# Patient Record
Sex: Female | Born: 1965 | Race: Black or African American | Hispanic: No | Marital: Single | Smoking: Current every day smoker
Health system: Southern US, Community
[De-identification: ages and names within clinical notes are randomized; demographics above are authoritative.]

## PROBLEM LIST (undated history)

## (undated) DIAGNOSIS — E042 Nontoxic multinodular goiter: Secondary | ICD-10-CM

## (undated) DIAGNOSIS — F329 Major depressive disorder, single episode, unspecified: Secondary | ICD-10-CM

## (undated) DIAGNOSIS — D649 Anemia, unspecified: Secondary | ICD-10-CM

## (undated) DIAGNOSIS — F32A Depression, unspecified: Secondary | ICD-10-CM

## (undated) DIAGNOSIS — J45909 Unspecified asthma, uncomplicated: Secondary | ICD-10-CM

## (undated) DIAGNOSIS — F419 Anxiety disorder, unspecified: Secondary | ICD-10-CM

## (undated) DIAGNOSIS — M797 Fibromyalgia: Secondary | ICD-10-CM

## (undated) DIAGNOSIS — Z923 Personal history of irradiation: Secondary | ICD-10-CM

## (undated) DIAGNOSIS — G459 Transient cerebral ischemic attack, unspecified: Secondary | ICD-10-CM

## (undated) DIAGNOSIS — M1990 Unspecified osteoarthritis, unspecified site: Secondary | ICD-10-CM

## (undated) DIAGNOSIS — E0500 Thyrotoxicosis with diffuse goiter without thyrotoxic crisis or storm: Secondary | ICD-10-CM

## (undated) DIAGNOSIS — M7918 Myalgia, other site: Secondary | ICD-10-CM

## (undated) DIAGNOSIS — K219 Gastro-esophageal reflux disease without esophagitis: Secondary | ICD-10-CM

## (undated) DIAGNOSIS — T7840XA Allergy, unspecified, initial encounter: Secondary | ICD-10-CM

## (undated) HISTORY — DX: Unspecified osteoarthritis, unspecified site: M19.90

## (undated) HISTORY — PX: HYDRADENITIS EXCISION: SHX5243

## (undated) HISTORY — DX: Major depressive disorder, single episode, unspecified: F32.9

## (undated) HISTORY — DX: Depression, unspecified: F32.A

## (undated) HISTORY — DX: Nontoxic multinodular goiter: E04.2

## (undated) HISTORY — DX: Allergy, unspecified, initial encounter: T78.40XA

## (undated) HISTORY — DX: Fibromyalgia: M79.7

## (undated) HISTORY — DX: Myalgia, other site: M79.18

---

## 1993-06-24 HISTORY — PX: TUBAL LIGATION: SHX77

## 1999-06-25 HISTORY — PX: AXILLARY LYMPH NODE DISSECTION: SHX5229

## 2005-06-28 ENCOUNTER — Emergency Department (HOSPITAL_COMMUNITY): Admission: EM | Admit: 2005-06-28 | Discharge: 2005-06-28 | Payer: Self-pay | Admitting: Emergency Medicine

## 2005-09-30 ENCOUNTER — Emergency Department (HOSPITAL_COMMUNITY): Admission: EM | Admit: 2005-09-30 | Discharge: 2005-10-01 | Payer: Self-pay | Admitting: Emergency Medicine

## 2006-01-14 ENCOUNTER — Emergency Department (HOSPITAL_COMMUNITY): Admission: EM | Admit: 2006-01-14 | Discharge: 2006-01-14 | Payer: Self-pay | Admitting: Emergency Medicine

## 2006-03-26 ENCOUNTER — Encounter: Admission: RE | Admit: 2006-03-26 | Discharge: 2006-03-26 | Payer: Self-pay | Admitting: Endocrinology

## 2006-09-20 ENCOUNTER — Emergency Department (HOSPITAL_COMMUNITY): Admission: EM | Admit: 2006-09-20 | Discharge: 2006-09-20 | Payer: Self-pay | Admitting: Emergency Medicine

## 2007-02-03 ENCOUNTER — Emergency Department (HOSPITAL_COMMUNITY): Admission: EM | Admit: 2007-02-03 | Discharge: 2007-02-03 | Payer: Self-pay | Admitting: Emergency Medicine

## 2007-10-27 ENCOUNTER — Emergency Department (HOSPITAL_COMMUNITY): Admission: EM | Admit: 2007-10-27 | Discharge: 2007-10-27 | Payer: Self-pay | Admitting: Emergency Medicine

## 2008-01-11 ENCOUNTER — Emergency Department (HOSPITAL_COMMUNITY): Admission: EM | Admit: 2008-01-11 | Discharge: 2008-01-11 | Payer: Self-pay | Admitting: Emergency Medicine

## 2008-01-25 ENCOUNTER — Encounter: Admission: RE | Admit: 2008-01-25 | Discharge: 2008-01-25 | Payer: Self-pay | Admitting: Family Medicine

## 2008-02-24 ENCOUNTER — Emergency Department (HOSPITAL_COMMUNITY): Admission: EM | Admit: 2008-02-24 | Discharge: 2008-02-24 | Payer: Self-pay | Admitting: Emergency Medicine

## 2008-03-28 ENCOUNTER — Encounter: Admission: RE | Admit: 2008-03-28 | Discharge: 2008-06-01 | Payer: Self-pay | Admitting: Family Medicine

## 2008-05-03 ENCOUNTER — Emergency Department (HOSPITAL_COMMUNITY): Admission: EM | Admit: 2008-05-03 | Discharge: 2008-05-04 | Payer: Self-pay | Admitting: Emergency Medicine

## 2008-09-16 ENCOUNTER — Emergency Department (HOSPITAL_COMMUNITY): Admission: EM | Admit: 2008-09-16 | Discharge: 2008-09-16 | Payer: Self-pay | Admitting: Family Medicine

## 2008-09-17 ENCOUNTER — Emergency Department (HOSPITAL_COMMUNITY): Admission: EM | Admit: 2008-09-17 | Discharge: 2008-09-17 | Payer: Self-pay | Admitting: Family Medicine

## 2008-09-22 ENCOUNTER — Encounter (INDEPENDENT_AMBULATORY_CARE_PROVIDER_SITE_OTHER): Payer: Self-pay | Admitting: *Deleted

## 2008-09-22 ENCOUNTER — Ambulatory Visit: Payer: Self-pay | Admitting: Family Medicine

## 2008-09-22 DIAGNOSIS — F39 Unspecified mood [affective] disorder: Secondary | ICD-10-CM | POA: Insufficient documentation

## 2008-09-22 DIAGNOSIS — E119 Type 2 diabetes mellitus without complications: Secondary | ICD-10-CM | POA: Insufficient documentation

## 2008-09-22 LAB — CONVERTED CEMR LAB: Hgb A1c MFr Bld: 10.1 %

## 2008-09-26 ENCOUNTER — Encounter: Payer: Self-pay | Admitting: Family Medicine

## 2008-09-26 ENCOUNTER — Ambulatory Visit: Payer: Self-pay | Admitting: Family Medicine

## 2008-09-27 ENCOUNTER — Ambulatory Visit: Payer: Self-pay | Admitting: Family Medicine

## 2008-09-27 DIAGNOSIS — E669 Obesity, unspecified: Secondary | ICD-10-CM | POA: Insufficient documentation

## 2008-09-27 DIAGNOSIS — E6609 Other obesity due to excess calories: Secondary | ICD-10-CM | POA: Insufficient documentation

## 2008-09-28 ENCOUNTER — Encounter: Payer: Self-pay | Admitting: Family Medicine

## 2008-09-28 LAB — CONVERTED CEMR LAB
ALT: 11 units/L (ref 0–35)
AST: 10 units/L (ref 0–37)
Albumin: 3.6 g/dL (ref 3.5–5.2)
Alkaline Phosphatase: 98 units/L (ref 39–117)
BUN: 16 mg/dL (ref 6–23)
CO2: 21 meq/L (ref 19–32)
Calcium: 8.8 mg/dL (ref 8.4–10.5)
Chloride: 107 meq/L (ref 96–112)
Cholesterol: 135 mg/dL (ref 0–200)
Creatinine, Ser: 0.8 mg/dL (ref 0.40–1.20)
Free T4: 0.94 ng/dL (ref 0.89–1.80)
Glucose, Bld: 211 mg/dL — ABNORMAL HIGH (ref 70–99)
HDL: 41 mg/dL (ref >39)
LDL Cholesterol: 62 mg/dL (ref 0–99)
Potassium: 4.1 meq/L (ref 3.5–5.3)
Sodium: 139 meq/L (ref 135–145)
T3, Free: 2.7 pg/mL (ref 2.3–4.2)
TSH: 0.832 microintl units/mL (ref 0.350–4.500)
Total Bilirubin: 0.3 mg/dL (ref 0.3–1.2)
Total CHOL/HDL Ratio: 3.3
Total Protein: 6.3 g/dL (ref 6.0–8.3)
Triglycerides: 160 mg/dL — ABNORMAL HIGH (ref <150)
VLDL: 32 mg/dL (ref 0–40)

## 2008-10-10 ENCOUNTER — Ambulatory Visit: Payer: Self-pay | Admitting: Family Medicine

## 2008-10-10 ENCOUNTER — Encounter: Payer: Self-pay | Admitting: *Deleted

## 2008-10-12 ENCOUNTER — Encounter: Admission: RE | Admit: 2008-10-12 | Discharge: 2009-01-10 | Payer: Self-pay | Admitting: Emergency Medicine

## 2008-10-21 ENCOUNTER — Telehealth (INDEPENDENT_AMBULATORY_CARE_PROVIDER_SITE_OTHER): Payer: Self-pay | Admitting: *Deleted

## 2008-10-24 ENCOUNTER — Ambulatory Visit: Payer: Self-pay | Admitting: Family Medicine

## 2008-11-01 ENCOUNTER — Telehealth: Payer: Self-pay | Admitting: Family Medicine

## 2008-12-18 ENCOUNTER — Emergency Department (HOSPITAL_COMMUNITY): Admission: EM | Admit: 2008-12-18 | Discharge: 2008-12-18 | Payer: Self-pay | Admitting: Family Medicine

## 2008-12-28 ENCOUNTER — Ambulatory Visit: Payer: Self-pay | Admitting: Family Medicine

## 2008-12-28 LAB — CONVERTED CEMR LAB: Hgb A1c MFr Bld: 6.4 %

## 2009-01-06 ENCOUNTER — Telehealth: Payer: Self-pay | Admitting: *Deleted

## 2009-01-09 ENCOUNTER — Ambulatory Visit: Payer: Self-pay | Admitting: Family Medicine

## 2009-01-09 DIAGNOSIS — M549 Dorsalgia, unspecified: Secondary | ICD-10-CM | POA: Insufficient documentation

## 2009-01-10 ENCOUNTER — Encounter: Admission: RE | Admit: 2009-01-10 | Discharge: 2009-04-10 | Payer: Self-pay | Admitting: Emergency Medicine

## 2009-02-03 ENCOUNTER — Encounter: Payer: Self-pay | Admitting: Family Medicine

## 2009-03-06 ENCOUNTER — Ambulatory Visit: Payer: Self-pay | Admitting: Family Medicine

## 2009-03-11 ENCOUNTER — Encounter (INDEPENDENT_AMBULATORY_CARE_PROVIDER_SITE_OTHER): Payer: Self-pay | Admitting: *Deleted

## 2009-03-11 DIAGNOSIS — F17200 Nicotine dependence, unspecified, uncomplicated: Secondary | ICD-10-CM | POA: Insufficient documentation

## 2009-03-14 ENCOUNTER — Ambulatory Visit: Payer: Self-pay | Admitting: Family Medicine

## 2009-03-22 ENCOUNTER — Emergency Department (HOSPITAL_COMMUNITY): Admission: EM | Admit: 2009-03-22 | Discharge: 2009-03-23 | Payer: Self-pay | Admitting: Emergency Medicine

## 2009-03-24 ENCOUNTER — Telehealth: Payer: Self-pay | Admitting: Family Medicine

## 2009-03-28 ENCOUNTER — Ambulatory Visit: Payer: Self-pay | Admitting: Family Medicine

## 2009-03-28 ENCOUNTER — Encounter: Payer: Self-pay | Admitting: *Deleted

## 2009-05-09 ENCOUNTER — Ambulatory Visit: Payer: Self-pay | Admitting: Family Medicine

## 2009-05-09 LAB — CONVERTED CEMR LAB: Hgb A1c MFr Bld: 6.2 %

## 2009-05-24 ENCOUNTER — Ambulatory Visit: Payer: Self-pay | Admitting: Family Medicine

## 2009-07-21 ENCOUNTER — Emergency Department (HOSPITAL_COMMUNITY): Admission: EM | Admit: 2009-07-21 | Discharge: 2009-07-21 | Payer: Self-pay | Admitting: Emergency Medicine

## 2009-09-08 ENCOUNTER — Encounter: Payer: Self-pay | Admitting: Family Medicine

## 2009-09-08 ENCOUNTER — Ambulatory Visit: Payer: Self-pay | Admitting: Family Medicine

## 2009-09-08 DIAGNOSIS — M25512 Pain in left shoulder: Secondary | ICD-10-CM | POA: Insufficient documentation

## 2009-09-08 LAB — CONVERTED CEMR LAB: Hgb A1c MFr Bld: 6.1 %

## 2009-09-22 ENCOUNTER — Encounter: Payer: Self-pay | Admitting: Family Medicine

## 2009-09-22 ENCOUNTER — Ambulatory Visit: Payer: Self-pay | Admitting: Family Medicine

## 2009-09-22 DIAGNOSIS — E049 Nontoxic goiter, unspecified: Secondary | ICD-10-CM | POA: Insufficient documentation

## 2009-09-22 DIAGNOSIS — J309 Allergic rhinitis, unspecified: Secondary | ICD-10-CM | POA: Insufficient documentation

## 2009-09-22 LAB — CONVERTED CEMR LAB: TSH: 1.047 microintl units/mL (ref 0.350–4.500)

## 2009-09-28 ENCOUNTER — Ambulatory Visit (HOSPITAL_COMMUNITY): Admission: RE | Admit: 2009-09-28 | Discharge: 2009-09-28 | Payer: Self-pay | Admitting: Family Medicine

## 2009-10-18 ENCOUNTER — Telehealth: Payer: Self-pay | Admitting: Family Medicine

## 2009-10-19 ENCOUNTER — Ambulatory Visit: Payer: Self-pay | Admitting: Family Medicine

## 2009-10-26 ENCOUNTER — Ambulatory Visit: Payer: Self-pay | Admitting: Family Medicine

## 2009-11-08 ENCOUNTER — Telehealth: Payer: Self-pay | Admitting: Family Medicine

## 2009-11-09 ENCOUNTER — Ambulatory Visit: Payer: Self-pay | Admitting: Family Medicine

## 2009-11-15 ENCOUNTER — Encounter: Payer: Self-pay | Admitting: Family Medicine

## 2009-11-30 ENCOUNTER — Encounter: Payer: Self-pay | Admitting: Family Medicine

## 2009-12-07 ENCOUNTER — Ambulatory Visit: Payer: Self-pay | Admitting: Family Medicine

## 2009-12-07 ENCOUNTER — Encounter: Payer: Self-pay | Admitting: Family Medicine

## 2009-12-07 ENCOUNTER — Telehealth: Payer: Self-pay | Admitting: Family Medicine

## 2009-12-07 DIAGNOSIS — M79609 Pain in unspecified limb: Secondary | ICD-10-CM | POA: Insufficient documentation

## 2009-12-07 DIAGNOSIS — M2550 Pain in unspecified joint: Secondary | ICD-10-CM | POA: Insufficient documentation

## 2009-12-07 LAB — CONVERTED CEMR LAB
ALT: 11 units/L (ref 0–35)
AST: 10 units/L (ref 0–37)
Albumin: 4.1 g/dL (ref 3.5–5.2)
Alkaline Phosphatase: 90 units/L (ref 39–117)
BUN: 14 mg/dL (ref 6–23)
CO2: 28 meq/L (ref 19–32)
Calcium: 9.1 mg/dL (ref 8.4–10.5)
Chloride: 104 meq/L (ref 96–112)
Cholesterol: 129 mg/dL (ref 0–200)
Creatinine, Ser: 0.93 mg/dL (ref 0.40–1.20)
Glucose, Bld: 115 mg/dL — ABNORMAL HIGH (ref 70–99)
HDL: 45 mg/dL (ref >39)
Hgb A1c MFr Bld: 6.3 %
LDL Cholesterol: 62 mg/dL (ref 0–99)
Potassium: 4.8 meq/L (ref 3.5–5.3)
Sodium: 139 meq/L (ref 135–145)
Total Bilirubin: 0.2 mg/dL — ABNORMAL LOW (ref 0.3–1.2)
Total CHOL/HDL Ratio: 2.9
Total Protein: 7.2 g/dL (ref 6.0–8.3)
Triglycerides: 110 mg/dL (ref <150)
Uric Acid, Serum: 5.7 mg/dL (ref 2.4–7.0)
VLDL: 22 mg/dL (ref 0–40)

## 2009-12-08 ENCOUNTER — Encounter: Payer: Self-pay | Admitting: Family Medicine

## 2009-12-26 ENCOUNTER — Encounter: Admission: RE | Admit: 2009-12-26 | Discharge: 2009-12-26 | Payer: Self-pay | Admitting: Family Medicine

## 2009-12-26 ENCOUNTER — Ambulatory Visit: Payer: Self-pay | Admitting: Family Medicine

## 2009-12-26 ENCOUNTER — Telehealth: Payer: Self-pay | Admitting: Family Medicine

## 2009-12-27 ENCOUNTER — Telehealth: Payer: Self-pay | Admitting: Family Medicine

## 2009-12-28 ENCOUNTER — Telehealth: Payer: Self-pay | Admitting: Family Medicine

## 2010-01-03 ENCOUNTER — Telehealth: Payer: Self-pay | Admitting: Family Medicine

## 2010-01-18 ENCOUNTER — Ambulatory Visit: Payer: Self-pay | Admitting: Family Medicine

## 2010-01-18 ENCOUNTER — Telehealth: Payer: Self-pay | Admitting: Family Medicine

## 2010-03-19 ENCOUNTER — Encounter: Payer: Self-pay | Admitting: Family Medicine

## 2010-03-30 ENCOUNTER — Ambulatory Visit: Payer: Self-pay | Admitting: Family Medicine

## 2010-05-04 ENCOUNTER — Ambulatory Visit: Payer: Self-pay | Admitting: Family Medicine

## 2010-05-04 ENCOUNTER — Encounter: Payer: Self-pay | Admitting: *Deleted

## 2010-05-04 DIAGNOSIS — G43709 Chronic migraine without aura, not intractable, without status migrainosus: Secondary | ICD-10-CM | POA: Insufficient documentation

## 2010-06-01 ENCOUNTER — Ambulatory Visit: Payer: Self-pay | Admitting: Family Medicine

## 2010-06-01 LAB — CONVERTED CEMR LAB: Hgb A1c MFr Bld: 8.4 %

## 2010-07-24 NOTE — Assessment & Plan Note (Signed)
Summary: back / flank pain   Vital Signs:  Patient profile:   45 year old female Height:      63.5 inches Weight:      195.7 pounds BMI:     34.25 Temp:     98.2 degrees F oral Pulse rate:   96 / minute BP sitting:   133 / 83  (left arm) Cuff size:   large  Vitals Entered By: Garen Grams LPN (January 09, 2009 9:36 AM) CC: back pain / flank pain Pain Assessment Patient in pain? yes     Location: back   Primary Care Provider:  Ancil Boozer  MD  CC:  back pain / flank pain.  History of Present Illness: 45yr old presents with:  1) R back pain - c/o chronic back pain. Was improved over the last few weeks but worsened last week. Usually takes muscles relaxants but had stopped because she was doing well.  Started back to 5/10 last week in her same spot, R lumbar region. No sciatica. No leg weakness.  No fevers. no dysuria. Today, is slightly better. 4/10 and feels like her typical pain. has lost 16lbs by walking and changing her diet.   2) R flank pain - Reports constipation all last week and R flank pain. No vomiting. No fevers. Was taking Vicodin for back pain as above.  Saturday had a few alcoholic drinks and "emptied her colon" and immediately felt better. No blood.  No flank pain since.  No abd pain, diarrhea, of nausea today.   Current Medications (verified): 1)  Topamax 25 Mg Tabs (Topiramate) .Marland Kitchen.. 1 Tablet By Mouth Three Times A Day 2)  Methocarbamol 750 Mg Tabs (Methocarbamol) .Marland Kitchen.. 1 Tablet By Mouth Three Times A Day 3)  Nabumetone 500 Mg Tabs (Nabumetone) .Marland Kitchen.. 1 Tablet 2-3 Times Daily With Food 4)  Fexofenadine Hcl 180 Mg Tabs (Fexofenadine Hcl) .Marland Kitchen.. 1 Tablet By Mouth Once Daily 5)  Promethazine Hcl 25 Mg Tabs (Promethazine Hcl) .Marland Kitchen.. 1 Tablet By Mouth As Needed 6)  Alprazolam 0.5 Mg Tabs (Alprazolam) .Marland Kitchen.. 1 Tablet By Mouth As Needed 7)  Metformin Hcl 500 Mg Tabs (Metformin Hcl) .... Titrate Up As Directed To Goal 2 At [facility] and 2 At Midnight For Diabetes 8)  Nexium 40 Mg  Cpdr (Esomeprazole Magnesium) .... Take 1 Tab Each Morning 9)  Prodigy Pocket Blood Glucose W/device Kit (Blood Glucose Monitoring Suppl) .... Disp #1 Glucometer Kit.  Please Also Dispense Lancets #100 For Kit and Glucose Test Strips For Testing Once Daily, 1 Mo Supply. 10)  Oxycodone-Acetaminophen 5-325 Mg Tabs (Oxycodone-Acetaminophen) .Marland Kitchen.. 1 By Mouth Two Times A Day As Needed Severe Pain 11)  Proair Hfa 108 (90 Base) Mcg/act Aers (Albuterol Sulfate) .... 2 Puffs Every 4 Hours As Needed For Wheezing/asthma Flare 12)  Promethazine Hcl 25 Mg Tabs (Promethazine Hcl) .... 1/2 - 1 By Mouth Q8hr As Needed Nausea.  Allergies (verified): No Known Drug Allergies  Physical Exam  General:  obese, cooperative, NAD, easily gets on and off exam today  Mouth:  moist mucous membranes Abdomen:  +BS, obese, NTND, no masses Msk:  no midline ttp. mild R lumbar paraspinous tightness with tenderness. no buttocks tenderness. Neg SLR bilaterally. FROM hips B. FROM of back. normal sensation in legs. 2+ reflexes patellar and achilles   Impression & Recommendations:  Problem # 1:  LOW BACK PAIN, CHRONIC (ICD-724.2) Assessment Unchanged  Has returned to her baseline. Likely exacerbated last week by constipation and immobility. See instructions. No  red flags on exam. Red flags given.  Her updated medication list for this problem includes:    Methocarbamol 750 Mg Tabs (Methocarbamol) .Marland Kitchen... 1 tablet by mouth three times a day    Nabumetone 500 Mg Tabs (Nabumetone) .Marland Kitchen... 1 tablet 2-3 times daily with food    Oxycodone-acetaminophen 5-325 Mg Tabs (Oxycodone-acetaminophen) .Marland Kitchen... 1 by mouth two times a day as needed severe pain  Orders: [facility]- Est Level  3 (99213)  Problem # 2:  CONSTIPATION (ICD-564.00) Assessment: New  Improving. Advised fruits/veggies, push fluids, yogurt, stool softner, and watch narcotic use. See instructions.   Orders: [facility]- Est Level  3 (19147)  Complete Medication List: 1)  Topamax 25  Mg Tabs (Topiramate) .Marland Kitchen.. 1 tablet by mouth three times a day 2)  Methocarbamol 750 Mg Tabs (Methocarbamol) .Marland Kitchen.. 1 tablet by mouth three times a day 3)  Nabumetone 500 Mg Tabs (Nabumetone) .Marland Kitchen.. 1 tablet 2-3 times daily with food 4)  Fexofenadine Hcl 180 Mg Tabs (Fexofenadine hcl) .Marland Kitchen.. 1 tablet by mouth once daily 5)  Promethazine Hcl 25 Mg Tabs (Promethazine hcl) .Marland Kitchen.. 1 tablet by mouth as needed 6)  Alprazolam 0.5 Mg Tabs (Alprazolam) .Marland Kitchen.. 1 tablet by mouth as needed 7)  Metformin Hcl 500 Mg Tabs (Metformin hcl) .... Titrate up as directed to goal 2 at noon and 2 at midnight for diabetes 8)  Nexium 40 Mg Cpdr (Esomeprazole magnesium) .... Take 1 tab each morning 9)  Prodigy Pocket Blood Glucose W/device Kit (Blood glucose monitoring suppl) .... Disp #1 glucometer kit.  please also dispense lancets #100 for kit and glucose test strips for testing once daily, 1 mo supply. 10)  Oxycodone-acetaminophen 5-325 Mg Tabs (Oxycodone-acetaminophen) .Marland Kitchen.. 1 by mouth two times a day as needed severe pain 11)  Proair Hfa 108 (90 Base) Mcg/act Aers (Albuterol sulfate) .... 2 puffs every 4 hours as needed for wheezing/asthma flare 12)  Promethazine Hcl 25 Mg Tabs (Promethazine hcl) .... 1/2 - 1 by mouth q8hr as needed nausea.  Patient Instructions: 1)  When you take the Vicodin, drink an extra large glass of water and take your Dulcolax with it.  2)  Keep walking. I'm SO impressed with your weight loss. Keep it up. 3)  If your belly pain comes back, let us know. 4)  Daily yogurt will help regulate your bowels.

## 2010-07-24 NOTE — Assessment & Plan Note (Signed)
Summary: FU/KH   Vital Signs:  Patient profile:   45 year old female Height:      63.5 inches Weight:      196.9 pounds BMI:     34.46 Temp:     98.1 degrees F oral Pulse rate:   83 / minute BP sitting:   121 / 78  (right arm) Cuff size:   large  Vitals Entered By: Garen Grams LPN (December 28, 1189 2:11 PM) CC: f/u dm, tooth pain   Primary Care Provider:  Ancil Boozer  MD  CC:  f/u dm and tooth pain.  History of Present Illness: DM: has been working very hard to lose weight (down 15 lbs) and following nutrition recommendations.  actually stopped taking glyburide as it made her feel poorly but is taking metformin as prescribed.  is watching her feet as well.  she states overall she is feeling better.  she doesn't check her sugars very regularly but hasn't noticed anything >200 or <80. she is very excited about her improvement in her A1C.  she knows she is due for eye doctor appt given her diabetes.  tooth pain:  has had chronic issues but had difficulty getting in with dentist 2/2 insurance reasons and costs.  for past several weeks has had severe pain in right upper and lower molars 2/2 cavities.  no swelling or fevers or drainage.  has been taking some old leftover percocet from orthopedist and just ran out 1.5 weeks ago.  would like a refill to try to help her pain until she can get in to see a dentist.  Current Medications (verified): 1)  Topamax 25 Mg Tabs (Topiramate) .Marland Kitchen.. 1 Tablet By Mouth Three Times A Day 2)  Methocarbamol 750 Mg Tabs (Methocarbamol) .Marland Kitchen.. 1 Tablet By Mouth Three Times A Day 3)  Nabumetone 500 Mg Tabs (Nabumetone) .Marland Kitchen.. 1 Tablet 2-3 Times Daily With Food 4)  Fexofenadine Hcl 180 Mg Tabs (Fexofenadine Hcl) .Marland Kitchen.. 1 Tablet By Mouth Once Daily 5)  Promethazine Hcl 25 Mg Tabs (Promethazine Hcl) .Marland Kitchen.. 1 Tablet By Mouth As Needed 6)  Alprazolam 0.5 Mg Tabs (Alprazolam) .Marland Kitchen.. 1 Tablet By Mouth As Needed 7)  Metformin Hcl 500 Mg Tabs (Metformin Hcl) .... Titrate Up As  Directed To Goal 2 At [facility] and 2 At Midnight For Diabetes 8)  Nexium 40 Mg Cpdr (Esomeprazole Magnesium) .... Take 1 Tab Each Morning 9)  Prodigy Pocket Blood Glucose W/device Kit (Blood Glucose Monitoring Suppl) .... Disp #1 Glucometer Kit.  Please Also Dispense Lancets #100 For Kit and Glucose Test Strips For Testing Once Daily, 1 Mo Supply. 10)  Oxycodone-Acetaminophen 5-325 Mg Tabs (Oxycodone-Acetaminophen) .Marland Kitchen.. 1 By Mouth Two Times A Day As Needed Severe Pain 11)  Proair Hfa 108 (90 Base) Mcg/act Aers (Albuterol Sulfate) .... 2 Puffs Every 4 Hours As Needed For Wheezing/asthma Flare  Allergies (verified): No Known Drug Allergies  Past History:  Past medical, surgical, family and social histories (including risk factors) reviewed for relevance to current acute and chronic problems.  Past Medical History: Reviewed history from 09/26/2008 and no changes required. 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 Tubal ligation-1995 Axillary sweat glands and lymph nodes removed bilaterally 2001  Family History: Reviewed history from 09/26/2008 and no changes required. Rectal Cancer - mother Family History Breast cancer 1st degree relative <50- sister age 25 Family History of CAD Female 1st degree relative <50- father MI x5  Family History Diabetes 1st degree relative- parents and sister family history of hypertenion  Social History: Reviewed history from 09/26/2008 and no changes required. Pt lives with her daughter and son ages 84 and 40, and grandson age 45.  She is currently  unemployed.  Enjoys reading.  [place]iginally from [place].  moved here to [place] in 2007.  disabled 2/2 depression/anxiety  Review of Systems       per HPI.    Physical Exam  General:  obese, cooperative, NAD Mouth:  poor dentition with significant dental work in the past.  cavities and breakdown noted on lower and upper molars on R side.  no signs of infection at this time. Lungs:  Normal respiratory effort, chest expands symmetrically. Lungs are clear to auscultation, no crackles or wheezes. Heart:  Normal rate and regular rhythm. S1 and S2 normal without gallop, murmur, click, rub or other extra sounds.   Impression & Recommendations:  Problem # 1:  DIABETES MELLITUS, TYPE II (ICD-250.00) Assessment Improved continue current treatment as we have made excellent progress.  optho referral  The following medications were removed from the medication list:    Glyburide 5 Mg Tabs (Glyburide) .Marland Kitchen... 1/2 tablet by mouth once daily for diabetes Her updated medication list for this problem includes:    Metformin Hcl 500 Mg Tabs (Metformin hcl) .Marland Kitchen... Titrate up as directed to goal 2 at noon and 2 at midnight for diabetes  Orders: A1C-[facility] (16109) Ophthalmology Referral (Ophthalmology) [facility]- Est  Level 4 (60454)  Problem # 2:  DENTAL CARIES (ICD-521.00) Assessment: Deteriorated  needs dentist appt ultimately.  no signs of infection currently.  given script for pain medication until then.  Orders: [facility]- Est  Level 4 (09811)  Complete Medication List: 1)  Topamax 25 Mg Tabs (Topiramate) .Marland Kitchen.. 1 tablet by mouth three times a day 2)  Methocarbamol 750 Mg Tabs (Methocarbamol) .Marland Kitchen.. 1 tablet by mouth three times a day 3)  Nabumetone 500 Mg Tabs (Nabumetone) .Marland Kitchen.. 1 tablet 2-3 times daily with food 4)  Fexofenadine Hcl 180 Mg Tabs (Fexofenadine hcl) .Marland Kitchen.. 1 tablet by mouth once daily 5)  Promethazine Hcl 25 Mg Tabs (Promethazine hcl) .Marland Kitchen.. 1 tablet by mouth as needed 6)  Alprazolam 0.5 Mg Tabs  (Alprazolam) .Marland Kitchen.. 1 tablet by mouth as needed 7)  Metformin Hcl 500 Mg Tabs (Metformin hcl) .... Titrate up as directed to goal 2 at noon and 2 at midnight for diabetes 8)  Nexium 40 Mg Cpdr (Esomeprazole magnesium) .... Take 1 tab each morning 9)  Prodigy Pocket Blood Glucose W/device Kit (Blood glucose monitoring suppl) .... Disp #1 glucometer kit.  please also dispense lancets #100 for kit and glucose test strips for testing once daily, 1 mo supply. 10)  Oxycodone-acetaminophen 5-325 Mg Tabs (Oxycodone-acetaminophen) .Marland Kitchen.. 1 by mouth two times a day as needed severe pain 11)  Proair Hfa 108 (90 Base) Mcg/act Aers (Albuterol sulfate) .... 2 puffs every 4 hours as needed for wheezing/asthma flare  Patient Instructions: 1)  Please follow up in 3 months or of course sooner if needed. 2)  Unfortunately you are just going to have to call around for a dentist to see if they take  Medicare here in town as we don't have a specific list.  Until then we can get you some pain medication for your tooth. 3)  I am very proud of you with your weight loss and with your improvement in your diabetes!  Keep up the good work! Your A1C today was 6.4%!!!!!! 4)  We are going to put in a referral to an eye doctor for your diabetic eye exam. Prescriptions: PROAIR HFA 108 (90 BASE) MCG/ACT AERS (ALBUTEROL SULFATE) 2 puffs every 4 hours as needed for wheezing/asthma flare  #1 x 11   Entered and Authorized by:   Ancil Boozer  MD   Signed by:   Ancil Boozer  MD on 12/28/2008   Method used:   Electronically to        [facility] [phone]* (retail)       [address]       [place], [place]  [address]       Ph: [phone] or [phone]       Fax: [phone]   RxID:   [id number] OXYCODONE-ACETAMINOPHEN 5-325 MG TABS (OXYCODONE-ACETAMINOPHEN) 1 by mouth two times a day as needed severe pain  #60 x 0   Entered and Authorized by:   Ancil Boozer  MD   Signed by:   Ancil Boozer  MD on  12/28/2008   Method used:   Print then Give to Patient   RxID:   [id number]   Laboratory Results   Blood Tests   Date/Time Received: December 28, 2008 2:10 PM  Date/Time Reported: December 28, 2008 2:25 PM   HGBA1C: 6.4%   (Normal Range: Non-Diabetic - 3-6%   Control Diabetic - 6-8%)  Comments: ...........test performed by...........Marland KitchenTerese Door, CMA

## 2010-07-24 NOTE — Assessment & Plan Note (Signed)
Summary: f/up,tcb   Vital Signs:  Patient profile:   45 year old female Height:      63.5 inches Weight:      201 pounds BMI:     35.17 Temp:     98.8 degrees F Pulse rate:   84 / minute BP sitting:   120 / 81  (left arm) Cuff size:   large  Vitals Entered By: Dennison Nancy RN (January 18, 2010 11:18 AM) CC: Follow up neck and arm pain Is Patient Diabetic? Yes Pain Assessment Patient in pain? yes     Location: right neck/shoulder Intensity: 9 Onset of pain  Chronic   Primary Care Provider:  Bobby Rumpf  MD  CC:  Follow up neck and arm pain.  History of Present Illness: 1) Right shoulder pain: Seen on 12/26/09 for right shoulder pain x 3 days. Pain is located over trapezius and feels like "tight and sore" and "like a big knot". Worse with movement. Radiates up to right side of neck and down right lateral arm. Denies parasthesias or weakness. Neck films showed moderate to severe degenerative disc disease at C5-C6. Pain not relieved by oxycodone or methocarbamol for her chronic back pain - this was switched to flexeril which did not relieve pain. Ice has not helped.   Denies inciting injury, chest pain, dyspnea, parasthesias, weakness.  2) Seasonal allergies: worse in summer and spring. itchy, runny eyes; sneezing, runny nose. worse with pollen. on allegra D in past but unable to afford. on zyrtec in past but unable to afford. denies cough, sore throat, fever, chills, nausea, vomiting.   Med rec see prior meds but - not on skelaxin, not on cetirizine, takes metformin and lisinopril intermittently Allergies reviewed as as below ROS as above otherwise negative except for chrionic back pain at baseline    Habits & Providers  Alcohol-Tobacco-Diet     Alcohol drinks/day: 0     Tobacco Status: current     Tobacco Counseling: to quit use of tobacco products     Cigarette Packs/Day: 0.25  Medications Prior to Update: 1)  Topamax 25 Mg Tabs (Topiramate) .Marland Kitchen.. 1 Tablet By Mouth Three  Times A Day For Headache Prevention 2)  Nabumetone 500 Mg Tabs (Nabumetone) .Marland Kitchen.. 1 Tablet 2-3 Times Daily With Food 3)  Cetirizine-Pseudoephedrine 5-120 Mg Xr12h-Tab (Cetirizine-Pseudoephedrine) .Marland Kitchen.. 1 By Mouth Once Daily For Allergies/congestion 4)  Metformin Hcl 500 Mg Tabs (Metformin Hcl) .... 2 By Mouth Two Times A Day For Diabetes Control. 5)  Nexium 40 Mg Cpdr (Esomeprazole Magnesium) .... Take 1 Tab Each Morning 6)  Prodigy Pocket Blood Glucose W/device Kit (Blood Glucose Monitoring Suppl) .... Disp #1 Glucometer Kit.  Please Also Dispense Lancets #100 For Kit and Glucose Test Strips For Testing Once Daily, 1 Mo Supply. 7)  Oxycodone-Acetaminophen 5-325 Mg Tabs (Oxycodone-Acetaminophen) .Marland Kitchen.. 1 By Mouth Two Times A Day As Needed Severe Pain 8)  Proair Hfa 108 (90 Base) Mcg/act Aers (Albuterol Sulfate) .... 2 Puffs Every 4 Hours As Needed For Wheezing/asthma Flare 9)  Maxalt-Mlt 10 Mg Tbdp (Rizatriptan Benzoate) .... As Directed For Migraine 10)  Fluticasone Propionate 50 Mcg/act Susp (Fluticasone Propionate) .... 2 Sprays Each Nostril Once Daily.  Lean Forward When Using Medicine. 11)  Gabapentin 300 Mg Caps (Gabapentin) .Marland Kitchen.. 1 By Mouth Two Times A Day For Pain 12)  Promethazine Hcl 25 Mg Tabs (Promethazine Hcl) .... 1/2 - 1 By Mouth Three Times A Day As Needed Nausea 13)  Lisinopril 5 Mg Tabs (Lisinopril) .Marland KitchenMarland KitchenMarland Kitchen  1 Tablet Daily For Kidney Protection 14)  Alprazolam 0.5 Mg Tabs (Alprazolam) .Marland Kitchen.. 1 By Mouth Three Times A Day As Needed Severe Anxiety.  May Refill Every 30 Days 15)  Skelaxin 800 Mg Tabs (Metaxalone) .... Take One By Mouth Q6 Hours As Needed Muscle Spasm 16)  Cyclobenzaprine Hcl 5 Mg Tabs (Cyclobenzaprine Hcl) .... One Tab By Mouth Three Times A Day As Needed Pain and Muscle Spasm (If Causes Drowsiness Take At Night Only)  Allergies (verified): No Known Drug Allergies  Physical Exam  General:  overweight, uncomfortable appearing, holds right arm at side with elbow in 90 degree  flexion  Eyes:  no conjunctivitis or abnormal tearing  Nose:  no congestion  Lungs:  Normal respiratory effort, chest expands symmetrically. Lungs are clear to auscultation, no crackles or wheezes. Heart:  Normal rate and regular rhythm. S1 and S2 normal without gallop, murmur, click, rub or other extra sounds. Msk:  right trapezius in spasm with multiple trigger points. full ROM at right shoulderwith pain at trapezius but with negative hawkins, neer's, empty can tests. 5/5 strength bilateral upper extremities in proximal and distal muscle groups  Extremities:  2+ radials  Neurologic:  sensation intact bilateral upper and lower extremities    Impression & Recommendations:  Problem # 1:  SHOULDER PAIN, RIGHT (ICD-719.41) Assessment Deteriorated  Likely secondary to spasm in trapezius - possibly precipitated by mild nerve root impingement with degenerative disc disease on review of films (though no neurological signs). Injected as above. Follow up in one month. Will increase flexeril. Reviewed strengthening / stretching exercises.    The following medications were removed from the medication list:    Skelaxin 800 Mg Tabs (Metaxalone) .Marland Kitchen... Take one by mouth q6 hours as needed muscle spasm Her updated medication list for this problem includes:    Nabumetone 500 Mg Tabs (Nabumetone) .Marland Kitchen... 1 tablet 2-3 times daily with food    Oxycodone-acetaminophen 5-325 Mg Tabs (Oxycodone-acetaminophen) .Marland Kitchen... 1 by mouth two times a day as needed severe pain    Cyclobenzaprine Hcl 10 Mg Tabs (Cyclobenzaprine hcl) ..... One tab by mouth three times a day (take only at night if causes drowsiness)  Problem # 2:  ALLERGIC RHINITIS (ICD-477.9) Assessment: Unchanged  Continue fluticasone. Advised to use over the counter loratadine as needed. Advised to use over the counter eye wetting drops. Follow up one month.  Her updated medication list for this problem includes:    Fluticasone Propionate 50 Mcg/act Susp  (Fluticasone propionate) .Marland Kitchen... 2 sprays each nostril once daily.  lean forward when using medicine.    Promethazine Hcl 25 Mg Tabs (Promethazine hcl) .Marland Kitchen... 1/2 - 1 by mouth three times a day as needed nausea  Complete Medication List: 1)  Topamax 25 Mg Tabs (Topiramate) .Marland Kitchen.. 1 tablet by mouth three times a day for headache prevention 2)  Nabumetone 500 Mg Tabs (Nabumetone) .Marland Kitchen.. 1 tablet 2-3 times daily with food 3)  Metformin Hcl 500 Mg Tabs (Metformin hcl) .... 2 by mouth two times a day for diabetes control. 4)  Nexium 40 Mg Cpdr (Esomeprazole magnesium) .... Take 1 tab each morning 5)  Prodigy Pocket Blood Glucose W/device Kit (Blood glucose monitoring suppl) .... Disp #1 glucometer kit.  please also dispense lancets #100 for kit and glucose test strips for testing once daily, 1 mo supply. 6)  Oxycodone-acetaminophen 5-325 Mg Tabs (Oxycodone-acetaminophen) .Marland Kitchen.. 1 by mouth two times a day as needed severe pain 7)  Proair Hfa 108 (90 Base) Mcg/act Aers (Albuterol  sulfate) .... 2 puffs every 4 hours as needed for wheezing/asthma flare 8)  Maxalt-mlt 10 Mg Tbdp (Rizatriptan benzoate) .... As directed for migraine 9)  Fluticasone Propionate 50 Mcg/act Susp (Fluticasone propionate) .... 2 sprays each nostril once daily.  lean forward when using medicine. 10)  Gabapentin 300 Mg Caps (Gabapentin) .Marland Kitchen.. 1 by mouth two times a day for pain 11)  Promethazine Hcl 25 Mg Tabs (Promethazine hcl) .... 1/2 - 1 by mouth three times a day as needed nausea 12)  Lisinopril 5 Mg Tabs (Lisinopril) .Marland Kitchen.. 1 tablet daily for kidney protection 13)  Alprazolam 0.5 Mg Tabs (Alprazolam) .Marland Kitchen.. 1 by mouth three times a day as needed severe anxiety.  may refill every 30 days 14)  Cyclobenzaprine Hcl 10 Mg Tabs (Cyclobenzaprine hcl) .... One tab by mouth three times a day (take only at night if causes drowsiness)  Patient Instructions: 1)  Do exercises with 3 lb weights as we discussed.  2)  I have refilled your flexeril  (increased dose) and oxycodone 3)  Follow up in one month. 4)  Exercise your shoulder! 5)  Take your metformin and your lisinopril  Prescriptions: OXYCODONE-ACETAMINOPHEN 5-325 MG TABS (OXYCODONE-ACETAMINOPHEN) 1 by mouth two times a day as needed severe pain  #45 x 0   Entered and Authorized by:   Bobby Rumpf  MD   Signed by:   Bobby Rumpf  MD on 01/18/2010   Method used:   Print then Give to Patient   RxID:   [id number] CYCLOBENZAPRINE HCL 10 MG TABS (CYCLOBENZAPRINE HCL) one tab by mouth three times a day (take only at night if causes drowsiness)  #60 x 0   Entered and Authorized by:   Bobby Rumpf  MD   Signed by:   Bobby Rumpf  MD on 01/18/2010   Method used:   Print then Give to Patient   RxID:   [id number]    Procedure Note  Injections: Duration of symptoms: 3 weeks Indication: chronic pain  Procedure # 1: trigger point injection    Region: right trapezius     Technique: 20 g 2' needle    Medication: Triamcinolone 10 mg/mL    Anesthesia: 1% lidocaine w/o epinephrine    Comment: consent obtained, time out performed, three trigger points identified and injected.   Cleaned and prepped with: alcohol Wound dressing: bandaid Additional Instructions: exercises as outlied, RTC one month   Appended Document: Orders Update    Clinical Lists Changes  Orders: Added new Test order of Trigger point injection- [facility] ([id number]) - Signed

## 2010-07-24 NOTE — Letter (Signed)
Summary: Probation Letter  [facility]  [address]   [place], [place] [address]   Phone: [phone]  Fax: [phone]    05/04/2010  Kendra Hall [address] [place], [place]  [address]  Dear Ms. Cassatt,  With the goal of better serving all our patients the [facility] is following each patient's missed appointments.  You have missed at least 3 appointments with our practice.If you cannot keep your appointment, we expect you to call at least 24 hours before your appointment time.  Missing appointments prevents other patients from seeing [place] and makes it difficult to provide you with the best possible medical care.      1.   If you miss one more appointment, we will only give you limited medical services. This means we will not call in medication refills, complete a form, or make a referral for you except when you are here for a scheduled office visit.    2.   If you miss 2 or more appointments in the next year, we will dismiss you from our practice.    Our office staff can be reached at [phone] Monday through Friday from 8:30 a.m.-5:00 p.m. and will be glad to schedule your appointment as necessary.    Thank you.   [facility]

## 2010-07-24 NOTE — Assessment & Plan Note (Signed)
Summary: pt has sore toe and foot,tcb   Vital Signs:  Patient profile:   45 year old female Height:      63.5 inches Weight:      189 pounds BMI:     33.07 BSA:     1.90 Temp:     98.4 degrees F Pulse rate:   76 / minute BP sitting:   121 / 80  Vitals Entered By: Jone Baseman CMA (May 09, 2009 11:27 AM) CC: left foot and toe pain Pain Assessment Patient in pain? yes     Location: left foot Intensity: 3   Primary Care Blaiden Werth:  Ancil Boozer  MD  CC:  left foot and toe pain.  History of Present Illness: 1. L great toe pain Started saturday. Was stiff, hurt to move. Hurt to put in a shoe. Hurt with walking. Pain is better but now has a bruise on inside of foot near great toe. Still hurts with some movements.  Denies any history of trauma. Does not remember any specific injury. No redness or swelling.   ROS: no other joint or muscle aches.   PMHx significant for DM2, chronic back pain, depression  FMHx: grandfather with gout.   Habits & Providers  Alcohol-Tobacco-Diet     Tobacco Status: current     Tobacco Counseling: to quit use of tobacco products     Cigarette Packs/Day: 0.25  Current Medications (verified): 1)  Topamax 25 Mg Tabs (Topiramate) .Marland Kitchen.. 1 Tablet By Mouth Three Times A Day 2)  Methocarbamol 750 Mg Tabs (Methocarbamol) .Marland Kitchen.. 1 Tablet By Mouth Three Times A Day 3)  Nabumetone 500 Mg Tabs (Nabumetone) .Marland Kitchen.. 1 Tablet 2-3 Times Daily With Food 4)  Fexofenadine Hcl 180 Mg Tabs (Fexofenadine Hcl) .Marland Kitchen.. 1 Tablet By Mouth Once Daily 5)  Promethazine Hcl 25 Mg Tabs (Promethazine Hcl) .Marland Kitchen.. 1 Tablet By Mouth As Needed 6)  Alprazolam 0.5 Mg Tabs (Alprazolam) .Marland Kitchen.. 1 Tablet By Mouth As Needed 7)  Metformin Hcl 1000 Mg Tabs (Metformin Hcl) .Marland Kitchen.. 1 By Mouth Two Times A Day For Diabetes 8)  Nexium 40 Mg Cpdr (Esomeprazole Magnesium) .... Take 1 Tab Each Morning 9)  Prodigy Pocket Blood Glucose W/device Kit (Blood Glucose Monitoring Suppl) .... Disp #1 Glucometer  Kit.  Please Also Dispense Lancets #100 For Kit and Glucose Test Strips For Testing Once Daily, 1 Mo Supply. 10)  Oxycodone-Acetaminophen 5-325 Mg Tabs (Oxycodone-Acetaminophen) .Marland Kitchen.. 1 By Mouth Two Times A Day As Needed Severe Pain 11)  Proair Hfa 108 (90 Base) Mcg/act Aers (Albuterol Sulfate) .... 2 Puffs Every 4 Hours As Needed For Wheezing/asthma Flare 12)  Promethazine Hcl 25 Mg Tabs (Promethazine Hcl) .... 1/2 - 1 By Mouth Q8hr As Needed Nausea. 13)  Maxalt-Mlt 10 Mg Tbdp (Rizatriptan Benzoate) .... As Directed For Migraine 14)  Amoxicillin-Pot Clavulanate 875-125 Mg Tabs (Amoxicillin-Pot Clavulanate) .Marland Kitchen.. 1 By Mouth Two Times A Day For 10 Days. 15)  Tussionex Pennkinetic Er 8-10 Mg/15ml Lqcr (Chlorpheniramine-Hydrocodone) .... 5ml By Mouth Two Times A Day As Needed Cough. Disp 10 Day Supply.  Allergies (verified): No Known Drug Allergies  Family History: Rectal Cancer - mother Family History Breast cancer 1st degree relative <50- sister age 45 Family History of CAD Female 1st degree relative <50- father MI x5  Family History Diabetes 1st degree relative- parents and sister family history of hypertenion family history of gout (Grandfather)  Physical Exam  General:  no acute distress, alert. pleasant Msk:  L foot:  stable ankle joint.  Normal dorsi- and plantarflexion of the foot.  normal to inspection. No obvious bruising. No swelling, warmth, or erythema. Tender to palpation over first tarsal bone. No tenderness at MTP or PIP on great toe. Pain with passive and active extension for first tow with mildly limited ROM on extension. Normal flexion.    Impression & Recommendations:  Problem # 1:  TENOSYNOVITIS OF FOOT AND ANKLE (ICD-727.06) Assessment New  likely tendonitis. Will prescribe increasing NSAID frequency, ice, and buddy taping with relative rest. Consider gout but clinical exam does not match. If this returns would consider testing uric acid level. Return parameters  discussed.  Patient/family agreeable. See instructions   Orders: [facility]- Est Level  3 (04540)  Problem # 2:  DIABETES MELLITUS, TYPE II (ICD-250.00) Assessment: Comment Only a1c today.  Her updated medication list for this problem includes:    Metformin Hcl 1000 Mg Tabs (Metformin hcl) .Marland Kitchen... 1 by mouth two times a day for diabetes  Orders: A1C-[facility] (98119)  Complete Medication List: 1)  Topamax 25 Mg Tabs (Topiramate) .Marland Kitchen.. 1 tablet by mouth three times a day 2)  Methocarbamol 750 Mg Tabs (Methocarbamol) .Marland Kitchen.. 1 tablet by mouth three times a day 3)  Nabumetone 500 Mg Tabs (Nabumetone) .Marland Kitchen.. 1 tablet 2-3 times daily with food 4)  Fexofenadine Hcl 180 Mg Tabs (Fexofenadine hcl) .Marland Kitchen.. 1 tablet by mouth once daily 5)  Promethazine Hcl 25 Mg Tabs (Promethazine hcl) .Marland Kitchen.. 1 tablet by mouth as needed 6)  Alprazolam 0.5 Mg Tabs (Alprazolam) .Marland Kitchen.. 1 tablet by mouth as needed 7)  Metformin Hcl 1000 Mg Tabs (Metformin hcl) .Marland Kitchen.. 1 by mouth two times a day for diabetes 8)  Nexium 40 Mg Cpdr (Esomeprazole magnesium) .... Take 1 tab each morning 9)  Prodigy Pocket Blood Glucose W/device Kit (Blood glucose monitoring suppl) .... Disp #1 glucometer kit.  please also dispense lancets #100 for kit and glucose test strips for testing once daily, 1 mo supply. 10)  Oxycodone-acetaminophen 5-325 Mg Tabs (Oxycodone-acetaminophen) .Marland Kitchen.. 1 by mouth two times a day as needed severe pain 11)  Proair Hfa 108 (90 Base) Mcg/act Aers (Albuterol sulfate) .... 2 puffs every 4 hours as needed for wheezing/asthma flare 12)  Promethazine Hcl 25 Mg Tabs (Promethazine hcl) .... 1/2 - 1 by mouth q8hr as needed nausea. 13)  Maxalt-mlt 10 Mg Tbdp (Rizatriptan benzoate) .... As directed for migraine 14)  Amoxicillin-pot Clavulanate 875-125 Mg Tabs (Amoxicillin-pot clavulanate) .Marland Kitchen.. 1 by mouth two times a day for 10 days. 15)  Tussionex Pennkinetic Er 8-10 Mg/14ml Lqcr (Chlorpheniramine-hydrocodone) .... 5ml by mouth two times a day as  needed cough. disp 10 day supply.  Patient Instructions: 1)  Ice your toe for 20 minutes two times a day. 2)  Increase your nabumetone to 2-3 times per day. 3)  Buddy wrap your toe for the next week or so.  4)  If the toe gets worse or if you have any concerns, call for a follow-up appointment.   Laboratory Results   Blood Tests   Date/Time Received: May 09, 2009 11:59 AM  Date/Time Reported: May 09, 2009 12:12 PM   HGBA1C: 6.2%   (Normal Range: Non-Diabetic - 3-6%   Control Diabetic - 6-8%)  Comments: ...............test performed by......Marland KitchenBonnie A. [place], MLS (ASCP)cm

## 2010-07-24 NOTE — Assessment & Plan Note (Signed)
Summary: f/u eo   Vital Signs:  Patient profile:   45 year old female Height:      63.5 inches Weight:      206.6 pounds BMI:     36.15 Temp:     98.2 degrees F oral Pulse rate:   94 / minute BP sitting:   120 / 84  (left arm) Cuff size:   large  Vitals Entered By: Garen Grams LPN (May 04, 2010 2:35 PM) CC: discuss migraine  Is Patient Diabetic? No   Primary Care Provider:  Bobby Rumpf  MD  CC:  discuss migraine .  History of Present Illness: 1) Migraine: History of migraine headache. On Topamax for prevention. Maxalt and phenergan for symptom control. Reports 3 migraine headaches in past month. Occipital, pounding, last more than four hours (sometimes all day), occur with nausea w/o emesis and blurry vision. Worse with bright light, loud noises, better with medications as above. Takes Topamax sporadically. Triggers are stress, smells and certain foods.   Denies focal neurological signs, thunderclap, waking from sleep, head trauma.   Habits & Providers  Alcohol-Tobacco-Diet     Alcohol drinks/day: 0     Tobacco Status: current     Tobacco Counseling: to quit use of tobacco products     Cigarette Packs/Day: 0.25  Current Medications (verified): 1)  Topamax 25 Mg Tabs (Topiramate) .Marland Kitchen.. 1 Tablet By Mouth Three Times A Day For Headache Prevention 2)  Nabumetone 500 Mg Tabs (Nabumetone) .Marland Kitchen.. 1 Tablet 2-3 Times Daily With Food 3)  Metformin Hcl 500 Mg Tabs (Metformin Hcl) .... 2 By Mouth Two Times A Day For Diabetes Control. 4)  Nexium 40 Mg Cpdr (Esomeprazole Magnesium) .... Take 1 Tab Each Morning 5)  Prodigy Pocket Blood Glucose W/device Kit (Blood Glucose Monitoring Suppl) .... Disp #1 Glucometer Kit.  Please Also Dispense Lancets #100 For Kit and Glucose Test Strips For Testing Once Daily, 1 Mo Supply. 6)  Oxycodone-Acetaminophen 5-325 Mg Tabs (Oxycodone-Acetaminophen) .Marland Kitchen.. 1 By Mouth Two Times A Day As Needed Severe Pain 7)  Proair Hfa 108 (90 Base) Mcg/act Aers  (Albuterol Sulfate) .... 2 Puffs Every 4 Hours As Needed For Wheezing/asthma Flare 8)  Maxalt-Mlt 10 Mg Tbdp (Rizatriptan Benzoate) .... As Directed For Migraine 9)  Fluticasone Propionate 50 Mcg/act Susp (Fluticasone Propionate) .... 2 Sprays Each Nostril Once Daily.  Lean Forward When Using Medicine. 10)  Gabapentin 300 Mg Caps (Gabapentin) .Marland Kitchen.. 1 By Mouth Two Times A Day For Pain 11)  Promethazine Hcl 25 Mg Tabs (Promethazine Hcl) .... 1/2 - 1 By Mouth Three Times A Day As Needed Nausea 12)  Lisinopril 5 Mg Tabs (Lisinopril) .Marland Kitchen.. 1 Tablet Daily For Kidney Protection 13)  Alprazolam 0.5 Mg Tabs (Alprazolam) .Marland Kitchen.. 1 By Mouth Three Times A Day As Needed Severe Anxiety.  May Refill Every 30 Days 14)  Cyclobenzaprine Hcl 10 Mg Tabs (Cyclobenzaprine Hcl) .... One Tab By Mouth Three Times A Day (Take Only At Night If Causes Drowsiness)  Allergies (verified): No Known Drug Allergies  Physical Exam  General:  overweight, uncomfortable appearing, poor eye contact, NAD  Head:  Normocephalic and atraumatic without obvious abnormalities. No apparent alopecia or balding. Eyes:  pupils equal, round and reactive to light , extraoccular movements intact , no conjunctivitis or abnormal tearing  Mouth:  poor dentition with multiple dental carries. Neurologic:  alert & oriented X3, cranial nerves II-XII intact, strength normal in all extremities, and sensation intact to light touch.     Impression &  Recommendations:  Problem # 1:  MIGRAINE HEADACHE (ICD-346.90) Assessment Deteriorated  Continue medication regimen as outlined. Topamax for prevention. Maxalt and Phenergan for abortive therapy. Advised to take abortive medications early in headache course. Headache diary. Avoid triggers. No red flags noted. Follow up one month for follow up chronic issues.      Oxycodone-acetaminophen 5-325 Mg Tabs (Oxycodone-acetaminophen) .Marland Kitchen... 1 by mouth two times a day as needed severe pain    Maxalt-mlt 10 Mg Tbdp  (Rizatriptan benzoate) .Marland Kitchen... As directed for migraine  Orders: [facility]- Est Level  3 (27253)  Complete Medication List: 1)  Topamax 25 Mg Tabs (Topiramate) .Marland Kitchen.. 1 tablet by mouth three times a day for headache prevention 2)  Nabumetone 500 Mg Tabs (Nabumetone) .Marland Kitchen.. 1 tablet 2-3 times daily with food 3)  Metformin Hcl 500 Mg Tabs (Metformin hcl) .... 2 by mouth two times a day for diabetes control. 4)  Nexium 40 Mg Cpdr (Esomeprazole magnesium) .... Take 1 tab each morning 5)  Prodigy Pocket Blood Glucose W/device Kit (Blood glucose monitoring suppl) .... Disp #1 glucometer kit.  please also dispense lancets #100 for kit and glucose test strips for testing once daily, 1 mo supply. 6)  Oxycodone-acetaminophen 5-325 Mg Tabs (Oxycodone-acetaminophen) .Marland Kitchen.. 1 by mouth two times a day as needed severe pain 7)  Proair Hfa 108 (90 Base) Mcg/act Aers (Albuterol sulfate) .... 2 puffs every 4 hours as needed for wheezing/asthma flare 8)  Maxalt-mlt 10 Mg Tbdp (Rizatriptan benzoate) .... As directed for migraine 9)  Fluticasone Propionate 50 Mcg/act Susp (Fluticasone propionate) .... 2 sprays each nostril once daily.  lean forward when using medicine. 10)  Gabapentin 300 Mg Caps (Gabapentin) .Marland Kitchen.. 1 by mouth two times a day for pain 11)  Promethazine Hcl 25 Mg Tabs (Promethazine hcl) .... 1/2 - 1 by mouth three times a day as needed nausea 12)  Lisinopril 5 Mg Tabs (Lisinopril) .Marland Kitchen.. 1 tablet daily for kidney protection 13)  Alprazolam 0.5 Mg Tabs (Alprazolam) .Marland Kitchen.. 1 by mouth three times a day as needed severe anxiety.  may refill every 30 days 14)  Cyclobenzaprine Hcl 10 Mg Tabs (Cyclobenzaprine hcl) .... One tab by mouth three times a day (take only at night if causes drowsiness)  Patient Instructions: 1)  Take your maxalt and phenergan AS SOON AS you start to get a migraine. 2)  Take your topamax as directed. 3)  Follow up in 1-2 months for your chronic issues. Prescriptions: PROMETHAZINE HCL 25 MG TABS  (PROMETHAZINE HCL) 1/2 - 1 by mouth three times a day as needed nausea  #30 x 1   Entered and Authorized by:   Bobby Rumpf  MD   Signed by:   Bobby Rumpf  MD on 05/04/2010   Method used:   Print then Give to Patient   RxID:   [id number] OXYCODONE-ACETAMINOPHEN 5-325 MG TABS (OXYCODONE-ACETAMINOPHEN) 1 by mouth two times a day as needed severe pain  #45 x 0   Entered and Authorized by:   Bobby Rumpf  MD   Signed by:   Bobby Rumpf  MD on 05/04/2010   Method used:   Print then Give to Patient   RxID:   [id number] MAXALT-MLT 10 MG TBDP (RIZATRIPTAN BENZOATE) as directed for migraine  #9 x 1   Entered and Authorized by:   Bobby Rumpf  MD   Signed by:   Bobby Rumpf  MD on 05/04/2010   Method used:   Print then Give to Patient   RxID:  [id number]    Orders Added: 1)  [facility]- Est Level  3 [26948]

## 2010-07-24 NOTE — Progress Notes (Signed)
Summary: refill  Phone Note Refill Request Call back at Home Phone [phone] Message from:  Patient  Refills Requested: Medication #1:  METFORMIN HCL 500 MG TABS titrate up as directed to goal 2 at noon and 2 at midnight for diabetes pt thinks she should only be taking 1 pill now.    Initial call taken by: De Nurse,  Nov 01, 2008 4:33 PM  Follow-up for Phone Call        Actually, she should be on 1/2 of the glyburide tablet daily and the metformin 1000mg  two times a day please clarify with patient.  metformin will not make her sugars go too low. Follow-up by: Ancil Boozer  MD,  Nov 02, 2008 1:38 PM  Additional Follow-up for Phone Call Additional follow up Details #1::        clarified meds with her. she thought md was going to drop pills & doses. told her last HGBA1C was 10.1 and she may as numbers improve.  discussed diet & avoiding fats as they have more than twice the calories as any other foods. discussed exercise. she is walkinf almost every day and goes to the mall & park to walk as well. has done some exercise while watching tv. encouraged & praised her for efforts. 10 minutes spent educating her on above Additional Follow-up by: Golden Circle RN,  Nov 02, 2008 2:17 PM    Additional Follow-up for Phone Call Additional follow up Details #2::    thanks sally.  i think we are just going to have to continually educate her.  Follow-up by: Ancil Boozer  MD,  Nov 02, 2008 4:19 PM

## 2010-07-24 NOTE — Miscellaneous (Signed)
Summary: walkin  Clinical Lists Changes R shoulder hurts.. states she has DJD. takes muscle relaxers & pain meds. out of both. using heat & ice trying to help. pcp is on vacation. covering md is not here . Dr. Jeanice Lim will give her enough pain meds to get her thru to her appt with pcp.Golden Circle RN  March 19, 2010 4:59 PM  Medications: Rx of OXYCODONE-ACETAMINOPHEN 5-325 MG TABS (OXYCODONE-ACETAMINOPHEN) 1 by mouth two times a day as needed severe pain;  #20 x 0;  Signed;  Entered by: Milinda Antis MD;  Authorized by: Milinda Antis MD;  Method used: Handwritten    Prescriptions: OXYCODONE-ACETAMINOPHEN 5-325 MG TABS (OXYCODONE-ACETAMINOPHEN) 1 by mouth two times a day as needed severe pain  #20 x 0   Entered and Authorized by:   Milinda Antis MD   Signed by:   Milinda Antis MD on 03/19/2010   Method used:   Handwritten   RxID:   [id number]

## 2010-07-24 NOTE — Assessment & Plan Note (Signed)
Summary: FU,MC   Vital Signs:  Patient profile:   45 year old female Height:      63.5 inches Weight:      192 pounds BMI:     33.60 BSA:     1.91 Temp:     98.5 degrees F Pulse rate:   73 / minute BP sitting:   128 / 76  Vitals Entered By: Jone Baseman CMA (May 24, 2009 3:09 PM) CC: follow up back pain, diabetes, congestion, foot pain Is Patient Diabetic? Yes Did you bring your meter with you today? No Pain Assessment Patient in pain? yes     Location: back Intensity: 5   Primary Care Provider:  Ancil Boozer  MD  CC:  follow up back pain, diabetes, congestion, and foot pain.  History of Present Illness: back pain: overall has been stable with occasional flares as in past.  taking percocet as needed - this previous prescription has lasted now for about 3 months.  no pain down legs, just occasional pain in low back and paraspinous muscles.  also some in trigger points of shoulders, R>L.    diabetes: quit taking metformin as prescribed just because she felt like it.  is aware that she needs to be back on it.  knows reasons to be - father who was diabetic had legs amputated, was ESRD.   congestion:  chronic but worsened acutely this past weekend.  associated with it is cough, sore throat, L ear feeling stopped up, sneezing, headache and sinus pressure.  no fevers.  has been taking fexofenadine but nothing else.  is having trouble sleeping at night 2/2 congestion.   foot pain: slightly improved but continues to bother her on medial edge of great toe base when putting on shoes.  no warmth or redness to area.   Habits & Providers  Alcohol-Tobacco-Diet     Tobacco Status: current     Tobacco Counseling: to quit use of tobacco products     Cigarette Packs/Day: 0.25  Current Medications (verified): 1)  Topamax 25 Mg Tabs (Topiramate) .Marland Kitchen.. 1 Tablet By Mouth Three Times A Day 2)  Methocarbamol 750 Mg Tabs (Methocarbamol) .Marland Kitchen.. 1 Tablet By Mouth Three Times A Day 3)   Nabumetone 500 Mg Tabs (Nabumetone) .Marland Kitchen.. 1 Tablet 2-3 Times Daily With Food 4)  Fexofenadine Hcl 180 Mg Tabs (Fexofenadine Hcl) .Marland Kitchen.. 1 Tablet By Mouth Once Daily 5)  Promethazine Hcl 25 Mg Tabs (Promethazine Hcl) .Marland Kitchen.. 1 Tablet By Mouth As Needed 6)  Alprazolam 0.5 Mg Tabs (Alprazolam) .Marland Kitchen.. 1 Tablet By Mouth As Needed 7)  Metformin Hcl 1000 Mg Tabs (Metformin Hcl) .Marland Kitchen.. 1 By Mouth Two Times A Day For Diabetes 8)  Nexium 40 Mg Cpdr (Esomeprazole Magnesium) .... Take 1 Tab Each Morning 9)  Prodigy Pocket Blood Glucose W/device Kit (Blood Glucose Monitoring Suppl) .... Disp #1 Glucometer Kit.  Please Also Dispense Lancets #100 For Kit and Glucose Test Strips For Testing Once Daily, 1 Mo Supply. 10)  Oxycodone-Acetaminophen 5-325 Mg Tabs (Oxycodone-Acetaminophen) .Marland Kitchen.. 1 By Mouth Two Times A Day As Needed Severe Pain 11)  Proair Hfa 108 (90 Base) Mcg/act Aers (Albuterol Sulfate) .... 2 Puffs Every 4 Hours As Needed For Wheezing/asthma Flare 12)  Promethazine Hcl 25 Mg Tabs (Promethazine Hcl) .... 1/2 - 1 By Mouth Q8hr As Needed Nausea. 13)  Maxalt-Mlt 10 Mg Tbdp (Rizatriptan Benzoate) .... As Directed For Migraine 14)  Amoxicillin-Pot Clavulanate 875-125 Mg Tabs (Amoxicillin-Pot Clavulanate) .Marland Kitchen.. 1 By Mouth Two Times A  Day For 10 Days. 15)  Tussionex Pennkinetic Er 8-10 Mg/44ml Lqcr (Chlorpheniramine-Hydrocodone) .... 5ml By Mouth Two Times A Day As Needed Cough. Disp 10 Day Supply. 16)  Fluticasone Propionate 50 Mcg/act Susp (Fluticasone Propionate) .... 2 Sprays Each Nostril Once Daily.  Lean Forward When Using Medicine.  Allergies (verified): No Known Drug Allergies  Past History:  Past medical, surgical, family and social histories (including risk factors) reviewed for relevance to current acute and chronic problems.  Past Medical History: Reviewed history from 09/26/2008 and no changes required. 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 Tubal ligation-1995 Axillary sweat glands and lymph nodes removed bilaterally 2001  Family History: Reviewed history from 05/09/2009 and no changes required. Rectal Cancer - mother Family History Breast cancer 1st degree relative <50- sister age 48 Family History of CAD Female 1st degree relative <50- father MI x5  Family History Diabetes 1st degree relative- parents and sister family history of hypertenion family history of gout (Grandfather)  Social History: Reviewed history from 09/26/2008 and no changes required. Pt lives with her daughter and son ages 99 and 35, and grandson age 75.  She is currently unemployed.  Enjoys reading.  [place]iginally from [place].  moved here to [place] in 2007.  disabled 2/2 depression/anxiety  Review of Systems       per HPI  Physical Exam  General:  no acute distress, alert. pleasant Head:  NCAT Eyes:  sclera clear Ears:  bilateral congestion with some bulging but not dull no pus Nose:  mild bogginess of turbinates. mild clear rhinorrhea. Mouth:  poor dentition with significant dental work in the past.  MMM Neck:  supple.  no LAD Lungs:  Normal respiratory effort, chest expands symmetrically. Lungs are clear to auscultation, no crackles or wheezes. Heart:  Normal rate and regular rhythm. S1 and S2 normal without gallop, murmur, click, rub or other extra sounds. Msk:  L foot:  Tender to palpation over first tarsal bone. No tenderness at MTP or PIP on great toe. Pain with passive and active extension for first toe.  mild pain in lumbar paraspinous muscles and over R shoulder trigger point.  FROM in lumbar spine.  Diabetes Management Exam:    Foot Exam (with socks and/or shoes not present):       Sensory-Pinprick/Light touch:          Left medial foot (L-4): normal          Left dorsal foot (L-5): normal          Left lateral foot (S-1): normal          Right medial foot (L-4): normal          Right dorsal foot (L-5): normal          Right lateral foot (S-1): normal       Sensory-Monofilament:          Left foot: normal          Right foot: normal       Inspection:          Left foot: normal  Right foot: normal       Nails:          Left foot: normal          Right foot: normal   Impression & Recommendations:  Problem # 1:  LOW BACK PAIN, CHRONIC (ICD-724.2) Assessment Unchanged  stable.  not abusing medication.  refilled her as needed percocet.  no red flags on examination or history  Her updated medication list for this problem includes:    Methocarbamol 750 Mg Tabs (Methocarbamol) .Marland Kitchen... 1 tablet by mouth three times a day    Nabumetone 500 Mg Tabs (Nabumetone) .Marland Kitchen... 1 tablet 2-3 times daily with food    Oxycodone-acetaminophen 5-325 Mg Tabs (Oxycodone-acetaminophen) .Marland Kitchen... 1 by mouth two times a day as needed severe pain  Orders: [facility]- Est  Level 4 ([address]109)  Problem # 2:  URI (ICD-465.9) Assessment: Deteriorated strongly encouraged use of nasal spray for bogginess of tubinates and sinus pressure.  no indication for abx at this point.   Her updated medication list for this problem includes:    Nabumetone 500 Mg Tabs (Nabumetone) .Marland Kitchen... 1 tablet 2-3 times daily with food    Fexofenadine Hcl 180 Mg Tabs (Fexofenadine hcl) .Marland Kitchen... 1 tablet by mouth once daily    Promethazine Hcl 25 Mg Tabs (Promethazine hcl) .Marland Kitchen... 1 tablet by mouth as needed    Promethazine Hcl 25 Mg Tabs (Promethazine hcl) .Marland Kitchen... 1/2 - 1 by mouth q8hr as needed nausea.    Tussionex Pennkinetic Er 8-10 Mg/67ml Lqcr (Chlorpheniramine-hydrocodone) .Marland KitchenMarland KitchenMarland KitchenMarland Kitchen 5ml by mouth two times a day as needed  cough. disp 10 day supply.  Orders: [facility]- Est  Level 4 (99214)  Problem # 3:  CALLUS, LEFT FOOT (ICD-700) Assessment: Improved advised callous pad to toe area, monitoring for skin breakdown.   Orders: [facility]- Est  Level 4 (60454)  Problem # 4:  DIABETES MELLITUS, TYPE II (ICD-250.00) Assessment: Deteriorated encouraged restarting metformin as she had been doing well with her DM.  monitor.   Her updated medication list for this problem includes:    Metformin Hcl 1000 Mg Tabs (Metformin hcl) .Marland Kitchen... 1 by mouth two times a day for diabetes  Orders: [facility]- Est  Level 4 (09811)  Complete Medication List: 1)  Topamax 25 Mg Tabs (Topiramate) .Marland Kitchen.. 1 tablet by mouth three times a day 2)  Methocarbamol 750 Mg Tabs (Methocarbamol) .Marland Kitchen.. 1 tablet by mouth three times a day 3)  Nabumetone 500 Mg Tabs (Nabumetone) .Marland Kitchen.. 1 tablet 2-3 times daily with food 4)  Fexofenadine Hcl 180 Mg Tabs (Fexofenadine hcl) .Marland Kitchen.. 1 tablet by mouth once daily 5)  Promethazine Hcl 25 Mg Tabs (Promethazine hcl) .Marland Kitchen.. 1 tablet by mouth as needed 6)  Alprazolam 0.5 Mg Tabs (Alprazolam) .Marland Kitchen.. 1 tablet by mouth as needed 7)  Metformin Hcl 1000 Mg Tabs (Metformin hcl) .Marland Kitchen.. 1 by mouth two times a day for diabetes 8)  Nexium 40 Mg Cpdr (Esomeprazole magnesium) .... Take 1 tab each morning 9)  Prodigy Pocket Blood Glucose W/device Kit (Blood glucose monitoring suppl) .... Disp #1 glucometer kit.  please also dispense lancets #100 for kit and glucose test strips for testing once daily, 1 mo supply. 10)  Oxycodone-acetaminophen 5-325 Mg Tabs (Oxycodone-acetaminophen) .Marland Kitchen.. 1 by mouth two times a day as needed severe pain 11)  Proair Hfa 108 (90 Base) Mcg/act Aers (Albuterol sulfate) .... 2 puffs every 4 hours as needed for wheezing/asthma flare 12)  Promethazine Hcl 25 Mg Tabs (Promethazine hcl) .... 1/2 - 1 by  mouth q8hr as needed nausea. 13)  Maxalt-mlt 10 Mg Tbdp (Rizatriptan benzoate) .... As directed for migraine 14)   Amoxicillin-pot Clavulanate 875-125 Mg Tabs (Amoxicillin-pot clavulanate) .Marland Kitchen.. 1 by mouth two times a day for 10 days. 15)  Tussionex Pennkinetic Er 8-10 Mg/63ml Lqcr (Chlorpheniramine-hydrocodone) .... 5ml by mouth two times a day as needed cough. disp 10 day supply. 16)  Fluticasone Propionate 50 Mcg/act Susp (Fluticasone propionate) .... 2 sprays each nostril once daily.  lean forward when using medicine.  Patient Instructions: 1)  Restart your metformin for your diabetes. 2)  For your foot - try a corn/callous pad to the area that is sore to protect it.  3)  For your congestion - start the nose spray I [facility] to [facility] for you. 4)  Please follow up in February for your next diabetes check up or sooner if needed. Prescriptions: FLUTICASONE PROPIONATE 50 MCG/ACT SUSP (FLUTICASONE PROPIONATE) 2 sprays each nostril once daily.  lean forward when using medicine.  #1 x 6   Entered and Authorized by:   Ancil Boozer  MD   Signed by:   Ancil Boozer  MD on 05/24/2009   Method used:   Electronically to        [facility]  [address] [phone]* (retail)       [id number]       [place], [place]  [address]       Ph: [phone] or [phone]       Fax: [phone]   RxID:   [id number] OXYCODONE-ACETAMINOPHEN 5-325 MG TABS (OXYCODONE-ACETAMINOPHEN) 1 by mouth two times a day as needed severe pain  #60 x 0   Entered and Authorized by:   Ancil Boozer  MD   Signed by:   Ancil Boozer  MD on 05/24/2009   Method used:   Print then Give to Patient   RxID:   [id number]   Prevention & Chronic Care Immunizations   Influenza vaccine: Not documented    Tetanus booster: Not documented    Pneumococcal vaccine: Not documented  Other Screening   Pap smear: Not documented    Mammogram: Not documented   Smoking status: current  (05/24/2009)  Diabetes Mellitus   HgbA1C: 6.2  (05/09/2009)   Hemoglobin A1C due: 12/22/2008    Eye exam: No diabetic retinopathy.      (02/03/2009)   Eye exam due: 02/03/2010    Foot exam: yes  (05/24/2009)   High risk foot: Not documented   Foot care education: completed  (10/24/2008)   Foot exam due: 10/24/2009    Urine microalbumin/creatinine ratio: Not documented   Urine microalbumin action/deferral: Not indicated    Diabetes flowsheet reviewed?: Yes   Progress toward A1C goal: At goal  Lipids   Total Cholesterol: 135  (09/26/2008)   LDL: 62  (09/26/2008)   LDL Direct: Not documented   HDL: 41  (09/26/2008)   Triglycerides: 160  (09/26/2008)  Self-Management Support :   Personal Goals (by the next clinic visit) :     Personal A1C goal: 8  (05/24/2009)     Personal blood pressure goal: 130/80  (05/24/2009)     Personal LDL goal: 100  (05/24/2009)    Diabetes self-management support: Not documented    Diabetes self-management support not done because: Good outcomes  (05/24/2009)   Last diabetes self-management training by diabetes educator: completed

## 2010-07-24 NOTE — Assessment & Plan Note (Signed)
Summary: f/u visit/bmc   Vital Signs:  Patient profile:   45 year old female Height:      63.5 inches Weight:      194 pounds BMI:     33.95 BSA:     1.92 Temp:     98.3 degrees F Pulse rate:   67 / minute BP sitting:   108 / 63  Vitals Entered By: Jone Baseman CMA (September 22, 2009 11:50 AM) CC: ?thyroid issues, f/u neck, allergy meds, blurry vision, depression Is Patient Diabetic? Yes Did you bring your meter with you today? No Pain Assessment Patient in pain? no        Primary Care Provider:  Ancil Boozer  MD  CC:  ?thyroid issues, f/u neck, allergy meds, blurry vision, and depression.  History of Present Illness: thyroid: has noticed increase in swelling on R side.  also some difficulty swallowing at times and discomfort.  denies hoarseness.  has noticed severe cold intolerance suddenly.  denies palpitations.  denies polyuria or polydipsia  neck/arm pain: improved with collar.  never too neurontin as prescribed.    allergy meds: states one sent last time isn't covered on her formulary and she can't afford it.  isn't using nose spray despite recommendations.   blurry vision: happens occasionally and then resolves on its own.  never checks sugars during these times  depression: still doesn't want to go to mental health because she states she can't afford it.  states she snapped 2 days ago but won't tell me what she means.  denies SI/HI.  doesn't want a med at this time  Habits & Providers  Alcohol-Tobacco-Diet     Tobacco Status: current     Tobacco Counseling: to quit use of tobacco products     Cigarette Packs/Day: 0.5  Current Medications (verified): 1)  Topamax 25 Mg Tabs (Topiramate) .Marland Kitchen.. 1 Tablet By Mouth Three Times A Day - States She Is Taking It Prn 2)  Methocarbamol 750 Mg Tabs (Methocarbamol) .Marland Kitchen.. 1 Tablet By Mouth Three Times A Day 3)  Nabumetone 500 Mg Tabs (Nabumetone) .Marland Kitchen.. 1 Tablet 2-3 Times Daily With Food 4)  Cetirizine-Pseudoephedrine 5-120 Mg  Xr12h-Tab (Cetirizine-Pseudoephedrine) .Marland Kitchen.. 1 By Mouth Once Daily For Allergies/congestion 5)  Metformin Hcl 1000 Mg Tabs (Metformin Hcl) .Marland Kitchen.. 1 By Mouth Two Times A Day For Diabetes 6)  Nexium 40 Mg Cpdr (Esomeprazole Magnesium) .... Take 1 Tab Each Morning 7)  Prodigy Pocket Blood Glucose W/device Kit (Blood Glucose Monitoring Suppl) .... Disp #1 Glucometer Kit.  Please Also Dispense Lancets #100 For Kit and Glucose Test Strips For Testing Once Daily, 1 Mo Supply. 8)  Oxycodone-Acetaminophen 5-325 Mg Tabs (Oxycodone-Acetaminophen) .Marland Kitchen.. 1 By Mouth Two Times A Day As Needed Severe Pain 9)  Proair Hfa 108 (90 Base) Mcg/act Aers (Albuterol Sulfate) .... 2 Puffs Every 4 Hours As Needed For Wheezing/asthma Flare 10)  Maxalt-Mlt 10 Mg Tbdp (Rizatriptan Benzoate) .... As Directed For Migraine 11)  Fluticasone Propionate 50 Mcg/act Susp (Fluticasone Propionate) .... 2 Sprays Each Nostril Once Daily.  Lean Forward When Using Medicine. 12)  Gabapentin 300 Mg Caps (Gabapentin) .Marland Kitchen.. 1 By Mouth At Bedtime For Shoulder Pain.  After 1 Week Increase To Two Times A Day  Allergies (verified): No Known Drug Allergies  Past History:  PMH reviewed for relevance  Social History: Packs/Day:  0.5  Review of Systems       per HPI  Physical Exam  General:  no acute distress, alert. pleasant.  very  difficult to focus.  VS reviewed Eyes:  PERRL.  sclera clear.   Neck:  notable assymmetry on R side of neck.  thyroid enlarged on this side.  no focal nodule apprecaited but tender to touch.  Lungs:  Normal respiratory effort, chest expands symmetrically. Lungs are clear to auscultation, no crackles or wheezes. Heart:  Normal rate and regular rhythm. S1 and S2 normal without gallop, murmur, click, rub or other extra sounds. Psych:  hard to focus.  poor eye contact.  often rolling her eyes at me   Impression & Recommendations:  Problem # 1:  THYROMEGALY (ICD-240.9) Assessment New check [place], TSH.  f/u after these  studies done on next plan of action.  Orders: [facility]SH-[facility] ([address]-60454) Ultrasound (Ultrasound) [facility]- Est  Level 4 (09811)  Problem # 2:  BLURRED VISION (ICD-368.8) Assessment: New  suspect likely due to fluctuating blood sugars.  monitor  Orders: [facility]- Est  Level 4 (91478)  Problem # 3:  SHOULDER PAIN, LEFT (ICD-719.41) Assessment: Improved  improving with collar use.  continue as needed.  still consider neurotin use - suspect possible pinched nerve  Her updated medication list for this problem includes:    Methocarbamol 750 Mg Tabs (Methocarbamol) .Marland Kitchen... 1 tablet by mouth three times a day    Nabumetone 500 Mg Tabs (Nabumetone) .Marland Kitchen... 1 tablet 2-3 times daily with food    Oxycodone-acetaminophen 5-325 Mg Tabs (Oxycodone-acetaminophen) .Marland Kitchen... 1 by mouth two times a day as needed severe pain  Orders: [facility]- Est  Level 4 (99214)  Problem # 4:  DEPRESSION (ICD-311) Assessment: Deteriorated  likely too complicated to manage here at [facility].  encouraged f/u with mental health.   Orders: [facility]- Est  Level 4 (29562)  Problem # 5:  ALLERGIC RHINITIS (ICD-477.9) Assessment: Deteriorated  encouraged medication use as prescribed.  showed her a copy of her formulary that states it covers cetirizine d  Her updated medication list for this problem includes:    Fluticasone Propionate 50 Mcg/act Susp (Fluticasone propionate) .Marland Kitchen... 2 sprays each nostril once daily.  lean forward when using medicine.  Orders: [facility]- Est  Level 4 (13086)  Complete Medication List: 1)  Topamax 25 Mg Tabs (Topiramate) .Marland Kitchen.. 1 tablet by mouth three times a day - states she is taking it prn 2)  Methocarbamol 750 Mg Tabs (Methocarbamol) .Marland Kitchen.. 1 tablet by mouth three times a day 3)  Nabumetone 500 Mg Tabs (Nabumetone) .Marland Kitchen.. 1 tablet 2-3 times daily with food 4)  Cetirizine-pseudoephedrine 5-120 Mg Xr12h-tab (Cetirizine-pseudoephedrine) .Marland Kitchen.. 1 by mouth once daily for allergies/congestion 5)  Metformin Hcl 1000 Mg Tabs  (Metformin hcl) .Marland Kitchen.. 1 by mouth two times a day for diabetes 6)  Nexium 40 Mg Cpdr (Esomeprazole magnesium) .... Take 1 tab each morning 7)  Prodigy Pocket Blood Glucose W/device Kit (Blood glucose monitoring suppl) .... Disp #1 glucometer kit.  please also dispense lancets #100 for kit and glucose test strips for testing once daily, 1 mo supply. 8)  Oxycodone-acetaminophen 5-325 Mg Tabs (Oxycodone-acetaminophen) .Marland Kitchen.. 1 by mouth two times a day as needed severe pain 9)  Proair Hfa 108 (90 Base) Mcg/act Aers (Albuterol sulfate) .... 2 puffs every 4 hours as needed for wheezing/asthma flare 10)  Maxalt-mlt 10 Mg Tbdp (Rizatriptan benzoate) .... As directed for migraine 11)  Fluticasone Propionate 50 Mcg/act Susp (Fluticasone propionate) .... 2 sprays each nostril once daily.  lean forward when using medicine. 12)  Gabapentin 300 Mg Caps (Gabapentin) .Marland Kitchen.. 1 by mouth at bedtime for shoulder pain.  after 1 week increase to two times a day  Patient Instructions: 1)  Be sure to get your ultrasound as ordered. 2)  I will call you if there is anything unusual with your lab work. 3)  cetirizine D (generic Zyrtec D) is covered by your formulary - ask the pharmacist again. 4)  Blurry vision is likely from sugar changes 5)  continue to use the neck collar as needed 6)  look into mental health if things continue to get worse.

## 2010-07-24 NOTE — Progress Notes (Signed)
Summary: triage  Phone Note Call from Patient Call back at Home Phone [phone]   Caller: Patient Summary of Call: has a knot on neck and needs to talk to nurse Initial call taken by: De Nurse,  December 26, 2009 10:16 AM  Follow-up for Phone Call        states she gets "these knots on my neck" it is on the r side this time. painful. finished all pain meds & is using ice . wants to be seen. gave her 3:30 appt with Dr. Hulen Luster as md had a cancellation at that time. Follow-up by: Golden Circle RN,  December 26, 2009 10:21 AM  Additional Follow-up for Phone Call Additional follow up Details #1::        Will follow up Dr. Jeri Lager note.  Additional Follow-up by: Bobby Rumpf  MD,  December 27, 2009 11:09 AM

## 2010-07-24 NOTE — Consult Note (Signed)
Summary: [facility]   Imported By: Clydell Hakim 02/03/2009 16:41:42  _____________________________________________________________________  External Attachment:    Type:   Image     Comment:   External Document  Appended Document: [facility]    Clinical Lists Changes  Observations: Added new observation of MICALBRECACT: Not indicated (02/03/2009 16:54) Added new observation of DMEYEEXAMNXT: 02/03/2010 (02/03/2009 16:54) Added new observation of DIAB EYE EX: No diabetic retinopathy.    (02/03/2009 16:54) Added new observation of HTN PROGRESS: N/A (02/03/2009 16:54) Added new observation of HTN FSREVIEW: N/A (02/03/2009 16:54) Added new observation of LIPID PROGRS: N/A (02/03/2009 16:54) Added new observation of LIPID FSREVW: N/A (02/03/2009 16:54)       Prevention & Chronic Care Immunizations   Influenza vaccine: Not documented    Tetanus booster: Not documented    Pneumococcal vaccine: Not documented  Other Screening   Pap smear: Not documented    Mammogram: Not documented   Smoking status: current  (10/24/2008)  Diabetes Mellitus   HgbA1C: 6.4  (12/28/2008)   Hemoglobin A1C due: 12/22/2008    Eye exam: No diabetic retinopathy.     (02/03/2009)   Eye exam due: 02/03/2010    Foot exam: yes  (10/24/2008)   High risk foot: Not documented   Foot care education: completed  (10/24/2008)   Foot exam due: 10/24/2009    Urine microalbumin/creatinine ratio: Not documented   Urine microalbumin action/deferral: Not indicated  Lipids   Total Cholesterol: 135  (09/26/2008)   LDL: 62  (09/26/2008)   LDL Direct: Not documented   HDL: 41  (09/26/2008)   Triglycerides: 160  (09/26/2008)  Self-Management Support :    Diabetes self-management support: Not documented   Last diabetes self-management training by diabetes educator: completed    Diabetic Eye Exam  Procedure date:  02/03/2009  Findings:      No  diabetic retinopathy.     Diabetic Eye Exam  Procedure date:  02/03/2009  Findings:      No diabetic retinopathy.

## 2010-07-24 NOTE — Assessment & Plan Note (Signed)
Summary: pain meds/[place]/carew   Vital Signs:  Patient profile:   45 year old female Height:      63.5 inches Weight:      208.4 pounds BMI:     36.47 Temp:     97.9 degrees F oral Pulse rate:   90 / minute BP sitting:   116 / 75  (left arm) Cuff size:   large  Vitals Entered By: Garen Grams LPN (March 30, 2010 9:48 AM) CC: pain in right shoulder Is Patient Diabetic? Yes Did you bring your meter with you today? No Pain Assessment Patient in pain? yes     Location: right shoulder Intensity: 6   Primary Care Dejana Pugsley:  Bobby Rumpf  MD  CC:  pain in right shoulder.  History of Present Illness: 1) Right shoulder pain: Seen on 01/18/10 for right shoulder pain x 2 weeks. Pain is located over trapezius and feels like "tight and sore" and "like a big knot". Worse with movement. Radiates up to right side of neck and down right lateral arm. Received trigger point injection which helped "for a while". Denies parasthesias or weakness. Neck films showed moderate to severe degenerative disc disease at C5-C6. Pain not relieved by oxycodone or by Flexeril (though she requests a refill on both of these) for her chronic back pain - this was switched to flexeril which did not relieve pain. Ice has not helped. Has not been doing prescribed exercises regularly.  Denies inciting injury, chest pain, dyspnea, parasthesias, weakness.  Med rec see below - takes metformin and lisinopril intermittently Allergies reviewed as as below ROS as above otherwise negative except for chronic back pain at baseline    Habits & Providers  Alcohol-Tobacco-Diet     Alcohol drinks/day: 0     Tobacco Status: current     Tobacco Counseling: to quit use of tobacco products     Cigarette Packs/Day: 0.25  Current Medications (verified): 1)  Topamax 25 Mg Tabs (Topiramate) .Marland Kitchen.. 1 Tablet By Mouth Three Times A Day For Headache Prevention 2)  Nabumetone 500 Mg Tabs (Nabumetone) .Marland Kitchen.. 1 Tablet 2-3 Times Daily With  Food 3)  Metformin Hcl 500 Mg Tabs (Metformin Hcl) .... 2 By Mouth Two Times A Day For Diabetes Control. 4)  Nexium 40 Mg Cpdr (Esomeprazole Magnesium) .... Take 1 Tab Each Morning 5)  Prodigy Pocket Blood Glucose W/device Kit (Blood Glucose Monitoring Suppl) .... Disp #1 Glucometer Kit.  Please Also Dispense Lancets #100 For Kit and Glucose Test Strips For Testing Once Daily, 1 Mo Supply. 6)  Oxycodone-Acetaminophen 5-325 Mg Tabs (Oxycodone-Acetaminophen) .Marland Kitchen.. 1 By Mouth Two Times A Day As Needed Severe Pain 7)  Proair Hfa 108 (90 Base) Mcg/act Aers (Albuterol Sulfate) .... 2 Puffs Every 4 Hours As Needed For Wheezing/asthma Flare 8)  Maxalt-Mlt 10 Mg Tbdp (Rizatriptan Benzoate) .... As Directed For Migraine 9)  Fluticasone Propionate 50 Mcg/act Susp (Fluticasone Propionate) .... 2 Sprays Each Nostril Once Daily.  Lean Forward When Using Medicine. 10)  Gabapentin 300 Mg Caps (Gabapentin) .Marland Kitchen.. 1 By Mouth Two Times A Day For Pain 11)  Promethazine Hcl 25 Mg Tabs (Promethazine Hcl) .... 1/2 - 1 By Mouth Three Times A Day As Needed Nausea 12)  Lisinopril 5 Mg Tabs (Lisinopril) .Marland Kitchen.. 1 Tablet Daily For Kidney Protection 13)  Alprazolam 0.5 Mg Tabs (Alprazolam) .Marland Kitchen.. 1 By Mouth Three Times A Day As Needed Severe Anxiety.  May Refill Every 30 Days 14)  Cyclobenzaprine Hcl 10 Mg Tabs (Cyclobenzaprine Hcl) .Marland KitchenMarland KitchenMarland Kitchen  One Tab By Mouth Three Times A Day (Take Only At Night If Causes Drowsiness)  Allergies (verified): No Known Drug Allergies  Physical Exam  General:  overweight, uncomfortable appearing, holds right arm at side with elbow in 90 degree flexion, somewhat hostile as per usual  Msk:  right trapezius in spasm with multiple trigger points. full ROM at right shoulderwith pain at trapezius but with negative hawkins, neer's, empty can tests. 5/5 strength bilateral upper extremities in proximal and distal muscle groups  Neurologic:  sensation intact bilateral upper and lower extremities    Impression &  Recommendations:  Problem # 1:  SHOULDER PAIN, RIGHT (ICD-719.41)  Her updated medication list for this problem includes:    Nabumetone 500 Mg Tabs (Nabumetone) .Marland Kitchen... 1 tablet 2-3 times daily with food    Oxycodone-acetaminophen 5-325 Mg Tabs (Oxycodone-acetaminophen) .Marland Kitchen... 1 by mouth two times a day as needed severe pain    Cyclobenzaprine Hcl 10 Mg Tabs (Cyclobenzaprine hcl) ..... One tab by mouth three times a day (take only at night if causes drowsiness)  Likely secondary to spasm in trapezius - possibly precipitated by mild nerve root impingement with degenerative disc disease on review of films (though no neurological signs). Will refer to Sports Medicine for further evaluation and treatment. Suspect component of depression contributing to pain as well. Will continue flexeril. Reviewed strengthening / stretching exercises.    The following medications were removed from the medication list:    Skelaxin 800 Mg Tabs (Metaxalone) .Marland Kitchen... Take one by mouth q6 hours as needed muscle spasm Her updated medication list for this problem includes:    Nabumetone 500 Mg Tabs (Nabumetone) .Marland Kitchen... 1 tablet 2-3 times daily with food    Oxycodone-acetaminophen 5-325 Mg Tabs (Oxycodone-acetaminophen) .Marland Kitchen... 1 by mouth two times a day as needed severe pain    Cyclobenzaprine Hcl 10 Mg Tabs (Cyclobenzaprine hcl) ..... One tab by mouth three times a day (take only at night if causes drowsiness)  Orders: [facility]- Est Level  3 ([address])  Complete Medication List: 1)  Topamax 25 Mg Tabs (Topiramate) .Marland Kitchen.. 1 tablet by mouth three times a day for headache prevention 2)  Nabumetone 500 Mg Tabs (Nabumetone) .Marland Kitchen.. 1 tablet 2-3 times daily with food 3)  Metformin Hcl 500 Mg Tabs (Metformin hcl) .... 2 by mouth two times a day for diabetes control. 4)  Nexium 40 Mg Cpdr (Esomeprazole magnesium) .... Take 1 tab each morning 5)  Prodigy Pocket Blood Glucose W/device Kit (Blood glucose monitoring suppl) .... Disp #1 glucometer  kit.  please also dispense lancets #100 for kit and glucose test strips for testing once daily, 1 mo supply. 6)  Oxycodone-acetaminophen 5-325 Mg Tabs (Oxycodone-acetaminophen) .Marland Kitchen.. 1 by mouth two times a day as needed severe pain 7)  Proair Hfa 108 (90 Base) Mcg/act Aers (Albuterol sulfate) .... 2 puffs every 4 hours as needed for wheezing/asthma flare 8)  Maxalt-mlt 10 Mg Tbdp (Rizatriptan benzoate) .... As directed for migraine 9)  Fluticasone Propionate 50 Mcg/act Susp (Fluticasone propionate) .... 2 sprays each nostril once daily.  lean forward when using medicine. 10)  Gabapentin 300 Mg Caps (Gabapentin) .Marland Kitchen.. 1 by mouth two times a day for pain 11)  Promethazine Hcl 25 Mg Tabs (Promethazine hcl) .... 1/2 - 1 by mouth three times a day as needed nausea 12)  Lisinopril 5 Mg Tabs (Lisinopril) .Marland Kitchen.. 1 tablet daily for kidney protection 13)  Alprazolam 0.5 Mg Tabs (Alprazolam) .Marland Kitchen.. 1 by mouth three times a day as needed  severe anxiety.  may refill every 30 days 14)  Cyclobenzaprine Hcl 10 Mg Tabs (Cyclobenzaprine hcl) .... One tab by mouth three times a day (take only at night if causes drowsiness)  Patient Instructions: 1)  Make an appointment to be seen by our [facility] Medicine [facility] in 2 weeks  2)  Take the pain medication as prescribed.  3)  Follow up in one month  Prescriptions: LISINOPRIL 5 MG TABS (LISINOPRIL) 1 tablet daily for kidney protection  #30 x 1   Entered and Authorized by:   Bobby Rumpf  MD   Signed by:   Bobby Rumpf  MD on 03/30/2010   Method used:   Electronically to        [facility] [phone]* (retail)       [address]       [place], [place]  [address]       Ph: [phone] or [phone]       Fax: [phone]   RxID:   [id number] MAXALT-MLT 10 MG TBDP (RIZATRIPTAN BENZOATE) as directed for migraine  #9 x 1   Entered and Authorized by:   Bobby Rumpf  MD   Signed by:   Bobby Rumpf  MD on 03/30/2010   Method used:    Electronically to        [facility] [phone]* (retail)       [address]       [place], [place]  [address]       Ph: [phone] or [phone]       Fax: [phone]   RxID:   [id number] CYCLOBENZAPRINE HCL 10 MG TABS (CYCLOBENZAPRINE HCL) one tab by mouth three times a day (take only at night if causes drowsiness)  #60 x 0   Entered and Authorized by:   Bobby Rumpf  MD   Signed by:   Bobby Rumpf  MD on 03/30/2010   Method used:   Print then Give to Patient   RxID:   [id number] OXYCODONE-ACETAMINOPHEN 5-325 MG TABS (OXYCODONE-ACETAMINOPHEN) 1 by mouth two times a day as needed severe pain  #45 x 0   Entered and Authorized by:   Bobby Rumpf  MD   Signed by:   Bobby Rumpf  MD on 03/30/2010   Method used:   Print then Give to Patient   RxID:   [id number]

## 2010-07-24 NOTE — Miscellaneous (Signed)
Summary: Tobacco User  Clinical Lists Changes  Problems: Added new problem of TOBACCO USER (ICD-305.1) 

## 2010-07-24 NOTE — Progress Notes (Signed)
Summary: triage  Phone Note Call from Patient Call back at Home Phone [phone]   Caller: Patient Summary of Call: Pt has a broke tooth and hurting and would like to be referred to the [facility]. Initial call taken by: Clydell Hakim,  Nov 08, 2009 4:02 PM  Follow-up for Phone Call        told her she will need to be seen. wait is long for some things. if on antibiotics & pain meds that bill bring her to top of list & appt may be within 2 wks. she has pain meds for her other issues. appt made for 1:30 tomorrow (best time for her). told her to try heat or cold to outside of face to see what works best. may use tylenol if out of other meds.  she is interested in a partial as she is missing teeth. the on that herts is on bottom of R side. she is missing the incisor. states it is the one behind where that used to be. we disussed local places that do them. pricing is 25-280 for partials. 250 each for full plate. she had already called around & got prices. Follow-up by: Golden Circle RN,  Nov 08, 2009 4:08 PM

## 2010-07-24 NOTE — Assessment & Plan Note (Signed)
Summary: low blood sugar/AC   Vital Signs:  Patient profile:   45 year old female Height:      63.5 inches Weight:      207.3 pounds Temp:     98.4 degrees F Pulse rate:   91 / minute BP sitting:   131 / 80  (right arm)  Vitals Entered By: Jacki Cones RN (Oct 24, 2008 8:55 AM) Diabetic Foot Exam Date:  10/24/2008 Diabetes Foot Check Result:  normal Diabetes Foot Check Next Due:  1 yr Foot Care Education Date:  10/24/2008 Foot Care Education:  completed Foot Care Education Due:  1 yr Self-Mgt EDU Date:  10/24/2008 Self-Mgt EDU:  completed Self-Mgt EDU Next Due:  1 yr  CC: low blood sugars, R ear pain Is Patient Diabetic? Yes  Pain Assessment Patient in pain? yes     Location: right ear and tooth Intensity: 7 Type: aching   History of Present Illness: low blood sugars: for past several days has been having some low blood sugars. she was taking her glyburide at bedtime instead of in the am as recommended.  she would then wake up with confusion, nausea, sweatiness, a heat wave feeling, burning sensation in her genitals and weakness.  she would then eat some cereal.  the lowest she has seen was when she ate a bowl of cereal and her sugar got to 76 after this.  in checking her sugars her highest has been in the low 200s. she hasn't had these episodes since calling and chagning her glyburide to half a tablet QAM instead.  in terms of her diabetes she also did visit the diabetes education center.  she also would like to know how to check her feet.  as she is having some peeling on her feet.  R ear pain: continues.  using some tea tree oil.  did note a few days ago she had a cold chill but denies fevers.  ear isn't getting any better.    Habits & Providers     Tobacco Status: current     Tobacco Counseling: to quit use of tobacco products     Cigarette Packs/Day: 0.25  Current Medications (verified): 1)  Glyburide 5 Mg Tabs (Glyburide) .... 1/2 Tablet By Mouth Once Daily For  Diabetes 2)  Topamax 25 Mg Tabs (Topiramate) .Marland Kitchen.. 1 Tablet By Mouth Three Times A Day 3)  Methocarbamol 750 Mg Tabs (Methocarbamol) .Marland Kitchen.. 1 Tablet By Mouth Three Times A Day 4)  Nabumetone 500 Mg Tabs (Nabumetone) .Marland Kitchen.. 1 Tablet 2-3 Times Daily With Food 5)  Fexofenadine Hcl 180 Mg Tabs (Fexofenadine Hcl) .Marland Kitchen.. 1 Tablet By Mouth Once Daily 6)  Promethazine Hcl 25 Mg Tabs (Promethazine Hcl) .Marland Kitchen.. 1 Tablet By Mouth As Needed 7)  Alprazolam 0.5 Mg Tabs (Alprazolam) .Marland Kitchen.. 1 Tablet By Mouth As Needed 8)  Metformin Hcl 500 Mg Tabs (Metformin Hcl) .... Titrate Up As Directed To Goal 2 At [facility] and 2 At Midnight For Diabetes 9)  Nexium 40 Mg Cpdr (Esomeprazole Magnesium) .... Take 1 Tab Each Morning 10)  Nystatin-Triamcinolone 100000-0.1 Unit/gm-% Crea (Nystatin-Triamcinolone) .... Apply To Itchy Areas Two Times A Day Until Improved. Dispense One Tube. 11)  Prodigy Pocket Blood Glucose W/device Kit (Blood Glucose Monitoring Suppl) .... Disp #1 Glucometer Kit.  Please Also Dispense Lancets #100 For Kit and Glucose Test Strips For Testing Once Daily, 1 Mo Supply. 12)  Amoxicillin 875 Mg Tabs (Amoxicillin) .Marland Kitchen.. 1 By Mouth Two Times A Day For 10 Days  Allergies (verified): No Known Drug Allergies  Past History:  Past medical, surgical, family and social histories (including risk factors) reviewed, and no changes noted (except as noted below).  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)    stomach ulcer    abnormal thyroid test    MRI lumbar spine 01/25/08 -- no spinal stenosis or nerve root encroachment, bilateral facet hypertrophy @ l4-5 and s1, low t1 marrow signal nonspecific and may represent a normal variant or manifestation of smoking or med use  Past Surgical History:    Reviewed history from 09/22/2008 and no changes required:     Tubal ligation-1995    Axillary sweat glands and lymph nodes removed bilaterally 2001  Family History:    Reviewed history from 09/26/2008 and no changes required:       Rectal Cancer - mother       Family History Breast cancer 1st degree relative <50- sister age 88       Family History of CAD Female 1st degree relative <50- father MI x5        Family History Diabetes 1st degree relative- parents and sister       family history of hypertenion  Social History:    Reviewed history from 09/26/2008 and no changes required:       Pt lives with her daughter and son ages 1 and 24, and grandson age 31.  She is currently unemployed.  Enjoys reading.  [place] from [place].  moved here to [place] in 2007.  disabled 2/2 depression/anxiety    Smoking Status:  current    Packs/Day:  0.25  Review of Systems       per HPI  Physical Exam  General:  obese, cooperative, NAD Ears:  R TM with layer of purulence behind TM.   Psych:  fatigued appearing,  flat affect.  appears irritated.  pleasant however.    Diabetes Management Exam:    Foot Exam (with socks and/or shoes not present):       Sensory-Pinprick/Light touch:          Left medial foot (L-4): normal          Left dorsal foot (L-5): normal          Left lateral foot (S-1): normal          Right medial foot (L-4): normal          Right dorsal foot (L-5): normal          Right lateral foot (S-1): normal       Sensory-Monofilament:          Left foot: normal          Right foot: normal       Inspection:          Left foot: normal          Right foot: normal       Nails:          Left foot: normal          Right foot: normal   Impression & Recommendations:  Problem # 1:  DIABETES MELLITUS, TYPE II (ICD-250.00) Assessment Improved  reeducated on medication use.  patient in agreement.  Her updated medication list for this problem includes:  Glyburide 5 Mg Tabs (Glyburide) .Marland Kitchen... 1/2 tablet by mouth once daily for diabetes     Metformin Hcl 500 Mg Tabs (Metformin hcl) .Marland Kitchen... 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   Her updated medication list for this problem includes:    Nabumetone 500 Mg Tabs (Nabumetone) .Marland Kitchen... 1 tablet 2-3 times daily with food    Amoxicillin 875 Mg Tabs (Amoxicillin) .Marland Kitchen... 1 by mouth two times a day for 10 days  Orders: [facility]- Est Level  3 ([address])  Complete Medication List: 1)  Glyburide 5 Mg Tabs (Glyburide) .... 1/2 tablet by mouth once daily for diabetes 2)  Topamax 25 Mg Tabs (Topiramate) .Marland Kitchen.. 1 tablet by mouth three times a day 3)  Methocarbamol 750 Mg Tabs (Methocarbamol) .Marland Kitchen.. 1 tablet by mouth three times a day 4)  Nabumetone 500 Mg Tabs (Nabumetone) .Marland Kitchen.. 1 tablet 2-3 times daily with food 5)  Fexofenadine Hcl 180 Mg Tabs (Fexofenadine hcl) .Marland Kitchen.. 1 tablet by mouth once daily 6)  Promethazine Hcl 25 Mg Tabs (Promethazine hcl) .Marland Kitchen.. 1 tablet by mouth as needed 7)  Alprazolam 0.5 Mg Tabs (Alprazolam) .Marland Kitchen.. 1 tablet by mouth as needed 8)  Metformin Hcl 500 Mg Tabs (Metformin hcl) .... Titrate up as directed to goal 2 at noon and 2 at midnight for diabetes 9)  Nexium 40 Mg Cpdr (Esomeprazole magnesium) .... Take 1 tab each morning 10)  Nystatin-triamcinolone 100000-0.1 Unit/gm-% Crea (Nystatin-triamcinolone) .... Apply to itchy areas two times a day until improved. dispense one tube. 11)  Prodigy Pocket Blood Glucose W/device Kit (Blood glucose monitoring suppl) .... Disp #1 glucometer kit.  please also dispense lancets #100 for kit and glucose test strips for testing once daily, 1 mo supply. 12)  Amoxicillin 875 Mg Tabs (Amoxicillin) .Marland Kitchen.. 1 by mouth two times a day for 10 days  Patient Instructions: 1)  Please follow up in 2 months for your diabetes 2)  remember to only take half of the glyburide and take it in the morning 3)  be sure to keep a  good eye on your feet for any infection 4)  try a cream called amlactin for your feet peeling 5)  be sure to [facility] your [facility] your [facility] infection at [facility] 6)  it was good to see you today, please feel free as always to call with any questions or concerns. Prescriptions: AMOXICILLIN 875 MG TABS (AMOXICILLIN) 1 by mouth two times a day for 10 days  #20 x 0   Entered and Authorized by:   Ancil Boozer  MD   Signed by:   Ancil Boozer  MD on 10/24/2008   Method used:   Electronically to        [facility] [address] [phone]* (retail)       [address]       [place], [place]  [address]       Ph: [phone] or [phone]       Fax: [phone]   RxID:   [id number]

## 2010-07-24 NOTE — Assessment & Plan Note (Signed)
Summary: F/U  KH   Vital Signs:  Patient profile:   45 year old female Height:      63.5 inches Weight:      203.5 pounds BMI:     35.61 Temp:     98.5 degrees F oral Pulse rate:   99 / minute BP sitting:   128 / 80  (left arm) Cuff size:   regular  Vitals Entered By: Garen Grams LPN (June 01, 2010 2:24 PM) CC: f/u dm, migraines Is Patient Diabetic? Yes Did you bring your meter with you today? No Pain Assessment Patient in pain? no        Primary Care Provider:  Bobby Rumpf  MD  CC:  f/u dm and migraines.  History of Present Illness: 1) Migraine: History of migraine headache. On Topamax for prevention. Maxalt and phenergan for symptom control. Reports one migraine this week. Occipital, pounding, last more than four hours (sometimes all day), occur with nausea w/o emesis and blurry vision. Worse with bright light, loud noises, better with medications as above. Now taking Topamax regularly as prescribed instead of sporadically with fewer migraines. Triggers are stress, smells and certain foods.   2) DM2: Does not check sugars - "meter is broken". Does not exercise. Has not been following dietary recommemdations. Last A1C = 6.2 about 6 months ago. Reports that she has seen an ophthalmologist in the past year with a good report.   Denies focal neurological signs, thunderclap, waking from sleep, head trauma. Denies polyuria, polydispia, vision change, neurological signs   Habits & Providers  Alcohol-Tobacco-Diet     Alcohol drinks/day: 0     Tobacco Status: current     Tobacco Counseling: to quit use of tobacco products     Cigarette Packs/Day: 0.25  Allergies: No Known Drug Allergies  Physical Exam  General:  overweight, angry appearing, poor eye contact, NAD   Diabetes Management Exam:    Foot Exam (with socks and/or shoes not present):       Sensory-Pinprick/Light touch:          Left medial foot (L-4): normal          Left dorsal foot (L-5): normal  Left lateral foot (S-1): normal          Right medial foot (L-4): normal          Right dorsal foot (L-5): normal          Right lateral foot (S-1): normal       Sensory-Monofilament:          Left foot: normal          Right foot: normal       Inspection:          Left foot: normal          Right foot: normal       Nails:          Left foot: normal          Right foot: normal    Eye Exam:       Eye Exam done here today          Results: normal (limited exam)    Impression & Recommendations:  Problem # 1:  MIGRAINE HEADACHE (ICD-346.90) Assessment Improved  Better control now that she is taking the Topamax as prescribed. Advised to continue to do so. Follow up in three months. Red flags that would prompt return to care were reviewed with patient and patient expressed  understanding.  Her updated medication list for this problem includes:    Nabumetone 500 Mg Tabs (Nabumetone) .Marland Kitchen... 1 tablet 2-3 times daily with food    Oxycodone-acetaminophen 5-325 Mg Tabs (Oxycodone-acetaminophen) .Marland Kitchen... 1 by mouth two times a day as needed severe pain    Maxalt-mlt 10 Mg Tbdp (Rizatriptan benzoate) .Marland Kitchen... As directed for migraine  Orders: [facility]- Est  Level 4 (16109)  Problem # 2:  DIABETES MELLITUS, TYPE II (ICD-250.00) Assessment: Unchanged  Deteriorated. Suspect poor medication adherence, lack of exercise to be primary cause. Reviewed diet and exercise recommendations. Follow up in three months.   Her updated medication list for this problem includes:    Metformin Hcl 500 Mg Tabs (Metformin hcl) .Marland Kitchen... 2 by mouth two times a day for diabetes control.    Lisinopril 5 Mg Tabs (Lisinopril) .Marland Kitchen... 1 tablet daily for kidney protection  Orders: A1C-[facility] (60454) [facility]- Est  Level 4 (09811)  Labs Reviewed: Creat: 0.93 (12/07/2009)     Last Eye Exam: normal (limited exam)  (06/01/2010) Reviewed HgBA1c results: 8.4 (06/01/2010)  6.3 (12/07/2009)  Complete Medication List: 1)  Topamax 25 Mg Tabs  (Topiramate) .Marland Kitchen.. 1 tablet by mouth three times a day for headache prevention 2)  Nabumetone 500 Mg Tabs (Nabumetone) .Marland Kitchen.. 1 tablet 2-3 times daily with food 3)  Metformin Hcl 500 Mg Tabs (Metformin hcl) .... 2 by mouth two times a day for diabetes control. 4)  Nexium 40 Mg Cpdr (Esomeprazole magnesium) .... Take 1 tab each morning 5)  Prodigy Pocket Blood Glucose W/device Kit (Blood glucose monitoring suppl) .... Disp #1 glucometer kit.  please also dispense lancets #100 for kit and glucose test strips for testing once daily, 1 mo supply. 6)  Oxycodone-acetaminophen 5-325 Mg Tabs (Oxycodone-acetaminophen) .Marland Kitchen.. 1 by mouth two times a day as needed severe pain 7)  Proair Hfa 108 (90 Base) Mcg/act Aers (Albuterol sulfate) .... 2 puffs every 4 hours as needed for wheezing/asthma flare 8)  Maxalt-mlt 10 Mg Tbdp (Rizatriptan benzoate) .... As directed for migraine 9)  Fluticasone Propionate 50 Mcg/act Susp (Fluticasone propionate) .... 2 sprays each nostril once daily.  lean forward when using medicine. 10)  Gabapentin 300 Mg Caps (Gabapentin) .Marland Kitchen.. 1 by mouth two times a day for pain 11)  Promethazine Hcl 25 Mg Tabs (Promethazine hcl) .... 1/2 - 1 by mouth three times a day as needed nausea 12)  Lisinopril 5 Mg Tabs (Lisinopril) .Marland Kitchen.. 1 tablet daily for kidney protection 13)  Alprazolam 0.5 Mg Tabs (Alprazolam) .Marland Kitchen.. 1 by mouth three times a day as needed severe anxiety.  may refill every 30 days 14)  Cyclobenzaprine Hcl 10 Mg Tabs (Cyclobenzaprine hcl) .... One tab by mouth three times a day (take only at night if causes drowsiness)  Patient Instructions: 1)  Follow up with me in three months. 2)  Try to get exercise and make sure you take your diabetes medicines Prescriptions: PROMETHAZINE HCL 25 MG TABS (PROMETHAZINE HCL) 1/2 - 1 by mouth three times a day as needed nausea  #30 x 3   Entered and Authorized by:   Bobby Rumpf  MD   Signed by:   Bobby Rumpf  MD on 06/01/2010   Method used:    Electronically to        [facility] [phone]* (retail)       [address]       [place], [place]  [address]  Ph: [phone] or [phone]       Fax: [phone]   RxID:   [id number] OXYCODONE-ACETAMINOPHEN 5-325 MG TABS (OXYCODONE-ACETAMINOPHEN) 1 by mouth two times a day as needed severe pain  #45 x 0   Entered and Authorized by:   Bobby Rumpf  MD   Signed by:   Bobby Rumpf  MD on 06/01/2010   Method used:   Print then Give to Patient   RxID:   [id number] PROAIR HFA 108 (90 BASE) MCG/ACT AERS (ALBUTEROL SULFATE) 2 puffs every 4 hours as needed for wheezing/asthma flare  #1 x 1   Entered and Authorized by:   Bobby Rumpf  MD   Signed by:   Bobby Rumpf  MD on 06/01/2010   Method used:   Print then Give to Patient   RxID:   [id number] ALPRAZOLAM 0.5 MG TABS (ALPRAZOLAM) 1 by mouth three times a day as needed severe anxiety.  may refill every 30 days  #90 x 1   Entered and Authorized by:   Bobby Rumpf  MD   Signed by:   Bobby Rumpf  MD on 06/01/2010   Method used:   Print then Give to Patient   RxID:   [id number] METFORMIN HCL 500 MG TABS (METFORMIN HCL) 2 by mouth two times a day for diabetes control.  #120 x 6   Entered and Authorized by:   Bobby Rumpf  MD   Signed by:   Bobby Rumpf  MD on 06/01/2010   Method used:   Electronically to        [facility] [phone]* (retail)       [address]       [place], [place]  [address]       Ph: [phone] or [phone]       Fax: [phone]   RxID:   [id number]    Orders Added: 1)  A1C-[facility] [83036] 2)  [facility]- Est  Level 4 [09381]    Laboratory Results   Blood Tests   Date/Time Received: June 01, 2010 2:49  PM  Date/Time Reported: June 01, 2010 3:11 PM   HGBA1C: 8.4%   (Normal Range: Non-Diabetic - 3-6%   Control Diabetic - 6-8%)  Comments: ...............test performed by......Marland KitchenBonnie A. Swaz[place], MLS  (ASCP)cm

## 2010-07-24 NOTE — Progress Notes (Signed)
Summary: Rx Req  Phone Note Call from Patient   Caller: Patient Summary of Call: pt would like to get an rx for phenagren.  Her previous dr used to give it to her.  [facility] [address]. Initial call taken by: Clydell Hakim,  January 06, 2009 1:54 PM  Follow-up for Phone Call        will send message to MD. Follow-up by: Theresia Lo RN,  January 06, 2009 2:38 PM  Additional Follow-up for Phone Call Additional follow up Details #1::        spoke with patient and she states she has a headache 5 days ago and it has finally abated but she has continued to have nausea for 3 days. she has med for migraine but nothing for nausea. would like Rx for phenergan. will send message to MD. Additional Follow-up by: Theresia Lo RN,  January 06, 2009 2:41 PM    Additional Follow-up for Phone Call Additional follow up Details #2::    okay.  [facility] [address] Follow-up by: Ancil Boozer  MD,  January 06, 2009 3:41 PM  New/Updated Medications: PROMETHAZINE HCL 25 MG TABS (PROMETHAZINE HCL) 1/2 - 1 by mouth q8hr as needed nausea. Prescriptions: PROMETHAZINE HCL 25 MG TABS (PROMETHAZINE HCL) 1/2 - 1 by mouth q8hr as needed nausea.  #30 x 1   Entered and Authorized by:   Ancil Boozer  MD   Signed by:   Ancil Boozer  MD on 01/06/2009   Method used:   [facility] [address] [phone]* (retail)       [address]       [place], [place]  [address]       Ph: [phone] or [phone]       Fax: [phone]   RxID:   [id number]  patient notified that rx has been sent in. she states she has been having pain in right side at waist for three days along with the nausea. she requests appointment Mondayand work in appointment is scheduled. advised to go to urgent care this weekend if she develops fever or pain worsens. Theresia Lo RN  January 06, 2009 3:57 PM

## 2010-07-24 NOTE — Assessment & Plan Note (Signed)
Summary: new pt/AC   Vital Signs:  Patient profile:   45 year old female Height:      63.5 inches Weight:      208.1 pounds BMI:     36.42 Temp:     97.8 degrees F oral Pulse rate:   87 / minute BP sitting:   99 / 65  (left arm) Cuff size:   large  Vitals Entered By: Garen Grams LPN (September 22, 1608 4:16 PM) CC: New Patient, f/u dm Pain Assessment Patient in pain? no        History of Present Illness: DM: new diagnosis.  has multiple family member with this diagnosis.  constantly distracted during our conversation however was excited to find out that we can treat her diabetes here and she doesn't need to go to an endocrinologist.  she does have an appt at the [facility] scheduled - asked her to keep this appt.  she would also like other nutrition advise - advised her to set up appt with dr Gerilyn Pilgrim.  she states she has been taking her glyburide and she has noted some funny taste in her mouth but nothing otherwise.  knows she is due for multple blood tests as a result of her diagnosis that we will schedule as a morning appt one day. knows that with just the one medicine we won't get adequate control but discussed that adding metformin may give [place] the control we desire.  discussed side effects and that is why we are starting our dosing slow.  discussed one nice side effect is possible weight loss.  she states she has been trying to exercise to help lose weight since getting her diagnosis.  she is very afraid of needing insulin and would like to realyl make lifestyle changes to help. discussed that at this time we dont' need to check sugars daily until we are getting close.  discussed symtpoms of low blood sugar.   thyroid: states she had a thryroid problem in the past treated by her doctor in [place].  she doesn't remember if it was over or underactive.  she states she was ona medicine for a year but she doesn't remember the name.  she would like to have her thyroid  checked.  Habits & Providers     Tobacco Status: current     Tobacco Counseling: to quit use of tobacco products     Cigarette Packs/Day: 0.5     Orthopedist: [facility]     Psychiatrist: [facility]     Psychologist: ?? can't remember name  Current Medications (verified): 1)  Glyburide 5 Mg Tabs (Glyburide) .Marland Kitchen.. 1 Tablet By Mouth Once Daily For Diabetes 2)  Doxycycline Monohydrate 100 Mg Tabs (Doxycycline Monohydrate) .Marland Kitchen.. 1 Tablet By Mouth Two Times A Day X 14 Days 3)  Keflex 500 Mg Caps (Cephalexin) .Marland Kitchen.. 1 Tablet By Mouth Four Times Daily X 7 Days 4)  Topamax 25 Mg Tabs (Topiramate) .Marland Kitchen.. 1 Tablet By Mouth Three Times A Day 5)  Methocarbamol 750 Mg Tabs (Methocarbamol) .Marland Kitchen.. 1 Tablet By Mouth Three Times A Day 6)  Nabumetone 500 Mg Tabs (Nabumetone) .Marland Kitchen.. 1 Tablet 2-3 Times Daily With Food 7)  Fexofenadine Hcl 180 Mg Tabs (Fexofenadine Hcl) .Marland Kitchen.. 1 Tablet By Mouth Once Daily 8)  Promethazine Hcl 25 Mg Tabs (Promethazine Hcl) .Marland Kitchen.. 1 Tablet By Mouth As Needed 9)  Alprazolam 0.5 Mg Tabs (Alprazolam) .Marland Kitchen.. 1 Tablet By Mouth As Needed 10)  Metformin Hcl 500 Mg Tabs (  Metformin Hcl) .... Titrate Up As Directed To Goal 2 At [facility] and 2 At Midnight For Diabetes  Allergies (verified): No Known Drug Allergies  Past History:  Past Medical History:    Depression    Diabetes mellitus, type II    Migraines    Arthritis in spine    stomach ulcer    abnormal thyroid test  Social History:    Pt lives with her daughter and son ages 58 and 64, and grandson age 35.  She is currently unemployed.  Enjoys reading.  [place]iginally from [place].  moved here to [place] in 2007    Packs/Day:  0.5  Review of Systems       per HPI. does not in the past she has had some glove distribution tingling of her hands. none currently.   Physical Exam  General:  Well-developed,well-nourished,in no acute distress; alert,appropriate and cooperative throughout examination obese Head:  Normocephalic and atraumatic  without obvious abnormalities. No apparent alopecia or balding. Lungs:  Normal respiratory effort, chest expands symmetrically. Lungs are clear to auscultation, no crackles or wheezes. Heart:  Normal rate and regular rhythm. S1 and S2 normal without gallop, murmur, click, rub or other extra sounds. Abdomen:  Bowel sounds positive,abdomen soft and non-tender without masses, organomegaly or hernias noted. Extremities:  No clubbing, cyanosis, edema, or deformity noted with normal full range of motion of all joints.   Psych:  rapid pressured speech.  not concentrating.  playing with phone entire visit.     Impression & Recommendations:  Problem # 1:  DIABETES MELLITUS, TYPE II (ICD-250.00) Assessment Deteriorated add metformin, get nutrition and diabetes counseling as scheduled.  get labs to complete therapy as likely will need acei, statin.  give meds adequate time to work, follow up after this has happened (in approx 3 mo)  Her updated medication list for this problem includes:    Glyburide 5 Mg Tabs (Glyburide) .Marland Kitchen... 1 tablet by mouth once daily for diabetes    Metformin Hcl 500 Mg Tabs (Metformin hcl) .Marland Kitchen... Titrate up as directed to goal 2 at noon and 2 at midnight for diabetes  Orders: A1C-[facility] (83036)Future Orders: Comp Met-[facility] ([id number]) ... 09/22/2009 Lipid-[facility] ([id number]) ... 09/22/2009  Problem # 2:  Hx of ABNORMAL THYROID FUNCTION TESTS (ICD-794.5) Assessment: New unclear if over or under active.  can be concurrent with diabetes and actually causing weight gain to be worse.  when checking labs check thyroid function tests as well.  Future Orders: TSH-[facility] ([id number]) ... 09/22/2009 Free T3-[facility] [id number]) ... 09/22/2009 Free T4-[facility] [id number]) ... 09/22/2009  Complete Medication List: 1)  Glyburide 5 Mg Tabs (Glyburide) .Marland Kitchen.. 1 tablet by mouth once daily for diabetes 2)  Doxycycline Monohydrate 100 Mg Tabs (Doxycycline monohydrate) .Marland Kitchen.. 1 tablet by mouth two times a  day x 14 days 3)  Keflex 500 Mg Caps (Cephalexin) .Marland Kitchen.. 1 tablet by mouth four times daily x 7 days 4)  Topamax 25 Mg Tabs (Topiramate) .Marland Kitchen.. 1 tablet by mouth three times a day 5)  Methocarbamol 750 Mg Tabs (Methocarbamol) .Marland Kitchen.. 1 tablet by mouth three times a day 6)  Nabumetone 500 Mg Tabs (Nabumetone) .Marland Kitchen.. 1 tablet 2-3 times daily with food 7)  Fexofenadine Hcl 180 Mg Tabs (Fexofenadine hcl) .Marland Kitchen.. 1 tablet by mouth once daily 8)  Promethazine Hcl 25 Mg Tabs (Promethazine hcl) .Marland Kitchen.. 1 tablet by mouth as needed 9)  Alprazolam 0.5 Mg Tabs (Alprazolam) .Marland Kitchen.. 1 tablet by mouth as needed 10)  Metformin Hcl 500 Mg Tabs (  Metformin hcl) .... Titrate up as directed to goal 2 at noon and 2 at midnight for diabetes  Patient Instructions: 1)  Please schedule a lab appointment so you can come in one morning when you haven't had anything to eat to check some lab work. 2)  Please schedule an appointment with me in 3 months. 3)  Please also schedule an appointment with our nutritionist Dr Gerilyn Pilgrim as soon as possible. 4)  Keep your appointment with the [facility] nutrition center on april 21st.  5)  Be sure to start taking the metformin 500mg  tablets.  Here is how to start it: 1 pill by mouth at midnight for 1 week then 1 at noon and 1 at midnight for 1 week then 1 at noon and 2 at midnight then 2 at noon and 2 at midnight.  After that stay at 2 pills at noon and 2 pills at midnight. 6)  I think with medicines, diet and exercise we will be able to get your diabetes under control.   7)  Please call with any questions or concerns Prescriptions: GLYBURIDE 5 MG TABS (GLYBURIDE) 1 tablet by mouth once daily for diabetes  #30 x 3   Entered and Authorized by:   Ancil Boozer  MD   Signed by:   Ancil Boozer  MD on 09/22/2008   Method used:   [facility] [address] [phone]* (retail)       [address]       [place], [place]  [address]       Ph: [phone] or  [phone]       Fax: [phone]   RxID:   [id number] METFORMIN HCL 500 MG TABS (METFORMIN HCL) titrate up as directed to goal 2 at noon and 2 at midnight for diabetes  #120 x 3   Entered and Authorized by:   Ancil Boozer  MD   Signed by:   Ancil Boozer  MD on 09/22/2008   Method used:   [facility]        [facility] [address] [phone]* (retail)       [address]       [place], [place]  [address]       Ph: [phone] or [phone]       Fax: [phone]   RxID:   [id number]   Laboratory Results   Blood Tests   Date/Time Received: September 22, 2008 4:28 PM  Date/Time Reported: September 22, 2008 4:39 PM   HGBA1C: 10.1%   (Normal Range: Non-Diabetic - 3-6%   Control Diabetic - 6-8%)  Comments: ...............test performed by......Marland KitchenBonnie A. [place], [place] (ASCP)

## 2010-07-24 NOTE — Assessment & Plan Note (Signed)
Summary: 250, 278 / JCS   Vital Signs:  Patient profile:   45 year old female Height:      63.5 inches Weight:      210.9 pounds BMI:     36.91  Vitals Entered By: Wyona Almas PHD (September 27, 2008 9:17 AM)  History of Present Illness: Assessment:  24-hr recall was not especially representative of normal, according to Ms. Currie.  Although she has been trying to eat more consistently since her dx of DM, her eating pattern is still erratic, i.e., yesterday's meals & snacks were at 9:30 AM, 3:45 PM, 8:30 PM, and 10:30 PM (fried chicken thigh).  She has stopped drinking Pepsi, and now drinks only water and diet drinks.  Usual daily intake also includes at least 1 ham & cheese sub.  Ms. Laureen Ochs has started walking at least a few minutes  ~5 X wk.  Ms. Laureen Ochs has had tingling/numbness in her fingertips for at least a year.  I advised that substitution of high-quality dietary fats for saturated and trans fats may be important in alleviating diabetic neuropathies.    Nutrition Diagnosis:  Physical inactivity (NB-2.1) as evidenced by self-report of <100 min ex/week.  Inappropriate intake of food fats (NI-51.3) related to take-out and processed foods as evidenced by consumption of fried chicken, chips, and sausage biscuit yesterday.  Inadequate fiber intake (NI-53.5) related to frequency of processed foods as evidenced by lack of veg's and fruits on 24-hr recall.    Monitoring/Eval:  [facility]; I encouraged her to take classes there, and to F/U with me as needed.  Also provided my phone number in case pt has Qs.     Allergies: No Known Drug Allergies   Complete Medication List: 1)  Glyburide 5 Mg Tabs (Glyburide) .Marland Kitchen.. 1 tablet by mouth once daily for diabetes 2)  Doxycycline Monohydrate 100 Mg Tabs (Doxycycline monohydrate) .Marland Kitchen.. 1 tablet by mouth two times a day x 14 days 3)  Keflex 500 Mg Caps (Cephalexin) .Marland Kitchen.. 1 tablet by mouth four times daily x 7 days 4)   Topamax 25 Mg Tabs (Topiramate) .Marland Kitchen.. 1 tablet by mouth three times a day 5)  Methocarbamol 750 Mg Tabs (Methocarbamol) .Marland Kitchen.. 1 tablet by mouth three times a day 6)  Nabumetone 500 Mg Tabs (Nabumetone) .Marland Kitchen.. 1 tablet 2-3 times daily with food 7)  Fexofenadine Hcl 180 Mg Tabs (Fexofenadine hcl) .Marland Kitchen.. 1 tablet by mouth once daily 8)  Promethazine Hcl 25 Mg Tabs (Promethazine hcl) .Marland Kitchen.. 1 tablet by mouth as needed 9)  Alprazolam 0.5 Mg Tabs (Alprazolam) .Marland Kitchen.. 1 tablet by mouth as needed 10)  Metformin Hcl 500 Mg Tabs (Metformin hcl) .... Titrate up as directed to goal 2 at noon and 2 at midnight for diabetes  Other Orders: Inital Assessment Each - [facility] [id number])  Patient Instructions: 1)  Eat at least 3 meals and 1-2 snacks per day.  2)  Use olive or canola oil exclusively for cooking or salads.   3)  Obtain twice as many veg's as protein or carbohydrate foods for both lunch and dinner.  No limit on vegetables!  (Aim for no more than 2 servings per meal of a starch food.) 4)  Limit sweets - both beverages and food.   5)  Walking goal:  30 min 5 X wk.  Look for exercise opportunities.   6)  Folllow up with [facility]:  [phone]).  7)  Remember,  TASTE PREFERENCES ARE LEARNED, which means these new food choices will get easier.

## 2010-07-24 NOTE — Miscellaneous (Signed)
Summary: new pt/AC    Habits & Providers     Alcohol drinks/day: 0     Tobacco Status: current     Cigarette Packs/Day: 0.25     Does Patient Exercise: yes     Type of exercise: walking     Exercise (avg: min/session): <30     Times/week: 3     STD Risk: no risk noted     Drug Use: past     Seat Belt Use: always     Sun Exposure: rarely  Comments: Pt states she drinks alcohol rarely when she goes home to [place] to visit.  Quit using cocaine in 1995.  Past History:  Past Medical History:    Depression    Diabetes mellitus, type II    Migraines    Arthritis in spine    stomach ulcer  Past Surgical History:    Tubal ligation-1995    Axillary sweat glands and lymph nodes removed bilaterally 2001  Family History:    Rectal Cancer - mother    Family History Breast cancer 1st degree relative <50- sister age 28    Family History of CAD Female 1st degree relative <50- father MI x5     Family History Diabetes 1st degree relative- parents and sister  Social History:    Pt lives with her daughter and son ages 74 and 22, and grandson age 38.  She is currently unemployed.  Enjoys reading.      Smoking Status:  current    Packs/Day:  0.25    Does Patient Exercise:  yes    Drug Use:  past    Seat Belt Use:  always    Sun Exposure-Excessive:  rarely    Ethnicity:  Black    Occupation:  None    EducationEngineer, petroleum    Residence:  Engineer, manufacturing:  Contractor    Alcohol:  Less than 3 drinks per week    Sex Orientation:  Heterosexual    HIV Risk:  no risk noted    Hepatitis Risk:  no risk noted    STD Risk:  no risk noted   Complete Medication List: 1)  Glyburide 5 Mg Tabs (Glyburide) .Marland Kitchen.. 1 tabet by mouth once daily 2)  Doxycycline Monohydrate 100 Mg Tabs (Doxycycline monohydrate) .Marland Kitchen.. 1 tablet by mouth two times a day x 14 days 3)  Keflex 500 Mg Caps (Cephalexin) .Marland Kitchen.. 1 tablet by mouth four times daily x 7 days 4)  Topamax 25 Mg Tabs (Topiramate) .Marland Kitchen..  1 tablet by mouth three times a day 5)  Methocarbamol 750 Mg Tabs (Methocarbamol) .Marland Kitchen.. 1 tablet by mouth three times a day 6)  Nabumetone 500 Mg Tabs (Nabumetone) .Marland Kitchen.. 1 tablet 2-3 times daily with food 7)  Fexofenadine Hcl 180 Mg Tabs (Fexofenadine hcl) .Marland Kitchen.. 1 tablet by mouth once daily 8)  Promethazine Hcl 25 Mg Tabs (Promethazine hcl) .Marland Kitchen.. 1 tablet by mouth as needed 9)  Alprazolam 0.5 Mg Tabs (Alprazolam) .Marland Kitchen.. 1 tablet by mouth as needed  Other Orders: No Charge Patient Arrived (NCPA0) (NCPA0)   Pt diagnosed with type II diabetes last week at urgent care.  She is very concerned about her blood sugars, states she is scared to eat.  Scheduled pt for appt today with Dr. Sandi Mealy.  Advised her if she is late

## 2010-07-24 NOTE — Assessment & Plan Note (Signed)
Summary: FU/KH   Vital Signs:  Patient profile:   45 year old female Height:      63.5 inches Weight:      194 pounds BMI:     33.95 BSA:     1.92 Temp:     98.3 degrees F Pulse rate:   78 / minute BP sitting:   125 / 84  Vitals Entered By: Jone Baseman CMA (Oct 26, 2009 8:39 AM) CC: f/u back pain, requesting podiatry referral Is Patient Diabetic? Yes Did you bring your meter with you today? No Pain Assessment Patient in pain? yes     Location: back Intensity: 5   Primary Care Provider:  Ancil Boozer  MD  CC:  f/u back pain and requesting podiatry referral.  History of Present Illness: back pain: initially a little better in back after manipulation but then developed some pain for about 3 days on right lower side of abdomen/back.  since resolved but now back pain again present.  better than when acutely seen however.  has only had to use percocet intermittently.  denies pain in legs now but does note that when she rests particualrly when lying on her back she will get numbness in her arms or legs or when sitting on toilet for prolonged period of time.  this is the only time she feels weak otherwise she doesn't.  she states she has normal BMs and urination without control issues.  still hasn't started gabapentin/neurontin as recommended now multiple times.   podiatry: given her diabetes she requests to get regular visits with a podiatrist because she is concerned about her feet.    Habits & Providers  Alcohol-Tobacco-Diet     Tobacco Status: current     Tobacco Counseling: to quit use of tobacco products     Cigarette Packs/Day: 0.25  Current Medications (verified): 1)  Topamax 25 Mg Tabs (Topiramate) .Marland Kitchen.. 1 Tablet By Mouth Three Times A Day - States She Is Taking It Prn 2)  Methocarbamol 750 Mg Tabs (Methocarbamol) .Marland Kitchen.. 1 Tablet By Mouth Three Times A Day 3)  Nabumetone 500 Mg Tabs (Nabumetone) .Marland Kitchen.. 1 Tablet 2-3 Times Daily With Food 4)  Cetirizine-Pseudoephedrine  5-120 Mg Xr12h-Tab (Cetirizine-Pseudoephedrine) .Marland Kitchen.. 1 By Mouth Once Daily For Allergies/congestion 5)  Metformin Hcl 1000 Mg Tabs (Metformin Hcl) .Marland Kitchen.. 1 By Mouth Two Times A Day For Diabetes 6)  Nexium 40 Mg Cpdr (Esomeprazole Magnesium) .... Take 1 Tab Each Morning 7)  Prodigy Pocket Blood Glucose W/device Kit (Blood Glucose Monitoring Suppl) .... Disp #1 Glucometer Kit.  Please Also Dispense Lancets #100 For Kit and Glucose Test Strips For Testing Once Daily, 1 Mo Supply. 8)  Oxycodone-Acetaminophen 5-325 Mg Tabs (Oxycodone-Acetaminophen) .Marland Kitchen.. 1 By Mouth Two Times A Day As Needed Severe Pain 9)  Proair Hfa 108 (90 Base) Mcg/act Aers (Albuterol Sulfate) .... 2 Puffs Every 4 Hours As Needed For Wheezing/asthma Flare 10)  Maxalt-Mlt 10 Mg Tbdp (Rizatriptan Benzoate) .... As Directed For Migraine 11)  Fluticasone Propionate 50 Mcg/act Susp (Fluticasone Propionate) .... 2 Sprays Each Nostril Once Daily.  Lean Forward When Using Medicine. 12)  Gabapentin 300 Mg Caps (Gabapentin) .Marland Kitchen.. 1 By Mouth At Bedtime For Shoulder Pain.  After 1 Week Increase To Two Times A Day 13)  Promethazine Hcl 25 Mg Tabs (Promethazine Hcl) .... 1/2 - 1 By Mouth Three Times A Day As Needed Nausea  Allergies (verified): No Known Drug Allergies  Past History:  Past medical, surgical, family and social histories (including  risk factors) reviewed for relevance to current acute and chronic problems.  Past Medical History: Reviewed history from 09/26/2008 and no changes required. Depression Diabetes mellitus, type II Migraines Arthritis in spine with history of facet joint injections (dr hilts) stomach ulcer abnormal thyroid test MRI lumbar spine 01/25/08 -- no spinal stenosis or nerve root encroachment, bilateral facet hypertrophy @ l4-5 and s1, low t1 marrow signal nonspecific and may represent a normal variant or manifestation of smoking or med use  Past Surgical History: Reviewed history from 09/22/2008 and no changes  required. Tubal ligation-1995 Axillary sweat glands and lymph nodes removed bilaterally 2001  Family History: Reviewed history from 05/09/2009 and no changes required. Rectal Cancer - mother Family History Breast cancer 1st degree relative <50- sister age 55 Family History of CAD Female 1st degree relative <50- father MI x5  Family History Diabetes 1st degree relative- parents and sister family history of hypertenion family history of gout (Grandfather)  Social History: Reviewed history from 09/26/2008 and no changes required. Pt lives with her daughter and son ages 42 and 58, and grandson age 87.  She is currently unemployed.  Enjoys reading.  [place] from [place].  moved here to [place] in 2007.  disabled 2/2 depression/anxietyPacks/Day:  0.25  Review of Systems       per HPI  Physical Exam  General:  no acute distress, alert. pleasant.  very difficult to focus.  VS reviewed Lungs:  Normal respiratory effort, chest expands symmetrically. Lungs are clear to auscultation, no crackles or wheezes. Heart:  Normal rate and regular rhythm. S1 and S2 normal without gallop, murmur, click, rub or other extra sounds. Msk:  right side paraspinal muscle in spasm.  no erythema, no swelling tender to touch Extremities:  full sensation  Neurologic:  strenght 5/5 bilateral lower extremities.  patellar and achilles reflexes WNL bialterally Psych:  hard to focus.  poor eye contact.  often rolling her eyes at me   Impression & Recommendations:  Problem # 1:  LOW BACK PAIN, CHRONIC (ICD-724.2) Assessment Unchanged  again encouraged use of neurontin and also suggested capsacin and exercise.  no changes today.   Her updated medication list for this problem includes:    Methocarbamol 750 Mg Tabs (Methocarbamol) .Marland Kitchen... 1 tablet by mouth three times a day    Nabumetone 500 Mg Tabs (Nabumetone) .Marland Kitchen... 1 tablet 2-3 times daily with food    Oxycodone-acetaminophen 5-325 Mg Tabs  (Oxycodone-acetaminophen) .Marland Kitchen... 1 by mouth two times a day as needed severe pain  Orders: [facility]- Est Level  3 ([address])  Problem # 2:  DIABETES MELLITUS, TYPE II (ICD-250.00) Assessment: Unchanged  due for many preventative services (pap, mammo, fasting lipids, eye examination) but will go ahead with podiatry referral for foot care of diabetic.  she is aware she is due but continues to put things off.   Her updated medication list for this problem includes:    Metformin Hcl 1000 Mg Tabs (Metformin hcl) .Marland Kitchen... 1 by mouth two times a day for diabetes  Orders: Podiatry Referral (Podiatry) [facility] Est Level  3 [phone])  Labs Reviewed: Creat: 0.80 (09/26/2008)     Last Eye Exam: No diabetic retinopathy.    (02/03/2009) Reviewed HgBA1c results: 6.1 (09/08/2009)  6.2 (05/09/2009)  Complete Medication List: 1)  Topamax 25 Mg Tabs (Topiramate) .Marland Kitchen.. 1 tablet by mouth three times a day - states she is taking it prn 2)  Methocarbamol 750 Mg Tabs (Methocarbamol) .Marland Kitchen.. 1 tablet by mouth three times a day  3)  Nabumetone 500 Mg Tabs (Nabumetone) .Marland Kitchen.. 1 tablet 2-3 times daily with food 4)  Cetirizine-pseudoephedrine 5-120 Mg Xr12h-tab (Cetirizine-pseudoephedrine) .Marland Kitchen.. 1 by mouth once daily for allergies/congestion 5)  Metformin Hcl 1000 Mg Tabs (Metformin hcl) .Marland Kitchen.. 1 by mouth two times a day for diabetes 6)  Nexium 40 Mg Cpdr (Esomeprazole magnesium) .... Take 1 tab each morning 7)  Prodigy Pocket Blood Glucose W/device Kit (Blood glucose monitoring suppl) .... Disp #1 glucometer kit.  please also dispense lancets #100 for kit and glucose test strips for testing once daily, 1 mo supply. 8)  Oxycodone-acetaminophen 5-325 Mg Tabs (Oxycodone-acetaminophen) .Marland Kitchen.. 1 by mouth two times a day as needed severe pain 9)  Proair Hfa 108 (90 Base) Mcg/act Aers (Albuterol sulfate) .... 2 puffs every 4 hours as needed for wheezing/asthma flare 10)  Maxalt-mlt 10 Mg Tbdp (Rizatriptan benzoate) .... As directed for  migraine 11)  Fluticasone Propionate 50 Mcg/act Susp (Fluticasone propionate) .... 2 sprays each nostril once daily.  lean forward when using medicine. 12)  Gabapentin 300 Mg Caps (Gabapentin) .Marland Kitchen.. 1 by mouth at bedtime for shoulder pain.  after 1 week increase to two times a day 13)  Promethazine Hcl 25 Mg Tabs (Promethazine hcl) .... 1/2 - 1 by mouth three times a day as needed nausea  Patient Instructions: 1)  Please start taking the neurontin as directed.  I truly think it will help your discomfort greatly. 2)  We will put in the referral for the podiatrist for you. 3)  Try some capsacian rub for your lower back pain - it will particularly help the muscle tightness and pain.  4)  I sent your phenergan for your headache [facility] to [facility] [address] Prescriptions: PROMETHAZINE HCL 25 MG TABS (PROMETHAZINE HCL) 1/2 - 1 by mouth three times a day as needed nausea  #30 x 1   Entered and Authorized by:   Ancil Boozer  MD   Signed by:   Ancil Boozer  MD on 10/26/2009   Method used:   [facility]ronically to        [facility] [address] [phone]* (retail)       [address]       [place], [place]  [address]       Ph: [phone] or [phone]       Fax: [phone]   RxID:   [id number]   Prevention & Chronic Care Immunizations   Influenza vaccine: Not documented    Tetanus booster: Not documented    Pneumococcal vaccine: Not documented  Other Screening   Pap smear: Not documented    Mammogram: Not documented   Smoking status: current  (10/26/2009)  Diabetes Mellitus   HgbA1C: 6.1  (09/08/2009)   Hemoglobin A1C due: 12/22/2008    Eye exam: No diabetic retinopathy.     (02/03/2009)   Eye exam due: 02/03/2010    Foot exam: yes  (05/24/2009)   High risk foot: Not documented   Foot care education: completed  (10/24/2008)   Foot exam due: 10/24/2009    Urine microalbumin/creatinine ratio: Not documented   Urine microalbumin action/deferral: Not  indicated    Diabetes flowsheet reviewed?: Yes   Progress toward A1C goal: At goal  Lipids   Total Cholesterol: 135  (09/26/2008)   LDL: 62  (09/26/2008)   LDL Direct: Not documented   HDL: 41  (09/26/2008)   Triglycerides: 160  (09/26/2008)  Self-Management Support :   Personal Goals (  by the next clinic visit) :     Personal A1C goal: 8  (05/24/2009)     Personal blood pressure goal: 130/80  (05/24/2009)     Personal LDL goal: 100  (05/24/2009)    Diabetes self-management support: Not documented    Diabetes self-management support not done because: Good outcomes  (05/24/2009)   Last diabetes self-management training by diabetes educator: completed

## 2010-07-24 NOTE — Miscellaneous (Signed)
Summary: Re: Cough med  Clinical Lists Changes Patient walked into clinic this afternoon and states that the cough med sent in for her costs $60 and wants something different [facility], message to MD...............................................Marland KitchenGaren Grams LPN March 28, 2009 3:30 PM  Her best bet because none of them are covered by medicare is to get an OTC cough medicine such as robitussin DM.   Ancil Boozer  MD  March 28, 2009 3:38 PM  Tried calling to inform patient but the number we have is disconnected, will await callback from patient...............................................Marland KitchenGaren Grams LPN March 28, 2009 3:41 PM     Appended Document: Re: Cough med Spoke with patient, who got upset and stated that she will call medicare

## 2010-07-24 NOTE — Assessment & Plan Note (Signed)
Summary: painful knot on neck/[place]/carew   Vital Signs:  Patient profile:   45 year old female Weight:      197 pounds Temp:     98.0 degrees F oral Pulse rate:   88 / minute BP sitting:   110 / 72  (left arm)  Vitals Entered By: Jimmy Footman, CMA (December 26, 2009 4:04 PM) CC: knot  and neck pain x 3 days Pain Assessment Patient in pain? yes     Location: neck Intensity: 10 Type: sharp   Primary Care Provider:  Bobby Rumpf  MD  CC:  knot  and neck pain x 3 days.  History of Present Illness: Pt with 3 days of neck pain on Right side of neck.  this pain is similar to previous episodes that typically go away with the meds she has at home.  However this pain did not go away with methocarbol or her pain medicines.  she is using ice to area with no help.  no injury,  no exertion, no weight loss, no weakness in arms or legs.  Habits & Providers  Alcohol-Tobacco-Diet     Tobacco Status: current  Current Medications (verified): 1)  Topamax 25 Mg Tabs (Topiramate) .Marland Kitchen.. 1 Tablet By Mouth Three Times A Day For Headache Prevention 2)  Methocarbamol 750 Mg Tabs (Methocarbamol) .Marland Kitchen.. 1 Tablet By Mouth Three Times A Day 3)  Nabumetone 500 Mg Tabs (Nabumetone) .Marland Kitchen.. 1 Tablet 2-3 Times Daily With Food 4)  Cetirizine-Pseudoephedrine 5-120 Mg Xr12h-Tab (Cetirizine-Pseudoephedrine) .Marland Kitchen.. 1 By Mouth Once Daily For Allergies/congestion 5)  Metformin Hcl 500 Mg Tabs (Metformin Hcl) .... 2 By Mouth Two Times A Day For Diabetes Control. 6)  Nexium 40 Mg Cpdr (Esomeprazole Magnesium) .... Take 1 Tab Each Morning 7)  Prodigy Pocket Blood Glucose W/device Kit (Blood Glucose Monitoring Suppl) .... Disp #1 Glucometer Kit.  Please Also Dispense Lancets #100 For Kit and Glucose Test Strips For Testing Once Daily, 1 Mo Supply. 8)  Oxycodone-Acetaminophen 5-325 Mg Tabs (Oxycodone-Acetaminophen) .Marland Kitchen.. 1 By Mouth Two Times A Day As Needed Severe Pain 9)  Proair Hfa 108 (90 Base) Mcg/act Aers (Albuterol Sulfate) .... 2  Puffs Every 4 Hours As Needed For Wheezing/asthma Flare 10)  Maxalt-Mlt 10 Mg Tbdp (Rizatriptan Benzoate) .... As Directed For Migraine 11)  Fluticasone Propionate 50 Mcg/act Susp (Fluticasone Propionate) .... 2 Sprays Each Nostril Once Daily.  Lean Forward When Using Medicine. 12)  Gabapentin 300 Mg Caps (Gabapentin) .Marland Kitchen.. 1 By Mouth Two Times A Day For Pain 13)  Promethazine Hcl 25 Mg Tabs (Promethazine Hcl) .... 1/2 - 1 By Mouth Three Times A Day As Needed Nausea 14)  Lisinopril 5 Mg Tabs (Lisinopril) .Marland Kitchen.. 1 Tablet Daily For Kidney Protection 15)  Alprazolam 0.5 Mg Tabs (Alprazolam) .Marland Kitchen.. 1 By Mouth Three Times A Day As Needed Severe Anxiety.  May Refill Every 30 Days  Allergies (verified): No Known Drug Allergies  Past History:  Past Medical History: Last updated: 12/07/2009 Depression and mental illness NOS- has been seen at mental health but doesn't like to go to them so often seeks [facility].  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  has pain contract h/o stomach ulcer h/o abnormal thyroid test and with thyroid nodules on ultrasound - due for follow up ultrasound 03/2010 MRI  lumbar spine 01/25/08 -- no spinal stenosis or nerve root encroachment, bilateral facet hypertrophy @ l4-5 and s1, low t1 marrow signal nonspecific and may represent a normal variant or manifestation of smoking or med use  compliance issues.   Review of Systems  The patient denies anorexia, fever, weight loss, and chest pain.    Physical Exam  General:  uncomfortable appearing Head:  Normocephalic and atraumatic without obvious abnormalities. No apparent alopecia or balding. Neck:  neck held to left side.  paraspinus muscles feel tight.  Pt seems to have full movt of neck, however movt seems to  agrivate pain   Impression & Recommendations:  Problem # 1:  NECK PAIN, RIGHT (ICD-723.1) Assessment Deteriorated this pain is lasting longer than before.  no recent neck films so will get these today.  will give skelaxan to try as muscle relaxant.  recommend PT but pt skeptical.  recommend heat not ice.  Gave red flags. Her updated medication list for this problem includes:    Methocarbamol 750 Mg Tabs (Methocarbamol) .Marland Kitchen... 1 tablet by mouth three times a day    Nabumetone 500 Mg Tabs (Nabumetone) .Marland Kitchen... 1 tablet 2-3 times daily with food    Oxycodone-acetaminophen 5-325 Mg Tabs (Oxycodone-acetaminophen) .Marland Kitchen... 1 by mouth two times a day as needed severe pain  Orders: Diagnostic X-Ray/Fluoroscopy (Diagnostic X-Ray/Flu) Cervical collar- [facility] (L0210) [facility]- Est Level  3 (54098)  Complete Medication List: 1)  Topamax 25 Mg Tabs (Topiramate) .Marland Kitchen.. 1 tablet by mouth three times a day for headache prevention 2)  Methocarbamol 750 Mg Tabs (Methocarbamol) .Marland Kitchen.. 1 tablet by mouth three times a day 3)  Nabumetone 500 Mg Tabs (Nabumetone) .Marland Kitchen.. 1 tablet 2-3 times daily with food 4)  Cetirizine-pseudoephedrine 5-120 Mg Xr12h-tab (Cetirizine-pseudoephedrine) .Marland Kitchen.. 1 by mouth once daily for allergies/congestion 5)  Metformin Hcl 500 Mg Tabs (Metformin hcl) .... 2 by mouth two times a day for diabetes control. 6)  Nexium 40 Mg Cpdr (Esomeprazole magnesium) .... Take 1 tab each morning 7)  Prodigy Pocket Blood Glucose W/device Kit (Blood glucose monitoring suppl) .... Disp #1 glucometer kit.  please also dispense lancets #100 for kit and glucose test strips for testing once daily, 1 mo supply. 8)  Oxycodone-acetaminophen 5-325 Mg Tabs (Oxycodone-acetaminophen) .Marland Kitchen.. 1 by mouth two times a day as needed severe pain 9)  Proair Hfa 108 (90 Base) Mcg/act Aers (Albuterol sulfate) .... 2 puffs every 4 hours as needed for wheezing/asthma flare 10)  Maxalt-mlt 10 Mg Tbdp (Rizatriptan benzoate) .... As directed for  migraine 11)  Fluticasone Propionate 50 Mcg/act Susp (Fluticasone propionate) .... 2 sprays each nostril once daily.  lean forward when using medicine. 12)  Gabapentin 300 Mg Caps (Gabapentin) .Marland Kitchen.. 1 by mouth two times a day for pain 13)  Promethazine Hcl 25 Mg Tabs (Promethazine hcl) .... 1/2 - 1 by mouth three times a day as needed nausea 14)  Lisinopril 5 Mg Tabs (Lisinopril) .Marland Kitchen.. 1 tablet daily for kidney protection 15)  Alprazolam 0.5 Mg Tabs (Alprazolam) .Marland Kitchen.. 1 by mouth three times a day as needed severe anxiety.  may refill every 30 days  Patient Instructions: 1)  come back to see Dr. Wallene Huh if this isn't better in 1-2 weeks. 2)  I will call you with results from your neck xrays 3)  Call if you have weakness in your arm

## 2010-07-24 NOTE — Miscellaneous (Signed)
Summary: Urgent care note 09/16/08                          PHYSICIAN DOCUMENTATION SHEET          Fri Mar 26 19:08:33 EDT 2010            [place]       [address]       [place], [place] [address]        PHONE: [phone]      MRN:  [id number]        Account #: [id number]   Name: Kendra Hall, Kendra Hall       Sex: F   Age: 45         DOB: 1966/01/27   Complaint: Abscess       Primary Diagnosis: Abscess   Arrival Time: 09/16/2008 16:19      Discharge Time: 09/16/2008 19:08   All Providers: Ms. Langston Masker - PA   --------------------------------------------------------------------------------------         Josefita Weissmann: Ms. Langston Masker - PA        HPI:       The  patient  is  a  45 year old  female  who  presents with a chief complaint of       abscess. The history was provided by the patient. Pt complains of an  abscess  on       bottom.   Pt  reports area is very painful,  2 pustules/swollen areas The abscess       started several days ago. The onset was gradual. The  Course  is  worsening.  The       complaint  is  described  as  a(n) pain, bleeding and swelling. The symptoms are       described as moderate. The condition is not aggravated by nothing. The  condition       is not relieved by nothing. There has been no associated no other complaints. The       patient has a significant history of serious medical conditions and similar symp-       toms previously.     18:29 09/16/2008 by Langston Masker - PA, Ms.           ROS:       Statement: all systems negative except as marked or noted in the HPI       Constitutional: all Negative       Eyes: all Negative       Cardiovascular: all Negative       Respiratory: all Negative       Gastrointestinal: all Negative       Musculoskeletal: Positive for swelling.       Skin: all Negative       Neuro: all Negative       Psychiatric: all Negative       Metabolic: all Negative     18:30 09/16/2008 by Langston Masker -  PA, Ms.           PMH:       Documentation: physician assistant reviewed/amended       Historian: patient          +--------------------------------------------------------------------------------+       +         Patient's Current Physicians        +       +--------------------------------------------------------------------------------+       +  Patient's Current Physicians (please list       +       +   PCP first)           +       +--------------------------------------------------------------------------------+       +   *No PCP, Per pt./family/records         +       +--------------------------------------------------------------------------------+          Past medical history: anxiety, arthritis, back pain chronic, depression, migraine       Social History: non-drinker, no drug abuse, current smoker w/i last 12 mos.          +--------------------------------------------------------------------------------+       +     Allergies         +       +------------------------+--------------------------+----------------------------+       +      Drug      +       Reaction  +   Allergy Note      +       +------------------------+--------------------------+----------------------------+       +    Percocet      +    +        +       +------------------------+--------------------------+----------------------------+     18:27 09/16/2008 by Langston Masker - PA, Ms.           Home Medications:       Documentation: physician assistant reviewed/amended          +--------------------------------------------------------------------------------+       +          Medications         +       +---------------------+------------------+------------------------+--------------+       +  Medication   +   [Medication]   +      Frequency       +  Last Dose   +       +     +   Dosage      +         +       +       +---------------------+------------------+------------------------+--------------+       +  NexIUM Oral   +   40mg       +  once a day       +       +       +---------------------+------------------+------------------------+--------------+       + Robaxin Oral   +   750mg       +  as needed       +       +       +---------------------+------------------+------------------------+--------------+       + ToPAMax Oral   +   25mg       +  three times a day    +       +       +---------------------+------------------+------------------------+--------------+       + Relafen Oral   +   500mg       +  as needed       +       +       +---------------------+------------------+------------------------+--------------+       + Phenergan Oral   +       +  as needed       +       +       +---------------------+------------------+------------------------+--------------+       +  Maxalt Oral   +       +  as needed       +       +       +---------------------+------------------+------------------------+--------------+       + Clorazepate   +       +  as needed       +       +       + Dipotassium Oral   +       +         +       +       +---------------------+------------------+------------------------+--------------+       + XANax Oral   +       +         +       +       +---------------------+------------------+------------------------+--------------+       + ALLegra Oral   +       +         +       +       +---------------------+------------------+------------------------+--------------+        NOTE - Clorazepate     18:27 09/16/2008 by Langston Masker - PA, Ms.           Physical examination:       Vital signs and O2 SAT: reviewed, normal       Constitutional: well developed, well nourished, well hydrated       Head and Face: normocephalic       Eyes: normal appearance       Cardiovascular: regular rate and rhythm       Respiratory: normal       Chest: nontender       Abdomen: soft       Extremities: normal       Neuro: AA T Ox3       Skin: color normal      NOTE - swollen area perinel/groin area   1 large pustule,  small pustule and center     fluctuant area,  5/6 cm area of swelling.     18:32 09/16/2008 by Langston Masker - PA, Ms.           Reviewed result:       Result Type: Cleda Daub: [id number]       Step Type: LAB       Procedure Name: URINE BIOCHEMICALS, POC        Procedure: URINE BIOCHEMICALS, POC        Procedure Notes: LEUKOCYTE  ESTERASE  - Biochemical Testing Only. Please order       routine urinalysis from main lab if confirmatory testing is needed.        Result:        URINE GLUCOSE   500       mg/dL  [NEG]       A        URINE BILIRUBIN   NEGATIVE   [NEG]        URINE KETONES   NEGATIVE    mg/dL  [NEG]        URINE SPEC GRAVITY   <=1.005   [1.005-1.030]        URINE HEMOGLOBIN   NEGATIVE   [NEG]        URINE PH    5.5    [5.0-8.0]  URINE TOTAL PROTEIN  NEGATIVE    mg/dL  [NEG]        URINE UROBILINOGEN   0.2       mg/dL  [1.6-1.0]        URINE NITRITE   NEGATIVE   [NEG]        LEUKOCYTE ESTERASE   NEGATIVE   [NEG]           18:28 09/16/2008 by Langston Masker - PA, Ms.        Reviewed result:       Result Type: Cleda Daub: [id number]       Step Type: LAB       Procedure Name: I STAT CHM 8 PANEL        Procedure: I STAT CHM 8 PANEL        Result:        SODIUM    135       mEq/L  [135-145]        POTASSIUM    4.1       mEq/L  [3.5-5.1]         CHLORIDE    96       mEq/L  [96-112]        BUN    10       mg/dL  [8-11]        CREATININE    1.0       mg/dL  [9.1-4.7]        GLUCOSE    468       mg/dL  [82-95]      H        CALCIUM, IONIZED   1.13       mmol/L  [1.12-1.32]        TCO2    30       mmol/L  [0-100]        HEMOGLOBIN    14.6       g/dL  [62.1-30.8]        HEMATOCRIT    43.0       %   [36.0-46.0]           18:32 09/16/2008 by Langston Masker - PA, Ms.           ED Course:         I and D:    abscess    Location: right perineal region        +----------------------------------------------------------------------------+    +     Wound prep         +    +---------------------+-----------------------+--------------+---------------+    + Prep       +  Agent       +    Amount    +   Comments   +    +---------------------+-----------------------+--------------+---------------+    +  skin antiseptic    +    povidone-iodine    +       +       +    +---------------------+-----------------------+--------------+---------------+       Procedure: incision and drainage of large amount of pus    Anesthesia: local infiltration    Agent: lidocaine 2%    Packing: iodoform gauze 0.5          NOTE - I drained large area of infection, 1 small pimple,  small  incision  to         center of area,  no drainage/  cellulitic.          Comments:  Pt had 2 large glasses of sweat tea before coming in.  Pt has had fre-       quent urination for the last 3 days.    I counseled pt on  diabetes,   Pt  has  a       family history     18:35 09/16/2008 by Langston Masker - PA, Ms.           Patient disposition:       Patient disposition: Disch - Home       Primary Diagnosis: abscess       Additional diagnoses: hyperglycemia            Counseling:  advised  of diagnosis, advised of treatment plan, advised of xray         and lab findings, advised of need for  close  follow-up, advised  of  need  to         return for worsening or changing symptoms     18:38 09/16/2008 by Langston Masker - PA, Ms.           Prescriptions:            +------------------------------------------------------------------------------+         +           Prescription         +         +----------------------------+---------------------+---------------------------+         +        Medication    +   Dispense  +     Sig Line      +         +----------------------------+---------------------+---------------------------+         +   Percocet 5 mg-325 mg    + Twenty (20)  +    One-two  every six      +         +   Tab      +    +    hours as needed for    +         +       +    +    pain.       +         +----------------------------+---------------------+---------------------------+            Drug interactions:       +----------------------------------------------------------------------------+    +         Drug interactions         +    +--------------+----------------------+-------------+------------------------+    +        +        +      +        +    +--------------+----------------------+-------------+------------------------+    +  Allergy     +    Percocet Oral     +      +  Allergy to Perco-   +    +        +        +      +  cet       +    +--------------+----------------------+-------------+------------------------+     18:39 09/16/2008 by Langston Masker - PA, Ms.           Medication disposition:          +--------------------------------------------------------------------------------+       +  Medications         +       +-------------+--------------+------------+-----------+-------------+------------+       + Medication  +[Medication]  + Frequency  + Last Dose +Medication +PCP contact +       +    +Dosage  +       +    +disposition +      +       +-------------+--------------+------------+-----------+-------------+------------+       +NexIUM Oral  +40mg    +once a day  +    +continue +      +       +-------------+--------------+------------+-----------+-------------+------------+       +Robaxin Oral +750mg   +as needed   +    +continue +      +       +-------------+--------------+------------+-----------+-------------+------------+       +ToPAMax Oral +25mg    +three times +    +continue +      +       +    +   +a day       +    +  +      +       +-------------+--------------+------------+-----------+-------------+------------+       +Relafen Oral +500mg   +as needed   +    +continue +      +        +-------------+--------------+------------+-----------+-------------+------------+       +Phenergan   +   +as needed   +    +continue +      +       +Oral   +   +       +    +  +      +       +-------------+--------------+------------+-----------+-------------+------------+       +Maxalt Oral  +   +as needed   +    +continue +      +       +-------------+--------------+------------+-----------+-------------+------------+       +Clorazepate  +   +as needed   +    +continue +      +       +Dipotassium  +   +       +    +  +      +       +Oral   +   +       +    +  +      +       +-------------+--------------+------------+-----------+-------------+------------+       +XANax Oral   +   +       +    +continue +      +       +-------------+--------------+------------+-----------+-------------+------------+       +ALLegra Oral +   +       +    +continue +      +       +-------------+--------------+------------+-----------+-------------+------------+     18:38 09/16/2008 by Langston Masker - PA, Ms.           Discharge:       Discharge Instructions:         abscess - with antibiotics mrsa suspected          Append  a  Note  to  Discharge  Instructions:  Return here tommorow afternoon for       recheck.          +--------------------------------------------------------------------------------+       +  Referral/Appointment         +       +----------------------+--------------------+-------------------+----------------+       +  Refer Patient To:   +   Phone Number: +   Follow-up in    +  Appointment   +       +      +   +      +  Details:      +       +----------------------+--------------------+-------------------+----------------+       +  [facility]-    +   [phone]  +      +       +       +  Family Practice    +   +      +       +       +----------------------+--------------------+-------------------+----------------+       +  [facility]-    +   [phone]  +      +       +        +  ment Program    +   +      +       +       +----------------------+--------------------+-------------------+----------------+       +  [facility]-    +   [phone]  +      +       +       +  ment Center    +   +      +       +       +----------------------+--------------------+-------------------+----------------+          Drug Instructions:         antibiotic cephalosporin, antibiotic tcn, endocrine dm oral     18:41 09/16/2008 by Langston Masker - PA, Ms.

## 2010-07-24 NOTE — Assessment & Plan Note (Signed)
Summary: pain control/kh   Vital Signs:  Patient profile:   45 year old female Height:      63.5 inches Weight:      194 pounds BMI:     33.95 Temp:     98.1 degrees F oral Pulse rate:   80 / minute BP sitting:   130 / 77  (left arm) Cuff size:   large  Vitals Entered By: Tessie Fass CMA (October 19, 2009 8:45 AM) CC: lower back pain Is Patient Diabetic? Yes Pain Assessment Patient in pain? yes     Location: lower back Intensity: 8   Primary Care Provider:  Ancil Boozer  MD  CC:  lower back pain.  History of Present Illness: 45 yo female here with acute on chronic LBP.  Pt has long time hx of osteoarthritis.  Pt states that the pain worsen when the weather change.  Pt is on NSAIDS and percocet for pain.  Pt only take percocet sledomnly.  (last rx was 3/18 #45) but ran out two days ago.  Pt states that the pain is mostly on the right side, stays localized no radiation or numbness.  No loss of bowel or bladder.  Pt cannot think or an acute event other than the weather that exacerbated the pain.   Pt is not taking her neurontin due to when she read the label she was scared of haiving a seizure.   Pt is taking all her other medications regularly.    Current Medications (verified): 1)  Topamax 25 Mg Tabs (Topiramate) .Marland Kitchen.. 1 Tablet By Mouth Three Times A Day - States She Is Taking It Prn 2)  Methocarbamol 750 Mg Tabs (Methocarbamol) .Marland Kitchen.. 1 Tablet By Mouth Three Times A Day 3)  Nabumetone 500 Mg Tabs (Nabumetone) .Marland Kitchen.. 1 Tablet 2-3 Times Daily With Food 4)  Cetirizine-Pseudoephedrine 5-120 Mg Xr12h-Tab (Cetirizine-Pseudoephedrine) .Marland Kitchen.. 1 By Mouth Once Daily For Allergies/congestion 5)  Metformin Hcl 1000 Mg Tabs (Metformin Hcl) .Marland Kitchen.. 1 By Mouth Two Times A Day For Diabetes 6)  Nexium 40 Mg Cpdr (Esomeprazole Magnesium) .... Take 1 Tab Each Morning 7)  Prodigy Pocket Blood Glucose W/device Kit (Blood Glucose Monitoring Suppl) .... Disp #1 Glucometer Kit.  Please Also Dispense Lancets  #100 For Kit and Glucose Test Strips For Testing Once Daily, 1 Mo Supply. 8)  Oxycodone-Acetaminophen 5-325 Mg Tabs (Oxycodone-Acetaminophen) .Marland Kitchen.. 1 By Mouth Two Times A Day As Needed Severe Pain 9)  Proair Hfa 108 (90 Base) Mcg/act Aers (Albuterol Sulfate) .... 2 Puffs Every 4 Hours As Needed For Wheezing/asthma Flare 10)  Maxalt-Mlt 10 Mg Tbdp (Rizatriptan Benzoate) .... As Directed For Migraine 11)  Fluticasone Propionate 50 Mcg/act Susp (Fluticasone Propionate) .... 2 Sprays Each Nostril Once Daily.  Lean Forward When Using Medicine. 12)  Gabapentin 300 Mg Caps (Gabapentin) .Marland Kitchen.. 1 By Mouth At Bedtime For Shoulder Pain.  After 1 Week Increase To Two Times A Day  Allergies (verified): No Known Drug Allergies  Past History:  Past medical, surgical, family and social histories (including risk factors) reviewed, and no changes noted (except as noted below).  Past Medical History: Reviewed history from 09/26/2008 and no changes required. 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 Tubal ligation-1995 Axillary sweat glands and lymph nodes removed bilaterally 2001  Family History: Reviewed history from 05/09/2009 and no changes required. Rectal Cancer - mother Family History Breast cancer 1st degree relative <50- sister age 73 Family History of CAD Female 1st degree relative <50- father MI x5  Family History Diabetes 1st degree relative- parents and sister family history of hypertenion family history of gout (Grandfather)  Social History: Reviewed history from 09/26/2008 and  no changes required. Pt lives with her daughter and son ages 32 and 59, and grandson age 71.  She is currently unemployed.  Enjoys reading.  [place]iginally from [place].  moved here to [place] in 2007.  disabled 2/2 depression/anxiety  Review of Systems       denies fever, chills, nausea, vomiting, diarrhea or constipation   Physical Exam  General:  no acute distress, alert. pleasant.  very difficult to focus.  VS reviewed Eyes:  PERRL.  sclera clear.   Mouth:  poor dentition with significant dental work in the past.  MMM Lungs:  Normal respiratory effort, chest expands symmetrically. Lungs are clear to auscultation, no crackles or wheezes. Heart:  Normal rate and regular rhythm. S1 and S2 normal without gallop, murmur, click, rub or other extra sounds. Msk:  right side paraspinal muscle in spasm.  no erythema, no swelling L2-4 rotated and sidebent right.   T5 RS left Extremities:  full sensation  Neurologic:  2-12 intact   Impression & Recommendations:  Problem # 1:  LOW BACK PAIN, CHRONIC (ICD-724.2) Assessment Comment Only  Manipulated today 50-60% improvement  Given prescription for #45 percocet today.  Pt will attempt to start her neurontin.  Will f/u with Dr. Sandi Mealy at her earliest appoinment.  Pain contract reviewed with pt today as well.   Her updated medication list for this problem includes:    Methocarbamol 750 Mg Tabs (Methocarbamol) .Marland Kitchen... 1 tablet by mouth three times a day    Nabumetone 500 Mg Tabs (Nabumetone) .Marland Kitchen... 1 tablet 2-3 times daily with food    Oxycodone-acetaminophen 5-325 Mg Tabs (Oxycodone-acetaminophen) .Marland Kitchen... 1 by mouth two times a day as needed severe pain  Orders: [facility]- Est Level  3 (99213) OMT 1-2 [place] (71062)  Complete Medication List: 1)  Topamax 25 Mg Tabs (Topiramate) .Marland Kitchen.. 1 tablet by mouth three times a day - states she is taking it prn 2)  Methocarbamol 750 Mg Tabs (Methocarbamol) .Marland Kitchen.. 1 tablet by mouth three times a day 3)  Nabumetone  500 Mg Tabs (Nabumetone) .Marland Kitchen.. 1 tablet 2-3 times daily with food 4)  Cetirizine-pseudoephedrine 5-120 Mg Xr12h-tab (Cetirizine-pseudoephedrine) .Marland Kitchen.. 1 by mouth once daily for allergies/congestion 5)  Metformin Hcl 1000 Mg Tabs (Metformin hcl) .Marland Kitchen.. 1 by mouth two times a day for diabetes 6)  Nexium 40 Mg Cpdr (Esomeprazole magnesium) .... Take 1 tab each morning 7)  Prodigy Pocket Blood Glucose W/device Kit (Blood glucose monitoring suppl) .... Disp #1 glucometer kit.  please also dispense lancets #100 for kit and glucose test strips for testing once daily, 1 mo supply. 8)  Oxycodone-acetaminophen 5-325 Mg Tabs (Oxycodone-acetaminophen) .Marland Kitchen.. 1 by mouth two times a day as needed severe pain 9)  Proair Hfa 108 (90 Base) Mcg/act Aers (Albuterol sulfate) .... 2 puffs every 4 hours as needed for wheezing/asthma flare 10)  Maxalt-mlt 10 Mg Tbdp (Rizatriptan benzoate) .... As directed for migraine 11)  Fluticasone Propionate 50 Mcg/act Susp (Fluticasone propionate) .Marland KitchenMarland KitchenMarland Kitchen  2 sprays each nostril once daily.  lean forward when using medicine. 12)  Gabapentin 300 Mg Caps (Gabapentin) .Marland Kitchen.. 1 by mouth at bedtime for shoulder pain.  after 1 week increase to two times a day  Patient Instructions: 1)  Nice to meet you 2)  I hope you feel better after manipulation  3)  If you need to you can use a heating pad.  You can use it for 20 minutes at a time.  DO NOT sleep with a heating pad 4)  I did give you a prescription for percocet 5)  Please make an appointment to see Dr. Sandi Mealy tomorrow if possible.   Prescriptions: OXYCODONE-ACETAMINOPHEN 5-325 MG TABS (OXYCODONE-ACETAMINOPHEN) 1 by mouth two times a day as needed severe pain  #45 x 0   Entered by:   Antoine Primas DO   Authorized by:   Luretha Murphy NP   Signed by:   Antoine Primas DO on 10/19/2009   Method used:   Handwritten   RxID:   [id number]

## 2010-07-24 NOTE — Miscellaneous (Signed)
Summary: [facility] Controlled Substance Contract  [facility] Contract   Imported By: Clydell Hakim 09/12/2009 16:28:51  _____________________________________________________________________  External Attachment:    Type:   Image     Comment:   External Document

## 2010-07-24 NOTE — Assessment & Plan Note (Signed)
Summary: sinus infection?/eo   Vital Signs:  Patient profile:   45 year old female Height:      63.5 inches Weight:      188 pounds BMI:     32.90 Temp:     98.4 degrees F oral Pulse rate:   62 / minute BP sitting:   131 / 83  (left arm) Cuff size:   large  Vitals Entered By: Garen Grams LPN (March 28, 2009 11:34 AM) CC: migraines, possible sinus infection Is Patient Diabetic? Yes  Pain Assessment Patient in pain? no        Primary Care Provider:  Ancil Boozer  MD  CC:  migraines and possible sinus infection.  History of Present Illness: migraines: had severe migraine last week and it took several days to get over.  had to take many medications including a trip to the ER to get IV medication to treat it.  migraine is now gone but has continued sinus pressure and pain.   sinus infection?: since migraine has had cough, nasal congestion and drainage, facial pain, tooth pain, jaw swelling and ear pain. denies fevers.  took some amoxicillin she had at home for the past few days with no relief.  otherwise hasn't been doing anything for this.    Current Medications (verified): 1)  Topamax 25 Mg Tabs (Topiramate) .Marland Kitchen.. 1 Tablet By Mouth Three Times A Day 2)  Methocarbamol 750 Mg Tabs (Methocarbamol) .Marland Kitchen.. 1 Tablet By Mouth Three Times A Day 3)  Nabumetone 500 Mg Tabs (Nabumetone) .Marland Kitchen.. 1 Tablet 2-3 Times Daily With Food 4)  Fexofenadine Hcl 180 Mg Tabs (Fexofenadine Hcl) .Marland Kitchen.. 1 Tablet By Mouth Once Daily 5)  Promethazine Hcl 25 Mg Tabs (Promethazine Hcl) .Marland Kitchen.. 1 Tablet By Mouth As Needed 6)  Alprazolam 0.5 Mg Tabs (Alprazolam) .Marland Kitchen.. 1 Tablet By Mouth As Needed 7)  Metformin Hcl 1000 Mg Tabs (Metformin Hcl) .Marland Kitchen.. 1 By Mouth Two Times A Day For Diabetes 8)  Nexium 40 Mg Cpdr (Esomeprazole Magnesium) .... Take 1 Tab Each Morning 9)  Prodigy Pocket Blood Glucose W/device Kit (Blood Glucose Monitoring Suppl) .... Disp #1 Glucometer Kit.  Please Also Dispense Lancets #100 For Kit and Glucose  Test Strips For Testing Once Daily, 1 Mo Supply. 10)  Oxycodone-Acetaminophen 5-325 Mg Tabs (Oxycodone-Acetaminophen) .Marland Kitchen.. 1 By Mouth Two Times A Day As Needed Severe Pain 11)  Proair Hfa 108 (90 Base) Mcg/act Aers (Albuterol Sulfate) .... 2 Puffs Every 4 Hours As Needed For Wheezing/asthma Flare 12)  Promethazine Hcl 25 Mg Tabs (Promethazine Hcl) .... 1/2 - 1 By Mouth Q8hr As Needed Nausea. 13)  Maxalt-Mlt 10 Mg Tbdp (Rizatriptan Benzoate) .... As Directed For Migraine 14)  Amoxicillin-Pot Clavulanate 875-125 Mg Tabs (Amoxicillin-Pot Clavulanate) .Marland Kitchen.. 1 By Mouth Two Times A Day For 10 Days. 15)  Tussionex Pennkinetic Er 8-10 Mg/47ml Lqcr (Chlorpheniramine-Hydrocodone) .... 5ml By Mouth Two Times A Day As Needed Cough. Disp 10 Day Supply.  Allergies (verified): No Known Drug Allergies  Past History:  Past medical, surgical, family and social histories (including risk factors) reviewed for relevance to current acute and chronic problems.  Past Medical History: Reviewed history from 09/26/2008 and no changes required. 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 Tubal ligation-1995 Axillary sweat glands and lymph nodes removed bilaterally 2001  Family History: Reviewed history from 09/26/2008 and no changes required. Rectal Cancer - mother Family History Breast cancer 1st degree relative <50- sister age 25 Family History of CAD Female 1st degree relative <50- father MI x5  Family History Diabetes 1st degree relative- parents and sister family history of hypertenion  Social  History: Reviewed history from 09/26/2008 and no changes required. Pt lives with her daughter and son ages 65 and 58, and grandson age 66.  She is currently unemployed.  Enjoys reading.  [place]iginally from [place].  moved here to [place] in 2007.  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  no purulence seen today. came into clinic with ear plug in R ear Nose:  boggy turbinates with rhinorrhea Mouth:  poor dentition with significant dental work in the past.  cavities and breakdown noted on lower and upper molars on R side. small amount of erythema around these molars Neck:  shotty LAD Lungs:  Normal respiratory effort, chest expands symmetrically. Lungs are clear to auscultation, no crackles or wheezes. Heart:  Normal rate and regular rhythm. S1 and S2 normal without gallop, murmur, click, rub or other extra sounds.   Impression & Recommendations:  Problem # 1:  SINUSITIS - ACUTE-NOS (ICD-461.9) Assessment New  should be able to cover this with augmentin as well as dental caries.  i think ear pain is from pressure backing up.   Her updated medication list for this problem includes:    Amoxicillin-pot Clavulanate 875-125 Mg Tabs (Amoxicillin-pot clavulanate) .Marland Kitchen... 1 by mouth two times a day for 10 days.    Tussionex Pennkinetic Er 8-10 Mg/66ml Lqcr (Chlorpheniramine-hydrocodone) .Marland KitchenMarland KitchenMarland KitchenMarland Kitchen 5ml by mouth two times a day as needed cough. disp 10 day supply.  Orders: [facility]- Est Level  3 ([address])  Problem # 2:  DENTAL CARIES (ICD-521.00) Assessment: Deteriorated  see #1  Orders: [facility]- Est Level  3 ([address])  Complete Medication List: 1)  Topamax 25 Mg Tabs (Topiramate) .Marland Kitchen.. 1 tablet by mouth three times a day 2)  Methocarbamol 750 Mg Tabs (Methocarbamol) .Marland Kitchen.. 1  tablet by mouth three times a day 3)  Nabumetone 500 Mg Tabs (Nabumetone) .Marland Kitchen.. 1 tablet 2-3 times daily with food 4)  Fexofenadine Hcl 180 Mg Tabs (Fexofenadine hcl) .Marland Kitchen.. 1 tablet by mouth once daily 5)  Promethazine Hcl 25 Mg Tabs (Promethazine hcl) .Marland Kitchen.. 1 tablet by mouth as needed 6)  Alprazolam 0.5 Mg Tabs (Alprazolam) .Marland Kitchen.. 1 tablet by mouth as needed 7)  Metformin Hcl 1000 Mg Tabs (Metformin hcl) .Marland Kitchen.. 1 by mouth two times a day for diabetes 8)  Nexium 40 Mg Cpdr (Esomeprazole magnesium) .... Take 1 tab each morning 9)  Prodigy Pocket Blood Glucose W/device Kit (Blood glucose monitoring suppl) .... Disp #1 glucometer kit.  please also dispense lancets #100 for kit and glucose test strips for testing once daily, 1 mo supply. 10)  Oxycodone-acetaminophen 5-325 Mg Tabs (Oxycodone-acetaminophen) .Marland Kitchen.. 1 by mouth two times a day as needed severe pain 11)  Proair Hfa 108 (90 Base) Mcg/act Aers (Albuterol sulfate) .... 2 puffs every 4 hours as needed for wheezing/asthma flare 12)  Promethazine Hcl 25 Mg Tabs (Promethazine hcl) .... 1/2 - 1 by mouth q8hr as needed nausea. 13)  Maxalt-mlt 10 Mg Tbdp (Rizatriptan benzoate) .... As directed for migraine 14)  Amoxicillin-pot Clavulanate 875-125 Mg Tabs (Amoxicillin-pot clavulanate) .Marland Kitchen.. 1 by mouth two times a day for 10 days. 15)  Tussionex Pennkinetic Er 8-10 Mg/79ml Lqcr (Chlorpheniramine-hydrocodone) .... 5ml by mouth two times a day as needed cough. disp 10 day supply.  Patient Instructions: 1)  Start taking the augmentin for your tooth and for your congestion and ears.   2)  You can also take some mucinex D over the counter to help with your congestion.  Prescriptions: TUSSIONEX PENNKINETIC ER 8-10 MG/5ML LQCR (CHLORPHENIRAMINE-HYDROCODONE) 5ml by mouth two times a day as needed cough. Disp 10 day supply.  #10 x 0   Entered and Authorized by:   Ancil Boozer  MD   Signed by:   Ancil Boozer  MD on 03/28/2009   Method used:   Print then Give to  Patient   RxID:   [id number] METFORMIN HCL 1000 MG TABS (METFORMIN HCL) 1 by mouth two times a day for diabetes  #60 x 11   Entered and Authorized by:   Ancil Boozer  MD   Signed by:   Ancil Boozer  MD on 03/28/2009   Method used:   [facility]        [facility] [address] [phone]* (retail)       [address]       [place], [place]  [address]       Ph: [phone] or [phone]       Fax: [phone]   RxID:   [id number] AMOXICILLIN-POT CLAVULANATE 875-125 MG TABS (AMOXICILLIN-POT CLAVULANATE) 1 by mouth two times a day for 10 days.  #20 x 0   Entered and Authorized by:   Ancil Boozer  MD   Signed by:   Ancil Boozer  MD on 03/28/2009   Method used:   [facility]        [facility] [address] [phone]* (retail)       [address]       [place], [place]  [address]       Ph: [phone] or [phone]       Fax: [phone]   RxID:   [id number] MAXALT-MLT 10 MG TBDP (RIZATRIPTAN BENZOATE) as directed for migraine  #9 x 11   Entered and Authorized by:   Ancil Boozer  MD   Signed by:   Ancil Boozer  MD on 03/28/2009   Method used:   Historical   RxID:   [id number]

## 2010-07-24 NOTE — Progress Notes (Signed)
Summary: Rx Prob  Phone Note Call from Patient Call back at Home Phone [phone]   Caller: Patient Summary of Call: Says she was to get a rx for a muscle relaxer called into pharmacy yesterday.  Pharmacy [facility] [address] Initial call taken by: Clydell Hakim,  December 27, 2009 12:00 PM  Follow-up for Phone Call        will forward message to Dr. Ayesha Mohair. Dr. Ayesha Mohair notified and she will do RX later today. tried to call patient to notifiy but unable to leave message. Follow-up by: Theresia Lo RN,  December 27, 2009 12:02 PM    New/Updated Medications: SKELAXIN 800 MG TABS (METAXALONE) take one by mouth q6 hours as needed muscle spasm Prescriptions: SKELAXIN 800 MG TABS (METAXALONE) take one by mouth q6 hours as needed muscle spasm  #90 x 1   Entered and Authorized by:   Ellery Plunk MD   Signed by:   Ellery Plunk MD on 12/27/2009   Method used:   [facility]        [facility] [address] [phone]* (retail)       [address]       [place], [place]  [address]       Ph: [phone] or [phone]       Fax: [phone]   RxID:   [id number]

## 2010-07-24 NOTE — Progress Notes (Signed)
  Phone Note Outgoing Call   Call placed by: Ellery Plunk MD,  December 27, 2009 8:46 PM Action Taken: Phone Call Completed Summary of Call: called pt to discuss xrays.  pt states that she is still in pain and can't move her neck.  [facility].  advised pt to call clinic to make appt with sports medicine. Initial call taken by: Ellery Plunk MD,  December 27, 2009 8:53 PM    Prescriptions: SKELAXIN 800 MG TABS (METAXALONE) take one by mouth q6 hours as needed muscle spasm  #90 x 5   Entered and Authorized by:   Ellery Plunk MD   Signed by:   Ellery Plunk MD on 12/27/2009   Method used:   Electronically to        [facility] [address] [phone]* (retail)       [address]       [place], [place]  [address]       Ph: [phone] or [phone]       Fax: [phone]   RxID:   [id number]

## 2010-07-24 NOTE — Progress Notes (Signed)
Summary: refill  Phone Note Refill Request Call back at Home Phone [phone] Message from:  Patient  Refills Requested: Medication #1:  OXYCODONE-ACETAMINOPHEN 5-325 MG TABS 1 by mouth two times a day as needed severe pain   Notes: wants to know if she can raise it to 7-325? Please call when ready  Initial call taken by: De Nurse,  October 18, 2009 1:59 PM  Follow-up for Phone Call        she walked in. told her we cannot fill it now. she has a pain contract so cannot get from ED. appt made for 8:30 am in work in. offered non-drug things to try such as hot bath or shower, massage, ice or heat. if trying heat use lowest temp for short time due to diabetes.  Follow-up by: Golden Circle RN,  October 18, 2009 5:02 PM

## 2010-07-24 NOTE — Assessment & Plan Note (Signed)
Summary: side pain,df   Vital Signs:  Patient profile:   45 year old female Height:      63.5 inches Weight:      195 pounds BMI:     34.12 BSA:     1.93 Temp:     98.5 degrees F Pulse rate:   83 / minute BP sitting:   141 / 72  Vitals Entered By: Jone Baseman CMA (September 08, 2009 3:19 PM) CC: L shoulder pain, DM, depression, flank pain Is Patient Diabetic? No Pain Assessment Patient in pain? yes     Location: back Intensity: 6   Primary Care Provider:  Ancil Boozer  MD  CC:  L shoulder pain, DM, depression, and flank pain.  History of Present Illness: L shoulder pain: unsure of when started.  radiates from neck and down to mid arm just above elbow.  throbbing in nature though not easy for her to describe.  doesn't state is electricin nature.  chronically there with occasionall worsening.  does feel sometimes like it is a little weak and has numbness/pins and needles sensation.  no known injury.  muscle relaxer and nambumatone have been no help.  when she had some percocet (she reports being out) and took 1/2 of one she states it helps.  states she hasn't had this problem before.    DM: rarely takes medication as prescribed just because she doesn't like to.  Doesn't check her sugars.  is frustatrated that she isn't losing weight anymore but admits to not eating as well or exercisign as well.  she was surprised that her A1C was still okay.   depression: notes increase in this.  hasn't been going to [facility] because she states she cant afford it. hasn't been on benzo as prescribed.  denies sucidial or homocidal ideation.  does not sleepiness but then insomnia. does notice trouble concentrating.    flank pain: states it happened a few weeks ago. was sharp in the right side and shooting.  then moved to left side and was dull.  is gone now.  denies any changes in urine or stool frequency or consistancy.  no fevers.  no blood in urine or stool.   she mentions as the end also  some concern about her swallowing - i advised a follow up appointment to completely discuss  Habits & Providers  Alcohol-Tobacco-Diet     Tobacco Status: current     Tobacco Counseling: to quit use of tobacco products     Cigarette Packs/Day: 0.25  Current Medications (verified): 1)  Topamax 25 Mg Tabs (Topiramate) .Marland Kitchen.. 1 Tablet By Mouth Three Times A Day - States She Is Taking It Prn 2)  Methocarbamol 750 Mg Tabs (Methocarbamol) .Marland Kitchen.. 1 Tablet By Mouth Three Times A Day 3)  Nabumetone 500 Mg Tabs (Nabumetone) .Marland Kitchen.. 1 Tablet 2-3 Times Daily With Food 4)  Cetirizine-Pseudoephedrine 5-120 Mg Xr12h-Tab (Cetirizine-Pseudoephedrine) .Marland Kitchen.. 1 By Mouth Once Daily For Allergies/congestion 5)  Metformin Hcl 1000 Mg Tabs (Metformin Hcl) .Marland Kitchen.. 1 By Mouth Two Times A Day For Diabetes 6)  Nexium 40 Mg Cpdr (Esomeprazole Magnesium) .... Take 1 Tab Each Morning 7)  Prodigy Pocket Blood Glucose W/device Kit (Blood Glucose Monitoring Suppl) .... Disp #1 Glucometer Kit.  Please Also Dispense Lancets #100 For Kit and Glucose Test Strips For Testing Once Daily, 1 Mo Supply. 8)  Oxycodone-Acetaminophen 5-325 Mg Tabs (Oxycodone-Acetaminophen) .Marland Kitchen.. 1 By Mouth Two Times A Day As Needed Severe Pain 9)  Proair Hfa 108 (90 Base)  Mcg/act Aers (Albuterol Sulfate) .... 2 Puffs Every 4 Hours As Needed For Wheezing/asthma Flare 10)  Maxalt-Mlt 10 Mg Tbdp (Rizatriptan Benzoate) .... As Directed For Migraine 11)  Fluticasone Propionate 50 Mcg/act Susp (Fluticasone Propionate) .... 2 Sprays Each Nostril Once Daily.  Lean Forward When Using Medicine. 12)  Gabapentin 300 Mg Caps (Gabapentin) .Marland Kitchen.. 1 By Mouth At Bedtime For Shoulder Pain.  After 1 Week Increase To Two Times A Day  Allergies (verified): No Known Drug Allergies  Past History:  PMH reviewed for relevance  Review of Systems       per HPI  Physical Exam  General:  no acute distress, alert. pleasant.  very difficult to focus.  VS reviewed Head:  normocephalic,  atraumatic, and no abnormalities observed.   Neck:  No deformities, masses, or tenderness noted. negative spurlings Lungs:  Normal respiratory effort, chest expands symmetrically. Lungs are clear to auscultation, no crackles or wheezes. Heart:  Normal rate and regular rhythm. S1 and S2 normal without gallop, murmur, click, rub or other extra sounds. Abdomen:  no CVA tenderness Msk:  diminished strength on L arm secondary to effort.  she is using equally well during conversation.  tenderness over trapezious on L side of neck.  FROM of shoulder though she states it hurts.  normal DTRS of upper extremities Psych:  hard to focus.  poor eye contact.     Impression & Recommendations:  Problem # 1:  SHOULDER PAIN, LEFT (ICD-719.41) Assessment New no red flags noted on examination as noted above.  conservative therapy at this time - soft collar during day, start gabapentin, exercise handout provided so she doesn't develop frozen shoulder. refilled pain medications to use as needed.   Her updated medication list for this problem includes:    Methocarbamol 750 Mg Tabs (Methocarbamol) .Marland Kitchen... 1 tablet by mouth three times a day    Nabumetone 500 Mg Tabs (Nabumetone) .Marland Kitchen... 1 tablet 2-3 times daily with food    Oxycodone-acetaminophen 5-325 Mg Tabs (Oxycodone-acetaminophen) .Marland Kitchen... 1 by mouth two times a day as needed severe pain  Orders: Cervical collar- [facility] (L0210) [facility]- Est  Level 4 ([address])  Problem # 2:  DIABETES MELLITUS, TYPE II (ICD-250.00) Assessment: Unchanged  encouraged medication use as prescribed.   due foot check at next visit.   Her updated medication list for this problem includes:    Metformin Hcl 1000 Mg Tabs (Metformin hcl) .Marland Kitchen... 1 by mouth two times a day for diabetes  Orders: A1C-[facility] ([address]) [facility]- Est  Level 4 ([address])  Labs Reviewed: Creat: 0.80 (09/26/2008)     Last Eye Exam: No diabetic retinopathy.    (02/03/2009) Reviewed HgBA1c results: 6.1 (09/08/2009)  6.2  (05/09/2009)  Problem # 3:  DEPRESSION (ICD-311) Assessment: Deteriorated states she doesn't need medication at this point.  encouraged her to get back with [facility].  will continue to monitor and initiate treatment if necessary   The following medications were removed from the medication list:    Alprazolam 0.5 Mg Tabs (Alprazolam) .Marland Kitchen... 1 tablet by mouth as needed  Orders: [facility]- Est  Level 4 ([address])  Problem # 4:  FLANK PAIN (ICD-789.09) Assessment: Improved unclear cause at this point in time.  encouraged to return if redevelops or worsens.   Her updated medication list for this problem includes:    Methocarbamol 750 Mg Tabs (Methocarbamol) .Marland Kitchen... 1 tablet by mouth three times a day    Nabumetone 500 Mg Tabs (Nabumetone) .Marland Kitchen... 1 tablet 2-3 times daily with food  Oxycodone-acetaminophen 5-325 Mg Tabs (Oxycodone-acetaminophen) .Marland Kitchen... 1 by mouth two times a day as needed severe pain  Orders: [facility]- Est  Level 4 ([address])  Complete Medication List: 1)  Topamax 25 Mg Tabs (Topiramate) .Marland Kitchen.. 1 tablet by mouth three times a day - states she is taking it prn 2)  Methocarbamol 750 Mg Tabs (Methocarbamol) .Marland Kitchen.. 1 tablet by mouth three times a day 3)  Nabumetone 500 Mg Tabs (Nabumetone) .Marland Kitchen.. 1 tablet 2-3 times daily with food 4)  Cetirizine-pseudoephedrine 5-120 Mg Xr12h-tab (Cetirizine-pseudoephedrine) .Marland Kitchen.. 1 by mouth once daily for allergies/congestion 5)  Metformin Hcl 1000 Mg Tabs (Metformin hcl) .Marland Kitchen.. 1 by mouth two times a day for diabetes 6)  Nexium 40 Mg Cpdr (Esomeprazole magnesium) .... Take 1 tab each morning 7)  Prodigy Pocket Blood Glucose W/device Kit (Blood glucose monitoring suppl) .... Disp #1 glucometer kit.  please also dispense lancets #100 for kit and glucose test strips for testing once daily, 1 mo supply. 8)  Oxycodone-acetaminophen 5-325 Mg Tabs (Oxycodone-acetaminophen) .Marland Kitchen.. 1 by mouth two times a day as needed severe pain 9)  Proair Hfa 108 (90 Base) Mcg/act Aers  (Albuterol sulfate) .... 2 puffs every 4 hours as needed for wheezing/asthma flare 10)  Maxalt-mlt 10 Mg Tbdp (Rizatriptan benzoate) .... As directed for migraine 11)  Fluticasone Propionate 50 Mcg/act Susp (Fluticasone propionate) .... 2 sprays each nostril once daily.  lean forward when using medicine. 12)  Gabapentin 300 Mg Caps (Gabapentin) .Marland Kitchen.. 1 by mouth at bedtime for shoulder pain.  after 1 week increase to two times a day  Patient Instructions: 1)  Please follow up in 1 month for your shoulder and depression.  These both need to be watched closely.  2)  Always take your diabetes medications as prescribed.  Today your A1C was 6.1 3)  For your shoulder: start taking the neurontin - this will help the pain.  Also wear the soft collar for the next week just during the day and do the exercises on the handout for the shoulder.  This will help.  Prescriptions: OXYCODONE-ACETAMINOPHEN 5-325 MG TABS (OXYCODONE-ACETAMINOPHEN) 1 by mouth two times a day as needed severe pain  #45 x 0   Entered and Authorized by:   Ancil Boozer  MD   Signed by:   Ancil Boozer  MD on 09/08/2009   Method used:   Print then Give to Patient   RxID:   [id number] GABAPENTIN 300 MG CAPS (GABAPENTIN) 1 by mouth at bedtime for shoulder pain.  After 1 week increase to two times a day  #60 x 1   Entered and Authorized by:   Ancil Boozer  MD   Signed by:   Ancil Boozer  MD on 09/08/2009   Method used:   [facility]        [facility] [address] [phone]* (retail)       [address]       [place], [place]  [address]       Ph: [phone] or [phone]       Fax: [phone]   RxID:   [id number] NEXIUM 40 MG CPDR (ESOMEPRAZOLE MAGNESIUM) Take 1 tab each morning  #30 x 11   Entered and Authorized by:   Ancil Boozer  MD   Signed by:   Ancil Boozer  MD on 09/08/2009   Method used:   [facility]        [facility] [address] [phone]* (  retail)       [address]        [place], [place]  [address]       Ph: [phone] or [phone]       Fax: [phone]   RxID:   [id number] CETIRIZINE-PSEUDOEPHEDRINE 5-120 MG XR12H-TAB (CETIRIZINE-PSEUDOEPHEDRINE) 1 by mouth once daily for allergies/congestion  #30 x 11   Entered and Authorized by:   Ancil Boozer  MD   Signed by:   Ancil Boozer  MD on 09/08/2009   Method used:   [facility] to        [facility] [address] [phone]* (retail)       [address]       [place], [place]  [address]       Ph: [phone] or [phone]       Fax: [phone]   RxID:   [id number]   Laboratory Results   Blood Tests   Date/Time Received: September 08, 2009 3:40 PM  Date/Time Reported: September 08, 2009 4:00 PM   HGBA1C: 6.1%   (Normal Range: Non-Diabetic - 3-6%   Control Diabetic - 6-8%)  Comments: ...............test performed by.................Marland KitchenGaren Grams, LPN .............entered by...........Marland KitchenBonnie A. [place], MLS (ASCP)cm

## 2010-07-24 NOTE — Progress Notes (Signed)
Summary: triage  Phone Note Call from Patient Call back at Home Phone [phone]   Caller: Patient Summary of Call: Pt had shoulder injection today and now states the left side of her back is numb.  Injection given in right shoulder. Initial call taken by: Clydell Hakim,  January 18, 2010 3:05 PM  Follow-up for Phone Call        back left side from waist to bra line became numb. it lasted 30 minutes. hx back pain. wants to know if this is related to the injection she got in her shoulder. told her I will check with md & call her back Follow-up by: Golden Circle RN,  January 18, 2010 3:24 PM  Additional Follow-up for Phone Call Additional follow up Details #1::        spoke with Dr. Mauricio Po. not likely that it was related to injection. called & relayed this to pt. she was ok with the answer Additional Follow-up by: Golden Circle RN,  January 18, 2010 3:29 PM     Appended Document: Orders Update    Clinical Lists Changes  Orders: Added new Test order of [facility]- Est  Level 4 (09811) - Signed Added new Test order of Injection, intermediate joint - [facility] (91478) - Signed

## 2010-07-24 NOTE — Miscellaneous (Signed)
Summary: patient summary  Clinical Lists Changes  Medications: Removed medication of AMOXICILLIN 500 MG CAPS (AMOXICILLIN) Take 1 tab every 8 hours for 7 days Observations: Added new observation of PAST MED HX: Depression and mental illness NOS- has been seen at mental health but doesn't like to go to them so often seeks help at [facility].  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  has pain contract h/o stomach ulcer h/o abnormal thyroid test MRI lumbar spine 01/25/08 -- no spinal stenosis or nerve root encroachment, bilateral facet hypertrophy @ l4-5 and s1, low t1 marrow signal nonspecific and may represent a normal variant or manifestation of smoking or med use  compliance issues.  (11/30/2009 14:34)     Past, Family, and Social History Past History: Depression and mental illness NOS- has been seen at mental health but doesn't like to go to them so often seeks help at [facility].  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  has pain contract h/o stomach ulcer h/o abnormal thyroid test MRI lumbar spine 01/25/08 -- no spinal stenosis or nerve root encroachment, bilateral facet hypertrophy @ l4-5 and s1, low t1 marrow signal nonspecific and may represent a normal variant or manifestation of smoking or med use  compliance issues.

## 2010-07-24 NOTE — Progress Notes (Signed)
Summary: phn msg  Phone Note Call from Patient Call back at [facility] Phone [phone]   Caller: Patient Summary of Call: waking up lately w/ low BS, thinks she is taking too much.  120 is low for her.  yesterday it was 76 after she ate a bowl of cereal  Initial call taken by: De Nurse,  October 21, 2008 1:41 PM  Follow-up for Phone Call        Pt states for the last week she has been waking up in the middle of the night with low blood sugar- sweating, nausea, dizziness.  States she also has burning in her vagina with these symptoms as well.  States her blood sugar has been running in the low 70s.  States she is eating a snack before bed and at 2am.  Spoke with Dr. Sandi Mealy - she advised that pt cut glyburide in half this weekend, and use OTC yeast infection cream.  Scheduled pt for a [place] monday morning to discuss with Dr. Sandi Mealy.  Gave her the above message about taking half of her glyburide and using OTC yeast cream, advised her to monitor her blood sugars fasting, and if she has s/s of low blood sugar and keep a log for appt Monday. Follow-up by: Jacki Cones RN,  October 21, 2008 3:28 PM

## 2010-07-24 NOTE — Letter (Signed)
Summary: Lipid Letter  [facility]  [address]   [place], [place] [address]   Phone: [phone]  Fax: [phone]    12/08/2009  Kendra Hall [address] [place], [place]  [address]  Dear [name]:  We have carefully reviewed your last lipid profile from 12/07/2009 and the results are noted below with a summary of recommendations for lipid management.    Cholesterol:       129     Goal: < 200   HDL "good" Cholesterol:   45     Goal: > 40   LDL "bad" Cholesterol:   62     Goal: < 70    Triglycerides:       110     Goal: < 150    These numbers are excellent.  Your kidney and liver function were also excellent and your test for gout was normal.      TLC Diet (Therapeutic Lifestyle Change): Saturated Fats & Transfatty acids should be kept < 7% of total calories ***Reduce Saturated Fats Polyunstaurated Fat can be up to 10% of total calories Monounsaturated Fat Fat can be up to 20% of total calories Total Fat should be no greater than 25-35% of total calories Carbohydrates should be 50-60% of total calories Protein should be approximately 15% of total calories Fiber should be at least 20-30 grams a day ***Increased fiber may help lower LDL Total Cholesterol should be < 200mg /day Consider adding plant stanol/sterols to diet (example: Benacol spread) ***A higher intake of unsaturated fat may reduce Triglycerides and Increase HDL    Adjunctive Measures (may lower LIPIDS and reduce risk of Heart Attack) include: Aerobic Exercise (20-30 minutes 3-4 times a week) Limit Alcohol Consumption Weight Reduction Aspirin 75-81 mg a day by mouth (if not allergic or contraindicated) Dietary Fiber 20-30 grams a day by mouth     Current Medications: 1)    Topamax 25 Mg Tabs (Topiramate) .Marland Kitchen.. 1 tablet by mouth three times a day for headache prevention 2)    Methocarbamol 750 Mg Tabs (Methocarbamol) .Marland Kitchen.. 1 tablet by mouth three times a day 3)    Nabumetone 500 Mg  Tabs (Nabumetone) .Marland Kitchen.. 1 tablet 2-3 times daily with food 4)    Cetirizine-pseudoephedrine 5-120 Mg Xr12h-tab (Cetirizine-pseudoephedrine) .Marland Kitchen.. 1 by mouth once daily for allergies/congestion 5)    Metformin Hcl 500 Mg Tabs (Metformin hcl) .... 2 by mouth two times a day for diabetes control. 6)    Nexium 40 Mg Cpdr (Esomeprazole magnesium) .... Take 1 tab each morning 7)    Prodigy Pocket Blood Glucose W/device Kit (Blood glucose monitoring suppl) .... Disp #1 glucometer kit.  please also dispense lancets #100 for kit and glucose test strips for testing once daily, 1 mo supply. 8)    Oxycodone-acetaminophen 5-325 Mg Tabs (Oxycodone-acetaminophen) .Marland Kitchen.. 1 by mouth two times a day as needed severe pain 9)    Proair Hfa 108 (90 Base) Mcg/act Aers (Albuterol sulfate) .... 2 puffs every 4 hours as needed for wheezing/asthma flare 10)    Maxalt-mlt 10 Mg Tbdp (Rizatriptan benzoate) .... As directed for migraine 11)    Fluticasone Propionate 50 Mcg/act Susp (Fluticasone propionate) .... 2 sprays each nostril once daily.  lean forward when using medicine. 12)    Gabapentin 300 Mg Caps (Gabapentin) .Marland Kitchen.. 1 by mouth two times a day for pain 13)    Promethazine Hcl 25 Mg Tabs (Promethazine hcl) .... 1/2 - 1 by mouth three times a day  as needed nausea 14)    Lisinopril 5 Mg Tabs (Lisinopril) .Marland Kitchen.. 1 tablet daily for kidney protection 15)    Alprazolam 0.5 Mg Tabs (Alprazolam) .Marland Kitchen.. 1 by mouth three times a day as needed severe anxiety.  may refill every 30 days  If you have any questions, please call. We appreciate being able to work with you.   Sincerely,    [facility] Bobby Rumpf  MD  Appended Document: Lipid Letter MAILED.

## 2010-07-24 NOTE — Miscellaneous (Signed)
Summary: dm med  Clinical Lists Changes Ms. Kuhnert is concerned about her rx for glyburide.  She is about to run out and wanted to know if she should renew it or discontinue in lieu of her metformin.  She also need a glucometer.  She hasn't been testing her blood sugar.  Please call her at [phone] Abundio Miu  October 10, 2008 3:53 PM  Medications: Added new medication of PRODIGY POCKET BLOOD GLUCOSE W/DEVICE KIT (BLOOD GLUCOSE MONITORING SUPPL) Disp #1 glucometer kit.  Please also dispense lancets #100 for kit and glucose test strips for testing once daily, 1 mo supply. - Signed Rx of PRODIGY POCKET BLOOD GLUCOSE W/DEVICE KIT (BLOOD GLUCOSE MONITORING SUPPL) Disp #1 glucometer kit.  Please also dispense lancets #100 for kit and glucose test strips for testing once daily, 1 mo supply.;  #1 x 0;  Signed;  Entered by: Ancil Boozer  MD;  Authorized by: Ancil Boozer  MD;  Method used: [facility] [address] [phone]*, [address], [place], [place], [place]  [address], Ph: [phone] or [phone], Fax: [phone]    Prescriptions: PRODIGY POCKET BLOOD GLUCOSE W/DEVICE KIT (BLOOD GLUCOSE MONITORING SUPPL) Disp #1 glucometer kit.  Please also dispense lancets #100 for kit and glucose test strips for testing once daily, 1 mo supply.  #1 x 0   Entered and Authorized by:   Ancil Boozer  MD   Signed by:   Ancil Boozer  MD on 10/11/2008   Method used:   [facility]        [facility] [address] [phone]* (retail)       [address]       [place], [place]  [address]       Ph: [phone] or [phone]       Fax: [phone]   RxID:   [id number]    She should continue to take her glyburide.  She also does not need to check her blood sugars as of yet.  We can discuss this at our next visit.  Thanks, Ancil Boozer  MD  October 11, 2008 3:52 PM   Pt is very concerned because both of her parents had DM.  Questioning why she does not  need glucometer yet, and what she does when she feels lightheaded.  MD please advise..................................... Shanda Bumps Fleeger CMA  October 11, 2008 4:14 PM   If she so desires it is fine to go ahead and prescribe glucometer.  she should check her sugars once a day and when she feels lightheaded.  Her goal numbers are between 80-120 fasting and less than 200 postmeals.  I will send rx for glucometer to pharmacy. call if concerns.  if she is having trouble she should schedule another appt to be seen.  Ancil Boozer  MD  October 11, 2008 4:30 PM  LMOVM for pt to call back......................................... Shanda Bumps Fleeger CMA  October 12, 2008 10:21 AM  Will await callback........................................... Shanda Bumps Fleeger CMA  October 14, 2008 10:35 AM

## 2010-07-24 NOTE — Miscellaneous (Signed)
Summary: walk in  Clinical Lists Changes wants to see a dentist tonight. states Jaynee Eagles told her adult dental has a night clinic. I filled out the form & faxed it. I also called & left a message for angie, who schedules appts. I had not hear back by 4:30 so I called Angie. they do not have a dentist tonight. I told her I faxed the form and asked that she call me or th ept asap.Golden Circle RN  Nov 15, 2009 4:33 PM

## 2010-07-24 NOTE — Assessment & Plan Note (Signed)
Summary: f/up,tcb   Vital Signs:  Patient profile:   45 year old female Height:      63.5 inches Weight:      186 pounds BMI:     32.55 BSA:     1.89 Temp:     98.7 degrees F Pulse rate:   79 / minute BP sitting:   110 / 54  Vitals Entered By: Jone Baseman CMA (March 14, 2009 11:34 AM) CC: arthritis pain Is Patient Diabetic? No Pain Assessment Patient in pain? yes     Location: back Intensity: 7   Primary Care Provider:  Ancil Boozer  MD  CC:  arthritis pain.  History of Present Illness: pain: back pain has flared for past 1.5 weeks.  is her typical pain flare.  worst with extension of her back.  none running down legs. no loss of bowel or bladder continence.  pain meds help but she is out.      Habits & Providers  Alcohol-Tobacco-Diet     Tobacco Status: current     Tobacco Counseling: to quit use of tobacco products     Cigarette Packs/Day: 0.25  Current Medications (verified): 1)  Topamax 25 Mg Tabs (Topiramate) .Marland Kitchen.. 1 Tablet By Mouth Three Times A Day 2)  Methocarbamol 750 Mg Tabs (Methocarbamol) .Marland Kitchen.. 1 Tablet By Mouth Three Times A Day 3)  Nabumetone 500 Mg Tabs (Nabumetone) .Marland Kitchen.. 1 Tablet 2-3 Times Daily With Food 4)  Fexofenadine Hcl 180 Mg Tabs (Fexofenadine Hcl) .Marland Kitchen.. 1 Tablet By Mouth Once Daily 5)  Promethazine Hcl 25 Mg Tabs (Promethazine Hcl) .Marland Kitchen.. 1 Tablet By Mouth As Needed 6)  Alprazolam 0.5 Mg Tabs (Alprazolam) .Marland Kitchen.. 1 Tablet By Mouth As Needed 7)  Metformin Hcl 500 Mg Tabs (Metformin Hcl) .... Titrate Up As Directed To Goal 2 At [facility] and 2 At Midnight For Diabetes 8)  Nexium 40 Mg Cpdr (Esomeprazole Magnesium) .... Take 1 Tab Each Morning 9)  Prodigy Pocket Blood Glucose W/device Kit (Blood Glucose Monitoring Suppl) .... Disp #1 Glucometer Kit.  Please Also Dispense Lancets #100 For Kit and Glucose Test Strips For Testing Once Daily, 1 Mo Supply. 10)  Oxycodone-Acetaminophen 5-325 Mg Tabs (Oxycodone-Acetaminophen) .Marland Kitchen.. 1 By Mouth Two Times A  Day As Needed Severe Pain 11)  Proair Hfa 108 (90 Base) Mcg/act Aers (Albuterol Sulfate) .... 2 Puffs Every 4 Hours As Needed For Wheezing/asthma Flare 12)  Promethazine Hcl 25 Mg Tabs (Promethazine Hcl) .... 1/2 - 1 By Mouth Q8hr As Needed Nausea.  Allergies (verified): No Known Drug Allergies  Past History:  Past medical, surgical, family and social histories (including risk factors) reviewed for relevance to current acute and chronic problems.  Past Medical History: Reviewed history from 09/26/2008 and no changes required. Depression Diabetes mellitus, type II Migraines Arthritis in spine with history of facet joint injections (dr hilts) stomach ulcer abnormal thyroid test MRI lumbar spine 01/25/08 -- no spinal stenosis or nerve root encroachment, bilateral facet hypertrophy @ l4-5 and s1, low t1 marrow signal nonspecific and may represent a normal variant or manifestation of smoking or med use  Past Surgical History: Reviewed history from 09/22/2008 and no changes required. Tubal ligation-1995 Axillary sweat glands and lymph nodes removed bilaterally 2001  Family History: Reviewed history from 09/26/2008 and no changes required. Rectal Cancer - mother Family History Breast cancer 1st degree relative <50- sister age 39 Family History of CAD Female 1st degree relative <50- father MI x5  Family History Diabetes 1st degree relative- parents and  sister family history of hypertenion  Social History: Reviewed history from 09/26/2008 and no changes required. Pt lives with her daughter and son ages 62 and 77, and grandson age 40.  She is currently unemployed.  Enjoys reading.  [place]iginally from [place].  moved here to [place] in 2007.  disabled 2/2 depression/anxietyPacks/Day:  0.25  Review of Systems       per HPI  Physical Exam  General:  Well-developed,well-nourished,in no acute distress; alert,appropriate and cooperative throughout examination Msk:  pain with flexion and  extension of back.  pain to palpation over bilateral paraspinous muscles R>L.  negative SLR bilaterally both seated and lying. normal gait.   Impression & Recommendations:  Problem # 1:  LOW BACK PAIN, CHRONIC (ICD-724.2) Assessment Deteriorated  refilled meds.  no red flags.  monitor closely.  Her updated medication list for this problem includes:    Methocarbamol 750 Mg Tabs (Methocarbamol) .Marland Kitchen... 1 tablet by mouth three times a day    Nabumetone 500 Mg Tabs (Nabumetone) .Marland Kitchen... 1 tablet 2-3 times daily with food    Oxycodone-acetaminophen 5-325 Mg Tabs (Oxycodone-acetaminophen) .Marland Kitchen... 1 by mouth two times a day as needed severe pain  Orders: [facility]- Est Level  3 (84696)  Complete Medication List: 1)  Topamax 25 Mg Tabs (Topiramate) .Marland Kitchen.. 1 tablet by mouth three times a day 2)  Methocarbamol 750 Mg Tabs (Methocarbamol) .Marland Kitchen.. 1 tablet by mouth three times a day 3)  Nabumetone 500 Mg Tabs (Nabumetone) .Marland Kitchen.. 1 tablet 2-3 times daily with food 4)  Fexofenadine Hcl 180 Mg Tabs (Fexofenadine hcl) .Marland Kitchen.. 1 tablet by mouth once daily 5)  Promethazine Hcl 25 Mg Tabs (Promethazine hcl) .Marland Kitchen.. 1 tablet by mouth as needed 6)  Alprazolam 0.5 Mg Tabs (Alprazolam) .Marland Kitchen.. 1 tablet by mouth as needed 7)  Metformin Hcl 500 Mg Tabs (Metformin hcl) .... Titrate up as directed to goal 2 at noon and 2 at midnight for diabetes 8)  Nexium 40 Mg Cpdr (Esomeprazole magnesium) .... Take 1 tab each morning 9)  Prodigy Pocket Blood Glucose W/device Kit (Blood glucose monitoring suppl) .... Disp #1 glucometer kit.  please also dispense lancets #100 for kit and glucose test strips for testing once daily, 1 mo supply. 10)  Oxycodone-acetaminophen 5-325 Mg Tabs (Oxycodone-acetaminophen) .Marland Kitchen.. 1 by mouth two times a day as needed severe pain 11)  Proair Hfa 108 (90 Base) Mcg/act Aers (Albuterol sulfate) .... 2 puffs every 4 hours as needed for wheezing/asthma flare 12)  Promethazine Hcl 25 Mg Tabs (Promethazine hcl) .... 1/2 - 1 by  mouth q8hr as needed nausea.  Patient Instructions: 1)  Your pain is consistent with your arthritis.  We will work on getting through this flare with m edication and stretches. 2)  You have lost 3 more pounds!  Congrats! 3)  Let me know if you can't hold your urine or stool or if you start having shooting pains down your legs. Prescriptions: METHOCARBAMOL 750 MG TABS (METHOCARBAMOL) 1 tablet by mouth three times a day  #90 x 1   Entered and Authorized by:   Ancil Boozer  MD   Signed by:   Ancil Boozer  MD on 03/14/2009   Method used:   Print then Give to Patient   RxID:   [id number] OXYCODONE-ACETAMINOPHEN 5-325 MG TABS (OXYCODONE-ACETAMINOPHEN) 1 by mouth two times a day as needed severe pain  #60 x 0   Entered and Authorized by:   Ancil Boozer  MD   Signed by:   Judeth Cornfield  Alm  MD on 03/14/2009   Method used:   Print then Give to Patient   RxID:   [id number]

## 2010-07-24 NOTE — Progress Notes (Signed)
Summary: triage  Phone Note Call from Patient Call back at Home Phone [phone]   Caller: Patient Summary of Call: having a migrain Initial call taken by: De Nurse,  March 24, 2009 4:25 PM  Follow-up for Phone Call        took topamax & maxalt. went to ED wed night.  rec'd IV with med in it. it has not worked. states it has been getting worse. took her last elavil last night & slept well. This is an old rx she had. urged her to make appt with pcp & get a plan for future migraine treatment. sent  her to UC as we are closing in a few minutes. she agreed Follow-up by: Golden Circle RN,  March 24, 2009 4:30 PM

## 2010-07-24 NOTE — Progress Notes (Signed)
Summary: Rx Prob  Phone Note Call from Patient Call back at Home Phone [phone]   Caller: Patient Summary of Call: Pt states that the rx was sent to [facility] yesterday did not get to pharmacy can it be resent? Initial call taken by: Clydell Hakim,  December 28, 2009 11:42 AM  Follow-up for Phone Call        called pharmacy and was told they do have RX but it is requiring prior authorization because patient received methocarbamol ( Generic Robaxin) [facility] . will forward this message to MD. to please advise.  Follow-up by: Theresia Lo RN,  December 28, 2009 12:16 PM  Additional Follow-up for Phone Call Additional follow up Details #1::        Kennon Rounds, # to call is [phone] for PA if needed. Additional Follow-up by: Theresia Lo RN,  December 28, 2009 12:30 PM    Additional Follow-up for Phone Call Additional follow up Details #2::    to Dr. Hulen Luster who ordered the skelaxin. she is already on methocarbaminol. do you want her to have skelaxin as well? if so, skelaxin is not on the preferred drug plan. please advise what I should tell the pharmacy  Follow-up by: Golden Circle RN,  December 28, 2009 2:53 PM  Additional Follow-up for Phone Call Additional follow up Details #3:: Details for Additional Follow-up Action Taken: spoke with preceptor. she cannot be on both metocarbaninol & skelaxin. I called the pharmacy to d/c the skelaxin med (it is non-preferred & would have needed a Prior Auth & a trial & failure of formulary meds first.  Additional Follow-up by: Golden Circle RN,  December 28, 2009 2:59 PM   Appended Document: Rx Prob Pt calling back, says the medication methocarbaminol is not working, wants to know what the next step is to be approved for skelaxin? [phone] ERIN ODELL 01/02/10 4:08 PM

## 2010-07-24 NOTE — Assessment & Plan Note (Signed)
Summary: vag inf?,df   Vital Signs:  Patient profile:   45 year old female Weight:      210.9 pounds Temp:     98 degrees F oral Pulse rate:   80 / minute BP sitting:   119 / 79  (left arm)  Vitals Entered By: Arlyss Repress CMA, (October 10, 2008 3:19 PM) CC: vaginal itching, tooth pain Is Patient Diabetic? Yes   History of Present Illness: 45yr old presents to discuss:  1) Vag itching x 2 weeks - On the outside and started after shaving. Helped somewhat by desitin.  Denies vaginal discharge or any sexual activity. Doesn't want testing for STDs. No fever. LMP just went off yesterday.  2) Tooth pain on R lower side - Poor dentition and noticed tooth pain x 2-3 days. No fevers. No purulence frankly in mouth by tooth is sore and radiates into jaw. Does smoke.   Habits & Providers     Tobacco Status: never  Current Medications (verified): 1)  Glyburide 5 Mg Tabs (Glyburide) .Marland Kitchen.. 1 Tablet By Mouth Once Daily For Diabetes 2)  Topamax 25 Mg Tabs (Topiramate) .Marland Kitchen.. 1 Tablet By Mouth Three Times A Day 3)  Methocarbamol 750 Mg Tabs (Methocarbamol) .Marland Kitchen.. 1 Tablet By Mouth Three Times A Day 4)  Nabumetone 500 Mg Tabs (Nabumetone) .Marland Kitchen.. 1 Tablet 2-3 Times Daily With Food 5)  Fexofenadine Hcl 180 Mg Tabs (Fexofenadine Hcl) .Marland Kitchen.. 1 Tablet By Mouth Once Daily 6)  Promethazine Hcl 25 Mg Tabs (Promethazine Hcl) .Marland Kitchen.. 1 Tablet By Mouth As Needed 7)  Alprazolam 0.5 Mg Tabs (Alprazolam) .Marland Kitchen.. 1 Tablet By Mouth As Needed 8)  Metformin Hcl 500 Mg Tabs (Metformin Hcl) .... Titrate Up As Directed To Goal 2 At [facility] and 2 At Midnight For Diabetes 9)  Nexium 40 Mg Cpdr (Esomeprazole Magnesium) .... Take 1 Tab Each Morning 10)  Nystatin-Triamcinolone 100000-0.1 Unit/gm-% Crea (Nystatin-Triamcinolone) .... Apply To Itchy Areas Two Times A Day Until Improved. Dispense One Tube. 11)  Penicillin V Potassium 500 Mg Tabs (Penicillin V Potassium) .... One By Mouth Three Times A Day X 10 Days  Allergies (verified): No  Known Drug Allergies  Past History:  Past medical, surgical, family and social histories (including risk factors) reviewed, and no changes noted (except as noted below).  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)    stomach ulcer    abnormal thyroid test    MRI lumbar spine 01/25/08 -- no spinal stenosis or nerve root encroachment, bilateral facet hypertrophy @ l4-5 and s1, low t1 marrow signal nonspecific and may represent a normal variant or manifestation of smoking or med use  Past Surgical History:    Reviewed history from 09/22/2008 and no changes required:    Tubal ligation-1995    Axillary sweat glands and lymph nodes removed bilaterally 2001  Family History:    Reviewed history from 09/26/2008 and no changes required:       Rectal Cancer - mother       Family History Breast cancer 1st degree relative <50- sister age 85       Family History of CAD Female 1st degree relative <50- father MI x5        Family History Diabetes 1st degree relative- parents and sister  family history of hypertenion  Social History:    Reviewed history from 09/26/2008 and no changes required:       Pt lives with her daughter and son ages 19 and 15, and grandson age 35.  She is currently unemployed.  Enjoys reading.  [place]iginally from [place].  moved here to [place] in 2007.  disabled 2/2 depression/anxiety    Smoking Status:  never  Physical Exam  General:  obese, cooperative, NAD Head:  normocephalic and atraumatic.   Mouth:  poor dentition, broken molar on R lower along with  missing tooth.  Smells fetid but no obvious abscess or purulence.   Neck:  no significant LAD Skin:  hyperpigmented irritated and mildly erythematous rash in creases of groin with few sattelite lesions. no superimposed infection apparent.     Impression & Recommendations:  Problem # 1:  INTERTRIGO, CANDIDAL (ICD-695.89) Assessment New  Nystatin cream two times a day until healed. Okay to use desitin. Advised to keep dry. Needs FU with PCP for DM check.   Orders: [facility]- Est  Level 4 ([address]109)  Problem # 2:  BROKEN TOOTH, INFECTED (ICD-873.73) Assessment: New  PCN x 10 days. Advised this only busy time. Needs to see dentist.   Orders: [facility]- Est  Level 4 (60454)  Complete Medication List: 1)  Glyburide 5 Mg Tabs (Glyburide) .Marland Kitchen.. 1 tablet by mouth once daily for diabetes 2)  Topamax 25 Mg Tabs (Topiramate) .Marland Kitchen.. 1 tablet by mouth three times a day 3)  Methocarbamol 750 Mg Tabs (Methocarbamol) .Marland Kitchen.. 1 tablet by mouth three times a day 4)  Nabumetone 500 Mg Tabs (Nabumetone) .Marland Kitchen.. 1 tablet 2-3 times daily with food 5)  Fexofenadine Hcl 180 Mg Tabs (Fexofenadine hcl) .Marland Kitchen.. 1 tablet by mouth once daily 6)  Promethazine Hcl 25 Mg Tabs (Promethazine hcl) .Marland Kitchen.. 1 tablet by mouth as needed 7)  Alprazolam 0.5 Mg Tabs (Alprazolam) .Marland Kitchen.. 1 tablet by mouth as needed 8)  Metformin Hcl 500 Mg Tabs (Metformin hcl) .... Titrate up as directed to goal 2 at noon and 2 at midnight for diabetes 9)  Nexium 40 Mg Cpdr (Esomeprazole magnesium) .... Take 1 tab each morning 10)  Nystatin-triamcinolone 100000-0.1 Unit/gm-% Crea (Nystatin-triamcinolone) .... Apply to itchy areas two times a day until improved. dispense one tube. 11)  Penicillin V Potassium 500 Mg Tabs (Penicillin v potassium) .... One by mouth three times a day x 10 days  [facility]  Patient Instructions: 1)  For your itching, using the cream two times a day until it is cleared. It only takes a little bit so use it sparingly.  Okay to use Desitin on top of it. 2)  For your tooth, Penicillin 500mg  three times a day for 10 days. Follow up with a dentist to have that tooth looked at. The antibiotic will only buy you some time. That tooth will need to be pulled out,  most likely. 3)  Please continue to consider quitting smoking. It is by far the very best thing you can do to increase your life expectancy.  Smoking increases your chance of having a heart attack and stroke by 30%!!  Prescriptions: PENICILLIN V POTASSIUM 500 MG TABS (PENICILLIN V POTASSIUM) one by mouth three times a day x 10 days  #30 x 0   Entered and Authorized by:   Lupita Raider MD   Signed by:   Lupita Raider MD on 10/10/2008   Method used:   Electronically to  [facility] [address] [phone]* (retail)       [address]       [place], [place]  [address]       Ph: [phone] or [phone]       Fax: [phone]   RxID:   [id number] NYSTATIN-TRIAMCINOLONE 100000-0.1 UNIT/GM-% CREA (NYSTATIN-TRIAMCINOLONE) apply to itchy areas two times a day until improved. dispense one tube.  #1 x 1   Entered and Authorized by:   Lupita Raider MD   Signed by:   Lupita Raider MD on 10/10/2008   Method used:   [facility] to        [facility] [address] [phone]* (retail)       [address]       [place], [place]  [address]       Ph: [phone] or [phone]       Fax: [phone]   RxID:   [id number]

## 2010-07-24 NOTE — Letter (Signed)
Summary: Lipid Letter  [facility]  [address]   [place], [place] [address]   Phone: [phone]  Fax: [phone]    09/28/2008  Kendra Hall [address] [place], [place]  [address]  Dear Kendra Hall:  We have carefully reviewed your last lipid profile from 09/26/2008 and the results are noted below with a summary of recommendations for lipid management.    Cholesterol:       135     Goal: < 160   HDL "good" Cholesterol:   41     Goal: > 40   LDL "bad" Cholesterol:   62     Goal: < 70    Triglycerides:       160     Goal: < 150    To help your triglycerides watch your diet and start taking fish oils daily.  we will continue to watch your cholesterol and diabetes closely.  Your thyroid, kidney and liver function is all normal as well.    TLC Diet (Therapeutic Lifestyle Change): Saturated Fats & Transfatty acids should be kept < 7% of total calories ***Reduce Saturated Fats Polyunstaurated Fat can be up to 10% of total calories Monounsaturated Fat Fat can be up to 20% of total calories Total Fat should be no greater than 25-35% of total calories Carbohydrates should be 50-60% of total calories Protein should be approximately 15% of total calories Fiber should be at least 20-30 grams a day ***Increased fiber may help lower LDL Total Cholesterol should be < 200mg /day Consider adding plant stanol/sterols to diet (example: Benacol spread) ***A higher intake of unsaturated fat may reduce Triglycerides and Increase HDL    Adjunctive Measures (may lower LIPIDS and reduce risk of Heart Attack) include: Aerobic Exercise (20-30 minutes 3-4 times a week) Limit Alcohol Consumption Weight Reduction Aspirin 75-81 mg a day by mouth (if not allergic or contraindicated) Dietary Fiber 20-30 grams a day by mouth     Current Medications: 1)    Glyburide 5 Mg Tabs (Glyburide) .Marland Kitchen.. 1 tablet by mouth once daily for diabetes 2)    Doxycycline Monohydrate 100 Mg Tabs  (Doxycycline monohydrate) .Marland Kitchen.. 1 tablet by mouth two times a day x 14 days 3)    Keflex 500 Mg Caps (Cephalexin) .Marland Kitchen.. 1 tablet by mouth four times daily x 7 days 4)    Topamax 25 Mg Tabs (Topiramate) .Marland Kitchen.. 1 tablet by mouth three times a day 5)    Methocarbamol 750 Mg Tabs (Methocarbamol) .Marland Kitchen.. 1 tablet by mouth three times a day 6)    Nabumetone 500 Mg Tabs (Nabumetone) .Marland Kitchen.. 1 tablet 2-3 times daily with food 7)    Fexofenadine Hcl 180 Mg Tabs (Fexofenadine hcl) .Marland Kitchen.. 1 tablet by mouth once daily 8)    Promethazine Hcl 25 Mg Tabs (Promethazine hcl) .Marland Kitchen.. 1 tablet by mouth as needed 9)    Alprazolam 0.5 Mg Tabs (Alprazolam) .Marland Kitchen.. 1 tablet by mouth as needed 10)    Metformin Hcl 500 Mg Tabs (Metformin hcl) .... Titrate up as directed to goal 2 at noon and 2 at midnight for diabetes  If you have any questions, please call. We appreciate being able to work with you.   Sincerely,    [facility] Ancil Boozer  MD  Appended Document: Lipid Letter Mailed

## 2010-07-24 NOTE — Assessment & Plan Note (Signed)
Summary: blurry vision   Vital Signs:  Patient profile:   45 year old female Weight:      189 pounds Temp:     98.4 degrees F oral Pulse rate:   83 / minute BP sitting:   110 / 72  (left arm)  Vitals Entered By: Alphia Kava (March 06, 2009 4:18 PM) CC: blurred vision Is Patient Diabetic? Yes   Vision Screening:Left eye w/o correction: 20 / 60 Right Eye w/o correction: 20 / 60 Both eyes w/o correction:  20/ 40       Vision Comments: pt was 20/50 in left eye              20/50 in right eye    Vision Entered By: Terese Door (March 06, 2009 5:02 PM)   Primary Care Provider:  Ancil Boozer  MD  CC:  blurred vision.  History of Present Illness: Pt in with complaint of blurry vision off and on x 2 days that resolved yesterday.  She describes the blurry vision as an inability to focus her eyes on objects.  She also reports some eye drainage or "cold" in her eyes during the past few days.  She reports that she had a throbbing headache that was off and on  during the past few days that has now resolved.  She said that she wanted to come into today, even though her symptoms were resolved, to find out what caused these symtoms during the past 2 days.  She states that the blurriness effected both eyes.  Pt did not have any loss of vision.  No fever. No temporal pain.  No eye pain.  No eye redness.  No ataxia.  Pt was unable to check BG levels during the episode b/c her machine was not operating correctly but this am her bg level was 186.    Habits & Providers  Alcohol-Tobacco-Diet     Tobacco Status: current     Cigarette Packs/Day: 0.5  Current Problems (verified): 1)  Constipation  (ICD-564.00) 2)  Low Back Pain, Chronic  (ICD-724.2) 3)  Dental Caries  (ICD-521.00) 4)  Rom  (ICD-382.9) 5)  Broken Tooth, Infected  (ICD-873.73) 6)  Intertrigo, Candidal  (ICD-695.89) 7)  Vaginal Pruritus  (ICD-698.1) 8)  Obesity, Unspecified  (ICD-278.00) 9)  Hx of Abnormal Thyroid  Function Tests  (ICD-794.5) 10)  Diabetes Mellitus, Type II  (ICD-250.00) 11)  Family History Diabetes 1st Degree Relative  (ICD-V18.0) 12)  Depression  (ICD-311) 13)  Family History of Cad Female 1st Degree Relative <50  (ICD-V17.3) 14)  Family History Breast Cancer 1st Degree Relative <50  (ICD-V16.3)  Current Medications (verified): 1)  Topamax 25 Mg Tabs (Topiramate) .Marland Kitchen.. 1 Tablet By Mouth Three Times A Day 2)  Methocarbamol 750 Mg Tabs (Methocarbamol) .Marland Kitchen.. 1 Tablet By Mouth Three Times A Day 3)  Nabumetone 500 Mg Tabs (Nabumetone) .Marland Kitchen.. 1 Tablet 2-3 Times Daily With Food 4)  Fexofenadine Hcl 180 Mg Tabs (Fexofenadine Hcl) .Marland Kitchen.. 1 Tablet By Mouth Once Daily 5)  Promethazine Hcl 25 Mg Tabs (Promethazine Hcl) .Marland Kitchen.. 1 Tablet By Mouth As Needed 6)  Alprazolam 0.5 Mg Tabs (Alprazolam) .Marland Kitchen.. 1 Tablet By Mouth As Needed 7)  Metformin Hcl 500 Mg Tabs (Metformin Hcl) .... Titrate Up As Directed To Goal 2 At [facility] and 2 At Midnight For Diabetes 8)  Nexium 40 Mg Cpdr (Esomeprazole Magnesium) .... Take 1 Tab Each Morning 9)  Prodigy Pocket Blood Glucose W/device Kit (Blood Glucose Monitoring Suppl) .Marland KitchenMarland KitchenMarland Kitchen  Disp #1 Glucometer Kit.  Please Also Dispense Lancets #100 For Kit and Glucose Test Strips For Testing Once Daily, 1 Mo Supply. 10)  Oxycodone-Acetaminophen 5-325 Mg Tabs (Oxycodone-Acetaminophen) .Marland Kitchen.. 1 By Mouth Two Times A Day As Needed Severe Pain 11)  Proair Hfa 108 (90 Base) Mcg/act Aers (Albuterol Sulfate) .... 2 Puffs Every 4 Hours As Needed For Wheezing/asthma Flare 12)  Promethazine Hcl 25 Mg Tabs (Promethazine Hcl) .... 1/2 - 1 By Mouth Q8hr As Needed Nausea.  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 (dr hilts) stomach ulcer abnormal thyroid test MRI lumbar spine 01/25/08 -- no spinal stenosis or nerve root  encroachment, bilateral facet hypertrophy @ l4-5 and s1, low t1 marrow signal nonspecific and may represent a normal variant or manifestation of smoking or med use  Past Surgical History: Last updated: 09/22/2008 Tubal ligation-1995 Axillary sweat glands and lymph nodes removed bilaterally 2001  Family History: Last updated: 09/26/2008 Rectal Cancer - mother Family History Breast cancer 1st degree relative <50- sister age 28 Family History of CAD Female 1st degree relative <50- father MI x5  Family History Diabetes 1st degree relative- parents and sister family history of hypertenion  Social History: Last updated: 09/26/2008 Pt lives with her daughter and son ages 8 and 101, and grandson age 4.  She is currently unemployed.  Enjoys reading.  originally from [place].  moved here to [place] in 2007.  disabled 2/2 depression/anxiety  Social History: Packs/Day:  0.5  Review of Systems       as per HPI  Physical Exam  General:  Well-developed,well-nourished,in no acute distress; alert,appropriate and cooperative throughout examination Head:  Normocephalic and atraumatic without obvious abnormalities.  Eyes:  No corneal or conjunctival inflammation noted. EOMI. Perrla. Funduscopic exam benign, without hemorrhages, exudates or papilledema. Vision grossly normal. See vision acuity. No eye drainage. Lungs:  Normal respiratory effort, chest expands symmetrically. Lungs are clear to auscultation, no crackles or wheezes. Heart:  Normal rate and regular rhythm. S1 and S2 normal without gallop, murmur, click, rub or other extra sounds. Neurologic:  Station and gait are normal. Sensory, motor and coordinative functions appear intact.   Impression & Recommendations:  Problem # 1:  Blurred vision I am not sure of cause of blurry vision.  Even though the pt was not able to check her BG level during the episode I do not think that it was 2/2 her BG levels since her BG was WNL today and her A1C  has been greatly improved.   Since there are no red flag symptoms present such as unilateral eye pain, vision loss, or persistent h/a, and since all symptoms have now resolved and there are no findings on physical exam, I feel comfortable with no further work-up at this time.  Pt is to return immediately if any return of symptoms.  Problem # 2:  LOW BACK PAIN, CHRONIC (ICD-724.2) Pt states that she is out of her oxycodone and is having severe back pain.  She requests a refill of her rx to "get her through" until her appt with Dr. Sandi Mealy.  I gave her #15 oxycodone for this.  Her lower back was not examined during this appt since this was a request that she made after the appt was completed.   Her updated medication list for this problem includes:    Methocarbamol 750 Mg Tabs (  Methocarbamol) .Marland Kitchen... 1 tablet by mouth three times a day    Nabumetone 500 Mg Tabs (Nabumetone) .Marland Kitchen... 1 tablet 2-3 times daily with food    Oxycodone-acetaminophen 5-325 Mg Tabs (Oxycodone-acetaminophen) .Marland Kitchen... 1 by mouth two times a day as needed severe pain  Complete Medication List: 1)  Topamax 25 Mg Tabs (Topiramate) .Marland Kitchen.. 1 tablet by mouth three times a day 2)  Methocarbamol 750 Mg Tabs (Methocarbamol) .Marland Kitchen.. 1 tablet by mouth three times a day 3)  Nabumetone 500 Mg Tabs (Nabumetone) .Marland Kitchen.. 1 tablet 2-3 times daily with food 4)  Fexofenadine Hcl 180 Mg Tabs (Fexofenadine hcl) .Marland Kitchen.. 1 tablet by mouth once daily 5)  Promethazine Hcl 25 Mg Tabs (Promethazine hcl) .Marland Kitchen.. 1 tablet by mouth as needed 6)  Alprazolam 0.5 Mg Tabs (Alprazolam) .Marland Kitchen.. 1 tablet by mouth as needed 7)  Metformin Hcl 500 Mg Tabs (Metformin hcl) .... Titrate up as directed to goal 2 at noon and 2 at midnight for diabetes 8)  Nexium 40 Mg Cpdr (Esomeprazole magnesium) .... Take 1 tab each morning 9)  Prodigy Pocket Blood Glucose W/device Kit (Blood glucose monitoring suppl) .... Disp #1 glucometer kit.  please also dispense lancets #100 for kit and glucose test strips  for testing once daily, 1 mo supply. 10)  Oxycodone-acetaminophen 5-325 Mg Tabs (Oxycodone-acetaminophen) .Marland Kitchen.. 1 by mouth two times a day as needed severe pain 11)  Proair Hfa 108 (90 Base) Mcg/act Aers (Albuterol sulfate) .... 2 puffs every 4 hours as needed for wheezing/asthma flare 12)  Promethazine Hcl 25 Mg Tabs (Promethazine hcl) .... 1/2 - 1 by mouth q8hr as needed nausea.  Other Orders: Glucose Cap-[facility] ([address]109) Vision- [facility] (60454) [facility]- Est Level  3 (09811) Prescriptions: OXYCODONE-ACETAMINOPHEN 5-325 MG TABS (OXYCODONE-ACETAMINOPHEN) 1 by mouth two times a day as needed severe pain  #15 x 0   Entered and Authorized by:   Ellin Mayhew MD   Signed by:   Ellin Mayhew MD on 03/06/2009   Method used:   Print then Give to Patient   RxID:   [id number]

## 2010-07-24 NOTE — Assessment & Plan Note (Signed)
Summary: dental pain   Vital Signs:  Patient profile:   45 year old female Height:      63.5 inches Weight:      194 pounds BMI:     33.95 BSA:     1.92 Temp:     98.1 degrees F Pulse rate:   83 / minute BP sitting:   117 / 71  Vitals Entered By: Jone Baseman CMA (Nov 09, 2009 1:49 PM) CC: dental pain Is Patient Diabetic? No Pain Assessment Patient in pain? yes     Location: right lower jaw Intensity: 7   Primary Care Provider:  Ancil Boozer  MD  CC:  dental pain.  History of Present Illness: 1. Dental pain:  Pt has had a broken molar on the bottom right side of her mouth for the past 1.5 months.  She is not sure how it was broken.  When she initially took it she had some pain and swelling.  She took some Percocent and some Amoxicillin that she had left over and it got better.  However, over the past 2 days it has gotten very painful, red, and swollen.  It is painful to eat or to touch.        ROS: denies fevers, headache  Habits & Providers  Alcohol-Tobacco-Diet     Tobacco Status: current     Tobacco Counseling: to quit use of tobacco products     Cigarette Packs/Day: 0.25  Current Medications (verified): 1)  Topamax 25 Mg Tabs (Topiramate) .Marland Kitchen.. 1 Tablet By Mouth Three Times A Day - States She Is Taking It Prn 2)  Methocarbamol 750 Mg Tabs (Methocarbamol) .Marland Kitchen.. 1 Tablet By Mouth Three Times A Day 3)  Nabumetone 500 Mg Tabs (Nabumetone) .Marland Kitchen.. 1 Tablet 2-3 Times Daily With Food 4)  Cetirizine-Pseudoephedrine 5-120 Mg Xr12h-Tab (Cetirizine-Pseudoephedrine) .Marland Kitchen.. 1 By Mouth Once Daily For Allergies/congestion 5)  Metformin Hcl 1000 Mg Tabs (Metformin Hcl) .Marland Kitchen.. 1 By Mouth Two Times A Day For Diabetes 6)  Nexium 40 Mg Cpdr (Esomeprazole Magnesium) .... Take 1 Tab Each Morning 7)  Prodigy Pocket Blood Glucose W/device Kit (Blood Glucose Monitoring Suppl) .... Disp #1 Glucometer Kit.  Please Also Dispense Lancets #100 For Kit and Glucose Test Strips For Testing Once Daily, 1  Mo Supply. 8)  Oxycodone-Acetaminophen 5-325 Mg Tabs (Oxycodone-Acetaminophen) .Marland Kitchen.. 1 By Mouth Two Times A Day As Needed Severe Pain 9)  Proair Hfa 108 (90 Base) Mcg/act Aers (Albuterol Sulfate) .... 2 Puffs Every 4 Hours As Needed For Wheezing/asthma Flare 10)  Maxalt-Mlt 10 Mg Tbdp (Rizatriptan Benzoate) .... As Directed For Migraine 11)  Fluticasone Propionate 50 Mcg/act Susp (Fluticasone Propionate) .... 2 Sprays Each Nostril Once Daily.  Lean Forward When Using Medicine. 12)  Gabapentin 300 Mg Caps (Gabapentin) .Marland Kitchen.. 1 By Mouth At Bedtime For Shoulder Pain.  After 1 Week Increase To Two Times A Day 13)  Promethazine Hcl 25 Mg Tabs (Promethazine Hcl) .... 1/2 - 1 By Mouth Three Times A Day As Needed Nausea 14)  Amoxicillin 500 Mg Caps (Amoxicillin) .... Take 1 Tab Every 8 Hours For 7 Days  Allergies: No Known Drug Allergies  Social History: Reviewed history from 09/26/2008 and no changes required. Pt lives with her daughter and son ages 17 and 72, and grandson age 34.  She is currently unemployed.  Enjoys reading.  [place] from [place].  moved here to [place] in 2007.  disabled 2/2 depression/anxiety  Physical Exam  General:  no acute distress, alert. pleasant.  very difficult to focus.  VS reviewed Head:  normocephalic, atraumatic, and no abnormalities observed.   Mouth:  poor dentition with multiple dental carries.  Worse is bottom right molar which is deteriorated with periodontal inflammation.  Very TTP. Lungs:  Normal respiratory effort, chest expands symmetrically. Lungs are clear to auscultation, no crackles or wheezes. Heart:  Normal rate and regular rhythm. S1 and S2 normal without gallop, murmur, click, rub or other extra sounds.   Impression & Recommendations:  Problem # 1:  DENTAL CARIES (ICD-521.00) Assessment Deteriorated Worsening cavitary lesion with surrounding periodontal disease and inflamation.  Will start on Amoxicillin and continue Percocet for pain.  Will  refer to Dentist for likely extraction. Orders: Dental Referral (Dentist) [facility]- Est Level  3 (10272)  Complete Medication List: 1)  Topamax 25 Mg Tabs (Topiramate) .Marland Kitchen.. 1 tablet by mouth three times a day - states she is taking it prn 2)  Methocarbamol 750 Mg Tabs (Methocarbamol) .Marland Kitchen.. 1 tablet by mouth three times a day 3)  Nabumetone 500 Mg Tabs (Nabumetone) .Marland Kitchen.. 1 tablet 2-3 times daily with food 4)  Cetirizine-pseudoephedrine 5-120 Mg Xr12h-tab (Cetirizine-pseudoephedrine) .Marland Kitchen.. 1 by mouth once daily for allergies/congestion 5)  Metformin Hcl 1000 Mg Tabs (Metformin hcl) .Marland Kitchen.. 1 by mouth two times a day for diabetes 6)  Nexium 40 Mg Cpdr (Esomeprazole magnesium) .... Take 1 tab each morning 7)  Prodigy Pocket Blood Glucose W/device Kit (Blood glucose monitoring suppl) .... Disp #1 glucometer kit.  please also dispense lancets #100 for kit and glucose test strips for testing once daily, 1 mo supply. 8)  Oxycodone-acetaminophen 5-325 Mg Tabs (Oxycodone-acetaminophen) .Marland Kitchen.. 1 by mouth two times a day as needed severe pain 9)  Proair Hfa 108 (90 Base) Mcg/act Aers (Albuterol sulfate) .... 2 puffs every 4 hours as needed for wheezing/asthma flare 10)  Maxalt-mlt 10 Mg Tbdp (Rizatriptan benzoate) .... As directed for migraine 11)  Fluticasone Propionate 50 Mcg/act Susp (Fluticasone propionate) .... 2 sprays each nostril once daily.  lean forward when using medicine. 12)  Gabapentin 300 Mg Caps (Gabapentin) .Marland Kitchen.. 1 by mouth at bedtime for shoulder pain.  after 1 week increase to two times a day 13)  Promethazine Hcl 25 Mg Tabs (Promethazine hcl) .... 1/2 - 1 by mouth three times a day as needed nausea 14)  Amoxicillin 500 Mg Caps (Amoxicillin) .... Take 1 tab every 8 hours for 7 days  Patient Instructions: 1)  I have sent in a prescription for Amoxicillin.  Take it three times a day for 7 days.  This should help with the infection. 2)  We have also put in a referral to a dentist for likely tooth  extraction 3)  Please follow up in clinic in 2 weeks if not better Prescriptions: GABAPENTIN 300 MG CAPS (GABAPENTIN) 1 by mouth at bedtime for shoulder pain.  After 1 week increase to two times a day  #60 x 1   Entered and Authorized by:   Angelena Sole MD   Signed by:   Angelena Sole MD on 11/09/2009   Method used:   Electronically to        [facility] [address] [phone]* (retail)       [address]       [place], [place]  [address]       Ph: [phone] or [phone]       Fax: [phone]   RxID:   [id number] AMOXICILLIN 500  MG CAPS (AMOXICILLIN) Take 1 tab every 8 hours for 7 days  #21 x 0   Entered and Authorized by:   Angelena Sole MD   Signed by:   Angelena Sole MD on 11/09/2009   Method used:   Electronically to        [facility] [address] [phone]* (retail)       [address]       [place], [place]  [address]       Ph: [phone] or [phone]       Fax: [phone]   RxID:   [id number]

## 2010-07-24 NOTE — Assessment & Plan Note (Signed)
Summary: rescheduled from 5/31/eo   Vital Signs:  Patient profile:   45 year old female Height:      63.5 inches Weight:      199.7 pounds BMI:     34.95 Temp:     98.1 degrees F oral Pulse rate:   87 / minute BP sitting:   106 / 76  (left arm) Cuff size:   regular  Vitals Entered By: Garen Grams LPN (December 07, 2009 9:02 AM) CC: multiple complaints Is Patient Diabetic? Yes Did you bring your meter with you today? No Pain Assessment Patient in pain? no        Primary Care Provider:  Ancil Boozer  MD  CC:  multiple complaints.  History of Present Illness: R wrist pain: started a few weeks ago.  hurts with flexing the wrist primarily.  unsure of cause.  no numbness or tingling.  no weakness.  improving some today.    L index finger pain: occurred randomly a few weeks ago.  very tender to touch and stiff. has since completely resolved.    left great toe pain: tender over extensor tendon now for a while and over joint.  unsure if red or hot.  grandfather had gout.  currently only mildly tender.     questions about thyroid: wants to review results  from thyroid function tests and thyroid ultrasound   f/u diabetes: reports she has only been taking her metformin infrequently.  she would like to switch back to 500 tabs for ease of use.  denies GI side effects.  not checking her sugars regularly.   tobacco abuse: interested in quitting and resources available.   Habits & Providers  Alcohol-Tobacco-Diet     Tobacco Status: current     Tobacco Counseling: to quit use of tobacco products     Cigarette Packs/Day: 0.25  Current Medications (verified): 1)  Topamax 25 Mg Tabs (Topiramate) .Marland Kitchen.. 1 Tablet By Mouth Three Times A Day For Headache Prevention 2)  Methocarbamol 750 Mg Tabs (Methocarbamol) .Marland Kitchen.. 1 Tablet By Mouth Three Times A Day 3)  Nabumetone 500 Mg Tabs (Nabumetone) .Marland Kitchen.. 1 Tablet 2-3 Times Daily With Food 4)  Cetirizine-Pseudoephedrine 5-120 Mg Xr12h-Tab  (Cetirizine-Pseudoephedrine) .Marland Kitchen.. 1 By Mouth Once Daily For Allergies/congestion 5)  Metformin Hcl 500 Mg Tabs (Metformin Hcl) .... 2 By Mouth Two Times A Day For Diabetes Control. 6)  Nexium 40 Mg Cpdr (Esomeprazole Magnesium) .... Take 1 Tab Each Morning 7)  Prodigy Pocket Blood Glucose W/device Kit (Blood Glucose Monitoring Suppl) .... Disp #1 Glucometer Kit.  Please Also Dispense Lancets #100 For Kit and Glucose Test Strips For Testing Once Daily, 1 Mo Supply. 8)  Oxycodone-Acetaminophen 5-325 Mg Tabs (Oxycodone-Acetaminophen) .Marland Kitchen.. 1 By Mouth Two Times A Day As Needed Severe Pain 9)  Proair Hfa 108 (90 Base) Mcg/act Aers (Albuterol Sulfate) .... 2 Puffs Every 4 Hours As Needed For Wheezing/asthma Flare 10)  Maxalt-Mlt 10 Mg Tbdp (Rizatriptan Benzoate) .... As Directed For Migraine 11)  Fluticasone Propionate 50 Mcg/act Susp (Fluticasone Propionate) .... 2 Sprays Each Nostril Once Daily.  Lean Forward When Using Medicine. 12)  Gabapentin 300 Mg Caps (Gabapentin) .Marland Kitchen.. 1 By Mouth Two Times A Day For Pain 13)  Promethazine Hcl 25 Mg Tabs (Promethazine Hcl) .... 1/2 - 1 By Mouth Three Times A Day As Needed Nausea 14)  Lisinopril 5 Mg Tabs (Lisinopril) .Marland Kitchen.. 1 Tablet Daily For Kidney Protection  Allergies (verified): No Known Drug Allergies  Past History:  Past medical, surgical,  family and social histories (including risk factors) reviewed for relevance to current acute and chronic problems.  Past Medical History: Depression and mental illness NOS- has been seen at mental health but doesn't like to go to them so often seeks help at [facility].  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  has pain contract h/o stomach ulcer h/o abnormal thyroid test and with thyroid nodules on ultrasound -  due for follow up ultrasound 03/2010 MRI lumbar spine 01/25/08 -- no spinal stenosis or nerve root encroachment, bilateral facet hypertrophy @ l4-5 and s1, low t1 marrow signal nonspecific and may represent a normal variant or manifestation of smoking or med use  compliance issues.   Past Surgical History: Reviewed history from 09/22/2008 and no changes required. Tubal ligation-1995 Axillary sweat glands and lymph nodes removed bilaterally 2001  Family History: Reviewed history from 05/09/2009 and no changes required. Rectal Cancer - mother Family History Breast cancer 1st degree relative <50- sister age 75 Family History of CAD Female 1st degree relative <50- father MI x5  Family History Diabetes 1st degree relative- parents and sister family history of hypertenion family history of gout (Grandfather)  Social History: Reviewed history from 09/26/2008 and no changes required. Pt lives with her daughter and son ages 70 and 20, and grandson age 55.  She is currently unemployed.  Enjoys reading.  originally from [place].  moved here to [place] in 2007.  disabled 2/2 depression/anxiety  Review of Systems       see HPI  Physical Exam  General:  no acute distress, alert. pleasant.  very difficult to focus.  VS reviewed Lungs:  Normal respiratory effort, chest expands symmetrically. Lungs are clear to auscultation, no crackles or wheezes. Heart:  Normal rate and regular rhythm. S1 and S2 normal without gallop, murmur, click, rub or other extra sounds. Msk:  R wrist nontender with FROM.  strength 5/5 in hand.  no obvious abnormality  L index finger nontender with FROM and no obvious abnormaltiy  L great toe mildly tender over extensor tendon but no erythema or warmth. no obvious abnormality.  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  advised wrist splint as needed for wrist pain return if finger pain worsens check uric acid level for toe to make sure not gout but suspect also this is somatization as examination is also WNL f/u as needed continue as needed meds that she is on chronically  Orders: [facility]- Est  Level 4 (99214)  Problem # 2:  DIABETES MELLITUS, TYPE II (ICD-250.00) Assessment: Unchanged at goal.  add lisinopril in low dose to provide kidney function given 5mg  tabs - start with 1/2 tab daily for now.  f/u renal fxn in 1 month   Her updated medication list for this problem includes:    Metformin Hcl 500 Mg Tabs (Metformin hcl) .Marland Kitchen... 2 by mouth two times a day for diabetes control.    Lisinopril 5 Mg Tabs (Lisinopril) .Marland Kitchen... 1 tablet daily for kidney protection  Orders: A1C-[facility] (23557) Comp Met-[facility] [id number]) Lipid-[facility] ([address]-28[address]) [facility]- Est  Level 4 (17616)  Labs Reviewed: Creat: 0.80 (09/26/2008)     Last  Eye Exam: No diabetic retinopathy.    (02/03/2009) Reviewed HgBA1c results: 6.3 (12/07/2009)  6.1 (09/08/2009)  Problem # 3:  THYROMEGALY (ICD-240.9) Assessment: Unchanged  reviewed results with her and need for f/u ultrasound 03/2010.    Orders: [facility]- Est  Level 4 ([address])  Problem # 4:  DEPRESSION (ICD-311) Assessment: Unchanged  reports she needs refill on alprazolam that she had been getting frmo psychatry but that she cannot go back to psych due to owing a bill. asks if we can fill but she cannot remember the dose she takes as needed.  asked her to call with dosing and she agrees so that we can fill for the time being.  Orders: [facility]- Est  Level 4 ([address])  Complete Medication List: 1)  Topamax 25 Mg Tabs (Topiramate) .Marland Kitchen.. 1 tablet by mouth three times a day for headache prevention 2)  Methocarbamol 750 Mg Tabs (Methocarbamol) .Marland Kitchen.. 1 tablet by mouth three times a day 3)  Nabumetone 500 Mg Tabs (Nabumetone) .Marland Kitchen.. 1 tablet 2-3 times daily with food 4)   Cetirizine-pseudoephedrine 5-120 Mg Xr12h-tab (Cetirizine-pseudoephedrine) .Marland Kitchen.. 1 by mouth once daily for allergies/congestion 5)  Metformin Hcl 500 Mg Tabs (Metformin hcl) .... 2 by mouth two times a day for diabetes control. 6)  Nexium 40 Mg Cpdr (Esomeprazole magnesium) .... Take 1 tab each morning 7)  Prodigy Pocket Blood Glucose W/device Kit (Blood glucose monitoring suppl) .... Disp #1 glucometer kit.  please also dispense lancets #100 for kit and glucose test strips for testing once daily, 1 mo supply. 8)  Oxycodone-acetaminophen 5-325 Mg Tabs (Oxycodone-acetaminophen) .Marland Kitchen.. 1 by mouth two times a day as needed severe pain 9)  Proair Hfa 108 (90 Base) Mcg/act Aers (Albuterol sulfate) .... 2 puffs every 4 hours as needed for wheezing/asthma flare 10)  Maxalt-mlt 10 Mg Tbdp (Rizatriptan benzoate) .... As directed for migraine 11)  Fluticasone Propionate 50 Mcg/act Susp (Fluticasone propionate) .... 2 sprays each nostril once daily.  lean forward when using medicine. 12)  Gabapentin 300 Mg Caps (Gabapentin) .Marland Kitchen.. 1 by mouth two times a day for pain 13)  Promethazine Hcl 25 Mg Tabs (Promethazine hcl) .... 1/2 - 1 by mouth three times a day as needed nausea 14)  Lisinopril 5 Mg Tabs (Lisinopril) .Marland Kitchen.. 1 tablet daily for kidney protection  Other Orders: Uric Acid-[facility] ([id number])  Patient Instructions: 1)  Try using some aspercreme (generic is fine) for the toe pain.  2)  Call me with the dose of your alprazolam that you take.  3)  Start taking the new medicine called lisinopril.  I have prescribed 5mg  tablet but only take 1/2 of a tablet daily - this is for kidney protection.  4)  Look into the smoking classes here that are free. 5)  For the wrist pain - use a carpal tunnel brace as needed.  6)  Please follow up in 1 month so we can make sure the toe is doing okay and your kidneys are doing okay with the new medicine to help protect them. Prescriptions: OXYCODONE-ACETAMINOPHEN 5-325 MG TABS  (OXYCODONE-ACETAMINOPHEN) 1 by mouth two times a day as needed severe pain  #45 x 0   Entered and Authorized by:   Ancil Boozer  MD   Signed by:   Ancil Boozer  MD on 12/07/2009   Method used:   Handwritten   RxID:   [id number] PROMETHAZINE HCL 25 MG TABS (PROMETHAZINE HCL) 1/2 - 1 by mouth three times a day as needed nausea  #30 x  1   Entered and Authorized by:   Ancil Boozer  MD   Signed by:   Ancil Boozer  MD on 12/07/2009   Method used:   [facility]ron[facility] to        [facility] [address] [phone]* (retail)       [address]       [place], [place]  [address]       Ph: [phone] or [phone]       Fax: [phone]   RxID:   [id number] METFORMIN HCL 500 MG TABS (METFORMIN HCL) 2 by mouth two times a day for diabetes control.  #120 x 6   Entered and Authorized by:   Ancil Boozer  MD   Signed by:   Ancil Boozer  MD on 12/07/2009   Method used:   [facility]        [facility] [address] [phone]* (retail)       [address]       [place], [place]  [address]       Ph: [phone] or [phone]       Fax: [phone]   RxID:   [id number] GABAPENTIN 300 MG CAPS (GABAPENTIN) 1 by mouth two times a day for pain  #60 x 6   Entered and Authorized by:   Ancil Boozer  MD   Signed by:   Ancil Boozer  MD on 12/07/2009   Method used:   [facility]ron[facility] to        [facility] [address] [phone]* (retail)       [address]       [place], [place]  [address]       Ph: [phone] or [phone]       Fax: [phone]   RxID:   [id number] MAXALT-MLT 10 MG TBDP (RIZATRIPTAN BENZOATE) as directed for migraine  #9 x 11   Entered and Authorized by:   Ancil Boozer  MD   Signed by:   Ancil Boozer  MD on 12/07/2009   Method used:   [facility]ronically to        [facility] [address] [phone]* (retail)       [address]       [place], [place]  [address]       Ph: [phone]  or [phone]       Fax: [phone]   RxID:   [id number] NEXIUM 40 MG CPDR (ESOMEPRAZOLE MAGNESIUM) Take 1 tab each morning  #30 x 11   Entered and Authorized by:   Ancil Boozer  MD   Signed by:   Ancil Boozer  MD on 12/07/2009   Method used:   [facility]ronically to        [facility] [address] [phone]* (retail)       [address]       [place], [place]  [address]       Ph: [phone] or [phone]       Fax: [phone]   RxID:   [id number] NABUMETONE 500 MG TABS (NABUMETONE) 1 tablet 2-3 times daily with food  #90 x 1   Entered and Authorized by:   Ancil Boozer  MD   Signed by:   Ancil Boozer  MD on 12/07/2009   Method used:  Electronically to        [facility] [address] [phone]* (retail)       [address]       [place], [place]  [address]       Ph: [phone] or [phone]       Fax: [phone]   RxID:   [id number] METHOCARBAMOL 750 MG TABS (METHOCARBAMOL) 1 tablet by mouth three times a day  #90 x 1   Entered and Authorized by:   Ancil Boozer  MD   Signed by:   Ancil Boozer  MD on 12/07/2009   Method used:   [facility]ronically to        [facility] [address] [phone]* (retail)       [address]       [place], [place]  [address]       Ph: [phone] or [phone]       Fax: [phone]   RxID:   [id number] TOPAMAX 25 MG TABS (TOPIRAMATE) 1 tablet by mouth three times a day for headache prevention  #90 x 6   Entered and Authorized by:   Ancil Boozer  MD   Signed by:   Ancil Boozer  MD on 12/07/2009   Method used:   [facility]ronically to        [facility] [address] [phone]* (retail)       [address]       [place], [place]  [address]       Ph: [phone] or [phone]       Fax: [phone]   RxID:   [id number] LISINOPRIL 5 MG TABS (LISINOPRIL) 1 tablet daily for kidney protection  #30 x 6   Entered and Authorized by:   Ancil Boozer  MD    Signed by:   Ancil Boozer  MD on 12/07/2009   Method used:   [facility]ronically to        [facility] [address] [phone]* (retail)       [address]       [place], [place]  [address]       Ph: [phone] or [phone]       Fax: [phone]   RxID:   [id number]   Laboratory Results   Blood Tests   Date/Time Received: December 07, 2009 8:49 AM  Date/Time Reported: December 07, 2009 9:05 AM   HGBA1C: 6.3%   (Normal Range: Non-Diabetic - 3-6%   Control Diabetic - 6-8%)  Comments: ...............test performed by.................Marland KitchenGaren Grams, LPN .............entered by...........Marland KitchenBonnie A. [place], MLS (ASCP)cm       Prevention & Chronic Care Immunizations   Influenza vaccine: Not documented    Tetanus booster: Not documented    Pneumococcal vaccine: Not documented  Other Screening   Pap smear: Not documented    Mammogram: Not documented   Smoking status: current  (12/07/2009)  Diabetes Mellitus   HgbA1C: 6.3  (12/07/2009)   Hemoglobin A1C due: 12/22/2008    Eye exam: No diabetic retinopathy.     (02/03/2009)   Eye exam due: 02/03/2010    Foot exam: yes  (05/24/2009)   High risk foot: Not documented   Foot care education: completed  (10/24/2008)   Foot exam due: 10/24/2009    Urine microalbumin/creatinine ratio: Not documented   Urine microalbumin action/deferral: Not indicated  Diabetes flowsheet reviewed?: Yes   Progress toward A1C goal: At goal  Lipids   Total Cholesterol: 135  (09/26/2008)   LDL: 62  (09/26/2008)   LDL Direct: Not documented   HDL: 41  (09/26/2008)   Triglycerides: 160  (09/26/2008)  Self-Management Support :   Personal Goals (by the next clinic visit) :     Personal A1C goal: 8  (05/24/2009)     Personal blood pressure goal: 130/80  (05/24/2009)     Personal LDL goal: 100  (05/24/2009)    Diabetes self-management support: Not documented    Diabetes self-management support not done because: Good  outcomes  (05/24/2009)   Last diabetes self-management training by diabetes educator: completed

## 2010-07-24 NOTE — Progress Notes (Signed)
Summary: phn msg  Phone Note Call from Patient Call back at [facility] Phone [phone]   Caller: Patient Summary of Call: Alprazalam 0.5mg  1 tab 3xdaily Initial call taken by: De Nurse,  December 07, 2009 1:45 PM  Follow-up for Phone Call        available for pick up at front desk Follow-up by: Ancil Boozer  MD,  December 07, 2009 1:48 PM    New/Updated Medications: ALPRAZOLAM 0.5 MG TABS (ALPRAZOLAM) 1 by mouth three times a day as needed severe anxiety.  may refill every 30 days Prescriptions: ALPRAZOLAM 0.5 MG TABS (ALPRAZOLAM) 1 by mouth three times a day as needed severe anxiety.  may refill every 30 days  #90 x 1   Entered and Authorized by:   Ancil Boozer  MD   Signed by:   Ancil Boozer  MD on 12/08/2009   Method used:   Handwritten   RxID:   [id number]

## 2010-07-24 NOTE — Progress Notes (Signed)
Summary: Triage  Phone Note Call from Patient Call back at Home Phone [phone]   Reason for Call: Talk to Nurse Summary of Call: cannot open anything with right hand, having pain Initial call taken by: Knox Royalty,  January 03, 2010 4:27 PM  Follow-up for Phone Call        seen last week about knot on  neck, has not filled Rx due to ins, hurts to try to open up anything, stated "not hurting hurting" but aching, hurts  more when she tries to use it. Has not obtain Rx for methocarbamol ( see note before this), pt states that her current med is not working and want methocarbamol or something that will help, told her I would send message to provider, very upset that she is in pain for a week   Follow-up by: Gladstone Pih,  January 03, 2010 4:53 PM  Additional Follow-up for Phone Call Additional follow up Details #1::        Continues to have muscle spams (though somewhat improved). Patient reports that methocarbamol does not seem to work. Will d/c methocarbamol (advised patient to stop this medication)- switch to Flexeril. If Flexeril does not work will switch to robaxin (would be able to get prior authorization at that time as would have failed two preferred meds). Advised to pick up script for flexeril - patient to see me next week if no improvement, advised to call for apt.  Additional Follow-up by: Bobby Rumpf  MD,  January 04, 2010 10:17 AM    New/Updated Medications: CYCLOBENZAPRINE HCL 5 MG TABS (CYCLOBENZAPRINE HCL) one tab by mouth three times a day as needed pain and muscle spasm (if causes drowsiness take at night only) Prescriptions: CYCLOBENZAPRINE HCL 5 MG TABS (CYCLOBENZAPRINE HCL) one tab by mouth three times a day as needed pain and muscle spasm (if causes drowsiness take at night only)  #60 x 0   Entered by:   Bobby Rumpf  MD   Authorized by:   Ellery Plunk MD   Signed by:   Bobby Rumpf  MD on 01/04/2010   Method used:   Electronically to        [facility]  [phone]* (retail)       [address]       [place], [place]  [address]       Ph: [phone] or [phone]       Fax: [phone]   RxID:   [id number]

## 2010-07-25 ENCOUNTER — Ambulatory Visit (INDEPENDENT_AMBULATORY_CARE_PROVIDER_SITE_OTHER): Payer: Medicare Other | Admitting: Family Medicine

## 2010-07-25 ENCOUNTER — Encounter: Payer: Self-pay | Admitting: Family Medicine

## 2010-07-25 DIAGNOSIS — Z202 Contact with and (suspected) exposure to infections with a predominantly sexual mode of transmission: Secondary | ICD-10-CM

## 2010-07-25 DIAGNOSIS — A5901 Trichomonal vulvovaginitis: Secondary | ICD-10-CM | POA: Insufficient documentation

## 2010-07-25 DIAGNOSIS — N760 Acute vaginitis: Secondary | ICD-10-CM

## 2010-07-25 LAB — CONVERTED CEMR LAB
Chlamydia, DNA Probe: NEGATIVE
Chlamydia, DNA Probe: NEGATIVE
GC Probe Amp, Genital: NEGATIVE
GC Probe Amp, Genital: NEGATIVE
HIV: NONREACTIVE
HIV: NONREACTIVE
Whiff Test: POSITIVE

## 2010-07-25 LAB — RPR

## 2010-07-26 LAB — GC/CHLAMYDIA PROBE AMP, GENITAL
Chlamydia, DNA Probe: NEGATIVE
GC Probe Amp, Genital: NEGATIVE

## 2010-07-26 LAB — HIV ANTIBODY (ROUTINE TESTING W REFLEX): HIV: NONREACTIVE

## 2010-07-27 ENCOUNTER — Encounter: Payer: Self-pay | Admitting: Family Medicine

## 2010-07-30 ENCOUNTER — Telehealth: Payer: Self-pay | Admitting: *Deleted

## 2010-08-01 NOTE — Letter (Signed)
Summary: Generic Letter  [facility]  [address]   [place], [place] [address]   Phone: [phone]  Fax: [phone]    07/27/2010  Kendra Hall [address] [place], [place]  [address]  [place]  Dear Ms. Afshar,   Your other tests were negative        Sincerely,   Angelena Sole MD  Appended Document: Generic Letter mailed

## 2010-08-01 NOTE — Assessment & Plan Note (Signed)
Summary: vag prob,df   Vital Signs:  Patient profile:   45 year old female Height:      63.5 inches Weight:      200 pounds BMI:     35.00 BSA:     1.95 Pulse rate:   100 / minute BP sitting:   118 / 70  Vitals Entered By: Jone Baseman CMA (July 25, 2010 2:26 PM) CC: vaginal discharge Is Patient Diabetic? No Pain Assessment Patient in pain? no        Primary Care Provider:  Bobby Rumpf  MD  CC:  vaginal discharge.  History of Present Illness: 1. Vaginal discharge:  Pt has had this for over 1 week.  Started before her period.  Last period ended a couple of days ago.  Since her period ended the discharge has been worse.  Described as a whitish discharge with a foul odor.  Some associated vaginal itching.  Not getting better, maybe a little worse.  Concerned that she could have other STDs.  ROS: denies pelvic pain, abdominal pain, fevers, denies dysuria  SocHx:  She has been with a new sexual partner (first one in 5 years)  Habits & Providers  Alcohol-Tobacco-Diet     Alcohol drinks/day: 0     Tobacco Status: current     Tobacco Counseling: to quit use of tobacco products     Cigarette Packs/Day: 0.25  Current Medications (verified): 1)  Topamax 25 Mg Tabs (Topiramate) .Marland Kitchen.. 1 Tablet By Mouth Three Times A Day For Headache Prevention 2)  Nabumetone 500 Mg Tabs (Nabumetone) .Marland Kitchen.. 1 Tablet 2-3 Times Daily With Food 3)  Metformin Hcl 500 Mg Tabs (Metformin Hcl) .... 2 By Mouth Two Times A Day For Diabetes Control. 4)  Nexium 40 Mg Cpdr (Esomeprazole Magnesium) .... Take 1 Tab Each Morning 5)  Prodigy Pocket Blood Glucose W/device Kit (Blood Glucose Monitoring Suppl) .... Disp #1 Glucometer Kit.  Please Also Dispense Lancets #100 For Kit and Glucose Test Strips For Testing Once Daily, 1 Mo Supply. 6)  Oxycodone-Acetaminophen 5-325 Mg Tabs (Oxycodone-Acetaminophen) .Marland Kitchen.. 1 By Mouth Two Times A Day As Needed Severe Pain 7)  Proair Hfa 108 (90 Base) Mcg/act Aers (Albuterol  Sulfate) .... 2 Puffs Every 4 Hours As Needed For Wheezing/asthma Flare 8)  Maxalt-Mlt 10 Mg Tbdp (Rizatriptan Benzoate) .... As Directed For Migraine 9)  Fluticasone Propionate 50 Mcg/act Susp (Fluticasone Propionate) .... 2 Sprays Each Nostril Once Daily.  Lean Forward When Using Medicine. 10)  Gabapentin 300 Mg Caps (Gabapentin) .Marland Kitchen.. 1 By Mouth Two Times A Day For Pain 11)  Promethazine Hcl 25 Mg Tabs (Promethazine Hcl) .... 1/2 - 1 By Mouth Three Times A Day As Needed Nausea 12)  Lisinopril 5 Mg Tabs (Lisinopril) .Marland Kitchen.. 1 Tablet Daily For Kidney Protection 13)  Alprazolam 0.5 Mg Tabs (Alprazolam) .Marland Kitchen.. 1 By Mouth Three Times A Day As Needed Severe Anxiety.  May Refill Every 30 Days 14)  Cyclobenzaprine Hcl 10 Mg Tabs (Cyclobenzaprine Hcl) .... One Tab By Mouth Three Times A Day (Take Only At Night If Causes Drowsiness)  Allergies: No Known Drug Allergies  Past History:  Past Medical History: Reviewed history from 12/07/2009 and no changes required. Depression and mental illness NOS- has been seen at mental health but doesn't like to go to them so often seeks [facility] at [facility].  struggles with agenda setting and often has trouble adjusting to different physicians due to her mental illness Diabetes mellitus, type II Migraines Arthritis in spine  with history of facet joint injections (dr hilts) - on narcotics as needed for flares.  has pain contract h/o stomach ulcer h/o abnormal thyroid test and with thyroid nodules on ultrasound - due for follow up ultrasound 03/2010 MRI lumbar spine 01/25/08 -- no spinal stenosis or nerve root encroachment, bilateral facet hypertrophy @ l4-5 and s1, low t1 marrow signal nonspecific and may represent a normal variant or manifestation of smoking or med use  compliance issues.   Social History: Reviewed history from 09/26/2008 and no changes required. Pt lives with her daughter and son ages 91 and 41, and grandson age 45.  She is currently unemployed.  Enjoys  reading.  [place]iginally from [place].  moved here to [place] in 2007.  disabled 2/2 depression/anxiety  Physical Exam  General:  Vitals reviewed.  Overweight.  NAD Lungs:  normal respiratory effort.   Heart:  normal rate and regular rhythm.   Abdomen:  soft, non-tender, normal bowel sounds, no distention, no masses, no guarding, and no rigidity.   Genitalia:  normal introitus, no external lesions, no adnexal masses or tenderness, and vaginal discharge.  No cervical motion tenderness Psych:  flat affect.     Impression & Recommendations:  Problem # 1:  BACTERIAL VAGINITIS (ICD-616.10) Assessment New Treat with Flagyl Her updated medication list for this problem includes:    Metronidazole 500 Mg Tabs (Metronidazole) .Marland Kitchen... 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 1 tablet by mouth three times a day for headache prevention 2)  Nabumetone 500 Mg Tabs (Nabumetone) .Marland Kitchen.. 1 tablet 2-3 times daily with food 3)  Metformin Hcl 500 Mg Tabs (Metformin hcl) .... 2 by mouth two times a day for diabetes control. 4)  Nexium 40 Mg Cpdr (Esomeprazole magnesium) .... Take 1 tab each morning 5)  Prodigy Pocket Blood Glucose W/device Kit (Blood glucose monitoring suppl) .... Disp #1 glucometer kit.  please also dispense lancets #100 for kit and glucose test strips for testing once daily, 1 mo supply. 6)  Oxycodone-acetaminophen 5-325 Mg Tabs (Oxycodone-acetaminophen) .Marland Kitchen.. 1 by mouth two times a day as needed severe pain 7)  Proair Hfa 108 (90 Base) Mcg/act Aers (Albuterol sulfate) .... 2 puffs  every 4 hours as needed for wheezing/asthma flare 8)  Maxalt-mlt 10 Mg Tbdp (Rizatriptan benzoate) .... As directed for migraine 9)  Fluticasone Propionate 50 Mcg/act Susp (Fluticasone propionate) .... 2 sprays each nostril once daily.  lean forward when using medicine. 10)  Gabapentin 300 Mg Caps (Gabapentin) .Marland Kitchen.. 1 by mouth two times a day for pain 11)  Promethazine Hcl 25 Mg Tabs (Promethazine hcl) .... 1/2 - 1 by mouth three times a day as needed nausea 12)  Lisinopril 5 Mg Tabs (Lisinopril) .Marland Kitchen.. 1 tablet daily for kidney protection 13)  Alprazolam 0.5 Mg Tabs (Alprazolam) .Marland Kitchen.. 1 by mouth three times a day as needed severe anxiety.  may refill every 30 days 14)  Cyclobenzaprine Hcl 10 Mg Tabs (Cyclobenzaprine hcl) .... One tab by mouth three times a day (take only at night if causes drowsiness) 15)  Metronidazole 500 Mg Tabs (Metronidazole) .Marland KitchenMarland KitchenMarland Kitchen  1 tab by mouth twice a day for 7 days  Other Orders: GC/Chlamydia[facility] ([id number]) [facility] Prescriptions: METRONIDAZOLE 500 MG TABS (METRONIDAZOLE) 1 tab by mouth twice a day for 7 days  #14 x 0   Entered and Authorized by:   Angelena Sole MD   Signed by:   Angelena Sole MD on 07/25/2010   Method used:   Electronically to        [facility] [phone]* (retail)       [address]       [place], [place]  [address]       Ph: [phone] or [phone]       Fax: [phone]   RxID:   [id number]    Orders Added: 1)  GC/Chlamydia-[facility] [87591/87491] 2)  Mellody Drown Prep- [facility] [87210] 3)  HIV-[facility] [[id number]] 4)  RPR-[facility] [[id number]] 5)  [facility]- Est  Level 4 [37628]    Laboratory Results  Date/Time Received: July 25, 2010 2:45 PM  Date/Time Reported: July 25, 2010 3:07 PM   [employer] Source: vag WBC/hpf: loaded Bacteria/hpf: 3+  Cocci Clue cells/hpf: many  Positive whiff Yeast/hpf: none Trichomonas/hpf: many Comments: ...............test performed by......Marland KitchenBonnie A.  [place], MLS (ASCP)cm

## 2010-08-09 NOTE — Progress Notes (Signed)
Summary: refill  Phone Note Refill Request Call back at Home Phone [phone] Message from:  Patient  Refills Requested: Medication #1:  OXYCODONE-ACETAMINOPHEN 5-325 MG TABS 1 by mouth two times a day as needed severe pain Please call when ready  Initial call taken by: De Nurse,  July 30, 2010 3:56 PM  Follow-up for Phone Call        Please call in Rx and notify patient Thanks LC Follow-up by: Pearlean Brownie MD,  July 31, 2010 2:02 PM  Additional Follow-up for Phone Call Additional follow up Details #1::        I handwrote Additional Follow-up by: Pearlean Brownie MD,  July 31, 2010 6:21 PM    Additional Follow-up for Phone Call Additional follow up Details #2::    Pt informed that Rx is ready for pickup at the front desk Follow-up by: Jone Baseman CMA,  August 01, 2010 8:10 AM  Prescriptions: OXYCODONE-ACETAMINOPHEN 5-325 MG TABS (OXYCODONE-ACETAMINOPHEN) 1 by mouth two times a day as needed severe pain  #45 x 0   Entered and Authorized by:   Pearlean Brownie MD   Signed by:   Pearlean Brownie MD on 07/31/2010   Method used:   Telephoned to ...       [facility] [address] [phone]* (retail)       [address]       [place], [place]  [address]       Ph: [phone] or [phone]       Fax: [phone]   RxID:   [id number]

## 2010-08-30 ENCOUNTER — Encounter: Payer: Self-pay | Admitting: Family Medicine

## 2010-08-30 ENCOUNTER — Telehealth: Payer: Self-pay | Admitting: Family Medicine

## 2010-08-30 ENCOUNTER — Ambulatory Visit (INDEPENDENT_AMBULATORY_CARE_PROVIDER_SITE_OTHER): Payer: Medicare Other | Admitting: Family Medicine

## 2010-08-30 VITALS — BP 117/75 | HR 94 | Temp 98.0°F | Wt 195.0 lb

## 2010-08-30 DIAGNOSIS — J019 Acute sinusitis, unspecified: Secondary | ICD-10-CM

## 2010-08-30 DIAGNOSIS — J069 Acute upper respiratory infection, unspecified: Secondary | ICD-10-CM

## 2010-08-30 MED ORDER — AMOXICILLIN-POT CLAVULANATE 875-125 MG PO TABS: 1.0000 | ORAL_TABLET | Freq: Two times a day (BID) | ORAL | Status: AC

## 2010-08-30 NOTE — Telephone Encounter (Signed)
Patient would like you to call her regarding changing dr's.

## 2010-08-30 NOTE — Progress Notes (Signed)
  Subjective:    Patient ID: Kendra Hall, female    DOB: 1966/01/30, 45 y.o.   MRN: [id number]  HPI  Sinus pressure x 1 day, nasal pressure/congestion, ears popping, +headache and pain behind eyes, mild sore throat , +cough- non productive , states cough from post nasal drip    Initially thought this was her allergies, has some nasal sprays but did not use them, Temp 101F last night, this feels like her other bad sinus infections   No difficulty breathing, no OTC meds  Smoking-- 4-5cig per day   +sick contacts with grandson    Review of Systems per above     Objective:   Physical Exam     GEN- NAD, fatigued appearing, vitals noted    HEENT- PERRL, EOMI, fundoscopic exam neg, TTP ethmoid, maxillary and frontal sinus, erythema to oropharyx, MMM  Neck- +cervical LAD  RESP- CTAB  [facility]- RRR, no murmur       Assessment & Plan:  Acute sinusitis- though likely viral and this was reiterated to patient , very early into course, she does have history of sinus disease per report, she is also an uncontrolled diabetic and smoker, continue flonase  Viral URI- see above

## 2010-08-30 NOTE — Patient Instructions (Signed)
Start the Augmentin for the sinus infection  Use the flonase as needed If you do not improve then come back to be seen Keep hydrated You can take Tylenol if you are not taking the percocet

## 2010-08-31 NOTE — Telephone Encounter (Signed)
Spoke with patient and explained that she would be getting a phone call from our medical director to discuss her concerns with Dr. Wallene Huh.

## 2010-09-04 NOTE — Telephone Encounter (Signed)
Would like a female doctor.  No concerns about Dr Wallene Huh Would like to be switched to another team since a while ago she overheard one of the CS on blue team say outside the room "she is not acting crazy today" to Dr Sandi Mealy Will change to female on another team - Dr Cristal Ford

## 2010-09-28 LAB — GLUCOSE, CAPILLARY: Glucose-Capillary: 128 mg/dL — ABNORMAL HIGH (ref 70–99)

## 2010-10-04 LAB — POCT I-STAT, CHEM 8
BUN: 10 mg/dL (ref 6–23)
Calcium, Ion: 1.13 mmol/L (ref 1.12–1.32)
Chloride: 96 mEq/L (ref 96–112)
Creatinine, Ser: 1 mg/dL (ref 0.4–1.2)
Glucose, Bld: 468 mg/dL — ABNORMAL HIGH (ref 70–99)
HCT: 43 % (ref 36.0–46.0)
Hemoglobin: 14.6 g/dL (ref 12.0–15.0)
Potassium: 4.1 mEq/L (ref 3.5–5.1)
Sodium: 135 mEq/L (ref 135–145)
TCO2: 30 mmol/L (ref 0–100)

## 2010-10-04 LAB — GLUCOSE, CAPILLARY: Glucose-Capillary: 311 mg/dL — ABNORMAL HIGH (ref 70–99)

## 2010-10-04 LAB — POCT URINALYSIS DIP (DEVICE)
Bilirubin Urine: NEGATIVE
Glucose, UA: 500 mg/dL — AB
Hgb urine dipstick: NEGATIVE
Ketones, ur: NEGATIVE mg/dL
Nitrite: NEGATIVE
Protein, ur: NEGATIVE mg/dL
Specific Gravity, Urine: <=1.005 (ref 1.005–1.030)
Urobilinogen, UA: 0.2 mg/dL (ref 0.0–1.0)
pH: 5.5 (ref 5.0–8.0)

## 2010-10-04 LAB — CULTURE, ROUTINE-ABSCESS

## 2010-11-16 ENCOUNTER — Other Ambulatory Visit (HOSPITAL_COMMUNITY)
Admission: RE | Admit: 2010-11-16 | Discharge: 2010-11-16 | Disposition: A | Discharge status: Home / Self Care | Payer: Medicare Other | Source: Ambulatory Visit | Attending: Family Medicine | Admitting: Family Medicine

## 2010-11-16 ENCOUNTER — Ambulatory Visit (INDEPENDENT_AMBULATORY_CARE_PROVIDER_SITE_OTHER): Payer: Medicare Other | Admitting: Family Medicine

## 2010-11-16 ENCOUNTER — Other Ambulatory Visit: Payer: Self-pay | Admitting: Family Medicine

## 2010-11-16 ENCOUNTER — Encounter: Payer: Self-pay | Admitting: Family Medicine

## 2010-11-16 VITALS — BP 137/86 | HR 83 | Ht 64.0 in | Wt 198.0 lb

## 2010-11-16 DIAGNOSIS — E049 Nontoxic goiter, unspecified: Secondary | ICD-10-CM

## 2010-11-16 DIAGNOSIS — K219 Gastro-esophageal reflux disease without esophagitis: Secondary | ICD-10-CM

## 2010-11-16 DIAGNOSIS — Z124 Encounter for screening for malignant neoplasm of cervix: Secondary | ICD-10-CM

## 2010-11-16 DIAGNOSIS — E119 Type 2 diabetes mellitus without complications: Secondary | ICD-10-CM

## 2010-11-16 DIAGNOSIS — N760 Acute vaginitis: Secondary | ICD-10-CM

## 2010-11-16 DIAGNOSIS — G43909 Migraine, unspecified, not intractable, without status migrainosus: Secondary | ICD-10-CM

## 2010-11-16 DIAGNOSIS — Z2089 Contact with and (suspected) exposure to other communicable diseases: Secondary | ICD-10-CM

## 2010-11-16 DIAGNOSIS — Z803 Family history of malignant neoplasm of breast: Secondary | ICD-10-CM

## 2010-11-16 LAB — CBC
HCT: 36 % (ref 36.0–46.0)
Hemoglobin: 11.6 g/dL — ABNORMAL LOW (ref 12.0–15.0)
MCH: 25 pg — ABNORMAL LOW (ref 26.0–34.0)
MCHC: 32.2 g/dL (ref 30.0–36.0)
MCV: 77.6 fL — ABNORMAL LOW (ref 78.0–100.0)
Platelets: 339 10*3/uL (ref 150–400)
RBC: 4.64 MIL/uL (ref 3.87–5.11)
RDW: 15.8 % — ABNORMAL HIGH (ref 11.5–15.5)
WBC: 6.5 10*3/uL (ref 4.0–10.5)

## 2010-11-16 LAB — POCT WET PREP (WET MOUNT): Trichomonas Wet Prep HPF POC: NEGATIVE

## 2010-11-16 LAB — POCT GLYCOSYLATED HEMOGLOBIN (HGB A1C): Hemoglobin A1C: 6.6

## 2010-11-16 LAB — LDL CHOLESTEROL, DIRECT: Direct LDL: 57 mg/dL

## 2010-11-16 MED ORDER — ESOMEPRAZOLE MAGNESIUM 40 MG PO CPDR: 40.0000 mg | DELAYED_RELEASE_CAPSULE | Freq: Every day | ORAL | Status: DC

## 2010-11-16 MED ORDER — TOPIRAMATE 25 MG PO TABS: 25.0000 mg | ORAL_TABLET | Freq: Three times a day (TID) | ORAL | Status: DC

## 2010-11-16 MED ORDER — METFORMIN HCL 500 MG PO TABS: 500.0000 mg | ORAL_TABLET | Freq: Two times a day (BID) | ORAL | Status: DC

## 2010-11-16 MED ORDER — RIZATRIPTAN BENZOATE 10 MG PO TABS
10.0000 mg | ORAL_TABLET | ORAL | Status: DC
Start: 2010-11-16 — End: 2011-04-18

## 2010-11-16 MED ORDER — LISINOPRIL 5 MG PO TABS: 2.5000 mg | ORAL_TABLET | Freq: Every day | ORAL | Status: DC

## 2010-11-16 NOTE — Patient Instructions (Addendum)
Nice to meet you. I will send your prescriptions to [facility]. I will call you if your labs are abnormal. Try to take your diabetes medication twice daily. Call for follow up diabetes appointment in 4-6 months.  Diabetes and Exercise Regular exercise is important and can help:   Control blood glucose (sugar).   Decrease blood pressure.   Control blood lipids (cholesterol and triglycerides).   Improve overall health.  BENEFITS FROM EXERCISE:  Improved fitness.   Improved flexibility.   Improved endurance.   Increased bone density.   Weight control.   Increased muscle strength.   Decreased body fat.   Improvement of the body's use of a hormone called insulin.   Increased insulin sensitivity.   Reduction of insulin needs.   Helps you feel better.   Reduces stress and tension.  People with diabetes who add exercise to their lifestyle gain additional benefits.   Weight loss.   Reduces appetite.   Improves body's use of blood glucose (sugar).   Decreases risk factors for heart disease:   Lowering of cholesterol and triglycerides.   Raising the level of good cholesterol (high-density lipoproteins [HDL]).   Lowering blood sugar.   Decreases blood pressure.  TYPE 1 DIABETES AND EXERCISE  Exercise will usually lower your blood glucose.   If blood glucose is greater than 240 mg/dl, check urine ketones. If ketones are present, do not exercise.   Location of the insulin injection sites may need to be adjusted with exercise. Avoid injecting insulin into areas of the body that will be exercised. For example, avoid injecting insulin into:   The arms when playing tennis.   The legs when jogging. For more information, discuss this with your caregiver.   Keep a record of:   Food intake.   Type and amount of exercise.   Expected peak times of insulin action.   Blood glucose (sugar) levels.  Do this before, during and after exercise. Review your records with  your caregiver(s). This will help you to develop guidelines for adjusting food intake and/or insulin amounts.  TYPE 2 DIABETES AND EXERCISE  Regular physical activity can help control blood glucose.   Exercise is important because it may:   Increase the body's sensitivity to insulin.   Improve blood glucose control.   Exercise reduces the risk of heart disease. It decreases serum cholesterol and triglycerides. It also lowers blood pressure.   Those who take insulin or oral hypoglycemic agents should watch for signs of hypoglycemia. These signs include dizziness, shaking, sweating, chills and confusion.   Body water is lost during exercise. It must be replaced. This will help to avoid loss of body fluids (dehydration) and/or heat stroke.  Be sure to talk to your caregiver before starting an exercise program to make sure it is safe for you. Remember, any activity is better than none.  Document Released: 08/31/2003 Document Re-Released: 04/07/2009 [facility] Patient Information 2011 [place], [place].

## 2010-11-17 LAB — BASIC METABOLIC PANEL
BUN: 14 mg/dL (ref 6–23)
CO2: 23 mEq/L (ref 19–32)
Calcium: 9.5 mg/dL (ref 8.4–10.5)
Chloride: 105 mEq/L (ref 96–112)
Creat: 0.93 mg/dL (ref 0.40–1.20)
Glucose, Bld: 90 mg/dL (ref 70–99)
Potassium: 4 mEq/L (ref 3.5–5.3)
Sodium: 142 mEq/L (ref 135–145)

## 2010-11-17 LAB — TSH: TSH: 0.02 u[IU]/mL — ABNORMAL LOW (ref 0.350–4.500)

## 2010-11-17 LAB — GC/CHLAMYDIA PROBE AMP, GENITAL
Chlamydia, DNA Probe: NEGATIVE
GC Probe Amp, Genital: NEGATIVE

## 2010-11-20 ENCOUNTER — Encounter: Payer: Self-pay | Admitting: Family Medicine

## 2010-11-20 DIAGNOSIS — Z803 Family history of malignant neoplasm of breast: Secondary | ICD-10-CM | POA: Insufficient documentation

## 2010-11-20 LAB — T4, FREE: Free T4: 1.39 ng/dL (ref 0.80–1.80)

## 2010-11-20 LAB — T3, FREE: T3, Free: 3.8 pg/mL (ref 2.3–4.2)

## 2010-11-20 NOTE — Assessment & Plan Note (Signed)
Hypoechoic nodule found 09/2009. Recommended follow up exam. Will check TSH and free hormones for surveillance.

## 2010-11-20 NOTE — Progress Notes (Signed)
  Subjective:    Patient ID: Kendra Hall, female    DOB: 15-Apr-1966, 45 y.o.   MRN: [id number]  HPI 1. Diabetes. Glucometer is broken. Taking metformin inconsistently due to GI side effects. Takes lisinopril inconsistently due to feeling of "sickeness" afterwards. No foot ulcers or numbness/tingling. No visual changes. Hopefull that A1c is improved due to her increased compliance.   2. Health maintenance. Desires to be screened for STDs after new relationship for 2 months. Denies discharge but did have transient "pain" after intercourse. Using condoms inconsistently. Hx of trichomonas. Regular menstrual periods. Had BTL after second child.  3. Back pain. Using flexeril and percocet prn. Denies daily use.   4. Hx of thyroid abnormality. Nodule on [place] in 09/2009. Hormones normal previously. No symptoms of hot/cold insensitivity. Lost few pounds. No palpitations, dyspnea.    Review of Systems See HPI. All other systems negative.    Objective:   Physical Exam  Vitals reviewed. Constitutional: She is oriented to person, place, and time. She appears well-developed and well-nourished. No distress.  HENT:  Head: Normocephalic and atraumatic.  Eyes: EOM are normal. Pupils are equal, round, and reactive to light.  Neck: Neck supple. Thyromegaly present.       Nontender thyromegaly  Cardiovascular: Normal rate, regular rhythm and normal heart sounds.  Exam reveals no gallop.   No murmur heard. Pulmonary/Chest: Effort normal.  Genitourinary: Vagina normal. No vaginal discharge found.  Musculoskeletal: She exhibits no edema and no tenderness.       No pedal ulcers. Sensation intact with filament testing bilaterally. No toenail abnormalities  Lymphadenopathy:    She has no cervical adenopathy.  Neurological: She is alert and oriented to person, place, and time. No cranial nerve deficit.  Skin: She is not diaphoretic.  Psychiatric: She has a normal mood and affect.          Assessment &  Plan:

## 2010-11-20 NOTE — Assessment & Plan Note (Signed)
Patient had negative screening in past. No palpable breast masses currently according to patient. Will screen yearly and ordered today.

## 2010-11-20 NOTE — Assessment & Plan Note (Signed)
Controlled adequately on topamax. PRN maxalt used infrequently. WIll continue current management.

## 2010-11-20 NOTE — Assessment & Plan Note (Signed)
Screening GC/chl and wet prep with exposure to new sexual contact. Recommended routine condom use for STD protection.

## 2010-11-20 NOTE — Progress Notes (Signed)
Addended by: Durwin Reges on: 11/20/2010 10:30 AM   Modules accepted: Orders, Level of Service

## 2010-11-20 NOTE — Assessment & Plan Note (Signed)
Improved control today with A1c 6.6 from 8.4. Will continue metformin 500 BID. Patient refuses change to XR form which might help her GI symptoms.Also agrees to taking half tablet lisinopril if side effects tolerable. Onetouch glucometer given today with instructions to check CBG daily prn.

## 2010-12-05 ENCOUNTER — Telehealth: Payer: Self-pay | Admitting: Family Medicine

## 2010-12-05 NOTE — Telephone Encounter (Signed)
Needs refill on Percocet, please call when it is ready.

## 2010-12-12 ENCOUNTER — Other Ambulatory Visit: Payer: Self-pay | Admitting: Family Medicine

## 2010-12-12 MED ORDER — OXYCODONE-ACETAMINOPHEN 5-325 MG PO TABS: 1.0000 | ORAL_TABLET | Freq: Two times a day (BID) | ORAL | Status: DC

## 2010-12-12 NOTE — Telephone Encounter (Signed)
Need refill on percocet.  Refill request put in last week.  Pt only have 1 tablet left.

## 2010-12-12 NOTE — Telephone Encounter (Signed)
Paged Dr. Cristal Ford. States she will do today advised patient to plan to pick up today at 4:00 PM..

## 2011-01-14 ENCOUNTER — Encounter: Payer: Self-pay | Admitting: Family Medicine

## 2011-03-01 ENCOUNTER — Encounter: Payer: Self-pay | Admitting: Family Medicine

## 2011-03-01 ENCOUNTER — Ambulatory Visit (INDEPENDENT_AMBULATORY_CARE_PROVIDER_SITE_OTHER): Payer: Medicare Other | Admitting: Family Medicine

## 2011-03-01 VITALS — BP 116/75 | HR 87 | Temp 98.5°F | Wt 189.0 lb

## 2011-03-01 DIAGNOSIS — M545 Low back pain, unspecified: Secondary | ICD-10-CM

## 2011-03-01 DIAGNOSIS — J069 Acute upper respiratory infection, unspecified: Secondary | ICD-10-CM

## 2011-03-01 MED ORDER — DOXYCYCLINE HYCLATE 100 MG PO TABS: 100.0000 mg | ORAL_TABLET | Freq: Two times a day (BID) | ORAL | Status: DC

## 2011-03-01 MED ORDER — KETOROLAC TROMETHAMINE 30 MG/ML IJ SOLN
30.0000 mg | Freq: Once | INTRAMUSCULAR | Status: AC
  Administered 2011-03-01: 30 mg via INTRAMUSCULAR

## 2011-03-01 MED ORDER — OXYCODONE-ACETAMINOPHEN 5-325 MG PO TABS: 1.0000 | ORAL_TABLET | Freq: Two times a day (BID) | ORAL | Status: DC

## 2011-03-01 NOTE — Assessment & Plan Note (Signed)
Shouldn't has chronic low back pain and her exam and history are consistent with that today. We discussed that prescription pain medicine should be managed by her PCP alone. However she is unable to get in with Dr. Cristal Ford  Currently.   I will prescribe 10 Percocets and instructions to follow up with PCP.

## 2011-03-01 NOTE — Patient Instructions (Signed)
Thank you for coming in today. Continue antibiotics of doxycycline.  Did not fill this medicine until Monday.  If you are still  sick on Monday or  get worse  please fill this and take it twice a day for a week. I am also refilling your Percocet with 10 pills.  You need to see your regular doctor for further refills of this medication. Please make an appointment with her in one month.

## 2011-03-01 NOTE — Progress Notes (Signed)
Ms. Benedict presents with sinus congestion x2-3 days.  Her symptoms started with a fever to 102 aching pain stuffiness thick white nasal discharge.  They have been persistent. She's tried taking Benadryl which has not helped much. She denies any sick contacts. She notes that she does have seasonal allergies and continues to smoke. She also notes sinus pressure and requests antibiotics.  Her next issue is her chronic back pain.  She takes oxycodone very intermittently.  She has run out and is using some for the aches and pains that she has with this illness.  She requests a refill of Percocet.  PMH reviewed.  ROS as above otherwise neg  Exam:  BP 116/75  Pulse 87  Temp(Src) 98.5 F (36.9 C) (Oral)  Wt 189 lb (85.73 kg) Gen: Well NAD HEENT: EOMI,  MMM, red turbinates bilaterally. White nasal discharge bilaterally. Pain on palpation of the maxillary sinuses left greater than right.   Ears are normal appearing bilaterally.  Throat shows cobblestoning in the posterior pharynx but is otherwise relatively normal. Lungs: CTABL Nl WOB Heart: RRR no MRG Abd: NABS, NT, ND Exts: Non edematous BL  LE, warm and well perfused.  MSK: No midline point tenderness along the entire spine. Right-sided paraspinal lumbar tenderness to palpation. Patient has a normal gait and is able to step up and down on the exam table by her some. She has intact sensation in her bilateral lower extremity.

## 2011-03-01 NOTE — Assessment & Plan Note (Signed)
I suspect Kendra Hall symptoms are likely do to either allergies or a viral URI. However she does have one side to the worse sinus pain and a fever of 102 per her. We had a five-minute discussion of the risks versus benefits of antibiotics. Plan to use symptomatic treatment for the first few days. However I did write a doxycycline prescription to be filled on Monday if she is worse or not improving. Patient expresses understanding.

## 2011-03-04 ENCOUNTER — Emergency Department (HOSPITAL_COMMUNITY): Payer: Medicare Other

## 2011-03-04 ENCOUNTER — Emergency Department (HOSPITAL_COMMUNITY)
Admission: EM | Admit: 2011-03-04 | Discharge: 2011-03-05 | Disposition: A | Discharge status: Home / Self Care | Payer: Medicare Other | Attending: Emergency Medicine | Admitting: Emergency Medicine

## 2011-03-04 DIAGNOSIS — R05 Cough: Secondary | ICD-10-CM | POA: Insufficient documentation

## 2011-03-04 DIAGNOSIS — J45909 Unspecified asthma, uncomplicated: Secondary | ICD-10-CM | POA: Insufficient documentation

## 2011-03-04 DIAGNOSIS — R059 Cough, unspecified: Secondary | ICD-10-CM | POA: Insufficient documentation

## 2011-03-04 DIAGNOSIS — J3489 Other specified disorders of nose and nasal sinuses: Secondary | ICD-10-CM | POA: Insufficient documentation

## 2011-03-04 DIAGNOSIS — R0602 Shortness of breath: Secondary | ICD-10-CM | POA: Insufficient documentation

## 2011-03-04 DIAGNOSIS — R109 Unspecified abdominal pain: Secondary | ICD-10-CM | POA: Insufficient documentation

## 2011-03-04 DIAGNOSIS — E119 Type 2 diabetes mellitus without complications: Secondary | ICD-10-CM | POA: Insufficient documentation

## 2011-03-04 DIAGNOSIS — J069 Acute upper respiratory infection, unspecified: Secondary | ICD-10-CM | POA: Insufficient documentation

## 2011-03-04 LAB — URINALYSIS, ROUTINE W REFLEX MICROSCOPIC
Bilirubin Urine: NEGATIVE
Glucose, UA: 250 mg/dL — AB
Hgb urine dipstick: NEGATIVE
Ketones, ur: NEGATIVE mg/dL
Leukocytes, UA: NEGATIVE
Nitrite: NEGATIVE
Protein, ur: NEGATIVE mg/dL
Specific Gravity, Urine: 1.027 (ref 1.005–1.030)
Urobilinogen, UA: 1 mg/dL (ref 0.0–1.0)
pH: 7 (ref 5.0–8.0)

## 2011-03-04 LAB — POCT PREGNANCY, URINE: Preg Test, Ur: NEGATIVE

## 2011-03-26 LAB — DIFFERENTIAL
Basophils Absolute: 0.1
Basophils Relative: 1
Eosinophils Absolute: 0.2
Eosinophils Relative: 2
Lymphocytes Relative: 40
Lymphs Abs: 3.5
Monocytes Absolute: 0.6
Monocytes Relative: 7
Neutro Abs: 4.4
Neutrophils Relative %: 50

## 2011-03-26 LAB — COMPREHENSIVE METABOLIC PANEL
ALT: 15
AST: 18
Albumin: 3.3 — ABNORMAL LOW
Alkaline Phosphatase: 100
BUN: 11
CO2: 26
Calcium: 8.9
Chloride: 108
Creatinine, Ser: 0.78
GFR calc Af Amer: >60
GFR calc non Af Amer: >60
Glucose, Bld: 149 — ABNORMAL HIGH
Potassium: 3.6
Sodium: 138
Total Bilirubin: 0.3
Total Protein: 6.4

## 2011-03-26 LAB — URINALYSIS, ROUTINE W REFLEX MICROSCOPIC
Bilirubin Urine: NEGATIVE
Glucose, UA: NEGATIVE
Hgb urine dipstick: NEGATIVE
Ketones, ur: NEGATIVE
Nitrite: NEGATIVE
Protein, ur: NEGATIVE
Specific Gravity, Urine: 1.026
Urobilinogen, UA: 1
pH: 7

## 2011-03-26 LAB — URINE MICROSCOPIC-ADD ON

## 2011-03-26 LAB — CBC
HCT: 35 — ABNORMAL LOW
Hemoglobin: 11.3 — ABNORMAL LOW
MCHC: 32.4
MCV: 80
Platelets: 312
RBC: 4.37
RDW: 17.3 — ABNORMAL HIGH
WBC: 8.8

## 2011-03-26 LAB — LIPASE, BLOOD: Lipase: 44

## 2011-04-08 ENCOUNTER — Encounter: Payer: Self-pay | Admitting: Family Medicine

## 2011-04-08 ENCOUNTER — Ambulatory Visit (INDEPENDENT_AMBULATORY_CARE_PROVIDER_SITE_OTHER): Payer: Medicare Other | Admitting: Family Medicine

## 2011-04-08 VITALS — BP 118/72 | HR 89 | Temp 98.3°F | Ht 64.0 in | Wt 190.0 lb

## 2011-04-08 DIAGNOSIS — M545 Low back pain, unspecified: Secondary | ICD-10-CM

## 2011-04-08 DIAGNOSIS — M25519 Pain in unspecified shoulder: Secondary | ICD-10-CM

## 2011-04-08 DIAGNOSIS — E049 Nontoxic goiter, unspecified: Secondary | ICD-10-CM

## 2011-04-08 LAB — T4, FREE: Free T4: 2.31 ng/dL — ABNORMAL HIGH (ref 0.80–1.80)

## 2011-04-08 LAB — TSH: TSH: <0.008 u[IU]/mL — ABNORMAL LOW (ref 0.350–4.500)

## 2011-04-08 MED ORDER — ALBUTEROL SULFATE HFA 108 (90 BASE) MCG/ACT IN AERS: 2.0000 | INHALATION_SPRAY | RESPIRATORY_TRACT | Status: DC | PRN

## 2011-04-08 MED ORDER — MELOXICAM 15 MG PO TABS: 7.5000 mg | ORAL_TABLET | Freq: Every day | ORAL | Status: DC

## 2011-04-08 MED ORDER — OXYCODONE-ACETAMINOPHEN 5-325 MG PO TABS: 1.0000 | ORAL_TABLET | Freq: Two times a day (BID) | ORAL | Status: DC

## 2011-04-08 NOTE — Assessment & Plan Note (Signed)
Will order a free T4 and TSH today in order to evaluate patient's complaints of a 20 pound weight loss in the past month.  This amount of weight loss is not supported by the medical record.  Patient does have thyromegaly on exam today and on review of ultrasound performed on April 2011, and they recommended 6 month followup of hypoechoic regions.it appears that patient's PCP has also ordered as followup for her the patient is unable to tell me today why This imaging was canceled.  I advised her that I will call her if lab results are abnormal otherwise she is to keep her follow-up with her PCP and to discuss further need for ultrasound at that time.

## 2011-04-08 NOTE — Patient Instructions (Signed)
Please keep follow-up appt with Dr. Cristal Ford I will draw your thyroid labs today- I will send you a letter if all labs are normal, I will call you if abnormal

## 2011-04-08 NOTE — Assessment & Plan Note (Signed)
Patient does not seem interested today in pursuing a diagnosis or plan for physical therapy to help improve long-term function and pain control. I agreed to start meloxicam and get a limited supply of Percocet quantity #10 to get patient to her followup appointment with her PCP later this month. I discussed with her my concern that her prescriptions for Percocet are becoming more frequent than that it is important to discuss with her primary care provider a long-term goal for managing pain.

## 2011-04-08 NOTE — Progress Notes (Signed)
  Subjective:    Patient ID: Kendra Hall, female    DOB: [birth year], 45 y.o.   MRN: [id number]  HPI Patient is a 45 year old female who  presents for a work in appointment for chronic right shoulder and low back pain as well as for evaluation of a 20 pound weight loss in the past month she feels is due to her history of thyroid disease.  Chronic pain: Patient states she has a long history of right shoulder pain due to bursitis which has been previously evaluated by Dr. Jorge Mandril, sports medicine. In addition she states she has chronic low back pain which is managed with occasional Percocet. She states many months she does not require any pain medication. Her last fill was 4 Percocet quantity #10 by Dr. Denyse Amass in a work in appointment  on September 7.  She became more frustrated as I ask her more questions about her chronic pain.  she states there has been no acute injury or exacerbation of her pain.  She states she has not pursued other treatment such as physical therapy due to financial reasons. Review of Systems No fever, chills, nausea, vomiting, diarrhea, palpitations. No numbness,tingling, weakness, change inbowel or bladder habits.    Objective:   Physical Exam GEN: Alert & Oriented, No acute distress talking on her cell phone. Neck:  Thyromegaly. CV:  Regular Rate & Rhythm, no murmur Respiratory:  Normal work of breathing, CTAB Ext: no pre-tibial edema.   MSK:  Normal gait.  Not currenlty having back pain today, unable to elicit pain on palpation or range of motion.  Would not cooperate with shoulder exam due to pain.       Assessment & Plan:

## 2011-04-08 NOTE — Progress Notes (Signed)
Addended by: Macy Mis on: 04/08/2011 05:04 PM   Modules accepted: Orders

## 2011-04-10 ENCOUNTER — Telehealth: Payer: Self-pay | Admitting: Family Medicine

## 2011-04-10 ENCOUNTER — Encounter: Payer: Self-pay | Admitting: Family Medicine

## 2011-04-10 DIAGNOSIS — E0590 Thyrotoxicosis, unspecified without thyrotoxic crisis or storm: Secondary | ICD-10-CM

## 2011-04-10 NOTE — Telephone Encounter (Signed)
Addended by: Macy Mis on: 04/10/2011 10:27 AM   Modules accepted: Orders

## 2011-04-10 NOTE — Telephone Encounter (Signed)
Unable to reach patient. Will send letter.

## 2011-04-10 NOTE — Telephone Encounter (Signed)
Spoke with patient- will schedule thyroid uptake scan- her preference is next week.  Will also schedule follow-up appointment with her PCp after date of test to discuss results and treatment options.

## 2011-04-10 NOTE — Telephone Encounter (Signed)
Addended by: Macy Mis on: 04/10/2011 11:08 AM   Modules accepted: Orders

## 2011-04-18 ENCOUNTER — Encounter: Payer: Self-pay | Admitting: Family Medicine

## 2011-04-18 ENCOUNTER — Ambulatory Visit (INDEPENDENT_AMBULATORY_CARE_PROVIDER_SITE_OTHER): Payer: Medicare Other | Admitting: Family Medicine

## 2011-04-18 VITALS — BP 132/75 | HR 91 | Temp 98.1°F | Ht 63.0 in | Wt 187.0 lb

## 2011-04-18 DIAGNOSIS — Z2089 Contact with and (suspected) exposure to other communicable diseases: Secondary | ICD-10-CM

## 2011-04-18 DIAGNOSIS — E049 Nontoxic goiter, unspecified: Secondary | ICD-10-CM

## 2011-04-18 DIAGNOSIS — E119 Type 2 diabetes mellitus without complications: Secondary | ICD-10-CM

## 2011-04-18 DIAGNOSIS — R5381 Other malaise: Secondary | ICD-10-CM

## 2011-04-18 DIAGNOSIS — M545 Low back pain, unspecified: Secondary | ICD-10-CM

## 2011-04-18 DIAGNOSIS — R070 Pain in throat: Secondary | ICD-10-CM

## 2011-04-18 DIAGNOSIS — G43909 Migraine, unspecified, not intractable, without status migrainosus: Secondary | ICD-10-CM

## 2011-04-18 DIAGNOSIS — R5383 Other fatigue: Secondary | ICD-10-CM

## 2011-04-18 DIAGNOSIS — E0590 Thyrotoxicosis, unspecified without thyrotoxic crisis or storm: Secondary | ICD-10-CM

## 2011-04-18 LAB — POCT GLYCOSYLATED HEMOGLOBIN (HGB A1C): Hemoglobin A1C: 7.3

## 2011-04-18 MED ORDER — TRAMADOL HCL 50 MG PO TABS: 50.0000 mg | ORAL_TABLET | Freq: Four times a day (QID) | ORAL | Status: AC | PRN

## 2011-04-18 MED ORDER — RIZATRIPTAN BENZOATE 10 MG PO TBDP: 10.0000 mg | ORAL_TABLET | ORAL | Status: DC | PRN

## 2011-04-18 MED ORDER — OXYCODONE-ACETAMINOPHEN 5-325 MG PO TABS: 1.0000 | ORAL_TABLET | Freq: Two times a day (BID) | ORAL | Status: DC

## 2011-04-18 MED ORDER — TRAMADOL HCL 50 MG PO TABS: 50.0000 mg | ORAL_TABLET | Freq: Four times a day (QID) | ORAL | Status: DC | PRN

## 2011-04-18 NOTE — Patient Instructions (Signed)
I will call to talk about thyroid scan and results.  I have refilled you medicines. Try to reserve percocet for severe pain only and use tramadol if you can instead.

## 2011-04-19 LAB — HIV ANTIBODY (ROUTINE TESTING W REFLEX): HIV: NONREACTIVE

## 2011-04-20 ENCOUNTER — Encounter: Payer: Self-pay | Admitting: Family Medicine

## 2011-04-20 NOTE — Assessment & Plan Note (Signed)
Gave new script for ODT form of maxalt. Continue prophylactic topamax.

## 2011-04-20 NOTE — Assessment & Plan Note (Addendum)
Have refilled percocet since patient uses sparingly. She has not tried flexeril, so will discontinue. Discussed avoidance of long term dependence and encouraged to use prn tramadol on days when her pain isn't as severe. Will continue to address patient's expectations of pain control and risks of opiate misuse at follow up visits.

## 2011-04-20 NOTE — Assessment & Plan Note (Addendum)
Patient is noncompliant with metformin, but believes her A1c will be controlled because she has been active and watching what she eats. Will check A1c today. Recent weight loss may contribute to improvement, but have recommended she continue metformin as not likely to have resolved her diabetes. Continue low dose ACEi. Will follow up with a dedicated diabetes check up after thyroid issue is controlled.

## 2011-04-20 NOTE — Assessment & Plan Note (Signed)
No signs of hemodynamic effect or thyroid storm. Patient to have iodine uptake next week. Suspect possible recurrence of Grave's disease. Will follow up results for additional workup as indicated.
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
## 2011-04-22 ENCOUNTER — Other Ambulatory Visit: Payer: Self-pay | Admitting: Family Medicine

## 2011-04-22 ENCOUNTER — Encounter (HOSPITAL_COMMUNITY)
Admission: RE | Admit: 2011-04-22 | Discharge: 2011-04-22 | Disposition: A | Discharge status: Home / Self Care | Payer: Medicare Other | Source: Ambulatory Visit | Attending: Family Medicine | Admitting: Family Medicine

## 2011-04-22 DIAGNOSIS — E0590 Thyrotoxicosis, unspecified without thyrotoxic crisis or storm: Secondary | ICD-10-CM | POA: Insufficient documentation

## 2011-04-22 MED ORDER — SODIUM IODIDE I 131 CAPSULE
8.0000 | Freq: Once | INTRAVENOUS | Status: AC | PRN
  Administered 2011-04-22: 8.6 via ORAL

## 2011-04-22 NOTE — Telephone Encounter (Signed)
Pt came by the office to pick up copies of her work/school notes since she lost them, said she had asked Huntley Dec (CMA) about getting a refill on her xanax, pt would like call back.

## 2011-04-22 NOTE — Telephone Encounter (Signed)
Spoke with patient and informed her that she will have to make an appointment before rx can be refilled.

## 2011-04-23 ENCOUNTER — Encounter (HOSPITAL_COMMUNITY)
Admission: RE | Admit: 2011-04-23 | Discharge: 2011-04-23 | Disposition: A | Discharge status: Home / Self Care | Payer: Medicare Other | Source: Ambulatory Visit | Attending: Family Medicine | Admitting: Family Medicine

## 2011-04-23 DIAGNOSIS — E0590 Thyrotoxicosis, unspecified without thyrotoxic crisis or storm: Secondary | ICD-10-CM | POA: Insufficient documentation

## 2011-04-23 MED ORDER — SODIUM PERTECHNETATE TC 99M INJECTION
10.0000 | Freq: Once | INTRAVENOUS | Status: AC | PRN
  Administered 2011-04-23: 10 via INTRAVENOUS

## 2011-04-23 MED ORDER — SODIUM IODIDE I 131 CAPSULE: 8.6000 | Freq: Once | INTRAVENOUS | Status: AC | PRN

## 2011-04-24 ENCOUNTER — Telehealth: Payer: Self-pay | Admitting: Family Medicine

## 2011-04-24 NOTE — Telephone Encounter (Signed)
Called to discuss lab tests. Diabetes under worse control due to medication noncompliance. Advise to restart metformin. Also need to discuss treatment options for Grave's disease. Likely would not be a medical candidate due to history of medication noncompliance. I would recommend radioiodine therapy vs surgery. She will make an appointment next week. No hemodynamic signs of tachycardia, HTN currently.  Warned of signs of thyroid storm (palpitations, blurry vision, chest pain) to seek emergency care in the meantime. She requests a refill of xanax she takes for panic attacks as she is out and had 2 x panic attacks this week. Agreed to leave at front desk tomorrow. [employer]ATP.

## 2011-04-25 ENCOUNTER — Other Ambulatory Visit: Payer: Self-pay | Admitting: Family Medicine

## 2011-04-25 MED ORDER — ALPRAZOLAM 0.5 MG PO TABS: 0.5000 mg | ORAL_TABLET | Freq: Three times a day (TID) | ORAL | Status: DC | PRN

## 2011-05-07 ENCOUNTER — Encounter: Payer: Self-pay | Admitting: Family Medicine

## 2011-05-07 ENCOUNTER — Ambulatory Visit (INDEPENDENT_AMBULATORY_CARE_PROVIDER_SITE_OTHER): Payer: Medicare Other | Admitting: Family Medicine

## 2011-05-07 VITALS — BP 123/71 | HR 90 | Ht 63.0 in | Wt 188.0 lb

## 2011-05-07 DIAGNOSIS — M545 Low back pain, unspecified: Secondary | ICD-10-CM

## 2011-05-07 DIAGNOSIS — E0500 Thyrotoxicosis with diffuse goiter without thyrotoxic crisis or storm: Secondary | ICD-10-CM | POA: Insufficient documentation

## 2011-05-07 MED ORDER — ALPRAZOLAM 0.5 MG PO TABS: 0.5000 mg | ORAL_TABLET | Freq: Three times a day (TID) | ORAL | Status: DC | PRN

## 2011-05-07 MED ORDER — ALBUTEROL SULFATE HFA 108 (90 BASE) MCG/ACT IN AERS: 2.0000 | INHALATION_SPRAY | RESPIRATORY_TRACT | Status: DC | PRN

## 2011-05-07 MED ORDER — OXYCODONE-ACETAMINOPHEN 5-325 MG PO TABS: 1.0000 | ORAL_TABLET | Freq: Two times a day (BID) | ORAL | Status: DC

## 2011-05-07 MED ORDER — PROPRANOLOL HCL 20 MG PO TABS: 20.0000 mg | ORAL_TABLET | Freq: Three times a day (TID) | ORAL | Status: DC

## 2011-05-07 NOTE — Patient Instructions (Signed)
Start taking inderal three times daily for racing heartbeat and anxiety. I will call the radiolgist to contact you about thyroid treatment. Follow up in next 2-3 months after treatment.

## 2011-05-08 ENCOUNTER — Encounter: Payer: Self-pay | Admitting: *Deleted

## 2011-05-08 NOTE — Assessment & Plan Note (Addendum)
Discussed treatment options in detail. 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 WIll start low dose propranolol to avoid palpitations and hypertension, anxiety. Will refer to nuclear medicine for treatment in mid-December. I have refilled valium for short term for panic attacks, anxiety but have discussed avoidance of this medication in the long term. Patient agrees to plan.   I spent 30 minutes with patient, >50% of this discussing treatment and precautions, coordinating care.

## 2011-05-08 NOTE — Progress Notes (Signed)
  Subjective:    Patient ID: Kendra Hall, female    DOB: 25-Sep-1965, 45 y.o.   MRN: [id number]  HPI  1. Hyperthyroidism. Presented initially with weight loss over several months and elevated Free T4, TSH <0.008. Iodine uptake scan shows diffuse increased uptake of 61.5% c/w Graves disease. Pt has a history of Graves treated with one year of anti-thyroid medication while she was in [place] 6 years ago. She says today that weight has stabilized, but still having nightly palpitations and has been having panic attacks every other day. Takes valium for these attacks that does help.  Has been considering treatment for Graves as we discussed options over the phone previously. Would avoid surgical risks, asks about repeating the oral medication, but we agree this has already failed once. 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  Review of Systems Denies visual changes, edema, chest pain, skin changes. Endorses some feeling of fullness in the neck and intermittent tenderness.    Objective:   Physical Exam  Vitals reviewed. Constitutional: She is oriented to person, place, and time. She appears well-developed and well-nourished. No distress.  HENT:  Head: Normocephalic and atraumatic.  Neck: Neck supple. Thyromegaly present.       R>L thyromegaly, no nodules palpated  Cardiovascular: Normal rate, regular rhythm and normal heart sounds.   No murmur heard. Pulmonary/Chest: Effort normal and breath sounds normal. No respiratory distress. She has no wheezes. She has no rales.  Musculoskeletal: She exhibits no edema.  Neurological: She is alert and oriented to person, place, and time. Coordination normal.  Psychiatric: She has a normal mood and affect.          Assessment & Plan:

## 2011-05-24 ENCOUNTER — Ambulatory Visit: Payer: Medicare Other | Admitting: Family Medicine

## 2011-05-24 ENCOUNTER — Telehealth: Payer: Self-pay | Admitting: *Deleted

## 2011-05-24 ENCOUNTER — Ambulatory Visit (INDEPENDENT_AMBULATORY_CARE_PROVIDER_SITE_OTHER): Payer: Medicare Other | Admitting: Family Medicine

## 2011-05-24 VITALS — BP 144/83 | HR 102 | Temp 98.5°F | Ht 63.0 in | Wt 187.0 lb

## 2011-05-24 DIAGNOSIS — N6311 Unspecified lump in the right breast, upper outer quadrant: Secondary | ICD-10-CM | POA: Insufficient documentation

## 2011-05-24 DIAGNOSIS — N63 Unspecified lump in unspecified breast: Secondary | ICD-10-CM

## 2011-05-24 NOTE — Telephone Encounter (Signed)
[facility] to [facility]. Pt said, that she had a mammogram done within the last 2 years. There is an order in chart, but mammogram was not done at [facility], [facility]. I asked the pt to find out, where she had it done. Also asked her to call [facility], maybe she had it done at [facility] or [facility], which is [facility] now. Pt insisted of having the mammogram done at [facility].  We need previous mammogram, because it will be a diagnostic mammogram. Waiting for pt to call back with the information. Lorenda Hatchet, Renato Battles

## 2011-05-26 NOTE — Assessment & Plan Note (Signed)
Small freely mobile lump in upper right quadrant of R breast.  I would favor cystic over solid mass, however given hx of breast ca in 1st degree relative, will send for diagnostic mammography.

## 2011-05-26 NOTE — Progress Notes (Signed)
  Subjective:    Patient ID: Kendra Hall, female    DOB: 02-Jun-1966, 45 y.o.   MRN: [id number]  HPI 1.  ?Lump in breast:  Comes in today stating she thinks she has found a lump in her breast.  She was in bed last night and noticed the area in her R upper breast.  Mildly painful to touch.  Is very concerned because she had a sister with breast cancer who died at age 66.  She has had some increased undesired weight loss of around 20 lbs over the past 6 months, but she was also diagnosed with Grave's disease as well.  She says her last mammogram was 2 years ago and was normal.     Review of Systems     Objective:   Physical Exam  Constitutional: She appears well-developed and well-nourished.  Pulmonary/Chest: Right breast exhibits tenderness. Right breast exhibits no inverted nipple, no nipple discharge and no skin change. Left breast exhibits no inverted nipple, no nipple discharge, no skin change and no tenderness.    Psychiatric: Her mood appears anxious.          Assessment & Plan:

## 2011-05-27 ENCOUNTER — Other Ambulatory Visit: Payer: Self-pay | Admitting: Family Medicine

## 2011-05-27 ENCOUNTER — Ambulatory Visit: Payer: Medicare Other | Admitting: Family Medicine

## 2011-05-27 DIAGNOSIS — N6311 Unspecified lump in the right breast, upper outer quadrant: Secondary | ICD-10-CM

## 2011-05-27 NOTE — Telephone Encounter (Signed)
Pt had mammogram at [facility] and [facility] has scheduled appt for her 06/14/11 at 9:50 am. .Arlyss Repress

## 2011-06-03 ENCOUNTER — Ambulatory Visit: Payer: Medicare Other | Admitting: Family Medicine

## 2011-06-07 ENCOUNTER — Encounter: Payer: Self-pay | Admitting: Family Medicine

## 2011-06-07 ENCOUNTER — Ambulatory Visit (INDEPENDENT_AMBULATORY_CARE_PROVIDER_SITE_OTHER): Payer: Medicare Other | Admitting: Family Medicine

## 2011-06-07 VITALS — BP 132/77 | HR 93 | Temp 98.7°F | Ht 63.0 in | Wt 185.0 lb

## 2011-06-07 DIAGNOSIS — E0500 Thyrotoxicosis with diffuse goiter without thyrotoxic crisis or storm: Secondary | ICD-10-CM

## 2011-06-07 DIAGNOSIS — K219 Gastro-esophageal reflux disease without esophagitis: Secondary | ICD-10-CM | POA: Insufficient documentation

## 2011-06-07 DIAGNOSIS — E119 Type 2 diabetes mellitus without complications: Secondary | ICD-10-CM

## 2011-06-07 DIAGNOSIS — G43909 Migraine, unspecified, not intractable, without status migrainosus: Secondary | ICD-10-CM

## 2011-06-07 DIAGNOSIS — N6311 Unspecified lump in the right breast, upper outer quadrant: Secondary | ICD-10-CM

## 2011-06-07 DIAGNOSIS — M545 Low back pain, unspecified: Secondary | ICD-10-CM

## 2011-06-07 DIAGNOSIS — N63 Unspecified lump in unspecified breast: Secondary | ICD-10-CM

## 2011-06-07 MED ORDER — OXYCODONE-ACETAMINOPHEN 5-325 MG PO TABS: 1.0000 | ORAL_TABLET | Freq: Two times a day (BID) | ORAL | Status: DC

## 2011-06-07 MED ORDER — ESOMEPRAZOLE MAGNESIUM 40 MG PO CPDR: 40.0000 mg | DELAYED_RELEASE_CAPSULE | Freq: Every day | ORAL | Status: DC

## 2011-06-07 MED ORDER — ALPRAZOLAM 0.5 MG PO TABS: 0.5000 mg | ORAL_TABLET | Freq: Three times a day (TID) | ORAL | Status: DC | PRN

## 2011-06-07 MED ORDER — PROMETHAZINE HCL 25 MG PO TABS: 12.5000 mg | ORAL_TABLET | Freq: Three times a day (TID) | ORAL | Status: DC | PRN

## 2011-06-07 NOTE — Assessment & Plan Note (Signed)
Still noncompliant with metformin and lisinopril. Will reassess at next visit. May try sulfonylurea instead, as GI effects noted. Patient resistant to trying new meds despite lengthy discussions of health impact.

## 2011-06-07 NOTE — Patient Instructions (Signed)
Call nuclear medicine to schedule thyroid treatment. Take propranolol until your thyroid is treated. Will also help prevent headaches. Will refill your anxiety medicine today. Do not take percocet for headaches. Make an appointment in 3-4 weeks after you have thyroid treatment, or sooner if needed.   Migraine Headache A migraine headache is an intense, throbbing pain on one or both sides of your head. The exact cause of a migraine headache is not always known. A migraine may be caused when nerves in the brain become irritated and release chemicals that cause swelling within blood vessels, causing pain. Many migraine sufferers have a family history of migraines. Before you get a migraine you may or may not get an aura. An aura is a group of symptoms that can predict the beginning of a migraine. An aura may include:  Visual changes such as:   Flashing lights.   Bright spots or zig-zag lines.   Tunnel vision.   Feelings of numbness.   Trouble talking.   Muscle weakness.  SYMPTOMS  Pain on one or both sides of your head.   Pain that is pulsating or throbbing in nature.   Pain that is severe enough to prevent daily activities.   Pain that is aggravated by any daily physical activity.   Nausea (feeling sick to your stomach), vomiting, or both.   Pain with exposure to bright lights, loud noises, or activity.   General sensitivity to bright lights or loud noises.  MIGRAINE TRIGGERS Examples of triggers of migraine headaches include:   Alcohol.   Smoking.   Stress.   It may be related to menses (female menstruation).   Aged cheeses.   Foods or drinks that contain nitrates, glutamate, aspartame, or tyramine.   Lack of sleep.   Chocolate.   Caffeine.   Hunger.   Medications such as nitroglycerine (used to treat chest pain), birth control pills, estrogen, and some blood pressure medications.  DIAGNOSIS  A migraine headache is often diagnosed based on:  Symptoms.     Physical examination.   A computerized X-ray scan (computed tomography, CT) of your head.  TREATMENT  Medications can help prevent migraines if they are recurrent or should they become recurrent. Your caregiver can help you with a medication or treatment program that will be helpful to you.   Lying down in a dark, quiet room may be helpful.   Keeping a headache diary may help you find a trend as to what may be triggering your headaches.  SEEK IMMEDIATE MEDICAL CARE IF:   You have confusion, personality changes or seizures.   You have headaches that wake you from sleep.   You have an increased frequency in your headaches.   You have a stiff neck.   You have a loss of vision.   You have muscle weakness.   You start losing your balance or have trouble walking.   You feel faint or pass out.  MAKE SURE YOU:   Understand these instructions.   Will watch your condition.   Will get help right away if you are not doing well or get worse.  Document Released: 06/10/2005 Document Revised: 02/20/2011 Document Reviewed: 01/24/2009 [facility] Patient Information 2012 [place], [place].

## 2011-06-07 NOTE — Assessment & Plan Note (Signed)
Patient has scheduled radio-iodine treatment next week. Will hold on propranolol prior to treatment. Isolation precautions discussed. Patient will follow up in 3-4 weeks after treatment for hormone check. Refilled prn xanax for anxiety attacks.

## 2011-06-07 NOTE — Progress Notes (Signed)
  Subjective:    Patient ID: Kendra Hall, female    DOB: Aug 20, 1965, 45 y.o.   MRN: [id number]  HPI 1. Migraines. Had 3 migraines in past 5 evening. Does not know of a trigger. Taking topamax at most 8/10 days. Also maxalt normally eases, but did not seem to help 2 nights ago. No HA today. Does not use OTC meds, but does sometimes take percocet for HA though not prescribed for this reason. Has not started propranol (rx for hyperthyroidism).   2. GERD. Bad heartburn past 3-4 days. Takes nexium only prn (maybe 3/7 days of the week).   3. Graves disease. Made nuclear med referral >1 month ago, but patient did not return their call. Wanted to wait until semester is over before scheduling due to isolation recommendations. Not taking prescribed beta blocker. Still having intermittent anxiety attacks. Again is uncertain about need for definitive treatment, we have discussed options of medical, surgical, radioiodine treatment previously. She is worried about weight gain and "blowing up" to 210 lbs again. Has lost 2 lbs since last visit without trying.   4. Breast nodule. Mammogram scheduled next week after discovered nodule on self-breast exam. Sister had breast cancer age 46.   Review of Systems See HPI otherwise negative. Active smoker.    Objective:   Physical Exam  Vitals reviewed. Constitutional: She is oriented to person, place, and time. She appears well-developed and well-nourished. No distress.  HENT:  Head: Normocephalic and atraumatic.  Neck: Thyromegaly present.  Cardiovascular: Normal rate, regular rhythm, normal heart sounds and intact distal pulses.   No murmur heard. Pulmonary/Chest: Effort normal and breath sounds normal. No respiratory distress. She has no wheezes. She has no rales.  Abdominal: Soft.  Genitourinary:       Right superior quad of right breast, pea-sized mobile nodule. No skin changes.  Musculoskeletal: She exhibits no edema and no tenderness.  Neurological: She  is alert and oriented to person, place, and time. No cranial nerve deficit. Coordination normal.  Skin: She is not diaphoretic.  Psychiatric: She has a normal mood and affect.          Assessment & Plan:

## 2011-06-07 NOTE — Assessment & Plan Note (Signed)
No change since last evaluation. Diagnostic mammogram scheduled at [facility], needed to be rescheduled due to thyroid treatment.

## 2011-06-07 NOTE — Assessment & Plan Note (Signed)
Daily PPI use recommended, likely the reason she has flares of symptoms at times.

## 2011-06-07 NOTE — Assessment & Plan Note (Signed)
Refilled chronic percocet for prn use. Discuss negative impact of chronic use, goal to avoid daily need as this may exacerbate headaches.

## 2011-06-07 NOTE — Assessment & Plan Note (Signed)
Reinforced compliance with prophylactic medication. No acute HA today. Follow up at next visit if still having increased frequency, may need to re-evaluate dose. Also recommend avoidance of NSAIDs and percocet as this may be med overuse headache.

## 2011-06-11 ENCOUNTER — Other Ambulatory Visit: Payer: Self-pay | Admitting: Family Medicine

## 2011-06-11 ENCOUNTER — Encounter (HOSPITAL_COMMUNITY)
Admission: RE | Admit: 2011-06-11 | Discharge: 2011-06-11 | Disposition: A | Discharge status: Home / Self Care | Payer: Medicare Other | Source: Ambulatory Visit | Attending: Family Medicine | Admitting: Family Medicine

## 2011-06-11 DIAGNOSIS — E0590 Thyrotoxicosis, unspecified without thyrotoxic crisis or storm: Secondary | ICD-10-CM

## 2011-06-11 DIAGNOSIS — E0500 Thyrotoxicosis with diffuse goiter without thyrotoxic crisis or storm: Secondary | ICD-10-CM

## 2011-06-11 LAB — HCG, SERUM, QUALITATIVE: Preg, Serum: NEGATIVE

## 2011-06-11 MED ORDER — SODIUM IODIDE I 131 CAPSULE
15.5000 | Freq: Once | INTRAVENOUS | Status: AC | PRN
  Administered 2011-06-11: 15.5 via ORAL

## 2011-06-14 ENCOUNTER — Other Ambulatory Visit: Payer: Medicare Other

## 2011-06-16 ENCOUNTER — Other Ambulatory Visit: Payer: Self-pay

## 2011-06-16 ENCOUNTER — Encounter (HOSPITAL_COMMUNITY): Payer: Self-pay | Admitting: *Deleted

## 2011-06-16 ENCOUNTER — Emergency Department (INDEPENDENT_AMBULATORY_CARE_PROVIDER_SITE_OTHER)
Admission: EM | Admit: 2011-06-16 | Discharge: 2011-06-16 | Disposition: A | Discharge status: Home / Self Care | Payer: Medicare Other | Source: Home / Self Care

## 2011-06-16 DIAGNOSIS — R0789 Other chest pain: Secondary | ICD-10-CM

## 2011-06-16 DIAGNOSIS — R071 Chest pain on breathing: Secondary | ICD-10-CM

## 2011-06-16 HISTORY — DX: Thyrotoxicosis with diffuse goiter without thyrotoxic crisis or storm: E05.00

## 2011-06-16 MED ORDER — TRAMADOL HCL 50 MG PO TABS: 50.0000 mg | ORAL_TABLET | Freq: Four times a day (QID) | ORAL | Status: AC | PRN

## 2011-06-16 MED ORDER — DICLOFENAC SODIUM 75 MG PO TBEC: 75.0000 mg | DELAYED_RELEASE_TABLET | Freq: Two times a day (BID) | ORAL | Status: DC

## 2011-06-16 NOTE — ED Notes (Signed)
Pt had radiation treatment for graves disease Tuesday - had been staying at home x 5 days - went out today sudden onset of chest pain - sharp pain through to back  - pain worse with movement and deep breath chest tender with minimal palpation

## 2011-06-16 NOTE — ED Provider Notes (Signed)
History     CSN: [id number]  Arrival date & time 06/16/11  1853   None     Chief Complaint  Patient presents with  . Chest Pain    (Consider location/radiation/quality/duration/timing/severity/associated sxs/prior treatment) HPI Comments: Pt presents with onset of Lt sided chest pain approx 3:00 this afternoon while she was walking in [place]. No dyspnea or diaphoresis. Pain is worse with inspiration, movement, certain positions and to the touch and she can feel the pain through to her back. She denies injury. She has been at home lying in her bed the last 5 days inactive due to the radiation treatment to her thyroid.  She has a hx of DM - well controlled. No hx of HTN or CAD. Father has a hx of CAD onset 45 yo.  Patient is a 45 y.o. female presenting with chest pain. The history is provided by the patient.  Chest Pain The chest pain began 3 - 5 hours ago. Chest pain occurs intermittently. The chest pain is unchanged. Associated with: movement, inspiration. The severity of the pain is moderate. The quality of the pain is described as sharp. The pain radiates to the upper back. Chest pain is worsened by deep breathing and certain positions. Pertinent negatives for primary symptoms include no fever, no shortness of breath, no cough, no wheezing, no palpitations, no abdominal pain, no nausea, no vomiting and no dizziness.  Pertinent negatives for associated symptoms include no diaphoresis and no numbness. She tried nothing for the symptoms.  Her past medical history is significant for diabetes.  Pertinent negatives for past medical history include no hypertension.     Past Medical History  Diagnosis Date  . Allergy   . Depression   . Diabetes mellitus   . Osteoarthritis     Facet hypertrophy with injections  . Multiple thyroid nodules   . Gastric ulcer   . Migraine   . Grave's disease     Past Surgical History  Procedure Date  . Tubal ligation   . Axillary lymph node  dissection 2001    Family History  Problem Relation Age of Onset  . Hypertension Mother   . Diabetes Mother   . Cancer Mother     rectal  . Heart disease Father 69    MI x5  . Hypertension Father   . Diabetes Father   . Cancer Sister 35    breast    History  Substance Use Topics  . Smoking status: Current Everyday Smoker -- 0.3 packs/day    Types: Cigarettes  . Smokeless tobacco: Not on file  . Alcohol Use: No    OB History    Grav Para Term Preterm Abortions TAB SAB Ect Mult Living                  Review of Systems  Constitutional: Negative for fever and diaphoresis.  Respiratory: Negative for cough, shortness of breath and wheezing.   Cardiovascular: Positive for chest pain. Negative for palpitations and leg swelling.  Gastrointestinal: Negative for nausea, vomiting and abdominal pain.  Neurological: Negative for dizziness and numbness.    Allergies  Review of patient's allergies indicates no known allergies.  Home Medications   Current Outpatient Rx  Name Route Sig Dispense Refill  . ALBUTEROL SULFATE HFA 108 (90 BASE) MCG/ACT IN AERS Inhalation Inhale 2 puffs into the lungs every 4 (four) hours as needed for wheezing or shortness of breath. 1 Inhaler 5  . ALPRAZOLAM 0.5 MG PO TABS Oral  Take 1 tablet (0.5 mg total) by mouth 3 (three) times daily as needed for anxiety. 15 tablet 0  . ESOMEPRAZOLE MAGNESIUM 40 MG PO CPDR Oral Take 1 capsule (40 mg total) by mouth daily before breakfast. 90 capsule 3  . OXYCODONE-ACETAMINOPHEN 5-325 MG PO TABS Oral Take 1 tablet by mouth 2 (two) times daily. 20 tablet 0  . PROMETHAZINE HCL 25 MG PO TABS Oral Take 0.5 tablets (12.5 mg total) by mouth every 8 (eight) hours as needed for nausea. 30 tablet 1  . RIZATRIPTAN BENZOATE 10 MG PO TBDP Oral Take 1 tablet (10 mg total) by mouth as needed for migraine. May repeat in 2 hours if needed 10 tablet 0  . TOPIRAMATE 25 MG PO TABS Oral Take 1 tablet (25 mg total) by mouth 3 (three)  times daily. For headache prevention. 180 tablet 5  . PRODIGY POCKET BLOOD GLUCOSE W/DEVICE KIT       . DICLOFENAC SODIUM 75 MG PO TBEC Oral Take 1 tablet (75 mg total) by mouth 2 (two) times daily. 20 tablet 0  . FLUTICASONE PROPIONATE 50 MCG/ACT NA SUSP Nasal 2 sprays by Nasal route daily.      Marland Kitchen LISINOPRIL 5 MG PO TABS Oral Take 0.5 tablets (2.5 mg total) by mouth daily. 30 tablet 5  . METFORMIN HCL 500 MG PO TABS Oral Take 1 tablet (500 mg total) by mouth 2 (two) times daily with a meal. 180 tablet 3  . PROPRANOLOL HCL 20 MG PO TABS Oral Take 1 tablet (20 mg total) by mouth 3 (three) times daily. 90 tablet 1  . TRAMADOL HCL 50 MG PO TABS Oral Take 1 tablet (50 mg total) by mouth every 6 (six) hours as needed for pain. Maximum dose= 8 tablets per day 12 tablet 0    BP 142/98  Pulse 87  Temp(Src) 98.7 F (37.1 C) (Oral)  Resp 24  SpO2 97%  LMP 05/28/2011  Physical Exam  Nursing note and vitals reviewed. Constitutional: She appears well-developed and well-nourished. No distress.  HENT:  Head: Normocephalic and atraumatic.  Right Ear: Tympanic membrane, external ear and ear canal normal.  Left Ear: Tympanic membrane, external ear and ear canal normal.  Nose: Nose normal.  Mouth/Throat: Uvula is midline, oropharynx is clear and moist and mucous membranes are normal. No oropharyngeal exudate, posterior oropharyngeal edema or posterior oropharyngeal erythema.  Neck: Neck supple.       No swelling and nontender  Cardiovascular: Normal rate, regular rhythm and normal heart sounds.   Pulmonary/Chest: Effort normal and breath sounds normal. No respiratory distress. She exhibits bony tenderness.    Abdominal: Soft. Bowel sounds are normal. She exhibits no distension and no mass. There is no tenderness. There is no guarding.  Musculoskeletal:       Back:  Lymphadenopathy:    She has no cervical adenopathy.  Neurological: She is alert.  Skin: Skin is warm and dry.  Psychiatric: She has  a normal mood and affect.    ED Course  Procedures (including critical care time)  Labs Reviewed - No data to display No results found.   1. Chest wall pain       MDM  EKG NSR rate 82.  Reproducable chest wall pain.        Melody Comas, [place] 06/16/11 2036

## 2011-06-16 NOTE — ED Provider Notes (Signed)
Medical screening examination/treatment/procedure(s) were performed by non-physician practitioner and as supervising physician I was immediately available for consultation/collaboration.  Roslin Norwood   Sachiko Methot Charles Brinley Treanor, MD 06/16/11 2037 

## 2011-07-01 ENCOUNTER — Ambulatory Visit
Admission: RE | Admit: 2011-07-01 | Discharge: 2011-07-01 | Disposition: A | Discharge status: Home / Self Care | Payer: Medicare Other | Source: Ambulatory Visit | Attending: Family Medicine | Admitting: Family Medicine

## 2011-07-01 DIAGNOSIS — N6489 Other specified disorders of breast: Secondary | ICD-10-CM | POA: Diagnosis not present

## 2011-07-01 DIAGNOSIS — N6019 Diffuse cystic mastopathy of unspecified breast: Secondary | ICD-10-CM | POA: Diagnosis not present

## 2011-07-01 DIAGNOSIS — N6311 Unspecified lump in the right breast, upper outer quadrant: Secondary | ICD-10-CM

## 2011-07-08 ENCOUNTER — Ambulatory Visit: Payer: Medicare Other | Admitting: Family Medicine

## 2011-08-02 ENCOUNTER — Ambulatory Visit (INDEPENDENT_AMBULATORY_CARE_PROVIDER_SITE_OTHER): Payer: Medicare Other | Admitting: Family Medicine

## 2011-08-02 ENCOUNTER — Encounter: Payer: Self-pay | Admitting: Family Medicine

## 2011-08-02 VITALS — BP 120/82 | HR 68 | Temp 98.2°F | Ht 63.0 in | Wt 195.0 lb

## 2011-08-02 DIAGNOSIS — M545 Low back pain, unspecified: Secondary | ICD-10-CM | POA: Diagnosis not present

## 2011-08-02 DIAGNOSIS — E0500 Thyrotoxicosis with diffuse goiter without thyrotoxic crisis or storm: Secondary | ICD-10-CM

## 2011-08-02 DIAGNOSIS — M79609 Pain in unspecified limb: Secondary | ICD-10-CM | POA: Diagnosis not present

## 2011-08-02 DIAGNOSIS — N63 Unspecified lump in unspecified breast: Secondary | ICD-10-CM | POA: Diagnosis not present

## 2011-08-02 DIAGNOSIS — G43909 Migraine, unspecified, not intractable, without status migrainosus: Secondary | ICD-10-CM

## 2011-08-02 DIAGNOSIS — N6311 Unspecified lump in the right breast, upper outer quadrant: Secondary | ICD-10-CM

## 2011-08-02 LAB — T4, FREE: Free T4: 0.78 ng/dL — ABNORMAL LOW (ref 0.80–1.80)

## 2011-08-02 LAB — TSH: TSH: <0.008 u[IU]/mL — ABNORMAL LOW (ref 0.350–4.500)

## 2011-08-02 MED ORDER — OXYCODONE-ACETAMINOPHEN 5-325 MG PO TABS: 1.0000 | ORAL_TABLET | Freq: Two times a day (BID) | ORAL | Status: DC

## 2011-08-02 MED ORDER — TOPIRAMATE 100 MG PO TABS: 100.0000 mg | ORAL_TABLET | Freq: Every day | ORAL | Status: DC

## 2011-08-02 NOTE — Patient Instructions (Signed)
Have xray of your toe.  Likely arthritis. OK to take tylenol for pain. Avoid uncomfortable shoes. Get arch supports. Will call about thyroid results and need for medicine. Increase your topamax dose to 100mg  daily. Avoid overusing maxalt-limit to two headaches per week, Make an appointment in 3 weeks for follow up.

## 2011-08-03 ENCOUNTER — Telehealth: Payer: Self-pay | Admitting: Family Medicine

## 2011-08-03 MED ORDER — LEVOTHYROXINE SODIUM 100 MCG PO TABS: 100.0000 ug | ORAL_TABLET | Freq: Every day | ORAL | Status: DC

## 2011-08-03 NOTE — Assessment & Plan Note (Addendum)
Now 6 weeks post ablation. Signs indicate likely hypothyroidism. Will check TSH (may be falsely low in immediate post-treatment period) and free T4 level to assess dose requirement and follow up in 4-6 weeks for titration.

## 2011-08-03 NOTE — Progress Notes (Signed)
  Subjective:    Patient ID: Kendra Hall, female    DOB: Dec 12, 1965, 46 y.o.   MRN: [id number]  HPI  1. Graves disease. Now is 6 weeks post radioiodine ablation. No pain or swelling in thyroid. Patient has gained 15 lbs, feels fatigued and having trouble sleeping. Palpitations and anxiety attacks have resolved.   2. Migraines. Having increase frequency. Not entirely compliant with topamax, taking 75mg  most days of the week. Using maxalt 6 times in past 2 weeks. Denies syncope, weakness, numbness, change in headache characteristics.   3. Toe pain. Began 2 years ago. Exacerbated only when she wears uncomfortable boots, without insole support. Had mild swelling that has improved past few days. Other joint pains include back and knees. No hand involvement. No recent alcohol use.   4. Back pain. MRI showing facet degeneration, history of steroid injections. Requests refill of pain medication, uses only on some days. No changes in pattern or intensity recently. Denies weakness, numbness.  Review of Systems See HPI otherwise negative. Current daily smoker .    Objective:   Physical Exam  Vitals reviewed. Constitutional: She is oriented to person, place, and time. She appears well-nourished.       Appears fatigued   HENT:  Head: Normocephalic and atraumatic.  Mouth/Throat: Oropharynx is clear and moist.  Eyes: EOM are normal.  Neck: No thyromegaly present.       No nodules or tenderness.  Cardiovascular: Normal rate, regular rhythm and normal heart sounds.   No murmur heard. Pulmonary/Chest: Effort normal and breath sounds normal. No respiratory distress. She has no wheezes.  Musculoskeletal: She exhibits no edema.       Left great toe mild TTP over MEP joint/extensor tendon. No erythema or edema noted.   Neurological: She is alert and oriented to person, place, and time. Coordination normal.  Skin: Skin is dry.  Psychiatric: She has a normal mood and affect.      Assessment & Plan:

## 2011-08-03 NOTE — Assessment & Plan Note (Signed)
Refilled percocet as patient is using only when pain is severe, not daily. F/u at next visit.

## 2011-08-03 NOTE — Telephone Encounter (Signed)
Called to discuss lab results. TSH very low. Will need to start thyroid replacement as expected. Have sent rx to pharmacy today. Needs to f/u in 6 weeks for TSH testing again. Patient agrees.

## 2011-08-03 NOTE — Assessment & Plan Note (Signed)
Now is recurrent, but does not seem as severe as typical gout. Likely this is arthritis or some extensor tendonitis exacerbated by poor footwear. Will check a plain film to rule out evidence of longstanding gout as this would altar treatment. Recommend tylenol as symptoms are already improving.

## 2011-08-03 NOTE — Assessment & Plan Note (Signed)
Increased frequency. Will increase preventative topamax dose to 100mg  daily. Discuss monitoring for potential side effects and again urged compliance on a daily basis. Recommended avoidance of maxalt use >2x/week to avoid rebound headache which may be contributing to current issues. F/u in one month.

## 2011-08-28 ENCOUNTER — Encounter: Payer: Self-pay | Admitting: Family Medicine

## 2011-08-28 ENCOUNTER — Ambulatory Visit (INDEPENDENT_AMBULATORY_CARE_PROVIDER_SITE_OTHER): Payer: Medicare Other | Admitting: Family Medicine

## 2011-08-28 VITALS — BP 129/79 | HR 63 | Temp 98.4°F | Ht 63.0 in | Wt 192.0 lb

## 2011-08-28 DIAGNOSIS — M79609 Pain in unspecified limb: Secondary | ICD-10-CM

## 2011-08-28 DIAGNOSIS — E890 Postprocedural hypothyroidism: Secondary | ICD-10-CM | POA: Diagnosis not present

## 2011-08-28 DIAGNOSIS — E119 Type 2 diabetes mellitus without complications: Secondary | ICD-10-CM

## 2011-08-28 DIAGNOSIS — E0590 Thyrotoxicosis, unspecified without thyrotoxic crisis or storm: Secondary | ICD-10-CM | POA: Diagnosis not present

## 2011-08-28 DIAGNOSIS — G43909 Migraine, unspecified, not intractable, without status migrainosus: Secondary | ICD-10-CM

## 2011-08-28 DIAGNOSIS — M79646 Pain in unspecified finger(s): Secondary | ICD-10-CM

## 2011-08-28 LAB — POCT GLYCOSYLATED HEMOGLOBIN (HGB A1C): Hemoglobin A1C: 7.2

## 2011-08-28 MED ORDER — CYCLOBENZAPRINE HCL 10 MG PO TABS: 10.0000 mg | ORAL_TABLET | Freq: Two times a day (BID) | ORAL | Status: AC | PRN

## 2011-08-28 NOTE — Assessment & Plan Note (Signed)
Will recheck TSH and free hormones. Titrate medication as needed. Patient is asymptomatic and will continue 100 mg for now.

## 2011-08-28 NOTE — Assessment & Plan Note (Signed)
Symptoms typical to her usual migraine, no neurological signs. Patient refuses Toradol or injectable abortive medicine. With her cervical muscle spasm, we'll try Flexeril each bedtime. If this fails she may try her entered in oral medicine and she is tolerating well. Patient to determine what which dose Topamax she is taking and will followup in one month to discuss further titration. Avoid opiates and excessive NSAID use. Encouraged to return to clinic in the next 1-2 days if symptoms worsen or do not improve.

## 2011-08-28 NOTE — Progress Notes (Signed)
  Subjective:    Patient ID: Kendra Hall, female    DOB: [birth year], 46 y.o.   MRN: [id number]  HPI  1. Migraine. Patient has history of migraines and currently has one lasting for last 5 days. Pain waxes and wanes and sometimes resolves after short naps but ultimately returned. Has also taken Maxalt total of 4 doses in the past 5 days with temporary relief. Patient describes pain as normal throbbing consistent with previous headaches and no known triggers to current. The patient is unsure which dose of Topamax she has been taking as we recently increased her to 100 mg. She states she may only be taking 25 mg tablets. Tried one Percocet also. Positive for photophobia and phonophobia. Denies any weakness, numbness, falls.  Has required injection abortive meds in the past, however she refuses these today.  2. Hypothyroidism. Patient is now 3 months status post radioiodine ablation. She has started levothyroxin therapy as prescribed. She has lost 3 pounds in past one month. Denies palpitations, edema, chest pain.  3. Right index finger pain. Onset in past one week. Has mild swelling over dorsal proximal phalanx. This has improved somewhat. Denies pain and joint. No trauma  Review of Systems See HPI otherwise negative.     Objective:   Physical Exam  Vitals reviewed. Constitutional: She is oriented to person, place, and time. She appears well-developed and well-nourished.       Sitting with lights down low. Photophobia. Appears uncomfortable but conversant and does laugh, smile.  HENT:  Head: Normocephalic and atraumatic.  Mouth/Throat: Oropharynx is clear and moist. No oropharyngeal exudate.  Eyes: EOM are normal. Pupils are equal, round, and reactive to light.  Neck: Neck supple.       Tight bilateral trapezial muscles, cervical paraspinal musculature. No stiffness. Full ROM intact.  Cardiovascular: Normal rate, regular rhythm, normal heart sounds and intact distal pulses.   No murmur  heard. Pulmonary/Chest: Effort normal and breath sounds normal.  Abdominal: Soft.  Musculoskeletal: She exhibits no edema.  Lymphadenopathy:    She has no cervical adenopathy.  Neurological: She is alert and oriented to person, place, and time. No cranial nerve deficit. She exhibits normal muscle tone. Coordination normal.  Skin: No rash noted.  Psychiatric: She has a normal mood and affect.      Assessment & Plan:

## 2011-08-28 NOTE — Assessment & Plan Note (Signed)
No significant changes in glycemic control. We'll continue metformin as patient is only somewhat compliant. Followup in 3 months.

## 2011-08-28 NOTE — Patient Instructions (Signed)
Make sure you are taking topamax 100mg  daily. OK to try flexeril for headache. Will make you sleepy. If that doesn't work, try phenergan. If that doesn't wok, try maxalt, but no more than once weekly.  Will call you if we need to change thyroid medications. Make an appointment if headache doesn't improve.

## 2011-08-29 LAB — COMPREHENSIVE METABOLIC PANEL
ALT: 11 U/L (ref 0–35)
AST: 17 U/L (ref 0–37)
Albumin: 4.1 g/dL (ref 3.5–5.2)
Alkaline Phosphatase: 110 U/L (ref 39–117)
BUN: 12 mg/dL (ref 6–23)
CO2: 23 mEq/L (ref 19–32)
Calcium: 9.3 mg/dL (ref 8.4–10.5)
Chloride: 107 mEq/L (ref 96–112)
Creat: 1.05 mg/dL (ref 0.50–1.10)
Glucose, Bld: 114 mg/dL — ABNORMAL HIGH (ref 70–99)
Potassium: 4.4 mEq/L (ref 3.5–5.3)
Sodium: 139 mEq/L (ref 135–145)
Total Bilirubin: 0.5 mg/dL (ref 0.3–1.2)
Total Protein: 7 g/dL (ref 6.0–8.3)

## 2011-08-29 LAB — CBC
HCT: 42.2 % (ref 36.0–46.0)
Hemoglobin: 13.9 g/dL (ref 12.0–15.0)
MCH: 25.9 pg — ABNORMAL LOW (ref 26.0–34.0)
MCHC: 32.9 g/dL (ref 30.0–36.0)
MCV: 78.6 fL (ref 78.0–100.0)
Platelets: 321 10*3/uL (ref 150–400)
RBC: 5.37 MIL/uL — ABNORMAL HIGH (ref 3.87–5.11)
RDW: 16.3 % — ABNORMAL HIGH (ref 11.5–15.5)
WBC: 5.4 10*3/uL (ref 4.0–10.5)

## 2011-08-29 LAB — T4, FREE: Free T4: 1.19 ng/dL (ref 0.80–1.80)

## 2011-08-29 LAB — TSH: TSH: 0.431 u[IU]/mL (ref 0.350–4.500)

## 2011-08-29 LAB — T3, FREE: T3, Free: 1.9 pg/mL — ABNORMAL LOW (ref 2.3–4.2)

## 2011-08-30 DIAGNOSIS — M79646 Pain in unspecified finger(s): Secondary | ICD-10-CM | POA: Insufficient documentation

## 2011-08-30 NOTE — Assessment & Plan Note (Signed)
Pain over her extensor tendon sheath. Does not seem joint related. Recommend splinting/buddy taping and NSAIDs when necessary. Followup if not improved with conservative management.

## 2011-09-17 ENCOUNTER — Encounter: Payer: Self-pay | Admitting: Family Medicine

## 2011-09-17 ENCOUNTER — Ambulatory Visit (INDEPENDENT_AMBULATORY_CARE_PROVIDER_SITE_OTHER): Payer: Medicare Other | Admitting: Family Medicine

## 2011-09-17 VITALS — BP 133/88 | HR 80 | Temp 98.8°F | Ht 63.0 in | Wt 195.7 lb

## 2011-09-17 DIAGNOSIS — J111 Influenza due to unidentified influenza virus with other respiratory manifestations: Secondary | ICD-10-CM

## 2011-09-17 MED ORDER — BENZONATATE 100 MG PO CAPS: 100.0000 mg | ORAL_CAPSULE | Freq: Two times a day (BID) | ORAL | Status: DC | PRN

## 2011-09-17 MED ORDER — OSELTAMIVIR PHOSPHATE 75 MG PO CAPS: 75.0000 mg | ORAL_CAPSULE | Freq: Two times a day (BID) | ORAL | Status: DC

## 2011-09-17 MED ORDER — LEVOTHYROXINE SODIUM 100 MCG PO TABS: 100.0000 ug | ORAL_TABLET | Freq: Every day | ORAL | Status: DC

## 2011-09-17 NOTE — Patient Instructions (Signed)
Sounds like you have the flu. You are contagious, so wash hands well, avoid small children. Start tamiflu as soon as possible for best results!! Stay hydrated with fluids.  May take motrin or tylenol for aches and pains. May use albuterol inhaler as needed for wheezing. Make an appointment in next week if not improved.  Influenza Facts Flu (influenza) is a contagious respiratory illness caused by the influenza viruses. It can cause mild to severe illness. While most healthy people recover from the flu without specific treatment and without complications, older people, young children, and people with certain health conditions are at higher risk for serious complications from the flu, including death. CAUSES   The flu virus is spread from person to person by respiratory droplets from coughing and sneezing.   A person can also become infected by touching an object or surface with a virus on it and then touching their mouth, eye or nose.   Adults may be able to infect others from 1 day before symptoms occur and up to 7 days after getting sick. So it is possible to give someone the flu even before you know you are sick and continue to infect others while you are sick.  SYMPTOMS   Fever (usually high).   Headache.   Tiredness (can be extreme).   Cough.   Sore throat.   Runny or stuffy nose.   Body aches.   Diarrhea and vomiting may also occur, particularly in children.   These symptoms are referred to as "flu-like symptoms". A lot of different illnesses, including the common cold, can have similar symptoms.  DIAGNOSIS   There are tests that can determine if you have the flu as long you are tested within the first 2 or 3 days of illness.   A doctor's exam and additional tests may be needed to identify if you have a disease that is a complicating the flu.  RISKS AND COMPLICATIONS  Some of the complications caused by the flu include:  Bacterial pneumonia or progressive pneumonia  caused by the flu virus.   Loss of body fluids (dehydration).   Worsening of chronic medical conditions, such as heart failure, asthma, or diabetes.   Sinus problems and ear infections.  HOME CARE INSTRUCTIONS   Seek medical care early on.   If you are at high risk from complications of the flu, consult your health-care provider as soon as you develop flu-like symptoms. Those at high risk for complications include:   People 65 years or older.   People with chronic medical conditions, including diabetes.   Pregnant women.   Young children.   Your caregiver may recommend use of an antiviral medication to help treat the flu.   If you get the flu, get plenty of rest, drink a lot of liquids, and avoid using alcohol and tobacco.   You can take over-the-counter medications to relieve the symptoms of the flu if your caregiver approves. (Never give aspirin to children or teenagers who have flu-like symptoms, particularly fever).  PREVENTION  The single best way to prevent the flu is to get a flu vaccine each fall. Other measures that can help protect against the flu are:  Antiviral Medications   A number of antiviral drugs are approved for use in preventing the flu. These are prescription medications, and a doctor should be consulted before they are used.   Habits for Good Health   Cover your nose and mouth with a tissue when you cough or sneeze, throw  the tissue away after you use it.   Wash your hands often with soap and water, especially after you cough or sneeze. If you are not near water, use an alcohol-based hand cleaner.   Avoid people who are sick.   If you get the flu, stay home from work or school. Avoid contact with other people so that you do not make them sick, too.   Try not to touch your eyes, nose, or mouth as germs ore often spread this way.  IN CHILDREN, EMERGENCY WARNING SIGNS THAT NEED URGENT MEDICAL ATTENTION:  Fast breathing or trouble breathing.   Bluish  skin color.   Not drinking enough fluids.   Not waking up or not interacting.   Being so irritable that the child does not want to be held.   Flu-like symptoms improve but then return with fever and worse cough.   Fever with a rash.  IN ADULTS, EMERGENCY WARNING SIGNS THAT NEED URGENT MEDICAL ATTENTION:  Difficulty breathing or shortness of breath.   Pain or pressure in the chest or abdomen.   Sudden dizziness.   Confusion.   Severe or persistent vomiting.  SEEK IMMEDIATE MEDICAL CARE IF:  You or someone you know is experiencing any of the symptoms above. When you arrive at the emergency center,report that you think you have the flu. You may be asked to wear a mask and/or sit in a secluded area to protect others from getting sick. MAKE SURE YOU:   Understand these instructions.   Monitor your condition.   Seek medical care if you are getting worse, or not improving.  Document Released: 06/13/2003 Document Revised: 05/30/2011 Document Reviewed: 03/09/2009 [facility] Patient Information 2012 [place], [place].

## 2011-09-18 NOTE — Progress Notes (Signed)
  Subjective:    Patient ID: Kendra Hall, female    DOB: Jun 20, 1966, 46 y.o.   MRN: [id number]  HPI  1. Flu like symptoms. Patient began having malaise, myalgias, cough and sore throat yesterday morning. Has subjective fevers also. Had some intermittent wheezing and dry cough. Denies any dyspnea, productive cough, emesis, diarrhea, rash. Sick contact is young grandson with pneumonia recently. Did not take flu vaccine this year. Does smoke 1 ppd. Has taken only albuterol inhaler as needed.   Review of Systems See HPI otherwise negative.     Objective:   Physical Exam  Vitals reviewed. Constitutional: She is oriented to person, place, and time. She appears well-developed. No distress.       Appears fatigued.  HENT:  Head: Normocephalic and atraumatic.  Right Ear: External ear normal.  Left Ear: External ear normal.       Pharynx injected, no exudate, no LAD. Nasal congestion.  Eyes: EOM are normal. Pupils are equal, round, and reactive to light.  Neck: Neck supple. No thyromegaly present.  Cardiovascular: Normal rate, regular rhythm, normal heart sounds and intact distal pulses.   No murmur heard. Pulmonary/Chest: Effort normal and breath sounds normal. No respiratory distress. She has no wheezes. She has no rales.  Abdominal: Soft. There is no tenderness.  Neurological: She is alert and oriented to person, place, and time. Coordination normal.  Skin: She is not diaphoretic.  Psychiatric: She has a normal mood and affect.       Assessment & Plan:

## 2011-09-18 NOTE — Assessment & Plan Note (Signed)
Since onset <48 hr, patient with smoking history and wheezing history will treat with tamiflu. Discussed possible side effects. Recommend suppportive care, oral hydration, tylenol or motrin prn for myalgias. Patient has albuterol inhaler from recent PNA treatment she can use prn. Advised to return if not improving in 3-5 days.

## 2011-09-27 ENCOUNTER — Ambulatory Visit (INDEPENDENT_AMBULATORY_CARE_PROVIDER_SITE_OTHER): Payer: Medicare Other | Admitting: Family Medicine

## 2011-09-27 ENCOUNTER — Encounter: Payer: Self-pay | Admitting: Family Medicine

## 2011-09-27 VITALS — BP 126/79 | HR 72 | Ht 63.0 in | Wt 198.0 lb

## 2011-09-27 DIAGNOSIS — E890 Postprocedural hypothyroidism: Secondary | ICD-10-CM | POA: Diagnosis not present

## 2011-09-27 DIAGNOSIS — Z113 Encounter for screening for infections with a predominantly sexual mode of transmission: Secondary | ICD-10-CM | POA: Insufficient documentation

## 2011-09-27 DIAGNOSIS — N921 Excessive and frequent menstruation with irregular cycle: Secondary | ICD-10-CM

## 2011-09-27 DIAGNOSIS — E119 Type 2 diabetes mellitus without complications: Secondary | ICD-10-CM | POA: Diagnosis not present

## 2011-09-27 DIAGNOSIS — E041 Nontoxic single thyroid nodule: Secondary | ICD-10-CM | POA: Diagnosis not present

## 2011-09-27 DIAGNOSIS — N920 Excessive and frequent menstruation with regular cycle: Secondary | ICD-10-CM | POA: Diagnosis not present

## 2011-09-27 DIAGNOSIS — H538 Other visual disturbances: Secondary | ICD-10-CM

## 2011-09-27 DIAGNOSIS — F329 Major depressive disorder, single episode, unspecified: Secondary | ICD-10-CM | POA: Diagnosis not present

## 2011-09-27 DIAGNOSIS — F3289 Other specified depressive episodes: Secondary | ICD-10-CM

## 2011-09-27 LAB — POCT URINE PREGNANCY: Preg Test, Ur: NEGATIVE

## 2011-09-27 NOTE — Patient Instructions (Signed)
Unsure of the cause of your abdominal pain and irregular bleeding. We can check pelvic ultrasound to look at your uterus.  If your bleeding returns, call the office for appointment sooner. I do no think your medicine is the cause of symptoms-you do not have signs of excess thyroid hormone. Will make referral to psychiatrist for depression. Make an appointment with your eye doctor, as you may need glasses or have diabetes changes in your eyes. Make an appointment with me in one month for follow up.

## 2011-09-28 ENCOUNTER — Encounter: Payer: Self-pay | Admitting: Family Medicine

## 2011-09-28 DIAGNOSIS — E041 Nontoxic single thyroid nodule: Secondary | ICD-10-CM | POA: Insufficient documentation

## 2011-09-28 DIAGNOSIS — H538 Other visual disturbances: Secondary | ICD-10-CM | POA: Insufficient documentation

## 2011-09-28 NOTE — Assessment & Plan Note (Signed)
Seems to be euthyroid based on recent labs and absence of symptoms.

## 2011-09-28 NOTE — Assessment & Plan Note (Signed)
Potentially secondary to thyroid disease, though this seems relatively stable. U preg negative. Will obtain pelvic US to evaluate for structural cause (fibroid, hyperplasia), esp in conjunction with abdominal discomfort. Urine GC/Ch obtained. Patient to follow up if recurrent as her bleeding has subsided, may need hormonal therapy or iron replacement if signs of anemia develop.

## 2011-09-28 NOTE — Assessment & Plan Note (Signed)
Patient to follow up with ophthalmologist for exam-she is already established and last visit >1 year ago. States she does not require new referral.

## 2011-09-28 NOTE — Assessment & Plan Note (Signed)
Patient requests referral for psychologist as she is without. Has been given contact info for [employer].

## 2011-09-28 NOTE — Progress Notes (Signed)
  Subjective:    Patient ID: Kendra Hall, female    DOB: 10-02-1965, 46 y.o.   MRN: [id number]  HPI  1. Heavy menstrual bleeding. Patient having regular periods mostly until this month. Had typical menses 3/18-23 and then began having heavy flow again 3/28 for 4 days. Used 3 pads + tampon and still soaked through in 2-3 hours. No history of this previously. Her bleeding resolved 2 days ago spontaneously. Complains of lower abdominal discomfort during this same interval that is intermittent.  She denies any sexual activity > 1year, no discharge, orthostasis, abnormal bruising, nausea, emesis, fever, dysuria.   2. Blurry vision. Only during reading at close distance. She has been told she needs glasses; however has not followed up. She denies diplopia, light sensitivity, HA, trauma.  3. DM. Not taking metformin or checking CBGs.  4. Hypothyroid. Taking levothyroxine most days. Patient thinks it causes her abdominal discomfort some days. Denies weight changes, palpitations, anxiety attacks, diarrhea, constipation.  Review of Systems See HPI otherwise negative. Active smoker. Unemployed-On disability for back pain/arthritis and depression.     Objective:   Physical Exam  Vitals reviewed. Constitutional: She is oriented to person, place, and time. She appears well-developed and well-nourished. No distress.  HENT:  Head: Normocephalic and atraumatic.  Mouth/Throat: Oropharynx is clear and moist.  Eyes: EOM are normal. Pupils are equal, round, and reactive to light.  Neck: Neck supple.       No thyroid nodules detected.  Cardiovascular: Normal rate, regular rhythm and normal heart sounds.   No murmur heard. Pulmonary/Chest: Effort normal and breath sounds normal.  Abdominal: Soft. Bowel sounds are normal. She exhibits no distension and no mass. There is no rebound and no guarding.       Mild TTP in infra-umbilical/high suprapubic area.   Musculoskeletal: She exhibits no edema.    Neurological: She is alert and oriented to person, place, and time. Coordination normal.  Skin: No rash noted.  Psychiatric: She has a normal mood and affect. Thought content normal.       Assessment & Plan:

## 2011-09-28 NOTE — Assessment & Plan Note (Signed)
Last A1c borderline. Patient noncompliant with medication, desires to try dietary modifications. F/u A1c in 2 months.

## 2011-09-28 NOTE — Assessment & Plan Note (Signed)
Review of imaging studies: in 2011 had two small hypoechoic nodules noted on US-follow up NM scan did not show diffusion abnormalities here, but patient never followed up with her repeat US for surveillance of nodules. I have advised her to do this as soon as possible. She may call our office to schedule test after her pelvic US is performed.

## 2011-10-01 ENCOUNTER — Ambulatory Visit
Admission: RE | Admit: 2011-10-01 | Discharge: 2011-10-01 | Disposition: A | Discharge status: Home / Self Care | Payer: Medicare Other | Source: Ambulatory Visit | Attending: Family Medicine | Admitting: Family Medicine

## 2011-10-01 ENCOUNTER — Other Ambulatory Visit: Payer: Self-pay | Admitting: Family Medicine

## 2011-10-01 ENCOUNTER — Telehealth: Payer: Self-pay | Admitting: *Deleted

## 2011-10-01 ENCOUNTER — Other Ambulatory Visit (HOSPITAL_COMMUNITY)
Admission: RE | Admit: 2011-10-01 | Discharge: 2011-10-01 | Disposition: A | Discharge status: Home / Self Care | Payer: Medicare Other | Source: Ambulatory Visit | Attending: Family Medicine | Admitting: Family Medicine

## 2011-10-01 DIAGNOSIS — N898 Other specified noninflammatory disorders of vagina: Secondary | ICD-10-CM | POA: Diagnosis not present

## 2011-10-01 DIAGNOSIS — N921 Excessive and frequent menstruation with irregular cycle: Secondary | ICD-10-CM

## 2011-10-01 DIAGNOSIS — N949 Unspecified condition associated with female genital organs and menstrual cycle: Secondary | ICD-10-CM | POA: Diagnosis not present

## 2011-10-01 DIAGNOSIS — E049 Nontoxic goiter, unspecified: Secondary | ICD-10-CM

## 2011-10-01 NOTE — Telephone Encounter (Signed)
Set up appointment for [place] head/neck for 10/02/2011 @ 2 pm. [address] [facility]. Spoke with patient and notified her of this appointment and she agreed to it

## 2011-10-02 ENCOUNTER — Other Ambulatory Visit: Payer: Medicare Other

## 2011-10-02 ENCOUNTER — Encounter: Payer: Medicare Other | Admitting: Family Medicine

## 2011-10-02 DIAGNOSIS — R6889 Other general symptoms and signs: Secondary | ICD-10-CM

## 2011-10-02 LAB — POCT RAPID STREP A (OFFICE): Rapid Strep A Screen: NEGATIVE

## 2011-10-02 MED ORDER — AMOXICILLIN-POT CLAVULANATE 875-125 MG PO TABS: 1.0000 | ORAL_TABLET | Freq: Two times a day (BID) | ORAL | Status: DC

## 2011-10-02 MED ORDER — GUAIFENESIN-CODEINE 100-10 MG/5ML PO SYRP: 5.0000 mL | ORAL_SOLUTION | Freq: Every evening | ORAL | Status: DC | PRN

## 2011-10-02 MED ORDER — LORATADINE 10 MG PO TABS: 10.0000 mg | ORAL_TABLET | Freq: Every day | ORAL | Status: DC

## 2011-10-02 NOTE — Progress Notes (Signed)
Addended by: Madolyn Frieze, Marylene Land J on: 10/02/2011 02:24 PM   Modules accepted: Orders

## 2011-10-02 NOTE — Assessment & Plan Note (Addendum)
Patient complaining of subjective fevers/chills, fatigue, myalgias for the past 3 days. Rapid strep negative.  She was recently treated for flu about 2 week ago.  It would be unusual for her to have the flu again, however, she may be having subsequent bacterial rhinosinusitis or may have picked up another virus. Will start course of antibiotics. Ibuprofen/Tylenol and albuterol prn. Cough medication for bedtime since she is having difficulty sleeping. Anti-histamine due to history of seasonal allergies that may be contributing to her symptoms and with the recent change of seasons.  Patient will follow-up prn 1 week if not improving or worsening symptoms.

## 2011-10-02 NOTE — Progress Notes (Signed)
Addended by: Madolyn Frieze, Marylene Land J on: 10/02/2011 02:25 PM   Modules accepted: Orders

## 2011-10-02 NOTE — Progress Notes (Incomplete)
  Subjective:    Patient ID: Kendra Hall, female    DOB: 13-Feb-1966, 46 y.o.   MRN: [id number]  HPI Acute visit: flu-like symptoms  The patient was recently diagnosed and treated for flu about 2 weeks ago (finished course of Tamiflu). She recovered from that illness however on Sunday 04/07, she started experiencing similar symptoms: subjective febrile/chlls, muscle aches, cough/congestion. Her symptoms have persisted, and she feels very tired and has not been able to go to school starting from Monday.   Review of Systems Denies nausea/vomiting. Complaining of some shortness-of-breath relieved by albuterol inhaler. Coughing is worse at night. Sometimes productive of yellowish tinged sputum; she thinks she noticed some blood in it today.  Stopped smoking 2 days ago.     Objective:   Physical Exam Gen: appears uncomfortable HEENT:   Eyes: normal without tearing/injection   Ears: TM clear    Nose: congestion without active rhinorrhea   Throat: mod tonsillar adenopathy without exudates; mod oropharyngeal erythema   Neck: submandibular fullness without obvious lymph nodes palpable Lungs: NI WOB, CTAB without w/r/r (patient just used albuterol inhaler)    Assessment & Plan:

## 2011-10-02 NOTE — Progress Notes (Deleted)
This encounter was created in error - please disregard.

## 2011-10-02 NOTE — Progress Notes (Signed)
Addended by: Priscella Mann J on: 10/02/2011 04:14 PM   Modules accepted: Orders, Level of Service

## 2011-10-02 NOTE — Progress Notes (Signed)
Addended by: Madolyn Frieze, Marylene Land J on: 10/02/2011 02:22 PM   Modules accepted: Orders

## 2011-10-02 NOTE — Patient Instructions (Addendum)
I think you may have a bacterial or a viral infection. Try the antibiotic for 5 days.  For discomfort/fever/muscle aches, try ibuprofen or Tylenol.  For your cough, try the cough medication at bedtime. It may make you sleepy. Try also the allergy medication to help with your congestion.   Follow-up in 1 week if you are not feeling better.

## 2011-10-04 ENCOUNTER — Ambulatory Visit
Admission: RE | Admit: 2011-10-04 | Discharge: 2011-10-04 | Disposition: A | Discharge status: Home / Self Care | Payer: Medicare Other | Source: Ambulatory Visit | Attending: Family Medicine | Admitting: Family Medicine

## 2011-10-04 DIAGNOSIS — E042 Nontoxic multinodular goiter: Secondary | ICD-10-CM | POA: Diagnosis not present

## 2011-10-04 DIAGNOSIS — E049 Nontoxic goiter, unspecified: Secondary | ICD-10-CM

## 2011-11-05 ENCOUNTER — Telehealth: Payer: Self-pay | Admitting: Family Medicine

## 2011-11-05 NOTE — Telephone Encounter (Signed)
Kendra Hall want to have referral and appt set up for a gyn specialist.  Having periods twice monthly and want to know why this is happening.  Not getting any resolution from primary.

## 2011-11-05 NOTE — Telephone Encounter (Signed)
I have checked a pelvic ultrasound that was negative last month. I previously advised her to follow up in clinic to discuss treatment options if this did not improve, and she has not done this. Please have her schedule a follow up visit.

## 2011-11-05 NOTE — Telephone Encounter (Signed)
Pt has appt for 5.17.2013.Loralee Pacas [place]

## 2011-11-08 ENCOUNTER — Encounter: Payer: Self-pay | Admitting: Family Medicine

## 2011-11-08 ENCOUNTER — Ambulatory Visit (INDEPENDENT_AMBULATORY_CARE_PROVIDER_SITE_OTHER): Payer: Medicare Other | Admitting: Family Medicine

## 2011-11-08 VITALS — BP 118/72 | HR 78 | Temp 98.7°F | Ht 63.0 in | Wt 199.0 lb

## 2011-11-08 DIAGNOSIS — Z209 Contact with and (suspected) exposure to unspecified communicable disease: Secondary | ICD-10-CM

## 2011-11-08 DIAGNOSIS — J019 Acute sinusitis, unspecified: Secondary | ICD-10-CM | POA: Diagnosis not present

## 2011-11-08 DIAGNOSIS — N920 Excessive and frequent menstruation with regular cycle: Secondary | ICD-10-CM

## 2011-11-08 DIAGNOSIS — E119 Type 2 diabetes mellitus without complications: Secondary | ICD-10-CM

## 2011-11-08 DIAGNOSIS — N921 Excessive and frequent menstruation with irregular cycle: Secondary | ICD-10-CM

## 2011-11-08 DIAGNOSIS — E890 Postprocedural hypothyroidism: Secondary | ICD-10-CM

## 2011-11-08 LAB — POCT GLYCOSYLATED HEMOGLOBIN (HGB A1C): Hemoglobin A1C: 6.7

## 2011-11-08 LAB — LDL CHOLESTEROL, DIRECT: Direct LDL: 60 mg/dL

## 2011-11-08 LAB — T4, FREE: Free T4: 0.55 ng/dL — ABNORMAL LOW (ref 0.80–1.80)

## 2011-11-08 LAB — TSH: TSH: 44.679 u[IU]/mL — ABNORMAL HIGH (ref 0.350–4.500)

## 2011-11-08 MED ORDER — FEXOFENADINE HCL 30 MG PO TABS: 30.0000 mg | ORAL_TABLET | Freq: Two times a day (BID) | ORAL | Status: DC

## 2011-11-08 MED ORDER — MEDROXYPROGESTERONE ACETATE 5 MG PO TABS: 5.0000 mg | ORAL_TABLET | Freq: Every day | ORAL | Status: DC

## 2011-11-08 NOTE — Patient Instructions (Signed)
Irregular bleeding may be due to thyroid or perimenopausal, low liklihood of cancer. Try to take provera next 10 days. Make an appointment in next 2-4 weeks for endometrial biopsy. You have a viral sinus infection. Use allegra and OTC decongestants. If you have fever, facial pain or worsen then return to care. I will call if your lab tests are abnormal.  Upper Respiratory Infection, Adult An upper respiratory infection (URI) is also known as the common cold. It is often caused by a type of germ (virus). Colds are easily spread (contagious). You can pass it to others by kissing, coughing, sneezing, or drinking out of the same glass. Usually, you get better in 1 or 2 weeks.  HOME CARE   Only take medicine as told by your doctor.   Use a warm mist humidifier or breathe in steam from a hot shower.   Drink enough water and fluids to keep your pee (urine) clear or pale yellow.   Get plenty of rest.   Return to work when your temperature is back to normal or as told by your doctor. You may use a face mask and wash your hands to stop your cold from spreading.  GET HELP RIGHT AWAY IF:   After the first few days, you feel you are getting worse.   You have questions about your medicine.   You have chills, shortness of breath, or brown or red spit (mucus).   You have yellow or brown snot (nasal discharge) or pain in the face, especially when you bend forward.   You have a fever, puffy (swollen) neck, pain when you swallow, or white spots in the back of your throat.   You have a bad headache, ear pain, sinus pain, or chest pain.   You have a high-pitched whistling sound when you breathe in and out (wheezing).   You have a lasting cough or cough up blood.   You have sore muscles or a stiff neck.  MAKE SURE YOU:   Understand these instructions.   Will watch your condition.   Will get help right away if you are not doing well or get worse.  Document Released: 11/27/2007 Document  Revised: 05/30/2011 Document Reviewed: 10/15/2010 [facility] Patient Information 2012 [place], [place].

## 2011-11-09 LAB — CBC
HCT: 36.6 % (ref 36.0–46.0)
Hemoglobin: 12.1 g/dL (ref 12.0–15.0)
MCH: 26.1 pg (ref 26.0–34.0)
MCHC: 33.1 g/dL (ref 30.0–36.0)
MCV: 79 fL (ref 78.0–100.0)
Platelets: 335 10*3/uL (ref 150–400)
RBC: 4.63 MIL/uL (ref 3.87–5.11)
RDW: 15.9 % — ABNORMAL HIGH (ref 11.5–15.5)
WBC: 6.2 10*3/uL (ref 4.0–10.5)

## 2011-11-10 ENCOUNTER — Telehealth: Payer: Self-pay | Admitting: Family Medicine

## 2011-11-10 DIAGNOSIS — E039 Hypothyroidism, unspecified: Secondary | ICD-10-CM

## 2011-11-10 MED ORDER — CETIRIZINE HCL 10 MG PO TABS: 10.0000 mg | ORAL_TABLET | Freq: Every day | ORAL | Status: DC

## 2011-11-10 MED ORDER — LEVOTHYROXINE SODIUM 125 MCG PO TABS: 125.0000 ug | ORAL_TABLET | Freq: Every day | ORAL | Status: DC

## 2011-11-10 NOTE — Telephone Encounter (Signed)
Patient called back. We discussed her synthroid-states she skipped this medication 3 times last week, however taking it daily prior to this. Will send increased dose 125 mcg synthroid. Also incorrect dose of antihistamine sent-childrens allegra-will correct this. Cough improved so far. Patient to f/u in 2 weeks.

## 2011-11-10 NOTE — Progress Notes (Signed)
  Subjective:    Patient ID: Kendra Hall, female    DOB: 10-15-1965, 46 y.o.   MRN: [id number]  HPI  1. Menometrorrhagia. F/u irregular menstrual cycles. Patient continues to have ~ 2 periods per month, last bleeding lasted 8 days (5/1-5/8), was heavier than usual, then stopped few days, now has started light period 3 days ago. We reviewed results for pelvic US showing normal stripe and no anatomical abnormalities. Patient denies any abdominal pain, cramping, pelvic pain, discharge, syncope, falls. She is fatigued, but believes this is due to recent viral URI symptoms.  2. Hypothyroidism. S/p radio-iodine treatment for Graves. No sign of nodule on repeat thyroid US. States she is taking her levothyroxine daily. Denies large change in weight, palpitations, diarrhea, sweating. She does have intermittent constipation.  3. Cold symptoms. For past 5 days having sinus congestion, chills, feeling of right ear clogged, watery eyes, myalgias, fatigue, nonproductive cough. Slight improvement today. Denies any productive sputum, fevers, emesis, dyspnea, facial pain. Is taking OTC decongestant. Not using her albuterol regularly-not needed. Still smoking cigarettes.  Review of Systems See HPI otherwise negative.  reports that she has been smoking Cigarettes.  She has been smoking about .3 packs per day. She does not have any smokeless tobacco history on file.    Objective:   Physical Exam  Vitals reviewed. Constitutional: She is oriented to person, place, and time. She appears well-developed and well-nourished. No distress.       Appears fatigued.  HENT:  Head: Normocephalic and atraumatic.       Bilateral serous fluid posterior to TM. Normal landmarks, no erythema or dullness. Normal canals. No sinus TTP. Eyes watery.  Eyes: Conjunctivae and EOM are normal. Pupils are equal, round, and reactive to light.  Neck: Neck supple. No tracheal deviation present. No thyromegaly present.  Cardiovascular:  Normal rate, regular rhythm and normal heart sounds.   No murmur heard. Pulmonary/Chest: Effort normal and breath sounds normal. No stridor. No respiratory distress. She has no wheezes. She has no rales.  Abdominal: Soft.  Musculoskeletal: She exhibits no edema and no tenderness.  Lymphadenopathy:    She has no cervical adenopathy.  Neurological: She is alert and oriented to person, place, and time. She exhibits normal muscle tone. Coordination normal.  Skin: No rash noted.  Psychiatric: Thought content normal.       Assessment & Plan:

## 2011-11-10 NOTE — Telephone Encounter (Signed)
Unsure if patient phone number is correct. Left message, would like to discuss results. Thyroid level is too low, needs to increase her thyroid dose if she is taking this medication daily (need to confirm if she is taking daily, as she if frequently noncompliant).

## 2011-11-10 NOTE — Assessment & Plan Note (Signed)
A1c improved. Continue metformin daily. Will discuss yearly eye exam and foot exams once current medical issues more stable.

## 2011-11-10 NOTE — Assessment & Plan Note (Signed)
Last TFTs were wnl, now showing hypothyroidism and likely the etiology of menstrual irregularity. Will check CBC, short course of provera to stabilize endometrium and current bleeding. Attempt improved control at thyroid condition and f/u symptoms. [place] negative, will hold on performing endometrial biopsy unless resolution of endocrine issues doesn't improve her symptoms.

## 2011-11-10 NOTE — Assessment & Plan Note (Signed)
TSH and free T4 c/w undertreatment, likely the explanation for menometrorrhagia. Not entirely sure patient is compliant with medication daily. Will attempt to quantify her current compliance and increase dose at least to daily if she actually takes med daily. F/u labs in 6-8 weeks.

## 2011-11-10 NOTE — Assessment & Plan Note (Signed)
No sign bacterial etiology (fever, sinus tenderness). Recommend continuation of symptom treatment OTC decongestant since she has already improved some, with addition of antihistamine. F/u for worsening, fever or dyspnea-we discussed smoking as a risk factor for development of bacterial infection.

## 2011-11-11 ENCOUNTER — Ambulatory Visit: Payer: Medicare Other | Admitting: Home Health Services

## 2011-11-19 ENCOUNTER — Encounter: Payer: Self-pay | Admitting: Family Medicine

## 2011-11-19 ENCOUNTER — Telehealth: Payer: Self-pay | Admitting: Family Medicine

## 2011-11-19 ENCOUNTER — Ambulatory Visit (INDEPENDENT_AMBULATORY_CARE_PROVIDER_SITE_OTHER): Payer: Medicare Other | Admitting: Family Medicine

## 2011-11-19 VITALS — BP 142/87 | HR 85 | Temp 98.5°F | Wt 205.0 lb

## 2011-11-19 DIAGNOSIS — J019 Acute sinusitis, unspecified: Secondary | ICD-10-CM | POA: Diagnosis not present

## 2011-11-19 MED ORDER — BENZONATATE 100 MG PO CAPS: 100.0000 mg | ORAL_CAPSULE | Freq: Two times a day (BID) | ORAL | Status: DC | PRN

## 2011-11-19 MED ORDER — AZITHROMYCIN 500 MG PO TABS: ORAL_TABLET | ORAL | Status: AC

## 2011-11-19 NOTE — Progress Notes (Signed)
  Subjective:    Patient ID: Kendra Hall, female    DOB: 05-Apr-1966, 46 y.o.   MRN: [id number]  HPI Patient reports sinus pain x2 weeks: Seen initially on 5/17- was told this is most likely allergic or viral sinusitis. Patient states that symptoms have continued. Continued frontal sinus pain as well as maxillary sinus pain. Positive runny nose. Positive cough. Positive body aches. Unsure if has had fever or not-states she has not checked. Patient is taking Zyrtec daily. States she cannot take Flonase cause it makes her cough until she throws up. No diarrhea. No abdominal pain. No nausea. No vomiting. Positive eye watering. Positive headache off and on. Has history of seasonal allergies. Positive rhinorrhea. Positive aching in right upper gum area.    Review of Systems As per above.   Objective:   Physical Exam  Constitutional: She appears well-developed and well-nourished.  HENT:  Head: Normocephalic and atraumatic.       + tenderness with palpation over maxillary sinus area bilateral.  Mild tenderness with palpation of right frontal gum area-no signs of infection, no redness, no fluctuance of gums.   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   Eyes: Pupils are equal, round, and reactive to light. Right eye exhibits no discharge. Left eye exhibits no discharge.  Cardiovascular: Normal rate, regular rhythm and normal heart sounds.   No murmur heard. Pulmonary/Chest: Effort normal. No respiratory distress. She has no wheezes. She has no rales. She exhibits no tenderness.  Abdominal: Soft. She exhibits no distension.  Musculoskeletal: She exhibits no edema.  Neurological: She is alert.  Skin: No rash noted.  Psychiatric: She has a normal mood and affect. Her behavior is normal.          Assessment & Plan:

## 2011-11-19 NOTE — Telephone Encounter (Signed)
Patient is coming in to see Dr. Berline Chough for her gum and teeth pain.  She would also like a refill on Tessalon sent to [facility] on [address].

## 2011-11-20 ENCOUNTER — Ambulatory Visit: Payer: Medicare Other | Admitting: Sports Medicine

## 2011-11-21 NOTE — Assessment & Plan Note (Addendum)
Most likely due to viral or allergic causes- but since pt has had symptoms x 2 weeks, with possible subjective fever, and worsening of pain -- will treat with antibiotics at this time.  Pt to return in 1-2 weeks if no improvement or sooner if new or worsening of symptoms. Pt state she doesn't like nasal steroid- but if no improvement with antibiotic- most likely will need to restart nasal steroid since allergic rhinitis is the most likely cause.  Pt to continue zyrtec as she is taking daily.  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  Pt states she no longer uses cocaine.  Patient to f/up with PCP regarding elevated bp- 142/87 today.  Gave refill on tessalon [place] per pt request for continued cough.

## 2011-11-22 ENCOUNTER — Other Ambulatory Visit: Payer: Self-pay | Admitting: Family Medicine

## 2011-12-03 ENCOUNTER — Encounter: Payer: Self-pay | Admitting: Family Medicine

## 2011-12-03 ENCOUNTER — Ambulatory Visit (INDEPENDENT_AMBULATORY_CARE_PROVIDER_SITE_OTHER): Payer: Medicare Other | Admitting: Family Medicine

## 2011-12-03 VITALS — BP 138/96 | Temp 98.3°F | Wt 196.0 lb

## 2011-12-03 DIAGNOSIS — E890 Postprocedural hypothyroidism: Secondary | ICD-10-CM | POA: Diagnosis not present

## 2011-12-03 DIAGNOSIS — F329 Major depressive disorder, single episode, unspecified: Secondary | ICD-10-CM | POA: Diagnosis not present

## 2011-12-03 DIAGNOSIS — F3289 Other specified depressive episodes: Secondary | ICD-10-CM

## 2011-12-03 DIAGNOSIS — M545 Low back pain, unspecified: Secondary | ICD-10-CM

## 2011-12-03 DIAGNOSIS — N920 Excessive and frequent menstruation with regular cycle: Secondary | ICD-10-CM | POA: Diagnosis not present

## 2011-12-03 DIAGNOSIS — H538 Other visual disturbances: Secondary | ICD-10-CM

## 2011-12-03 DIAGNOSIS — N921 Excessive and frequent menstruation with irregular cycle: Secondary | ICD-10-CM

## 2011-12-03 MED ORDER — OXYCODONE-ACETAMINOPHEN 5-325 MG PO TABS: 1.0000 | ORAL_TABLET | Freq: Every day | ORAL | Status: DC | PRN

## 2011-12-03 NOTE — Patient Instructions (Signed)
Improvement that your bleeding is more cyclical now. Lets watch another month to see if it continues improving. Will check thyroid tests in one month. You may use flexeril or percocet for your chronic back pain. Try to use only tylenol if you can. Try to start doing back exercises to stretch and strengthen muscles. Referral to eye doctor today.  Back Exercises Back exercises help treat and prevent back injuries. The goal of back exercises is to increase the strength of your abdominal and back muscles and the flexibility of your back. These exercises should be started when you no longer have back pain. Back exercises include:  Pelvic Tilt. Lie on your back with your knees bent. Tilt your pelvis until the lower part of your back is against the floor. Hold this position 5 to 10 sec and repeat 5 to 10 times.   Knee to Chest. Pull first 1 knee up against your chest and hold for 20 to 30 seconds, repeat this with the other knee, and then both knees. This may be done with the other leg straight or bent, whichever feels better.   Sit-Ups or Curl-Ups. Bend your knees 90 degrees. Start with tilting your pelvis, and do a partial, slow sit-up, lifting your trunk only 30 to 45 degrees off the floor. Take at least 2 to 3 seconds for each sit-up. Do not do sit-ups with your knees out straight. If partial sit-ups are difficult, simply do the above but with only tightening your abdominal muscles and holding it as directed.   Hip-Lift. Lie on your back with your knees flexed 90 degrees. Push down with your feet and shoulders as you raise your hips a couple inches off the floor; hold for 10 seconds, repeat 5 to 10 times.   Back arches. Lie on your stomach, propping yourself up on bent elbows. Slowly press on your hands, causing an arch in your low back. Repeat 3 to 5 times. Any initial stiffness and discomfort should lessen with repetition over time.   Shoulder-Lifts. Lie face down with arms beside your body. Keep  hips and torso pressed to floor as you slowly lift your head and shoulders off the floor.  Do not overdo your exercises, especially in the beginning. Exercises may cause you some mild back discomfort which lasts for a few minutes; however, if the pain is more severe, or lasts for more than 15 minutes, do not continue exercises until you see your caregiver. Improvement with exercise therapy for back problems is slow.  See your caregivers for assistance with developing a proper back exercise program. Document Released: 07/18/2004 Document Revised: 05/30/2011 Document Reviewed: 06/10/2005 [facility] Patient Information 2012 [place], [place].

## 2011-12-04 NOTE — Assessment & Plan Note (Signed)
Symptoms stable. Continue on 125 mcg synthroid. Check TSH in 4-6 weeks.

## 2011-12-04 NOTE — Assessment & Plan Note (Signed)
Patient unable to find ophthalmologist contact info from previous. Will make new referral for new problem of blurred vision.

## 2011-12-04 NOTE — Assessment & Plan Note (Signed)
Stable currently. If decompensates in the future, may try TCA or SNRI as pain adjunct.

## 2011-12-04 NOTE — Assessment & Plan Note (Signed)
No changes in pain quality, some strain/spasm present currently. Will refill percocet to use for exacerbations-infrequently as last refill of #15 tabs was 4 months ago. Patient has few flexeril at home to try in addition. This seems to improve her functionality. Counseled to start stretching and strengthening exercises-written instructions given.

## 2011-12-04 NOTE — Assessment & Plan Note (Signed)
Seems to have returned to monthly interval, continues with heavier bleeding. No anemia last check. Will continue to observe while thyroid medication is being titrated. Consider endometrial biopsy if irregularities persist.
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
## 2011-12-10 DIAGNOSIS — E119 Type 2 diabetes mellitus without complications: Secondary | ICD-10-CM | POA: Diagnosis not present

## 2012-01-06 ENCOUNTER — Ambulatory Visit (INDEPENDENT_AMBULATORY_CARE_PROVIDER_SITE_OTHER): Payer: Medicare Other | Admitting: Family Medicine

## 2012-01-06 ENCOUNTER — Encounter: Payer: Self-pay | Admitting: Family Medicine

## 2012-01-06 VITALS — BP 145/76 | HR 79 | Ht 63.0 in | Wt 201.0 lb

## 2012-01-06 DIAGNOSIS — M5380 Other specified dorsopathies, site unspecified: Secondary | ICD-10-CM | POA: Diagnosis not present

## 2012-01-06 DIAGNOSIS — E039 Hypothyroidism, unspecified: Secondary | ICD-10-CM

## 2012-01-06 DIAGNOSIS — I10 Essential (primary) hypertension: Secondary | ICD-10-CM

## 2012-01-06 DIAGNOSIS — M62830 Muscle spasm of back: Secondary | ICD-10-CM | POA: Insufficient documentation

## 2012-01-06 DIAGNOSIS — E890 Postprocedural hypothyroidism: Secondary | ICD-10-CM

## 2012-01-06 MED ORDER — NAPROXEN 500 MG PO TABS: 500.0000 mg | ORAL_TABLET | Freq: Two times a day (BID) | ORAL | Status: DC

## 2012-01-06 MED ORDER — CYCLOBENZAPRINE HCL 5 MG PO TABS: 5.0000 mg | ORAL_TABLET | Freq: Every evening | ORAL | Status: AC | PRN

## 2012-01-06 NOTE — Patient Instructions (Addendum)
You have multiple spasms in your back. You can help with stretching, heat pads, naproxen for the next 3-5 days, flexeril at night. Make an appointment in one month. i will call if we change your thyroid medicine. Get in the pool or walk for exercise one hour daily.  Back Pain, Adult Back pain is very common. The pain often gets better over time. The cause of back pain is usually not dangerous. Most people can learn to manage their back pain on their own.  HOME CARE   Stay active. Start with short walks on flat ground if you can. Try to walk farther each day.   Do not sit, drive, or stand in one place for more than 30 minutes. Do not stay in bed.   Do not avoid exercise or work. Activity can help your back heal faster.   Be careful when you bend or lift an object. Bend at your knees, keep the object close to you, and do not twist.   Sleep on a firm mattress. Lie on your side, and bend your knees. If you lie on your back, put a pillow under your knees.   Only take medicines as told by your doctor.   Put ice on the injured area.   Put ice in a plastic bag.   Place a towel between your skin and the bag.   Leave the ice on for 15 to 20 minutes, 3 to 4 times a day for the first 2 to 3 days. After that, you can switch between ice and heat packs.   Ask your doctor about back exercises or massage.   Avoid feeling anxious or stressed. Find good ways to deal with stress, such as exercise.  GET HELP RIGHT AWAY IF:   Your pain does not go away with rest or medicine.   Your pain does not go away in 1 week.   You have new problems.   You do not feel well.   The pain spreads into your legs.   You cannot control when you poop (bowel movement) or pee (urinate).   Your arms or legs feel weak or lose feeling (numbness).   You feel sick to your stomach (nauseous) or throw up (vomit).   You have belly (abdominal) pain.   You feel like you may pass out (faint).  MAKE SURE YOU:    Understand these instructions.   Will watch your condition.   Will get help right away if you are not doing well or get worse.  Document Released: 11/27/2007 Document Revised: 05/30/2011 Document Reviewed: 10/29/2010 [facility] Patient Information 2012 [place], [place].

## 2012-01-07 DIAGNOSIS — I10 Essential (primary) hypertension: Secondary | ICD-10-CM | POA: Insufficient documentation

## 2012-01-07 LAB — TSH: TSH: 24.154 u[IU]/mL — ABNORMAL HIGH (ref 0.350–4.500)

## 2012-01-07 MED ORDER — LEVOTHYROXINE SODIUM 150 MCG PO TABS: 150.0000 ug | ORAL_TABLET | Freq: Every day | ORAL | Status: DC

## 2012-01-07 NOTE — Assessment & Plan Note (Signed)
For recheck of TSH is still significantly elevated. Will increase the dose today. Recheck in 4-6 weeks.

## 2012-01-07 NOTE — Assessment & Plan Note (Signed)
Multiple sites of muscle spasm. No red flags for inflammatory myalgias including fever, weight loss. Discussed with patient preventative strategies including back strengthening and suggested physical therapy again the patient is unable to afford this. Recommended regular daily exercise and weight loss. Will treat short term with naproxen and Flexeril. Followup in one month. Systems and given her chronic multiple pain complaints, may consider trial of neuropathic pain treatment with complement and his depression most likely, may consider tricyclics or Cymbalta. No narcotics prescribed.

## 2012-01-07 NOTE — Assessment & Plan Note (Signed)
Remains at goal less than 140/90 but borderline. Follow next visit. Patient noncompliant with lisinopril.

## 2012-01-07 NOTE — Progress Notes (Signed)
  Subjective:    Patient ID: Kendra Hall, female    DOB: August 12, 1965, 46 y.o.   MRN: [id number]  HPI  1. Back, hip pains. For the past several months, patient endorses a right upper back sharp pain that occurs randomly. This sometimes radiates to her right shoulder. She also notes a right buttock similar spastic-type pain. She has not been exercising regularly. Pain is not exertional. Pain is worse with position changes and at nighttime. Has not tried any medication therapy. Previously completed physical therapy for low back pain.  She denies any chest pain, palpitation, dyspnea, leg swelling, injury, trauma, fever, rash.  2. hypothyroidism. Denies any weight fluctuations. Is taking higher dose of levothyroxin 125 mcg. Has only missed two tablets in past month. Denies any palpitations, anxiety, rash, bowel changes.  3. hypertension. Patient endorses some values of 130s/70s. Not taking lisinopril.   Review of Systems See HPI otherwise negative.  reports that she has been smoking Cigarettes.  She has been smoking about .3 packs per day. She does not have any smokeless tobacco history on file.    Objective:   Physical Exam  Vitals reviewed. Constitutional: She is oriented to person, place, and time. She appears well-developed and well-nourished. No distress.  HENT:  Head: Normocephalic and atraumatic.  Mouth/Throat: Oropharynx is clear and moist.  Eyes: EOM are normal. Pupils are equal, round, and reactive to light.  Neck: Neck supple.  Cardiovascular: Normal rate, regular rhythm, normal heart sounds and intact distal pulses.   No murmur heard. Pulmonary/Chest: Effort normal and breath sounds normal. No respiratory distress. She has no wheezes. She has no rales.  Abdominal: Soft. She exhibits no distension. There is no tenderness. There is no rebound and no guarding.  Musculoskeletal: She exhibits tenderness. She exhibits no edema.       Tenderness to palpation in left lower paraspinal  musculature with hypertonicity. Right thoracic paraspinal musculature is tender palpation. Right upper sterno- clavicular border tender to palpation.  Neurological: She is alert and oriented to person, place, and time. No cranial nerve deficit. Coordination normal.  Skin: No rash noted.  Psychiatric: Her behavior is normal. Thought content normal.       Flat affect        Assessment & Plan:

## 2012-01-10 ENCOUNTER — Encounter (HOSPITAL_COMMUNITY): Payer: Self-pay | Admitting: Emergency Medicine

## 2012-01-10 ENCOUNTER — Emergency Department (HOSPITAL_COMMUNITY): Payer: Medicare Other

## 2012-01-10 ENCOUNTER — Emergency Department (HOSPITAL_COMMUNITY)
Admission: EM | Admit: 2012-01-10 | Discharge: 2012-01-11 | Disposition: A | Discharge status: Home / Self Care | Payer: Medicare Other | Attending: Emergency Medicine | Admitting: Emergency Medicine

## 2012-01-10 ENCOUNTER — Emergency Department (INDEPENDENT_AMBULATORY_CARE_PROVIDER_SITE_OTHER)
Admission: EM | Admit: 2012-01-10 | Discharge: 2012-01-10 | Disposition: A | Discharge status: Nursing Home (Includes ICF) | Payer: Medicare Other | Source: Home / Self Care | Attending: Family Medicine | Admitting: Family Medicine

## 2012-01-10 DIAGNOSIS — F329 Major depressive disorder, single episode, unspecified: Secondary | ICD-10-CM | POA: Diagnosis not present

## 2012-01-10 DIAGNOSIS — M546 Pain in thoracic spine: Secondary | ICD-10-CM | POA: Diagnosis not present

## 2012-01-10 DIAGNOSIS — R0789 Other chest pain: Secondary | ICD-10-CM | POA: Insufficient documentation

## 2012-01-10 DIAGNOSIS — R072 Precordial pain: Secondary | ICD-10-CM | POA: Diagnosis not present

## 2012-01-10 DIAGNOSIS — F411 Generalized anxiety disorder: Secondary | ICD-10-CM | POA: Diagnosis not present

## 2012-01-10 DIAGNOSIS — R079 Chest pain, unspecified: Secondary | ICD-10-CM | POA: Diagnosis not present

## 2012-01-10 DIAGNOSIS — R0602 Shortness of breath: Secondary | ICD-10-CM | POA: Diagnosis not present

## 2012-01-10 DIAGNOSIS — R110 Nausea: Secondary | ICD-10-CM | POA: Insufficient documentation

## 2012-01-10 DIAGNOSIS — Z9119 Patient's noncompliance with other medical treatment and regimen: Secondary | ICD-10-CM | POA: Diagnosis not present

## 2012-01-10 DIAGNOSIS — M1990 Unspecified osteoarthritis, unspecified site: Secondary | ICD-10-CM | POA: Diagnosis not present

## 2012-01-10 DIAGNOSIS — E119 Type 2 diabetes mellitus without complications: Secondary | ICD-10-CM

## 2012-01-10 DIAGNOSIS — I10 Essential (primary) hypertension: Secondary | ICD-10-CM | POA: Insufficient documentation

## 2012-01-10 DIAGNOSIS — F17200 Nicotine dependence, unspecified, uncomplicated: Secondary | ICD-10-CM | POA: Diagnosis not present

## 2012-01-10 DIAGNOSIS — F3289 Other specified depressive episodes: Secondary | ICD-10-CM | POA: Insufficient documentation

## 2012-01-10 DIAGNOSIS — Z91199 Patient's noncompliance with other medical treatment and regimen due to unspecified reason: Secondary | ICD-10-CM | POA: Insufficient documentation

## 2012-01-10 LAB — URINE MICROSCOPIC-ADD ON

## 2012-01-10 LAB — CBC
HCT: 36.2 % (ref 36.0–46.0)
Hemoglobin: 11.8 g/dL — ABNORMAL LOW (ref 12.0–15.0)
MCH: 26.2 pg (ref 26.0–34.0)
MCHC: 32.6 g/dL (ref 30.0–36.0)
MCV: 80.3 fL (ref 78.0–100.0)
Platelets: 296 10*3/uL (ref 150–400)
RBC: 4.51 MIL/uL (ref 3.87–5.11)
RDW: 13.9 % (ref 11.5–15.5)
WBC: 6.2 10*3/uL (ref 4.0–10.5)

## 2012-01-10 LAB — COMPREHENSIVE METABOLIC PANEL
ALT: 10 U/L (ref 0–35)
AST: 17 U/L (ref 0–37)
Albumin: 3.8 g/dL (ref 3.5–5.2)
Alkaline Phosphatase: 95 U/L (ref 39–117)
BUN: 12 mg/dL (ref 6–23)
CO2: 26 mEq/L (ref 19–32)
Calcium: 9.1 mg/dL (ref 8.4–10.5)
Chloride: 103 mEq/L (ref 96–112)
Creatinine, Ser: 1 mg/dL (ref 0.50–1.10)
GFR calc Af Amer: 77 mL/min — ABNORMAL LOW (ref >90)
GFR calc non Af Amer: 66 mL/min — ABNORMAL LOW (ref >90)
Glucose, Bld: 79 mg/dL (ref 70–99)
Potassium: 4.2 mEq/L (ref 3.5–5.1)
Sodium: 142 mEq/L (ref 135–145)
Total Bilirubin: 0.3 mg/dL (ref 0.3–1.2)
Total Protein: 7.7 g/dL (ref 6.0–8.3)

## 2012-01-10 LAB — URINALYSIS, ROUTINE W REFLEX MICROSCOPIC
Bilirubin Urine: NEGATIVE
Glucose, UA: NEGATIVE mg/dL
Hgb urine dipstick: NEGATIVE
Ketones, ur: NEGATIVE mg/dL
Nitrite: NEGATIVE
Protein, ur: NEGATIVE mg/dL
Specific Gravity, Urine: 1.023 (ref 1.005–1.030)
Urobilinogen, UA: 1 mg/dL (ref 0.0–1.0)
pH: 7 (ref 5.0–8.0)

## 2012-01-10 LAB — POCT I-STAT TROPONIN I
Troponin i, poc: 0 ng/mL (ref 0.00–0.08)
Troponin i, poc: 0 ng/mL (ref 0.00–0.08)

## 2012-01-10 LAB — LIPASE, BLOOD: Lipase: 24 U/L (ref 11–59)

## 2012-01-10 LAB — GLUCOSE, CAPILLARY: Glucose-Capillary: 129 mg/dL — ABNORMAL HIGH (ref 70–99)

## 2012-01-10 MED ORDER — SUMATRIPTAN SUCCINATE 50 MG PO TABS
50.0000 mg | ORAL_TABLET | ORAL | Status: DC | PRN
  Filled 2012-01-10: qty 1

## 2012-01-10 MED ORDER — NITROGLYCERIN 0.4 MG SL SUBL: 0.4000 mg | SUBLINGUAL_TABLET | SUBLINGUAL | Status: DC | PRN

## 2012-01-10 MED ORDER — ONDANSETRON HCL 4 MG/2ML IJ SOLN
4.0000 mg | Freq: Once | INTRAMUSCULAR | Status: AC
  Administered 2012-01-10: 4 mg via INTRAVENOUS

## 2012-01-10 MED ORDER — SODIUM CHLORIDE 0.9 % IV SOLN
Freq: Once | INTRAVENOUS | Status: AC
  Administered 2012-01-10: 13:00:00 via INTRAVENOUS

## 2012-01-10 MED ORDER — ASPIRIN 81 MG PO CHEW
CHEWABLE_TABLET | ORAL | Status: AC
  Filled 2012-01-10: qty 4

## 2012-01-10 MED ORDER — MORPHINE SULFATE 4 MG/ML IJ SOLN
4.0000 mg | Freq: Once | INTRAMUSCULAR | Status: AC
  Administered 2012-01-10: 4 mg via INTRAVENOUS
  Filled 2012-01-10: qty 1

## 2012-01-10 MED ORDER — METHOCARBAMOL 500 MG PO TABS: 500.0000 mg | ORAL_TABLET | Freq: Two times a day (BID) | ORAL | Status: DC

## 2012-01-10 MED ORDER — PROMETHAZINE HCL 25 MG/ML IJ SOLN
12.5000 mg | Freq: Once | INTRAMUSCULAR | Status: AC
  Administered 2012-01-10: 12.5 mg via INTRAVENOUS
  Filled 2012-01-10: qty 1

## 2012-01-10 MED ORDER — ONDANSETRON HCL 4 MG/2ML IJ SOLN
4.0000 mg | Freq: Once | INTRAMUSCULAR | Status: AC
  Administered 2012-01-10: 4 mg via INTRAVENOUS
  Filled 2012-01-10: qty 2

## 2012-01-10 MED ORDER — ONDANSETRON HCL 4 MG/2ML IJ SOLN
INTRAMUSCULAR | Status: AC
  Filled 2012-01-10: qty 2

## 2012-01-10 MED ORDER — SUMATRIPTAN SUCCINATE 6 MG/0.5ML ~~LOC~~ SOLN
6.0000 mg | Freq: Once | SUBCUTANEOUS | Status: DC
  Filled 2012-01-10: qty 0.5

## 2012-01-10 MED ORDER — IBUPROFEN 800 MG PO TABS: 800.0000 mg | ORAL_TABLET | Freq: Three times a day (TID) | ORAL | Status: DC

## 2012-01-10 MED ORDER — MORPHINE SULFATE 2 MG/ML IJ SOLN
2.0000 mg | Freq: Once | INTRAMUSCULAR | Status: AC
  Administered 2012-01-10: 2 mg via INTRAVENOUS

## 2012-01-10 MED ORDER — HYDROMORPHONE HCL PF 1 MG/ML IJ SOLN
1.0000 mg | Freq: Once | INTRAMUSCULAR | Status: AC
  Administered 2012-01-10: 1 mg via INTRAVENOUS
  Filled 2012-01-10: qty 1

## 2012-01-10 MED ORDER — SUMATRIPTAN SUCCINATE 6 MG/0.5ML ~~LOC~~ SOLN: 6.0000 mg | Freq: Once | SUBCUTANEOUS | Status: DC

## 2012-01-10 MED ORDER — MORPHINE SULFATE 2 MG/ML IJ SOLN
INTRAMUSCULAR | Status: AC
  Filled 2012-01-10: qty 1

## 2012-01-10 MED ORDER — ASPIRIN 325 MG PO TABS
325.0000 mg | ORAL_TABLET | Freq: Once | ORAL | Status: AC
  Administered 2012-01-10: 325 mg via ORAL

## 2012-01-10 MED ORDER — ALBUTEROL SULFATE HFA 108 (90 BASE) MCG/ACT IN AERS: 2.0000 | INHALATION_SPRAY | Freq: Four times a day (QID) | RESPIRATORY_TRACT | Status: DC | PRN

## 2012-01-10 MED ORDER — ASPIRIN 325 MG PO TABS: 325.0000 mg | ORAL_TABLET | ORAL | Status: DC

## 2012-01-10 MED ORDER — IOHEXOL 350 MG/ML SOLN
100.0000 mL | Freq: Once | INTRAVENOUS | Status: AC | PRN
  Administered 2012-01-10: 100 mL via INTRAVENOUS

## 2012-01-10 NOTE — ED Provider Notes (Signed)
History     CSN: [id number]  Arrival date & time 01/10/12  1355   None     Chief Complaint  Patient presents with  . Chest Pain    (Consider location/radiation/quality/duration/timing/severity/associated sxs/prior treatment) HPI  46 year old female appears to be in acute distress, crying, vital signs stable complaining of this sternal chest pain since being awoken by the pain this morning at 10:30. Pain is slightly to the right of the sternum. Severe at 10/10, non radiating, described as pressure like, with intermittent sharp pain.  No associated shortness of breath, however patient states when pain is severe she does feel short of breath. Pain is nonexertional, exacerbated by movement and deep breathes. Denies trauma.Patient states she has no cardiac history (no cath's or stress tests) she was diagnosed with diabetes but her doctor has taken her off of all diabetes and HTN medications is her sugars are well controlled. Notes as per Dr. Cristal Ford states she is non-compliant.  She is a current smoker. Denies FH of early cardiac death.  Pt also reports right sided thoracic back pain starting yesterday. Pt seen at [facility] and given morphine 4mg  with ASA 325mg . Pt has h/o anxiety d/o but states that this chest pain is different . Had similar episode 2 evenings ago, it lasted for less than 1 hour.   Past Medical History  Diagnosis Date  . Allergy   . Depression   . Diabetes mellitus   . Osteoarthritis     Facet hypertrophy with injections  . Multiple thyroid nodules   . Gastric ulcer   . Migraine   . Grave's disease     Past Surgical History  Procedure Date  . Tubal ligation   . Axillary lymph node dissection 2001    Family History  Problem Relation Age of Onset  . Hypertension Mother   . Diabetes Mother   . Cancer Mother     rectal  . Heart disease Father 63    MI x5  . Hypertension Father   . Diabetes Father   . Cancer Sister 40    breast    History    Substance Use Topics  . Smoking status: Current Everyday Smoker -- 0.3 packs/day    Types: Cigarettes  . Smokeless tobacco: Not on file  . Alcohol Use: No    OB History    Grav Para Term Preterm Abortions TAB SAB Ect Mult Living                  Review of Systems  Constitutional: Negative for fever.  Respiratory: Positive for chest tightness. Negative for shortness of breath.   Cardiovascular: Positive for chest pain.  Gastrointestinal: Positive for nausea. Negative for vomiting.  Genitourinary: Negative for dysuria.  Musculoskeletal: Positive for back pain.  Neurological: Negative for weakness.  All other systems reviewed and are negative.    Allergies  Review of patient's allergies indicates no known allergies.  Home Medications   Current Outpatient Rx  Name Route Sig Dispense Refill  . ALBUTEROL SULFATE HFA 108 (90 BASE) MCG/ACT IN AERS Inhalation Inhale 2 puffs into the lungs every 4 (four) hours as needed for wheezing or shortness of breath. 1 Inhaler 5  . ALPRAZOLAM 0.5 MG PO TABS Oral Take 1 tablet (0.5 mg total) by mouth 3 (three) times daily as needed for anxiety. 15 tablet 0  . CETIRIZINE HCL 10 MG PO TABS Oral Take 1 tablet (10 mg total) by mouth daily. 30 tablet 2  .  CYCLOBENZAPRINE HCL 5 MG PO TABS Oral Take 1 tablet (5 mg total) by mouth at bedtime as needed for muscle spasms. 30 tablet 1  . ESOMEPRAZOLE MAGNESIUM 40 MG PO CPDR Oral Take 1 capsule (40 mg total) by mouth daily before breakfast. 90 capsule 3  . FLUTICASONE PROPIONATE 50 MCG/ACT NA SUSP Nasal Place 2 sprays into the nose daily. For congestion/allergies    . LEVOTHYROXINE SODIUM 150 MCG PO TABS Oral Take 1 tablet (150 mcg total) by mouth daily. 90 tablet 3  . LISINOPRIL 5 MG PO TABS Oral Take 0.5 tablets (2.5 mg total) by mouth daily. 30 tablet 5  . MEDROXYPROGESTERONE ACETATE 5 MG PO TABS Oral Take 5 mg by mouth daily.    Marland Kitchen METFORMIN HCL 500 MG PO TABS Oral Take 1 tablet (500 mg total) by mouth 2  (two) times daily with a meal. 180 tablet 3  . NAPROXEN 500 MG PO TABS Oral Take 1 tablet (500 mg total) by mouth 2 (two) times daily with a meal. 30 tablet 0  . OXYCODONE-ACETAMINOPHEN 5-325 MG PO TABS Oral Take 1 tablet by mouth daily as needed for pain. 15 tablet 0  . PROMETHAZINE HCL 25 MG PO TABS Oral Take 0.5 tablets (12.5 mg total) by mouth every 8 (eight) hours as needed for nausea. 30 tablet 1  . RIZATRIPTAN BENZOATE 10 MG PO TBDP Oral Take 1 tablet (10 mg total) by mouth as needed for migraine. May repeat in 2 hours if needed 10 tablet 0  . TOPIRAMATE 100 MG PO TABS Oral Take 1 tablet (100 mg total) by mouth daily. 30 tablet 2    BP 155/90  Pulse 67  Temp 97 F (36.1 C) (Oral)  Resp 20  SpO2 98%  LMP 12/31/2011  Physical Exam  Nursing note and vitals reviewed. Constitutional: She appears well-developed and well-nourished. She appears distressed.       Anxious and crying. Pt cannot sit up from supine position  HENT:  Head: Normocephalic and atraumatic.  Eyes: Conjunctivae and EOM are normal.  Neck: Normal range of motion. No tracheal deviation present.  Cardiovascular: Normal rate, regular rhythm and normal heart sounds.   Pulmonary/Chest: Effort normal and breath sounds normal. No respiratory distress. She has no wheezes. She has no rales. She exhibits tenderness.       Pt is exquisitely tender to light palpation of anterior chest wall  Abdominal: Soft. Bowel sounds are normal. She exhibits no distension and no mass. There is no tenderness. There is no rebound and no guarding.  Neurological: She is alert.       Oriented and answers appropriately and follows commands  Skin: Skin is warm and dry. She is not diaphoretic.    ED Course  Procedures (including critical care time)  Labs Reviewed  CBC - Abnormal; Notable for the following:    Hemoglobin 11.8 (*)     All other components within normal limits  COMPREHENSIVE METABOLIC PANEL - Abnormal; Notable for the following:     GFR calc non Af Amer 66 (*)     GFR calc Af Amer 77 (*)     All other components within normal limits  URINALYSIS, ROUTINE W REFLEX MICROSCOPIC - Abnormal; Notable for the following:    APPearance CLOUDY (*)     Leukocytes, UA TRACE (*)     All other components within normal limits  URINE MICROSCOPIC-ADD ON - Abnormal; Notable for the following:    Squamous Epithelial / LPF MANY (*)  Bacteria, UA MANY (*)     All other components within normal limits  LIPASE, BLOOD  POCT I-STAT TROPONIN I   Ct Angio Chest W/cm &/or Wo Cm  01/10/2012  *RADIOLOGY REPORT*  Clinical Data: Sudden sharp severe mid sternal chest pain. Shortness of breath.  CT ANGIOGRAPHY CHEST  Technique:  Multidetector CT imaging of the chest using the standard protocol during bolus administration of intravenous contrast. Multiplanar reconstructed images including MIPs were obtained and reviewed to evaluate the vascular anatomy.  Contrast:  100 ml Omnipaque 300  Comparison: None.  Findings: Technically adequate study with good opacification of the central and segmental pulmonary arteries.  No focal filling defects demonstrated.  No evidence of significant pulmonary embolus. Normal heart size.  Normal caliber thoracic aorta.  No evidence of dissection, although the aortic bolus is somewhat limited.  The esophagus is decompressed.  No significant lymphadenopathy in the chest.  Visualized portions of the upper abdominal organs are grossly unremarkable.  No pleural effusions.  Patchy ground-glass changes in the lungs may represent respiratory artifact versus edema.  No focal consolidation.  No pneumothorax.  Scattered small subpleural nodules likely representing lymph nodes.  Mild degenerative changes in the thoracic spine.  IMPRESSION: No evidence of significant pulmonary embolus.  Original Report Authenticated By: Marlon Pel, M.D.   Chest Portable 1 View  01/10/2012  *RADIOLOGY REPORT*  Clinical Data: Chest pain for 2 days,  smoking history, shortness of breath  PORTABLE CHEST - 1 VIEW  Comparison: The chest x-ray of 03/04/2011  Findings: The lungs are clear.  Mediastinal contours appear normal. The heart is within normal limits in size.  No bony abnormality is seen.  IMPRESSION: No active lung disease.  Original Report Authenticated By: Juline Patch, M.D.     Date: 01/10/2012  Rate: 65  Rhythm: normal sinus rhythm  QRS Axis: normal  Intervals: normal  ST/T Wave abnormalities: normal  Conduction Disutrbances:none  Narrative Interpretation:   Old EKG Reviewed: none available    1. Chest pain       MDM  46 y/o female smoker with PMH significant for Anxiety, DMTII, HTN c/o pressure like chst pain since awaking this AM. No prior cardiac history. O2 Sats and VSS.   EKG shows no ST/Twave changes, CXR normal, Troponin pending.   1545 Pt's pain is lessened (no Rx given)  Troponin negative  1600 Pt reports seeing spot which is her normal aura for migraine  1740 Pt has rec'd morphine 4mg  IV and CP is now 7/10   1849 Pt has rec'd 1mg  dilaudid with pain not at 6/10, CTA pending.   2055 Pt is nauseas is still awaiting CT will give phenergan  CT angio negative for PE. Will order second trop and d/c if negative.   2 troponin negative, no acute process likely, VSS. Discussed with attending Dr. Ranae Palms who agrees she is safe to d/c.  Pt verbalized understanding and agrees with care plan. Outpatient follow-up and return precautions given.      Wynetta Emery, PA-C 01/10/12 2358

## 2012-01-10 NOTE — ED Notes (Signed)
Pt states she does not take imitrex, she takes maxalt.

## 2012-01-10 NOTE — ED Notes (Signed)
Oxygen 2liters [place] aplied and monitor resting in 70's.zofran 4mg  given ivp for nausea post pain meds.pt appears comfortable.in no distress.will continue to monitor until EMS ARRIVE.REPORT GIVEN TO CHARGE NURSE [name]

## 2012-01-10 NOTE — ED Provider Notes (Signed)
History     CSN: [id number]  Arrival date & time 01/10/12  1223   First MD Initiated Contact with Patient 01/10/12 1240      Chief Complaint  Patient presents with  . Chest Pain    (Consider location/radiation/quality/duration/timing/severity/associated sxs/prior treatment) HPI Comments: 46 year old smoker female with history of diabetes and panic attacks. Here complaining of severe retrosternal chest pain since awakening this morning (more than 3 hours ago). Pain is described as stabbing in character, shooting through her chest. No radiation. Worse with movement. Denies diaphoresis or shortness of breath but reports pain with deep breathing. Denies abdominal pain, nausea, vomiting or diarrhea. Denies recent trauma.    Past Medical History  Diagnosis Date  . Allergy   . Depression   . Diabetes mellitus   . Osteoarthritis     Facet hypertrophy with injections  . Multiple thyroid nodules   . Gastric ulcer   . Migraine   . Grave's disease     Past Surgical History  Procedure Date  . Tubal ligation   . Axillary lymph node dissection 2001    Family History  Problem Relation Age of Onset  . Hypertension Mother   . Diabetes Mother   . Cancer Mother     rectal  . Heart disease Father 100    MI x5  . Hypertension Father   . Diabetes Father   . Cancer Sister 68    breast    History  Substance Use Topics  . Smoking status: Current Everyday Smoker -- 0.3 packs/day    Types: Cigarettes  . Smokeless tobacco: Not on file  . Alcohol Use: No    OB History    Grav Para Term Preterm Abortions TAB SAB Ect Mult Living                  Review of Systems  Constitutional: Negative for fever, chills and diaphoresis.  Respiratory: Negative for shortness of breath.   Cardiovascular: Positive for chest pain. Negative for leg swelling.  Gastrointestinal: Negative for nausea, vomiting and abdominal pain.  Skin: Negative for rash.  Neurological: Negative for dizziness, tremors,  syncope, weakness and headaches.    Allergies  Review of patient's allergies indicates no known allergies.  Home Medications   Current Outpatient Rx  Name Route Sig Dispense Refill  . ALBUTEROL SULFATE HFA 108 (90 BASE) MCG/ACT IN AERS Inhalation Inhale 2 puffs into the lungs every 4 (four) hours as needed for wheezing or shortness of breath. 1 Inhaler 5  . ALPRAZOLAM 0.5 MG PO TABS Oral Take 1 tablet (0.5 mg total) by mouth 3 (three) times daily as needed for anxiety. 15 tablet 0  . PRODIGY POCKET BLOOD GLUCOSE W/DEVICE KIT       . CETIRIZINE HCL 10 MG PO TABS Oral Take 1 tablet (10 mg total) by mouth daily. 30 tablet 2  . CYCLOBENZAPRINE HCL 5 MG PO TABS Oral Take 1 tablet (5 mg total) by mouth at bedtime as needed for muscle spasms. 30 tablet 1  . ESOMEPRAZOLE MAGNESIUM 40 MG PO CPDR Oral Take 1 capsule (40 mg total) by mouth daily before breakfast. 90 capsule 3  . FLUTICASONE PROPIONATE 50 MCG/ACT NA SUSP Nasal 2 sprays by Nasal route daily.      Marland Kitchen LEVOTHYROXINE SODIUM 150 MCG PO TABS Oral Take 1 tablet (150 mcg total) by mouth daily. 90 tablet 3  . LISINOPRIL 5 MG PO TABS Oral Take 0.5 tablets (2.5 mg total) by mouth daily. 30  tablet 5  . MEDROXYPROGESTERONE ACETATE 5 MG PO TABS Oral Take 1 tablet (5 mg total) by mouth daily. 10 tablet 0  . METFORMIN HCL 500 MG PO TABS Oral Take 1 tablet (500 mg total) by mouth 2 (two) times daily with a meal. 180 tablet 3  . NAPROXEN 500 MG PO TABS Oral Take 1 tablet (500 mg total) by mouth 2 (two) times daily with a meal. 30 tablet 0  . OXYCODONE-ACETAMINOPHEN 5-325 MG PO TABS Oral Take 1 tablet by mouth daily as needed for pain. 15 tablet 0  . PROMETHAZINE HCL 25 MG PO TABS Oral Take 0.5 tablets (12.5 mg total) by mouth every 8 (eight) hours as needed for nausea. 30 tablet 1  . RIZATRIPTAN BENZOATE 10 MG PO TBDP Oral Take 1 tablet (10 mg total) by mouth as needed for migraine. May repeat in 2 hours if needed 10 tablet 0  . TOPIRAMATE 100 MG PO TABS  Oral Take 1 tablet (100 mg total) by mouth daily. 30 tablet 2    There were no vitals taken for this visit.  Physical Exam  Nursing note and vitals reviewed. Constitutional: She is oriented to person, place, and time. She appears well-developed and well-nourished. She appears distressed.  HENT:  Head: Normocephalic and atraumatic.  Mouth/Throat: Oropharynx is clear and moist.  Neck: Normal range of motion. Neck supple. No JVD present.  Cardiovascular: Normal rate, regular rhythm and normal heart sounds.   Pulmonary/Chest: Effort normal and breath sounds normal. No respiratory distress. She has no wheezes. She has no rales. She exhibits no tenderness.  Abdominal: Soft. She exhibits no distension and no mass. There is no tenderness. There is no rebound and no guarding.  Neurological: She is alert and oriented to person, place, and time.  Skin: No rash noted. She is not diaphoretic.    ED Course  Procedures (including critical care time)  Labs Reviewed  GLUCOSE, CAPILLARY - Abnormal; Notable for the following:    Glucose-Capillary 129 (*)     All other components within normal limits   No results found.   1. Chest pain       MDM  46 year old smoker female with history of diabetes and panic attacks. Here complaining of severe retrosternal chest pain since awakening this morning (more than 3 hours ago). Vital signs stable. EKG normal sinus rhythm with a ventricular rate in the 70s no ST changes or other acute ischemic changes. Patient reported severe pain and appeared uncomfortable. IV was placed and patient received 2 mg of morphine IV prior to transfer to the emergency department via EMS for further evaluation and management.        Sharin Grave, MD 01/11/12 1537

## 2012-01-10 NOTE — ED Notes (Signed)
Pt states she began having R chest pain today and drove herself to Urgent Care. Pt was given 4 Morphine and (4) 81mg  chewable ASA at [facility]. Pain unchanged after morphine and ASA. Pt denies L chest pain or radiation. Pt is slightly nauseous from coughing - states she had heard somewhere that coughing can help with chest pain. EKG unremarkable per EMS and [facility].

## 2012-01-10 NOTE — ED Notes (Signed)
cbg 97 

## 2012-01-10 NOTE — ED Notes (Signed)
Pt brought back urgent from waiting area with c/o sudden sharp severe mid sternal cp shooting through back that started this am.denies radiation.pain 10/10 constant pain.sob and appears in distress.vss.ekg obtained.

## 2012-01-11 MED ORDER — OXYCODONE-ACETAMINOPHEN 5-325 MG PO TABS: 2.0000 | ORAL_TABLET | ORAL | Status: DC | PRN

## 2012-01-11 NOTE — ED Provider Notes (Signed)
Medical screening examination/treatment/procedure(s) were conducted as a shared visit with non-physician practitioner(s) and myself.  I personally evaluated the patient during the encounter   Loren Racer, MD 01/11/12 1504

## 2012-01-31 ENCOUNTER — Other Ambulatory Visit: Payer: Self-pay | Admitting: Family Medicine

## 2012-02-07 ENCOUNTER — Ambulatory Visit (INDEPENDENT_AMBULATORY_CARE_PROVIDER_SITE_OTHER): Payer: Medicare Other | Admitting: Family Medicine

## 2012-02-07 ENCOUNTER — Encounter: Payer: Self-pay | Admitting: Family Medicine

## 2012-02-07 VITALS — BP 144/87 | HR 93 | Ht 63.0 in | Wt 197.0 lb

## 2012-02-07 DIAGNOSIS — R0789 Other chest pain: Secondary | ICD-10-CM

## 2012-02-07 DIAGNOSIS — F3289 Other specified depressive episodes: Secondary | ICD-10-CM

## 2012-02-07 DIAGNOSIS — F411 Generalized anxiety disorder: Secondary | ICD-10-CM

## 2012-02-07 DIAGNOSIS — E890 Postprocedural hypothyroidism: Secondary | ICD-10-CM

## 2012-02-07 DIAGNOSIS — F17200 Nicotine dependence, unspecified, uncomplicated: Secondary | ICD-10-CM | POA: Diagnosis not present

## 2012-02-07 DIAGNOSIS — F329 Major depressive disorder, single episode, unspecified: Secondary | ICD-10-CM | POA: Diagnosis not present

## 2012-02-07 DIAGNOSIS — E119 Type 2 diabetes mellitus without complications: Secondary | ICD-10-CM

## 2012-02-07 DIAGNOSIS — F419 Anxiety disorder, unspecified: Secondary | ICD-10-CM

## 2012-02-07 LAB — POCT GLYCOSYLATED HEMOGLOBIN (HGB A1C): Hemoglobin A1C: 6.8

## 2012-02-07 MED ORDER — ALPRAZOLAM 0.5 MG PO TABS: 0.5000 mg | ORAL_TABLET | Freq: Three times a day (TID) | ORAL | Status: DC | PRN

## 2012-02-07 NOTE — Assessment & Plan Note (Signed)
Not ready for cessation.

## 2012-02-07 NOTE — Assessment & Plan Note (Signed)
Most likely noncardiac either MSK or anxiety attacks. CTA and enzymes, EKG negative 8/13. Will continue flexeril prn. Reassurance to patient. Will refer to psychotherapy for better control of anxiety symptoms in setting of likely bipolar depression. She does have cardiac risk factors including DM, smoking. May be a good candidate for treadmill stress EKG.

## 2012-02-07 NOTE — Progress Notes (Signed)
  Subjective:    Patient ID: Kendra Hall, female    DOB: 1965-10-02, 46 y.o.   MRN: [id number]  HPI  1. F/u chest pain. Patient seen in ED and discharged, negative CTA and enzymes. Thought to be related to anxiety or MSK. She endorses having panic attacks previously, this felt slightly different with right sternal cramping but she did have arm tingling, dyspnea, sweating along with it. It has eased off since ED visit. Flexeril helped, xanax helped. Active smoker. She has never had a stress test.   2. Anxiety/bipolar. Endorses some recent episodes of racing thoughts, emotional lability with crying. Recalls a period lasting 1-2 days of her mind going "1000 mph". She endorsed decreased need for sleep, but states she woke up and just laid in bed and watched TV. States she was diagnosed as bipolar several years ago, but stopped taking the medications and doesn't think she needs them. She has undergone times of depression but tries to endure on her own. Gives history of manic type symptoms, but lasting only few hours or 1-2 days. States she was previously on depakote, lithium. She is interested in seeing a therapist, but not in medications. Xanax does seem to help the panic attacks and anxiety.   3. Hypothyroidism. Graves disease s/p radioiodine. Some fluctuating TFTs and symptoms. Thyroxine dose increased about one month ago. Anxiety is increased, she has now lost few pounds after gaining ~10 lbs.   4. DM. Does not take metformin as prescribed. She is trying diet and exercise for control.   Review of Systems See HPI otherwise negative.  reports that she has been smoking Cigarettes.  She has been smoking about .3 packs per day. She does not have any smokeless tobacco history on file.     Objective:   Physical Exam  Vitals reviewed. Constitutional: She is oriented to person, place, and time. She appears well-developed and well-nourished. No distress.  HENT:  Head: Normocephalic and atraumatic.    Eyes: EOM are normal.  Neck: Neck supple.  Cardiovascular: Normal rate, regular rhythm and normal heart sounds.   No murmur heard. Pulmonary/Chest: Effort normal and breath sounds normal. No respiratory distress. She has no wheezes. She has no rales.  Musculoskeletal: She exhibits no edema and no tenderness.  Neurological: She is alert and oriented to person, place, and time. Coordination normal.  Skin: No rash noted. She is not diaphoretic.  Psychiatric: She has a normal mood and affect.       Speech and thoughts normal. Smiles and laughs, affect is improved from previous.          Assessment & Plan:

## 2012-02-07 NOTE — Assessment & Plan Note (Signed)
Compliant with newly increased thyroxine. Will f/u TSH at next visit.

## 2012-02-07 NOTE — Assessment & Plan Note (Signed)
Worsened recently. Ideally could start an SSRI or SNRI, but may trigger bipolar disease if no mood stabilizer. Will continue xanax prn for now. Refer to [employer]. Check TSH in 2 weeks after recent titration as this may be contributing also.

## 2012-02-07 NOTE — Patient Instructions (Addendum)
Nice to see you.  I think you are having stress/mania type symptoms. Please consider seeing a psychiatrist, either call Dr. Pascal Lux or go to [place]. [address], [place], [place] [address] Please make appointment in 2-4 weeks for thyroid check. Will get a stress test, please call office if you have not heard from [place] in 2 weeks.

## 2012-02-07 NOTE — Assessment & Plan Note (Signed)
History sounds like a bipolar variant, but with thyroid titration possibly playing a part in the anxiety/manic component currently. Not actively manic or depressed currently. Patient is refusing mood stablizer. She is agreeable to seek therapy and provided with information to [facility] and Dr. Pascal Lux for therapy. Previously considered an SNRI for her chronic pain symptoms and depression; however a diagnosis of bipolar may preclude the use of these agents for risk of precipitating mania. F/u in 2 weeks.

## 2012-02-25 ENCOUNTER — Telehealth: Payer: Self-pay | Admitting: Family Medicine

## 2012-02-25 NOTE — Telephone Encounter (Signed)
Call patient when ready to pick up form.  Need for school to explain patient needing to work from home when episodes of migraines.  Need to have ?by Friday.

## 2012-02-26 NOTE — Telephone Encounter (Signed)
Documentation of Physical Impairment Verification Form for [facility] completed by Dr. Cristal Ford.  Kendra Hall notified form is ready to be picked up at front desk.  Kendra Hall

## 2012-02-28 ENCOUNTER — Ambulatory Visit (INDEPENDENT_AMBULATORY_CARE_PROVIDER_SITE_OTHER): Payer: Medicare Other | Admitting: Family Medicine

## 2012-02-28 ENCOUNTER — Encounter: Payer: Self-pay | Admitting: Family Medicine

## 2012-02-28 VITALS — BP 141/82 | HR 80 | Temp 98.6°F | Wt 200.0 lb

## 2012-02-28 DIAGNOSIS — M545 Low back pain, unspecified: Secondary | ICD-10-CM

## 2012-02-28 DIAGNOSIS — J4520 Mild intermittent asthma, uncomplicated: Secondary | ICD-10-CM

## 2012-02-28 DIAGNOSIS — M5380 Other specified dorsopathies, site unspecified: Secondary | ICD-10-CM

## 2012-02-28 DIAGNOSIS — F419 Anxiety disorder, unspecified: Secondary | ICD-10-CM

## 2012-02-28 DIAGNOSIS — M62830 Muscle spasm of back: Secondary | ICD-10-CM

## 2012-02-28 DIAGNOSIS — F411 Generalized anxiety disorder: Secondary | ICD-10-CM

## 2012-02-28 DIAGNOSIS — G43709 Chronic migraine without aura, not intractable, without status migrainosus: Secondary | ICD-10-CM | POA: Diagnosis not present

## 2012-02-28 DIAGNOSIS — E890 Postprocedural hypothyroidism: Secondary | ICD-10-CM | POA: Diagnosis not present

## 2012-02-28 DIAGNOSIS — J45909 Unspecified asthma, uncomplicated: Secondary | ICD-10-CM

## 2012-02-28 MED ORDER — NAPROXEN 500 MG PO TABS: 500.0000 mg | ORAL_TABLET | Freq: Every day | ORAL | Status: DC | PRN

## 2012-02-28 NOTE — Patient Instructions (Addendum)
Seems like you are having headaches, stress related to starting a new routine. Keep taking topamax 100 mg daily. You can take naproxen at onset of headache. Do not take more than twice weekly.  Make sure to get plenty of sleep and eat well. You can take tylenol 650 mg up to 4 times daily. Make sure to exercise one hour daily.  Stretch your muscles daily. You can use heating pad at night.

## 2012-02-29 LAB — T4, FREE: Free T4: 1.47 ng/dL (ref 0.80–1.80)

## 2012-02-29 LAB — TSH: TSH: 0.919 u[IU]/mL (ref 0.350–4.500)

## 2012-03-01 NOTE — Assessment & Plan Note (Addendum)
Recommend naproxen/tylenol and regular exercise/stretching. May consider addition of lyrica/neurontin if uncontrolled in the future, in the setting of frequent varied pain complaints. F/u in one month.

## 2012-03-01 NOTE — Assessment & Plan Note (Signed)
Stable currently. Will continue to recommend specialty care, at least psychotherapy since she refuses any mood stabilizers and will not be prescribing chronic benzodiazepine.

## 2012-03-01 NOTE — Assessment & Plan Note (Signed)
TSH stable. Will continue current therapy and repeat in 4-6 months.

## 2012-03-01 NOTE — Assessment & Plan Note (Signed)
Stable. Has albuterol prn. No recent exacerbations.

## 2012-03-01 NOTE — Assessment & Plan Note (Signed)
Acute migraine today, has not tried any abortive therapy. Recommended she try naproxen for infrequent breakthrough headache. Continue topamax. Suspect is exacerbated by recent changes with starting college courses. Advised she get plenty of rest, eat well, avoid excess caffeine. Follow up in one month.   Completed school form listing her medical problems, and potential need for monthly MD visits and infrequent need to avoid light/sound with migraine exacerbation.

## 2012-03-01 NOTE — Progress Notes (Signed)
  Subjective:    Patient ID: Kendra Hall, female    DOB: Mar 13, 1966, 46 y.o.   MRN: [id number]  HPI  1. Migraine. Having an acute attack today. Her headache is right sided, throbbing, with light sensitivity similar to previous migraines. Denies any changes in sleeping pattern, eating changes or new medications. Had one headache last week, and prior to this none in recent memory. She is compliant with topamax.  Recently started classes for IT training at [employer] and brings in a form for health history. I completed this last week in regards to migraine, but she requests all history be added for completion. States that the school administrator said she was a "walking liability."  2. Hypothyroid. Compliant with increased dose. Denies any recent panic attacks, palpitations, weight shifts. Due for TSH.  3. Muscle cramps/back pain. States her neck/shoulder and back pains have restarted this week. Flexeril and naproxen are mildly helpful, but she doesn't take frequently.   4. Intermittent asthma. Patient had to use her emergency inhaler once last week. No cough, dyspnea, or wheezing currently.  5. Anxiety. Has not established with [facility] for specialty evaluation as i suggested. She is resistant to antidepressant medications, has been told she is bipolar by a mental health provider in the past. No recent panic attacks. Seems to be functioning in class so far.   Review of Systems See HPI otherwise negative.  reports that she has been smoking Cigarettes.  She has been smoking about .3 packs per day. She does not have any smokeless tobacco history on file.     Objective:   Physical Exam  Vitals reviewed. Constitutional: She is oriented to person, place, and time. She appears well-developed and well-nourished.       Lights off in exam room.  HENT:  Mouth/Throat: Oropharynx is clear and moist.  Eyes: EOM are normal. Pupils are equal, round, and reactive to light.  Neck: Normal range of motion. Neck  supple. No thyromegaly present.       Muscular spasm in bilateral trapezius and shoulders. No trigger points identified.  Cardiovascular: Normal rate, regular rhythm and normal heart sounds.   No murmur heard. Pulmonary/Chest: Effort normal and breath sounds normal. No respiratory distress. She has no wheezes. She has no rales.  Musculoskeletal: She exhibits tenderness. She exhibits no edema.       Tender in R>L trapezius area.   Neurological: She is alert and oriented to person, place, and time. No cranial nerve deficit.  Skin: No rash noted.  Psychiatric: She has a normal mood and affect. Her behavior is normal.       Speech and dress are normal.       Assessment & Plan:

## 2012-05-27 ENCOUNTER — Other Ambulatory Visit: Payer: Self-pay | Admitting: Family Medicine

## 2012-05-29 ENCOUNTER — Other Ambulatory Visit: Payer: Self-pay | Admitting: Family Medicine

## 2012-05-29 NOTE — Telephone Encounter (Signed)
Patient needs refill of Maxol and Albuterol.  She used [facility] [address]  She also wants to know if the Maxol can be increased because she has been having a lot of migraines.

## 2012-06-02 MED ORDER — RIZATRIPTAN BENZOATE 10 MG PO TBDP: 10.0000 mg | ORAL_TABLET | ORAL | Status: DC | PRN

## 2012-06-02 MED ORDER — ALBUTEROL SULFATE HFA 108 (90 BASE) MCG/ACT IN AERS: 2.0000 | INHALATION_SPRAY | RESPIRATORY_TRACT | Status: DC | PRN

## 2012-06-26 ENCOUNTER — Encounter (HOSPITAL_COMMUNITY): Payer: Self-pay | Admitting: *Deleted

## 2012-06-26 ENCOUNTER — Emergency Department (HOSPITAL_COMMUNITY)
Admission: EM | Admit: 2012-06-26 | Discharge: 2012-06-27 | Disposition: A | Discharge status: Home / Self Care | Payer: Medicare Other | Attending: Emergency Medicine | Admitting: Emergency Medicine

## 2012-06-26 DIAGNOSIS — E119 Type 2 diabetes mellitus without complications: Secondary | ICD-10-CM | POA: Insufficient documentation

## 2012-06-26 DIAGNOSIS — Z8719 Personal history of other diseases of the digestive system: Secondary | ICD-10-CM | POA: Insufficient documentation

## 2012-06-26 DIAGNOSIS — R110 Nausea: Secondary | ICD-10-CM | POA: Diagnosis not present

## 2012-06-26 DIAGNOSIS — H538 Other visual disturbances: Secondary | ICD-10-CM | POA: Diagnosis not present

## 2012-06-26 DIAGNOSIS — Z79899 Other long term (current) drug therapy: Secondary | ICD-10-CM | POA: Diagnosis not present

## 2012-06-26 DIAGNOSIS — F3289 Other specified depressive episodes: Secondary | ICD-10-CM | POA: Insufficient documentation

## 2012-06-26 DIAGNOSIS — Z8639 Personal history of other endocrine, nutritional and metabolic disease: Secondary | ICD-10-CM | POA: Insufficient documentation

## 2012-06-26 DIAGNOSIS — R42 Dizziness and giddiness: Secondary | ICD-10-CM | POA: Diagnosis not present

## 2012-06-26 DIAGNOSIS — F329 Major depressive disorder, single episode, unspecified: Secondary | ICD-10-CM | POA: Insufficient documentation

## 2012-06-26 DIAGNOSIS — M1990 Unspecified osteoarthritis, unspecified site: Secondary | ICD-10-CM | POA: Insufficient documentation

## 2012-06-26 DIAGNOSIS — M62838 Other muscle spasm: Secondary | ICD-10-CM

## 2012-06-26 DIAGNOSIS — Z862 Personal history of diseases of the blood and blood-forming organs and certain disorders involving the immune mechanism: Secondary | ICD-10-CM | POA: Diagnosis not present

## 2012-06-26 DIAGNOSIS — H81399 Other peripheral vertigo, unspecified ear: Secondary | ICD-10-CM

## 2012-06-26 DIAGNOSIS — F17200 Nicotine dependence, unspecified, uncomplicated: Secondary | ICD-10-CM | POA: Insufficient documentation

## 2012-06-26 DIAGNOSIS — Z8679 Personal history of other diseases of the circulatory system: Secondary | ICD-10-CM | POA: Insufficient documentation

## 2012-06-26 LAB — GLUCOSE, CAPILLARY: Glucose-Capillary: 111 mg/dL — ABNORMAL HIGH (ref 70–99)

## 2012-06-26 MED ORDER — MECLIZINE HCL 25 MG PO TABS
25.0000 mg | ORAL_TABLET | Freq: Once | ORAL | Status: AC
  Administered 2012-06-26: 25 mg via ORAL
  Filled 2012-06-26: qty 1

## 2012-06-26 MED ORDER — MECLIZINE HCL 25 MG PO TABS: 25.0000 mg | ORAL_TABLET | Freq: Three times a day (TID) | ORAL | Status: DC | PRN

## 2012-06-26 MED ORDER — CYCLOBENZAPRINE HCL 10 MG PO TABS: 10.0000 mg | ORAL_TABLET | Freq: Three times a day (TID) | ORAL | Status: DC | PRN

## 2012-06-26 NOTE — ED Notes (Addendum)
Pt A.O. X 4. Reports hx of migraines. Over the past month medications have been effective. States "migranies and headaches have become for frequent and have lasted longer." Reports light sensitivity and nausea with headaches. Denies N/V and headache currently. Denies SOB. Reports soreness in neck. Left side. Area of tension noted on palpation. NAD. No Further needs at this at this time.

## 2012-06-26 NOTE — ED Notes (Signed)
Pt c/o dizziness since Sunday, and blurred vision for approx 1 month.  Also c/o left sided neck pain.

## 2012-06-26 NOTE — ED Notes (Signed)
Daughter at bedside.

## 2012-06-26 NOTE — ED Provider Notes (Signed)
History     CSN: [id number]  Arrival date & time 06/26/12  2000   First MD Initiated Contact with Patient 06/26/12 2205      Chief Complaint  Patient presents with  . Dizziness  . Blurred Vision   HPI  History provided by the patient. Patient is a 47 year old female with history of diabetes, osteoarthritis, hypothyroidism and migraines who presents with complaints of episodes of dizziness. Patient reports having episodes of feeling dizzy as if the room or her body was spinning and she was off balance for the past week. Symptoms initially occurred one to 3 times a day lasting a few seconds. Symptoms continued to be brief lasting only seconds but have been increasing in frequency over the past few days. Symptoms have been associated with some nausea on occasion without episodes of vomiting. Patient denies any recent URI symptoms, ear pain or ringing in ears. Patient does report having some occasional episodes of brief blurred vision for the past one month. This has not seemed to coincide with dizziness symptoms. Vision problems are not persistent. Patient was last seen by ophthalmology specialist approximately one year ago. Patient does use glasses for reading.    Past Medical History  Diagnosis Date  . Allergy   . Depression   . Diabetes mellitus   . Osteoarthritis     Facet hypertrophy with injections  . Multiple thyroid nodules   . Gastric ulcer   . Migraine   . Grave's disease     Past Surgical History  Procedure Date  . Tubal ligation   . Axillary lymph node dissection 2001    Family History  Problem Relation Age of Onset  . Hypertension Mother   . Diabetes Mother   . Cancer Mother     rectal  . Heart disease Father 54    MI x5  . Hypertension Father   . Diabetes Father   . Cancer Sister 70    breast    History  Substance Use Topics  . Smoking status: Current Every Day Smoker -- 0.3 packs/day    Types: Cigarettes  . Smokeless tobacco: Not on file  . Alcohol  Use: No    OB History    Grav Para Term Preterm Abortions TAB SAB Ect Mult Living                  Review of Systems  Constitutional: Negative for fever and chills.  HENT: Negative for ear pain, congestion, rhinorrhea and tinnitus.   Respiratory: Negative for shortness of breath.   Cardiovascular: Negative for chest pain, palpitations and leg swelling.  Neurological: Positive for dizziness. Negative for tremors, facial asymmetry, speech difficulty, weakness, light-headedness, numbness and headaches.  All other systems reviewed and are negative.    Allergies  Review of patient's allergies indicates no known allergies.  Home Medications   Current Outpatient Rx  Name  Route  Sig  Dispense  Refill  . ALBUTEROL SULFATE HFA 108 (90 BASE) MCG/ACT IN AERS   Inhalation   Inhale 2 puffs into the lungs every 4 (four) hours as needed for wheezing.   1 Inhaler   5   . ALPRAZOLAM 0.5 MG PO TABS   Oral   Take 1 tablet (0.5 mg total) by mouth 3 (three) times daily as needed for anxiety.   15 tablet   0   . BENZONATATE 100 MG PO CAPS      TAKE 1 CAPSULE BY MOUTH 2 TIMES DAILY AS NEEDED FOR  COUGH   15 capsule   0   . CETIRIZINE HCL 10 MG PO TABS   Oral   Take 1 tablet (10 mg total) by mouth daily.   30 tablet   2   . ESOMEPRAZOLE MAGNESIUM 40 MG PO CPDR   Oral   Take 1 capsule (40 mg total) by mouth daily before breakfast.   90 capsule   3   . LEVOTHYROXINE SODIUM 150 MCG PO TABS   Oral   Take 1 tablet (150 mcg total) by mouth daily.   90 tablet   3   . NAPROXEN 500 MG PO TABS   Oral   Take 1 tablet (500 mg total) by mouth daily as needed.   30 tablet   0     Do not take more than twice weekly.   . OXYCODONE-ACETAMINOPHEN 5-325 MG PO TABS   Oral   Take 1 tablet by mouth daily as needed for pain.   15 tablet   0   . PROMETHAZINE HCL 25 MG PO TABS   Oral   Take 0.5 tablets (12.5 mg total) by mouth every 8 (eight) hours as needed for nausea.   30 tablet    1   . RIZATRIPTAN BENZOATE 10 MG PO TBDP   Oral   Take 1 tablet (10 mg total) by mouth as needed for migraine. May repeat in 2 hours if needed   15 tablet   0     Needs office visit prior to refills   . TOPIRAMATE 100 MG PO TABS   Oral   Take 1 tablet (100 mg total) by mouth daily.   30 tablet   2   . OXYCODONE-ACETAMINOPHEN 5-325 MG PO TABS   Oral   Take 2 tablets by mouth every 4 (four) hours as needed for pain.   18 tablet   0     BP 127/81  Pulse 80  Temp 98.2 F (36.8 C) (Oral)  Resp 16  SpO2 98%  Physical Exam  Nursing note and vitals reviewed. Constitutional: She is oriented to person, place, and time. She appears well-developed and well-nourished. No distress.  HENT:  Head: Normocephalic and atraumatic.  Right Ear: Tympanic membrane normal.  Left Ear: Tympanic membrane normal.  Eyes: Conjunctivae normal and EOM are normal. Pupils are equal, round, and reactive to light.  Neck: Normal range of motion. Neck supple.       No meningeal signs  Cardiovascular: Normal rate and regular rhythm.   No murmur heard. Pulmonary/Chest: Effort normal and breath sounds normal. No respiratory distress. She has no wheezes. She has no rales.  Abdominal: Soft. There is no tenderness. There is no rigidity, no rebound, no guarding, no CVA tenderness and no tenderness at McBurney's point.  Musculoskeletal:       There is tightness and tenderness to the left trapezius at the base of the neck area. Normal full range of motion of neck. No nodule or swelling of the soft tissues. Skin appears normal.  Neurological: She is alert and oriented to person, place, and time. She has normal strength. No cranial nerve deficit or sensory deficit. Gait normal.  Skin: Skin is warm and dry. No rash noted.  Psychiatric: She has a normal mood and affect. Her behavior is normal.    ED Course  Procedures   Results for orders placed during the hospital encounter of 06/26/12  GLUCOSE, CAPILLARY       Component Value Range   Glucose-Capillary 111 (*)  70 - 99 mg/dL        1. Peripheral vertigo   2. Trapezius muscle spasm       MDM  10:50PM patient seen and evaluated. Patient currently resting in bed and appears comfortable.  Patient with normal nonfocal neuro exam. Symptoms are episodic and brief lasting only a few seconds. They are infrequent occurring only a few times a day. Patient has planned appointment with PCP on Monday. At this time symptoms are consistent with peripheral vertigo. Will treat symptomatically and have patient followup as planned for continued evaluation. Patient agrees with this plan.        Angus Seller, [place] 06/27/12 [phone]

## 2012-06-27 NOTE — ED Provider Notes (Signed)
Medical screening examination/treatment/procedure(s) were performed by non-physician practitioner and as supervising physician I was immediately available for consultation/collaboration.   Dione Booze, MD 06/27/12 [phone]

## 2012-06-29 ENCOUNTER — Ambulatory Visit (INDEPENDENT_AMBULATORY_CARE_PROVIDER_SITE_OTHER): Payer: Medicare Other | Admitting: Family Medicine

## 2012-06-29 ENCOUNTER — Encounter: Payer: Self-pay | Admitting: Family Medicine

## 2012-06-29 VITALS — BP 123/73 | HR 90 | Temp 98.1°F | Wt 202.0 lb

## 2012-06-29 DIAGNOSIS — R42 Dizziness and giddiness: Secondary | ICD-10-CM

## 2012-06-29 NOTE — Assessment & Plan Note (Addendum)
Vertigo could be secondary to migrainous vertigo vs. Meniere's disease (+ tinnitus) vs. BPH).  Orthostatic VSS normal in office today. - Strongly encouraged patient to wear eyeglasses as directed per optometrist - noncompliance could be causing symptoms - Will treat with Benadryl and/or Xanax (she has this at home) at bedtime PRN to prevent vertigo spells - Return to clinic in 2 weeks with PCP or sooner if symptoms worsen - May consider ENT referral for therapy

## 2012-06-29 NOTE — Progress Notes (Signed)
  Subjective:    Patient ID: Kendra Hall, female    DOB: [birth year], 47 y.o.   MRN: [id number]  HPI  Patient here for same day appointment.  Symptoms: dizzy spells - "room spinning" but only for seconds, resolves on its own.  Started 8 days ago after she had a migraine HA.  Dizzy spells have occurred every night since then.  Spells occur when only laying down, not associated with changing positions.  Went to ED recently, dx with Benign Positional Vertigo.  Was given Antivert which made her feel worse.  Migraine HA have been getting worse, lasting longer.  Has both Phenergan and Xanax at home, but only takes as needed, not everyday.  + Ringing in ears - high pitched tone in both ears, worse in RT ear.  + Blurry vision, patient saw an optometrist in July, given Rx glasses but she does not always wear them.  Denies any associated HA, nausea/vomiting, unilateral weakness, or change in speech.  Review of Systems  Per HPI    Objective:   Physical Exam  Constitutional: She appears well-nourished. No distress.  HENT:  Head: Normocephalic and atraumatic.  Neck: Neck supple.       Limited ROM neck due to chronic shoulder pain  Cardiovascular: Normal rate, regular rhythm and normal heart sounds.   Pulmonary/Chest: Effort normal. She has no wheezes. She has no rales.  Neurological: She is alert. She has normal strength. No cranial nerve deficit or sensory deficit. Coordination abnormal. Gait normal.       Had difficulty with balance with Romberg test.      Assessment & Plan:

## 2012-06-29 NOTE — Patient Instructions (Addendum)
It was nice to meet you today, Kendra Hall. Your symptoms could be related to you not wearing your eyeglasses everyday or migraine or vertigo. Try taking either Benadryl or Xanax one tablet at bedtime to see if this improves symptoms. Schedule follow up appointment with your PCP in 2 weeks or sooner if symptoms worsen.  Vertigo Vertigo means you feel like you or your surroundings are moving when they are not. Vertigo can be dangerous if it occurs when you are at work, driving, or performing difficult activities.  CAUSES  Vertigo occurs when there is a conflict of signals sent to your brain from the visual and sensory systems in your body. There are many different causes of vertigo, including:  Infections, especially in the inner ear.  A bad reaction to a drug or misuse of alcohol and medicines.  Withdrawal from drugs or alcohol.  Rapidly changing positions, such as lying down or rolling over in bed.  A migraine headache.  Decreased blood flow to the brain.  Increased pressure in the brain from a head injury, infection, tumor, or bleeding. SYMPTOMS  You may feel as though the world is spinning around or you are falling to the ground. Because your balance is upset, vertigo can cause nausea and vomiting. You may have involuntary eye movements (nystagmus). DIAGNOSIS  Vertigo is usually diagnosed by physical exam. If the cause of your vertigo is unknown, your caregiver may perform imaging tests, such as an MRI scan (magnetic resonance imaging). TREATMENT  Most cases of vertigo resolve on their own, without treatment. Depending on the cause, your caregiver may prescribe certain medicines. If your vertigo is related to body position issues, your caregiver may recommend movements or procedures to correct the problem. In rare cases, if your vertigo is caused by certain inner ear problems, you may need surgery. HOME CARE INSTRUCTIONS   Follow your caregiver's instructions.  Avoid driving.  Avoid  operating heavy machinery.  Avoid performing any tasks that would be dangerous to you or others during a vertigo episode.  Tell your caregiver if you notice that certain medicines seem to be causing your vertigo. Some of the medicines used to treat vertigo episodes can actually make them worse in some people. SEEK IMMEDIATE MEDICAL CARE IF:   Your medicines do not relieve your vertigo or are making it worse.  You develop problems with talking, walking, weakness, or using your arms, hands, or legs.  You develop severe headaches.  Your nausea or vomiting continues or gets worse.  You develop visual changes.  A family member notices behavioral changes.  Your condition gets worse. MAKE SURE YOU:  Understand these instructions.  Will watch your condition.  Will get help right away if you are not doing well or get worse. Document Released: 03/20/2005 Document Revised: 09/02/2011 Document Reviewed: 12/27/2010 [facility] Patient Information 2013 [place], [place].

## 2012-07-13 ENCOUNTER — Ambulatory Visit (INDEPENDENT_AMBULATORY_CARE_PROVIDER_SITE_OTHER): Payer: Medicare Other | Admitting: Family Medicine

## 2012-07-13 ENCOUNTER — Encounter: Payer: Self-pay | Admitting: Family Medicine

## 2012-07-13 VITALS — BP 136/79 | HR 73 | Temp 98.1°F | Wt 206.0 lb

## 2012-07-13 DIAGNOSIS — E119 Type 2 diabetes mellitus without complications: Secondary | ICD-10-CM | POA: Diagnosis not present

## 2012-07-13 DIAGNOSIS — H538 Other visual disturbances: Secondary | ICD-10-CM

## 2012-07-13 DIAGNOSIS — H1045 Other chronic allergic conjunctivitis: Secondary | ICD-10-CM | POA: Diagnosis not present

## 2012-07-13 DIAGNOSIS — R42 Dizziness and giddiness: Secondary | ICD-10-CM | POA: Diagnosis not present

## 2012-07-13 DIAGNOSIS — H1010 Acute atopic conjunctivitis, unspecified eye: Secondary | ICD-10-CM | POA: Insufficient documentation

## 2012-07-13 LAB — POCT GLYCOSYLATED HEMOGLOBIN (HGB A1C): Hemoglobin A1C: 7.6

## 2012-07-13 MED ORDER — OLOPATADINE HCL 0.2 % OP SOLN: 1.0000 [drp] | Freq: Every day | OPHTHALMIC | Status: DC

## 2012-07-13 MED ORDER — CETIRIZINE HCL 10 MG PO TABS: 10.0000 mg | ORAL_TABLET | Freq: Every day | ORAL | Status: DC

## 2012-07-13 MED ORDER — RIZATRIPTAN BENZOATE 10 MG PO TBDP: 10.0000 mg | ORAL_TABLET | ORAL | Status: DC | PRN

## 2012-07-13 NOTE — Assessment & Plan Note (Signed)
New onset, no sign of bacterial infection. Try conservative therapy for presumed allergic conjunctivitis pataday and refilled antihistamine. F/u prn.

## 2012-07-13 NOTE — Assessment & Plan Note (Signed)
Worsened control. Advised patient to try metformin or trial of sulfonylurea. She is resistant and wants to try diet/exercise. Refuses nutrition counseling. F/u in 3 months.

## 2012-07-13 NOTE — Progress Notes (Signed)
  Subjective:    Patient ID: Kendra Hall, female    DOB: [birth year], 47 y.o.   MRN: [id number]  HPI  1. F/u vertigo. States symptoms resolved x 1-2 days. Not using meclizine.   2. Right eye discharge. Started yesterday, crusting after awakening and tearing today. Slight redness and puffiness. She has had allergies do this previously. Needs refill on anti-histamine. Denies trauma to eye, active headache, acute vision changes, eye pain.  3. Blurred vision. This is a subacute problem and bilateral complaint. Tried to refer to ophthalmology 6 months ago, but did not happen for unknown reason and patient is only now enquiring. She has already seen optometrist in past one year, and needs new referral. Denies eye pain, weakness, numbness, balance issues.   4. DM2. A1c elevated from 6.8 to 7.5 today due to dietary nonadherence. She has gained 11 lbs in past year. Refused nutrition counseling. Refuses to try metformin or explore other possibilities, because she knows she can reverse this with her diet. Also not 100% compliant with her thyroid medication.  Review of Systems See HPI otherwise negative.  reports that she has been smoking Cigarettes.  She has been smoking about .3 packs per day. She does not have any smokeless tobacco history on file.     Objective:   Physical Exam  Vitals reviewed. Constitutional: She is oriented to person, place, and time. She appears well-developed and well-nourished. No distress.  HENT:  Head: Normocephalic and atraumatic.  Right Ear: External ear normal.  Left Ear: External ear normal.  Mouth/Throat: Oropharynx is clear and moist. No oropharyngeal exudate.  Eyes: Conjunctivae normal and EOM are normal. Pupils are equal, round, and reactive to light.       Right eye scant crust in corner. Battle sign. Conjunctivae slightly injected. No pain with EOM. Left eye appears wnl.  Neck: Normal range of motion. Neck supple.  Cardiovascular: Normal rate, regular rhythm  and normal heart sounds.   No murmur heard. Pulmonary/Chest: Effort normal and breath sounds normal. No respiratory distress. She has no wheezes.  Lymphadenopathy:    She has no cervical adenopathy.  Neurological: She is alert and oriented to person, place, and time.       Normal gait  Skin: No rash noted. She is not diaphoretic.  Psychiatric: She has a normal mood and affect. Her behavior is normal. Thought content normal.        Assessment & Plan:

## 2012-07-13 NOTE — Assessment & Plan Note (Signed)
Will try ophthalmology referral.

## 2012-07-13 NOTE — Patient Instructions (Addendum)
Your A1c is too high. If you don't lose the weight and decrease sugar, you will have diabetes complications. Will make ophthalmology referral.  Use drops for allergic eye symptoms. If symptoms persist or worsen, make appointment for follow up.

## 2012-07-13 NOTE — Assessment & Plan Note (Signed)
Improved, will monitor for return symptoms and consider neuro-rehab vs further workup if needed.

## 2012-07-14 NOTE — Addendum Note (Signed)
Addended by: Damita Lack on: 07/14/2012 09:16 AM   Modules accepted: Orders

## 2012-07-15 ENCOUNTER — Telehealth: Payer: Self-pay | Admitting: *Deleted

## 2012-07-15 NOTE — Telephone Encounter (Signed)
This requires office visit.

## 2012-07-15 NOTE — Telephone Encounter (Signed)
Informed pt that she will need to call to schedule an appt with [facility]. Pt is asking for a neurology referral for headaches.Kendra Hall [place]

## 2012-07-15 NOTE — Telephone Encounter (Signed)
Message copied by Deno Etienne on Wed Jul 15, 2012  3:28 PM ------      Message from: Durwin Reges      Created: Wed Jul 15, 2012  2:11 PM      Regarding: optho referral       Please inform patient to call [facility] to schedule a visit if she is having problem. She was already referred here in June. She has seen Dr. Doristine Section. Thanks.

## 2012-07-22 ENCOUNTER — Encounter: Payer: Self-pay | Admitting: Family Medicine

## 2012-07-22 ENCOUNTER — Ambulatory Visit (INDEPENDENT_AMBULATORY_CARE_PROVIDER_SITE_OTHER): Payer: Medicare Other | Admitting: Family Medicine

## 2012-07-22 VITALS — BP 128/83 | HR 84 | Temp 98.3°F | Wt 202.0 lb

## 2012-07-22 DIAGNOSIS — J069 Acute upper respiratory infection, unspecified: Secondary | ICD-10-CM

## 2012-07-22 MED ORDER — GUAIFENESIN 100 MG/5ML PO LIQD
200.0000 mg | Freq: Three times a day (TID) | ORAL | Status: DC | PRN
Start: 2012-07-22 — End: 2013-01-11

## 2012-07-22 NOTE — Patient Instructions (Signed)
You have a sinus infection and sore throat from a virus most likely. This will take several days to start improving. You can try nasal saline. Drink plenty of fluids. You can use OTC decongestant for symptoms. Make appointment if not improving in 5-7 days or if you have fevers, shortness of breath.   Upper Respiratory Infection, Adult An upper respiratory infection (URI) is also known as the common cold. It is often caused by a type of germ (virus). Colds are easily spread (contagious). You can pass it to others by kissing, coughing, sneezing, or drinking out of the same glass. Usually, you get better in 1 or 2 weeks.  HOME CARE   Only take medicine as told by your doctor.  Use a warm mist humidifier or breathe in steam from a hot shower.  Drink enough water and fluids to keep your pee (urine) clear or pale yellow.  Get plenty of rest.  Return to work when your temperature is back to normal or as told by your doctor. You may use a face mask and wash your hands to stop your cold from spreading. GET HELP RIGHT AWAY IF:   After the first few days, you feel you are getting worse.  You have questions about your medicine.  You have chills, shortness of breath, or brown or red spit (mucus).  You have yellow or brown snot (nasal discharge) or pain in the face, especially when you bend forward.  You have a fever, puffy (swollen) neck, pain when you swallow, or white spots in the back of your throat.  You have a bad headache, ear pain, sinus pain, or chest pain.  You have a high-pitched whistling sound when you breathe in and out (wheezing).  You have a lasting cough or cough up blood.  You have sore muscles or a stiff neck. MAKE SURE YOU:   Understand these instructions.  Will watch your condition.  Will get help right away if you are not doing well or get worse. Document Released: 11/27/2007 Document Revised: 09/02/2011 Document Reviewed: 10/15/2010 [facility] Patient  Information 2013 [place], [place].

## 2012-07-22 NOTE — Progress Notes (Signed)
  Subjective:    Patient ID: Kendra Hall, female    DOB: Nov 16, 1965, 47 y.o.   MRN: [id number]  HPI  Cough/congestion present for 2 days. Nonproductive cough and runny nose. Has a headache today. Denies purulent drainage, dyspnea, wheezing, facial pain, fever, chills, myalgias. No sick contacts but does visit with her toddler grandson frequently.  She is a smoker.  Review of Systems See HPI otherwise negative.  reports that she has been smoking Cigarettes.  She has been smoking about .3 packs per day. She does not have any smokeless tobacco history on file.'     Objective:   Physical Exam  Vitals reviewed. Constitutional: She is oriented to person, place, and time. She appears well-developed and well-nourished. No distress.  HENT:  Head: Normocephalic and atraumatic.  Right Ear: External ear normal.  Left Ear: External ear normal.  Mouth/Throat: Oropharynx is clear and moist. No oropharyngeal exudate.       Mild nasal clear discharge and congestion.  Eyes: Conjunctivae normal and EOM are normal. Pupils are equal, round, and reactive to light.  Neck: Normal range of motion. Neck supple. No thyromegaly present.       Shotty right ant cerv LAD  Cardiovascular: Normal rate, regular rhythm and normal heart sounds.   Pulmonary/Chest: Effort normal and breath sounds normal. No respiratory distress. She has no wheezes. She has no rales.  Neurological: She is alert and oriented to person, place, and time.  Psychiatric:       Patient whines about not wanting to miss school and becomes defensive when plan doesn't include antibiotics.        Assessment & Plan:

## 2012-07-22 NOTE — Assessment & Plan Note (Signed)
2 days of symptoms, no sign of bacterial etiology. Discussed supportive care, decongestants, recommended some OTC symptom care meds, nasal saline. RTC if not improving in 5-7 days or symptoms worsen. She is at risk for complications as a smoker, recommend cessation and f/u prn.

## 2012-07-25 ENCOUNTER — Other Ambulatory Visit: Payer: Self-pay | Admitting: Family Medicine

## 2012-07-27 ENCOUNTER — Ambulatory Visit (INDEPENDENT_AMBULATORY_CARE_PROVIDER_SITE_OTHER): Payer: Medicare Other | Admitting: Family Medicine

## 2012-07-27 ENCOUNTER — Encounter: Payer: Self-pay | Admitting: Family Medicine

## 2012-07-27 VITALS — BP 134/89 | HR 76 | Temp 98.3°F | Ht 63.0 in | Wt 209.0 lb

## 2012-07-27 DIAGNOSIS — J329 Chronic sinusitis, unspecified: Secondary | ICD-10-CM

## 2012-07-27 MED ORDER — AMOXICILLIN-POT CLAVULANATE 875-125 MG PO TABS: 1.0000 | ORAL_TABLET | Freq: Two times a day (BID) | ORAL | Status: DC

## 2012-07-27 MED ORDER — BENZONATATE 100 MG PO CAPS: 200.0000 mg | ORAL_CAPSULE | Freq: Two times a day (BID) | ORAL | Status: DC | PRN

## 2012-07-27 NOTE — Patient Instructions (Addendum)
I have sent in an antibiotic for you as well as tessalon  Sinusitis Sinusitis is redness, soreness, and swelling (inflammation) of the paranasal sinuses. Paranasal sinuses are air pockets within the bones of your face (beneath the eyes, the middle of the forehead, or above the eyes). In healthy paranasal sinuses, mucus is able to drain out, and air is able to circulate through them by way of your nose. However, when your paranasal sinuses are inflamed, mucus and air can become trapped. This can allow bacteria and other germs to grow and cause infection. Sinusitis can develop quickly and last only a short time (acute) or continue over a long period (chronic). Sinusitis that lasts for more than 12 weeks is considered chronic.  CAUSES  Causes of sinusitis include:  Allergies.  Structural abnormalities, such as displacement of the cartilage that separates your nostrils (deviated septum), which can decrease the air flow through your nose and sinuses and affect sinus drainage.  Functional abnormalities, such as when the small hairs (cilia) that line your sinuses and help remove mucus do not work properly or are not present. SYMPTOMS  Symptoms of acute and chronic sinusitis are the same. The primary symptoms are pain and pressure around the affected sinuses. Other symptoms include:  Upper toothache.  Earache.  Headache.  Bad breath.  Decreased sense of smell and taste.  A cough, which worsens when you are lying flat.  Fatigue.  Fever.  Thick drainage from your nose, which often is green and may contain pus (purulent).  Swelling and warmth over the affected sinuses. DIAGNOSIS  Your caregiver will perform a physical exam. During the exam, your caregiver may:  Look in your nose for signs of abnormal growths in your nostrils (nasal polyps).  Tap over the affected sinus to check for signs of infection.  View the inside of your sinuses (endoscopy) with a special imaging device with a  light attached (endoscope), which is inserted into your sinuses. If your caregiver suspects that you have chronic sinusitis, one or more of the following tests may be recommended:  Allergy tests.  Nasal culture A sample of mucus is taken from your nose and sent to a lab and screened for bacteria.  Nasal cytology A sample of mucus is taken from your nose and examined by your caregiver to determine if your sinusitis is related to an allergy. TREATMENT  Most cases of acute sinusitis are related to a viral infection and will resolve on their own within 10 days. Sometimes medicines are prescribed to help relieve symptoms (pain medicine, decongestants, nasal steroid sprays, or saline sprays).  However, for sinusitis related to a bacterial infection, your caregiver will prescribe antibiotic medicines. These are medicines that will help kill the bacteria causing the infection.  Rarely, sinusitis is caused by a fungal infection. In theses cases, your caregiver will prescribe antifungal medicine. For some cases of chronic sinusitis, surgery is needed. Generally, these are cases in which sinusitis recurs more than 3 times per year, despite other treatments. HOME CARE INSTRUCTIONS   Drink plenty of water. Water helps thin the mucus so your sinuses can drain more easily.  Use a humidifier.  Inhale steam 3 to 4 times a day (for example, sit in the bathroom with the shower running).  Apply a warm, moist washcloth to your face 3 to 4 times a day, or as directed by your caregiver.  Use saline nasal sprays to help moisten and clean your sinuses.  Take over-the-counter or prescription medicines for  pain, discomfort, or fever only as directed by your caregiver. SEEK IMMEDIATE MEDICAL CARE IF:  You have increasing pain or severe headaches.  You have nausea, vomiting, or drowsiness.  You have swelling around your face.  You have vision problems.  You have a stiff neck.  You have difficulty  breathing. MAKE SURE YOU:   Understand these instructions.  Will watch your condition.  Will get help right away if you are not doing well or get worse. Document Released: 06/10/2005 Document Revised: 09/02/2011 Document Reviewed: 06/25/2011 [facility] Patient Information 2013 [place], [place].

## 2012-07-30 DIAGNOSIS — J329 Chronic sinusitis, unspecified: Secondary | ICD-10-CM | POA: Insufficient documentation

## 2012-07-30 NOTE — Assessment & Plan Note (Signed)
Will treat with antibiotics given report of fever and second sickening.  Likely still has viral illness as well.  Encouraged to stop smoking.

## 2012-07-30 NOTE — Progress Notes (Signed)
  Subjective:    Patient ID: Kendra Hall, female    DOB: 1965/11/13, 47 y.o.   MRN: [id number]  HPI  1. Cough and congestion:  Here with complaint of continued cough and congestion.  Reports that she started to feel a little better then developed worsening of symptoms.  She endorses yellow drainage from her nose, temp to 100.5, headache, ,mild tooth pain and sinus pain.  She also has sore throat and body aches.  She denies nausea, vomiting, diarrhea, shortness of breath or wheezing.  She continues to smoke.   Review of Systems Per HPI    Objective:   Physical Exam        Assessment & Plan:

## 2012-10-21 ENCOUNTER — Other Ambulatory Visit: Payer: Self-pay | Admitting: Family Medicine

## 2012-10-26 NOTE — Telephone Encounter (Signed)
Called patient about xanax refill. 15 pills requested. Patient has 2 pills left. She says sometimes 15 pills last a whole month and sometimes they don't.   Plan: Refill 15 pills Patient will f/u with Dr. Cristal Ford, office visit may be needed.  She will call in.

## 2012-11-13 ENCOUNTER — Ambulatory Visit: Payer: Medicare Other | Admitting: Family Medicine

## 2012-11-13 ENCOUNTER — Encounter: Payer: Self-pay | Admitting: Family Medicine

## 2012-11-20 ENCOUNTER — Ambulatory Visit: Payer: Medicare Other | Admitting: Family Medicine

## 2012-12-15 DIAGNOSIS — E119 Type 2 diabetes mellitus without complications: Secondary | ICD-10-CM | POA: Diagnosis not present

## 2012-12-31 ENCOUNTER — Other Ambulatory Visit: Payer: Self-pay

## 2013-01-11 ENCOUNTER — Encounter (HOSPITAL_COMMUNITY): Payer: Self-pay | Admitting: *Deleted

## 2013-01-11 ENCOUNTER — Emergency Department (HOSPITAL_COMMUNITY)
Admission: EM | Admit: 2013-01-11 | Discharge: 2013-01-12 | Disposition: A | Discharge status: Home / Self Care | Payer: Medicare Other | Attending: Emergency Medicine | Admitting: Emergency Medicine

## 2013-01-11 DIAGNOSIS — Z79899 Other long term (current) drug therapy: Secondary | ICD-10-CM | POA: Diagnosis not present

## 2013-01-11 DIAGNOSIS — F17200 Nicotine dependence, unspecified, uncomplicated: Secondary | ICD-10-CM | POA: Insufficient documentation

## 2013-01-11 DIAGNOSIS — Z8659 Personal history of other mental and behavioral disorders: Secondary | ICD-10-CM | POA: Insufficient documentation

## 2013-01-11 DIAGNOSIS — Z8739 Personal history of other diseases of the musculoskeletal system and connective tissue: Secondary | ICD-10-CM | POA: Insufficient documentation

## 2013-01-11 DIAGNOSIS — R509 Fever, unspecified: Secondary | ICD-10-CM | POA: Insufficient documentation

## 2013-01-11 DIAGNOSIS — G43909 Migraine, unspecified, not intractable, without status migrainosus: Secondary | ICD-10-CM | POA: Diagnosis not present

## 2013-01-11 DIAGNOSIS — R51 Headache: Secondary | ICD-10-CM | POA: Diagnosis not present

## 2013-01-11 DIAGNOSIS — Z8719 Personal history of other diseases of the digestive system: Secondary | ICD-10-CM | POA: Diagnosis not present

## 2013-01-11 DIAGNOSIS — E119 Type 2 diabetes mellitus without complications: Secondary | ICD-10-CM | POA: Insufficient documentation

## 2013-01-11 DIAGNOSIS — R5381 Other malaise: Secondary | ICD-10-CM | POA: Insufficient documentation

## 2013-01-11 DIAGNOSIS — Z3202 Encounter for pregnancy test, result negative: Secondary | ICD-10-CM | POA: Diagnosis not present

## 2013-01-11 DIAGNOSIS — E0500 Thyrotoxicosis with diffuse goiter without thyrotoxic crisis or storm: Secondary | ICD-10-CM | POA: Diagnosis not present

## 2013-01-11 LAB — CBC WITH DIFFERENTIAL/PLATELET
Basophils Absolute: 0 10*3/uL (ref 0.0–0.1)
Basophils Relative: 0 % (ref 0–1)
Eosinophils Absolute: 0 10*3/uL (ref 0.0–0.7)
Eosinophils Relative: 0 % (ref 0–5)
HCT: 35.7 % — ABNORMAL LOW (ref 36.0–46.0)
Hemoglobin: 11.9 g/dL — ABNORMAL LOW (ref 12.0–15.0)
Lymphocytes Relative: 23 % (ref 12–46)
Lymphs Abs: 1.5 10*3/uL (ref 0.7–4.0)
MCH: 25.9 pg — ABNORMAL LOW (ref 26.0–34.0)
MCHC: 33.3 g/dL (ref 30.0–36.0)
MCV: 77.6 fL — ABNORMAL LOW (ref 78.0–100.0)
Monocytes Absolute: 0.7 10*3/uL (ref 0.1–1.0)
Monocytes Relative: 10 % (ref 3–12)
Neutro Abs: 4.5 10*3/uL (ref 1.7–7.7)
Neutrophils Relative %: 67 % (ref 43–77)
Platelets: 206 10*3/uL (ref 150–400)
RBC: 4.6 MIL/uL (ref 3.87–5.11)
RDW: 15.6 % — ABNORMAL HIGH (ref 11.5–15.5)
WBC: 6.7 10*3/uL (ref 4.0–10.5)

## 2013-01-11 LAB — COMPREHENSIVE METABOLIC PANEL
ALT: 11 U/L (ref 0–35)
AST: 18 U/L (ref 0–37)
Albumin: 3.6 g/dL (ref 3.5–5.2)
Alkaline Phosphatase: 110 U/L (ref 39–117)
BUN: 9 mg/dL (ref 6–23)
CO2: 30 mEq/L (ref 19–32)
Calcium: 9 mg/dL (ref 8.4–10.5)
Chloride: 100 mEq/L (ref 96–112)
Creatinine, Ser: 1.11 mg/dL — ABNORMAL HIGH (ref 0.50–1.10)
GFR calc Af Amer: 67 mL/min — ABNORMAL LOW (ref >90)
GFR calc non Af Amer: 58 mL/min — ABNORMAL LOW (ref >90)
Glucose, Bld: 136 mg/dL — ABNORMAL HIGH (ref 70–99)
Potassium: 3.9 mEq/L (ref 3.5–5.1)
Sodium: 137 mEq/L (ref 135–145)
Total Bilirubin: 0.4 mg/dL (ref 0.3–1.2)
Total Protein: 8 g/dL (ref 6.0–8.3)

## 2013-01-11 MED ORDER — ACETAMINOPHEN 325 MG PO TABS
650.0000 mg | ORAL_TABLET | Freq: Once | ORAL | Status: AC
  Administered 2013-01-11: 650 mg via ORAL
  Filled 2013-01-11: qty 2

## 2013-01-11 MED ORDER — SODIUM CHLORIDE 0.9 % IV BOLUS (SEPSIS)
1000.0000 mL | Freq: Once | INTRAVENOUS | Status: AC
  Administered 2013-01-12: 1000 mL via INTRAVENOUS

## 2013-01-11 NOTE — ED Notes (Signed)
Nurse First Rounds: nurse explained delay , process and wait time , pt. At waiting area with no distress / respirations unlabored.

## 2013-01-11 NOTE — ED Notes (Signed)
Pt states migraine HA for the past three days. Pt states she has tried medicatiosn with no relief. Pt states she is havign generalized body aches as well for the psat three days. Pt ambualtory alert and oriented, able to follow commands and move extremities.

## 2013-01-11 NOTE — ED Provider Notes (Signed)
History    CSN: [id number] Arrival date & time 01/11/13  1946  First MD Initiated Contact with Patient 01/11/13 2330     Chief Complaint  Patient presents with  . Flu-Like Symptoms    (Consider location/radiation/quality/duration/timing/severity/associated sxs/prior Treatment) HPI She began feeling badly yesterday morning with generalized body aches, malaise, subjective fever and the sensation that she has been stung on the right side of her back. Symptoms are described as moderate. She developed a migraine headache yesterday which has significantly improved after taking Maxalt. She continues to have a mild headache. She denies sore throat, cold symptoms, cough, shortness of breath, nausea, vomiting, diarrhea, abdominal pain or dysuria. She does have the sensation of acid reflux when she takes a deep breath. She noted to be febrile on arrival with improvement after being given acetaminophen. She states she has not been eating and drinking as well as she should.  Past Medical History  Diagnosis Date  . Allergy   . Depression   . Diabetes mellitus   . Osteoarthritis     Facet hypertrophy with injections  . Multiple thyroid nodules   . Gastric ulcer   . Migraine   . Grave's disease    Past Surgical History  Procedure Laterality Date  . Tubal ligation    . Axillary lymph node dissection  2001   Family History  Problem Relation Age of Onset  . Hypertension Mother   . Diabetes Mother   . Cancer Mother     rectal  . Heart disease Father 8    MI x5  . Hypertension Father   . Diabetes Father   . Cancer Sister 51    breast   History  Substance Use Topics  . Smoking status: Current Every Day Smoker -- 0.30 packs/day    Types: Cigarettes  . Smokeless tobacco: Not on file  . Alcohol Use: No   OB History   Grav Para Term Preterm Abortions TAB SAB Ect Mult Living                 Review of Systems  All other systems reviewed and are negative.    Allergies  Review of  patient's allergies indicates no known allergies.  Home Medications   Current Outpatient Rx  Name  Route  Sig  Dispense  Refill  . albuterol (PROAIR HFA) 108 (90 BASE) MCG/ACT inhaler   Inhalation   Inhale 2 puffs into the lungs every 4 (four) hours as needed for wheezing.   1 Inhaler   5   . levothyroxine (SYNTHROID, LEVOTHROID) 150 MCG tablet   Oral   Take 150 mcg by mouth daily before breakfast.         . oxyCODONE-acetaminophen (PERCOCET) 5-325 MG per tablet   Oral   Take 1 tablet by mouth daily as needed for pain.   15 tablet   0   . rizatriptan (MAXALT-MLT) 10 MG disintegrating tablet   Oral   Take 1 tablet (10 mg total) by mouth as needed for migraine. May repeat in 2 hours if needed   15 tablet   1     Needs office visit prior to refills    BP 126/76  Pulse 87  Temp(Src) 100.1 F (37.8 C) (Oral)  Resp 14  SpO2 98%  LMP 01/06/2013  Physical Exam General: Well-developed, well-nourished female in no acute distress; appearance consistent with age of record HENT: normocephalic, atraumatic; no pharyngeal erythema or exudate Eyes: pupils equal round and reactive to  light; extraocular muscles intact Neck: supple; no lymphadenopathy; no meningeal signs Heart: regular rate and rhythm; no murmurs, rubs or gallops Lungs: clear to auscultation bilaterally Abdomen: soft; nondistended; nontender; no masses or hepatosplenomegaly; bowel sounds present Extremities: No deformity; full range of motion; pulses normal Neurologic: Awake, alert and oriented; motor function intact in all extremities and symmetric; no facial droop Skin: Warm and dry Psychiatric: Normal mood and affect    ED Course  Procedures (including critical care time)    MDM   Nursing notes and vitals signs, including pulse oximetry, reviewed.  Summary of this visit's results, reviewed by myself:  Labs:  Results for orders placed during the hospital encounter of 01/11/13 (from the past 24  hour(s))  CBC WITH DIFFERENTIAL     Status: Abnormal   Collection Time    01/11/13  8:07 PM      Result Value Range   WBC 6.7  4.0 - 10.5 K/uL   RBC 4.60  3.87 - 5.11 MIL/uL   Hemoglobin 11.9 (*) 12.0 - 15.0 g/dL   HCT 04.5 (*) 40.9 - 81.1 %   MCV 77.6 (*) 78.0 - 100.0 fL   MCH 25.9 (*) 26.0 - 34.0 pg   MCHC 33.3  30.0 - 36.0 g/dL   RDW 91.4 (*) 78.2 - 95.6 %   Platelets 206  150 - 400 K/uL   Neutrophils Relative % 67  43 - 77 %   Neutro Abs 4.5  1.7 - 7.7 K/uL   Lymphocytes Relative 23  12 - 46 %   Lymphs Abs 1.5  0.7 - 4.0 K/uL   Monocytes Relative 10  3 - 12 %   Monocytes Absolute 0.7  0.1 - 1.0 K/uL   Eosinophils Relative 0  0 - 5 %   Eosinophils Absolute 0.0  0.0 - 0.7 K/uL   Basophils Relative 0  0 - 1 %   Basophils Absolute 0.0  0.0 - 0.1 K/uL  COMPREHENSIVE METABOLIC PANEL     Status: Abnormal   Collection Time    01/11/13  8:07 PM      Result Value Range   Sodium 137  135 - 145 mEq/L   Potassium 3.9  3.5 - 5.1 mEq/L   Chloride 100  96 - 112 mEq/L   CO2 30  19 - 32 mEq/L   Glucose, Bld 136 (*) 70 - 99 mg/dL   BUN 9  6 - 23 mg/dL   Creatinine, Ser 2.13 (*) 0.50 - 1.10 mg/dL   Calcium 9.0  8.4 - 08.6 mg/dL   Total Protein 8.0  6.0 - 8.3 g/dL   Albumin 3.6  3.5 - 5.2 g/dL   AST 18  0 - 37 U/L   ALT 11  0 - 35 U/L   Alkaline Phosphatase 110  39 - 117 U/L   Total Bilirubin 0.4  0.3 - 1.2 mg/dL   GFR calc non Af Amer 58 (*) >90 mL/min   GFR calc Af Amer 67 (*) >90 mL/min  RAPID STREP SCREEN     Status: None   Collection Time    01/11/13 11:50 PM      Result Value Range   Streptococcus, Group A Screen (Direct) NEGATIVE  NEGATIVE  URINALYSIS, ROUTINE W REFLEX MICROSCOPIC     Status: Abnormal   Collection Time    01/12/13 12:58 AM      Result Value Range   Color, Urine AMBER (*) YELLOW   APPearance CLOUDY (*) CLEAR  Specific Gravity, Urine 1.019  1.005 - 1.030   pH 6.0  5.0 - 8.0   Glucose, UA NEGATIVE  NEGATIVE mg/dL   Hgb urine dipstick LARGE (*) NEGATIVE    Bilirubin Urine SMALL (*) NEGATIVE   Ketones, ur 15 (*) NEGATIVE mg/dL   Protein, ur NEGATIVE  NEGATIVE mg/dL   Urobilinogen, UA 1.0  0.0 - 1.0 mg/dL   Nitrite NEGATIVE  NEGATIVE   Leukocytes, UA SMALL (*) NEGATIVE  PREGNANCY, URINE     Status: None   Collection Time    01/12/13 12:58 AM      Result Value Range   Preg Test, Ur NEGATIVE  NEGATIVE  URINE MICROSCOPIC-ADD ON     Status: Abnormal   Collection Time    01/12/13 12:58 AM      Result Value Range   Squamous Epithelial / LPF MANY (*) RARE   WBC, UA 3-6  <3 WBC/hpf   RBC / HPF 0-2  <3 RBC/hpf   Bacteria, UA MANY (*) RARE    Imaging Studies: Dg Chest 2 View  01/12/2013   *RADIOLOGY REPORT*  Clinical Data: Fever.  CHEST - 2 VIEW  Comparison: CTA chest 01/10/2012.  Findings: The heart size is normal.  The lungs are clear.  The visualized soft tissues and bony thorax are unremarkable.  IMPRESSION: Negative two-view chest.   Original Report Authenticated By: Marin Roberts, M.D.    1:44 AM Patient feels better after IV fluid bolus. There is no obvious source for her fever. Viral infection seems most likely but her urinalysis is abnormal and could represent an early or mild urinary tract infection. We will treat her for this.  Hanley Seamen, MD 01/12/13 0145

## 2013-01-11 NOTE — ED Notes (Signed)
MD Molpus at bedside. 

## 2013-01-12 ENCOUNTER — Emergency Department (HOSPITAL_COMMUNITY): Payer: Medicare Other

## 2013-01-12 DIAGNOSIS — R509 Fever, unspecified: Secondary | ICD-10-CM | POA: Diagnosis not present

## 2013-01-12 LAB — URINALYSIS, ROUTINE W REFLEX MICROSCOPIC
Glucose, UA: NEGATIVE mg/dL
Ketones, ur: 15 mg/dL — AB
Nitrite: NEGATIVE
Protein, ur: NEGATIVE mg/dL
Specific Gravity, Urine: 1.019 (ref 1.005–1.030)
Urobilinogen, UA: 1 mg/dL (ref 0.0–1.0)
pH: 6 (ref 5.0–8.0)

## 2013-01-12 LAB — URINE MICROSCOPIC-ADD ON

## 2013-01-12 LAB — PREGNANCY, URINE: Preg Test, Ur: NEGATIVE

## 2013-01-12 LAB — RAPID STREP SCREEN (MED CTR MEBANE ONLY): Streptococcus, Group A Screen (Direct): NEGATIVE

## 2013-01-12 MED ORDER — NITROFURANTOIN MONOHYD MACRO 100 MG PO CAPS: 100.0000 mg | ORAL_CAPSULE | Freq: Two times a day (BID) | ORAL | Status: DC

## 2013-01-12 MED ORDER — NITROFURANTOIN MONOHYD MACRO 100 MG PO CAPS
100.0000 mg | ORAL_CAPSULE | Freq: Once | ORAL | Status: AC
  Administered 2013-01-12: 100 mg via ORAL
  Filled 2013-01-12: qty 1

## 2013-01-12 NOTE — ED Notes (Signed)
Patient transported to X-ray 

## 2013-01-12 NOTE — ED Notes (Signed)
Attempt x2 to start IV, unsuccessful, Samantha RN at bedside to insert IV

## 2013-01-12 NOTE — ED Notes (Signed)
Pt dc'd home w/all belongings, alert and ambulatory upon dc, 1 new rx prescribed pt verbalizes understanding of dc instructions, driven home by friend

## 2013-01-12 NOTE — ED Notes (Signed)
Pt is aware of the need for a urine sample, states "I can't go right now."

## 2013-01-12 NOTE — ED Notes (Signed)
Pt c/o migraine headache x3 days, nausea and body aches starting last night. Pt denies cough, congestion, fever, sore throat, or ear pain

## 2013-01-13 ENCOUNTER — Encounter: Payer: Self-pay | Admitting: Family Medicine

## 2013-01-13 ENCOUNTER — Ambulatory Visit (INDEPENDENT_AMBULATORY_CARE_PROVIDER_SITE_OTHER): Payer: Medicare Other | Admitting: Family Medicine

## 2013-01-13 VITALS — BP 150/85 | HR 77 | Temp 98.1°F | Ht 63.0 in | Wt 202.4 lb

## 2013-01-13 DIAGNOSIS — N390 Urinary tract infection, site not specified: Secondary | ICD-10-CM

## 2013-01-13 DIAGNOSIS — G43709 Chronic migraine without aura, not intractable, without status migrainosus: Secondary | ICD-10-CM | POA: Diagnosis not present

## 2013-01-13 LAB — URINE CULTURE: Colony Count: >=100000

## 2013-01-13 LAB — CULTURE, GROUP A STREP

## 2013-01-13 MED ORDER — RIZATRIPTAN BENZOATE 10 MG PO TBDP: 10.0000 mg | ORAL_TABLET | ORAL | Status: DC | PRN

## 2013-01-13 MED ORDER — CEPHALEXIN 500 MG PO CAPS: 500.0000 mg | ORAL_CAPSULE | Freq: Four times a day (QID) | ORAL | Status: DC

## 2013-01-13 NOTE — Patient Instructions (Addendum)
You likely have a viral gastroenteritis. This is going to cause you to have a headache, stomach ache and loose stool. Take tylenol for discomfort and fever as directed on the bottle. Drink plenty of water (9 or more glasses a day).  You also have a UTI and I have called in a new antibiotic treatment for you. Take it 4 times a day for 7 days.   F/U as needed with your PCP if your symptoms do not resolve in 1 week,.

## 2013-01-13 NOTE — Progress Notes (Signed)
Subjective:     Patient ID: Kendra Hall, female   DOB: 15-Dec-1965, 47 y.o.   MRN: [id number]  HPI  Headache and ear ache:  Patient was lying on table with eyes closed when I entered the room. Patient reports recent flu-like illness that took her to the ED on the 21st of this month. At the ED she was given an IVFB and showed improvement. Since her ED visit she reports the right side of her head and ear starting hurting. She reports it feels like when she has a migraine but she has been out of her maxalt, and that usually works for her. She reports feeling feverish, but did not take her temperature. Denies photophobia. She has decrease appetite since prior to ED visit with nausea. Her last LMP was 7/13. She denies vomit, diarrhea or current abdominal pain. An UA was obtained in the ED and showed signs of UTI. She was given Macrobid in the ED. She is suppose to take a trip to [place] tomorrow and asks if I would tell her sister she can not make it. She does not report frequency or burning with urination.   Patient's past medical, social, and family history were reviewed and updated as appropriate.  Review of Systems Negative, with the exception of above mentioned in HPI      Objective:   Physical Exam BP 150/85  Pulse 77  Temp(Src) 98.1 F (36.7 C) (Oral)  Ht 5\' 3"  (1.6 m)  Wt 202 lb 6.4 oz (91.808 kg)  BMI 35.86 kg/m2  LMP 01/06/2013 Gen: NAD. Initially lying on table when I entered in the room, appeared ill. Her demeanor changed after her child left the room.    CV: RRR Chest: CTAB Abd: Soft. NTND. BS present

## 2013-01-14 ENCOUNTER — Other Ambulatory Visit: Payer: Self-pay | Admitting: Family Medicine

## 2013-01-16 NOTE — Assessment & Plan Note (Signed)
-   UA obtained in ED. Patient given Macrobid treatment. No improvement per patient.  - Urine culture >100K mixed bacteria.  - Called in Keflex treatment for 7 days.  - F/U: If symptoms worsen or do not improve in 7 days.

## 2013-01-16 NOTE — Assessment & Plan Note (Addendum)
-   refilled maxalt today for patient. She is requesting disintergrating tabs, states the others do not work as well.  - Possibly migraine headache today. Contributing factors of dehydration from recent viral infection.  - encourage plenty of water and/or fluids to stay hydrated.  - F/u PRN

## 2013-03-19 ENCOUNTER — Ambulatory Visit (INDEPENDENT_AMBULATORY_CARE_PROVIDER_SITE_OTHER): Payer: Medicare Other | Admitting: Family Medicine

## 2013-03-19 ENCOUNTER — Encounter: Payer: Self-pay | Admitting: Family Medicine

## 2013-03-19 ENCOUNTER — Ambulatory Visit (HOSPITAL_COMMUNITY)
Admission: RE | Admit: 2013-03-19 | Discharge: 2013-03-19 | Disposition: A | Discharge status: Home / Self Care | Payer: Medicare Other | Source: Ambulatory Visit | Attending: Family Medicine | Admitting: Family Medicine

## 2013-03-19 VITALS — BP 143/80 | HR 74 | Temp 98.0°F | Ht 63.0 in | Wt 204.0 lb

## 2013-03-19 DIAGNOSIS — M25561 Pain in right knee: Secondary | ICD-10-CM

## 2013-03-19 DIAGNOSIS — E119 Type 2 diabetes mellitus without complications: Secondary | ICD-10-CM | POA: Diagnosis not present

## 2013-03-19 DIAGNOSIS — M25469 Effusion, unspecified knee: Secondary | ICD-10-CM | POA: Diagnosis not present

## 2013-03-19 DIAGNOSIS — E039 Hypothyroidism, unspecified: Secondary | ICD-10-CM | POA: Diagnosis not present

## 2013-03-19 DIAGNOSIS — M25569 Pain in unspecified knee: Secondary | ICD-10-CM | POA: Insufficient documentation

## 2013-03-19 DIAGNOSIS — E890 Postprocedural hypothyroidism: Secondary | ICD-10-CM | POA: Diagnosis not present

## 2013-03-19 DIAGNOSIS — M7989 Other specified soft tissue disorders: Secondary | ICD-10-CM | POA: Diagnosis not present

## 2013-03-19 LAB — POCT GLYCOSYLATED HEMOGLOBIN (HGB A1C): Hemoglobin A1C: 7.9

## 2013-03-19 LAB — TSH: TSH: 27.257 u[IU]/mL — ABNORMAL HIGH (ref 0.350–4.500)

## 2013-03-19 MED ORDER — GLIMEPIRIDE 1 MG PO TABS: 1.0000 mg | ORAL_TABLET | Freq: Every day | ORAL | Status: DC

## 2013-03-19 MED ORDER — METFORMIN HCL ER 500 MG PO TB24: 500.0000 mg | ORAL_TABLET | Freq: Every day | ORAL | Status: DC

## 2013-03-19 MED ORDER — IBUPROFEN 600 MG PO TABS: 600.0000 mg | ORAL_TABLET | Freq: Three times a day (TID) | ORAL | Status: DC | PRN

## 2013-03-19 MED ORDER — NAPROXEN 500 MG PO TABS: 500.0000 mg | ORAL_TABLET | Freq: Two times a day (BID) | ORAL | Status: DC

## 2013-03-19 NOTE — Patient Instructions (Addendum)
It was nice to see you today.    I am prescribing 2 medications for your diabetes.  Please take them to [facility]. You can get them FREE there.  In regards to your knee, your exam is benign.  I am getting an xray and I am giving you Ibuprofen for the pain.  Follow up if this worsens or does not improve.

## 2013-03-21 DIAGNOSIS — M25561 Pain in right knee: Secondary | ICD-10-CM | POA: Insufficient documentation

## 2013-03-21 MED ORDER — LEVOTHYROXINE SODIUM 175 MCG PO TABS: 175.0000 ug | ORAL_TABLET | Freq: Every day | ORAL | Status: DC

## 2013-03-21 NOTE — Assessment & Plan Note (Signed)
TSH elevated today. Increasing Synthroid to 175.

## 2013-03-21 NOTE — Assessment & Plan Note (Signed)
A1C 7.9 today. Patient started on Amaryl 1 mg.  Will also try a trial of Metformin XR.

## 2013-03-21 NOTE — Progress Notes (Signed)
Subjective:     Patient ID: Kendra Hall, female   DOB: [birth year], 47 y.o.   MRN: [id number]  HPI 47 year old female presents for followup.  1) DM-2 - Patient has a history of DM - She has been on Metformin in the past, but this was D/C'd due to intolerance (diarrhea). - Patient has not been on any other medication.  She has been using diet to manage her diabetes. - She does not check her blood sugar. - No polyuria, polydipsia. No symptoms of hypoglycemia reported.  2) Right knee pain - Patient reports severe right knee pain; This began 2 days ago - No recent trauma, fall, or injury. She is unsure of the inciting event - She reports difficulty ambulating due to pain.  - She noted some swelling initially but denies swelling today. - No redness. - No recent rash, fevers, chills.  No other joints affected.  History   Social History  . Marital Status: Single    Spouse Name: N/A    Number of Children: N/A  . Years of Education: N/A   Social History Main Topics  . Smoking status: Current Every Day Smoker -- 0.30 packs/day    Types: Cigarettes  . Smokeless tobacco: None  . Alcohol Use: No  . Drug Use: No  . Sexual Activity: Yes   Other Topics Concern  . None   Social History Narrative   lives with her daughter and son ages 64 and 37, and grandson age 73.  She is currently unemployed.  Enjoys reading.  originally from [place].  moved here to [place] in 2007.  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              Review of Systems Per HPI    Objective:   Physical Exam Filed Vitals:   03/19/13 1558  BP: 143/80  Pulse: 74  Temp: 98 F (36.7 C)   Exam: General: well appearing female in NAD. Extremities: Right knee: decreased ROM secondary to pain.  Negative lachman's test.  No laxity with varus and valgus stress.  No redness, swelling or effusion noted.      Assessment:     See Problem List     Plan:

## 2013-03-21 NOTE — Assessment & Plan Note (Addendum)
Xray obtained today to evaluate for underlying pathology.  Xray interpreted by me.  It was negative for fracture.  Unclear etiology of pain. Rx given for Naproxen for pain.  Patient advised to follow up closely.

## 2013-03-24 ENCOUNTER — Telehealth: Payer: Self-pay | Admitting: Family Medicine

## 2013-03-24 NOTE — Telephone Encounter (Signed)
Pt would like results of xrays done on Friday.

## 2013-03-25 NOTE — Telephone Encounter (Signed)
Pt called again about xray results. Wondering what is taking so long. She is still limping around-unable to work up and down steps Please advise

## 2013-03-25 NOTE — Telephone Encounter (Signed)
Xray's are negative. I'm unsure about the etiology of her pain.

## 2013-03-31 ENCOUNTER — Other Ambulatory Visit: Payer: Self-pay | Admitting: *Deleted

## 2013-03-31 DIAGNOSIS — G43709 Chronic migraine without aura, not intractable, without status migrainosus: Secondary | ICD-10-CM

## 2013-03-31 MED ORDER — RIZATRIPTAN BENZOATE 10 MG PO TBDP: 10.0000 mg | ORAL_TABLET | ORAL | Status: DC | PRN

## 2013-03-31 NOTE — Telephone Encounter (Signed)
Patient called and informed at [facility] results and levothyroxine dosage increase from 150 mcg to 175 mcg.  Patient requesting refill of Maxalt due to migraine headache.  Will route refill request to Dr. Adriana Simas.  Gaylene Brooks, RN

## 2013-05-06 ENCOUNTER — Encounter: Payer: Self-pay | Admitting: Family Medicine

## 2013-05-06 ENCOUNTER — Telehealth: Payer: Self-pay | Admitting: Family Medicine

## 2013-05-06 ENCOUNTER — Ambulatory Visit (INDEPENDENT_AMBULATORY_CARE_PROVIDER_SITE_OTHER): Payer: Medicare Other | Admitting: Family Medicine

## 2013-05-06 VITALS — BP 141/66 | HR 75 | Temp 99.0°F | Ht 63.0 in | Wt 201.0 lb

## 2013-05-06 DIAGNOSIS — R252 Cramp and spasm: Secondary | ICD-10-CM

## 2013-05-06 DIAGNOSIS — E119 Type 2 diabetes mellitus without complications: Secondary | ICD-10-CM | POA: Diagnosis not present

## 2013-05-06 DIAGNOSIS — E039 Hypothyroidism, unspecified: Secondary | ICD-10-CM

## 2013-05-06 LAB — BASIC METABOLIC PANEL
BUN: 16 mg/dL (ref 6–23)
CO2: 27 mEq/L (ref 19–32)
Calcium: 8.9 mg/dL (ref 8.4–10.5)
Chloride: 105 mEq/L (ref 96–112)
Creat: 0.96 mg/dL (ref 0.50–1.10)
Glucose, Bld: 144 mg/dL — ABNORMAL HIGH (ref 70–99)
Potassium: 4.6 mEq/L (ref 3.5–5.3)
Sodium: 141 mEq/L (ref 135–145)

## 2013-05-06 LAB — MAGNESIUM: Magnesium: 1.8 mg/dL (ref 1.5–2.5)

## 2013-05-06 MED ORDER — METFORMIN HCL ER 500 MG PO TB24: 500.0000 mg | ORAL_TABLET | Freq: Every day | ORAL | Status: DC

## 2013-05-06 MED ORDER — GLIMEPIRIDE 2 MG PO TABS: 2.0000 mg | ORAL_TABLET | Freq: Every day | ORAL | Status: DC

## 2013-05-06 MED ORDER — CANAGLIFLOZIN 100 MG PO TABS: 1.0000 | ORAL_TABLET | Freq: Every day | ORAL | Status: DC

## 2013-05-06 NOTE — Assessment & Plan Note (Signed)
Patient not compliant with Metformin or Amaryl. Discussed Invokana today and patient would like to start. Will follow up in 3 months.

## 2013-05-06 NOTE — Telephone Encounter (Signed)
Says dr Adriana Simas was suppose to call in rx for invocana to [facility] at the visit today. It has not been received by [facility] [address]. She would also like her lab results Please advise

## 2013-05-06 NOTE — Patient Instructions (Signed)
It was nice to see you today.  You are experiencing muscle cramps.  We will obtain some labs today.  Please stretch the area and try a spoonful of mustard for relief.   Additionally, we need to get your thyroid and diabetes under control.  I am checking your TSH today.  I am also increasing your Amaryl to 2 mg daily.    Muscle Cramps and Spasms Muscle cramps and spasms occur when a muscle or muscles tighten and you have no control over this tightening (involuntary muscle contraction). They are a common problem and can develop in any muscle. The most common place is in the calf muscles of the leg. Both muscle cramps and muscle spasms are involuntary muscle contractions, but they also have differences:   Muscle cramps are sporadic and painful. They may last a few seconds to a quarter of an hour. Muscle cramps are often more forceful and last longer than muscle spasms.  Muscle spasms may or may not be painful. They may also last just a few seconds or much longer. CAUSES  It is uncommon for cramps or spasms to be due to a serious underlying problem. In many cases, the cause of cramps or spasms is unknown. Some common causes are:   Overexertion.   Overuse from repetitive motions (doing the same thing over and over).   Remaining in a certain position for a long period of time.   Improper preparation, form, or technique while performing a sport or activity.   Dehydration.   Injury.   Side effects of some medicines.   Abnormally low levels of the salts and ions in your blood (electrolytes), especially potassium and calcium. This could happen if you are taking water pills (diuretics) or you are pregnant.  Some underlying medical problems can make it more likely to develop cramps or spasms. These include, but are not limited to:   Diabetes.   Parkinson disease.   Hormone disorders, such as thyroid problems.   Alcohol abuse.   Diseases specific to muscles, joints, and bones.    Blood vessel disease where not enough blood is getting to the muscles.  HOME CARE INSTRUCTIONS   Stay well hydrated. Drink enough water and fluids to keep your urine clear or pale yellow.  It may be helpful to massage, stretch, and relax the affected muscle.  For tight or tense muscles, use a warm towel, heating pad, or hot shower water directed to the affected area.  If you are sore or have pain after a cramp or spasm, applying ice to the affected area may relieve discomfort.  Put ice in a plastic bag.  Place a towel between your skin and the bag.  Leave the ice on for 15-20 minutes, 03-04 times a day.  Medicines used to treat a known cause of cramps or spasms may help reduce their frequency or severity. Only take over-the-counter or prescription medicines as directed by your caregiver. SEEK MEDICAL CARE IF:  Your cramps or spasms get more severe, more frequent, or do not improve over time.  MAKE SURE YOU:   Understand these instructions.  Will watch your condition.  Will get help right away if you are not doing well or get worse. Document Released: 11/30/2001 Document Revised: 10/05/2012 Document Reviewed: 05/27/2012 [facility] Patient Information 2014 [place], [place].

## 2013-05-06 NOTE — Telephone Encounter (Signed)
Please advise. Kendra Hall  

## 2013-05-06 NOTE — Assessment & Plan Note (Signed)
Checking BMP, TSH, and Mag today. Stretching advised, as well as control of diabetes and hypothyroidism.

## 2013-05-06 NOTE — Progress Notes (Signed)
Subjective:     Patient ID: Kendra Hall, female   DOB: Oct 30, 1965, 47 y.o.   MRN: [id number]  HPI 47 year old female with type 2 diabetes and hypothyroidism presents for evaluation of right arm pain.  1) Right arm pain - Patient reports this has been going on for over one year.  It occurs 3-4 times daily. - She states that her right arm intermittently "locks up."  She states that when this occurs the muscle of her upper forearm is very tense and painful.  This subsequently resolves spontaneously in less than a minute. - No treatments have been tried.  No exacerbating or relieving factors.   2) DM-2 - Uncontrolled with last A1c of 7.9. - Patient reports that she is not taking any medication for her diabetes.  Review of Systems No reported fever, chills, nausea, vomiting, diarrhea, weight loss.  No reports of polyuria or polydipsia.     Objective:   Physical Exam Filed Vitals:   05/06/13 1100  BP: 141/66  Pulse: 75  Temp: 99 F (37.2 C)  Exam: General: well appearing female in NAD. MSK: Right arm - normal in appearance.  No tenderness to palpation.  Full ROM.     Assessment/Plan     See Problem List   A total of 25 minutes were spent face-to-face with the patient during this encounter and over half of that time was spent on counseling and coordination of care.

## 2013-05-07 LAB — TSH: TSH: 1.306 u[IU]/mL (ref 0.350–4.500)

## 2013-05-07 MED ORDER — CANAGLIFLOZIN 100 MG PO TABS: 1.0000 | ORAL_TABLET | Freq: Every day | ORAL | Status: DC

## 2013-05-07 NOTE — Telephone Encounter (Signed)
I did send the medication via EHR yesterday, but I will do it again.  In regards to her lab results, her TSH was normal and her metabolic panel was normal (other than elevated glucose).

## 2013-05-10 ENCOUNTER — Telehealth: Payer: Self-pay | Admitting: Family Medicine

## 2013-05-10 NOTE — Telephone Encounter (Signed)
Pt called and would like to know what her recent lab results are. jw

## 2013-05-11 NOTE — Telephone Encounter (Signed)
Left message on patient's voicemail.Kendra Hall S  

## 2013-05-11 NOTE — Telephone Encounter (Signed)
TSH was normal. Remainder of labs normal except for elevated glucose of 144.

## 2013-05-11 NOTE — Telephone Encounter (Signed)
Continue stretching frequently. We can try an antispasmodic like flexeril if needed but this will make her drowsy.

## 2013-05-11 NOTE — Telephone Encounter (Signed)
spoke with patient and informed her of below. She states that now her arm has not gotten any better and wants to know what to do about that at this point

## 2013-05-24 ENCOUNTER — Other Ambulatory Visit: Payer: Self-pay | Admitting: Family Medicine

## 2013-05-24 MED ORDER — METFORMIN HCL ER 500 MG PO TB24: 500.0000 mg | ORAL_TABLET | Freq: Every day | ORAL | Status: DC

## 2013-05-24 MED ORDER — GLIMEPIRIDE 2 MG PO TABS: 2.0000 mg | ORAL_TABLET | Freq: Every day | ORAL | Status: DC

## 2013-07-28 ENCOUNTER — Ambulatory Visit (HOSPITAL_COMMUNITY): Payer: Medicare Other | Admitting: Licensed Clinical Social Worker

## 2013-10-15 ENCOUNTER — Ambulatory Visit (INDEPENDENT_AMBULATORY_CARE_PROVIDER_SITE_OTHER): Payer: Medicare Other | Admitting: Family Medicine

## 2013-10-15 ENCOUNTER — Encounter: Payer: Self-pay | Admitting: Family Medicine

## 2013-10-15 VITALS — BP 124/78 | HR 74 | Temp 98.4°F | Ht 63.0 in | Wt 204.2 lb

## 2013-10-15 DIAGNOSIS — I10 Essential (primary) hypertension: Secondary | ICD-10-CM | POA: Diagnosis not present

## 2013-10-15 DIAGNOSIS — E119 Type 2 diabetes mellitus without complications: Secondary | ICD-10-CM | POA: Diagnosis not present

## 2013-10-15 DIAGNOSIS — R252 Cramp and spasm: Secondary | ICD-10-CM

## 2013-10-15 LAB — POCT GLYCOSYLATED HEMOGLOBIN (HGB A1C): Hemoglobin A1C: 8.2

## 2013-10-15 MED ORDER — MAGNESIUM CHLORIDE ER 535 (64 MG) MG PO TBCR: EXTENDED_RELEASE_TABLET | ORAL | Status: DC

## 2013-10-15 MED ORDER — GLUCOSE BLOOD VI STRP: ORAL_STRIP | Status: DC

## 2013-10-15 MED ORDER — ONETOUCH LANCETS MISC: Status: DC

## 2013-10-15 MED ORDER — ONETOUCH ULTRA SYSTEM W/DEVICE KIT: 1.0000 | PACK | Freq: Once | Status: DC

## 2013-10-15 MED ORDER — CYCLOBENZAPRINE HCL 10 MG PO TABS: 10.0000 mg | ORAL_TABLET | Freq: Three times a day (TID) | ORAL | Status: DC | PRN

## 2013-10-15 MED ORDER — ALBUTEROL SULFATE HFA 108 (90 BASE) MCG/ACT IN AERS: 2.0000 | INHALATION_SPRAY | RESPIRATORY_TRACT | Status: DC | PRN

## 2013-10-15 MED ORDER — METFORMIN HCL ER 500 MG PO TB24: ORAL_TABLET | ORAL | Status: DC

## 2013-10-15 MED ORDER — GLIMEPIRIDE 2 MG PO TABS: 2.0000 mg | ORAL_TABLET | Freq: Every day | ORAL | Status: DC

## 2013-10-15 NOTE — Patient Instructions (Signed)
It was nice to see you today.  Regarding your spasms - Take the magnesium daily (2 tablets).  You can also use Flexeril as needed.  Diabetes - Please take the medications as prescribed.  Follow up in 3 months.

## 2013-10-15 NOTE — Assessment & Plan Note (Signed)
Normotensive today. Will continue to monitor closely.

## 2013-10-15 NOTE — Assessment & Plan Note (Signed)
Advised stretching. Prior labs did reveal Mag of 1.8.   Will start patient Magnesium chloride and Flexeril PRN.

## 2013-10-15 NOTE — Assessment & Plan Note (Signed)
Patient is noncompliant.  A1c 8.2 today. Had a long discussion with patient about adverse outcomes associated with uncontrolled diabetes. Restarting metformin and Amaryl today.  Rx also sent for glucometer.

## 2013-10-15 NOTE — Progress Notes (Signed)
   Subjective:    Patient ID: Kendra Hall, female    DOB: [birth year], 48 y.o.   MRN: [id number]  HPI 48 year old female with DM-2, HTN and hypothyroidism presents for follow up.  Concerns today:  1) Muscle cramping/tightening - Patient has been seen for this previously. - She continues to endorse frequent "muscle spasms" particularly in her lower back. - Muscle spasms are severe and resolve slowly after stretching. - Patient requesting medication for this today.  2) Diabetes mellitus, type 2 Disease Monitoring - patient does not monitor blood sugars at home. Medications: Patient is not taking any medications at this time.  Patient is noncompliant.  Medication Side Effects  Hypoglycemia: No ROS: + Vision changes; No polyuria or polydipsia  Review of Systems Per HPI    Objective:   Physical Exam Filed Vitals:   10/15/13 0908  BP: 124/78  Pulse: 74  Temp: 98.4 F (36.9 C)   Exam: General: well appearing female in NAD. Cardiovascular: RRR. No murmurs, rubs, or gallops. Respiratory: CTAB. No rales, rhonchi, or wheeze. MSK: Tight trapezius noted bilaterally. Extremities: No LE edema.     Assessment & Plan:  See Problem List

## 2013-11-26 ENCOUNTER — Encounter: Payer: Self-pay | Admitting: Family Medicine

## 2013-11-26 ENCOUNTER — Ambulatory Visit (INDEPENDENT_AMBULATORY_CARE_PROVIDER_SITE_OTHER): Payer: Medicare Other | Admitting: Family Medicine

## 2013-11-26 VITALS — BP 130/69 | HR 88 | Temp 98.7°F | Ht 68.0 in | Wt 198.0 lb

## 2013-11-26 DIAGNOSIS — K3189 Other diseases of stomach and duodenum: Secondary | ICD-10-CM

## 2013-11-26 DIAGNOSIS — R1013 Epigastric pain: Secondary | ICD-10-CM | POA: Diagnosis not present

## 2013-11-26 LAB — POCT H PYLORI SCREEN: H Pylori Screen, POC: POSITIVE

## 2013-11-26 MED ORDER — SUCRALFATE 1 G PO TABS: 1.0000 g | ORAL_TABLET | Freq: Three times a day (TID) | ORAL | Status: DC

## 2013-11-26 MED ORDER — OMEPRAZOLE 40 MG PO CPDR: 40.0000 mg | DELAYED_RELEASE_CAPSULE | Freq: Every day | ORAL | Status: DC

## 2013-11-26 NOTE — Patient Instructions (Addendum)
Peptic Ulcer A peptic ulcer is a sore in the lining of in your esophagus (esophageal ulcer), stomach (gastric ulcer), or in the first part of your small intestine (duodenal ulcer). The ulcer causes erosion into the deeper tissue. CAUSES  Normally, the lining of the stomach and the small intestine protects itself from the acid that digests food. The protective lining can be damaged by:  An infection caused by a bacterium called Helicobacter pylori (H. pylori).  Regular use of nonsteroidal anti-inflammatory drugs (NSAIDs), such as ibuprofen or aspirin.  Smoking tobacco. Other risk factors include being older than 19, drinking alcohol excessively, and having a family history of ulcer disease.  SYMPTOMS   Burning pain or gnawing in the area between the chest and the belly button.  Heartburn.  Nausea and vomiting.  Bloating. The pain can be worse on an empty stomach and at night. If the ulcer results in bleeding, it can cause:  Black, tarry stools.  Vomiting of bright red blood.  Vomiting of coffee ground looking materials. DIAGNOSIS  A diagnosis is usually made based upon your history and an exam. Other tests and procedures may be performed to find the cause of the ulcer. Finding a cause will help determine the best treatment. Tests and procedures may include:  Blood tests, stool tests, or breath tests to check for the bacterium H. pylori.  An upper gastrointestinal (GI) series of the esophagus, stomach, and small intestine.  An endoscopy to examine the esophagus, stomach, and small intestine.  A biopsy. TREATMENT  Treatment may include:  Eliminating the cause of the ulcer, such as smoking, NSAIDs, or alcohol.  Medicines to reduce the amount of acid in your digestive tract.  Antibiotic medicines if the ulcer is caused by the H. pylori bacterium.  An upper endoscopy to treat a bleeding ulcer.  Surgery if the bleeding is severe or if the ulcer created a hole somewhere in the  digestive system. HOME CARE INSTRUCTIONS   Avoid tobacco, alcohol, and caffeine. Smoking can increase the acid in the stomach, and continued smoking will impair the healing of ulcers.  Avoid foods and drinks that seem to cause discomfort or aggravate your ulcer.  Only take medicines as directed by your caregiver. Do not substitute over-the-counter medicines for prescription medicines without talking to your caregiver.  Keep any follow-up appointments and tests as directed. SEEK MEDICAL CARE IF:   Your do not improve within 7 days of starting treatment.  You have ongoing indigestion or heartburn. SEEK IMMEDIATE MEDICAL CARE IF:   You have sudden, sharp, or persistent abdominal pain.  You have bloody or dark black, tarry stools.  You vomit blood or vomit that looks like coffee grounds.  You become light headed, weak, or feel faint.  You become sweaty or clammy. MAKE SURE YOU:   Understand these instructions.  Will watch your condition.  Will get help right away if you are not doing well or get worse. Document Released: 06/07/2000 Document Revised: 03/04/2012 Document Reviewed: 01/08/2012 [facility] Patient Information 2014 [place].  Follow up with PCP in 4 weeks or sooner if needed.

## 2013-11-27 ENCOUNTER — Encounter: Payer: Self-pay | Admitting: Family Medicine

## 2013-11-27 DIAGNOSIS — R1013 Epigastric pain: Secondary | ICD-10-CM | POA: Insufficient documentation

## 2013-11-27 NOTE — Assessment & Plan Note (Addendum)
Patient was given PPI prescription as well as Carafate. Will place urgent GI referral. Concerned over weight loss and family history of cancer.  H.Pylori positive in office. Pt uncertain if prior positive.  Ordered UBT, will call pt Monday and discuss UBT and ABX F/U in 2 weeks or sooner if needed, unless able to be evaluated by GI prior.

## 2013-11-27 NOTE — Progress Notes (Signed)
   Subjective:    Patient ID: Kendra Hall, female    DOB: 06/28/1965, 48 y.o.   MRN: [id number]  HPI Kendra Hall is a 48 y.o. female presented today with complaints of abdominal pain and nausea.   Epigastric pain: Pt reports abdominal, pointing to epigastric, and nausea after every meal for about 1 week.  She has not been able to eat with these symptoms, but attempts about once a day to eat. She denies sick contacts, vomiting, fever, chills, changes in food or spicy food, no NSAID use or ETOH use. Patient denies dysphagia, chest pain, constipation, diarrhea or melena/hematochezia. She states she does have a history of stomach ulcers, but has been off daily medications for a long time. She endorses an unintentional  6-8 pound weight loss in this time period. She reports laying down and attempting to sleep helps a small amount. She has a family history of rectal cancer in mother (59's) and breast cancer in her sister. Pt reports prior EGD and colonoscopy screening "a few years ago", states she is probably due again. No records found on endoscopies.   Review of Systems Negative, with the exception of above mentioned in HPI     Objective:   Physical Exam BP 130/69  Pulse 88  Temp(Src) 98.7 F (37.1 C) (Oral)  Ht 5\' 8"  (1.727 m)  Wt 198 lb (89.812 kg)  BMI 30.11 kg/m2 Gen: pleasant. Lying on exam table holding abdomen. No acute distress. Nontoxic in appearance.  HEENT: AT. [place]. Bilateral eyes without injections or icterus. MMM. CV: RRR, no murmur appreciated.  Chest: CTAB, no wheeze or crackles Abd: Soft. overweight. TTP epigastric. ND.  BS present. No Masses palpated.

## 2013-11-29 ENCOUNTER — Other Ambulatory Visit: Payer: Medicare Other

## 2013-11-29 ENCOUNTER — Telehealth: Payer: Self-pay | Admitting: Family Medicine

## 2013-11-29 DIAGNOSIS — K3189 Other diseases of stomach and duodenum: Secondary | ICD-10-CM | POA: Diagnosis not present

## 2013-11-29 DIAGNOSIS — R1013 Epigastric pain: Secondary | ICD-10-CM

## 2013-11-29 NOTE — Progress Notes (Signed)
LABS DONE TODAY Aivy Akter 

## 2013-11-29 NOTE — Telephone Encounter (Signed)
Patient scheduled for labs appointment.Horris Latino will order test at time of visit.[place]

## 2013-11-29 NOTE — Telephone Encounter (Signed)
Please call Kendra Hall and inform her that I have placed another lab test for her to schedule at our lab asap. This is ordered because her H.Pylori screen is positive and I need to know if it is from present infection. If so I will need to treat her with abx for H.Pylori. In addition, with her acute weight loss over the last week, and her symptoms she needs an urgent GI referral and I placed that Friday. She will be hearing from them to schedule appt. I hope she is starting to feel better. Thanks.

## 2013-12-01 LAB — HELICOBACTER PYLORI ABS-IGG+IGA, BLD
H Pylori IgG: 1.93 {ISR} — ABNORMAL HIGH
HELICOBACTER PYLORI AB, IGA: 29.3 U/mL — ABNORMAL HIGH (ref <9.0)

## 2013-12-01 LAB — HELICOBACTER PYLORI  ANTIBODY, IGM: Helicobacter pylori, IgM: 23.7 U/mL — ABNORMAL HIGH (ref <9.0)

## 2013-12-02 ENCOUNTER — Telehealth: Payer: Self-pay | Admitting: Family Medicine

## 2013-12-02 MED ORDER — CLARITHROMYCIN 500 MG PO TABS: 500.0000 mg | ORAL_TABLET | Freq: Two times a day (BID) | ORAL | Status: DC

## 2013-12-02 MED ORDER — AMOXICILLIN 500 MG PO CAPS: 1000.0000 mg | ORAL_CAPSULE | Freq: Two times a day (BID) | ORAL | Status: DC

## 2013-12-02 NOTE — Telephone Encounter (Signed)
Kendra Hall is calling to ask for something for pain she's still having to her stomach.  Not been eating much.  Need med asap

## 2013-12-02 NOTE — Telephone Encounter (Signed)
Patient has H pylori.   She should continue taking Omeprazole as prescribed. I have also prescribed 2 antibiotics for her to take for 14 days.  Test of cure 4 weeks after completion of treatment.

## 2013-12-02 NOTE — Telephone Encounter (Signed)
Please advise.Thank you.Kendra Hall  

## 2013-12-07 ENCOUNTER — Ambulatory Visit (INDEPENDENT_AMBULATORY_CARE_PROVIDER_SITE_OTHER): Payer: Medicare Other | Admitting: Family Medicine

## 2013-12-07 ENCOUNTER — Encounter: Payer: Self-pay | Admitting: Family Medicine

## 2013-12-07 VITALS — BP 132/68 | HR 77 | Temp 98.7°F | Ht 63.0 in | Wt 196.0 lb

## 2013-12-07 DIAGNOSIS — R1013 Epigastric pain: Secondary | ICD-10-CM | POA: Diagnosis not present

## 2013-12-07 DIAGNOSIS — E119 Type 2 diabetes mellitus without complications: Secondary | ICD-10-CM | POA: Diagnosis not present

## 2013-12-07 MED ORDER — ESOMEPRAZOLE MAGNESIUM 40 MG PO CPDR: 40.0000 mg | DELAYED_RELEASE_CAPSULE | Freq: Every day | ORAL | Status: DC

## 2013-12-07 MED ORDER — METFORMIN HCL 1000 MG PO TABS: 1000.0000 mg | ORAL_TABLET | Freq: Every day | ORAL | Status: DC

## 2013-12-07 NOTE — Patient Instructions (Signed)
It was nice to see you today.  Continue taking the Biaxin, Amoxicillin, and Nexium. Our office will be in contact regarding your referral to GI.  Be sure to take the Amoxicillin (1000 mg twice daily; should be 2, twice daily).  Continue taking the metformin.  Also start taking the amaryl.  Follow up in ~ 1 month.

## 2013-12-07 NOTE — Progress Notes (Signed)
   Subjective:    Patient ID: Kendra Hall, female    DOB: February 12, 1966, 48 y.o.   MRN: [id number]  HPI 48 year old female presents for follow up.  1) Abdominal pain, epigastric - Patient recently seen on 6/5 by Dr. Raoul Pitch with complaints of epigastric abdominal pain x 1 week. - At that time, patient was evaluated and H. pylori antigen and stool studies were obtained.  Results came back positive.   - Patient was then started on triple therapy. - Today, patient reports that she has been feeling much better.  She does continue to have some mild to moderate epigastric discomfort.  Her PO intake has been improving, but she still has some associated nausea and pain. - No reported fever, chills, hematemesis, melena, hematochezia. - Patient reports some recent weight loss but her weight is stable from prior readings.  2) Diabetes mellitus, type 2 - Patient reports that she is now taking metformin 1000 mg daily - Patient still not taking Amaryl. - She's not checking her sugars. - No reported hypoglycemia or adverse effects from the medication.  She does note that if she titrates up greater than 1000 mg, she "does not feel well".  Review of Systems Per HPI    Objective:   Physical Exam Filed Vitals:   12/07/13 1419  BP: 132/68  Pulse: 77  Temp: 98.7 F (37.1 C)   Exam: General: well appearing female in NAD.  Cardiovascular: RRR. No murmurs, rubs, or gallops. Respiratory: CTAB. No rales, rhonchi, or wheeze. Abdomen: soft, tender to palpation in the epigastrium.  No organomegly.  No guarding or rebound.    Assessment & Plan:  See Problem List

## 2013-12-07 NOTE — Assessment & Plan Note (Signed)
Advised continued compliance with Metformin and Glipizide. Patient to follow up in ~ 1 month for repeat A1C.

## 2013-12-07 NOTE — Assessment & Plan Note (Signed)
H pylori positive with reported prior history of gastric/duodenal ulcer. Patient to continue triple therapy. Referral already in place for GI.

## 2013-12-13 ENCOUNTER — Telehealth: Payer: Self-pay | Admitting: Family Medicine

## 2013-12-13 NOTE — Telephone Encounter (Signed)
She needs to see GI. Referral was already placed.

## 2013-12-13 NOTE — Telephone Encounter (Signed)
Patient states she has been taking the antibiotic for recent dx of H. Pylori but she is still having abdominal pain. States her pain was a 10 now it is a 6. Please advise.

## 2014-01-04 ENCOUNTER — Encounter: Payer: Self-pay | Admitting: Family Medicine

## 2014-01-07 ENCOUNTER — Other Ambulatory Visit: Payer: Self-pay | Admitting: *Deleted

## 2014-01-07 MED ORDER — LEVOTHYROXINE SODIUM 175 MCG PO TABS: 175.0000 ug | ORAL_TABLET | Freq: Every day | ORAL | Status: DC

## 2014-01-11 DIAGNOSIS — E119 Type 2 diabetes mellitus without complications: Secondary | ICD-10-CM | POA: Diagnosis not present

## 2014-01-13 ENCOUNTER — Encounter: Payer: Self-pay | Admitting: Family Medicine

## 2014-01-13 ENCOUNTER — Other Ambulatory Visit (HOSPITAL_COMMUNITY)
Admission: RE | Admit: 2014-01-13 | Discharge: 2014-01-13 | Disposition: A | Discharge status: Home / Self Care | Payer: Medicare Other | Source: Ambulatory Visit | Attending: Family Medicine | Admitting: Family Medicine

## 2014-01-13 ENCOUNTER — Telehealth: Payer: Self-pay | Admitting: Family Medicine

## 2014-01-13 ENCOUNTER — Ambulatory Visit (INDEPENDENT_AMBULATORY_CARE_PROVIDER_SITE_OTHER): Payer: Medicare Other | Admitting: Family Medicine

## 2014-01-13 VITALS — BP 128/77 | HR 75 | Temp 98.8°F | Ht 63.0 in | Wt 199.9 lb

## 2014-01-13 DIAGNOSIS — N760 Acute vaginitis: Secondary | ICD-10-CM | POA: Diagnosis not present

## 2014-01-13 DIAGNOSIS — Z202 Contact with and (suspected) exposure to infections with a predominantly sexual mode of transmission: Secondary | ICD-10-CM

## 2014-01-13 DIAGNOSIS — M62838 Other muscle spasm: Secondary | ICD-10-CM

## 2014-01-13 DIAGNOSIS — Z124 Encounter for screening for malignant neoplasm of cervix: Secondary | ICD-10-CM | POA: Insufficient documentation

## 2014-01-13 DIAGNOSIS — N920 Excessive and frequent menstruation with regular cycle: Secondary | ICD-10-CM

## 2014-01-13 DIAGNOSIS — E119 Type 2 diabetes mellitus without complications: Secondary | ICD-10-CM

## 2014-01-13 DIAGNOSIS — Z113 Encounter for screening for infections with a predominantly sexual mode of transmission: Secondary | ICD-10-CM | POA: Diagnosis not present

## 2014-01-13 DIAGNOSIS — Z20828 Contact with and (suspected) exposure to other viral communicable diseases: Secondary | ICD-10-CM | POA: Diagnosis not present

## 2014-01-13 DIAGNOSIS — Z1151 Encounter for screening for human papillomavirus (HPV): Secondary | ICD-10-CM | POA: Insufficient documentation

## 2014-01-13 LAB — CBC WITH DIFFERENTIAL/PLATELET
Basophils Absolute: 0 10*3/uL (ref 0.0–0.1)
Basophils Relative: 0 % (ref 0–1)
Eosinophils Absolute: 0.1 10*3/uL (ref 0.0–0.7)
Eosinophils Relative: 2 % (ref 0–5)
HCT: 33.9 % — ABNORMAL LOW (ref 36.0–46.0)
Hemoglobin: 10.9 g/dL — ABNORMAL LOW (ref 12.0–15.0)
Lymphocytes Relative: 31 % (ref 12–46)
Lymphs Abs: 1.9 10*3/uL (ref 0.7–4.0)
MCH: 23.4 pg — ABNORMAL LOW (ref 26.0–34.0)
MCHC: 32.2 g/dL (ref 30.0–36.0)
MCV: 72.9 fL — ABNORMAL LOW (ref 78.0–100.0)
Monocytes Absolute: 0.6 10*3/uL (ref 0.1–1.0)
Monocytes Relative: 10 % (ref 3–12)
Neutro Abs: 3.5 10*3/uL (ref 1.7–7.7)
Neutrophils Relative %: 57 % (ref 43–77)
Platelets: 289 10*3/uL (ref 150–400)
RBC: 4.65 MIL/uL (ref 3.87–5.11)
RDW: 17.7 % — ABNORMAL HIGH (ref 11.5–15.5)
WBC: 6.1 10*3/uL (ref 4.0–10.5)

## 2014-01-13 LAB — POCT WET PREP (WET MOUNT): Clue Cells Wet Prep Whiff POC: NEGATIVE

## 2014-01-13 LAB — LIPID PANEL
Cholesterol: 136 mg/dL (ref 0–200)
HDL: 41 mg/dL (ref >39)
LDL Cholesterol: 72 mg/dL (ref 0–99)
Total CHOL/HDL Ratio: 3.3 Ratio
Triglycerides: 116 mg/dL (ref <150)
VLDL: 23 mg/dL (ref 0–40)

## 2014-01-13 LAB — BASIC METABOLIC PANEL
BUN: 16 mg/dL (ref 6–23)
CO2: 26 mEq/L (ref 19–32)
Calcium: 8.4 mg/dL (ref 8.4–10.5)
Chloride: 104 mEq/L (ref 96–112)
Creat: 1.02 mg/dL (ref 0.50–1.10)
Glucose, Bld: 160 mg/dL — ABNORMAL HIGH (ref 70–99)
Potassium: 4.3 mEq/L (ref 3.5–5.3)
Sodium: 138 mEq/L (ref 135–145)

## 2014-01-13 LAB — POCT GLYCOSYLATED HEMOGLOBIN (HGB A1C): Hemoglobin A1C: 7.4

## 2014-01-13 MED ORDER — CYCLOBENZAPRINE HCL 10 MG PO TABS: 10.0000 mg | ORAL_TABLET | Freq: Three times a day (TID) | ORAL | Status: DC | PRN

## 2014-01-13 NOTE — Patient Instructions (Signed)
Pap Test A Pap test is a procedure done in a clinic office to evaluate cells that are on the surface of the cervix. The cervix is the lower portion of the uterus and upper portion of the vagina. For some women, the cervical region has the potential to form cancer. With consistent evaluations by your caregiver, this type of cancer can be prevented.  If a Pap test is abnormal, it is most often a result of a previous exposure to human papillomavirus (HPV). HPV is a virus that can infect the cells of the cervix and cause dysplasia. Dysplasia is where the cells no longer look normal. If a woman has been diagnosed with high-grade or severe dysplasia, they are at higher risk of developing cervical cancer. People diagnosed with low-grade dysplasia should still be seen by their caregiver because there is a small chance that low-grade dysplasia could develop into cancer.  LET YOUR CAREGIVER KNOW ABOUT:  Recent sexually transmitted infection (STI) you have had.  Any new sex partners you have had.  History of previous abnormal Pap tests results.  History of previous cervical procedures you have had (colposcopy, biopsy, loop electrosurgical excision procedure [LEEP]).  Concerns you have had regarding unusual vaginal discharge.  History of pelvic pain.  Your use of birth control. BEFORE THE PROCEDURE  Ask your caregiver when to schedule your Pap test. It is best not to be on your period if your caregiver uses a wooden spatula to collect cells or applies cells to a glass slide. Newer techniques are not so sensitive to the timing of a menstrual cycle.  Do not douche or have sexual intercourse for 24 hours before the test.   Do not use vaginal creams or tampons for 24 hours before the test.   Empty your bladder just before the test to lessen any discomfort.  PROCEDURE You will lie on an exam table with your feet in stirrups. A warm metal or plastic instrument (speculum) is placed in your vagina. This  instrument allows your caregiver to see the inside of your vagina and look at your cervix. A small, plastic brush or wooden spatula is then used to collect cervical cells. These cells are placed in a lab specimen container. The cells are looked at under a microscope. A specialist will determine if the cells are normal.  AFTER THE PROCEDURE Make sure to get your test results.If your results come back abnormal, you may need further testing.  Document Released: 08/31/2002 Document Revised: 09/02/2011 Document Reviewed: 06/06/2011 [facility] Patient Information 2015 [place], [place]. This information is not intended to replace advice given to you by your health care provider. Make sure you discuss any questions you have with your health care provider.  It was a pleasure see you today Continue to take ALL your diabetes medication as prescribed. Follow up in 3 months.  We will call you with your lab results when they all become available.

## 2014-01-13 NOTE — Telephone Encounter (Signed)
Spoke with patietn and informed

## 2014-01-13 NOTE — Telephone Encounter (Signed)
Please call pt and inform her that her wet prep was negative for yeast or bacteria infection. Thanks.

## 2014-01-14 LAB — CYTOLOGY - PAP

## 2014-01-14 LAB — HIV ANTIBODY (ROUTINE TESTING W REFLEX): HIV 1&2 Ab, 4th Generation: NONREACTIVE

## 2014-01-16 ENCOUNTER — Telehealth: Payer: Self-pay | Admitting: Family Medicine

## 2014-01-16 NOTE — Assessment & Plan Note (Signed)
A1c improved today (7.4 from 8.2). Pt admits to noncompliance with glipizide  - Continue Metformin and Glipizide as prescribed.  - f/u 3 months with PCP

## 2014-01-16 NOTE — Assessment & Plan Note (Signed)
PAP, C/G and HIV completed today. Will call pt with results once they become available.

## 2014-01-16 NOTE — Assessment & Plan Note (Signed)
-   CBC ordered, concerns for anemia - GYN referral placed. - Will call pt with results and recommended Iron supplement in she is anemic.

## 2014-01-16 NOTE — Telephone Encounter (Addendum)
Please call Kendra Hall and inform her all her lab work was good.  - I want her to schedule a mammogram ASAP, she will need this information.  - Her PAP was normal, without any signs of infections including HIV.  - Her cholesterol was good.  - Her sugar was mildly elevated, please remind her to take her medications as directed.  - Lastly she is still mildly anemic, likely due to her heavy menstrual cycles. She should take an iron supplement daily if she has not started one already. A referral has been placed for her for gynecology. Thanks.

## 2014-01-16 NOTE — Progress Notes (Signed)
   Subjective:    Patient ID: Kendra Hall, female    DOB: Mar 09, 1966, 48 y.o.   MRN: [id number]  HPI Kendra Hall is a 48 y.o. female presented to [facility] for cervical cancer screening  Well women Exam: Patient states her LMP was 12/17/2013. Her cycles are every 30 days and last 7 days. She states she "bleeds like a stuck pig" on most of her cycles. She states she has a know history of fibroids (was not mentioned on 2013 pelvis US). She has been to seen gynecology once in the past and they discussed mostly medical management. She states she is not interested in pills or and IUD and would like a hysterectomy or some other option to decrease her bleeding pattern. She does not desire additional children. Her sister had breast cancer <40. Her last mammogram recorded was in 2012, which was normal with a small cyst, dense fibroglandular pattern. Patient recommended to follow up yearly d/t family history.   Diabetes: Pt states she has not been taking her Glipizide as prescribed. She takes it sometimes, but not every day. She denies any hypo/hyperglycemic events. She does not check her sugars regularly.   Review of Systems Per hpi    Objective:   Physical Exam BP 128/77  Pulse 75  Temp(Src) 98.8 F (37.1 C) (Oral)  Ht 5\' 3"  (1.6 m)  Wt 199 lb 14.4 oz (90.674 kg)  BMI 35.42 kg/m2 Gen: NAD. Non-toxic in appearance AAF. Well developed, well nourished, obese.  CV: RRR  Chest: CTAB, no wheeze or crackles Abd: Soft. Obese. NTND. BS Present. No Masses palpated.  GYN:  External genitalia within normal limits.  Vaginal mucosa pink, moist, normal rugae.  Nonfriable cervix without lesions, mild white discharge, no bleeding noted on speculum exam.  No cervical motion tenderness. No adnexal masses bilaterally.            Assessment & Plan:

## 2014-01-16 NOTE — Assessment & Plan Note (Signed)
Flexeril refilled today

## 2014-01-16 NOTE — Assessment & Plan Note (Signed)
Patient requested additional testing, HIV ordered today

## 2014-01-16 NOTE — Assessment & Plan Note (Signed)
G/C culture today with wet prep Will call pt with results.

## 2014-01-17 NOTE — Telephone Encounter (Signed)
Spoke with patient and informed her of below. She will get an OTC iron supplement

## 2014-02-02 ENCOUNTER — Ambulatory Visit (INDEPENDENT_AMBULATORY_CARE_PROVIDER_SITE_OTHER): Payer: Medicare Other | Admitting: Family Medicine

## 2014-02-02 VITALS — BP 127/83 | HR 72 | Temp 98.4°F | Wt 201.0 lb

## 2014-02-02 DIAGNOSIS — L918 Other hypertrophic disorders of the skin: Secondary | ICD-10-CM | POA: Insufficient documentation

## 2014-02-02 DIAGNOSIS — L919 Hypertrophic disorder of the skin, unspecified: Secondary | ICD-10-CM

## 2014-02-02 DIAGNOSIS — L909 Atrophic disorder of skin, unspecified: Secondary | ICD-10-CM | POA: Diagnosis not present

## 2014-02-02 NOTE — Progress Notes (Signed)
   Subjective:    Patient ID: Kendra Hall, female    DOB: 12/25/1965, 48 y.o.   MRN: [id number]  HPI Pt presents for evaluation of several skin lesions. She reports they have been presents for years. The lesions below her eye grow slowly and sometimes fall off and grow back. The skin tag in her armpit has been growing slowly and is occasionally itchy.    Review of Systems No bleeding or pain in either.    Objective:   Physical Exam  Nursing note and vitals reviewed. Constitutional: She is oriented to person, place, and time. She appears well-developed and well-nourished. No distress.  HENT:  Head: Normocephalic and atraumatic.  Eyes: Conjunctivae are normal. Right eye exhibits no discharge. Left eye exhibits no discharge. No scleral icterus.  Cardiovascular: Normal rate.   Pulmonary/Chest: Effort normal.  Neurological: She is alert and oriented to person, place, and time.  Skin: Skin is warm, dry and intact. Lesion noted. No rash noted. She is not diaphoretic. No pallor.     Psychiatric: She has a normal mood and affect. Her behavior is normal.          Assessment & Plan:

## 2014-02-02 NOTE — Patient Instructions (Signed)
Your skin tags and moles are not dangerous. If you ever decide that you want [place] to take them off then we would be happy to do that but it is not necessary.  For your son, I would recommend calling several dermatologists in the area and asking if they do any charity care or reduced rates for self pay patients and they may be able to help you out. If you've tried steroid cream already, it might be worth getting some athlete's foot cream (ketoconazole) and trying that, although it can take a long time to work.

## 2014-02-02 NOTE — Assessment & Plan Note (Addendum)
Axillary and facial, occasionally irritated and itchy - does not want them off today, will rtc if she changes her mind

## 2014-02-14 ENCOUNTER — Telehealth: Payer: Self-pay | Admitting: Family Medicine

## 2014-02-14 ENCOUNTER — Encounter: Payer: Self-pay | Admitting: Family Medicine

## 2014-02-14 ENCOUNTER — Ambulatory Visit (INDEPENDENT_AMBULATORY_CARE_PROVIDER_SITE_OTHER): Payer: Medicare Other | Admitting: Family Medicine

## 2014-02-14 ENCOUNTER — Ambulatory Visit
Admission: RE | Admit: 2014-02-14 | Discharge: 2014-02-14 | Disposition: A | Discharge status: Home / Self Care | Payer: Medicare Other | Source: Ambulatory Visit | Attending: Family Medicine | Admitting: Family Medicine

## 2014-02-14 VITALS — BP 141/75 | HR 75 | Temp 99.1°F | Wt 201.7 lb

## 2014-02-14 DIAGNOSIS — M79609 Pain in unspecified limb: Secondary | ICD-10-CM | POA: Diagnosis not present

## 2014-02-14 DIAGNOSIS — M79671 Pain in right foot: Secondary | ICD-10-CM

## 2014-02-14 DIAGNOSIS — S99919A Unspecified injury of unspecified ankle, initial encounter: Secondary | ICD-10-CM | POA: Diagnosis not present

## 2014-02-14 DIAGNOSIS — S8990XA Unspecified injury of unspecified lower leg, initial encounter: Secondary | ICD-10-CM | POA: Diagnosis not present

## 2014-02-14 NOTE — Assessment & Plan Note (Signed)
Obtain A/P, lateral, oblique of the foot to r/o fx.  Otherwise recommend conservative measures, tennis shoes for walking and f/u PRN.  Tylenol 650 mg q 6 hrs for pain.

## 2014-02-14 NOTE — Progress Notes (Signed)
Kendra Hall is a 48 y.o. female who presents today for R foot pain.  Pt states her R foot pain has been ongoing now for three days.  She was initially walking and hit the medial side of her R foot on a suitcase, causing immediate pain.  She was able to continue walking and denied any edema at that time.  However, she awoke the following morning with pain at the medial 1st digit that eventually spread to the plantar aspect of the 2nd/3rd MT.  She has had trouble walking due to this and has noticed increased pressure/edema in that area.  Denies any previous injury to this area, is R foot/handed dominant, and denies any paresthesias or weakness in the area.   Past Medical History  Diagnosis Date  . Allergy   . Depression   . Diabetes mellitus   . Osteoarthritis     Facet hypertrophy with injections  . Multiple thyroid nodules   . Gastric ulcer   . Migraine   . Grave's disease     History  Smoking status  . Current Every Day Smoker -- 0.30 packs/day  . Types: Cigarettes  Smokeless tobacco  . Not on file    Family History  Problem Relation Age of Onset  . Hypertension Mother   . Diabetes Mother   . Cancer Mother     rectal  . Heart disease Father 49    MI x5  . Hypertension Father   . Diabetes Father   . Cancer Sister 54    breast    Current Outpatient Prescriptions on File Prior to Visit  Medication Sig Dispense Refill  . albuterol (PROAIR HFA) 108 (90 BASE) MCG/ACT inhaler Inhale 2 puffs into the lungs every 4 (four) hours as needed for wheezing.  1 Inhaler  5  . Blood Glucose Monitoring Suppl (ONE TOUCH ULTRA SYSTEM KIT) W/DEVICE KIT 1 kit by Does not apply route once.  1 each  0  . cyclobenzaprine (FLEXERIL) 10 MG tablet Take 1 tablet (10 mg total) by mouth 3 (three) times daily as needed for muscle spasms.  30 tablet  3  . esomeprazole (NEXIUM) 40 MG capsule Take 1 capsule (40 mg total) by mouth daily at 12 noon.  90 capsule  3  . glimepiride (AMARYL) 2 MG tablet Take  1 tablet (2 mg total) by mouth daily with breakfast.  90 tablet  3  . glucose blood (ONE TOUCH ULTRA TEST) test strip Use as instructed  100 each  12  . ibuprofen (ADVIL,MOTRIN) 600 MG tablet Take 1 tablet (600 mg total) by mouth every 8 (eight) hours as needed for pain.  30 tablet  0  . levothyroxine (SYNTHROID, LEVOTHROID) 175 MCG tablet Take 1 tablet (175 mcg total) by mouth daily.  30 tablet  1  . metFORMIN (GLUCOPHAGE) 1000 MG tablet Take 1 tablet (1,000 mg total) by mouth daily.  90 tablet  3  . naproxen (NAPROSYN) 500 MG tablet Take 1 tablet (500 mg total) by mouth 2 (two) times daily with a meal. As needed.  30 tablet  0  . ONE TOUCH LANCETS MISC Use up to 6 times daily to test blood sugar.  200 each  3  . rizatriptan (MAXALT-MLT) 10 MG disintegrating tablet Take 1 tablet (10 mg total) by mouth as needed for migraine. May repeat in 2 hours if needed  15 tablet  1  . sucralfate (CARAFATE) 1 G tablet Take 1 tablet (1 g total) by mouth  4 (four) times daily -  with meals and at bedtime.  90 tablet  1   No current facility-administered medications on file prior to visit.    ROS: Per HPI.  All other systems reviewed and are negative.   Physical Exam Filed Vitals:   02/14/14 1426  BP: 141/75  Pulse: 75  Temp: 99.1 F (37.3 C)    Physical Examination: R Foot - Normal inspection, TTP at the plantar 2nd/3rd MT heads, no other TTP, no edema.  ROM normal and MS 5/5 B/L.  NV intact distally.

## 2014-02-14 NOTE — Patient Instructions (Signed)
Please have the x-rays done of your foot.  As well, you can take tylenol 650 mg three to four times per day.  As well, please use ice to the area for 15-20 minutes, three to four times per day.  Wear tennis shoes if you are going to walk and refrain from tight fitting shoes.   Thanks, Dr. Awanda Mink

## 2014-02-14 NOTE — Telephone Encounter (Signed)
Discussed X-ray results with pt, no questions.  Recommend she continue with conservative measures.  Tamela Oddi Awanda Mink, DO of [facility] 02/14/2014, 8:22 PM

## 2014-02-16 DIAGNOSIS — Z800 Family history of malignant neoplasm of digestive organs: Secondary | ICD-10-CM | POA: Diagnosis not present

## 2014-02-16 DIAGNOSIS — Z86010 Personal history of colonic polyps: Secondary | ICD-10-CM | POA: Diagnosis not present

## 2014-02-16 DIAGNOSIS — R1013 Epigastric pain: Secondary | ICD-10-CM | POA: Diagnosis not present

## 2014-02-16 DIAGNOSIS — R195 Other fecal abnormalities: Secondary | ICD-10-CM | POA: Diagnosis not present

## 2014-03-01 DIAGNOSIS — D126 Benign neoplasm of colon, unspecified: Secondary | ICD-10-CM | POA: Diagnosis not present

## 2014-03-01 DIAGNOSIS — R1013 Epigastric pain: Secondary | ICD-10-CM | POA: Diagnosis not present

## 2014-03-01 DIAGNOSIS — Z86010 Personal history of colonic polyps: Secondary | ICD-10-CM | POA: Diagnosis not present

## 2014-03-01 DIAGNOSIS — D128 Benign neoplasm of rectum: Secondary | ICD-10-CM | POA: Diagnosis not present

## 2014-03-01 DIAGNOSIS — Z1211 Encounter for screening for malignant neoplasm of colon: Secondary | ICD-10-CM | POA: Diagnosis not present

## 2014-03-01 DIAGNOSIS — D129 Benign neoplasm of anus and anal canal: Secondary | ICD-10-CM | POA: Diagnosis not present

## 2014-03-25 ENCOUNTER — Other Ambulatory Visit: Payer: Self-pay | Admitting: *Deleted

## 2014-03-26 ENCOUNTER — Other Ambulatory Visit: Payer: Self-pay | Admitting: Family Medicine

## 2014-03-27 MED ORDER — LEVOTHYROXINE SODIUM 175 MCG PO TABS: 175.0000 ug | ORAL_TABLET | Freq: Every day | ORAL | Status: DC

## 2014-04-17 ENCOUNTER — Emergency Department (INDEPENDENT_AMBULATORY_CARE_PROVIDER_SITE_OTHER)
Admission: EM | Admit: 2014-04-17 | Discharge: 2014-04-17 | Disposition: A | Discharge status: Home / Self Care | Payer: Medicare Other | Source: Home / Self Care | Attending: Emergency Medicine | Admitting: Emergency Medicine

## 2014-04-17 ENCOUNTER — Encounter (HOSPITAL_COMMUNITY): Payer: Self-pay | Admitting: Emergency Medicine

## 2014-04-17 DIAGNOSIS — J209 Acute bronchitis, unspecified: Secondary | ICD-10-CM | POA: Diagnosis not present

## 2014-04-17 MED ORDER — PREDNISONE 20 MG PO TABS: 20.0000 mg | ORAL_TABLET | Freq: Two times a day (BID) | ORAL | Status: DC

## 2014-04-17 MED ORDER — METHYLPREDNISOLONE ACETATE 80 MG/ML IJ SUSP
80.0000 mg | Freq: Once | INTRAMUSCULAR | Status: AC
  Administered 2014-04-17: 80 mg via INTRAMUSCULAR

## 2014-04-17 MED ORDER — METHYLPREDNISOLONE ACETATE 80 MG/ML IJ SUSP
INTRAMUSCULAR | Status: AC
  Filled 2014-04-17: qty 1

## 2014-04-17 MED ORDER — TRAMADOL HCL 50 MG PO TABS: ORAL_TABLET | ORAL | Status: DC

## 2014-04-17 MED ORDER — BECLOMETHASONE DIPROPIONATE 80 MCG/ACT IN AERS: 2.0000 | INHALATION_SPRAY | Freq: Two times a day (BID) | RESPIRATORY_TRACT | Status: DC

## 2014-04-17 NOTE — ED Provider Notes (Signed)
  Chief Complaint   Cough   History of Present Illness   Kendra Hall is a 48 year old female who has had a three-week history of dry cough. Her chest hurts when she coughs. She denies any shortness of breath or wheezing. She has a long-standing history of asthma which is well controlled on as needed use of Provera. She notes aching in her shoulders, sinus pain, postnasal drip, headache, disequilibrium, subjective fever and chills at onset of symptoms, sore throat, and nausea.  Review of Systems   Other than as noted above, the patient denies any of the following symptoms: Systemic:  No fevers, chills, sweats, or weight loss. ENT:  No nasal congestion, sneezing, itching, postnasal drip, sinus pressure, headache, sore throat, or hoarseness. Lungs:  No wheezing, shortness of breath, chest tightness or congestion. Heart:  No chest pain, tightness, pressure, PND, orthopnea, or ankle edema. GI:  No indigestion, heartburn, waterbrash, burping, abdominal pain, nausea, or vomiting.  [place]   Past medical history, family history, social history, meds, and allergies were reviewed.   Physical Examination     Vital signs:  BP 139/63  Pulse 78  Temp(Src) 98.4 F (36.9 C) (Oral)  Resp 16  SpO2 100%  LMP 03/29/2014 General:  Alert and oriented.  In no distress.  Skin warm and dry. ENT: TMs and ear canals normal.  Nasal mucosa normal, without drainage.  Pharynx clear without exudate or drainage.  No intraoral lesions. Neck:  No adenopathy, tenderness or mass.  No JVD. Lungs:  No respiratory distress.  Breath sounds clear and equal bilaterally.  No wheezes, rales or rhonchi. Heart:  Regular rhythm, no gallops or murmers.  No pedal edema. Abdomon:  Soft and nontender.  No organomegaly or mass.  Course in [facility] [place]   The following medications were given:  Medications  methylPREDNISolone acetate (DEPO-MEDROL) injection 80 mg (80 mg Intramuscular Given 04/17/14 1834)   Assessment    The encounter diagnosis was Acute bronchitis, unspecified organism.  Plan     1.  Meds:  The following meds were prescribed:   Discharge Medication List as of 04/17/2014  6:15 PM    START taking these medications   Details  beclomethasone (QVAR) 80 MCG/ACT inhaler Inhale 2 puffs into the lungs 2 (two) times daily., Starting 04/17/2014, Until Discontinued, Normal    predniSONE (DELTASONE) 20 MG tablet Take 1 tablet (20 mg total) by mouth 2 (two) times daily., Starting 04/17/2014, Until Discontinued, Normal    traMADol (ULTRAM) 50 MG tablet 1 to 2 tablets every 8 hours as needed for cough, Print        2.  Patient Education/Counseling:  The patient was given appropriate handouts, self care instructions, and instructed in symptomatic relief.    3.  Follow up:  The patient was told to follow up here if no better in 3 to 4 days, or sooner if becoming worse in any way, and given some red flag symptoms such as difficulty breathing or chest pain which would prompt immediate return.         Harden Mo, MD 04/17/14 [phone]

## 2014-04-17 NOTE — ED Notes (Signed)
C/o cough, body aches, sinus pressure, and headache x 3 weeks. diarrhea x 2 yesterday. C/o dizziness and almost fell twice last week.  Had a fever 1 week ago but did not check it.

## 2014-04-17 NOTE — Discharge Instructions (Signed)

## 2014-05-16 ENCOUNTER — Telehealth: Payer: Self-pay | Admitting: Family Medicine

## 2014-05-16 NOTE — Telephone Encounter (Signed)
Pt is asking to speak with RN, has a questions about her diabetes, wants to know what the symptoms are if her sugar is elevated?

## 2014-05-16 NOTE — Telephone Encounter (Signed)
Patient has a boil in groin area and wants to know if it is related to her diabetes.  Has not been checking her CBGs.  On oral hypoglycemics.  Last A1c was in July--7.4.  Also, has Graves disease and has been feeling "yucky and tired."

## 2014-05-23 ENCOUNTER — Other Ambulatory Visit: Payer: Self-pay | Admitting: *Deleted

## 2014-05-24 MED ORDER — LEVOTHYROXINE SODIUM 175 MCG PO TABS: 175.0000 ug | ORAL_TABLET | Freq: Every day | ORAL | Status: DC

## 2014-07-25 ENCOUNTER — Emergency Department (INDEPENDENT_AMBULATORY_CARE_PROVIDER_SITE_OTHER)
Admission: EM | Admit: 2014-07-25 | Discharge: 2014-07-25 | Disposition: A | Discharge status: Other Acute Inpatient Hospital | Payer: 59 | Source: Home / Self Care

## 2014-07-25 ENCOUNTER — Encounter (HOSPITAL_COMMUNITY): Payer: Self-pay | Admitting: Neurology

## 2014-07-25 ENCOUNTER — Emergency Department (HOSPITAL_COMMUNITY): Payer: 59

## 2014-07-25 ENCOUNTER — Emergency Department (HOSPITAL_COMMUNITY)
Admission: EM | Admit: 2014-07-25 | Discharge: 2014-07-26 | Disposition: A | Discharge status: Home / Self Care | Payer: 59 | Attending: Emergency Medicine | Admitting: Emergency Medicine

## 2014-07-25 ENCOUNTER — Encounter (HOSPITAL_COMMUNITY): Payer: Self-pay

## 2014-07-25 DIAGNOSIS — R0789 Other chest pain: Secondary | ICD-10-CM

## 2014-07-25 DIAGNOSIS — R2241 Localized swelling, mass and lump, right lower limb: Secondary | ICD-10-CM | POA: Diagnosis not present

## 2014-07-25 DIAGNOSIS — Z8659 Personal history of other mental and behavioral disorders: Secondary | ICD-10-CM | POA: Insufficient documentation

## 2014-07-25 DIAGNOSIS — G43909 Migraine, unspecified, not intractable, without status migrainosus: Secondary | ICD-10-CM | POA: Diagnosis not present

## 2014-07-25 DIAGNOSIS — R079 Chest pain, unspecified: Secondary | ICD-10-CM | POA: Diagnosis present

## 2014-07-25 DIAGNOSIS — Z720 Tobacco use: Secondary | ICD-10-CM | POA: Diagnosis not present

## 2014-07-25 DIAGNOSIS — R0602 Shortness of breath: Secondary | ICD-10-CM | POA: Diagnosis not present

## 2014-07-25 DIAGNOSIS — J9811 Atelectasis: Secondary | ICD-10-CM | POA: Diagnosis not present

## 2014-07-25 DIAGNOSIS — K2100 Gastro-esophageal reflux disease with esophagitis, without bleeding: Secondary | ICD-10-CM

## 2014-07-25 DIAGNOSIS — Z8739 Personal history of other diseases of the musculoskeletal system and connective tissue: Secondary | ICD-10-CM | POA: Insufficient documentation

## 2014-07-25 DIAGNOSIS — R072 Precordial pain: Secondary | ICD-10-CM | POA: Diagnosis not present

## 2014-07-25 DIAGNOSIS — E042 Nontoxic multinodular goiter: Secondary | ICD-10-CM | POA: Diagnosis not present

## 2014-07-25 DIAGNOSIS — Z79899 Other long term (current) drug therapy: Secondary | ICD-10-CM | POA: Insufficient documentation

## 2014-07-25 DIAGNOSIS — M7989 Other specified soft tissue disorders: Secondary | ICD-10-CM

## 2014-07-25 DIAGNOSIS — Z7951 Long term (current) use of inhaled steroids: Secondary | ICD-10-CM | POA: Insufficient documentation

## 2014-07-25 DIAGNOSIS — K219 Gastro-esophageal reflux disease without esophagitis: Secondary | ICD-10-CM | POA: Insufficient documentation

## 2014-07-25 DIAGNOSIS — E119 Type 2 diabetes mellitus without complications: Secondary | ICD-10-CM | POA: Diagnosis not present

## 2014-07-25 LAB — BASIC METABOLIC PANEL
Anion gap: 6 (ref 5–15)
BUN: 12 mg/dL (ref 6–23)
CO2: 27 mmol/L (ref 19–32)
Calcium: 8.8 mg/dL (ref 8.4–10.5)
Chloride: 106 mmol/L (ref 96–112)
Creatinine, Ser: 1.04 mg/dL (ref 0.50–1.10)
GFR calc Af Amer: 72 mL/min — ABNORMAL LOW (ref >90)
GFR calc non Af Amer: 62 mL/min — ABNORMAL LOW (ref >90)
Glucose, Bld: 89 mg/dL (ref 70–99)
Potassium: 3.7 mmol/L (ref 3.5–5.1)
Sodium: 139 mmol/L (ref 135–145)

## 2014-07-25 LAB — CBC
HCT: 32.3 % — ABNORMAL LOW (ref 36.0–46.0)
Hemoglobin: 10.2 g/dL — ABNORMAL LOW (ref 12.0–15.0)
MCH: 23.6 pg — ABNORMAL LOW (ref 26.0–34.0)
MCHC: 31.6 g/dL (ref 30.0–36.0)
MCV: 74.8 fL — ABNORMAL LOW (ref 78.0–100.0)
Platelets: 318 10*3/uL (ref 150–400)
RBC: 4.32 MIL/uL (ref 3.87–5.11)
RDW: 16.4 % — ABNORMAL HIGH (ref 11.5–15.5)
WBC: 5.2 10*3/uL (ref 4.0–10.5)

## 2014-07-25 LAB — I-STAT TROPONIN, ED
Troponin i, poc: 0 ng/mL (ref 0.00–0.08)
Troponin i, poc: 0 ng/mL (ref 0.00–0.08)

## 2014-07-25 MED ORDER — ENOXAPARIN SODIUM 100 MG/ML ~~LOC~~ SOLN
90.0000 mg | Freq: Once | SUBCUTANEOUS | Status: AC
  Administered 2014-07-25: 90 mg via SUBCUTANEOUS
  Filled 2014-07-25: qty 1

## 2014-07-25 MED ORDER — IOHEXOL 350 MG/ML SOLN
100.0000 mL | Freq: Once | INTRAVENOUS | Status: AC | PRN
  Administered 2014-07-25: 100 mL via INTRAVENOUS

## 2014-07-25 MED ORDER — ESOMEPRAZOLE MAGNESIUM 40 MG PO CPDR: 40.0000 mg | DELAYED_RELEASE_CAPSULE | Freq: Every day | ORAL | Status: DC

## 2014-07-25 MED ORDER — HYDROCODONE-ACETAMINOPHEN 5-325 MG PO TABS: 1.0000 | ORAL_TABLET | Freq: Four times a day (QID) | ORAL | Status: DC | PRN

## 2014-07-25 MED ORDER — OXYCODONE-ACETAMINOPHEN 5-325 MG PO TABS
1.0000 | ORAL_TABLET | Freq: Once | ORAL | Status: AC
Start: 2014-07-25 — End: 2014-07-25
  Administered 2014-07-25: 1 via ORAL
  Filled 2014-07-25: qty 1

## 2014-07-25 MED ORDER — MORPHINE SULFATE 4 MG/ML IJ SOLN
4.0000 mg | Freq: Once | INTRAMUSCULAR | Status: AC
  Administered 2014-07-25: 4 mg via INTRAVENOUS
  Filled 2014-07-25: qty 1

## 2014-07-25 NOTE — Discharge Instructions (Signed)
Chest Pain (Nonspecific) °It is often hard to give a specific diagnosis for the cause of chest pain. There is always a chance that your pain could be related to something serious, such as a heart attack or a blood clot in the lungs. You need to follow up with your health care provider for further evaluation. °CAUSES  °· Heartburn. °· Pneumonia or bronchitis. °· Anxiety or stress. °· Inflammation around your heart (pericarditis) or lung (pleuritis or pleurisy). °· A blood clot in the lung. °· A collapsed lung (pneumothorax). It can develop suddenly on its own (spontaneous pneumothorax) or from trauma to the chest. °· Shingles infection (herpes zoster virus). °The chest wall is composed of bones, muscles, and cartilage. Any of these can be the source of the pain. °· The bones can be bruised by injury. °· The muscles or cartilage can be strained by coughing or overwork. °· The cartilage can be affected by inflammation and become sore (costochondritis). °DIAGNOSIS  °Lab tests or other studies may be needed to find the cause of your pain. Your health care provider may have you take a test called an ambulatory electrocardiogram (ECG). An ECG records your heartbeat patterns over a 24-hour period. You may also have other tests, such as: °· Transthoracic echocardiogram (TTE). During echocardiography, sound waves are used to evaluate how blood flows through your heart. °· Transesophageal echocardiogram (TEE). °· Cardiac monitoring. This allows your health care provider to monitor your heart rate and rhythm in real time. °· Holter monitor. This is a portable device that records your heartbeat and can help diagnose heart arrhythmias. It allows your health care provider to track your heart activity for several days, if needed. °· Stress tests by exercise or by giving medicine that makes the heart beat faster. °TREATMENT  °· Treatment depends on what may be causing your chest pain. Treatment may include: °· Acid blockers for  heartburn. °· Anti-inflammatory medicine. °· Pain medicine for inflammatory conditions. °· Antibiotics if an infection is present. °· You may be advised to change lifestyle habits. This includes stopping smoking and avoiding alcohol, caffeine, and chocolate. °· You may be advised to keep your head raised (elevated) when sleeping. This reduces the chance of acid going backward from your stomach into your esophagus. °Most of the time, nonspecific chest pain will improve within 2-3 days with rest and mild pain medicine.  °HOME CARE INSTRUCTIONS  °· If antibiotics were prescribed, take them as directed. Finish them even if you start to feel better. °· For the next few days, avoid physical activities that bring on chest pain. Continue physical activities as directed. °· Do not use any tobacco products, including cigarettes, chewing tobacco, or electronic cigarettes. °· Avoid drinking alcohol. °· Only take medicine as directed by your health care provider. °· Follow your health care provider's suggestions for further testing if your chest pain does not go away. °· Keep any follow-up appointments you made. If you do not go to an appointment, you could develop lasting (chronic) problems with pain. If there is any problem keeping an appointment, call to reschedule. °SEEK MEDICAL CARE IF:  °· Your chest pain does not go away, even after treatment. °· You have a rash with blisters on your chest. °· You have a fever. °SEEK IMMEDIATE MEDICAL CARE IF:  °· You have increased chest pain or pain that spreads to your arm, neck, jaw, back, or abdomen. °· You have shortness of breath. °· You have an increasing cough, or you cough   up blood.  You have severe back or abdominal pain.  You feel nauseous or vomit.  You have severe weakness.  You faint.  You have chills. This is an emergency. Do not wait to see if the pain will go away. Get medical help at once. Call your local emergency services (911 in [place] Do not drive  yourself to the hospital. MAKE SURE YOU: Deep Vein Thrombosis A deep vein thrombosis (DVT) is a blood clot that develops in the deep, larger veins of the leg, arm, or pelvis. These are more dangerous than clots that might form in veins near the surface of the body. A DVT can lead to serious and even life-threatening complications if the clot breaks off and travels in the bloodstream to the lungs.  A DVT can damage the valves in your leg veins so that instead of flowing upward, the blood pools in the lower leg. This is called post-thrombotic syndrome, and it can result in pain, swelling, discoloration, and sores on the leg. CAUSES Usually, several things contribute to the formation of blood clots. Contributing factors include:  The flow of blood slows down.  The inside of the vein is damaged in some way.  You have a condition that makes blood clot more easily. RISK FACTORS Some people are more likely than others to develop blood clots. Risk factors include:   Smoking.  Being overweight (obese).  Sitting or lying still for a long time. This includes long-distance travel, paralysis, or recovery from an illness or surgery. Other factors that increase risk are:   Older age, especially over 37 years of age.  Having a family history of blood clots or if you have already had a blot clot.  Having major or lengthy surgery. This is especially true for surgery on the hip, knee, or belly (abdomen). Hip surgery is particularly high risk.  Having a long, thin tube (catheter) placed inside a vein during a medical procedure.  Breaking a hip or leg.  Having cancer or cancer treatment.  Pregnancy and childbirth.  Hormone changes make the blood clot more easily during pregnancy.  The fetus puts pressure on the veins of the pelvis.  There is a risk of injury to veins during delivery or a caesarean delivery. The risk is highest just after childbirth.  Medicines containing the female hormone  estrogen. This includes birth control pills and hormone replacement therapy.  Other circulation or heart problems.  SIGNS AND SYMPTOMS When a clot forms, it can either partially or totally block the blood flow in that vein. Symptoms of a DVT can include:  Swelling of the leg or arm, especially if one side is much worse.  Warmth and redness of the leg or arm, especially if one side is much worse.  Pain in an arm or leg. If the clot is in the leg, symptoms may be more noticeable or worse when standing or walking. The symptoms of a DVT that has traveled to the lungs (pulmonary embolism, PE) usually start suddenly and include:  Shortness of breath.  Coughing.  Coughing up blood or blood-tinged mucus.  Chest pain. The chest pain is often worse with deep breaths.  Rapid heartbeat. Anyone with these symptoms should get emergency medical treatment right away. Do not wait to see if the symptoms will go away. Call your local emergency services (911 in the [place] if you have these symptoms. Do not drive yourself to the hospital. DIAGNOSIS If a DVT is suspected, your health care provider will  take a full medical history and perform a physical exam. Tests that also may be required include:  Blood tests, including studies of the clotting properties of the blood.  Ultrasound to see if you have clots in your legs or lungs.  X-rays to show the flow of blood when dye is injected into the veins (venogram).  Studies of your lungs if you have any chest symptoms. PREVENTION  Exercise the legs regularly. Take a brisk 30-minute walk every day.  Maintain a weight that is appropriate for your height.  Avoid sitting or lying in bed for long periods of time without moving your legs.  Women, particularly those over the age of 31 years, should consider the risks and benefits of taking estrogen medicines, including birth control pills.  Do not smoke, especially if you take estrogen  medicines.  Long-distance travel can increase your risk of DVT. You should exercise your legs by walking or pumping the muscles every hour.  Many of the risk factors above relate to situations that exist with hospitalization, either for illness, injury, or elective surgery. Prevention may include medical and nonmedical measures.  Your health care provider will assess you for the need for venous thromboembolism prevention when you are admitted to the hospital. If you are having surgery, your surgeon will assess you the day of or day after surgery. TREATMENT Once identified, a DVT can be treated. It can also be prevented in some circumstances. Once you have had a DVT, you may be at increased risk for a DVT in the future. The most common treatment for DVT is blood-thinning (anticoagulant) medicine, which reduces the blood's tendency to clot. Anticoagulants can stop new blood clots from forming and stop old clots from growing. They cannot dissolve existing clots. Your body does this by itself over time. Anticoagulants can be given by mouth, through an IV tube, or by injection. Your health care provider will determine the best program for you. Other medicines or treatments that may be used are:  Heparin or related medicines (low molecular weight heparin) are often the first treatment for a blood clot. They act quickly. However, they cannot be taken orally and must be given either in shot form or by IV tube.  Heparin can cause a fall in a component of blood that stops bleeding and forms blood clots (platelets). You will be monitored with blood tests to be sure this does not occur.  Warfarin is an anticoagulant that can be swallowed. It takes a few days to start working, so usually heparin or related medicines are used in combination. Once warfarin is working, heparin is usually stopped.  Factor Xa inhibitor medicines, such as rivaroxaban and apixaban, also reduce blood clotting. These medicines are taken  orally and can often be used without heparin or related medicines.  Less commonly, clot dissolving drugs (thrombolytics) are used to dissolve a DVT. They carry a high risk of bleeding, so they are used mainly in severe cases where your life or a part of your body is threatened.  Very rarely, a blood clot in the leg needs to be removed surgically.  If you are unable to take anticoagulants, your health care provider may arrange for you to have a filter placed in a main vein in your abdomen. This filter prevents clots from traveling to your lungs. HOME CARE INSTRUCTIONS  Take all medicines as directed by your health care provider.  Learn as much as you can about DVT.  Wear a medical alert bracelet or  carry a medical alert card.  Ask your health care provider how soon you can go back to normal activities. It is important to stay active to prevent blood clots. If you are on anticoagulant medicine, avoid contact sports.  It is very important to exercise. This is especially important while traveling, sitting, or standing for long periods of time. Exercise your legs by walking or by tightening and relaxing your leg muscles regularly. Take frequent walks.  You may need to wear compression stockings. These are tight elastic stockings that apply pressure to the lower legs. This pressure can help keep the blood in the legs from clotting. Taking Warfarin Warfarin is a daily medicine that is taken by mouth. Your health care provider will advise you on the length of treatment (usually 3-6 months, sometimes lifelong). If you take warfarin:  Understand how to take warfarin and foods that can affect how warfarin works in Veterinary surgeon.  Too much and too little warfarin are both dangerous. Too much warfarin increases the risk of bleeding. Too little warfarin continues to allow the risk for blood clots. Warfarin and Regular Blood Testing While taking warfarin, you will need to have regular blood tests to measure  your blood clotting time. These blood tests usually include both the prothrombin time (PT) and international normalized ratio (INR) tests. The PT and INR results allow your health care provider to adjust your dose of warfarin. It is very important that you have your PT and INR tested as often as directed by your health care provider.  Warfarin and Your Diet Avoid major changes in your diet, or notify your health care provider before changing your diet. Arrange a visit with a registered dietitian to answer your questions. Many foods, especially foods high in vitamin K, can interfere with warfarin and affect the PT and INR results. You should eat a consistent amount of foods high in vitamin K. Foods high in vitamin K include:   Spinach, kale, broccoli, cabbage, collard and turnip greens, Brussels sprouts, peas, cauliflower, seaweed, and parsley.  Beef and pork liver.  Green tea.  Soybean oil. Warfarin with Other Medicines Many medicines can interfere with warfarin and affect the PT and INR results. You must:  Tell your health care provider about any and all medicines, vitamins, and supplements you take, including aspirin and other over-the-counter anti-inflammatory medicines. Be especially cautious with aspirin and anti-inflammatory medicines. Ask your health care provider before taking these.  Do not take or discontinue any prescribed or over-the-counter medicine except on the advice of your health care provider or pharmacist. Warfarin Side Effects Warfarin can have side effects, such as easy bruising and difficulty stopping bleeding. Ask your health care provider or pharmacist about other side effects of warfarin. You will need to:  Hold pressure over cuts for longer than usual.  Notify your dentist and other health care providers that you are taking warfarin before you undergo any procedures where bleeding may occur. Warfarin with Alcohol and Tobacco   Drinking alcohol frequently can  increase the effect of warfarin, leading to excess bleeding. It is best to avoid alcoholic drinks or to consume only very small amounts while taking warfarin. Notify your health care provider if you change your alcohol intake.   Do not use any tobacco products including cigarettes, chewing tobacco, or electronic cigarettes. If you smoke, quit. Ask your health care provider for help with quitting smoking. Alternative Medicines to Warfarin: Factor Xa Inhibitor Medicines  These blood-thinning medicines are taken by mouth,  usually for several weeks or longer. It is important to take the medicine every single day at the same time each day.  There are no regular blood tests required when using these medicines.  There are fewer food and drug interactions than with warfarin.  The side effects of this class of medicine are similar to those of warfarin, including excessive bruising or bleeding. Ask your health care provider or pharmacist about other potential side effects. SEEK MEDICAL CARE IF:  You notice a rapid heartbeat.  You feel weaker or more tired than usual.  You feel faint.  You notice increased bruising.  You feel your symptoms are not getting better in the time expected.  You believe you are having side effects of medicine. SEEK IMMEDIATE MEDICAL CARE IF:  You have chest pain.  You have trouble breathing.  You have new or increased swelling or pain in one leg.  You cough up blood.  You notice blood in vomit, in a bowel movement, or in urine. MAKE SURE YOU:  Understand these instructions.  Will watch your condition.  Will get help right away if you are not doing well or get worse. Document Released: 06/10/2005 Document Revised: 10/25/2013 Document Reviewed: 02/15/2013 [facility] Patient Information 2015 [place], [place]. This information is not intended to replace advice given to you by your health care provider. Make sure you discuss any questions you have with your health  care provider.   Understand these instructions.  Will watch your condition.  Will get help right away if you are not doing well or get worse. Document Released: 03/20/2005 Document Revised: 06/15/2013 Document Reviewed: 01/14/2008 [facility] Patient Information 2015 [place], [place]. This information is not intended to replace advice given to you by your health care provider. Make sure you discuss any questions you have with your health care provider.

## 2014-07-25 NOTE — ED Notes (Signed)
Called pharmacy will send medication shortly.

## 2014-07-25 NOTE — ED Notes (Signed)
Doctor at bedside.

## 2014-07-25 NOTE — ED Notes (Signed)
EDP at bedside  

## 2014-07-25 NOTE — ED Provider Notes (Addendum)
CSN: [id number]     Arrival date & time 07/25/14  1738 History   None    Chief Complaint  Patient presents with  . Chest Pain   (Consider location/radiation/quality/duration/timing/severity/associated sxs/prior Treatment) HPI           49 year old female presents complaining of chest pain. Since this morning she has chest pain that comes and goes. She describes it as feeling like someone is stabbing her in her chest. It is severe, 9 at 10 in severity. Also she notes that for the past week she has had progressive swelling of her right leg. It started in her knee and that her leg started swelling distal to the knee. The leg was painful as well. No recent travel. No history of DVT or PE.  Past Medical History  Diagnosis Date  . Allergy   . Depression   . Diabetes mellitus   . Osteoarthritis     Facet hypertrophy with injections  . Multiple thyroid nodules   . Gastric ulcer   . Migraine   . Grave's disease    Past Surgical History  Procedure Laterality Date  . Axillary lymph node dissection  2001  . Tubal ligation  1995   Family History  Problem Relation Age of Onset  . Hypertension Mother   . Diabetes Mother   . Cancer Mother     rectal  . Heart disease Father 28    MI x5  . Hypertension Father   . Diabetes Father   . Cancer Sister 21    breast   History  Substance Use Topics  . Smoking status: Current Every Day Smoker -- 0.10 packs/day    Types: Cigarettes  . Smokeless tobacco: Not on file  . Alcohol Use: Yes     Comment: once a year   OB History    No data available     Review of Systems  Constitutional: Negative for fever and chills.  Respiratory: Positive for shortness of breath.   Cardiovascular: Positive for chest pain and leg swelling.  All other systems reviewed and are negative.   Allergies  Ibuprofen  Home Medications   Prior to Admission medications   Medication Sig Start Date End Date Taking? Authorizing Provider  albuterol (PROAIR HFA) 108  (90 BASE) MCG/ACT inhaler Inhale 2 puffs into the lungs every 4 (four) hours as needed for wheezing. 10/15/13   Coral Spikes, DO  beclomethasone (QVAR) 80 MCG/ACT inhaler Inhale 2 puffs into the lungs 2 (two) times daily. 04/17/14   Harden Mo, MD  benzonatate (TESSALON) 100 MG capsule take 1 capsule by mouth twice a day for cough 03/28/14   Coral Spikes, DO  Blood Glucose Monitoring Suppl (ONE TOUCH ULTRA SYSTEM KIT) W/DEVICE KIT 1 kit by Does not apply route once. 10/15/13   Coral Spikes, DO  cyclobenzaprine (FLEXERIL) 10 MG tablet Take 1 tablet (10 mg total) by mouth 3 (three) times daily as needed for muscle spasms. 01/13/14   Renee A Kuneff, DO  esomeprazole (NEXIUM) 40 MG capsule Take 40 mg by mouth daily. 12/07/13   Coral Spikes, DO  glimepiride (AMARYL) 2 MG tablet Take 1 tablet (2 mg total) by mouth daily with breakfast. 10/15/13   Coral Spikes, DO  glucose blood (ONE TOUCH ULTRA TEST) test strip Use as instructed 10/15/13   Coral Spikes, DO  ibuprofen (ADVIL,MOTRIN) 600 MG tablet Take 1 tablet (600 mg total) by mouth every 8 (eight) hours as needed for  pain. 03/19/13   Coral Spikes, DO  levothyroxine (SYNTHROID, LEVOTHROID) 175 MCG tablet Take 1 tablet (175 mcg total) by mouth daily. 05/24/14   Coral Spikes, DO  metFORMIN (GLUCOPHAGE) 1000 MG tablet Take 1 tablet (1,000 mg total) by mouth daily. 12/07/13   Coral Spikes, DO  naproxen (NAPROSYN) 500 MG tablet Take 1 tablet (500 mg total) by mouth 2 (two) times daily with a meal. As needed. 03/19/13   Coral Spikes, DO  ONE TOUCH LANCETS MISC Use up to 6 times daily to test blood sugar. 10/15/13   Coral Spikes, DO  predniSONE (DELTASONE) 20 MG tablet Take 1 tablet (20 mg total) by mouth 2 (two) times daily. 04/17/14   Harden Mo, MD  rizatriptan (MAXALT-MLT) 10 MG disintegrating tablet Take 1 tablet (10 mg total) by mouth as needed for migraine. May repeat in 2 hours if needed 03/31/13   Coral Spikes, DO  sucralfate (CARAFATE) 1 G tablet Take 1  tablet (1 g total) by mouth 4 (four) times daily -  with meals and at bedtime. 11/26/13   Renee A Kuneff, DO  traMADol (ULTRAM) 50 MG tablet 1 to 2 tablets every 8 hours as needed for cough 04/17/14   Harden Mo, MD   BP 154/90 mmHg  Pulse 73  Temp(Src) 98.9 F (37.2 C) (Oral)  Resp 18  SpO2 100% Physical Exam  Constitutional: She is oriented to person, place, and time. Vital signs are normal. She appears well-developed and well-nourished. She appears distressed.  HENT:  Head: Normocephalic and atraumatic.  Cardiovascular: Normal rate, regular rhythm and normal heart sounds.   Pulmonary/Chest: Effort normal and breath sounds normal. No respiratory distress.  Musculoskeletal:  Minimal appreciable swelling in the leg   Neurological: She is alert and oriented to person, place, and time. She has normal strength. Coordination normal.  Skin: Skin is warm and dry. No rash noted. She is not diaphoretic.  Psychiatric: She has a normal mood and affect. Judgment normal.  Nursing note and vitals reviewed.   ED Course  ED EKG  Date/Time: 07/29/2014 8:22 AM Performed by: Allena Katz, H Authorized by: Allena Katz, H Comparison: not compared with previous ECG  Rhythm: sinus rhythm Rate: normal QRS axis: normal Conduction: conduction normal ST Segments: ST segments normal T Waves: T waves normal Other: no other findings Clinical impression: normal ECG   (including critical care time) Labs Review Labs Reviewed - No data to display  Imaging Review No results found.   MDM   1. Other chest pain   2. SOB (shortness of breath)   3. Right leg swelling    Unilateral leg swelling and CP, concern for PE and DVT, transferred to ED for eval of CP    Liam Graham, PA-C 07/25/14 [address], PA-C 07/29/14 [phone]

## 2014-07-25 NOTE — ED Notes (Signed)
Pt reports right leg swelling for several days but is better today. This morning started having CP this is central. Reports sob, feels nauseated.

## 2014-07-25 NOTE — ED Notes (Addendum)
C/o pain is '10' on 1-10 scale; NAD, w/d/color good. Feels as if a cold is coming on, leg swelling

## 2014-07-25 NOTE — ED Notes (Signed)
C/o chest pain since this AM, feels like a cold is coming on. leg swollen x 4 days

## 2014-07-25 NOTE — ED Provider Notes (Signed)
CSN: [id number]     Arrival date & time 07/25/14  1843 History   First MD Initiated Contact with Patient 07/25/14 1952     Chief Complaint  Patient presents with  . Chest Pain  . Leg Swelling     (Consider location/radiation/quality/duration/timing/severity/associated sxs/prior Treatment) Patient is a 49 y.o. female presenting with chest pain. The history is provided by the patient. No language interpreter was used.  Chest Pain Pain location:  Substernal area Pain quality: sharp   Pain radiates to:  Mid back Pain radiates to the back: no   Pain severity:  Severe Duration:  1 day Timing:  Intermittent Progression:  Waxing and waning Chronicity:  New Context: at rest   Relieved by:  Nothing Worsened by:  Nothing tried Ineffective treatments:  None tried Associated symptoms: lower extremity edema and shortness of breath   Associated symptoms: no abdominal pain, no back pain, no cough, no diaphoresis, no fatigue, no fever, no headache, no nausea, no numbness, no palpitations, not vomiting and no weakness  Heartburn: RLE, 1 week.   Risk factors: diabetes mellitus, obesity and smoking   Risk factors: no birth control, no high cholesterol, no hypertension, no prior DVT/PE and no surgery     Past Medical History  Diagnosis Date  . Allergy   . Depression   . Diabetes mellitus   . Osteoarthritis     Facet hypertrophy with injections  . Multiple thyroid nodules   . Gastric ulcer   . Migraine   . Grave's disease    Past Surgical History  Procedure Laterality Date  . Axillary lymph node dissection  2001  . Tubal ligation  1995   Family History  Problem Relation Age of Onset  . Hypertension Mother   . Diabetes Mother   . Cancer Mother     rectal  . Heart disease Father 56    MI x5  . Hypertension Father   . Diabetes Father   . Cancer Sister 40    breast   History  Substance Use Topics  . Smoking status: Current Every Day Smoker -- 0.10 packs/day    Types: Cigarettes   . Smokeless tobacco: Not on file  . Alcohol Use: Yes     Comment: once a year   OB History    No data available     Review of Systems  Constitutional: Negative for fever, chills, diaphoresis, activity change, appetite change and fatigue.  HENT: Negative for congestion, facial swelling, rhinorrhea and sore throat.   Eyes: Negative for photophobia and discharge.  Respiratory: Positive for shortness of breath. Negative for cough and chest tightness.   Cardiovascular: Positive for chest pain. Negative for palpitations and leg swelling.  Gastrointestinal: Negative for nausea, vomiting, abdominal pain and diarrhea. Heartburn: RLE, 1 week.  Endocrine: Negative for polydipsia and polyuria.  Genitourinary: Negative for dysuria, frequency, difficulty urinating and pelvic pain.  Musculoskeletal: Negative for back pain, arthralgias, neck pain and neck stiffness.  Skin: Negative for color change and wound.  Allergic/Immunologic: Negative for immunocompromised state.  Neurological: Negative for facial asymmetry, weakness, numbness and headaches.  Hematological: Does not bruise/bleed easily.  Psychiatric/Behavioral: Negative for confusion and agitation.      Allergies  Ibuprofen  Home Medications   Prior to Admission medications   Medication Sig Start Date End Date Taking? Authorizing Provider  albuterol (PROAIR HFA) 108 (90 BASE) MCG/ACT inhaler Inhale 2 puffs into the lungs every 4 (four) hours as needed for wheezing. 10/15/13  Yes Jayce  G Cook, DO  beclomethasone (QVAR) 80 MCG/ACT inhaler Inhale 2 puffs into the lungs 2 (two) times daily. 04/17/14  Yes Harden Mo, MD  benzonatate (TESSALON) 100 MG capsule take 1 capsule by mouth twice a day for cough 03/28/14  Yes Coral Spikes, DO  levothyroxine (SYNTHROID, LEVOTHROID) 175 MCG tablet Take 1 tablet (175 mcg total) by mouth daily. 05/24/14  Yes Coral Spikes, DO  metFORMIN (GLUCOPHAGE) 1000 MG tablet Take 1 tablet (1,000 mg total) by mouth  daily. 12/07/13  Yes Coral Spikes, DO  rizatriptan (MAXALT-MLT) 10 MG disintegrating tablet Take 1 tablet (10 mg total) by mouth as needed for migraine. May repeat in 2 hours if needed 03/31/13  Yes Jayce G Cook, DO  Blood Glucose Monitoring Suppl (ONE TOUCH ULTRA SYSTEM KIT) W/DEVICE KIT 1 kit by Does not apply route once. 10/15/13   Coral Spikes, DO  cyclobenzaprine (FLEXERIL) 10 MG tablet Take 1 tablet (10 mg total) by mouth 3 (three) times daily as needed for muscle spasms. Patient not taking: Reported on 07/25/2014 01/13/14   Renee A Kuneff, DO  esomeprazole (NEXIUM) 40 MG capsule Take 1 capsule (40 mg total) by mouth daily. 07/25/14   Ernestina Patches, MD  glimepiride (AMARYL) 2 MG tablet Take 1 tablet (2 mg total) by mouth daily with breakfast. Patient not taking: Reported on 07/25/2014 10/15/13   Coral Spikes, DO  glucose blood (ONE TOUCH ULTRA TEST) test strip Use as instructed 10/15/13   Coral Spikes, DO  HYDROcodone-acetaminophen (NORCO) 5-325 MG per tablet Take 1 tablet by mouth every 6 (six) hours as needed. 07/25/14   Ernestina Patches, MD  ibuprofen (ADVIL,MOTRIN) 600 MG tablet Take 1 tablet (600 mg total) by mouth every 8 (eight) hours as needed for pain. Patient not taking: Reported on 07/25/2014 03/19/13   Coral Spikes, DO  naproxen (NAPROSYN) 500 MG tablet Take 1 tablet (500 mg total) by mouth 2 (two) times daily with a meal. As needed. Patient not taking: Reported on 07/25/2014 03/19/13   Coral Spikes, DO  ONE TOUCH LANCETS MISC Use up to 6 times daily to test blood sugar. 10/15/13   Coral Spikes, DO  predniSONE (DELTASONE) 20 MG tablet Take 1 tablet (20 mg total) by mouth 2 (two) times daily. Patient not taking: Reported on 07/25/2014 04/17/14   Harden Mo, MD  sucralfate (CARAFATE) 1 G tablet Take 1 tablet (1 g total) by mouth 4 (four) times daily -  with meals and at bedtime. Patient not taking: Reported on 07/25/2014 11/26/13   Renee A Kuneff, DO  traMADol (ULTRAM) 50 MG tablet 1 to 2 tablets every 8  hours as needed for cough Patient not taking: Reported on 07/25/2014 04/17/14   Harden Mo, MD   BP 147/74 mmHg  Pulse 71  Temp(Src) 98.4 F (36.9 C) (Oral)  Resp 18  SpO2 100%  LMP 07/22/2014 Physical Exam  Constitutional: She is oriented to person, place, and time. She appears well-developed and well-nourished. No distress.  HENT:  Head: Normocephalic and atraumatic.  Mouth/Throat: No oropharyngeal exudate.  Eyes: Pupils are equal, round, and reactive to light.  Neck: Normal range of motion. Neck supple.  Cardiovascular: Normal rate, regular rhythm and normal heart sounds.  Exam reveals no gallop and no friction rub.   No murmur heard. Pulmonary/Chest: Effort normal and breath sounds normal. No respiratory distress. She has no wheezes. She has no rales.  Abdominal: Soft. Bowel sounds are normal.  She exhibits no distension and no mass. There is no tenderness. There is no rebound and no guarding.  Musculoskeletal: Normal range of motion. She exhibits no edema or tenderness.  Nonpitting edema of RLE compared to LLE  Neurological: She is alert and oriented to person, place, and time.  Skin: Skin is warm and dry.  Psychiatric: She has a normal mood and affect.    ED Course  Procedures (including critical care time) Labs Review Labs Reviewed  CBC - Abnormal; Notable for the following:    Hemoglobin 10.2 (*)    HCT 32.3 (*)    MCV 74.8 (*)    MCH 23.6 (*)    RDW 16.4 (*)    All other components within normal limits  BASIC METABOLIC PANEL - Abnormal; Notable for the following:    GFR calc non Af Amer 62 (*)    GFR calc Af Amer 72 (*)    All other components within normal limits  I-STAT TROPOININ, ED  I-STAT TROPOININ, ED    Imaging Review Dg Chest 2 View  07/25/2014   CLINICAL DATA:  Initial evaluation for acute left-sided chest pain with bilateral leg swelling.  EXAM: CHEST  2 VIEW  COMPARISON:  Prior radiograph from 01/12/2013  FINDINGS: The cardiac and mediastinal  silhouettes are stable in size and contour, and remain within normal limits.  The lungs are normally inflated. Mild diffuse bronchitic changes are similar to prior, like related a history of smoking. No airspace consolidation, pleural effusion, or pulmonary edema is identified. There is no pneumothorax.  No acute osseous abnormality identified.  IMPRESSION: No active cardiopulmonary disease.   Electronically Signed   By: Jeannine Boga M.D.   On: 07/25/2014 20:30   Ct Angio Chest Pe W/cm &/or Wo Cm  07/25/2014   CLINICAL DATA:  Acute onset of intermittent stabbing chest pain, with progressive right leg swelling. Initial encounter.  EXAM: CT ANGIOGRAPHY CHEST WITH CONTRAST  TECHNIQUE: Multidetector CT imaging of the chest was performed using the standard protocol during bolus administration of intravenous contrast. Multiplanar CT image reconstructions and MIPs were obtained to evaluate the vascular anatomy.  CONTRAST:  126m OMNIPAQUE IOHEXOL 350 MG/ML SOLN  COMPARISON:  Chest radiograph performed earlier today at 7:56 p.m., and CT of the chest performed 01/10/2012  FINDINGS: There is no evidence of pulmonary embolus.  Minimal bibasilar atelectasis is noted. The lungs are otherwise clear. There is no evidence of significant focal consolidation, pleural effusion or pneumothorax. No masses are identified; no abnormal focal contrast enhancement is seen.  The mediastinum is unremarkable in appearance. No mediastinal lymphadenopathy is seen. No pericardial effusion is identified. The great vessels are grossly unremarkable. The thyroid gland is not definitely seen. No axillary lymphadenopathy is seen.  The visualized portions of the liver and spleen are unremarkable. The visualized portions of the pancreas, gallbladder, adrenal glands and kidneys are within normal limits. There is apparent mild diffuse wall thickening at the antrum of the stomach, of uncertain significance.  Wall thickening is noted along the  distal esophagus. This could reflect mild esophagitis.  No acute osseous abnormalities are seen.  Review of the MIP images confirms the above findings.  IMPRESSION: 1. No evidence of pulmonary embolus. 2. Minimal bibasilar atelectasis noted; lungs otherwise clear. 3. Apparent mild diffuse wall thickening of the antrum of the stomach, of uncertain significance. Would correlate for any associated symptoms, and consider further evaluation as deemed clinically appropriate. 4. Wall thickening along the distal esophagus could reflect mild  esophagitis.   Electronically Signed   By: Garald Balding M.D.   On: 07/25/2014 22:23     EKG Interpretation   Date/Time:  Monday July 25 2014 18:50:00 EST Ventricular Rate:  65 PR Interval:  158 QRS Duration: 82 QT Interval:  430 QTC Calculation: 447 R Axis:   53 Text Interpretation:  Normal sinus rhythm Cannot rule out Anterior infarct  , age undetermined Abnormal ECG No significant change since last tracing  Confirmed by DOCHERTY  MD, MEGAN [phone]) on 07/25/2014 7:55:43 PM      MDM   Final diagnoses:  Chest pain  Right leg swelling  Gastroesophageal reflux disease with esophagitis    Pt is a 49 y.o. female with Pmhx as above who presents with intermittent CP since this morning as well as one week of progressive pain and swelling in her right leg starting in her knee and traveling distally.  No recent travel.  No history of PE or DVT.  She is a current smoker.  No current estrogen use.  No recent travel history.   Using well's criteria risk of PE/DVT requires further evaluation.  CTA ordered and was negative for PE but did show some wall thickening of the antrum of the stomach and distal esophagus, which may be causing chest pain.  She reports having a gastric ulcer in the past and has been on Nexium but is not currently taking it.  Delta troponin is negative.  We'll restart her Nexium.  Outpatient DVT study ordered for tomorrow.  Lovenox subcutaneous,  given.  Lavonia Dana evaluation in the Emergency Department is complete. It has been determined that no acute conditions requiring further emergency intervention are present at this time. The patient/guardian have been advised of the diagnosis and plan. We have discussed signs and symptoms that warrant return to the ED, such as changes or worsening in symptoms, worsening pain, SOB, fever.       Ernestina Patches, MD 07/26/14 [phone]

## 2014-07-26 ENCOUNTER — Ambulatory Visit (HOSPITAL_COMMUNITY)
Admission: RE | Admit: 2014-07-26 | Discharge: 2014-07-26 | Disposition: A | Discharge status: Home / Self Care | Payer: 59 | Source: Ambulatory Visit | Attending: Emergency Medicine | Admitting: Emergency Medicine

## 2014-07-26 ENCOUNTER — Emergency Department (HOSPITAL_COMMUNITY)
Admission: EM | Admit: 2014-07-26 | Discharge: 2014-07-26 | Discharge status: Absent without leave | Payer: Medicare Other

## 2014-07-26 DIAGNOSIS — R0602 Shortness of breath: Secondary | ICD-10-CM | POA: Diagnosis not present

## 2014-07-26 DIAGNOSIS — F17200 Nicotine dependence, unspecified, uncomplicated: Secondary | ICD-10-CM

## 2014-07-26 DIAGNOSIS — R609 Edema, unspecified: Secondary | ICD-10-CM | POA: Diagnosis not present

## 2014-07-26 DIAGNOSIS — R079 Chest pain, unspecified: Secondary | ICD-10-CM | POA: Diagnosis not present

## 2014-07-26 DIAGNOSIS — M79609 Pain in unspecified limb: Secondary | ICD-10-CM | POA: Diagnosis not present

## 2014-07-26 NOTE — Progress Notes (Signed)
Right lower extremity venous duplex completed.  Right:  No evidence of DVT, superficial thrombosis, or Baker's cyst.  Left:  Negative for DVT in the common femoral vein.  

## 2014-07-27 ENCOUNTER — Ambulatory Visit (INDEPENDENT_AMBULATORY_CARE_PROVIDER_SITE_OTHER): Payer: 59 | Admitting: Family Medicine

## 2014-07-27 ENCOUNTER — Encounter: Payer: Self-pay | Admitting: Family Medicine

## 2014-07-27 VITALS — BP 136/73 | HR 75 | Temp 98.2°F | Ht 64.5 in | Wt 194.3 lb

## 2014-07-27 DIAGNOSIS — J069 Acute upper respiratory infection, unspecified: Secondary | ICD-10-CM | POA: Diagnosis not present

## 2014-07-27 MED ORDER — AMOXICILLIN-POT CLAVULANATE 875-125 MG PO TABS: 1.0000 | ORAL_TABLET | Freq: Two times a day (BID) | ORAL | Status: DC

## 2014-07-27 NOTE — Progress Notes (Signed)
   Subjective:    Patient ID: Kendra Hall, female    DOB: [birth year], 49 y.o.   MRN: [id number]  HPI 49 year old presents for a same day appointment with complaints of URI symptoms.  1) URI symptoms  Patient reports head congestion and cough x 4 days.  No reports of SOB, fever, sore throat.  No reported sick contacts.  No exacerbating or relieving factors.   Review of Systems Per HPI    Objective:   Physical Exam Filed Vitals:   07/27/14 1457  BP: 136/73  Pulse: 75  Temp: 98.2 F (36.8 C)   Exam: General: well appearing, NAD. HEENT: NCAT. Normal TM's bilaterally. Oropharynx clear. + tenderness of maxillary sinuses.  Cardiovascular: RRR. No murmurs, rubs, or gallops. Respiratory: CTAB. No rales, rhonchi, or wheeze.     Assessment & Plan:  See Problem List

## 2014-07-27 NOTE — Patient Instructions (Signed)
It was nice to see you today.  Your leg exam was normal.  Please follow up if you continue to have swelling.  As far as your other symptoms are concerned,this is likely viral in nature.  I have prescribed an antibiotic (for possible sinusitis) if you fail to improve or worsen.   Take care  Dr. Lacinda Axon

## 2014-07-27 NOTE — Assessment & Plan Note (Signed)
Likely viral in nature.   However, given significant maxillary sinus tenderness on exam patient was given Augmentin to be used if she worsens or fails to improve.

## 2014-08-15 ENCOUNTER — Encounter: Payer: Self-pay | Admitting: Family Medicine

## 2014-08-15 ENCOUNTER — Ambulatory Visit (INDEPENDENT_AMBULATORY_CARE_PROVIDER_SITE_OTHER): Payer: 59 | Admitting: Family Medicine

## 2014-08-15 VITALS — BP 140/75 | HR 77 | Temp 98.1°F | Ht 64.0 in | Wt 196.3 lb

## 2014-08-15 DIAGNOSIS — M25561 Pain in right knee: Secondary | ICD-10-CM | POA: Diagnosis not present

## 2014-08-15 MED ORDER — NAPROXEN 500 MG PO TABS: 500.0000 mg | ORAL_TABLET | Freq: Two times a day (BID) | ORAL | Status: DC

## 2014-08-15 NOTE — Progress Notes (Signed)
Kendra Hall is a 49 y.o. female who presents today for R knee pain.  R Knee Pain/effusion - Pt states this has been ongoing now for about three weeks.  Denies acute injury but woke up with pain and swelling in her R knee that spread to her lower extremity.  She was seen at urgent care two days later due to recalcitrant pain and had [place] of leg that did not show DVT.  As well, she developed acute dyspnea and was seen in ED around that time, where initially cardiac testing and CT angio were performed to r/o PE and cardiac origin.  She continued to have the R leg pain and states she sometimes has locking, catching, effusion and pain.  Does not give out on her and has never injured the leg before.  Has not tried much for the pain.    Past Medical History  Diagnosis Date  . Allergy   . Depression   . Diabetes mellitus   . Osteoarthritis     Facet hypertrophy with injections  . Multiple thyroid nodules   . Gastric ulcer   . Migraine   . Grave's disease     History  Smoking status  . Current Every Day Smoker -- 0.10 packs/day  . Types: Cigarettes  Smokeless tobacco  . Not on file    Family History  Problem Relation Age of Onset  . Hypertension Mother   . Diabetes Mother   . Cancer Mother     rectal  . Heart disease Father 38    MI x5  . Hypertension Father   . Diabetes Father   . Cancer Sister 18    breast    Current Outpatient Prescriptions on File Prior to Visit  Medication Sig Dispense Refill  . albuterol (PROAIR HFA) 108 (90 BASE) MCG/ACT inhaler Inhale 2 puffs into the lungs every 4 (four) hours as needed for wheezing. 1 Inhaler 5  . amoxicillin-clavulanate (AUGMENTIN) 875-125 MG per tablet Take 1 tablet by mouth 2 (two) times daily. 20 tablet 0  . beclomethasone (QVAR) 80 MCG/ACT inhaler Inhale 2 puffs into the lungs 2 (two) times daily. 1 Inhaler 0  . benzonatate (TESSALON) 100 MG capsule take 1 capsule by mouth twice a day for cough 30 capsule 0  . Blood Glucose  Monitoring Suppl (ONE TOUCH ULTRA SYSTEM KIT) W/DEVICE KIT 1 kit by Does not apply route once. 1 each 0  . esomeprazole (NEXIUM) 40 MG capsule Take 1 capsule (40 mg total) by mouth daily. 30 capsule 0  . glimepiride (AMARYL) 2 MG tablet Take 1 tablet (2 mg total) by mouth daily with breakfast. (Patient not taking: Reported on 07/25/2014) 90 tablet 3  . glucose blood (ONE TOUCH ULTRA TEST) test strip Use as instructed 100 each 12  . HYDROcodone-acetaminophen (NORCO) 5-325 MG per tablet Take 1 tablet by mouth every 6 (six) hours as needed. 10 tablet 0  . ibuprofen (ADVIL,MOTRIN) 600 MG tablet Take 1 tablet (600 mg total) by mouth every 8 (eight) hours as needed for pain. (Patient not taking: Reported on 07/25/2014) 30 tablet 0  . levothyroxine (SYNTHROID, LEVOTHROID) 175 MCG tablet Take 1 tablet (175 mcg total) by mouth daily. 30 tablet 1  . metFORMIN (GLUCOPHAGE) 1000 MG tablet Take 1 tablet (1,000 mg total) by mouth daily. 90 tablet 3  . ONE TOUCH LANCETS MISC Use up to 6 times daily to test blood sugar. 200 each 3  . rizatriptan (MAXALT-MLT) 10 MG disintegrating tablet Take 1  tablet (10 mg total) by mouth as needed for migraine. May repeat in 2 hours if needed 15 tablet 1   No current facility-administered medications on file prior to visit.    ROS: Per HPI.  All other systems reviewed and are negative.   Physical Exam Filed Vitals:   08/15/14 0842  BP: 140/75  Pulse: 77  Temp: 98.1 F (36.7 C)   Knee: Normal to inspection with no erythema or effusion or obvious bony abnormalities. TTP medial joint line ROM normal in flexion and extension and lower leg rotation. Ligaments with solid consistent endpoints including ACL, PCL, LCL, MCL. Positive Mcmurray's/Thessaly and Adson's compression testing Non painful patellar compression/patellar glide/apprehension  Patellar and quadriceps tendons unremarkable. Hamstring and quadriceps strength is normal.   CT angio 07/25/14 IMPRESSION: 1. No  evidence of pulmonary embolus. 2. Minimal bibasilar atelectasis noted; lungs otherwise clear. 3. Apparent mild diffuse wall thickening of the antrum of the stomach, of uncertain significance. Would correlate for any associated symptoms, and consider further evaluation as deemed clinically appropriate. 4. Wall thickening along the distal esophagus could reflect mild Esophagitis.  RLE Duplex  Summary:  - No obvious evidence of deep vein or superficial thrombosis involving the right lower extremity and left common femoral vein. - No evidence of Baker&'s cyst on the right.

## 2014-08-15 NOTE — Assessment & Plan Note (Signed)
Concern for anterior meniscal tear, degenerative - Conservative management to start including PT, Naproxen, ice, compression - Standing A/P, lateral, flexion, and sunrise of R knee - F/U in two weeks - If no improvement, consider trial of Steroid injection and if no improvement, MRI of the R knee is appropriate consideration.

## 2014-08-15 NOTE — Patient Instructions (Signed)
Rest the knee. Avoid positions and activities that place excessive pressure on the knee joint until pain and swelling resolve. Such activities include: squatting, kneeling, twisting and pivoting, repetitive bending (eg, stairs, getting out of a seated position, clutch and pedal pushing), jogging, exercise classes, dancing, swimming using the frog or whip kick, and bicycling. ?Apply ice to the knee for 15 minutes every four to six hours, while keeping the leg elevated Consider a  patellar restraining brace   Please get your x-rays and start the naprosyn  Thanks, Dr. Awanda Mink

## 2014-08-26 ENCOUNTER — Other Ambulatory Visit: Payer: Self-pay | Admitting: Family Medicine

## 2014-08-26 ENCOUNTER — Ambulatory Visit
Admission: RE | Admit: 2014-08-26 | Discharge: 2014-08-26 | Disposition: A | Discharge status: Home / Self Care | Payer: 59 | Source: Ambulatory Visit | Attending: Family Medicine | Admitting: Family Medicine

## 2014-08-26 DIAGNOSIS — M25461 Effusion, right knee: Secondary | ICD-10-CM | POA: Diagnosis not present

## 2014-08-26 DIAGNOSIS — M25561 Pain in right knee: Secondary | ICD-10-CM

## 2014-08-29 ENCOUNTER — Telehealth: Payer: Self-pay | Admitting: Family Medicine

## 2014-08-29 NOTE — Telephone Encounter (Signed)
Pt is requesting her xray results

## 2014-08-30 NOTE — Telephone Encounter (Signed)
Patient has decided on getting a MRI

## 2014-08-30 NOTE — Telephone Encounter (Signed)
Spoke with patient and informed her of below results. Patient states that her knee is still swollen and that the naproxen is not helping. Patient would like to know what the next step would be.

## 2014-08-30 NOTE — Telephone Encounter (Signed)
Potential next steps:  1) Referral to [facility] for [place] 2) Steroid injection 3) Possible MRI

## 2014-08-30 NOTE — Telephone Encounter (Signed)
Small amount of fluid on the knee. Otherwise normal.

## 2014-08-30 NOTE — Telephone Encounter (Signed)
Patient will need to be re-evaluated prior to obtaining MRI.

## 2014-10-24 ENCOUNTER — Encounter: Payer: Self-pay | Admitting: Family Medicine

## 2014-10-24 ENCOUNTER — Ambulatory Visit (INDEPENDENT_AMBULATORY_CARE_PROVIDER_SITE_OTHER): Payer: 59 | Admitting: Family Medicine

## 2014-10-24 VITALS — BP 137/79 | HR 85 | Temp 98.5°F | Ht 64.0 in | Wt 196.0 lb

## 2014-10-24 DIAGNOSIS — M542 Cervicalgia: Secondary | ICD-10-CM

## 2014-10-24 MED ORDER — METHYLPREDNISOLONE ACETATE 40 MG/ML IJ SUSP
40.0000 mg | Freq: Once | INTRAMUSCULAR | Status: AC
  Administered 2014-10-24: 40 mg via INTRAMUSCULAR

## 2014-10-24 MED ORDER — CYCLOBENZAPRINE HCL 5 MG PO TABS: 5.0000 mg | ORAL_TABLET | Freq: Three times a day (TID) | ORAL | Status: DC | PRN

## 2014-10-24 NOTE — Assessment & Plan Note (Signed)
Pain most likely 2/2 to trigger point. Injection was conducted in three different trigger points. Injection was supervised by Dr. Nori Riis.  She works in customer service at a desk and this may contribute to her exacerbation her underlying anxiety d/o.  - flexeril given PRN in case the injection didn't improve her pain  - if no improvement could consider PT

## 2014-10-24 NOTE — Patient Instructions (Addendum)
Thank you for coming in,   I sent in flexeril but you don't have to take it three times a day. You can take it once or twice daily.   Please let us know if you have any redness or develop a fever.    Continue using heat and also bengay to loosen up your muscle.    Please bring all of your medications with you to each visit.    Please feel free to call with any questions or concerns at any time, at [phone]. --Dr. Raeford Razor  Trigger Point Injection Trigger points are areas where you have muscle pain. A trigger point injection is a shot given in the trigger point to relieve that pain. A trigger point might feel like a knot in your muscle. It hurts to press on a trigger point. Sometimes the pain spreads out (radiates) to other parts of the body. For example, pressing on a trigger point in your shoulder might cause pain in your arm or neck. You might have one trigger point. Or, you might have more than one. People often have trigger points in their upper back and lower back. They also occur often in the neck and shoulders. Pain from a trigger point lasts for a long time. It can make it hard to keep moving. You might not be able to do the exercise or physical therapy that could help you deal with the pain. A trigger point injection may help. It does not work for everyone. But, it may relieve your pain for a few days or a few months. A trigger point injection does not cure long-lasting (chronic) pain. LET YOUR CAREGIVER KNOW ABOUT:  Any allergies (especially to latex, lidocaine, or steroids).  Blood-thinning medicines that you take. These drugs can lead to bleeding or bruising after an injection. They include:  Aspirin.  Ibuprofen.  Clopidogrel.  Warfarin.  Other medicines you take. This includes all vitamins, herbs, eyedrops, over-the-counter medicines, and creams.  Use of steroids.  Recent infections.  Past problems with numbing medicines.  Bleeding problems.  Surgeries you have  had.  Other health problems. RISKS AND COMPLICATIONS A trigger point injection is a safe treatment. However, problems may develop, such as:  Minor side effects usually go away in 1 to 2 days. These may include:  Soreness.  Bruising.  Stiffness.  More serious problems are rare. But, they may include:  Bleeding under the skin (hematoma).  Skin infection.  Breaking off of the needle under your skin.  Lung puncture.  The trigger point injection may not work for you. BEFORE THE PROCEDURE You may need to stop taking any medicine that thins your blood. This is to prevent bleeding and bruising. Usually these medicines are stopped several days before the injection. No other preparation is needed. PROCEDURE  A trigger point injection can be given in your caregiver's office or in a clinic. Each injection takes 2 minutes or less.  Your caregiver will feel for trigger points. The caregiver may use a marker to circle the area for the injection.  The skin over the trigger point will be washed with a germ-killing (antiseptic) solution.  The caregiver pinches the spot for the injection.  Then, a very thin needle is used for the shot. You may feel pain or a twitching feeling when the needle enters the trigger point.  A numbing solution may be injected into the trigger point. Sometimes a drug to keep down swelling, redness, and warmth (inflammation) is also injected.  Your caregiver  moves the needle around the trigger zone until the tightness and twitching goes away.  After the injection, your caregiver may put gentle pressure over the injection site.  Then it is covered with a bandage. AFTER THE PROCEDURE  You can go right home after the injection.  The bandage can be taken off after a few hours.  You may feel sore and stiff for 1 to 2 days.  Go back to your regular activities slowly. Your caregiver may ask you to stretch your muscles. Do not do anything that takes extra energy for  a few days.  Follow your caregiver's instructions to manage and treat other pain. Document Released: 05/30/2011 Document Revised: 10/05/2012 Document Reviewed: 05/30/2011 [facility] Patient Information 2015 [place], [place]. This information is not intended to replace advice given to you by your health care provider. Make sure you discuss any questions you have with your health care provider.

## 2014-10-24 NOTE — Progress Notes (Signed)
    Subjective   Kendra Hall is a 49 y.o. female that presents for a same day visit  Neck PAIN  Neck pain began 2 weeks ago. She was not lifting anything prior to the pain. It occurred in the middle of the night when it first occurred.   Pain is described as giving her a shot  Patient has tried naproxen (taking for her knee) improved her knee pain. Not helping her muscles in her neck. Tylenol didn't help. Taking pain medication that helped . Pain radiates across her shoulder . History of trauma or injury: no Prior history of similar pain: hx of this pain previously. She has taken hot showers and muscle relaxer's in the past with some improvement.   Having some numbnes and tnigling in her right thumb but no where else.    History  Substance Use Topics  . Smoking status: Current Every Day Smoker -- 0.10 packs/day    Types: Cigarettes  . Smokeless tobacco: Not on file  . Alcohol Use: Yes     Comment: once a year    ROS Per HPI  Objective   BP 137/79 mmHg  Pulse 85  Temp(Src) 98.5 F (36.9 C) (Oral)  Ht 5\' 4"  (1.626 m)  Wt 196 lb (88.905 kg)  BMI 33.63 kg/m2  General: Well appearing, NAD,  HEENT: clear conjunctiva, No LAD  Respiratory/Chest: CTAB, non labored  Cardiovascular: RRR, S1S2 Extremities: 5/5 strength in UE b/l, normal sensation in UE b/l, neurovascularly intact,  Symmetric appearing neck and trapezius. No Erythema or ecchymosis  FROM of neck  TTP of the left trapezius with most tender points just proximal to scapular spine,  FROM of right shoulder active and passive.  Limited active but FROM passive of left shoulder. Left shoulder movement worsened pain  Neuro: normal shrug b/l, normal strength of sternocleidomastoid m to resistance. No cervical spinal tenderness   INJECTION: Left trapezius injection x 3  Patient was given informed consent, signed copy in the chart. Appropriate time out was taken. Area prepped and draped in usual sterile fashion. One cc of  methylprednisolone 40 mg/ml plus two cc of 1% lidocaine without epinephrine was injected into the Left trapezius using a(n) perpendicular approach. The patient tolerated the procedure well. There were no complications. Post procedure instructions were given.   Assessment and Plan   Please refer to problem based charting of assessment and plan

## 2015-01-25 ENCOUNTER — Ambulatory Visit (INDEPENDENT_AMBULATORY_CARE_PROVIDER_SITE_OTHER): Payer: 59 | Admitting: Family Medicine

## 2015-01-25 VITALS — BP 130/90 | HR 80 | Temp 98.4°F | Ht 64.0 in | Wt 195.5 lb

## 2015-01-25 DIAGNOSIS — E1310 Other specified diabetes mellitus with ketoacidosis without coma: Secondary | ICD-10-CM

## 2015-01-25 DIAGNOSIS — E119 Type 2 diabetes mellitus without complications: Secondary | ICD-10-CM | POA: Diagnosis not present

## 2015-01-25 DIAGNOSIS — E038 Other specified hypothyroidism: Secondary | ICD-10-CM

## 2015-01-25 DIAGNOSIS — G43719 Chronic migraine without aura, intractable, without status migrainosus: Secondary | ICD-10-CM | POA: Diagnosis not present

## 2015-01-25 DIAGNOSIS — E1110 Type 2 diabetes mellitus with ketoacidosis without coma: Secondary | ICD-10-CM

## 2015-01-25 DIAGNOSIS — E890 Postprocedural hypothyroidism: Secondary | ICD-10-CM

## 2015-01-25 LAB — TSH: TSH: 51.132 u[IU]/mL — ABNORMAL HIGH (ref 0.350–4.500)

## 2015-01-25 LAB — POCT GLYCOSYLATED HEMOGLOBIN (HGB A1C): Hemoglobin A1C: 6.7

## 2015-01-25 MED ORDER — ESOMEPRAZOLE MAGNESIUM 40 MG PO CPDR: 40.0000 mg | DELAYED_RELEASE_CAPSULE | Freq: Every day | ORAL | Status: DC

## 2015-01-25 MED ORDER — RIZATRIPTAN BENZOATE 10 MG PO TBDP: 10.0000 mg | ORAL_TABLET | ORAL | Status: DC | PRN

## 2015-01-25 MED ORDER — BENZONATATE 100 MG PO CAPS: ORAL_CAPSULE | ORAL | Status: DC

## 2015-01-25 MED ORDER — LEVOTHYROXINE SODIUM 175 MCG PO TABS: 175.0000 ug | ORAL_TABLET | Freq: Every day | ORAL | Status: DC

## 2015-01-25 MED ORDER — ALBUTEROL SULFATE HFA 108 (90 BASE) MCG/ACT IN AERS: 2.0000 | INHALATION_SPRAY | RESPIRATORY_TRACT | Status: DC | PRN

## 2015-01-25 MED ORDER — GLIMEPIRIDE 2 MG PO TABS: 2.0000 mg | ORAL_TABLET | Freq: Every day | ORAL | Status: DC

## 2015-01-25 NOTE — Patient Instructions (Signed)

## 2015-01-25 NOTE — Progress Notes (Signed)
Subjective:     Patient ID: Kendra Hall, female   DOB: 04-Jan-1966, 49 y.o.   MRN: [id number]  HPI [name] is a 49yo female presenting today for annual exam and to meet new provider. # Health Maintenance: - Last BMP 07/2014 normal - Last CBC 07/2014 with mild anemia 10.2 - Last Lipid Panel 12/2013 normal - Last TSH normal in 04/2013. Currently takes 120mcg synthroid - Last A1C 7.4 in 12/2013 - Pap Smear normal in 12/2013. Next due in 2020 - Last colonoscopy in 2015  # Diabetes - Reports that she stopped taking her Metformin many months ago since she believed it was causing occasional "freezing" of her right arm. Arm complains resolved after stopping. Restarted Metformin 1.5 months ago after being "yelled" at that she needed it. Freezing of right arm restarted 1.5 weeks after taking Metformin. Reports stopping Metformin one week ago with improvement of freezing. Refuses to take Metformin. Describes freezing sensation as "like an orange sponge."  - Also prescribed Glimepiride. Has not been taking this medication  The following portions of the patient's history were reviewed and updated as appropriate: allergies, current medications, past family history, past medical history, past social history, past surgical history and problem list.  Review of Systems  Constitutional: Negative for fever.  Respiratory: Negative for shortness of breath.   Cardiovascular: Negative for chest pain.  Skin: Negative for rash.       Objective:   Physical Exam  Constitutional: She is oriented to person, place, and time. She appears well-developed and well-nourished. No distress.  Cardiovascular: Normal rate and regular rhythm.  Exam reveals no gallop and no friction rub.   No murmur heard. Pulmonary/Chest: Effort normal and breath sounds normal. No respiratory distress. She has no wheezes. She has no rales.  Abdominal: Soft. Bowel sounds are normal. She exhibits no distension. There is no tenderness. There is  no rebound.  Musculoskeletal: She exhibits no edema.  Neurological: She is alert and oriented to person, place, and time.  Skin: No rash noted.      Assessment:     Please refer to Problem List for Assessment.    Plan:     Please refer to Problem List for Plan.

## 2015-01-26 ENCOUNTER — Telehealth: Payer: Self-pay | Admitting: Family Medicine

## 2015-01-26 NOTE — Telephone Encounter (Signed)
Attempted to contact concerning elevated TSH to 51.132 without response. Currently prescribed Synthroid 182mcg daily. Will re-attempt to contact 8/5.

## 2015-01-27 NOTE — Assessment & Plan Note (Signed)
-   Will check TSH today - Currently takes 128mcg Synthroid daily

## 2015-01-27 NOTE — Assessment & Plan Note (Signed)
-   Recheck A1C - Refuses to take Metformin.  - Recommend taking Glimepiride as prescribed - Follow up in 3 months to recheck A1C with medication change

## 2015-01-27 NOTE — Telephone Encounter (Signed)
Attempted to contact concerning elevated TSH without response. Forwarding to Nursing for continued attempts at contact.    TSH elevated to TSH 51.132. Please ask if she is taking her Synthroid 153mcg as prescribed.  If she is not, then she will need to start taking her thyroid medication every day and we will need to recheck her thyroid level in 6 weeks. If she is taking it exactly as prescribed, then we will need to increase her dose of thyroid medication and then recheck her thyroid level in 6 weeks for further adjustments. Please let me know what she says and I will adjust her thyroid medication as indicated.

## 2015-01-30 NOTE — Telephone Encounter (Signed)
Called and LVM on number on chart asking her to return my call. Please see below for information to ask pt. Kendra Hall

## 2015-01-30 NOTE — Telephone Encounter (Signed)
Pt called, read message below. She states that some weeks she misses doses. She says that she will start taking it every day like she is supposed to. Also, she would like for someone to return her call to inform her of what the normal range is for this reading. She also would like to know her A1C result. Thank you, Fonda Kinder, ASA

## 2015-03-09 ENCOUNTER — Emergency Department (INDEPENDENT_AMBULATORY_CARE_PROVIDER_SITE_OTHER)
Admission: EM | Admit: 2015-03-09 | Discharge: 2015-03-09 | Disposition: A | Discharge status: Home / Self Care | Payer: 59 | Source: Home / Self Care | Attending: Family Medicine | Admitting: Family Medicine

## 2015-03-09 ENCOUNTER — Encounter (HOSPITAL_COMMUNITY): Payer: Self-pay | Admitting: *Deleted

## 2015-03-09 DIAGNOSIS — M5481 Occipital neuralgia: Secondary | ICD-10-CM

## 2015-03-09 MED ORDER — BACLOFEN 10 MG PO TABS: 10.0000 mg | ORAL_TABLET | Freq: Three times a day (TID) | ORAL | Status: DC

## 2015-03-09 MED ORDER — PROMETHAZINE HCL 25 MG PO TABS: 25.0000 mg | ORAL_TABLET | Freq: Four times a day (QID) | ORAL | Status: DC | PRN

## 2015-03-09 NOTE — ED Provider Notes (Signed)
CSN: [id number]     Arrival date & time 03/09/15  1941 History   First MD Initiated Contact with Patient 03/09/15 2113     Chief Complaint  Patient presents with  . Headache   (Consider location/radiation/quality/duration/timing/severity/associated sxs/prior Treatment) Patient is a 49 y.o. female presenting with headaches. The history is provided by the patient and a relative.  Headache Pain location:  L parietal and occipital Quality:  Stabbing Radiates to:  L neck Onset quality:  Gradual Duration:  2 weeks Timing:  Intermittent Progression:  Unchanged Chronicity:  New Similar to prior headaches: no (has migraines which she says are different)   Relieved by:  None tried Worsened by:  Nothing Ineffective treatments:  None tried Associated symptoms: nausea, neck pain and neck stiffness   Associated symptoms: no abdominal pain, no back pain, no dizziness, no drainage, no fever, no loss of balance, no numbness and no weakness     Past Medical History  Diagnosis Date  . Allergy   . Depression   . Diabetes mellitus   . Osteoarthritis     Facet hypertrophy with injections  . Multiple thyroid nodules   . Gastric ulcer   . Migraine   . Grave's disease    Past Surgical History  Procedure Laterality Date  . Axillary lymph node dissection  2001  . Tubal ligation  1995   Family History  Problem Relation Age of Onset  . Hypertension Mother   . Diabetes Mother   . Cancer Mother     rectal  . Heart disease Father 32    MI x5  . Hypertension Father   . Diabetes Father   . Cancer Sister 46    breast   Social History  Substance Use Topics  . Smoking status: Current Every Day Smoker -- 0.10 packs/day    Types: Cigarettes  . Smokeless tobacco: None  . Alcohol Use: Yes     Comment: once a year   OB History    No data available     Review of Systems  Constitutional: Negative.  Negative for fever.  HENT: Negative for postnasal drip.   Gastrointestinal: Positive for  nausea. Negative for abdominal pain.  Musculoskeletal: Positive for neck pain and neck stiffness. Negative for back pain.  Neurological: Positive for headaches. Negative for dizziness, weakness, numbness and loss of balance.    Allergies  Erca; Ibuprofen; and Yutopar  Home Medications   Prior to Admission medications   Medication Sig Start Date End Date Taking? Authorizing Provider  albuterol (PROAIR HFA) 108 (90 BASE) MCG/ACT inhaler Inhale 2 puffs into the lungs every 4 (four) hours as needed for wheezing. 01/25/15   Isabella N Rumley, DO  baclofen (LIORESAL) 10 MG tablet Take 1 tablet (10 mg total) by mouth 3 (three) times daily. For headache pains 03/09/15   Billy Fischer, MD  benzonatate (TESSALON) 100 MG capsule take 1 capsule by mouth twice a day for cough 01/25/15   [place] N Rumley, DO  Blood Glucose Monitoring Suppl (ONE TOUCH ULTRA SYSTEM KIT) W/DEVICE KIT 1 kit by Does not apply route once. 10/15/13   Coral Spikes, DO  cyclobenzaprine (FLEXERIL) 5 MG tablet Take 1 tablet (5 mg total) by mouth 3 (three) times daily as needed for muscle spasms. 10/24/14   Rosemarie Ax, MD  esomeprazole (NEXIUM) 40 MG capsule Take 1 capsule (40 mg total) by mouth daily. 01/25/15   [place] N Rumley, DO  glimepiride (AMARYL) 2 MG tablet Take 1  tablet (2 mg total) by mouth daily with breakfast. 01/25/15   Lorna Few, DO  glucose blood (ONE TOUCH ULTRA TEST) test strip Use as instructed 10/15/13   Coral Spikes, DO  levothyroxine (SYNTHROID, LEVOTHROID) 175 MCG tablet Take 1 tablet (175 mcg total) by mouth daily. 01/25/15   [place] N Rumley, DO  ONE TOUCH LANCETS MISC Use up to 6 times daily to test blood sugar. 10/15/13   Coral Spikes, DO  promethazine (PHENERGAN) 25 MG tablet Take 1 tablet (25 mg total) by mouth every 6 (six) hours as needed for nausea or vomiting. 03/09/15   Billy Fischer, MD  rizatriptan (MAXALT-MLT) 10 MG disintegrating tablet Take 1 tablet (10 mg total) by mouth as needed for migraine. May  repeat in 2 hours if needed 01/25/15   Lorna Few, DO   Meds Ordered and Administered this Visit  Medications - No data to display  BP 147/98 mmHg  Pulse 76  Temp(Src) 98.2 F (36.8 C) (Oral)  Resp 16  SpO2 100%  LMP 03/09/2015 No data found.   Physical Exam  Constitutional: She is oriented to person, place, and time. She appears well-developed and well-nourished. No distress.  HENT:  Head: Normocephalic.  Right Ear: External ear normal.  Left Ear: External ear normal.  Mouth/Throat: Oropharynx is clear and moist.  Scalp tenderness scattered.  Eyes: Pupils are equal, round, and reactive to light.  Neck: Muscular tenderness present. Decreased range of motion present. No erythema present. No Brudzinski's sign and no Kernig's sign noted.    Musculoskeletal: She exhibits tenderness.  Lymphadenopathy:    She has no cervical adenopathy.  Neurological: She is alert and oriented to person, place, and time.  Nursing note and vitals reviewed.   ED Course  Procedures (including critical care time)  Labs Review Labs Reviewed - No data to display  Imaging Review No results found.   Visual Acuity Review  Right Eye Distance:   Left Eye Distance:   Bilateral Distance:    Right Eye Near:   Left Eye Near:    Bilateral Near:         MDM   1. Occipital neuralgia of left side     rx for baclofen for occip neuralgia pain    Billy Fischer, MD 03/09/15 2140

## 2015-03-09 NOTE — Discharge Instructions (Signed)
Use medicine as needed and see your doctor if further problems °

## 2015-03-09 NOTE — ED Notes (Signed)
Pt  Reports symptoms of l  Sided headache        With pain  Nausea          / vomiting       Symptoms  Began  approx  2  Weeks  Agio             Symptoms  Not  releived  By   Tylenol /  Max  Strength

## 2015-03-15 ENCOUNTER — Encounter (HOSPITAL_COMMUNITY): Payer: Self-pay | Admitting: General Practice

## 2015-03-15 ENCOUNTER — Observation Stay (HOSPITAL_COMMUNITY)
Admission: AD | Admit: 2015-03-15 | Discharge: 2015-03-16 | Disposition: A | Discharge status: Home Health | Payer: 59 | Source: Ambulatory Visit | Attending: Family Medicine | Admitting: Family Medicine

## 2015-03-15 ENCOUNTER — Observation Stay (HOSPITAL_COMMUNITY): Payer: 59

## 2015-03-15 ENCOUNTER — Ambulatory Visit (INDEPENDENT_AMBULATORY_CARE_PROVIDER_SITE_OTHER): Payer: 59 | Admitting: Family Medicine

## 2015-03-15 ENCOUNTER — Encounter: Payer: Self-pay | Admitting: Family Medicine

## 2015-03-15 VITALS — BP 143/73 | HR 87 | Temp 98.4°F | Ht 64.0 in | Wt 197.0 lb

## 2015-03-15 DIAGNOSIS — M549 Dorsalgia, unspecified: Secondary | ICD-10-CM | POA: Insufficient documentation

## 2015-03-15 DIAGNOSIS — G8929 Other chronic pain: Secondary | ICD-10-CM | POA: Insufficient documentation

## 2015-03-15 DIAGNOSIS — R531 Weakness: Secondary | ICD-10-CM | POA: Diagnosis not present

## 2015-03-15 DIAGNOSIS — E039 Hypothyroidism, unspecified: Secondary | ICD-10-CM | POA: Diagnosis not present

## 2015-03-15 DIAGNOSIS — F17210 Nicotine dependence, cigarettes, uncomplicated: Secondary | ICD-10-CM | POA: Diagnosis not present

## 2015-03-15 DIAGNOSIS — R51 Headache: Secondary | ICD-10-CM | POA: Insufficient documentation

## 2015-03-15 DIAGNOSIS — E119 Type 2 diabetes mellitus without complications: Secondary | ICD-10-CM | POA: Diagnosis not present

## 2015-03-15 DIAGNOSIS — E0500 Thyrotoxicosis with diffuse goiter without thyrotoxic crisis or storm: Secondary | ICD-10-CM | POA: Insufficient documentation

## 2015-03-15 DIAGNOSIS — D649 Anemia, unspecified: Secondary | ICD-10-CM | POA: Diagnosis not present

## 2015-03-15 DIAGNOSIS — Z9114 Patient's other noncompliance with medication regimen: Secondary | ICD-10-CM | POA: Diagnosis not present

## 2015-03-15 DIAGNOSIS — R4781 Slurred speech: Secondary | ICD-10-CM | POA: Diagnosis not present

## 2015-03-15 DIAGNOSIS — R269 Unspecified abnormalities of gait and mobility: Secondary | ICD-10-CM | POA: Diagnosis not present

## 2015-03-15 DIAGNOSIS — R29810 Facial weakness: Secondary | ICD-10-CM | POA: Diagnosis not present

## 2015-03-15 DIAGNOSIS — G43409 Hemiplegic migraine, not intractable, without status migrainosus: Secondary | ICD-10-CM | POA: Diagnosis not present

## 2015-03-15 DIAGNOSIS — Z923 Personal history of irradiation: Secondary | ICD-10-CM | POA: Diagnosis not present

## 2015-03-15 DIAGNOSIS — I10 Essential (primary) hypertension: Secondary | ICD-10-CM | POA: Diagnosis not present

## 2015-03-15 DIAGNOSIS — E038 Other specified hypothyroidism: Secondary | ICD-10-CM

## 2015-03-15 DIAGNOSIS — F17200 Nicotine dependence, unspecified, uncomplicated: Secondary | ICD-10-CM

## 2015-03-15 DIAGNOSIS — Z720 Tobacco use: Secondary | ICD-10-CM | POA: Diagnosis not present

## 2015-03-15 DIAGNOSIS — G459 Transient cerebral ischemic attack, unspecified: Secondary | ICD-10-CM | POA: Insufficient documentation

## 2015-03-15 DIAGNOSIS — E890 Postprocedural hypothyroidism: Secondary | ICD-10-CM | POA: Diagnosis not present

## 2015-03-15 HISTORY — DX: Unspecified asthma, uncomplicated: J45.909

## 2015-03-15 HISTORY — DX: Anxiety disorder, unspecified: F41.9

## 2015-03-15 LAB — CBC
HCT: 30.2 % — ABNORMAL LOW (ref 36.0–46.0)
Hemoglobin: 9.4 g/dL — ABNORMAL LOW (ref 12.0–15.0)
MCH: 22.7 pg — ABNORMAL LOW (ref 26.0–34.0)
MCHC: 31.1 g/dL (ref 30.0–36.0)
MCV: 72.9 fL — ABNORMAL LOW (ref 78.0–100.0)
Platelets: 374 10*3/uL (ref 150–400)
RBC: 4.14 MIL/uL (ref 3.87–5.11)
RDW: 16.8 % — ABNORMAL HIGH (ref 11.5–15.5)
WBC: 7 10*3/uL (ref 4.0–10.5)

## 2015-03-15 LAB — COMPREHENSIVE METABOLIC PANEL
ALT: 9 U/L — ABNORMAL LOW (ref 14–54)
AST: 15 U/L (ref 15–41)
Albumin: 3.3 g/dL — ABNORMAL LOW (ref 3.5–5.0)
Alkaline Phosphatase: 64 U/L (ref 38–126)
Anion gap: 8 (ref 5–15)
BUN: 11 mg/dL (ref 6–20)
CO2: 26 mmol/L (ref 22–32)
Calcium: 9.1 mg/dL (ref 8.9–10.3)
Chloride: 105 mmol/L (ref 101–111)
Creatinine, Ser: 0.88 mg/dL (ref 0.44–1.00)
GFR calc Af Amer: >60 mL/min (ref >60)
GFR calc non Af Amer: >60 mL/min (ref >60)
Glucose, Bld: 117 mg/dL — ABNORMAL HIGH (ref 65–99)
Potassium: 4.2 mmol/L (ref 3.5–5.1)
Sodium: 139 mmol/L (ref 135–145)
Total Bilirubin: 0.5 mg/dL (ref 0.3–1.2)
Total Protein: 6.7 g/dL (ref 6.5–8.1)

## 2015-03-15 LAB — GLUCOSE, CAPILLARY: Glucose-Capillary: 79 mg/dL (ref 65–99)

## 2015-03-15 LAB — TSH: TSH: 2.864 u[IU]/mL (ref 0.350–4.500)

## 2015-03-15 MED ORDER — SENNOSIDES-DOCUSATE SODIUM 8.6-50 MG PO TABS
1.0000 | ORAL_TABLET | Freq: Every evening | ORAL | Status: DC | PRN
  Administered 2015-03-16: 1 via ORAL
  Filled 2015-03-15: qty 1

## 2015-03-15 MED ORDER — ACETAMINOPHEN 325 MG PO TABS
650.0000 mg | ORAL_TABLET | Freq: Four times a day (QID) | ORAL | Status: DC | PRN
  Administered 2015-03-15 – 2015-03-16 (×2): 650 mg via ORAL
  Filled 2015-03-15 (×3): qty 2

## 2015-03-15 MED ORDER — TRAMADOL HCL 50 MG PO TABS
50.0000 mg | ORAL_TABLET | Freq: Once | ORAL | Status: AC
  Administered 2015-03-15: 50 mg via ORAL
  Filled 2015-03-15: qty 1

## 2015-03-15 MED ORDER — ALBUTEROL SULFATE HFA 108 (90 BASE) MCG/ACT IN AERS: 2.0000 | INHALATION_SPRAY | RESPIRATORY_TRACT | Status: DC | PRN

## 2015-03-15 MED ORDER — PANTOPRAZOLE SODIUM 40 MG PO TBEC
40.0000 mg | DELAYED_RELEASE_TABLET | Freq: Every day | ORAL | Status: DC
  Administered 2015-03-15 – 2015-03-16 (×2): 40 mg via ORAL
  Filled 2015-03-15 (×2): qty 1

## 2015-03-15 MED ORDER — STROKE: EARLY STAGES OF RECOVERY BOOK
Freq: Once | Status: AC
  Administered 2015-03-15: 18:00:00

## 2015-03-15 MED ORDER — INSULIN ASPART 100 UNIT/ML ~~LOC~~ SOLN
0.0000 [IU] | Freq: Three times a day (TID) | SUBCUTANEOUS | Status: DC
  Administered 2015-03-16: 2 [IU] via SUBCUTANEOUS
  Administered 2015-03-16: 3 [IU] via SUBCUTANEOUS

## 2015-03-15 MED ORDER — LEVOTHYROXINE SODIUM 100 MCG PO TABS
175.0000 ug | ORAL_TABLET | Freq: Every day | ORAL | Status: DC
  Administered 2015-03-15: 175 ug via ORAL
  Filled 2015-03-15 (×3): qty 1

## 2015-03-15 MED ORDER — CYCLOBENZAPRINE HCL 10 MG PO TABS
5.0000 mg | ORAL_TABLET | Freq: Three times a day (TID) | ORAL | Status: DC | PRN
  Administered 2015-03-15 – 2015-03-16 (×2): 5 mg via ORAL
  Filled 2015-03-15 (×2): qty 1

## 2015-03-15 NOTE — Assessment & Plan Note (Signed)
-   Smokes 5 cigarettes/day - Counsel on smoking

## 2015-03-15 NOTE — Progress Notes (Signed)
Subjective:     Patient ID: Kendra Hall, female   DOB: 1966/05/17, 49 y.o.   MRN: [id number]  HPI [name] is a 49yo female presenting today for left sided headache and back pain. - Notes left sided headache x2 weeks with radiation behind left eye. Denies eye pain. - Has been using Tylenol and Hydrocodone (1/2 tablet), which helps for approximately 1 hr - Pain has been getting worse and more constant - Describes pain as sharp, like "lightening bolt" with certain movements but some aspect present at all times - Some improvement with Flexeril, which she had left over from prior encounter - Went to Urgent Care and was diagnosed with occipital neuralgia. Prescribed Baclofen, which she didn't fill since she already had Flexeril. - Also reports left sided heaviness and "strange" sensation, left lower extremity weakness, occasional facial asymmetry, and occasional slurred speech. - Notes acute flare of chronic low back pain since 10pm last night  - Denies history of trauma  - Denies radiation  - Denies urinary or fecal incontinence, denies saddle anesthesia  - Pain worse over left side, denies midline tenderness  - Reports it is hard to move or function due to severity of pain - Also reports constipation - Smokes 5 cigarettes/day - Notes history of significantly elevated TSH in August. Had not been taking thyroid medication as prescribed at that time, so recommended restarting as prescribed. Reports feeling better in general since restarting her thyroid medication. - Denies history of hypertension, although documented in chart - Stopped Metformin on her own after experiencing arm "twitch"...resolved once discontinued.    Review of Systems Per HPI    Objective:   Physical Exam  Constitutional: She is oriented to person, place, and time. She appears well-developed and well-nourished.  Tearful  HENT:  Head: Normocephalic and atraumatic.  No tenderness with palpation of left scalp  Eyes:  EOM are normal. Pupils are equal, round, and reactive to light.  Reports left eye weakness with exam but none observed directly  Neck: Normal range of motion. Neck supple.  No tenderness over neck or trapezius  Cardiovascular: Normal rate and regular rhythm.  Exam reveals no gallop and no friction rub.   No murmur heard. Pulmonary/Chest: Effort normal. No respiratory distress. She has no wheezes. She has no rales.  Abdominal: Soft. She exhibits no distension. There is no tenderness.  Musculoskeletal:  Muscle strength 5/5 in upper extremity, 4/5 in left lower extremity, 5/5 in right lower extremity. No midline spinal tenderness. Tenderness with tight paraspinal muscles over left lumbar spine. Difficulty ambulating.  Neurological: She is alert and oriented to person, place, and time. No cranial nerve deficit.  Normal sensation. No facial asymmetry noted. No slurred speech noted.  Skin: Skin is warm and dry. No rash noted.  Psychiatric: She has a normal mood and affect. Her behavior is normal.  Appropriately tearful.      Assessment:     TIA (transient ischemic attack) - Direct admission to family medicine teaching service for TIA workup given headache, intermittent slurred speech, intermittent facial asymmetry, and left sided lower extremity weakness noted on exam. Admitting team contacted. Temporary orders placed. - Recommend stroke workup to include cholesterol, carotid dopplers, echo, head imaging - Recommend initiation of a statin  Hypothyroidism following radioiodine therapy - TSH noted to elevated at last office visit. Restarted Synthroid at that visit with improvement in general symptoms. - Recheck TSH to ensure improvement with using medication as prescribed. If TSH still elevated, adjust synthroid  accordingly.  TOBACCO USER - Smokes 5 cigarettes/day - Counsel on smoking

## 2015-03-15 NOTE — Assessment & Plan Note (Signed)
-   TSH noted to elevated at last office visit. Restarted Synthroid at that visit with improvement in general symptoms. - Recheck TSH to ensure improvement with using medication as prescribed. If TSH still elevated, adjust synthroid accordingly.

## 2015-03-15 NOTE — Patient Instructions (Signed)
Transient Ischemic Attack  A transient ischemic attack (TIA) is a "warning stroke" that causes stroke-like symptoms. Unlike a stroke, a TIA does not cause permanent damage to the brain. The symptoms of a TIA can happen very fast and do not last long. It is important to know the symptoms of a TIA and what to do. This can help prevent a major stroke or death.  CAUSES   · A TIA is caused by a temporary blockage in an artery in the brain or neck (carotid artery). The blockage does not allow the brain to get the blood supply it needs and can cause different symptoms. The blockage can be caused by either:  ¨ A blood clot.  ¨ Fatty buildup (plaque) in a neck or brain artery.  RISK FACTORS  · High blood pressure (hypertension).  · High cholesterol.  · Diabetes mellitus.  · Heart disease.  · The build up of plaque in the blood vessels (peripheral artery disease or atherosclerosis).  · The build up of plaque in the blood vessels providing blood and oxygen to the brain (carotid artery stenosis).  · An abnormal heart rhythm (atrial fibrillation).  · Obesity.  · Smoking.  · Taking oral contraceptives (especially in combination with smoking).  · Physical inactivity.  · A diet high in fats, salt (sodium), and calories.  · Alcohol use.  · Use of illegal drugs (especially cocaine and methamphetamine).  · Being female.  · Being African American.  · Being over the age of 55.  · Family history of stroke.  · Previous history of blood clots, stroke, TIA, or heart attack.  · Sickle cell disease.  SYMPTOMS   TIA symptoms are the same as a stroke but are temporary. These symptoms usually develop suddenly, or may be newly present upon awakening from sleep:  · Sudden weakness or numbness of the face, arm, or leg, especially on one side of the body.  · Sudden trouble walking or difficulty moving arms or legs.  · Sudden confusion.  · Sudden personality changes.  · Trouble speaking (aphasia) or understanding.  · Difficulty swallowing.  · Sudden  trouble seeing in one or both eyes.  · Double vision.  · Dizziness.  · Loss of balance or coordination.  · Sudden severe headache with no known cause.  · Trouble reading or writing.  · Loss of bowel or bladder control.  · Loss of consciousness.  DIAGNOSIS   Your caregiver may be able to determine the presence or absence of a TIA based on your symptoms, history, and physical exam. Computed tomography (CT scan) of the brain is usually performed to help identify a TIA. Other tests may be done to diagnose a TIA. These tests may include:  · Electrocardiography.  · Continuous heart monitoring.  · Echocardiography.  · Carotid ultrasonography.  · Magnetic resonance imaging (MRI).  · A scan of the brain circulation.  · Blood tests.  PREVENTION   The risk of a TIA can be decreased by appropriately treating high blood pressure, high cholesterol, diabetes, heart disease, and obesity and by quitting smoking, limiting alcohol, and staying physically active.  TREATMENT   Time is of the essence. Since the symptoms of TIA are the same as a stroke, it is important to seek treatment as soon as possible because you may need a medicine to dissolve the clot (thrombolytic) that cannot be given if too much time has passed. Treatment options vary. Treatment options may include rest, oxygen, intravenous (IV) fluids,   and medicines to thin the blood (anticoagulants). Medicines and diet may be used to address diabetes, high blood pressure, and other risk factors. Measures will be taken to prevent short-term and long-term complications, including infection from breathing foreign material into the lungs (aspiration pneumonia), blood clots in the legs, and falls. Treatment options include procedures to either remove plaque in the carotid arteries or dilate carotid arteries that have narrowed due to plaque. Those procedures are:  · Carotid endarterectomy.  · Carotid angioplasty and stenting.  HOME CARE INSTRUCTIONS   · Take all medicines prescribed  by your caregiver. Follow the directions carefully. Medicines may be used to control risk factors for a stroke. Be sure you understand all your medicine instructions.  · You may be told to take aspirin or the anticoagulant warfarin. Warfarin needs to be taken exactly as instructed.  ¨ Taking too much or too little warfarin is dangerous. Too much warfarin increases the risk of bleeding. Too little warfarin continues to allow the risk for blood clots. While taking warfarin, you will need to have regular blood tests to measure your blood clotting time. A PT blood test measures how long it takes for blood to clot. Your PT is used to calculate another value called an INR. Your PT and INR help your caregiver to adjust your dose of warfarin. The dose can change for many reasons. It is critically important that you take warfarin exactly as prescribed.  ¨ Many foods, especially foods high in vitamin K can interfere with warfarin and affect the PT and INR. Foods high in vitamin K include spinach, kale, broccoli, cabbage, collard and turnip greens, brussels sprouts, peas, cauliflower, seaweed, and parsley as well as beef and pork liver, green tea, and soybean oil. You should eat a consistent amount of foods high in vitamin K. Avoid major changes in your diet, or notify your caregiver before changing your diet. Arrange a visit with a dietitian to answer your questions.  ¨ Many medicines can interfere with warfarin and affect the PT and INR. You must tell your caregiver about any and all medicines you take, this includes all vitamins and supplements. Be especially cautious with aspirin and anti-inflammatory medicines. Do not take or discontinue any prescribed or over-the-counter medicine except on the advice of your caregiver or pharmacist.  ¨ Warfarin can have side effects, such as excessive bruising or bleeding. You will need to hold pressure over cuts for longer than usual. Your caregiver or pharmacist will discuss other  potential side effects.  ¨ Avoid sports or activities that may cause injury or bleeding.  ¨ Be mindful when shaving, flossing your teeth, or handling sharp objects.  ¨ Alcohol can change the body's ability to handle warfarin. It is best to avoid alcoholic drinks or consume only very small amounts while taking warfarin. Notify your caregiver if you change your alcohol intake.  ¨ Notify your dentist or other caregivers before procedures.  · Eat a diet that includes 5 or more servings of fruits and vegetables each day. This may reduce the risk of stroke. Certain diets may be prescribed to address high blood pressure, high cholesterol, diabetes, or obesity.  ¨ A low-sodium, low-saturated fat, low-trans fat, low-cholesterol diet is recommended to manage high blood pressure.  ¨ A low-saturated fat, low-trans fat, low-cholesterol, and high-fiber diet may control cholesterol levels.  ¨ A controlled-carbohydrate, controlled-sugar diet is recommended to manage diabetes.  ¨ A reduced-calorie, low-sodium, low-saturated fat, low-trans fat, low-cholesterol diet is recommended to manage obesity.  ·   Maintain a healthy weight.  · Stay physically active. It is recommended that you get at least 30 minutes of activity on most or all days.  · Do not smoke.  · Limit alcohol use even if you are not taking warfarin. Moderate alcohol use is considered to be:  ¨ No more than 2 drinks each day for men.  ¨ No more than 1 drink each day for nonpregnant women.  · Stop drug abuse.  · Home safety. A safe home environment is important to reduce the risk of falls. Your caregiver may arrange for specialists to evaluate your home. Having grab bars in the bedroom and bathroom is often important. Your caregiver may arrange for equipment to be used at home, such as raised toilets and a seat for the shower.  · Follow all instructions for follow-up with your caregiver. This is very important. This includes any referrals and lab tests. Proper follow up can  prevent a stroke or another TIA from occurring.  SEEK MEDICAL CARE IF:  · You have personality changes.  · You have difficulty swallowing.  · You are seeing double.  · You have dizziness.  · You have a fever.  · You have skin breakdown.  SEEK IMMEDIATE MEDICAL CARE IF:   Any of these symptoms may represent a serious problem that is an emergency. Do not wait to see if the symptoms will go away. Get medical help right away. Call your local emergency services (911 in [place] Do not drive yourself to the hospital.  · You have sudden weakness or numbness of the face, arm, or leg, especially on one side of the body.  · You have sudden trouble walking or difficulty moving arms or legs.  · You have sudden confusion.  · You have trouble speaking (aphasia) or understanding.  · You have sudden trouble seeing in one or both eyes.  · You have a loss of balance or coordination.  · You have a sudden, severe headache with no known cause.  · You have new chest pain or an irregular heartbeat.  · You have a partial or total loss of consciousness.  MAKE SURE YOU:   · Understand these instructions.  · Will watch your condition.  · Will get help right away if you are not doing well or get worse.  Document Released: 03/20/2005 Document Revised: 06/15/2013 Document Reviewed: 09/15/2013  [employer]® Patient Information ©2015 [employer]. This information is not intended to replace advice given to you by your health care provider. Make sure you discuss any questions you have with your health care provider.

## 2015-03-15 NOTE — Assessment & Plan Note (Addendum)
-   Direct admission to family medicine teaching service for TIA workup given headache, intermittent slurred speech, intermittent facial asymmetry, and left sided lower extremity weakness noted on exam. Admitting team contacted. Temporary orders placed. - Recommend stroke workup to include cholesterol, carotid dopplers, echo, head imaging - Recommend initiation of a statin

## 2015-03-15 NOTE — H&P (Signed)
[facility] Admission History and Physical Service Pager: [phone]  Patient name: Kendra Hall Medical record number: [id number] Date of birth: January 03, 1966 Age: 49 y.o. Gender: female  Primary Care Provider: Junie Panning, DO Consultants: Consider neurology  Code Status: FULL per discussion on admission  Chief Complaint: Headache, back pain  Assessment and Plan: Kendra Hall is a 49 y.o. female presenting with a headache and back pain. PMH is significant for T2DM, tobacco abuse, migraines, lower back pain, Graves disease s/p RI therapy now hypothyroidism,   Left sided weakness: Patient's symptoms sporadic. It does not sound like the weakness is originating from her lower back. Given the patient's h/o hypothyroidism and medication non-compliance, would like to rule out hypothyroidism. This could be atypical migraines given the headache was the inciting factor- the patient is supposed to be on prophylaxis with Topamax however has not started it. Will also proceed with a stroke work up, patient is not in the window of tPA.  - continue to monitor on telemetry  - MRI/MRA of brain in progress (prior to CT that was ordered therefore will discontinue CT) -  Will get TSH, CBC, and CMET  - Consider consulting neurology if abnormalities on CT  - risk stratification labs - will get an echocardiogram if MRI is abnormal  - PT/OT/SLP - neuro checks q 2hrs x 12hrs, then 4 hrs - Holding off on Triptans in the setting concerns for TIA or stroke  Back pain: Has a h/o chronic back pain.  Appears more paraspinal in nature. No tenderness to palpation in the midline over the spinous processes.  - Flexeril 5mg  TID prn spasms   Diabetes:  Last A1c was 6.76 on 01/25/15 - Hold home Amaryl - Sensitive SSI  - Monitor CBGs, consider discontinuing if CBGs within normal limits  Tobacco abuse: Smokes 5 cigs/day. Not interested in a nicotine patch currently.   Hypothyroidism s/p  radioiodine treatment for Grave's disease: Last TSH was significantly elevated at 51, however per PCP, she was not taking her medication regularly. - obtain a TSH today  - continue home levothyroxine for now  FEN/GI: NPO until after swallow screen, then heart healthy Prophylaxis: SCDs for now until after CT to r/o hemorrhagic stroke   Disposition: continue to monitor on telemetry pending work up   History of Present Illness:  Kendra Hall is a 49 y.o. female presenting with left sided headache, back pain, and left sided  Constipated last week, took OTC mag citrate and a warm bath. This produced a bowel movement, but she became nauseous and vomited afterward. Nausea has come and gone since then.  HA came on 2 weeks ago, L parietal area, pt feels eyelid is drooping on L eye. Feels pressure behind her L eye. Has tried flexeril, 1/2 tab hydrocodone, 500mg  tylenol, maxalt. Friday 9/16, she went to urgent care for HA and concern about L eye watering and increased pressure behind L eye. Told she had "arthritis in neck". Subjective decrease in L eye vision with occasional photophobia and phonophobia.  Unable to report when left sided weakness began, thinks it began with onset of headache and noted it was more pronounced today. Notices weakness in L leg. Tingling in fingertips. Tingling in L leg today like pins and needles and some other sensation that she cannot describe  but has since resolved.    Last night around 10pm, she had a sudden onset of back pain that is mostly in the left side. Could not  get comfortable lying down last night. Unable to do ADLs with R hand, despite being R handed, because her back pain was so bad. Did not try anything for her back pain. Constipation that resolved with Mag Citrate. No urinary or bowel incontinence.  No dysuria, urinary urgency, or urinary frequency. No suprapubic pain.   She also notes at work for the last 2 weeks, she reports having trouble with expressive  language stating coworkers asked her "what's wrong?" Coworkers have been concerned about her.   She was sent as a direct admit for stroke workup.   Right now, she denies pins and needles in L leg. Has "a sensation" in her left leg that she cannot describe.  Has pins and needles sensation in first two digits on R hand, and has continued lower back pain.   Review Of Systems: Per HPI with the following additions: none  Otherwise 12 point review of systems was performed and was unremarkable.  Patient Active Problem List   Diagnosis Date Noted  . TIA (transient ischemic attack) 03/15/2015  . Back pain, acute 03/15/2015  . Neck pain 10/24/2014  . Right knee pain 08/15/2014  . Mild intermittent asthma 02/28/2012  . Anxiety disorder 02/07/2012  . Hypertension 01/07/2012  . Hypothyroidism following radioiodine therapy 08/28/2011  . Grave's disease 05/07/2011  . Family hx-breast malignancy 11/20/2010  . Chronic migraine without aura 05/04/2010  . TOBACCO USER 03/11/2009  . LOW BACK PAIN, CHRONIC 01/09/2009  . OBESITY, UNSPECIFIED 09/27/2008  . Diabetes 09/22/2008  . Mood disorder 09/22/2008   Past Medical History: Past Medical History  Diagnosis Date  . Allergy   . Depression   . Diabetes mellitus   . Osteoarthritis     Facet hypertrophy with injections  . Multiple thyroid nodules   . Gastric ulcer   . Migraine   . Grave's disease   . Anxiety   . Asthma    Past Surgical History: Past Surgical History  Procedure Laterality Date  . Axillary lymph node dissection  2001  . Tubal ligation  1995   Social History: Social History  Substance Use Topics  . Smoking status: Current Every Day Smoker -- 0.10 packs/day    Types: Cigarettes  . Smokeless tobacco: Never Used  . 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  Please also refer to relevant sections  of EMR.  Family History: Family History  Problem Relation Age of Onset  . Hypertension Mother   . Diabetes Mother   . Cancer Mother     rectal  . Heart disease Father 68    MI x5  . Hypertension Father   . Diabetes Father   . Cancer Sister 46    breast   Allergies and Medications: Allergies  Allergen Reactions  . Erca [Ergotamine-Caffeine]   . Ibuprofen Other (See Comments)    Patient has ulcer  . Yutopar [Ritodrine]    No current facility-administered medications on file prior to encounter.   Current Outpatient Prescriptions on File Prior to Encounter  Medication Sig Dispense Refill  . albuterol (PROAIR HFA) 108 (90 BASE) MCG/ACT inhaler Inhale 2 puffs into the lungs every 4 (four) hours as needed for wheezing. 1 Inhaler 5  . cyclobenzaprine (FLEXERIL) 5 MG tablet Take 1 tablet (5 mg total) by mouth 3 (three) times daily as needed for muscle spasms.  30 tablet 1  . esomeprazole (NEXIUM) 40 MG capsule Take 1 capsule (40 mg total) by mouth daily. 30 capsule 6  . levothyroxine (SYNTHROID, LEVOTHROID) 175 MCG tablet Take 1 tablet (175 mcg total) by mouth daily. (Patient taking differently: Take 175 mcg by mouth at bedtime. ) 30 tablet 1  . rizatriptan (MAXALT-MLT) 10 MG disintegrating tablet Take 1 tablet (10 mg total) by mouth as needed for migraine. May repeat in 2 hours if needed 15 tablet 1  . baclofen (LIORESAL) 10 MG tablet Take 1 tablet (10 mg total) by mouth 3 (three) times daily. For headache pains (Patient not taking: Reported on 03/15/2015) 30 each 0  . benzonatate (TESSALON) 100 MG capsule take 1 capsule by mouth twice a day for cough (Patient not taking: Reported on 03/15/2015) 30 capsule 0  . glimepiride (AMARYL) 2 MG tablet Take 1 tablet (2 mg total) by mouth daily with breakfast. (Patient not taking: Reported on 03/15/2015) 90 tablet 3  . promethazine (PHENERGAN) 25 MG tablet Take 1 tablet (25 mg total) by mouth every 6 (six) hours as needed for nausea or vomiting.  (Patient not taking: Reported on 03/15/2015) 10 tablet 0    Objective: BP 146/76 mmHg  Pulse 86  Temp(Src) 98.4 F (36.9 C) (Oral)  Resp 18  SpO2 100%  LMP 03/09/2015 Exam: General: Lying in bed watching TV . Non-toxic Eyes: Conjunctivae non-injected. No proptosis ENTM: Moist mucous membranes. Oropharynx clear. No nasal discharge.  Neck: Supple, no LAD  Cardiovascular: RRR. No murmurs, rubs, or gallops noted. No pitting edema noted. Respiratory: No increased WOB. CTAB without wheezing, rhonchi, or crackles noted. Abdomen: +BS, soft, non-distended, non-tender.  MSK: Normal bulk and tone. No gross deformities noted. TTP over the left lumbar paraspinal muscles with tension noted. No midline tenderness of the back. Skin: No rashes noted  Neuro: A&O x4. Speech clear. PERRL EOMI, Uvula and tongue midline. Facial movements symmetric. 5/5 strength in the upper extremities bilaterally. 4+/5 strength in the lower extremity on the L, 5/5 on the R. Sensation intact bilaterally. Normal DTRs.  Psych:  Appropriate mood and affect.    Labs and Imaging: None    Archie Patten, MD 03/15/2015, 7:13 PM PGY-2, [facility] pager: [phone], text pages welcome

## 2015-03-16 DIAGNOSIS — E119 Type 2 diabetes mellitus without complications: Secondary | ICD-10-CM | POA: Diagnosis not present

## 2015-03-16 DIAGNOSIS — E039 Hypothyroidism, unspecified: Secondary | ICD-10-CM | POA: Insufficient documentation

## 2015-03-16 DIAGNOSIS — R531 Weakness: Secondary | ICD-10-CM | POA: Diagnosis not present

## 2015-03-16 DIAGNOSIS — R51 Headache: Secondary | ICD-10-CM | POA: Diagnosis not present

## 2015-03-16 DIAGNOSIS — G43409 Hemiplegic migraine, not intractable, without status migrainosus: Secondary | ICD-10-CM | POA: Diagnosis not present

## 2015-03-16 DIAGNOSIS — M549 Dorsalgia, unspecified: Secondary | ICD-10-CM | POA: Diagnosis not present

## 2015-03-16 DIAGNOSIS — R4781 Slurred speech: Secondary | ICD-10-CM | POA: Insufficient documentation

## 2015-03-16 DIAGNOSIS — G8929 Other chronic pain: Secondary | ICD-10-CM | POA: Diagnosis not present

## 2015-03-16 DIAGNOSIS — E038 Other specified hypothyroidism: Secondary | ICD-10-CM | POA: Insufficient documentation

## 2015-03-16 LAB — BASIC METABOLIC PANEL
Anion gap: 7 (ref 5–15)
BUN: 14 mg/dL (ref 6–20)
CO2: 27 mmol/L (ref 22–32)
Calcium: 8.4 mg/dL — ABNORMAL LOW (ref 8.9–10.3)
Chloride: 100 mmol/L — ABNORMAL LOW (ref 101–111)
Creatinine, Ser: 1.26 mg/dL — ABNORMAL HIGH (ref 0.44–1.00)
GFR calc Af Amer: 57 mL/min — ABNORMAL LOW (ref >60)
GFR calc non Af Amer: 49 mL/min — ABNORMAL LOW (ref >60)
Glucose, Bld: 165 mg/dL — ABNORMAL HIGH (ref 65–99)
Potassium: 3.7 mmol/L (ref 3.5–5.1)
Sodium: 134 mmol/L — ABNORMAL LOW (ref 135–145)

## 2015-03-16 LAB — LIPID PANEL
Cholesterol: 118 mg/dL (ref 0–200)
HDL: 37 mg/dL — ABNORMAL LOW (ref >40)
LDL Cholesterol: 58 mg/dL (ref 0–99)
Total CHOL/HDL Ratio: 3.2 RATIO
Triglycerides: 113 mg/dL (ref <150)
VLDL: 23 mg/dL (ref 0–40)

## 2015-03-16 LAB — CBC
HCT: 28.8 % — ABNORMAL LOW (ref 36.0–46.0)
Hemoglobin: 9.1 g/dL — ABNORMAL LOW (ref 12.0–15.0)
MCH: 22.8 pg — ABNORMAL LOW (ref 26.0–34.0)
MCHC: 31.6 g/dL (ref 30.0–36.0)
MCV: 72 fL — ABNORMAL LOW (ref 78.0–100.0)
Platelets: 383 10*3/uL (ref 150–400)
RBC: 4 MIL/uL (ref 3.87–5.11)
RDW: 16.7 % — ABNORMAL HIGH (ref 11.5–15.5)
WBC: 7.3 10*3/uL (ref 4.0–10.5)

## 2015-03-16 LAB — GLUCOSE, CAPILLARY
Glucose-Capillary: 179 mg/dL — ABNORMAL HIGH (ref 65–99)
Glucose-Capillary: 196 mg/dL — ABNORMAL HIGH (ref 65–99)
Glucose-Capillary: 243 mg/dL — ABNORMAL HIGH (ref 65–99)

## 2015-03-16 LAB — IRON AND TIBC
Iron: 16 ug/dL — ABNORMAL LOW (ref 28–170)
Saturation Ratios: 4 % — ABNORMAL LOW (ref 10.4–31.8)
TIBC: 391 ug/dL (ref 250–450)
UIBC: 375 ug/dL

## 2015-03-16 LAB — FERRITIN: Ferritin: 4 ng/mL — ABNORMAL LOW (ref 11–307)

## 2015-03-16 MED ORDER — IBUPROFEN 400 MG PO TABS: 400.0000 mg | ORAL_TABLET | Freq: Four times a day (QID) | ORAL | Status: DC | PRN

## 2015-03-16 MED ORDER — DEXAMETHASONE 2 MG PO TABS: 10.0000 mg | ORAL_TABLET | Freq: Every day | ORAL | Status: DC

## 2015-03-16 MED ORDER — PANTOPRAZOLE SODIUM 40 MG IV SOLR
40.0000 mg | Freq: Two times a day (BID) | INTRAVENOUS | Status: DC
Start: 2015-03-16 — End: 2015-03-16

## 2015-03-16 MED ORDER — TOPIRAMATE 25 MG PO TABS: 25.0000 mg | ORAL_TABLET | Freq: Every day | ORAL | Status: DC

## 2015-03-16 MED ORDER — DIPHENHYDRAMINE HCL 50 MG/ML IJ SOLN
25.0000 mg | Freq: Four times a day (QID) | INTRAMUSCULAR | Status: DC
  Administered 2015-03-16 (×2): 25 mg via INTRAVENOUS
  Filled 2015-03-16 (×2): qty 1

## 2015-03-16 MED ORDER — DEXAMETHASONE SODIUM PHOSPHATE 10 MG/ML IJ SOLN
10.0000 mg | Freq: Every day | INTRAMUSCULAR | Status: DC
  Administered 2015-03-16: 10 mg via INTRAVENOUS
  Filled 2015-03-16: qty 1

## 2015-03-16 MED ORDER — PROCHLORPERAZINE MALEATE 10 MG PO TABS: 10.0000 mg | ORAL_TABLET | Freq: Four times a day (QID) | ORAL | Status: DC | PRN

## 2015-03-16 MED ORDER — SENNOSIDES-DOCUSATE SODIUM 8.6-50 MG PO TABS: 1.0000 | ORAL_TABLET | Freq: Two times a day (BID) | ORAL | Status: DC

## 2015-03-16 MED ORDER — PANTOPRAZOLE SODIUM 40 MG IV SOLR: 40.0000 mg | Freq: Two times a day (BID) | INTRAVENOUS | Status: DC

## 2015-03-16 MED ORDER — KETOROLAC TROMETHAMINE 15 MG/ML IJ SOLN
15.0000 mg | Freq: Four times a day (QID) | INTRAMUSCULAR | Status: DC
  Administered 2015-03-16 (×2): 15 mg via INTRAVENOUS
  Filled 2015-03-16 (×2): qty 1

## 2015-03-16 MED ORDER — PROCHLORPERAZINE EDISYLATE 5 MG/ML IJ SOLN
10.0000 mg | Freq: Four times a day (QID) | INTRAMUSCULAR | Status: DC
  Administered 2015-03-16 (×2): 10 mg via INTRAVENOUS
  Filled 2015-03-16 (×5): qty 2

## 2015-03-16 MED ORDER — PANTOPRAZOLE SODIUM 40 MG PO TBEC: 40.0000 mg | DELAYED_RELEASE_TABLET | Freq: Two times a day (BID) | ORAL | Status: DC

## 2015-03-16 MED ORDER — ENOXAPARIN SODIUM 40 MG/0.4ML ~~LOC~~ SOLN
40.0000 mg | SUBCUTANEOUS | Status: DC
  Administered 2015-03-16: 40 mg via SUBCUTANEOUS
  Filled 2015-03-16: qty 0.4

## 2015-03-16 MED ORDER — DIPHENHYDRAMINE HCL 50 MG PO TABS: ORAL_TABLET | ORAL | Status: DC

## 2015-03-16 NOTE — Progress Notes (Signed)
Family Medicine Teaching Service Daily Progress Note Intern Pager: [phone]  Patient name: Kendra Hall Medical record number: [id number] Date of birth: May 24, 1966 Age: 49 y.o. Gender: female  Primary Care Provider: Junie Panning, DO Consultants: None  Code Status: Full  Pt Overview and Major Events to Date:  9/21: Admitted for CVA R/O   Assessment and Plan: Kendra Hall is a 49 y.o. female presenting with a headache and back pain. PMH is significant for T2DM, tobacco abuse, migraines, lower back pain, Graves disease s/p RI therapy now hypothyroidism,   Left sided weakness/Headache: Patient's symptoms sporadic. It does not sound like the weakness is originating from her lower back. Given the patient's h/o hypothyroidism and medication non-compliance, would like to rule out hypothyroidism. This could be atypical migraines given the headache was the inciting factor- the patient is supposed to be on prophylaxis with Topamax however has not started it. Will also proceed with a stroke work up, patient is not in the window of tPA.  - discontinue telemetry  - MRI/MRA of brain: no acute finding; few punctate foci in the cerebral hemispheric white matter that could be an early manifestation of small vessel disease or migraine related foci  - Lipid Panel: Cholesterol 118, HDL 37, LDL 58  - PT/OT/SLP pending  - neuro checks have been unremarkable  -Migraine Cocktail: Compazine 10 mg q 6 hours andToradol 15 mg q 6 hours; will give Protonix 40 mg BID 2/2 to patient's h/o of gastric ulcer; Dexamethasone 10mg  daily  -restart Topamax 25 mg daily  -consider discussing statin therapy with patient outpatient based on CVD 10 year risk  -would use ASA with caution in this patient 2/2 to h/o of gastric ulcer   Back pain: Has a h/o chronic back pain. Appears more paraspinal in nature. No tenderness to palpation in the midline over the spinous processes.  - Flexeril 5mg  TID prn spasms   Diabetes: Last  A1c was 6.76 on 01/25/15 - Hold home Amaryl - Sensitive SSI  - Monitor CBGs  Anemia: HgB 9.1 (9/22). MCV is microcytic. Her Hemoglobin has been low to 9-10 before in the past. She thinks she took Fe supplements at one time. Does not have Fe studies in the EMR.  -Fe panel ordered   Tobacco abuse: Smokes 5 cigs/day. Not interested in a nicotine patch currently.   Hypothyroidism s/p radioiodine treatment for Grave's disease: Last TSH was significantly elevated at 51, however per PCP, she was not taking her medication regularly. - TSH (9/21): 2.864  - continue home levothyroxine 160mcg for now  FEN/GI: heart healthy diet  Prophylaxis: transition from SCDs --> Lovenox 40 mg   Disposition: Home Pending Improvement of Headache and PT/OT recs   Subjective:  Reports continued back pain. Headache mildly improved. Complains of left eye pain, weakness.   Objective: Temp:  [98.1 F (36.7 C)-98.6 F (37 C)] 98.1 F (36.7 C) (09/22 0200) Pulse Rate:  [82-90] 83 (09/22 0200) Resp:  [18] 18 (09/21 1610) BP: (126-146)/(56-76) 127/56 mmHg (09/22 0200) SpO2:  [98 %-100 %] 98 % (09/22 0200) Weight:  [197 lb (89.359 kg)] 197 lb (89.359 kg) (09/21 1430) Physical Exam: General: lying in bed in NAD  Cardiovascular: RRR. No murmurs appreciated.  Respiratory: CTAB. Normal WOB.  Abdomen: soft, NTND  Extremities: No LE edema.  MSK: Left lumbar paraspinal muscles tight and TTP.  Neuro: CN II-XII grossly intact. Strength 4/5 in left LE, 5/5 in other extremities. Sensation grossly intact throughout face and extremities. No  facial droop noted. Speech normal.  Psych: Appropriate mood and affect.   Laboratory:  Recent Labs Lab 03/15/15 2045 03/16/15 0427  WBC 7.0 7.3  HGB 9.4* 9.1*  HCT 30.2* 28.8*  PLT 374 383    Recent Labs Lab 03/15/15 2045 03/16/15 0427  NA 139 134*  K 4.2 3.7  CL 105 100*  CO2 26 27  BUN 11 14  CREATININE 0.88 1.26*  CALCIUM 9.1 8.4*  PROT 6.7  --   BILITOT 0.5  --    ALKPHOS 64  --   ALT 9*  --   AST 15  --   GLUCOSE 117* 165*   Cholesterol 0 - 200 mg/dL 118   Triglycerides <150 mg/dL 113   HDL >40 mg/dL 37 (L)   Total CHOL/HDL Ratio RATIO 3.2   VLDL 0 - 40 mg/dL 23   LDL Cholesterol 0 - 99 mg/dL 58          Imaging/Diagnostic Tests: Mr Brain Wo Contrast  03/15/2015   CLINICAL DATA:  Episodes of headache with slurred speech and facial weakness. Left-sided lower extremity weakness as well.  EXAM: MRI HEAD WITHOUT CONTRAST  MRA HEAD WITHOUT CONTRAST  TECHNIQUE: Multiplanar, multiecho pulse sequences of the brain and surrounding structures were obtained without intravenous contrast. Angiographic images of the head were obtained using MRA technique without contrast.  COMPARISON:  CT 01/14/2006  FINDINGS: MRI HEAD FINDINGS  Diffusion imaging does not show any acute or subacute infarction. The brainstem and cerebellum are normal. The cerebral hemispheres are normal except for a few scattered foci of T2 and FLAIR signal in the white matter which are nonspecific. These could beam small-vessel ischemic changes or could be migraine related foci. No cortical or large vessel territory infarction. No mass lesion, hemorrhage, hydrocephalus or extra-axial collection. No pituitary mass. No inflammatory sinus disease.  MRA HEAD FINDINGS  Both internal carotid arteries are widely patent into the brain. The anterior and middle cerebral vessels are normal without proximal stenosis, aneurysm or vascular malformation. Both vertebral arteries are patent to the basilar. No basilar stenosis. Posterior circulation branch vessels are normal. Patent posterior communicating arteries bilaterally.  IMPRESSION: No acute finding. Few punctate foci of T2 and FLAIR signal in the cerebral hemispheric white matter that could be an early manifestation of small vessel disease or, given the history, migraine related foci.  Normal intracranial MR angiography of the large and medium size  vessels.   Electronically Signed   By: Nelson Chimes M.D.   On: 03/15/2015 20:17   Mr Jodene Nam Head/brain Wo Cm  03/15/2015   CLINICAL DATA:  Episodes of headache with slurred speech and facial weakness. Left-sided lower extremity weakness as well.  EXAM: MRI HEAD WITHOUT CONTRAST  MRA HEAD WITHOUT CONTRAST  TECHNIQUE: Multiplanar, multiecho pulse sequences of the brain and surrounding structures were obtained without intravenous contrast. Angiographic images of the head were obtained using MRA technique without contrast.  COMPARISON:  CT 01/14/2006  FINDINGS: MRI HEAD FINDINGS  Diffusion imaging does not show any acute or subacute infarction. The brainstem and cerebellum are normal. The cerebral hemispheres are normal except for a few scattered foci of T2 and FLAIR signal in the white matter which are nonspecific. These could beam small-vessel ischemic changes or could be migraine related foci. No cortical or large vessel territory infarction. No mass lesion, hemorrhage, hydrocephalus or extra-axial collection. No pituitary mass. No inflammatory sinus disease.  MRA HEAD FINDINGS  Both internal carotid arteries are widely patent into  the brain. The anterior and middle cerebral vessels are normal without proximal stenosis, aneurysm or vascular malformation. Both vertebral arteries are patent to the basilar. No basilar stenosis. Posterior circulation branch vessels are normal. Patent posterior communicating arteries bilaterally.  IMPRESSION: No acute finding. Few punctate foci of T2 and FLAIR signal in the cerebral hemispheric white matter that could be an early manifestation of small vessel disease or, given the history, migraine related foci.  Normal intracranial MR angiography of the large and medium size vessels.   Electronically Signed   By: Nelson Chimes M.D.   On: 03/15/2015 20:17    Nicolette Bang, DO 03/16/2015, 7:26 AM PGY-1, [place] Intern pager: [phone], text pages  welcome

## 2015-03-16 NOTE — Progress Notes (Signed)
Received a report from previous nurse that pt is getting discharged. Pt has order for discharge. Discharge summary review with pt and verbalized understanding. Pt discharged with daughter via wheelchair. All belongings with pt.  Pt stated that pt is allergic to Ibuprofen and is still on the list of discharge summary.  Educated pt on not to take the medication and not pick it up at the store. Was going to cross out it on the list of medication but pt refused.

## 2015-03-16 NOTE — Care Management Note (Signed)
Case Management Note  Patient Details  Name: Kendra Hall MRN: [id number] Date of Birth: 1966-01-28  Subjective/Objective:                    Action/Plan: Patient admitted with TIA. Patient lives at home with family. MD has ordered for Home Health PT at discharge. CM spoke with the patient and currently she is refusing these services. CM informed patient that if she changes her mind to let staff know. MD also had ordered a rolling walker for the Patient. [employer] was notified and the walker was delivered to the patients room.   Expected Discharge Date:                  Expected Discharge Plan:  Home/Self Care  In-House Referral:     Discharge planning Services  CM Consult  Post Acute Care Choice:    Choice offered to:     DME Arranged:  Walker rolling DME Agency:  [place]:    [facility] Agency:     Status of Service:  In process, will continue to follow  Medicare Important Message Given:    Date Medicare IM Given:    Medicare IM give by:    Date Additional Medicare IM Given:    Additional Medicare Important Message give by:     If discussed at [place] of Stay Meetings, dates discussed:    Additional Comments:  Pollie Friar, RN 03/16/2015, 2:39 PM

## 2015-03-16 NOTE — Evaluation (Signed)
Physical Therapy Evaluation Patient Details Name: Kendra Hall MRN: [id number] DOB: Jan 26, 1966 Today's Date: 03/16/2015   History of Present Illness  48 Y/O F with PMX of of Dm2,HTN, Tobacco abuse, Chronic migraine, Hypothyroidism s/p radiation therapy, Graves dx, asthma and back pain admitted due to headache x 1 week duration with left sided weakness and numbness as well as back pain.  Head scans negative for acute process.  Clinical Impression  Patient presents with decreased independence with mobility due to deficits listed in PT problem list.  She will benefit from skilled PT in the acute setting to maximize mobility for safe d/c home with intermittent assist from family and HHPT.  Feel she will be safe to be alone in the apartment when son at work, but will need assist for home entry due to flight of stairs and possibly for shower.  Have educated about equipment for bathroom, but pt to decide later if wants to get tub bench or 3:1 over toilet.  Will benefit from frequent walks in hall with nursing assist.    Follow Up Recommendations Home health PT    Equipment Recommendations  Rolling walker with 5" wheels    Recommendations for Other Services       Precautions / Restrictions Precautions Precautions: Fall      Mobility  Bed Mobility Overal bed mobility: Modified Independent             General bed mobility comments: began to educate how to get up due to back pain, but then patient adjusted technique without instruction  Transfers Overall transfer level: Needs assistance   Transfers: Sit to/from Stand Sit to Stand: Min guard         General transfer comment: increased time to rise and heavily flexed to start, pushing up to erect with hands on legs  Ambulation/Gait Ambulation/Gait assistance: Min assist;Supervision Ambulation Distance (Feet): 120 Feet Assistive device: 1 person hand held assist;Rolling walker (2 wheeled) Gait Pattern/deviations: Step-through  pattern;Trunk flexed;Decreased stride length;Decreased stance time - left;Antalgic     General Gait Details: initially provided HHA to walk in room, but patient c/o increased back pain/spasm so provided walker and pt more independent and tolerated better hallway ambulation  Stairs            Wheelchair Mobility    Modified Rankin (Stroke Patients Only)       Balance Overall balance assessment: Needs assistance           Standing balance-Leahy Scale: Fair Standing balance comment: can stand static without UE support, but due to back pain and/or left LE weakness needs UE support for ambulation                             Pertinent Vitals/Pain Pain Assessment: 0-10 Pain Score: 9  Pain Location: left side low back, 7 headache Pain Descriptors / Indicators: Aching;Sharp Pain Intervention(s): Repositioned;Monitored during session;RN gave pain meds during session    [place] expects to be discharged to:: Private residence Living Arrangements: Spouse/significant other Available Help at Discharge: Family Type of Home: Apartment Home Access: Stairs to enter Entrance Stairs-Rails: Psychiatric nurse of Steps: flight Home Layout: One level Home Equipment: None      Prior Function Level of Independence: Independent         Comments: worked in [place]: Right    Extremity/Trunk Assessment  Lower Extremity Assessment: LLE deficits/detail   LLE Deficits / Details: AROM WFL, strength overall 3+ to 4-/5; coordintaiton WNL     Communication   Communication: No difficulties  Cognition Arousal/Alertness: Awake/alert Behavior During Therapy: WFL for tasks assessed/performed Overall Cognitive Status: Within Functional Limits for tasks assessed                      General Comments General comments (skin integrity, edema, etc.): Encouraged pt to walk  several more times for short distances with help from nursing staff to improve back pain as well as weakness in left LE    Exercises        Assessment/Plan    PT Assessment Patient needs continued PT services  PT Diagnosis Abnormality of gait;Acute pain;Generalized weakness   PT Problem List Decreased strength;Decreased activity tolerance;Decreased balance;Pain;Decreased mobility;Decreased knowledge of use of DME  PT Treatment Interventions DME instruction;Balance training;Gait training;Stair training;Functional mobility training;Patient/family education;Therapeutic activities;Therapeutic exercise   PT Goals (Current goals can be found in the Care Plan section) Acute Rehab PT Goals Patient Stated Goal: To return to independent PT Goal Formulation: With patient Time For Goal Achievement: 03/23/15 Potential to Achieve Goals: Good    Frequency Min 3X/week   Barriers to discharge Decreased caregiver support son and his girlfriend work    Co-evaluation               End of Session   Activity Tolerance: Patient limited by pain Patient left: in bed;with call bell/phone within reach      Functional Assessment Tool Used: Clinical Judgement Functional Limitation: Mobility: Walking and moving around Mobility: Walking and Moving Around Current Status [phone]): At least 20 percent but less than 40 percent impaired, limited or restricted Mobility: Walking and Moving Around Goal Status [phone]): At least 1 percent but less than 20 percent impaired, limited or restricted    Time: 0936-1006 PT Time Calculation (min) (ACUTE ONLY): 30 min   Charges:   PT Evaluation $Initial PT Evaluation Tier I: 1 Procedure PT Treatments $Gait Training: 8-22 mins   PT G Codes:   PT G-Codes **NOT FOR INPATIENT CLASS** Functional Assessment Tool Used: Clinical Judgement Functional Limitation: Mobility: Walking and moving around Mobility: Walking and Moving Around Current Status ([id number]): At least  20 percent but less than 40 percent impaired, limited or restricted Mobility: Walking and Moving Around Goal Status [phone]): At least 1 percent but less than 20 percent impaired, limited or restricted    [facility] 03/16/2015, 10:51 AM Magda Kiel, PT [phone] 03/16/2015

## 2015-03-16 NOTE — Discharge Summary (Signed)
[facility] Discharge Summary  Patient name: Kendra Hall Medical record number: [id number] Date of birth: [birth year] Age: 49 y.o. Gender: female Date of Admission: 03/15/2015  Date of Discharge: 03/16/2015 Admitting Physician: Kinnie Feil, MD  Primary Care Provider: Junie Panning, DO Consultants: None   Indication for Hospitalization: Status Migrainosus; CVA R/O  Discharge Diagnoses/Problem List:  Status Migrainosus  Chronic Back Pain Type II Diabetes  Anemia  Tobacco Abuse  Hypothyroidism s/p radioiodine tx for Grave's disease    Disposition: Home   Discharge Condition: Stable    Brief Hospital Course:  Kendra Hall is 49 y.o. female who presented with left side headache, back pain and left sided weakness/numbess. PMH significant for migraines, T2DM, tobacco abuse, h/o of gastric ulcers, lower back pain, and Graves disease s/p RI therapy now hypothyroidism.   Her HA began 2 weeks prior to admission in the left parietal area; complains of feeling like left eyelid is drooping. Tried Flexeril, 1/2 tab Hydrocodone, 500 mg Tylenol, and Maxalt without relief. Has not been taking Topamax for over a month. The night prior to admission she had sudden onset of back pain mostly in left side. Unable to determine when weakness of left side began, but thinks it began with onset of headache and has been worsening.   Given patient's symptoms, MRI/MRA brain obtained to rule out CVA. There was no acute finding on imaging. Demonstrated few punctate foci in the cerebral hemispheric white matter that could be early manifestation of small vessel disease or migraine related foci.   Patient has also been non-compliant with levothyroxine therapy in the past. TSH on 01/25/15 was elevated to 51. She reports that she began taking levothyroxine after that and TSH in the hospital was normal at 2.8.   Due to normal thyroid levels and no evidence of CVA on MRI, believe this  could be atypical presentation of migraine/status migrainosus 2/2 to overuse of abortive medications with no use of migraine prophylaxis medication. Patient was given Compazine 10 mg q 6 hours and Toradol 15 mg q 6 hours. Due to history of gastric ulcer, she was also given Protonix 40 mg BID. Additionally, she was given Dexamethasone 10 mg daily and restarted on Topamax at 25 mg daily. Headache improved with these medications and she was discharged with 2 days of Dexamethasone and 3 days of Ibuprofen, Compazine, and Benadryl.   It was noted that patient had HgB of 9.1 at admission. MCV was microcytic. Fe studies were ordered and Fe and ferritin were both low.    Issues for Follow Up:  1. Consider discussion of statin therapy with patient; has 10 year CVD risk of 9.3% but LDL <70 2. Restarted Topamax 25 mg. Will need to titrate up by 25 mg weekly to reach recommended dose of 100 mg daily.  3. Continue to counsel patient on importance of not overusing rescue analgesics --> can cause status migrainosus  4. Will need monitoring of TSH levels to ensure continued compliance with Synthroid therapy  5. Iron panel returned after patient was discharged, consider Fe supplements.   Significant Procedures: None  Significant Labs and Imaging:   Recent Labs Lab 03/15/15 2045 03/16/15 0427  WBC 7.0 7.3  HGB 9.4* 9.1*  HCT 30.2* 28.8*  PLT 374 383    Recent Labs Lab 03/15/15 2045 03/16/15 0427  NA 139 134*  K 4.2 3.7  CL 105 100*  CO2 26 27  GLUCOSE 117* 165*  BUN 11 14  CREATININE 0.88 1.26*  CALCIUM 9.1 8.4*  ALKPHOS 64  --   AST 15  --   ALT 9*  --   ALBUMIN 3.3*  --    Cholesterol 0 - 200 mg/dL 118   Triglycerides <150 mg/dL 113   HDL >40 mg/dL 37 (L)   Total CHOL/HDL Ratio RATIO 3.2   VLDL 0 - 40 mg/dL 23   LDL Cholesterol 0 - 99 mg/dL 58            TSH 0.350 - 4.500 uIU/mL 2.864       Ferritin 11 - 307 ng/mL 4 (L)       Iron 28 - 170  ug/dL 16 (L)   TIBC 250 - 450 ug/dL 391   Saturation Ratios 10.4 - 31.8 % 4 (L)   UIBC ug/dL 375        Mr Brain Wo Contrast  03/15/2015   CLINICAL DATA:  Episodes of headache with slurred speech and facial weakness. Left-sided lower extremity weakness as well.  EXAM: MRI HEAD WITHOUT CONTRAST  MRA HEAD WITHOUT CONTRAST  TECHNIQUE: Multiplanar, multiecho pulse sequences of the brain and surrounding structures were obtained without intravenous contrast. Angiographic images of the head were obtained using MRA technique without contrast.  COMPARISON:  CT 01/14/2006  FINDINGS: MRI HEAD FINDINGS  Diffusion imaging does not show any acute or subacute infarction. The brainstem and cerebellum are normal. The cerebral hemispheres are normal except for a few scattered foci of T2 and FLAIR signal in the white matter which are nonspecific. These could beam small-vessel ischemic changes or could be migraine related foci. No cortical or large vessel territory infarction. No mass lesion, hemorrhage, hydrocephalus or extra-axial collection. No pituitary mass. No inflammatory sinus disease.  MRA HEAD FINDINGS  Both internal carotid arteries are widely patent into the brain. The anterior and middle cerebral vessels are normal without proximal stenosis, aneurysm or vascular malformation. Both vertebral arteries are patent to the basilar. No basilar stenosis. Posterior circulation branch vessels are normal. Patent posterior communicating arteries bilaterally.  IMPRESSION: No acute finding. Few punctate foci of T2 and FLAIR signal in the cerebral hemispheric white matter that could be an early manifestation of small vessel disease or, given the history, migraine related foci.  Normal intracranial MR angiography of the large and medium size vessels.   Electronically Signed   By: Nelson Chimes M.D.   On: 03/15/2015 20:17   Mr Jodene Nam Head/brain Wo Cm  03/15/2015   CLINICAL DATA:  Episodes of headache with slurred speech and  facial weakness. Left-sided lower extremity weakness as well.  EXAM: MRI HEAD WITHOUT CONTRAST  MRA HEAD WITHOUT CONTRAST  TECHNIQUE: Multiplanar, multiecho pulse sequences of the brain and surrounding structures were obtained without intravenous contrast. Angiographic images of the head were obtained using MRA technique without contrast.  COMPARISON:  CT 01/14/2006  FINDINGS: MRI HEAD FINDINGS  Diffusion imaging does not show any acute or subacute infarction. The brainstem and cerebellum are normal. The cerebral hemispheres are normal except for a few scattered foci of T2 and FLAIR signal in the white matter which are nonspecific. These could beam small-vessel ischemic changes or could be migraine related foci. No cortical or large vessel territory infarction. No mass lesion, hemorrhage, hydrocephalus or extra-axial collection. No pituitary mass. No inflammatory sinus disease.  MRA HEAD FINDINGS  Both internal carotid arteries are widely patent into the brain. The anterior and middle cerebral vessels are normal without proximal stenosis, aneurysm or vascular malformation.  Both vertebral arteries are patent to the basilar. No basilar stenosis. Posterior circulation branch vessels are normal. Patent posterior communicating arteries bilaterally.  IMPRESSION: No acute finding. Few punctate foci of T2 and FLAIR signal in the cerebral hemispheric white matter that could be an early manifestation of small vessel disease or, given the history, migraine related foci.  Normal intracranial MR angiography of the large and medium size vessels.   Electronically Signed   By: Nelson Chimes M.D.   On: 03/15/2015 20:17    Results/Tests Pending at Time of Discharge: None   Discharge Medications:    Medication List    STOP taking these medications        esomeprazole 40 MG capsule  Commonly known as:  Learned these medications        albuterol 108 (90 BASE) MCG/ACT inhaler  Commonly known as:  PROAIR HFA   Inhale 2 puffs into the lungs every 4 (four) hours as needed for wheezing.     baclofen 10 MG tablet  Commonly known as:  LIORESAL  Take 1 tablet (10 mg total) by mouth 3 (three) times daily. For headache pains     benzonatate 100 MG capsule  Commonly known as:  TESSALON  take 1 capsule by mouth twice a day for cough     cyclobenzaprine 5 MG tablet  Commonly known as:  FLEXERIL  Take 1 tablet (5 mg total) by mouth 3 (three) times daily as needed for muscle spasms.     dexamethasone 2 MG tablet  Commonly known as:  DECADRON  Take 5 tablets (10 mg total) by mouth daily.     diphenhydrAMINE 50 MG tablet  Commonly known as:  BENADRYL  Take 1 tablet every 6 hours as needed     glimepiride 2 MG tablet  Commonly known as:  AMARYL  Take 1 tablet (2 mg total) by mouth daily with breakfast.     ibuprofen 400 MG tablet  Commonly known as:  ADVIL,MOTRIN  Take 1 tablet (400 mg total) by mouth every 6 (six) hours as needed. Take only in combination with Protonix     levothyroxine 175 MCG tablet  Commonly known as:  SYNTHROID, LEVOTHROID  Take 1 tablet (175 mcg total) by mouth daily.     pantoprazole 40 MG tablet  Commonly known as:  PROTONIX  Take 1 tablet (40 mg total) by mouth 2 (two) times daily.     prochlorperazine 10 MG tablet  Commonly known as:  COMPAZINE  Take 1 tablet (10 mg total) by mouth every 6 (six) hours as needed for nausea or vomiting.     promethazine 25 MG tablet  Commonly known as:  PHENERGAN  Take 1 tablet (25 mg total) by mouth every 6 (six) hours as needed for nausea or vomiting.     rizatriptan 10 MG disintegrating tablet  Commonly known as:  MAXALT-MLT  Take 1 tablet (10 mg total) by mouth as needed for migraine. May repeat in 2 hours if needed     topiramate 25 MG tablet  Commonly known as:  TOPAMAX  Take 1 tablet (25 mg total) by mouth daily at 10 pm.        Discharge Instructions: Please refer to Patient Instructions section of EMR for full  details.  Patient was counseled important signs and symptoms that should prompt return to medical care, changes in medications, dietary instructions, activity restrictions, and follow up appointments.   Follow-Up Appointments: Follow-up  Information    Follow up with [facility], MD On 03/24/2015.   Specialty:  Family Medicine   Why:  at 9:15AM   Contact information:   [place] [id number] [place], DO 03/17/2015, 2:18 PM PGY-1, [place]

## 2015-03-16 NOTE — Evaluation (Signed)
Occupational Therapy Evaluation Patient Details Name: Kendra Hall MRN: [id number] DOB: 1966-03-25 Today's Date: 03/16/2015    History of Present Illness 49 Y/O F with PMX of of Dm2,HTN, Tobacco abuse, Chronic migraine, Hypothyroidism s/p radiation therapy, Graves dx, asthma and back pain admitted due to headache x 1 week duration with left sided weakness and numbness as well as back pain.  Head scans negative for acute process.   Clinical Impression   Pt admitted for above diagnosis and has deficits listed below. Pt would benefit from one more session of acute OT to practice stepping over the edge of a tub for a tub transfer and to become fully independent toileting.  Feel pt can be at home w/o supervision.  Sock aid introduced but pt does not want to purchase and can get assist at home. Will follow only while on acute care.    Follow Up Recommendations  No OT follow up;Supervision - Intermittent    Equipment Recommendations  Tub/shower seat;Other (comment) (pt wants to get home and decide if she needs this.)    Recommendations for Other Services       Precautions / Restrictions Precautions Precautions: Fall Restrictions Weight Bearing Restrictions: No      Mobility Bed Mobility Overal bed mobility: Modified Independent             General bed mobility comments: Pt safe getting to EOB and explained how she does it at home w/o bedrails.  Transfers Overall transfer level: Needs assistance Equipment used: Rolling walker (2 wheeled) Transfers: Sit to/from [facility] Sit to Stand: Supervision Stand pivot transfers: Supervision       General transfer comment: Pt required increased time due to pain but was safe with walker.    Balance Overall balance assessment: Needs assistance Sitting-balance support: Feet supported Sitting balance-Leahy Scale: Normal     Standing balance support: During functional activity;Bilateral upper extremity  supported Standing balance-Leahy Scale: Fair Standing balance comment: Pt cannot let gbo of walker very long to do adls using B hands at same time due to pain.                            ADL Overall ADL's : Needs assistance/impaired Eating/Feeding: Independent;Sitting   Grooming: Wash/dry face;Oral care;Brushing hair;Supervision/safety;Standing   Upper Body Bathing: Set up;Sitting   Lower Body Bathing: Minimal assistance;Sit to/from stand Lower Body Bathing Details (indicate cue type and reason): min assist to reach L foot. Upper Body Dressing : Set up;Sitting   Lower Body Dressing: Minimal assistance;Sit to/from stand Lower Body Dressing Details (indicate cue type and reason): min assist for L sock and shoe.  Introduced sock aid but pt states she has assist at home for socks if needed. Toilet Transfer: Supervision/safety;Ambulation;Comfort height toilet;Grab bars Toilet Transfer Details (indicate cue type and reason): pt has a sink next to her toilet at home and does not need or want elevated commode seat. Toileting- Clothing Manipulation and Hygiene: Supervision/safety;Sitting/lateral Chartered certified accountant Details (indicate cue type and reason): need to practice stepping over the side of a tub/shower. Functional mobility during ADLs: Supervision/safety;Rolling walker General ADL Comments: Pt did very well with all adls despite moving slowly and experiencing pain.  Pt pain too high in L leg to be able to donn and doff socks and shoes on this foot.     Vision Vision Assessment?: No apparent visual deficits   Perception     Praxis  Pertinent Vitals/Pain Pain Assessment: 0-10 Pain Score: 8  Pain Location: L side low back and headache Pain Descriptors / Indicators: Aching;Sharp Pain Intervention(s): Monitored during session;Repositioned;Limited activity within patient's tolerance     Hand Dominance Right   Extremity/Trunk Assessment Upper Extremity  Assessment Upper Extremity Assessment: Overall WFL for tasks assessed   Lower Extremity Assessment Lower Extremity Assessment: Defer to PT evaluation LLE Deficits / Details: AROM WFL, strength overall 3+ to 4-/5; coordintaiton WNL LLE Sensation: decreased light touch (only on left middle finger)   Cervical / Trunk Assessment Cervical / Trunk Assessment: Normal   Communication Communication Communication: No difficulties   Cognition Arousal/Alertness: Awake/alert Behavior During Therapy: WFL for tasks assessed/performed Overall Cognitive Status: Within Functional Limits for tasks assessed                     General Comments       Exercises       Shoulder Instructions      Home Living Family/patient expects to be discharged to:: Private residence Living Arrangements: Spouse/significant other Available Help at Discharge: Family Type of Home: Apartment Home Access: Stairs to enter Technical brewer of Steps: flight Entrance Stairs-Rails: Right;Left Home Layout: One level     Bathroom Shower/Tub: Tub/shower unit Shower/tub characteristics: Architectural technologist: Standard     Home Equipment: None   Additional Comments: May need shower chair/tub bench but wants to decide once home and she tries it.      Prior Functioning/Environment Level of Independence: Independent        Comments: worked in Therapist, art    OT Diagnosis: Generalized weakness;Acute pain   OT Problem List: Decreased activity tolerance;Impaired balance (sitting and/or standing);Decreased knowledge of use of DME or AE;Pain   OT Treatment/Interventions: Self-care/ADL training;Therapeutic activities    OT Goals(Current goals can be found in the care plan section) Acute Rehab OT Goals Patient Stated Goal: to be independent. OT Goal Formulation: With patient Time For Goal Achievement: 03/23/15 Potential to Achieve Goals: Good ADL Goals Pt Will Perform Tub/Shower Transfer: Tub  transfer;rolling walker;ambulating;with supervision Additional ADL Goal #1: Pt will walk to bathroom and toilet on comfort commode with rails with mod I.  OT Frequency: Min 2X/week   Barriers to D/C:            Co-evaluation              End of Session Equipment Utilized During Treatment: Surveyor, mining Communication: Mobility status  Activity Tolerance: Patient limited by pain Patient left: in bed;with call bell/phone within reach;with nursing/sitter in room   Time: 1100-1120 OT Time Calculation (min): 20 min Charges:  OT General Charges $OT Visit: 1 Procedure OT Evaluation $Initial OT Evaluation Tier I: 1 Procedure G-Codes: OT G-codes **NOT FOR INPATIENT CLASS** Functional Assessment Tool Used: clinical judgement Functional Limitation: Self care Self Care Current Status (Y9244): At least 20 percent but less than 40 percent impaired, limited or restricted Self Care Goal Status (Q2863): At least 1 percent but less than 20 percent impaired, limited or restricted  Glenford Peers 03/16/2015, 11:31 AM  [phone]

## 2015-03-16 NOTE — Discharge Instructions (Signed)
You were admitted for evaluation of your headache, back pain, and left sided weakness. Imaging of your brain did NOT show any acute findings of a stroke but did show some changes that can be usually seen in patients who have migraines. Your symptoms were likely migraine related; additionally your headaches seemed to improve with a "migraine cocktail" which included a few medications. - Please continue to take Dexamethasone 1 tablet daily for the next two days.   - Please continue to take Topamax as prescribed (this is a lower dose than what you were previously on)  - Please continue taking the following medications for the next three days; this is to help with your migraines:  - Ibuprofen, Compazine, Benadryl  - Please take Protonix 40mg  1 tablet twice a day as long as you are taking ibuprofen for your migraines; this is an anti-acid tablet. Then you can resume your home medication of Nexium 40mg  daily (also an anti-acid tablet)  - Please continue to take the other medications as prescribed at hospital discharge  - Please be sure to keep your hospital follow up appointment

## 2015-03-17 NOTE — Progress Notes (Signed)
Patient discharged yesterday. Patient called the unit and stated she had left her walker in the room when she went home. Gilford Rile was found and patient notified by CM.  Patient is to pick up walker at the nurses station.

## 2015-03-24 ENCOUNTER — Inpatient Hospital Stay: Payer: Self-pay | Admitting: Family Medicine

## 2015-03-29 ENCOUNTER — Other Ambulatory Visit: Payer: Self-pay | Admitting: Internal Medicine

## 2015-03-29 ENCOUNTER — Ambulatory Visit (INDEPENDENT_AMBULATORY_CARE_PROVIDER_SITE_OTHER): Payer: 59 | Admitting: Internal Medicine

## 2015-03-29 VITALS — BP 134/75 | HR 80 | Temp 98.9°F | Wt 200.4 lb

## 2015-03-29 DIAGNOSIS — Z8659 Personal history of other mental and behavioral disorders: Secondary | ICD-10-CM | POA: Diagnosis not present

## 2015-03-29 DIAGNOSIS — G43701 Chronic migraine without aura, not intractable, with status migrainosus: Secondary | ICD-10-CM

## 2015-03-29 DIAGNOSIS — E119 Type 2 diabetes mellitus without complications: Secondary | ICD-10-CM | POA: Diagnosis not present

## 2015-03-29 DIAGNOSIS — F39 Unspecified mood [affective] disorder: Secondary | ICD-10-CM | POA: Diagnosis not present

## 2015-03-29 DIAGNOSIS — M797 Fibromyalgia: Secondary | ICD-10-CM

## 2015-03-29 MED ORDER — LEVOTHYROXINE SODIUM 175 MCG PO TABS: 175.0000 ug | ORAL_TABLET | Freq: Every day | ORAL | Status: DC

## 2015-03-29 MED ORDER — METFORMIN HCL 500 MG PO TABS: 500.0000 mg | ORAL_TABLET | Freq: Two times a day (BID) | ORAL | Status: DC

## 2015-03-29 MED ORDER — PANTOPRAZOLE SODIUM 40 MG PO TBEC: 40.0000 mg | DELAYED_RELEASE_TABLET | Freq: Every day | ORAL | Status: DC

## 2015-03-29 MED ORDER — METFORMIN HCL 500 MG PO TABS: 500.0000 mg | ORAL_TABLET | Freq: Every day | ORAL | Status: DC

## 2015-03-29 NOTE — Assessment & Plan Note (Signed)
Patient is doing well after discharge from hospital. Still not compliant with Topamax 25mg  daily. Expressed the importance of this medication to control Migraines. Patient agrees to take Topamax daily.  - continue Topamax 25mg  daily - Rizatriptan PRN

## 2015-03-29 NOTE — Assessment & Plan Note (Signed)
Patient is not taking Amaryl due to feeling jittery and nauseous. Possible that she may be feeling hypoglyemia symptoms. Therefore will discontinue today. Patient is agreeable to restart Metformin. Last A1c was in 01/2015 with 6.7%.  - Restart Metformin 500mg  BID  - follow up in 4 months to repeat A1c since patient has not been taking any medication at home

## 2015-03-29 NOTE — Assessment & Plan Note (Signed)
Notes of bilateral shoulder muscle tightness and lower back muscle spasms for over 2 years. Neuro exam today is unremarkable. Patient is tender to palpation of lumbar paraspinal muscles and trapezius muscles bilaterally. Fibromyalgia likely given chronicity, bilateral symptoms, unremarkable neurologic exam, and with history of mood disorder. - educated patient on proper use of Flexeril PRN (can take up to three times daily PRN) as she has only taken one tablet a day PRN - hesitant to start SNRI with history of possible bipolar disorder and patient is currently not on an antipsychotic; will wait until she is seen by psychiatry (referral made)  - encouraged patient to exercise, do stretching

## 2015-03-29 NOTE — Assessment & Plan Note (Signed)
Patient notes of history of depression and previous diagnosis of bipolar disorder with "highs and lows". States she was previously on Lexapro but stopped taking medication because she felt well. PHQ9 score 21 today. Denies SI or HI. States she saw a psychiatrist years ago. Hesitant to start SSRI or SNRI today as this may precipitate a manic episode with history of possible bipolar disorder.  - referral to psychiatry for further evaluation and initiation of medication

## 2015-03-29 NOTE — Progress Notes (Signed)
Patient ID: Lavonia Dana, female   DOB: 12/20/1965, 49 y.o.   MRN: [id number] Subjective:   CC: hospital follow up   HPI:  Ms Groseclose is a 49 yo female with PMH of status migrainosus, chronic back pain, DM2, Anemia, Tobacco use, and hypothyroidism s/p radioiodine treatment for Graves Disease her for hospital follow up.   Patient was hospitalized from 9/21 to 9/22. She presented with left side headache, back pain and left sided weakness/numbess. Tried Flexeril, 1/2 tab Hydrocodone, 500 mg Tylenol, and Maxalt without relief. Has not been taking Topamax for over a month. Given patient's symptoms, MRI/MRA brain obtained to rule out CVA. There was no acute finding on imaging. Demonstrated few punctate foci in the cerebral hemispheric white matter that could be early manifestation of small vessel disease or migraine related foci. She reports that she began taking levothyroxine after that and TSH in the hospital was normal at 2.8. Due to normal thyroid levels and no evidence of CVA on MRI, it was believed this could be atypical presentation of migraine/status migrainosus 2/2 to overuse of abortive medications with no use of migraine prophylaxis medication. Patient was treated with migraine cocktail and discharged with 2 days of Dexamethasone and 3 days of Ibuprofen, Compazine, and Benadryl. She was restarted on Topamax at a lower dose 25mg  before discharge.   Migraine: Patient has had one mild headache since she was discharged, and has been doing very well. Patient states she has missed about 6 days of Topamax since she was discharged. States she finished Dexamethasone. But has not needed to take Ibuprofen, Benadryl, or compazine.   Chronic muscle pain:  Patient notes of chronic bilateral low back pain and shoulder pain for about 2 years. States this has improved significantly since her discharge from hospital, but still continues to have muscle spasms/tightness in her low back and her shoulders bilaterally.  She also notes of intermittent tingling in her finger tips and feet. Patient has tried Robaxin in the past which has helped. She was recently prescribed Flexeril. She takes only one tablet when he has pain. Patient states this did not improve her pain. She was not aware that she can take 1 tablet three times daily as needed. She is also using a massager which helps with her back pain.   DM: Patient has not been using Amaryl since it was prescribed. She used it for a few days initially, but this medication makes her feel jittery so she discontinued it herself. Her last A1c in August was 6.7. Patient states she was on Metformin at one point but stopped as she thought this medication caused her muscle spasms. Now since she continues to have muscles spasms, she is open to restarting this medication.  Hx of Depression and Bipolar Disorder:  Patient states she was on Lexapro in the past but discontinued it herself as she was feeling well. She also notes she has been diagnosed with Bipolar Disorder (unsure if type 1 or 2 but states she used to have "highs and lows"). Denies SI or HI.   Review of Systems - Per HPI. Additionally, PMH, FH, or SH Smoking status: current everyday smoker     Objective:  Physical Exam BP 134/75 mmHg  Pulse 80  Temp(Src) 98.9 F (37.2 C) (Oral)  Wt 200 lb 6.4 oz (90.901 kg)  LMP 03/09/2015 GEN: NAD  CV: RRR, no murmurs, rubs, or gallops PULM: CTAB, normal effort SKIN: No rash or cyanosis; warm and well-perfused MSK: tenderness to palpation of  lumbar paraspinal muscles and upper trapezius muscle bilaterally  EXTR: No lower extremity edema or calf tenderness PSYCH: Mood and affect euthymic, normal rate and volume of speech NEURO: Awake, alert, no focal deficits grossly, normal speech, 5/5 strength bilaterally in upper and lower extremities, normal sensation to light touch    Assessment:     REXINE GOWENS is a 49 y.o. female with h/o status migrainosus, chronic back  pain, DM2, Anemia, Tobacco use, and hypothyroidism s/p radioiodine treatment for Graves Disease her for hospital follow up    Plan:     # See problem list and after visit summary for problem-specific plans.  Per discharge summary patient was noted to have iron deficiency anemia on iron studies. We did not have enough time to discuss this at this visit. Will inform PCP regarding this.   Follow-up: Follow up in 4 months for DM  Smiley Houseman, MD [place]

## 2015-03-29 NOTE — Patient Instructions (Signed)
For back pain: take Flexaril when needed; you can take up to three times a day Continue taking Synthroid daily Please take Topamax daily; this is important for your migraines We made a referral to psychiatry for your depression and history of bipolar disorder  We refilled your protonix  We discontinued Amaryl because you were feeling jittery. We started you on Metformin. Please take as prescribed.  Please follow up in 4 months or sooner if needed.   Myofascial Pain Syndrome and Fibromyalgia Myofascial pain syndrome and fibromyalgia are both pain disorders. This pain may be felt mainly in your muscles.   Myofascial pain syndrome:  Always has trigger points or tender points in the muscle that will cause pain when pressed. The pain may come and go.  Usually affects your neck, upper back, and shoulder areas. The pain often radiates into your arms and hands.  Fibromyalgia:  Has muscle pains and tenderness that come and go.  Is often associated with fatigue and sleep disturbances.  Has trigger points.  Tends to be long-lasting (chronic), but is not life-threatening. Fibromyalgia and myofascial pain are not the same. However, they often occur together. If you have both conditions, each can make the other worse. Both are common and can cause enough pain and fatigue to make day-to-day activities difficult.  CAUSES  The exact causes of fibromyalgia and myofascial pain are not known. People with certain gene types may be more likely to develop fibromyalgia. Some factors can be triggers for both conditions, such as:   Spine disorders.  Arthritis.  Severe injury (trauma) and other physical stressors.  Being under a lot of stress.  A medical illness. SIGNS AND SYMPTOMS  Fibromyalgia The main symptom of fibromyalgia is widespread pain and tenderness in your muscles. This can vary over time. Pain is sometimes described as stabbing, shooting, or burning. You may have tingling or numbness,  too. You may also have sleep problems and fatigue. You may wake up feeling tired and groggy (fibro fog). Other symptoms may include:   Bowel and bladder problems.  Headaches.  Visual problems.  Problems with odors and noises.  Depression or mood changes.  Painful menstrual periods (dysmenorrhea).  Dry skin or eyes. Myofascial pain syndrome Symptoms of myofascial pain syndrome include:   Tight, ropy bands of muscle.   Uncomfortable sensations in muscular areas, such as:  Aching.  Cramping.  Burning.  Numbness.  Tingling.   Muscle weakness.  Trouble moving certain muscles freely (range of motion). DIAGNOSIS  There are no specific tests to diagnose fibromyalgia or myofascial pain syndrome. Both can be hard to diagnose because their symptoms are common in many other conditions. Your health care provider may suspect one or both of these conditions based on your symptoms and medical history. Your health care provider will also do a physical exam.  The key to diagnosing fibromyalgia is having pain, fatigue, and other symptoms for more than three months that cannot be explained by another condition.  The key to diagnosing myofascial pain syndrome is finding trigger points in muscles that are tender and cause pain elsewhere in your body (referred pain). TREATMENT  Treating fibromyalgia and myofascial pain often requires a team of health care providers. This usually starts with your primary provider and a physical therapist. You may also find it helpful to work with alternative health care providers, such as massage therapists or acupuncturists. Treatment for fibromyalgia may include medicines. This may include nonsteroidal anti-inflammatory drugs (NSAIDs), along with other medicines.  Treatment  for myofascial pain may also include:  Cooling and stretching of muscles.  Trigger point injections.  Sound wave (ultrasound) treatments to stimulate muscles. HOME CARE INSTRUCTIONS    Take medicines only as directed by your health care provider.  Exercise as directed by your health care provider or physical therapist.  Try to avoid stressful situations.  Practice relaxation techniques to control your stress. You may want to try:  Biofeedback.  Visual imagery.  Hypnosis.  Muscle relaxation.  Yoga.  Meditation.  Talk to your health care provider about alternative treatments, such as acupuncture or massage treatment.  Maintain a healthy lifestyle. This includes eating a healthy diet and getting enough sleep.  Consider joining a support group.  Do not do activities that stress or strain your muscles. That includes repetitive motions and heavy lifting. SEEK MEDICAL CARE IF:   You have new symptoms.  Your symptoms get worse.  You have side effects from your medicines.  You have trouble sleeping.  Your condition is causing depression or anxiety. FOR MORE INFORMATION   National Fibromyalgia Association: http://www.fmaware.orgwww.fmaware.[place]: http://www.arthritis.orgwww.arthritis.org  American Chronic Pain Association: StreetWrestling.at.https://stevens.biz/   This information is not intended to replace advice given to you by your health care provider. Make sure you discuss any questions you have with your health care provider.   Document Released: 06/10/2005 Document Revised: 07/01/2014 Document Reviewed: 03/16/2014 Elsevier Interactive Patient Education [employer].

## 2015-03-30 ENCOUNTER — Encounter: Payer: Self-pay | Admitting: Internal Medicine

## 2015-03-31 ENCOUNTER — Other Ambulatory Visit: Payer: Self-pay | Admitting: Internal Medicine

## 2015-03-31 MED ORDER — FERROUS SULFATE 325 (65 FE) MG PO TABS: 325.0000 mg | ORAL_TABLET | Freq: Three times a day (TID) | ORAL | Status: DC

## 2015-03-31 NOTE — Progress Notes (Signed)
Patient's Iron Deficiency Anemia was not discussed during her hospital follow up.  Iron Deficiency Anemia: Sent a prescription for Ferrous Sulfate 325mg  TID as patient's iron studies showed iron deficiency anemia. She is to take this and follow up in 2 weeks to determine if she is tolerating med and check labs.

## 2015-04-04 NOTE — Progress Notes (Signed)
LMOVM to call back. Will try again later. Cortana Vanderford, CMA.

## 2015-04-06 NOTE — Progress Notes (Signed)
Patient informed by b. Mcgregor. Kendra Hall,CMA

## 2015-04-18 ENCOUNTER — Ambulatory Visit (INDEPENDENT_AMBULATORY_CARE_PROVIDER_SITE_OTHER): Payer: 59 | Admitting: Family Medicine

## 2015-04-18 ENCOUNTER — Encounter: Payer: Self-pay | Admitting: Family Medicine

## 2015-04-18 VITALS — BP 143/83 | HR 77 | Temp 98.3°F | Wt 201.0 lb

## 2015-04-18 DIAGNOSIS — M797 Fibromyalgia: Secondary | ICD-10-CM | POA: Diagnosis not present

## 2015-04-18 DIAGNOSIS — Z841 Family history of disorders of kidney and ureter: Secondary | ICD-10-CM

## 2015-04-18 DIAGNOSIS — K219 Gastro-esophageal reflux disease without esophagitis: Secondary | ICD-10-CM | POA: Diagnosis not present

## 2015-04-18 MED ORDER — BENZONATATE 100 MG PO CAPS: ORAL_CAPSULE | ORAL | Status: DC

## 2015-04-18 MED ORDER — GABAPENTIN 300 MG PO CAPS: 300.0000 mg | ORAL_CAPSULE | Freq: Every day | ORAL | Status: DC

## 2015-04-18 NOTE — Assessment & Plan Note (Signed)
-   FMLA paper filled out. Anticipate occasional flares of Fibromyalgia - Discontinue Flexeril. Initiate Gabapentin 300mg  qhs and titrate up as needed. Will avoid SSRIs given history of bipolar - Follow up with psychiatry - Continue to work on sleep hygiene - Follow up as needed for flares - Will obtain IgA, IgG, and IgM given history of deficiencies in family

## 2015-04-18 NOTE — Patient Instructions (Signed)
Thank you so much for coming to visit me today! I have filled out and returned the [facility] paperwork. We will also start Gabapentin 300mg  at night. This might make you drowsy. We can gradually taper up this dose if needed. Improved sleep should also help with your symptoms.   Please let me know if there is anything else I can do for you! Dr. Gerlean Ren  Myofascial Pain Syndrome and Fibromyalgia Myofascial pain syndrome and fibromyalgia are both pain disorders. This pain may be felt mainly in your muscles.   Myofascial pain syndrome:  Always has trigger points or tender points in the muscle that will cause pain when pressed. The pain may come and go.  Usually affects your neck, upper back, and shoulder areas. The pain often radiates into your arms and hands.  Fibromyalgia:  Has muscle pains and tenderness that come and go.  Is often associated with fatigue and sleep disturbances.  Has trigger points.  Tends to be long-lasting (chronic), but is not life-threatening. Fibromyalgia and myofascial pain are not the same. However, they often occur together. If you have both conditions, each can make the other worse. Both are common and can cause enough pain and fatigue to make day-to-day activities difficult.  CAUSES  The exact causes of fibromyalgia and myofascial pain are not known. People with certain gene types may be more likely to develop fibromyalgia. Some factors can be triggers for both conditions, such as:   Spine disorders.  Arthritis.  Severe injury (trauma) and other physical stressors.  Being under a lot of stress.  A medical illness. SIGNS AND SYMPTOMS  Fibromyalgia The main symptom of fibromyalgia is widespread pain and tenderness in your muscles. This can vary over time. Pain is sometimes described as stabbing, shooting, or burning. You may have tingling or numbness, too. You may also have sleep problems and fatigue. You may wake up feeling tired and groggy (fibro fog). Other  symptoms may include:   Bowel and bladder problems.  Headaches.  Visual problems.  Problems with odors and noises.  Depression or mood changes.  Painful menstrual periods (dysmenorrhea).  Dry skin or eyes. Myofascial pain syndrome Symptoms of myofascial pain syndrome include:   Tight, ropy bands of muscle.   Uncomfortable sensations in muscular areas, such as:  Aching.  Cramping.  Burning.  Numbness.  Tingling.   Muscle weakness.  Trouble moving certain muscles freely (range of motion). DIAGNOSIS  There are no specific tests to diagnose fibromyalgia or myofascial pain syndrome. Both can be hard to diagnose because their symptoms are common in many other conditions. Your health care provider may suspect one or both of these conditions based on your symptoms and medical history. Your health care provider will also do a physical exam.  The key to diagnosing fibromyalgia is having pain, fatigue, and other symptoms for more than three months that cannot be explained by another condition.  The key to diagnosing myofascial pain syndrome is finding trigger points in muscles that are tender and cause pain elsewhere in your body (referred pain). TREATMENT  Treating fibromyalgia and myofascial pain often requires a team of health care providers. This usually starts with your primary provider and a physical therapist. You may also find it helpful to work with alternative health care providers, such as massage therapists or acupuncturists. Treatment for fibromyalgia may include medicines. This may include nonsteroidal anti-inflammatory drugs (NSAIDs), along with other medicines.  Treatment for myofascial pain may also include:  NSAIDs.  Cooling and stretching  of muscles.  Trigger point injections.  Sound wave (ultrasound) treatments to stimulate muscles. HOME CARE INSTRUCTIONS   Take medicines only as directed by your health care provider.  Exercise as directed by your  health care provider or physical therapist.  Try to avoid stressful situations.  Practice relaxation techniques to control your stress. You may want to try:  Biofeedback.  Visual imagery.  Hypnosis.  Muscle relaxation.  Yoga.  Meditation.  Talk to your health care provider about alternative treatments, such as acupuncture or massage treatment.  Maintain a healthy lifestyle. This includes eating a healthy diet and getting enough sleep.  Consider joining a support group.  Do not do activities that stress or strain your muscles. That includes repetitive motions and heavy lifting. SEEK MEDICAL CARE IF:   You have new symptoms.  Your symptoms get worse.  You have side effects from your medicines.  You have trouble sleeping.  Your condition is causing depression or anxiety. FOR MORE INFORMATION   National Fibromyalgia Association: http://www.fmaware.orgwww.fmaware.[place]: http://www.arthritis.orgwww.arthritis.org  American Chronic Pain Association: StreetWrestling.at.https://stevens.biz/   This information is not intended to replace advice given to you by your health care provider. Make sure you discuss any questions you have with your health care provider.   Document Released: 06/10/2005 Document Revised: 07/01/2014 Document Reviewed: 03/16/2014 Elsevier Interactive Patient Education [employer].

## 2015-04-18 NOTE — Progress Notes (Signed)
Subjective:     Patient ID: Kendra Hall, female   DOB: [birth year], 49 y.o.   MRN: [id number]  HPI Kendra Hall is a 49yo female presenting today for FMLA paperwork. - Diagnosed with Fibromyalgia at last office visit. - Notes history of pain x2 years. Pain is normally symmetrical and located in shoulders, neck, back, bottom, and legs  - States she is unable to miss work unless [employer] paperwork is filled out. Stresses that she loves her job and wants to go to work as many days as she is able. Reports she has only missed a few days from work, including recent hospitalization 9/21-9/22 and when her daughter had seizures.  - Reports pain occurs daily to every other day and is severe.  - Has not been taking Flexeril TID because it makes her sleepy and she does not want to work with drowsiness.  - Reports improvement in left sided weakness noted at previous office visit. - States family recently discovered that IgA deficiency runs in their family at the passing of one of their members. Requests testing today.  Review of Systems Per HPI    Objective:   Physical Exam  Constitutional: She is oriented to person, place, and time. She appears well-developed and well-nourished. No distress.  Cardiovascular: Normal rate and regular rhythm.  Exam reveals no gallop and no friction rub.   No murmur heard. Pulmonary/Chest: Effort normal. No respiratory distress. She has no wheezes. She has no rales.  Musculoskeletal: She exhibits no edema.  Tender points of bilateral trapezius left worse than right, bilateral back, bilateral buttocks  Neurological: She is alert and oriented to person, place, and time. No cranial nerve deficit.  Muscle strength 5/5 in upper and lower extremities, sensation intact  Skin: No rash noted.  Psychiatric: She has a normal mood and affect. Her behavior is normal.       Assessment and Plan:     Fibromyalgia - FMLA paper filled out. Anticipate occasional flares of Fibromyalgia -  Discontinue Flexeril. Initiate Gabapentin 300mg  qhs and titrate up as needed. Will avoid SSRIs given history of bipolar - Follow up with psychiatry - Continue to work on sleep hygiene - Follow up as needed for flares - Will obtain IgA, IgG, and IgM given history of deficiencies in family  GERD (gastroesophageal reflux disease) - Continue protonix - Tessalon PRN cough

## 2015-04-18 NOTE — Assessment & Plan Note (Signed)
-   Continue protonix - Tessalon PRN cough

## 2015-04-19 ENCOUNTER — Encounter: Payer: Self-pay | Admitting: Family Medicine

## 2015-04-19 LAB — IGG, IGA, IGM
IgA: 271 mg/dL (ref 69–380)
IgG (Immunoglobin G), Serum: 1220 mg/dL (ref 690–1700)
IgM, Serum: 148 mg/dL (ref 52–322)

## 2015-04-19 LAB — TISSUE TRANSGLUTAMINASE, IGA: Tissue Transglutaminase Ab, IgA: 1 U/mL (ref <4)

## 2015-04-28 ENCOUNTER — Telehealth: Payer: Self-pay | Admitting: Family Medicine

## 2015-04-28 NOTE — Telephone Encounter (Signed)
Patient dropped off FMLA papers to be filled out again.  She highlighted what needs to be done.  Please fax when completed and also call patient so she can pick up the form.

## 2015-05-01 NOTE — Telephone Encounter (Signed)
Forms placed in PCP's box. Ottis Stain, CMA

## 2015-05-03 NOTE — Telephone Encounter (Signed)
Formed filled out and left in [employer].

## 2015-05-04 NOTE — Telephone Encounter (Signed)
Pt came to pick up form. Kendra Hall, ASA

## 2015-05-11 ENCOUNTER — Other Ambulatory Visit: Payer: Self-pay | Admitting: Internal Medicine

## 2015-06-22 ENCOUNTER — Ambulatory Visit (INDEPENDENT_AMBULATORY_CARE_PROVIDER_SITE_OTHER): Payer: 59 | Admitting: Student

## 2015-06-22 ENCOUNTER — Encounter: Payer: Self-pay | Admitting: Student

## 2015-06-22 VITALS — BP 134/81 | HR 87 | Temp 98.6°F | Wt 203.7 lb

## 2015-06-22 DIAGNOSIS — F3162 Bipolar disorder, current episode mixed, moderate: Secondary | ICD-10-CM

## 2015-06-22 DIAGNOSIS — M797 Fibromyalgia: Secondary | ICD-10-CM

## 2015-06-22 MED ORDER — CYCLOBENZAPRINE HCL 10 MG PO TABS: 10.0000 mg | ORAL_TABLET | Freq: Three times a day (TID) | ORAL | Status: DC | PRN

## 2015-06-22 MED ORDER — GABAPENTIN 300 MG PO CAPS: 600.0000 mg | ORAL_CAPSULE | Freq: Every day | ORAL | Status: DC

## 2015-06-22 NOTE — Assessment & Plan Note (Signed)
Knee and hip pain without evidence of trauma or deformity concerning for manifestations of fibromyalgia given recent social stressor - will start flexeril TID and increase gabapentin to 600 qHS from 300 qHS - as she states she carries a diagnosis of bipolar but does this is not on her problem list and she has taken antidepressant in past with good tolerance, concern that she potentially could benefit from an SSRI or trycyclic antidepressant - will discuss these options with PCP - referral to psych made again as this was her PCP's original plan at last visit

## 2015-06-22 NOTE — Progress Notes (Signed)
   Subjective:    Patient ID: Kendra Hall, female    DOB: 01/24/1966, 49 y.o.   MRN: [id number]   CC: left thigh pain and right knee pain  HPI 49 y/o presenting for left hip and right knee pain  Right knee pain - Has had this pain "off and on" for months. Previously concerned about a meniscal tear but pain resolved - awoke with pain in her knee 1 week ago, felt as if it were swollen as well - has not had decreased range of motion but has felt it has been difficult to walk secondary to pain - denies trauma to knee  Left hip pain - pain started two days ago - has had similar pain in her shoulder and in the same hip in past associated with her known fobromyalgia - denies trauma - does take gabapentin for pain at night but this has not helped her  Depression - not currently on an antidepresant - per the patient she caries a diagnosis if bipolar disorder - has recently ben stressed by life events, was given short notice that her rent will go up and she will have to move - recently stressed out by her daughter, that she has in past been very close to, but recently she has distanced herself and 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 She feels she tolerated these well  Otherwise denies recent illness, fever, chills, N/V/D   Review of Systems   See HPI for ROS.     Objective:  BP 134/81 mmHg  Pulse 87  Temp(Src) 98.6 F (37 C) (Oral)  Wt 203 lb 11.2 oz (92.398 kg)  LMP 06/12/2015 (Exact Date) Vitals and nursing note reviewed  General: NAD Cardiac: RRR,  Respiratory: CTAB, normal effort Abdomen: soft,  nontender, Extremities: no edema or cyanosis. WWP. Skin: warm and dry, no rashes noted Neuro: alert and oriented, no focal deficits, normal ambulation MSK- no swelling of knees bilaterally, normal range of motion, reports tenderness directly superior to right knee, no rash or lesions. Proximal left anterior thigh pain, no lesions, rash or abrasions   Assessment & Plan:    No problem-specific assessment & plan notes found for this encounter.    Lue Sykora A. Lincoln Brigham MD, [facility] Resident PGY-2 Pager [phone]

## 2015-06-22 NOTE — Patient Instructions (Signed)
Follow up in 1 week with Dr Gerlean Ren to discuss fibromyalgia treatment A referral was made to psychiatry  If you have any questions or concerns, call the office at [phone]

## 2015-07-28 ENCOUNTER — Ambulatory Visit (INDEPENDENT_AMBULATORY_CARE_PROVIDER_SITE_OTHER): Payer: 59 | Admitting: Family Medicine

## 2015-07-28 VITALS — BP 141/76 | HR 87 | Temp 98.4°F | Ht 64.0 in | Wt 199.8 lb

## 2015-07-28 DIAGNOSIS — R42 Dizziness and giddiness: Secondary | ICD-10-CM

## 2015-07-28 LAB — BASIC METABOLIC PANEL WITH GFR
BUN: 19 mg/dL (ref 7–25)
CO2: 27 mmol/L (ref 20–31)
Calcium: 8.9 mg/dL (ref 8.6–10.2)
Chloride: 101 mmol/L (ref 98–110)
Creat: 1.06 mg/dL (ref 0.50–1.10)
GFR, Est African American: 71 mL/min (ref >=60)
GFR, Est Non African American: 62 mL/min (ref >=60)
Glucose, Bld: 119 mg/dL — ABNORMAL HIGH (ref 65–99)
Potassium: 4 mmol/L (ref 3.5–5.3)
Sodium: 139 mmol/L (ref 135–146)

## 2015-07-28 LAB — CBC
HCT: 33.5 % — ABNORMAL LOW (ref 36.0–46.0)
Hemoglobin: 10.4 g/dL — ABNORMAL LOW (ref 12.0–15.0)
MCH: 21.6 pg — ABNORMAL LOW (ref 26.0–34.0)
MCHC: 31 g/dL (ref 30.0–36.0)
MCV: 69.6 fL — ABNORMAL LOW (ref 78.0–100.0)
MPV: 8.9 fL (ref 8.6–12.4)
Platelets: 303 10*3/uL (ref 150–400)
RBC: 4.81 MIL/uL (ref 3.87–5.11)
RDW: 19.5 % — ABNORMAL HIGH (ref 11.5–15.5)
WBC: 6.7 10*3/uL (ref 4.0–10.5)

## 2015-07-28 MED ORDER — MECLIZINE HCL 32 MG PO TABS: 32.0000 mg | ORAL_TABLET | Freq: Three times a day (TID) | ORAL | Status: DC | PRN

## 2015-07-28 NOTE — Patient Instructions (Signed)

## 2015-07-28 NOTE — Assessment & Plan Note (Signed)
Patient is here with complaints of dizziness with associated nausea and one episode of vomiting. Etiology currently unknown. Differential at this time currently includes anemia (patient has a down trending hemoglobin over the past 2.5 years), BPPV (however I was unable to reproduce symptoms with Dix-Hallpike maneuver), orthostatic hypotension (patient reports that symptoms are worse with laying down which does not coincide with this diagnosis), carotid sinus hypersensitivity, Mnire's disease (positive diagnosis of this condition requires at least 3 episodes of dizziness lasting 20 minutes or more, patient's symptoms have only lasted seconds each episode), drug interaction (patient states that she has not been taking the majority of her listed medications including many of the drugs that could put her at risk for dizziness/vertigo), or idiopathic cause. - CBC obtained to assess for anemia - BMP obtained to assess electrolyte homeostasis - Prescription for meclizine provided to help with symptoms. - I have asked patient to follow-up in one week to reassess this issue and possibly for further workup.

## 2015-07-28 NOTE — Progress Notes (Signed)
DIZZINESS Been going on since Monday or Tuesday. Only when laying down. Occurs everytime she lays down. Has had vomiting from this yesterday. Last for a couple seconds. No CP, no HA. This is different from what she experienced in September of last year.  Feeling dizzy for 3-4 days. Dizziness is frequent but only lasts a few seconds. Feels like room spins: yes Lightheadedness when stands: no Palpitations or heart racing: no Prior dizziness: no Medications tried: no Taking blood thinners: no  Symptoms Hearing Loss: no Ear Pain or fullness: no, but some ringing noted (once) Nausea or vomiting: yesterday once Vision difficulty or double vision: blurred Falls: no Head trauma: no Weakness in arm or leg: no Speaking problems: some at work, 2-3 wks ago Headache: no, but h/o migraines.  ROS see HPI Smoking Status noted  Objective: BP 141/76 mmHg  Pulse 87  Temp(Src) 98.4 F (36.9 C) (Oral)  Ht 5\' 4"  (1.626 m)  Wt 199 lb 12.8 oz (90.629 kg)  BMI 34.28 kg/m2  LMP 07/18/2015 (Approximate) Gen: NAD, alert, cooperative HEENT: NCAT, EOMI, PERRL, no nystagmus, conjunctiva without pallor, no sinus tenderness, no LAD CV: RRR, systolic murmur 2/6 noted. Resp: CTAB, no wheezes, non-labored Abd: SNTND, BS present, no guarding or organomegaly Ext: No edema, warm, peripheral pulses intact throughout Neuro: Alert and oriented, Speech clear, No gross deficits. Dix-Hallpike maneuver did not elicit symptoms.  Assessment and plan:  Dizziness Patient is here with complaints of dizziness with associated nausea and one episode of vomiting. Etiology currently unknown. Differential at this time currently includes anemia (patient has a down trending hemoglobin over the past 2.5 years), BPPV (however I was unable to reproduce symptoms with Dix-Hallpike maneuver), orthostatic hypotension (patient reports that symptoms are worse with laying down which does not coincide with this diagnosis), carotid sinus  hypersensitivity, Mnire's disease (positive diagnosis of this condition requires at least 3 episodes of dizziness lasting 20 minutes or more, patient's symptoms have only lasted seconds each episode), drug interaction (patient states that she has not been taking the majority of her listed medications including many of the drugs that could put her at risk for dizziness/vertigo), or idiopathic cause. - CBC obtained to assess for anemia - BMP obtained to assess electrolyte homeostasis - Prescription for meclizine provided to help with symptoms. - I have asked patient to follow-up in one week to reassess this issue and possibly for further workup.    Orders Placed This Encounter  Procedures  . BASIC METABOLIC PANEL WITH GFR  . CBC    Meds ordered this encounter  Medications  . meclizine (ANTIVERT) 32 MG tablet    Sig: Take 1 tablet (32 mg total) by mouth 3 (three) times daily as needed.    Dispense:  30 tablet    Refill:  0     Elberta Leatherwood, MD,MS,  PGY2 07/28/2015 2:29 PM

## 2015-07-31 ENCOUNTER — Telehealth: Payer: Self-pay | Admitting: Family Medicine

## 2015-07-31 NOTE — Telephone Encounter (Signed)
Pt still having continued dizziness. Would like to know her test results from Friday 07/28/15. Please advise.

## 2015-08-01 NOTE — Telephone Encounter (Signed)
Forwarding to Dr. Alease Frame, who saw Kendra Hall at her last office visit.

## 2015-08-01 NOTE — Telephone Encounter (Signed)
Call patient. Got voicemail. Left generalized message stating that lab results were reassuring and/or improved from previous. I informed her that if she continues to have symptoms then she should make a follow-up appointment with her PCP for further evaluation/workup.

## 2015-08-16 ENCOUNTER — Telehealth: Payer: Self-pay | Admitting: Family Medicine

## 2015-08-16 NOTE — Telephone Encounter (Signed)
Pt brought in FMLA papers that needed to be revised. She put sticky notes on the papers showing what needed to be changed.  She requests the forms to be faxed and she will pick up the originals.  Her job requests them to be completed by 08-21-15

## 2015-08-21 NOTE — Telephone Encounter (Signed)
Form placed in provider's box.  Jazmin Hartsell,CMA  

## 2015-08-22 ENCOUNTER — Other Ambulatory Visit (HOSPITAL_COMMUNITY): Payer: Self-pay | Admitting: Psychiatry

## 2015-08-22 ENCOUNTER — Encounter (INDEPENDENT_AMBULATORY_CARE_PROVIDER_SITE_OTHER): Payer: Self-pay

## 2015-08-22 ENCOUNTER — Encounter (HOSPITAL_COMMUNITY): Payer: Self-pay | Admitting: Psychiatry

## 2015-08-22 ENCOUNTER — Ambulatory Visit (INDEPENDENT_AMBULATORY_CARE_PROVIDER_SITE_OTHER): Payer: 59 | Admitting: Psychiatry

## 2015-08-22 VITALS — BP 132/80 | HR 83 | Ht 63.5 in | Wt 198.8 lb

## 2015-08-22 DIAGNOSIS — F4310 Post-traumatic stress disorder, unspecified: Secondary | ICD-10-CM

## 2015-08-22 DIAGNOSIS — F411 Generalized anxiety disorder: Secondary | ICD-10-CM | POA: Diagnosis not present

## 2015-08-22 DIAGNOSIS — F333 Major depressive disorder, recurrent, severe with psychotic symptoms: Secondary | ICD-10-CM | POA: Diagnosis not present

## 2015-08-22 DIAGNOSIS — G4700 Insomnia, unspecified: Secondary | ICD-10-CM | POA: Diagnosis not present

## 2015-08-22 MED ORDER — ESCITALOPRAM OXALATE 20 MG PO TABS: 20.0000 mg | ORAL_TABLET | Freq: Every day | ORAL | Status: DC

## 2015-08-22 MED ORDER — BUSPIRONE HCL 5 MG PO TABS: 5.0000 mg | ORAL_TABLET | Freq: Every day | ORAL | Status: DC
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
## 2015-08-22 NOTE — Telephone Encounter (Signed)
Patient informed that FMLA forms were complete and faxed to number provided.  Patient stated that her son or daughter would be pick up her copies of FMLA forms.  Derl Barrow, RN

## 2015-09-17 ENCOUNTER — Other Ambulatory Visit: Payer: Self-pay | Admitting: Internal Medicine

## 2015-09-19 NOTE — Telephone Encounter (Signed)
Prescription printed and left out front. Delayed due to acute illness of physician.

## 2015-10-03 ENCOUNTER — Encounter: Payer: Self-pay | Admitting: Family Medicine

## 2015-10-03 ENCOUNTER — Ambulatory Visit (INDEPENDENT_AMBULATORY_CARE_PROVIDER_SITE_OTHER): Payer: 59 | Admitting: Family Medicine

## 2015-10-03 ENCOUNTER — Other Ambulatory Visit: Payer: Self-pay | Admitting: Family Medicine

## 2015-10-03 ENCOUNTER — Encounter (HOSPITAL_COMMUNITY): Payer: Self-pay | Admitting: Psychiatry

## 2015-10-03 ENCOUNTER — Ambulatory Visit (INDEPENDENT_AMBULATORY_CARE_PROVIDER_SITE_OTHER): Payer: 59 | Admitting: Psychiatry

## 2015-10-03 VITALS — BP 162/77 | HR 75 | Temp 98.8°F | Ht 64.0 in | Wt 195.6 lb

## 2015-10-03 VITALS — BP 134/78 | HR 74 | Ht 63.0 in | Wt 196.8 lb

## 2015-10-03 DIAGNOSIS — F333 Major depressive disorder, recurrent, severe with psychotic symptoms: Secondary | ICD-10-CM | POA: Diagnosis not present

## 2015-10-03 DIAGNOSIS — M542 Cervicalgia: Secondary | ICD-10-CM

## 2015-10-03 DIAGNOSIS — F4310 Post-traumatic stress disorder, unspecified: Secondary | ICD-10-CM

## 2015-10-03 DIAGNOSIS — G4700 Insomnia, unspecified: Secondary | ICD-10-CM | POA: Diagnosis not present

## 2015-10-03 DIAGNOSIS — F411 Generalized anxiety disorder: Secondary | ICD-10-CM | POA: Diagnosis not present

## 2015-10-03 DIAGNOSIS — N951 Menopausal and female climacteric states: Secondary | ICD-10-CM | POA: Diagnosis not present

## 2015-10-03 DIAGNOSIS — G43709 Chronic migraine without aura, not intractable, without status migrainosus: Secondary | ICD-10-CM | POA: Diagnosis not present

## 2015-10-03 MED ORDER — PROMETHAZINE HCL 25 MG PO TABS: 25.0000 mg | ORAL_TABLET | Freq: Three times a day (TID) | ORAL | Status: DC | PRN

## 2015-10-03 MED ORDER — RIZATRIPTAN BENZOATE 10 MG PO TBDP: 10.0000 mg | ORAL_TABLET | ORAL | Status: DC | PRN

## 2015-10-03 MED ORDER — BACLOFEN 10 MG PO TABS: 10.0000 mg | ORAL_TABLET | Freq: Three times a day (TID) | ORAL | Status: DC | PRN

## 2015-10-03 MED ORDER — ESCITALOPRAM OXALATE 20 MG PO TABS: ORAL_TABLET | ORAL | Status: DC

## 2015-10-03 NOTE — Patient Instructions (Addendum)
Thank you for coming in to clinic today.  1. It sounds like you are experiencing Peri-menopausal symptoms with irregular and sometimes heavy menstrual bleeding, hot flashes with other symptoms, this will continue to happen intermittently over next several months to 1 year, then some people usually experience improvement with resolved menstrual cycles but may continue to have occasional hot flashes. - No blood test today, as you are in the correct age range for this, and the hormone test may not be helpful to [place]  For migraines - refilled medicines, maxalt and phenergan  For, neck pain muscle spasm - Start taking Baclofen (Lioresal) 10mg  (muscle relaxant) - start with one pill at night as needed for next 1-3 nights (may make you drowsy, caution with driving) see how it affects you, then if tolerated increase to one pill 2 to 3 times a day or (every 8 hours as needed) - It is safe to take Tylenol Ext Str 500mg  tabs - take 1 to 2 (max dose 1000mg ) every 6 hours as needed for breakthrough pain, max 24 hour daily dose is 6 to 8 tablets or 4000mg   For constipation, as discussed - improve hydration, increase fiber, may try miralax over the counter, 1 capful of powder (17g) standard dose, daily for several days to several weeks to normalize bowel movements, goal is 2-3 softs BMs daily if runny or watery then you are using too much, can reduce by half for few days and then stop.  Please schedule a follow-up appointment with Dr Gerlean Ren as needed for Chronic Neck Pain / Perimenopausal Symptoms  If you have any other questions or concerns, please feel free to call the clinic to contact me. You may also schedule an earlier appointment if necessary.  However, if your symptoms get significantly worse, please go to the Emergency Department to seek immediate medical attention.  Kendra Hall, [place]

## 2015-10-03 NOTE — Progress Notes (Signed)
Subjective:    Patient ID: Kendra Hall, female    DOB: March 10, 1966, 50 y.o.   MRN: [id number]  Kendra Hall is a 50 y.o. female presenting on 10/03/2015 for Dizziness; Hot Flashes; Nausea; and Migraine   Patient presents for a same day appointment.   HPI  HOT FLASHES / NAUSEA: - Reports episodes of hot flashes, sweating, nausea, dizziness x 2 episodes, one 1 month ago (out of town in [place]), and last was 3 days ago. Unclear etiology, unprovoked, lasts < 89min self limited. Previously has had some other episodes of dizziness that seem different. - Reports concerns of peri-menopausal symptoms with irregular menstrual cycle over several months now. Last week menstrual cycle started, heavier bleeding, lasting >7 days - asking about blood test for menopause - Denies chest pain, shortness of breath, vomiting, diarrhea, abdominal pain, pre/syncope  Left Neck Muscle Pain, chronic: - Reports chronic pain in this muscle, has previously had trigger point injection 10/2014 without significant relief. Prior trial on muscle relaxants unclear relief, but did get too sedated on flexeril, previously advised to try Baclofen since recent hospitalization 02/2015 but does not recall ever filling the rx.  Migraine, Chronic: - Reports increased frequency in migraine headaches recently. Currently no active migraine headache. Has been taking Maxalt PRN with relief, has needed to use about 3x weekly, requesting refill, currently out. - Also request refill Phenergan PRN nausea, usually associated with migraines, and sometimes above hot flashes  Social History  Substance Use Topics  . Smoking status: Current Every Day Smoker -- 0.25 packs/day    Types: Cigarettes  . Smokeless tobacco: Never Used  . Alcohol Use: Yes     Comment: twice a year    Review of Systems Per HPI unless specifically indicated above     Objective:    BP 162/77 mmHg  Pulse 75  Temp(Src) 98.8 F (37.1 C) (Oral)  Ht 5\' 4"  (1.626 m)   Wt 195 lb 9.6 oz (88.724 kg)  BMI 33.56 kg/m2  Wt Readings from Last 3 Encounters:  10/03/15 195 lb 9.6 oz (88.724 kg)  10/03/15 196 lb 12.8 oz (89.268 kg)  08/22/15 198 lb 12.8 oz (90.175 kg)    Physical Exam  Constitutional: She appears well-developed and well-nourished. No distress.  Well-appearing, comfortable, cooperative  HENT:  Head: Normocephalic and atraumatic.  Mouth/Throat: Oropharynx is clear and moist.  Sinuses non-tender.  Eyes: Conjunctivae and EOM are normal. Pupils are equal, round, and reactive to light.  Neck: Normal range of motion. Neck supple. No thyromegaly present.  Left trapezius near base of neck with spasm and mild tenderness to palpation.  Cardiovascular: Normal rate and intact distal pulses.   Pulmonary/Chest: Effort normal.  Lymphadenopathy:    She has no cervical adenopathy.  Neurological: She is alert.  Skin: Skin is warm and dry. She is not diaphoretic.  Nursing note and vitals reviewed.      Assessment & Plan:   Problem List Items Addressed This Visit    Neck pain    Stable L trapezius spasm, chronic. No injury. No radicular symptoms. Unclear if any significant relief from prior trigger point inj 10/2014  Plan: 1. Trial Baclofen PRN spasms, should be less sedating 5-10mg , previously did not start this med      Relevant Medications   baclofen (LIORESAL) 10 MG tablet   Chronic migraine without aura - Primary    Recent increase migraine headaches, concern if related to perimenopausal changes. No longer adhering to Topamax.  Plan: 1. Refill Rizatriptan PRN migraines 2. Refill Phenergan PRN nausea 3. Advised to take Topamax - follow-up with PCP eval migraines, may need adjusting      Relevant Medications   rizatriptan (MAXALT-MLT) 10 MG disintegrating tablet   promethazine (PHENERGAN) 25 MG tablet   baclofen (LIORESAL) 10 MG tablet    Other Visit Diagnoses    Hot flushes, perimenopausal        Clinically consistent with  perimenopause at age 26, no indication for [facility] or labs today. Considered SNRI but already on SSRI and variety of psych meds.       Meds ordered this encounter  Medications  . rizatriptan (MAXALT-MLT) 10 MG disintegrating tablet    Sig: Take 1 tablet (10 mg total) by mouth as needed for migraine. May repeat in 2 hours if needed    Dispense:  15 tablet    Refill:  1  . promethazine (PHENERGAN) 25 MG tablet    Sig: Take 1 tablet (25 mg total) by mouth every 8 (eight) hours as needed for nausea or vomiting.    Dispense:  30 tablet    Refill:  1  . baclofen (LIORESAL) 10 MG tablet    Sig: Take 1 tablet (10 mg total) by mouth 3 (three) times daily as needed for muscle spasms.    Dispense:  30 each    Refill:  1      Follow up plan: Return in about 3 months (around 01/02/2016), or if symptoms worsen or fail to improve, for neck pain, perimenopausal symptoms.  Nobie Putnam, [place], PGY-3
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
## 2015-10-04 ENCOUNTER — Ambulatory Visit (INDEPENDENT_AMBULATORY_CARE_PROVIDER_SITE_OTHER): Payer: 59 | Admitting: Clinical

## 2015-10-04 ENCOUNTER — Encounter (HOSPITAL_COMMUNITY): Payer: Self-pay | Admitting: Clinical

## 2015-10-04 DIAGNOSIS — F331 Major depressive disorder, recurrent, moderate: Secondary | ICD-10-CM | POA: Diagnosis not present

## 2015-10-04 DIAGNOSIS — F4310 Post-traumatic stress disorder, unspecified: Secondary | ICD-10-CM

## 2015-10-04 NOTE — Assessment & Plan Note (Signed)
Recent increase migraine headaches, concern if related to perimenopausal changes. No longer adhering to Topamax.  Plan: 1. Refill Rizatriptan PRN migraines 2. Refill Phenergan PRN nausea 3. Advised to take Topamax - follow-up with PCP eval migraines, may need adjusting
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
## 2015-10-04 NOTE — Assessment & Plan Note (Signed)
Stable L trapezius spasm, chronic. No injury. No radicular symptoms. Unclear if any significant relief from prior trigger point inj 10/2014  Plan: 1. Trial Baclofen PRN spasms, should be less sedating 5-10mg , previously did not start this med

## 2015-10-05 ENCOUNTER — Telehealth: Payer: Self-pay | Admitting: *Deleted

## 2015-10-05 DIAGNOSIS — G43709 Chronic migraine without aura, not intractable, without status migrainosus: Secondary | ICD-10-CM

## 2015-10-05 NOTE — Telephone Encounter (Signed)
Prior Authorization received from [facility] for Rizatriptan ODT 10 mg.  PA form placed in provider box for completion. Derl Barrow, RN

## 2015-10-05 NOTE — Telephone Encounter (Signed)
PA for faxed to [facility] for review.  Demya Scruggs L, RN  

## 2015-10-05 NOTE — Telephone Encounter (Addendum)
Last seen by me on 10/03/15 for SDA including migraines, requested refill of chronic migraine abortive therapy Rizatriptan ODT.  Completed PA for: Rizatriptan ODT 10mg  #15 per month, +1 refill Indication / Diagnosis: Chronic migraine headaches without aura (ICD10: U7830116), not followed by Neurology, 2 more headaches monthly Date initiated therapy: 03/2011 (previously failed Rizatriptan PO 10/2010) Rationale of Request: Refill due to previously chronically controlled on this medication, also on prophylactic treatments with Topiramate, Anti-depressants, previously failed other Migraine meds (pt is unsure of name, none listed in chart review)  Returned to Latina Craver, RN on 10/05/15 for submission.  Nobie Putnam, [place], PGY-3

## 2015-10-08 NOTE — Addendum Note (Signed)
Addended by: Francee Piccolo A on: 10/08/2015 05:38 PM   Modules accepted: Level of Service

## 2015-10-09 MED ORDER — RIZATRIPTAN BENZOATE 10 MG PO TBDP: 10.0000 mg | ORAL_TABLET | ORAL | Status: DC | PRN

## 2015-10-09 NOTE — Telephone Encounter (Signed)
PA was denied for Rizatriptan via [facility].  Patient can receive #12 tablets per month.  For the quantity prescribed, patient much be followed by neurologist or pain management specialist.  Denial forms placed in provider box for review.  Reference number: [id number].  Derl Barrow, RN'

## 2015-10-09 NOTE — Telephone Encounter (Signed)
Received another PA for Rizatriptan ODT 10 mg; #12 tablets.  Lower cost alts: naratriptan or regular rizatriptan, sumatriptan or Relpax.  Derl Barrow, RN

## 2015-10-09 NOTE — Telephone Encounter (Signed)
Discussed PA denial with Latina Craver RN. New rx (e-script) for Rizatriptan ODT 10mg  tabs dispense #12 (not 15) ordered with +1 refill, since this is max quantity limit for non-neurologist/pain management.  Will forward to PCP for FYI future refills need to be #12 tabs if patient wishes to continue this medication.  Nobie Putnam, [place], PGY-3

## 2015-10-09 NOTE — Addendum Note (Signed)
Addended by: Olin Hauser on: 10/09/2015 11:56 AM   Modules accepted: Orders

## 2015-10-12 ENCOUNTER — Ambulatory Visit (HOSPITAL_COMMUNITY): Payer: Self-pay | Admitting: Clinical

## 2015-10-13 ENCOUNTER — Other Ambulatory Visit: Payer: Self-pay | Admitting: *Deleted

## 2015-10-13 DIAGNOSIS — F411 Generalized anxiety disorder: Secondary | ICD-10-CM

## 2015-10-13 DIAGNOSIS — F333 Major depressive disorder, recurrent, severe with psychotic symptoms: Secondary | ICD-10-CM

## 2015-10-13 DIAGNOSIS — F4310 Post-traumatic stress disorder, unspecified: Secondary | ICD-10-CM

## 2015-10-16 MED ORDER — ESCITALOPRAM OXALATE 20 MG PO TABS: ORAL_TABLET | ORAL | Status: DC

## 2015-10-16 MED ORDER — LEVOTHYROXINE SODIUM 175 MCG PO TABS: 175.0000 ug | ORAL_TABLET | Freq: Every day | ORAL | Status: DC

## 2015-10-24 ENCOUNTER — Ambulatory Visit (INDEPENDENT_AMBULATORY_CARE_PROVIDER_SITE_OTHER): Payer: 59 | Admitting: Clinical

## 2015-10-24 ENCOUNTER — Encounter (HOSPITAL_COMMUNITY): Payer: Self-pay | Admitting: Clinical

## 2015-10-24 DIAGNOSIS — F4310 Post-traumatic stress disorder, unspecified: Secondary | ICD-10-CM | POA: Diagnosis not present

## 2015-10-24 DIAGNOSIS — F331 Major depressive disorder, recurrent, moderate: Secondary | ICD-10-CM | POA: Diagnosis not present

## 2015-10-25 NOTE — Progress Notes (Signed)
   THERAPIST PROGRESS NOTE  Session Time: 7:03 - 7:44  Participation Level: Active  Behavioral Response: CasualAlertDepressed  Type of Therapy: Individual Therapy  Treatment Goals addressed: improve psychiatric symptoms, improve unhelpful thought patterns, emotional regulation ( stress management), , learn about diagnosis,   Interventions: cbt,  motivational interviewing, grounding techniques, psychoeducation  Summary: Kendra Hall is a 50 y.o. female who presents with PTSD and Major Depressive Disorder, recurrent episode.   Suicidal/Homicidal: Nowithout intent/plan  Therapist Response: Kendra Hall met with Kendra Hall for an individual session. Kendra Hall discussed her psychiatric symptoms, current life events, and her goals for therapy. Kendra Hall shared that she was going to have a short session because of challenges with her work schedule. She shared that she has continued to experience depression since her assessment. She shared that she has been conflicted about wanting to get the help she needs and trying to get the kinks worked out  So that she can keep her work happy and take care of her mental health. Kendra Hall introduced Kendra Hall to a grounding technique. Kendra Hall explained the process, purpose and practice of the technique. Kendra Hall and Kendra Hall practiced the technique together. Kendra Hall introduced some basic cbt concepts. Kendra Hall gave Kendra Hall a homework packet (cbt based) on Depression. Kendra Hall agreed to practice the techniques and complete the packet before next session   Plan: Return again in 2 weeks.  Diagnosis: Axis I: PTSD and Major Depressive Disorder, recurrent episode     Juwaun Inskeep A, LCSW 10/25/2015

## 2015-10-31 ENCOUNTER — Ambulatory Visit (HOSPITAL_COMMUNITY): Payer: Self-pay | Admitting: Clinical

## 2015-12-05 ENCOUNTER — Ambulatory Visit (HOSPITAL_COMMUNITY): Payer: Self-pay | Admitting: Clinical

## 2015-12-07 ENCOUNTER — Ambulatory Visit (HOSPITAL_COMMUNITY): Payer: Self-pay | Admitting: Psychiatry

## 2015-12-23 ENCOUNTER — Other Ambulatory Visit: Payer: Self-pay | Admitting: Family Medicine

## 2016-01-01 ENCOUNTER — Encounter (HOSPITAL_COMMUNITY): Payer: Self-pay | Admitting: Clinical

## 2016-01-01 ENCOUNTER — Ambulatory Visit (INDEPENDENT_AMBULATORY_CARE_PROVIDER_SITE_OTHER): Payer: 59 | Admitting: Clinical

## 2016-01-01 DIAGNOSIS — F4310 Post-traumatic stress disorder, unspecified: Secondary | ICD-10-CM | POA: Diagnosis not present

## 2016-01-01 DIAGNOSIS — F331 Major depressive disorder, recurrent, moderate: Secondary | ICD-10-CM | POA: Diagnosis not present

## 2016-01-01 NOTE — Progress Notes (Signed)
   THERAPIST PROGRESS NOTE  Session Time: 7:07 -8:03  Participation Level: Active  Behavioral Response: CasualAlertDepressed and Irritable  Type of Therapy: Individual Therapy  Treatment Goals addressed: improve psychiatric symptoms, elevate mood (increased energy) improve unhelpful thought patterns, learn about diagnosis,  Interventions: Motivational Interviewing, CBT, Grounding and Mindfulness Techniques   Summary: Kendra Hall is a 50 y.o. female who presents with PTSD and Major Depressive Disorder, recurrent episode  Suicidal/Homicidal: No -without intent/plan   Therapist Response:  Leoma met with clinician for an individual session. [name] discussed her psychiatric symptoms, her current life events and her homework. [name] shared that she did not do her depression homework packet. She shared that her medication is working better and that she has been a little bit less angry since last session. She stated that she continues to struggle with the depression. Client and clinician discussed her symptoms and diagnosis. Clinician asked open ended questions and [name] shared that she works from home and hardly leaves her house during the week. She shared that her car broke down and she has not yet been able to get it fixed she has increased her isolation. Clinician asked open ended questions a bout how staying in the house all the time affects her emotions. She shared that it increased her depression. Clinician asked if it was possible for her to walk or if there is other possibilities about getting out of the house. [name] shared that she couldn't walk and that it was likely to  improve her mood. She shared that she was very likely to start walking this week. Client and clinician discussed the depression packet. [name] agreed to complete the packet she has been given and to bring them back with her next session. Client and clinician discussed how our thoughts affect our emotions and our  perceptions. Client and clinician began a discussion a bout how to challenge negative thoughts. Client and clinician discussed grounding and mindfulness techniques. Venessa shared that she had practice the techniques sometimes and that she found them helpful when she did. Clinician encouraged her to practice them daily and also taught her some additional techniques.     Plan: Return again in 2 weeks.  Diagnosis:Axis I: PTSD and Major Depressive Disorder, recurrent episode    Donnamarie Shankles A, LCSW 01/01/2016

## 2016-01-22 ENCOUNTER — Ambulatory Visit: Payer: Self-pay | Admitting: Family Medicine

## 2016-02-01 NOTE — Progress Notes (Signed)
Subjective:     Patient ID: Kendra Hall, female   DOB: July 10, 1965, 50 y.o.   MRN: [id number]  HPI Kendra Hall is a 50 year old female presenting to clinic today to follow-up on diabetes and FMLA paperwork.  # Diabetes: - Has been off of Metformin for several months. - Admits she has not been following her diet like she was supposed to. Has been eating a lot of chips and junk food. Has not been exercising. - A1C. Last 6.7 in 01/2015. - Does not have glucometer. Does not check her blood sugar.  # FMLA: - Previously filled out paperwork for Fibromyalgia - Reports she has had worsening flares of her pain. Now having flares 6x/month, more than initially documented in [facility] paperwork. - Requests updated paperwork. Forgot to bring paperwork with her - States Gabapentin is working well for her pain. Does not wish to increase Gabapentin. Does not wish to initiate another medication.  # Paronychia: - Has noticed increased redness and swelling around the nail of her right 3rd digit - Symptoms started Monday 8/7 - Denies fever, drainage, nausea, vomiting  # Health Maintenance: - Last TSH 02/2016 normal - Last cholesterol 02/2015 - Reports out of most medications. Requests refill of Tessalon, Albuterol, Iron, and Nexium  - Smoker  Review of Systems Per HPI. Other systems negative.    Objective:   Physical Exam  Constitutional: She appears well-developed and well-nourished. No distress.  HENT:  Head: Normocephalic and atraumatic.  Cardiovascular: Normal rate and intact distal pulses.   No murmur heard. Pulmonary/Chest: Effort normal. No respiratory distress. She has no wheezes.  Abdominal: Soft. She exhibits no distension. There is no tenderness.  Musculoskeletal: She exhibits no edema.  Mild edema of right hand 3rd digit, no focal tenderness, ROM symmetric  Skin: No rash noted.  Increased erythema around nail bed of right third digit on hand; no fluctuance noted.  Psychiatric: She has a  normal mood and affect. Her behavior is normal.        Assessment and Plan:     Type 2 diabetes mellitus without complication - [id number] increased to 12.1 today, up from 6.7 at last check - Self-discontinued Metformin. Agrees to restarting. To start 500mg  twice daily for two weeks and then increase to 1000mg  twice daily.  - Refuses initiating other diabetic medications - Prescription for Glucometer given to patient with instructions to check blood sugar twice daily, once before breakfast and once an hour after the largest meal of the day. - Continue to work on diet and exercise. Refuses Dietician consult. - Follow up in 26months  Health care maintenance - Will obtain TSH and Lipid Panel - Tessalon, Iron, Albuterol, and Nexium refilled  Paronychia, right - Recommend soaking - Keflex - Handout given

## 2016-02-02 ENCOUNTER — Other Ambulatory Visit: Payer: Self-pay | Admitting: Family Medicine

## 2016-02-02 ENCOUNTER — Ambulatory Visit (INDEPENDENT_AMBULATORY_CARE_PROVIDER_SITE_OTHER): Payer: 59 | Admitting: Family Medicine

## 2016-02-02 VITALS — BP 141/71 | HR 70 | Temp 98.5°F | Wt 200.0 lb

## 2016-02-02 DIAGNOSIS — L03011 Cellulitis of right finger: Secondary | ICD-10-CM

## 2016-02-02 DIAGNOSIS — Z0000 Encounter for general adult medical examination without abnormal findings: Secondary | ICD-10-CM | POA: Diagnosis not present

## 2016-02-02 DIAGNOSIS — E119 Type 2 diabetes mellitus without complications: Secondary | ICD-10-CM

## 2016-02-02 LAB — POCT GLYCOSYLATED HEMOGLOBIN (HGB A1C): Hemoglobin A1C: 12.1

## 2016-02-02 LAB — LIPID PANEL
Cholesterol: 122 mg/dL — ABNORMAL LOW (ref 125–200)
HDL: 41 mg/dL — ABNORMAL LOW (ref >=46)
LDL Cholesterol: 55 mg/dL (ref <130)
Total CHOL/HDL Ratio: 3 Ratio (ref <=5.0)
Triglycerides: 131 mg/dL (ref <150)
VLDL: 26 mg/dL (ref <30)

## 2016-02-02 LAB — TSH: TSH: 0.65 mIU/L

## 2016-02-02 MED ORDER — CEPHALEXIN 500 MG PO CAPS: 500.0000 mg | ORAL_CAPSULE | Freq: Three times a day (TID) | ORAL | 0 refills | Status: DC

## 2016-02-02 MED ORDER — BLOOD GLUCOSE MONITOR KIT: PACK | 0 refills | Status: DC

## 2016-02-02 NOTE — Patient Instructions (Signed)
Thank you so much for coming to visit me today! -Your A1c was elevated to 12.1 today.Please take metformin 500 mg twice daily. If tolerated over the next 2 weeks she may increase to 2 tablets twice daily. Please continue to work on diet and exercise. Please follow-up in 3 months to recheck her A1c. -We will check your cholesterol and TSH today. You will be contacted with the results. -I have refilled several medications. They should be at the pharmacy.   -I have sent a prescription for Keflex 500 mg 3 times a day for the next 7 days. If your finger does not start to improve please let us know.  Dr. Gerlean Ren  Fingertip Infection When an infection is around the nail, it is called a paronychia. When it appears over the tip of the finger, it is called a felon. These infections are due to minor injuries or cracks in the skin. If they are not treated properly, they can lead to bone infection and permanent damage to the fingernail. Incision and drainage is necessary if a pus pocket (an abscess) has formed. Antibiotics and pain medicine may also be needed. Keep your hand elevated for the next 2-3 days to reduce swelling and pain. If a pack was placed in the abscess, it should be removed in 1-2 days by your caregiver. Soak the finger in warm water for 20 minutes 4 times daily to help promote drainage. Keep the hands as dry as possible. Wear protective gloves with cotton liners. See your caregiver for follow-up care as recommended.  HOME CARE INSTRUCTIONS   Keep wound clean, dry and dressed as suggested by your caregiver.  Soak in warm salt water for fifteen minutes, four times per day for bacterial infections.  Your caregiver will prescribe an antibiotic if a bacterial infection is suspected. Take antibiotics as directed and finish the prescription, even if the problem appears to be improving before the medicine is gone.  Only take over-the-counter or prescription medicines for pain, discomfort, or fever as  directed by your caregiver. SEEK IMMEDIATE MEDICAL CARE IF:  There is redness, swelling, or increasing pain in the wound.  Pus or any other unusual drainage is coming from the wound.  An unexplained oral temperature above 102 F (38.9 C) develops.  You notice a foul smell coming from the wound or dressing. MAKE SURE YOU:   Understand these instructions.  Monitor your condition.  Contact your caregiver if you are getting worse or not improving.   This information is not intended to replace advice given to you by your health care provider. Make sure you discuss any questions you have with your health care provider.   Document Released: 07/18/2004 Document Revised: 09/02/2011 Document Reviewed: 11/28/2014 Elsevier Interactive Patient Education [employer].

## 2016-02-04 DIAGNOSIS — Z0000 Encounter for general adult medical examination without abnormal findings: Secondary | ICD-10-CM | POA: Insufficient documentation

## 2016-02-04 NOTE — Assessment & Plan Note (Addendum)
-   Will obtain TSH and Lipid Panel - Tessalon, Iron, Albuterol, and Nexium refilled

## 2016-02-04 NOTE — Assessment & Plan Note (Signed)
-   A1C increased to 12.1 today, up from 6.7 at last check - Self-discontinued Metformin. Agrees to restarting. To start 500mg  twice daily for two weeks and then increase to 1000mg  twice daily.  - Refuses initiating other diabetic medications - Prescription for Glucometer given to patient with instructions to check blood sugar twice daily, once before breakfast and once an hour after the largest meal of the day. - Continue to work on diet and exercise. Refuses Dietician consult. - Follow up in 2months

## 2016-02-07 ENCOUNTER — Encounter: Payer: Self-pay | Admitting: Family Medicine

## 2016-02-11 ENCOUNTER — Other Ambulatory Visit: Payer: Self-pay

## 2016-02-11 ENCOUNTER — Ambulatory Visit (HOSPITAL_COMMUNITY)
Admission: EM | Admit: 2016-02-11 | Discharge: 2016-02-11 | Disposition: A | Discharge status: Home / Self Care | Payer: 59 | Attending: Internal Medicine | Admitting: Internal Medicine

## 2016-02-11 ENCOUNTER — Encounter (HOSPITAL_COMMUNITY): Payer: Self-pay | Admitting: *Deleted

## 2016-02-11 DIAGNOSIS — R42 Dizziness and giddiness: Secondary | ICD-10-CM | POA: Diagnosis not present

## 2016-02-11 HISTORY — DX: Personal history of irradiation: Z92.3

## 2016-02-11 HISTORY — DX: Transient cerebral ischemic attack, unspecified: G45.9

## 2016-02-11 HISTORY — DX: Gastro-esophageal reflux disease without esophagitis: K21.9

## 2016-02-11 HISTORY — DX: Thyrotoxicosis with diffuse goiter without thyrotoxic crisis or storm: E05.00

## 2016-02-11 NOTE — ED Triage Notes (Signed)
C/O intermittent dizziness over past 8 days since restarting metformin.  Had been having same episodes while taking metformin in past, then discontinued metformin with resolution of dizziness.  9 days ago saw PCP and had Hgb A1C = 12.1; PCP restarted metformin.  1 day after starting metformin, dizzy episodes restarted.  Also c/o intermittent RUE tingling since restarting metformin.  Last night had an episode of substernal chest pain that did not feel like her usual GERD sxs.  Has not had any chest pain today.

## 2016-02-11 NOTE — ED Provider Notes (Signed)
CSN: [id number]     Arrival date & time 02/11/16  1708 History   First MD Initiated Contact with Patient 02/11/16 1729     Chief Complaint  Patient presents with  . Dizziness   (Consider location/radiation/quality/duration/timing/severity/associated sxs/prior Treatment) HPI 50 year old female states that she was started on metformin approximately one week ago 2 days after starting the metformin she became dizzy and has had intermittent dizziness since that time. She states that she had been on metformin once before and had the same symptoms. She states that her hemoglobin A1c at this time is 12. She is currently taking 500 mg of metformin twice daily. She is also had some discomfort in her right arm. Worse with laying on the arm she develops paresthesias but once she moves and releases the arm he feels better. Past Medical History:  Diagnosis Date  . Allergy   . Anxiety   . Asthma   . Depression   . Diabetes mellitus   . Fibromyalgia   . Gastric ulcer   . GERD (gastroesophageal reflux disease)   . Grave's disease   . Graves disease   . H/O therapeutic radiation   . Migraine   . Multiple thyroid nodules   . Myofacial muscle pain   . Osteoarthritis    Facet hypertrophy with injections  . TIA (transient ischemic attack)    02/2015   Past Surgical History:  Procedure Laterality Date  . AXILLARY LYMPH NODE DISSECTION  2001  . HYDRADENITIS EXCISION    . TUBAL LIGATION  1995   Family History  Problem Relation Age of Onset  . Hypertension Mother   . Diabetes Mother   . Cancer Mother     rectal  . Heart disease Father 75    MI x5  . Hypertension Father   . Diabetes Father   . Cancer Sister 59    breast  . Drug abuse Brother   . Alcohol abuse Brother   . Drug abuse Brother   . Alcohol abuse Brother   . Anxiety disorder Neg Hx   . Bipolar disorder Neg Hx   . Depression Neg Hx    Social History  Substance Use Topics  . Smoking status: Current Every Day Smoker    Types:  Cigarettes  . Smokeless tobacco: Never Used  . Alcohol use Yes     Comment: rare   OB History    No data available     Review of Systems  Denies: HEADACHE, NAUSEA, ABDOMINAL PAIN, CHEST PAIN, CONGESTION, DYSURIA, SHORTNESS OF BREATH  Allergies  Erca [ergotamine-caffeine]; Ibuprofen; and Yutopar [ritodrine]  Home Medications   Prior to Admission medications   Medication Sig Start Date End Date Taking? Authorizing Provider  ACCU-CHEK AVIVA PLUS test strip USE TO TEST BLOOD SUGAR TWICE DAILY ONCE IN THE MORNING BEFORE BREAKFAST AND ONCE AN HOUR AFTER LARGEST MEAL OF THE DAY 02/06/16  Yes [place] N Rumley, DO  ACCU-CHEK SOFTCLIX LANCETS lancets USE TO TEST BLOOD SUGAR TWICE DAILY ONCE IN THE MORNING BEFORE BREAKFAST AND ONCE AN HOUR AFTER LARGEST MEAL OF THE DAY 02/06/16  Yes [place] N Rumley, DO  albuterol (PROAIR HFA) 108 (90 BASE) MCG/ACT inhaler Inhale 2 puffs into the lungs every 4 (four) hours as needed for wheezing. 01/25/15  Yes [place] N Rumley, DO  benzonatate (TESSALON) 100 MG capsule TAKE 1 CAPSULE BY MOUTH TWICE DAILY FOR COUGH 12/27/15  Yes [place] N Rumley, DO  blood glucose meter kit and supplies KIT Dispense based on patient  and insurance preference. Use twice daily, once in the morning before breakfast and once an hour after largest meal of the day. Please write down the date, time, and blood sugar level. Please bring blood sugar log to all office visits. (FOR ICD-9 250.00, 250.01). 02/02/16  Yes [place] N Rumley, DO  cephALEXin (KEFLEX) 500 MG capsule Take 1 capsule (500 mg total) by mouth 3 (three) times daily. 02/02/16  Yes [place] N Rumley, DO  escitalopram (LEXAPRO) 20 MG tablet TAKE 1 TABLET(20 MG) BY MOUTH DAILY 10/16/15  Yes [place] N Rumley, DO  esomeprazole (NEXIUM) 20 MG capsule Take 20 mg by mouth daily at 12 noon.   Yes Historical Provider, MD  gabapentin (NEURONTIN) 300 MG capsule Take 2 capsules (600 mg total) by mouth at bedtime. 06/22/15  Yes Alyssa A Haney, MD   levothyroxine (SYNTHROID, LEVOTHROID) 175 MCG tablet Take 1 tablet (175 mcg total) by mouth daily. 10/16/15  Yes [place] N Rumley, DO  metFORMIN (GLUCOPHAGE) 500 MG tablet Take 1 tablet (500 mg total) by mouth 2 (two) times daily with a meal. 03/29/15  Yes Smiley Houseman, MD  promethazine (PHENERGAN) 25 MG tablet Take 1 tablet (25 mg total) by mouth every 8 (eight) hours as needed for nausea or vomiting. 10/03/15  Yes Olin Hauser, MD  rizatriptan (MAXALT-MLT) 10 MG disintegrating tablet Take 1 tablet (10 mg total) by mouth as needed for migraine. May repeat in 2 hours if needed 10/09/15  Yes Olin Hauser, MD  topiramate (TOPAMAX) 25 MG tablet TAKE 1 TABLET BY MOUTH DAILY AT 10:00 PM 05/12/15  Yes [place] N Rumley, DO  ferrous sulfate 325 (65 FE) MG tablet Take 1 tablet (325 mg total) by mouth 3 (three) times daily with meals. Patient not taking: Reported on 02/02/2016 03/31/15   Smiley Houseman, MD   Meds Ordered and Administered this Visit  Medications - No data to display  BP 114/78 (BP Location: Right Arm)   Pulse 81   Temp 98.4 F (36.9 C) (Oral)   Resp 18   LMP 02/02/2016 (Exact Date)   SpO2 99%  No data found.   Physical Exam NURSES NOTES AND VITAL SIGNS REVIEWED. CONSTITUTIONAL: Well developed, well nourished, no acute distress HEENT: normocephalic, atraumatic EYES: Conjunctiva normal NECK:normal ROM, supple, no adenopathy PULMONARY:No respiratory distress, normal effort ABDOMINAL: Soft, ND, NT BS+, No CVAT MUSCULOSKELETAL: Normal ROM of all extremities,  SKIN: warm and dry without rash PSYCHIATRIC: Mood and affect, behavior are normal  Urgent Care Course   Clinical Course    Procedures (including critical care time)  Labs Review Labs Reviewed - No data to display  Imaging Review No results found.   Visual Acuity Review  Right Eye Distance:   Left Eye Distance:   Bilateral Distance:    Right Eye Near:   Left Eye Near:     Bilateral Near:     PA Evaluation of the EKG rate of 74 normal intervals. Normal axis. Normal sinus rhythm without acute changes.   50 year old female with type 2 diabetes taking metformin with side effect of dizziness. She states this has happened before. She is advised not to continue the metformin discussed with her primary care provider other modalities of treatment. MDM   1. Dizziness     Patient is reassured that there are no issues that require transfer to higher level of care at this time or additional tests. Patient is advised to continue home symptomatic treatment. Patient is advised that if there  are new or worsening symptoms to attend the emergency department, contact primary care provider, or return to UC. Instructions of care provided discharged home in stable condition.    THIS NOTE WAS GENERATED USING A VOICE RECOGNITION SOFTWARE PROGRAM. ALL REASONABLE EFFORTS  WERE MADE TO PROOFREAD THIS DOCUMENT FOR ACCURACY.  I have verbally reviewed the discharge instructions with the patient. A printed AVS was given to the patient.  All questions were answered prior to discharge.      Konrad Felix, [place] 02/11/16 2031

## 2016-02-11 NOTE — Discharge Instructions (Signed)
Suggest stopping metformin  Contact your doctor for further change in treatment  Watch diet and sugar intake.

## 2016-02-15 ENCOUNTER — Telehealth: Payer: Self-pay | Admitting: Family Medicine

## 2016-02-15 NOTE — Telephone Encounter (Signed)
Patient calls, seen Dr. Gerlean Ren on 8/11 and placed back on Metformin. Since then, patient has had dizzy spells everyday. She went to [facility] on 8/20 and MD took back off of Metformin and told her to call us to see if she can be placed on something else. She has not had a dizzy spell since discontinuing Metformin. Please advise.

## 2016-02-16 NOTE — Telephone Encounter (Signed)
She may discontinue metformin. I will add it to her list of medication reactions in Epic.

## 2016-02-18 ENCOUNTER — Other Ambulatory Visit: Payer: Self-pay | Admitting: Family Medicine

## 2016-02-19 NOTE — Telephone Encounter (Signed)
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  She stated that she started taking it and then lost the Rx and that the fungus has not gone away.  Forwarding to PCP to be advised. Katharina Caper, April D, [place]

## 2016-02-20 MED ORDER — CEPHALEXIN 500 MG PO CAPS: 500.0000 mg | ORAL_CAPSULE | Freq: Three times a day (TID) | ORAL | 0 refills | Status: DC

## 2016-02-20 MED ORDER — GLIPIZIDE 10 MG PO TABS: 10.0000 mg | ORAL_TABLET | Freq: Two times a day (BID) | ORAL | 3 refills | Status: DC

## 2016-02-20 NOTE — Telephone Encounter (Signed)
Glipizide initiated. She was diagnosed with paronychia at the last office visit and was not treated for fungus. Will represcribe Keflex.

## 2016-02-23 ENCOUNTER — Ambulatory Visit (HOSPITAL_COMMUNITY)
Admission: EM | Admit: 2016-02-23 | Discharge: 2016-02-23 | Disposition: A | Discharge status: Home / Self Care | Payer: 59 | Attending: Family Medicine | Admitting: Family Medicine

## 2016-02-23 ENCOUNTER — Encounter (HOSPITAL_COMMUNITY): Payer: Self-pay | Admitting: *Deleted

## 2016-02-23 DIAGNOSIS — M436 Torticollis: Secondary | ICD-10-CM | POA: Diagnosis not present

## 2016-02-23 MED ORDER — METAXALONE 800 MG PO TABS: 800.0000 mg | ORAL_TABLET | Freq: Four times a day (QID) | ORAL | 0 refills | Status: DC

## 2016-02-23 MED ORDER — BACLOFEN 10 MG PO TABS: 10.0000 mg | ORAL_TABLET | Freq: Three times a day (TID) | ORAL | 0 refills | Status: DC

## 2016-02-23 NOTE — Discharge Instructions (Signed)
Heat, medicine and wear your collar as needed. See your doctor as needed.

## 2016-02-23 NOTE — ED Triage Notes (Signed)
Pt    Woke  Up  In  3  Days   Ago      With  A  Stiff  Neck     denys  Any  specefic  Injury       She  Reports  meds  Not   releived       By  Home  remedys

## 2016-02-23 NOTE — ED Notes (Signed)
Pt  States she  Has  Taken baclofen  For  These  Symptoms  And  It did  Not  Work       Dr  Juventino Slovak  Notified   Wrote  New  RX   For  skelaxen     And   Given to  Pt

## 2016-02-23 NOTE — ED Provider Notes (Signed)
[place]    CSN: [id number] Arrival date & time: 02/23/16  4462  First Provider Contact:  First MD Initiated Contact with Patient 02/23/16 1910        History   Chief Complaint Chief Complaint  Patient presents with  . Torticollis    HPI Kendra Hall is a 50 y.o. female.   The history is provided by the patient.  Neck Injury  This is a recurrent problem. The current episode started more than 2 days ago (slept wrong and awoke with neck spasm., has happened before.). The problem has not changed since onset.Exacerbated by: movement. Nothing relieves the symptoms.    Past Medical History:  Diagnosis Date  . Allergy   . Anxiety   . Asthma   . Depression   . Diabetes mellitus   . Fibromyalgia   . Gastric ulcer   . GERD (gastroesophageal reflux disease)   . Grave's disease   . Graves disease   . H/O therapeutic radiation   . Migraine   . Multiple thyroid nodules   . Myofacial muscle pain   . Osteoarthritis    Facet hypertrophy with injections  . TIA (transient ischemic attack)    02/2015    Patient Active Problem List   Diagnosis Date Noted  . Health care maintenance 02/04/2016  . Severe episode of recurrent major depressive disorder, with psychotic features ([place]) 08/22/2015  . GAD (generalized anxiety disorder) 08/22/2015  . PTSD (post-traumatic stress disorder) 08/22/2015  . Insomnia 08/22/2015  . Dizziness 07/28/2015  . GERD (gastroesophageal reflux disease) 04/18/2015  . Fibromyalgia 03/29/2015  . Slurred speech   . Unilateral weakness   . Type 2 diabetes mellitus without complication ([place])   . TIA (transient ischemic attack) 03/15/2015  . Back pain, acute 03/15/2015  . Neck pain 10/24/2014  . Right knee pain 08/15/2014  . Mild intermittent asthma 02/28/2012  . Anxiety disorder 02/07/2012  . Hypertension 01/07/2012  . Hypothyroidism following radioiodine therapy 08/28/2011  . Grave's disease 05/07/2011  . Family hx-breast malignancy  11/20/2010  . Chronic migraine without aura 05/04/2010  . TOBACCO USER 03/11/2009  . LOW BACK PAIN, CHRONIC 01/09/2009  . OBESITY, UNSPECIFIED 09/27/2008  . Mood disorder ([place]) 09/22/2008    Past Surgical History:  Procedure Laterality Date  . AXILLARY LYMPH NODE DISSECTION  2001  . HYDRADENITIS EXCISION    . TUBAL LIGATION  1995    OB History    No data available       Home Medications    Prior to Admission medications   Medication Sig Start Date End Date Taking? Authorizing Provider  ACCU-CHEK AVIVA PLUS test strip USE TO TEST BLOOD SUGAR TWICE DAILY ONCE IN THE MORNING BEFORE BREAKFAST AND ONCE AN HOUR AFTER LARGEST MEAL OF THE DAY 02/06/16   [place] N Rumley, DO  ACCU-CHEK SOFTCLIX LANCETS lancets USE TO TEST BLOOD SUGAR TWICE DAILY ONCE IN THE MORNING BEFORE BREAKFAST AND ONCE AN HOUR AFTER LARGEST MEAL OF THE DAY 02/06/16   [place] N Rumley, DO  albuterol (PROAIR HFA) 108 (90 BASE) MCG/ACT inhaler Inhale 2 puffs into the lungs every 4 (four) hours as needed for wheezing. 01/25/15   [place] N Rumley, DO  benzonatate (TESSALON) 100 MG capsule TAKE 1 CAPSULE BY MOUTH TWICE DAILY FOR COUGH 12/27/15   [place] N Rumley, DO  blood glucose meter kit and supplies KIT Dispense based on patient and insurance preference. Use twice daily, once in the morning before breakfast and once an hour  after largest meal of the day. Please write down the date, time, and blood sugar level. Please bring blood sugar log to all office visits. (FOR ICD-9 250.00, 250.01). 02/02/16   [place] N Rumley, DO  cephALEXin (KEFLEX) 500 MG capsule Take 1 capsule (500 mg total) by mouth 3 (three) times daily. 02/20/16   [place] N Rumley, DO  escitalopram (LEXAPRO) 20 MG tablet TAKE 1 TABLET(20 MG) BY MOUTH DAILY 10/16/15   [place] N Rumley, DO  esomeprazole (NEXIUM) 20 MG capsule Take 20 mg by mouth daily at 12 noon.    Historical Provider, MD  ferrous sulfate 325 (65 FE) MG tablet Take 1 tablet (325 mg total) by mouth 3  (three) times daily with meals. Patient not taking: Reported on 02/02/2016 03/31/15   Smiley Houseman, MD  gabapentin (NEURONTIN) 300 MG capsule Take 2 capsules (600 mg total) by mouth at bedtime. 06/22/15   Alyssa A Lincoln Brigham, MD  glipiZIDE (GLUCOTROL) 10 MG tablet Take 1 tablet (10 mg total) by mouth 2 (two) times daily before a meal. 02/20/16   [place] N Rumley, DO  levothyroxine (SYNTHROID, LEVOTHROID) 175 MCG tablet Take 1 tablet (175 mcg total) by mouth daily. 10/16/15   [place] N Rumley, DO  promethazine (PHENERGAN) 25 MG tablet Take 1 tablet (25 mg total) by mouth every 8 (eight) hours as needed for nausea or vomiting. 10/03/15   Olin Hauser, DO  rizatriptan (MAXALT-MLT) 10 MG disintegrating tablet Take 1 tablet (10 mg total) by mouth as needed for migraine. May repeat in 2 hours if needed 10/09/15   Olin Hauser, DO  topiramate (TOPAMAX) 25 MG tablet TAKE 1 TABLET BY MOUTH DAILY AT 10:00 PM 05/12/15   Lorna Few, DO    Family History Family History  Problem Relation Age of Onset  . Hypertension Mother   . Diabetes Mother   . Cancer Mother     rectal  . Heart disease Father 57    MI x5  . Hypertension Father   . Diabetes Father   . Cancer Sister 78    breast  . Drug abuse Brother   . Alcohol abuse Brother   . Drug abuse Brother   . Alcohol abuse Brother   . Anxiety disorder Neg Hx   . Bipolar disorder Neg Hx   . Depression Neg Hx     Social History Social History  Substance Use Topics  . Smoking status: Current Every Day Smoker    Types: Cigarettes  . Smokeless tobacco: Never Used  . Alcohol use Yes     Comment: rare     Allergies   Erca [ergotamine-caffeine]; Ibuprofen; Metformin and related; and Yutopar [ritodrine]   Review of Systems Review of Systems  Constitutional: Negative.   HENT: Negative.   Musculoskeletal: Positive for neck pain and neck stiffness.  Neurological: Negative.   All other systems reviewed and are  negative.    Physical Exam Triage Vital Signs ED Triage Vitals [02/23/16 1906]  Enc Vitals Group     BP 133/75     Pulse Rate 84     Resp 12     Temp 98.5 F (36.9 C)     Temp Source Oral     SpO2 99 %     Weight      Height      Head Circumference      Peak Flow      Pain Score      Pain Loc  Pain Edu?      Excl. in [place]?    No data found.   Updated Vital Signs BP 133/75 (BP Location: Right Arm)   Pulse 84   Temp 98.5 F (36.9 C) (Oral)   Resp 12   LMP 02/02/2016 (Exact Date)   SpO2 99%   Visual Acuity Right Eye Distance:   Left Eye Distance:   Bilateral Distance:    Right Eye Near:   Left Eye Near:    Bilateral Near:     Physical Exam  Constitutional: She appears well-developed and well-nourished. No distress.  Neck: Trachea normal and phonation normal. Muscular tenderness present. No spinous process tenderness present. Carotid bruit is present. Decreased range of motion present.    Nursing note and vitals reviewed.    UC Treatments / Results  Labs (all labs ordered are listed, but only abnormal results are displayed) Labs Reviewed - No data to display  EKG  EKG Interpretation None       Radiology No results found.  Procedures Procedures (including critical care time)  Medications Ordered in UC Medications - No data to display   Initial Impression / Assessment and Plan / UC Course  I have reviewed the triage vital signs and the nursing notes.  Pertinent labs & imaging results that were available during my care of the patient were reviewed by me and considered in my medical decision making (see chart for details).  Clinical Course      Final Clinical Impressions(s) / UC Diagnoses   Final diagnoses:  None    New Prescriptions New Prescriptions   No medications on file     Billy Fischer, MD 02/23/16 1925

## 2016-02-25 ENCOUNTER — Other Ambulatory Visit: Payer: Self-pay | Admitting: Family Medicine

## 2016-04-07 NOTE — Progress Notes (Signed)
Subjective:     Patient ID: Kendra Hall, female   DOB: 1966/06/10, 50 y.o.   MRN: [id number]  HPI Kendra Hall is a 50yo presenting today for vaginal bleeding, neck pain, and fibromyalgia  # Vaginal Bleeding: - Reports LMP September 29. Since period ended, she has noted blood from the vagina after bowel movements. Reports she is confident blood is coming from vagina and not rectum. - Denies sexual intercourse over the last two years - Chart review shows history of menometrorrhagia. Believed in past to be secondary to hypothyroidism. [place] negative. Plan to hold endometrial biopsy to see if treatment of endocrine issues improve symptoms. Was noted to return to monthly interval with heavy bleeding in 11/2011.  - Pap Smear in 12/2013 with normal cytology and negative HPV, next due in 12/2018. - TSH normal at 0.65 in 01/2016.  - CBC with hemoglobin 10.4 in 07/2015  # Left Neck/Shoulder Pain: - Pain along left neck and shoulder for the last several weeks - Not improving - Does not get better with Gabapentin - Worse with movement of neck and left armm - Denies chest pain, shortness of breath, diaphoresis - Agreeable to tender point injections today. Phobia of needles. Reports, "We should know that she is really in pain if she is agreeing to a shot."  # Fibromyalgia: - Requested update on FMLA paperwork discussed at last office visit. This office has never received FMLA forms. Contacted office and patient discovered these were never sent to [facility]. - Gabapentin no longer controlling her pain. Interested in increasing dose.  - Smoker  Review of Systems Per HPI    Objective:   Physical Exam  Constitutional: She appears well-developed and well-nourished. No distress.  HENT:  Head: Normocephalic and atraumatic.  Cardiovascular: Normal rate and regular rhythm.   No murmur heard. Pulmonary/Chest: Effort normal. No respiratory distress. She has no wheezes.  Abdominal: Soft. She exhibits no distension.  There is no tenderness.  Genitourinary:  Genitourinary Comments: White vaginal discharge noted. Cervical polyp noted on speculum exam with friability. No vaginal tenderness noted.  Musculoskeletal:       Arms: Psychiatric: She has a normal mood and affect. Her behavior is normal.      Assessment and Plan:     Cervical polyp - Suspected to be contributing to vaginal bleeding , with friability noted on exam. - Will obtain TSH (menstural irregularities noted when TSH abnormal in past) and CBC. - Wet prep also obtained given vaginal discharge. Bacterial vaginosis noted and Metronidazole prescribed to complete 7 day course. Refuses GC/Chlamydia testing since she has not been sexually active for 2 years. - Follow up with [facility] for removal. Plans to send to pathology.  Fibromyalgia - Will increase Gabapentin to 600mg  twice daily. At past visits was noted to be well controlled with Gabapentin. - Will watch for FMLA paperwork. Not yet received.  Left Neck/Shoulder Pain: - Tender Point injection given. See procedure note below. Immediate relief of pain reported.  PROCEDURE NOTE: Procedure: Left Trapezius Tenderpoint Injection Consent obtained prior to procedure Area sterilized with alcohol x3 1% Lidocaine without Epi injected into four previously determined tenderpoints No complications noted.

## 2016-04-08 ENCOUNTER — Encounter: Payer: Self-pay | Admitting: Family Medicine

## 2016-04-08 ENCOUNTER — Ambulatory Visit (INDEPENDENT_AMBULATORY_CARE_PROVIDER_SITE_OTHER): Payer: 59 | Admitting: Family Medicine

## 2016-04-08 VITALS — BP 122/86 | HR 79 | Temp 98.6°F | Wt 199.6 lb

## 2016-04-08 DIAGNOSIS — E038 Other specified hypothyroidism: Secondary | ICD-10-CM

## 2016-04-08 DIAGNOSIS — N898 Other specified noninflammatory disorders of vagina: Secondary | ICD-10-CM

## 2016-04-08 DIAGNOSIS — M25512 Pain in left shoulder: Secondary | ICD-10-CM | POA: Diagnosis not present

## 2016-04-08 DIAGNOSIS — N841 Polyp of cervix uteri: Secondary | ICD-10-CM | POA: Diagnosis not present

## 2016-04-08 DIAGNOSIS — M797 Fibromyalgia: Secondary | ICD-10-CM | POA: Diagnosis not present

## 2016-04-08 LAB — POCT WET PREP (WET MOUNT)
Clue Cells Wet Prep Whiff POC: POSITIVE
Trichomonas Wet Prep HPF POC: ABSENT

## 2016-04-08 LAB — TSH: TSH: 0.05 mIU/L — ABNORMAL LOW

## 2016-04-08 LAB — CBC
HCT: 38.2 % (ref 35.0–45.0)
Hemoglobin: 12 g/dL (ref 11.7–15.5)
MCH: 23.3 pg — ABNORMAL LOW (ref 27.0–33.0)
MCHC: 31.4 g/dL — ABNORMAL LOW (ref 32.0–36.0)
MCV: 74.3 fL — ABNORMAL LOW (ref 80.0–100.0)
MPV: 9.8 fL (ref 7.5–12.5)
Platelets: 307 10*3/uL (ref 140–400)
RBC: 5.14 MIL/uL — ABNORMAL HIGH (ref 3.80–5.10)
RDW: 17.9 % — ABNORMAL HIGH (ref 11.0–15.0)
WBC: 7.7 10*3/uL (ref 3.8–10.8)

## 2016-04-08 MED ORDER — GABAPENTIN 300 MG PO CAPS: 600.0000 mg | ORAL_CAPSULE | Freq: Two times a day (BID) | ORAL | 3 refills | Status: DC

## 2016-04-08 NOTE — Patient Instructions (Addendum)
Thank you so much for coming today! The vaginal bleeding is likely due to a cervical polyp. Please return to [facility] here at [facility] for removal. They will send it to pathology to make sure it is a polyp. You may try salonpas patches on your shoulder. Please call the office over the next week if you would still like to increase your Gabapentin. I will look for the FMLA paperwork.  Dr. Gerlean Ren

## 2016-04-09 LAB — IGG, IGA, IGM
IgA: 272 mg/dL (ref 81–463)
IgG (Immunoglobin G), Serum: 1187 mg/dL (ref 694–1618)
IgM, Serum: 150 mg/dL (ref 48–271)

## 2016-04-09 MED ORDER — METRONIDAZOLE 500 MG PO TABS: 500.0000 mg | ORAL_TABLET | Freq: Two times a day (BID) | ORAL | 0 refills | Status: DC

## 2016-04-10 DIAGNOSIS — N841 Polyp of cervix uteri: Secondary | ICD-10-CM | POA: Insufficient documentation

## 2016-04-10 NOTE — Assessment & Plan Note (Addendum)
-   Suspected to be contributing to vaginal bleeding , with friability noted on exam. - Will obtain TSH (menstural irregularities noted when TSH abnormal in past) and CBC. - Wet prep also obtained given vaginal discharge. Bacterial vaginosis noted and Metronidazole prescribed to complete 7 day course. Refuses GC/Chlamydia testing since she has not been sexually active for 2 years. - Follow up with [facility] for removal. Plans to send to pathology.

## 2016-04-10 NOTE — Assessment & Plan Note (Signed)
-   Will increase Gabapentin to 600mg  twice daily. At past visits was noted to be well controlled with Gabapentin. - Will watch for FMLA paperwork. Not yet received.

## 2016-04-14 ENCOUNTER — Other Ambulatory Visit: Payer: Self-pay | Admitting: Family Medicine

## 2016-04-17 ENCOUNTER — Telehealth: Payer: Self-pay | Admitting: Family Medicine

## 2016-04-17 NOTE — Telephone Encounter (Signed)
Pt states her employer faxed over [place] papers on Tuesday Oct 17.  Pt wants to know the status of the paperwork. Please advise

## 2016-04-22 ENCOUNTER — Other Ambulatory Visit: Payer: Self-pay | Admitting: Family Medicine

## 2016-04-22 MED ORDER — LEVOTHYROXINE SODIUM 150 MCG PO TABS: 150.0000 ug | ORAL_TABLET | Freq: Every day | ORAL | 3 refills | Status: DC

## 2016-04-22 NOTE — Telephone Encounter (Signed)
They should be ready for her to pick up tomorrow morning (04/23/16)

## 2016-04-23 NOTE — Telephone Encounter (Signed)
FMLA paper filled out and faxed. Copy left at front desk for Mrs. Thakker and copy scanned into [employer].  Will decrease Synthroid to 155mcg. Recheck TSH in 6 weeks. Recommend having T3 and T4 checked when she presents for Colposcopy clinic.   Concerned about being on period during cervical polyp removal. Discussed that this could still be done. Patient may reschedule if she is anxious about having procedure during period.

## 2016-04-24 ENCOUNTER — Other Ambulatory Visit: Payer: Self-pay | Admitting: Family Medicine

## 2016-04-24 NOTE — Telephone Encounter (Signed)
Confirmed with pt that she was aware of the message from her PCP below. Katharina Caper, Abijah Roussel D, [place]

## 2016-04-25 ENCOUNTER — Ambulatory Visit (INDEPENDENT_AMBULATORY_CARE_PROVIDER_SITE_OTHER): Payer: 59 | Admitting: Family Medicine

## 2016-04-25 VITALS — BP 120/77 | HR 74 | Temp 98.2°F | Ht 64.0 in | Wt 202.0 lb

## 2016-04-25 DIAGNOSIS — N920 Excessive and frequent menstruation with regular cycle: Secondary | ICD-10-CM

## 2016-04-25 DIAGNOSIS — N841 Polyp of cervix uteri: Secondary | ICD-10-CM | POA: Diagnosis not present

## 2016-04-25 MED ORDER — ACETAMINOPHEN 500 MG PO TABS
1000.0000 mg | ORAL_TABLET | Freq: Once | ORAL | Status: AC
  Administered 2016-04-25: 1000 mg via ORAL

## 2016-04-25 NOTE — Progress Notes (Addendum)
PRE-OP DIAGNOSIS: Menorrhagia and cervical polyps                                      Patient just completing her period. She had large clots in her underwear during menses. She has been having issues with excessive bleeding for years.  POST-OP DIAGNOSIS: Same   PROCEDURE: Polypectomy  Performing Physician: Andrena Mews, MD, MPH  Supervising Physician (if applicable): NA  Physical Exam  Genitourinary: Vagina normal. There is no rash, tenderness, lesion or injury on the right labia. There is no rash, tenderness, lesion or injury on the left labia. Cervix exhibits no discharge and no friability.          PROCEDURE:   Informed written and verbal consent obtained.  Procedure discussed in detail with possible complications.   Procedure area was prepped , topical 5 mL of 1% lidocaine with epinephrine used for topical anesthesia.  Cervix was cleaned with povidone iodine  Fleshy polyps protruding from her cervical os was clamped with ring forceps. The forceps was used to twist out the cervical polyps. Protruding polyps was removed with small stump deep in the cervical os.  Complication: Very minimal bleeding.  A/P:  Cervical Polyps: Polypectomy completed.                            Monsol's solution applied which completely stopped her bleeding.                            Tylenol given for pain.                            Specimen sent to lab for pathology.                             Return precaution discussed.  Menorrhagia: Chronic and recurrent.                       Note reviewed, seems to be ongoing since 2015.                       Since she has residual polyp stump in her cervical OS endometrial biopsy was not done.                       Pelvic U/S ordered.                       Will call with result when available.  F/U with PCP for other health concern.

## 2016-04-25 NOTE — Patient Instructions (Signed)
It was nice seeing you today. We removed your polyps. However there is still a stump deep in your cervix. Specimen sent to lab for testing. We will obtain pelvic U/S to further assess your bleeding.

## 2016-04-26 ENCOUNTER — Telehealth: Payer: Self-pay | Admitting: Family Medicine

## 2016-04-26 NOTE — Telephone Encounter (Signed)
Pt called and needs to speak to Dr. Gerlean Ren about the [facility] papers that were filled out. She said her employer need some additional  information. Please call ASAP. jw

## 2016-04-26 NOTE — Telephone Encounter (Signed)
Reports she was contacted by human resources concerning FMLA paperwork. They requested #7 be readdressed concerning number of office visits. Discussed that Fibromyalgia flares will not always require a visit. Form no longer in Batch Scanning pile and not yet scanned to medial in Epic. Will monitor for it to be scanned in and then address.

## 2016-04-30 ENCOUNTER — Telehealth: Payer: Self-pay | Admitting: Family Medicine

## 2016-04-30 NOTE — Telephone Encounter (Signed)
Discussed her cervical biopsy report with her. All questions were answered. She stated she is now having vaginal discharge and still bleeds. Bleeding is not new. She is asking to get hysterectomy done. Patient advised to come see her PCP soon for Gyn referral. She also has pelvic U/S scheduled for 05/02/16, this will help guide [place] towards the cause of her bleeding.   For her vaginal discharge and bleeding, I have scheduled an appointment with her PCP this Friday at 4:15pm. Patient was initially advised to come in today for vaginitis assessment, but she stated she is unable to come in till Thursday or Friday.   F/U sooner if symptoms worsens. Will forward message to her PCP as well.

## 2016-05-02 ENCOUNTER — Ambulatory Visit (HOSPITAL_COMMUNITY)
Admission: RE | Admit: 2016-05-02 | Discharge: 2016-05-02 | Disposition: A | Discharge status: Home / Self Care | Payer: 59 | Source: Ambulatory Visit | Attending: Family Medicine | Admitting: Family Medicine

## 2016-05-02 ENCOUNTER — Telehealth: Payer: Self-pay | Admitting: Family Medicine

## 2016-05-02 DIAGNOSIS — D250 Submucous leiomyoma of uterus: Secondary | ICD-10-CM | POA: Diagnosis not present

## 2016-05-02 DIAGNOSIS — N939 Abnormal uterine and vaginal bleeding, unspecified: Secondary | ICD-10-CM | POA: Insufficient documentation

## 2016-05-02 DIAGNOSIS — N920 Excessive and frequent menstruation with regular cycle: Secondary | ICD-10-CM

## 2016-05-02 NOTE — Telephone Encounter (Signed)
I called her and discussed her U/S result with her.She will benefit from endometrial biopsy since she continues to bleed intermittently. This was not done during last visit since she has cervical polyps which seems to be coming from her uterus.  As discussed with her, she will benefit from Gyn referral to discuss hysterectomy or further evaluation. She has appointment with her PCP tomorrow. PCP to refer to Gyn.   IMPRESSION: 1. Suspect endometrial polyp. Consider further evaluation with sonohysterogram for confirmation prior to hysteroscopy. Endometrial sampling should also be considered if patient is at high risk for endometrial carcinoma. (Ref: Radiological Reasoning: Algorithmic Workup of Abnormal Vaginal Bleeding with Endovaginal Sonography and Sonohysterography. AJR 2008; [id number]) 2. Small posterior submucosal fibroid.

## 2016-05-03 ENCOUNTER — Ambulatory Visit (INDEPENDENT_AMBULATORY_CARE_PROVIDER_SITE_OTHER): Payer: 59 | Admitting: Family Medicine

## 2016-05-03 ENCOUNTER — Encounter: Payer: Self-pay | Admitting: Family Medicine

## 2016-05-03 VITALS — BP 130/83 | HR 75 | Temp 98.2°F | Ht 64.0 in | Wt 204.2 lb

## 2016-05-03 DIAGNOSIS — M797 Fibromyalgia: Secondary | ICD-10-CM

## 2016-05-03 DIAGNOSIS — N939 Abnormal uterine and vaginal bleeding, unspecified: Secondary | ICD-10-CM

## 2016-05-03 DIAGNOSIS — E038 Other specified hypothyroidism: Secondary | ICD-10-CM

## 2016-05-03 DIAGNOSIS — E119 Type 2 diabetes mellitus without complications: Secondary | ICD-10-CM | POA: Diagnosis not present

## 2016-05-03 LAB — T4, FREE: Free T4: 1.5 ng/dL (ref 0.8–1.8)

## 2016-05-03 LAB — T3, FREE: T3, Free: 2.9 pg/mL (ref 2.3–4.2)

## 2016-05-03 LAB — POCT GLYCOSYLATED HEMOGLOBIN (HGB A1C): Hemoglobin A1C: 11.6

## 2016-05-03 MED ORDER — PREGABALIN 75 MG PO CAPS: 75.0000 mg | ORAL_CAPSULE | Freq: Two times a day (BID) | ORAL | 0 refills | Status: DC

## 2016-05-03 NOTE — Patient Instructions (Signed)
Thank you so much for coming to visit today! We will inject your right trapezius today. Please stop taking Gabapentin. We will start Lyrica twice daily. We can titrate this up if needed. Ob/Gyn Referral has been placed. You should hear from them soon. Please schedule an appointment for diabetes.  Dr. Gerlean Ren

## 2016-05-04 ENCOUNTER — Other Ambulatory Visit: Payer: Self-pay | Admitting: Family Medicine

## 2016-05-04 DIAGNOSIS — G43709 Chronic migraine without aura, not intractable, without status migrainosus: Secondary | ICD-10-CM

## 2016-05-05 DIAGNOSIS — N939 Abnormal uterine and vaginal bleeding, unspecified: Secondary | ICD-10-CM | POA: Insufficient documentation

## 2016-05-05 NOTE — Assessment & Plan Note (Signed)
-   A1C elevated. Encouraged to follow up for diabetes appointment soon so this can be addressed.

## 2016-05-05 NOTE — Progress Notes (Signed)
Subjective:     Patient ID: Kendra Hall, female   DOB: 04/20/1966, 50 y.o.   MRN: [id number]  HPI Kendra Hall is a 50yo female presenting today for Ob/Gyn referral and right sided neck pain.  # Vaginal Bleeding - Presented on 10/16 to discuss vaginal bleeding. Noted since period ended on September 29, she had been having vaginal bleeding following bowel movements. Pelvic exam showed cervical polyp with friability. Returned on 04/25/16 for polypectomy. Per Dr. Gwendlyn Deutscher, polyp seemed to be coming from further inside the uterus. Pelvic US ordered and showed endometrial polyp and small posterior fibroid. - Interested in hysterectomy. Would like referral to Ob/Gyn to discuss hysterectomy and other options.  # Right Neck Pain: - Reports pain along right side of neck into right shoulder. Is not in shoulder joint itself. Similar to pain in left shoulder previously. Would like trigger point injection. - Reports Gabapentin is no longer working and would like to try another agent. Reports she was unable to tolerate Amitriptyline. Has tried Flexeril in the past without relief. - Will send FMLA paperwork to be filled out  Review of Systems Per HPI    Objective:   Physical Exam  Constitutional: She appears well-developed and well-nourished. No distress.  HENT:  Head: Normocephalic and atraumatic.  Eyes:  Pink conjunctiva  Cardiovascular: Normal rate and regular rhythm.   No murmur heard. Pulmonary/Chest: Effort normal. No respiratory distress. She has no wheezes.  Musculoskeletal:       Right shoulder: She exhibits normal range of motion and no effusion.       Arms: Skin: No rash noted.  Psychiatric: She has a normal mood and affect. Her behavior is normal.      Assessment and Plan:     Vaginal bleeding - Pelvic US with endometrial polyp and small posterior fibroid - Ob/Gyn referral placed to discuss hysterectomy vs. Other options  Fibromyalgia - Gabapentin discontinued. Lyrica initiated.  Has failed Amitriptyline and Flexeril in the past. - Trigger point injection of right trapezius with immediate relief of pain - Will await FMLA paperwork  PROCEDURE NOTE: Written consent obtained after discussion of risks and benefits  Area sterilized with betadine x2 and alcohol  Lidocaine without epinephrine injected to 5 tender points along right trapezius. 5cc injected in total with approximately 1cc in each point. No complications noted. Reported immediate relief of pain.  Type 2 diabetes mellitus without complication - [id number] elevated. Encouraged to follow up for diabetes appointment soon so this can be addressed.

## 2016-05-05 NOTE — Assessment & Plan Note (Signed)
-   Gabapentin discontinued. Lyrica initiated. Has failed Amitriptyline and Flexeril in the past. - Trigger point injection of right trapezius with immediate relief of pain - Will await FMLA paperwork  PROCEDURE NOTE: Written consent obtained after discussion of risks and benefits  Area sterilized with betadine x2 and alcohol  Lidocaine without epinephrine injected to 5 tender points along right trapezius. 5cc injected in total with approximately 1cc in each point. No complications noted. Reported immediate relief of pain.

## 2016-05-05 NOTE — Assessment & Plan Note (Signed)
-   Pelvic US with endometrial polyp and small posterior fibroid - Ob/Gyn referral placed to discuss hysterectomy vs. Other options

## 2016-05-06 ENCOUNTER — Encounter: Payer: Self-pay | Admitting: Family Medicine

## 2016-05-07 ENCOUNTER — Telehealth: Payer: Self-pay | Admitting: *Deleted

## 2016-05-07 ENCOUNTER — Telehealth: Payer: Self-pay | Admitting: Family Medicine

## 2016-05-07 NOTE — Telephone Encounter (Signed)
Prior Authorization received from [place] for Lyrica 75 mg capsule. PA form placed in provider box for completion. Derl Barrow, RN

## 2016-05-07 NOTE — Telephone Encounter (Signed)
Routing to PCP to be advised. Katharina Caper, April D, [place]

## 2016-05-07 NOTE — Telephone Encounter (Signed)
Pt called again, very frustrated we weren't able to fax anything today. Please advise. Thanks! ep

## 2016-05-07 NOTE — Telephone Encounter (Signed)
PA form faxed to [employer] for review.  The review process could take 24-72 hours to complete.  Derl Barrow, RN

## 2016-05-07 NOTE — Telephone Encounter (Signed)
FMLA papers are due today, pt would like someone to fax a note saying Dr. Gerlean Ren is not here today and needs an extension. [phone], Attn: Selena Please advise. Thanks! ep

## 2016-05-08 NOTE — Telephone Encounter (Signed)
After reviewing previous phone notes was able to locate the forms in the faxed papers.  Pulled form made copies and placed in PCP box for completion.  If this is something that cant be completed today then please see note about sending a request for an extension.  Please advise. Katharina Caper, Jalessa Peyser D, [place]

## 2016-05-08 NOTE — Telephone Encounter (Signed)
Pt has been informed. Sharon T Saunders, CMA  

## 2016-05-08 NOTE — Telephone Encounter (Signed)
Paperwork already filled out and returned to [facility]. Contacted and they have received.

## 2016-05-09 ENCOUNTER — Other Ambulatory Visit: Payer: Self-pay | Admitting: Family Medicine

## 2016-05-09 NOTE — Telephone Encounter (Signed)
Called [employer] PA status.  PA is still in the review process.  Derl Barrow, RN

## 2016-05-10 NOTE — Telephone Encounter (Signed)
Called [employer] for status of PA. PA is still in review process.  Derl Barrow, RN

## 2016-05-13 ENCOUNTER — Other Ambulatory Visit (HOSPITAL_COMMUNITY): Payer: Self-pay | Admitting: Psychiatry

## 2016-05-13 NOTE — Telephone Encounter (Signed)
PA still pending per [employer].  Derl Barrow, RN

## 2016-05-15 NOTE — Telephone Encounter (Signed)
PA for Lyrica is still in the review process per [employer].  Derl Barrow, RN

## 2016-05-20 NOTE — Telephone Encounter (Signed)
PA for Lyrica denied via [employer].  Reference number: [id number].  Denial placed in provider box for review.  Derl Barrow, RN

## 2016-05-21 ENCOUNTER — Telehealth: Payer: Self-pay | Admitting: Family Medicine

## 2016-05-21 NOTE — Telephone Encounter (Signed)
Pt states prior authorization for Lyrica was denied and would like Dr. Gerlean Ren to call insurance company to do a peer to peer. Pt would also like Dr. Gerlean Ren to call her regarding FMLA. Please advise. Thanks! ep

## 2016-05-23 ENCOUNTER — Ambulatory Visit: Payer: Self-pay | Admitting: Women's Health

## 2016-05-23 ENCOUNTER — Other Ambulatory Visit: Payer: Self-pay | Admitting: Family Medicine

## 2016-05-24 ENCOUNTER — Other Ambulatory Visit: Payer: Self-pay | Admitting: Student in an Organized Health Care Education/Training Program

## 2016-05-24 ENCOUNTER — Encounter: Payer: Self-pay | Admitting: Student in an Organized Health Care Education/Training Program

## 2016-05-24 ENCOUNTER — Ambulatory Visit (INDEPENDENT_AMBULATORY_CARE_PROVIDER_SITE_OTHER): Payer: 59 | Admitting: Student in an Organized Health Care Education/Training Program

## 2016-05-24 ENCOUNTER — Encounter: Payer: Self-pay | Admitting: Women's Health

## 2016-05-24 ENCOUNTER — Ambulatory Visit (INDEPENDENT_AMBULATORY_CARE_PROVIDER_SITE_OTHER): Payer: 59 | Admitting: Women's Health

## 2016-05-24 ENCOUNTER — Telehealth: Payer: Self-pay | Admitting: Family Medicine

## 2016-05-24 VITALS — BP 126/80 | Wt 202.0 lb

## 2016-05-24 DIAGNOSIS — L080 Pyoderma: Secondary | ICD-10-CM | POA: Diagnosis not present

## 2016-05-24 DIAGNOSIS — N840 Polyp of corpus uteri: Secondary | ICD-10-CM | POA: Diagnosis not present

## 2016-05-24 DIAGNOSIS — N920 Excessive and frequent menstruation with regular cycle: Secondary | ICD-10-CM

## 2016-05-24 DIAGNOSIS — M797 Fibromyalgia: Secondary | ICD-10-CM

## 2016-05-24 MED ORDER — NORTRIPTYLINE HCL 25 MG PO CAPS: 75.0000 mg | ORAL_CAPSULE | Freq: Every day | ORAL | 0 refills | Status: DC

## 2016-05-24 NOTE — Progress Notes (Signed)
Kendra Hall 04-04-1966 [id number]    History:    Presents for new patient with a problem annual exam. Was found to have a benign endocervical polyp at primary care, ultrasound 05/07/2016 questionable endometrial polyp, endometrium 10 mm. Normal Pap and mammogram history. Monthly cycle. Has not been sexually active for greater than 3 years. History of heavy 7 day menstrual cycles changing protection every 1-2 hours but in the last year has noticed vaginal bleeding with bowel movements. Has several other health problems including diabetes, hypothyroidism, migraines, fibromyalgia, peptic ulcers. Mother colon cancer age 38 deceased. Sister breast cancer deceased age 88, BRCA status unknown.   Past medical history, past surgical history, family history and social history were all reviewed and documented in the EPIC chart. Has 4 children youngest 82 all doing well. Works for [employer] in [employer], art. Father renal and heart disease.  ROS:  A ROS was performed and pertinent positives and negatives are included.  Exam:  Vitals:   05/24/16 1045  BP: 126/80  Weight: 202 lb (91.6 kg)   Body mass index is 34.67 kg/m.   General appearance:  Normal Thyroid:  Symmetrical, normal in size, without palpable masses or nodularity. Respiratory  Auscultation:  Clear without wheezing or rhonchi Cardiovascular  Auscultation:  Regular rate, without rubs, murmurs or gallops  Edema/varicosities:  Not grossly evident Abdominal  Soft,nontender, without masses, guarding or rebound.  Liver/spleen:  No organomegaly noted  Hernia:  None appreciated  Skin  Inspection:  Grossly normal   Breasts: Examined lying and sitting.     Right: Without masses, retractions, discharge or axillary adenopathy.     Left: Without masses, retractions, discharge or axillary adenopathy. Gentitourinary   Inguinal/mons:  Normal without inguinal adenopathy  External genitalia:  Normal  BUS/Urethra/Skene's glands:   Normal  Vagina:  Normal  Cervix:  Normal  Uterus:  normal in size, shape and contour.  Midline and mobile  Adnexa/parametria:     Rt: Without masses or tenderness.   Lt: Without masses or tenderness.  Anus and perineum: Normal  Digital rectal exam: Normal sphincter tone without palpated masses or tenderness  Assessment/Plan:  50 y.o. SBF G4 P4  for annual exam.   Probable endometrial polyp noted on ultrasound 05/07/2016 Monthly 7 day cycles with menorrhagia Hypothyroidism/diabetes/migraines/ulcers-primary care manages labs and meds  Plan: Schedule sonohysterogram with biopsy with Dr. Toney Rakes after next cycle.  Schedule colonoscopy, [facility] information given and reviewed. Overdue for mammogram, instructed to schedule 3-D, breast center information given. Exercise, calcium rich diet, vitamin D 1000 daily encouraged. Encouraged to decrease calories, carbs and increase exercise for diabetes control and weight loss. Pap normal with negative HR HPV 2015, new screening guidelines reviewed. Denies need for contraception or STD screen.    Kendra Hall WHNP, 2:03 PM 05/24/2016

## 2016-05-24 NOTE — Patient Instructions (Addendum)
It was a pleasure seeing you today in our clinic. Today we discussed fibromyaglia. Here is the treatment plan we have discussed and agreed upon together:  - Warm compresses for 10 minutes 3 times daily for the sores on your chest - Follow up with Dr. Gerlean Ren for your visit on the 11th - Start with the new medication, nortriptyline at 25 mg (1 pill) tonight, and gradually titrate up to 75 mg at bedtime over the course of this week.  Our clinic's number is [phone]. Please call with questions or concerns about what we discussed today.  Be well, Dr. Burr Medico

## 2016-05-24 NOTE — Telephone Encounter (Signed)
Will forward to PCP.  Martin, Tamika L, RN  

## 2016-05-24 NOTE — Telephone Encounter (Signed)
Appt today for medication reaction with Dr. Burr Medico at 3 PM.  Derl Barrow, RN

## 2016-05-24 NOTE — Telephone Encounter (Signed)
Pt states Lyrica breaking out (acne) and it is painful. Pt would like to try something else. Pt also stated she needs FMLA appointment time changed, doesn't know If she can do an hour. Please advise. Thanks! ep

## 2016-05-24 NOTE — Progress Notes (Signed)
   CC: vesicular rash  HPI: Kendra Hall is a 50 y.o. female who presents to [facility] today with a vesicular/pustular rash of 5 days duration which she feels is secondary to starting new drug, lexapro.  Patient was recently seen on 11/10 for fibromyalgia and prescribed lexapro.  She had previously failed on gabapentin and amitriptyline therapy. - had been unable to obtain lexapro due to the cost, ended up paying $500 for the medication and was not reimbursed by insurance - she started the medication on 11/20, however developed vesicles on her chest shortly after taking it, which she feels are related to the medication - she continued taking the medication because she paid so much for it, and the vesicles became more painful - she has had acne in this area on her chest before but never so painful - she denies fevers, denies drainage or erythema around the vesicles - denies any other rashes or lesions   Review of Symptoms:  See HPI for ROS.   CC, SH/smoking status, and VS noted.  Objective: BP 110/70   Pulse 80   Temp 98.1 F (36.7 C) (Oral)   Ht 5\' 4"  (1.626 m)   Wt 91.6 kg (202 lb)   LMP 04/21/2016   SpO2 97%   BMI 34.67 kg/m  GEN: NAD, alert, cooperative, and pleasant. Skin: +8-10 vesicles and pustules over anterior chest wall consistent with acne, does not appear to be dermatomal, no pustular collection suggesting drainable abscess, no redness or warmth or drainage, no hives. Rash is acutely tender to palpation  Assessment and plan:  Pustular rash Consistent with acne, and patient has had acne in this area in the past. No abscess or drainable collection, no signs of infection or cellulitis - pain to palpation consistent with fibromyalgia trigger pain - patient already stopped lexapro, though this rash is not convincingly due to the medication - recommend warm compresses  Fibromyalgia Patient previously failed gabapentin and amitriptyline therapy. Fibromyalgia trigger points are  between her shoulder blades and on her arms. Also suspect some of the chest rash tenderness is related to fibromyalgia. - offered higher dose of gabapentin, however patient is sure this medication does not help  - will try nortriptyline, start at 25 mg and titrate to 75 mg QHS over the first week of treatment with close follow up (patient has PCP appointment made already) - informed patient this medication can be sedating    No orders of the defined types were placed in this encounter.   Meds ordered this encounter  Medications  . nortriptyline (PAMELOR) 25 MG capsule    Sig: Take 3 capsules (75 mg total) by mouth at bedtime.    Dispense:  45 capsule    Refill:  0     Everrett Coombe, MD,MS,  PGY1 05/25/2016 8:34 PM

## 2016-05-24 NOTE — Patient Instructions (Addendum)
[facility]  Dr Carlean Purl  [phone] Dysfunctional Uterine Bleeding Introduction Dysfunctional uterine bleeding is abnormal bleeding from the uterus. Dysfunctional uterine bleeding includes:  A period that comes earlier or later than usual.  A period that is lighter, heavier, or has blood clots.  Bleeding between periods.  Skipping one or more periods.  Bleeding after sexual intercourse.  Bleeding after menopause. Follow these instructions at home: Pay attention to any changes in your symptoms. Follow these instructions to help with your condition: Eating and drinking  Eat well-balanced meals. Include foods that are high in iron, such as liver, meat, shellfish, green leafy vegetables, and eggs.  If you become constipated:  Drink plenty of water.  Eat fruits and vegetables that are high in water and fiber, such as spinach, carrots, raspberries, apples, and mango. Medicines  Take over-the-counter and prescription medicines only as told by your health care provider.  Do not change medicines without talking with your health care provider.  Aspirin or medicines that contain aspirin may make the bleeding worse. Do not take those medicines:  During the week before your period.  During your period.  If you were prescribed iron pills, take them as told by your health care provider. Iron pills help to replace iron that your body loses because of this condition. Activity  If you need to change your sanitary pad or tampon more than one time every 2 hours:  Lie in bed with your feet raised (elevated).  Place a cold pack on your lower abdomen.  Rest as much as possible until the bleeding stops or slows down.  Do not try to lose weight until the bleeding has stopped and your blood iron level is back to normal. Other Instructions  For two months, write down:  When your period starts.  When your period ends.  When any abnormal bleeding occurs.  What problems you  notice.  Keep all follow up visits as told by your health care provider. This is important. Contact a health care provider if:  You get light-headed or weak.  You have nausea and vomiting.  You cannot eat or drink without vomiting.  You feel dizzy or have diarrhea while you are taking medicines.  You are taking birth control pills or hormones, and you want to change them or stop taking them. Get help right away if:  You develop a fever or chills.  You need to change your sanitary pad or tampon more than one time per hour.  Your bleeding becomes heavier, or your flow contains clots more often.  You develop pain in your abdomen.  You lose consciousness.  You develop a rash. This information is not intended to replace advice given to you by your health care provider. Make sure you discuss any questions you have with your health care provider. Document Released: 06/07/2000 Document Revised: 11/16/2015 Document Reviewed: 09/05/2014  2017 Elsevier   Breast center [phone]

## 2016-05-25 DIAGNOSIS — L080 Pyoderma: Secondary | ICD-10-CM | POA: Insufficient documentation

## 2016-05-25 NOTE — Assessment & Plan Note (Addendum)
Consistent with acne, and patient has had acne in this area in the past. No abscess or drainable collection, no signs of infection or cellulitis - pain to palpation consistent with fibromyalgia trigger pain - patient already stopped lexapro, though this rash is not convincingly due to the medication - recommend warm compresses

## 2016-05-25 NOTE — Assessment & Plan Note (Addendum)
Patient previously failed gabapentin and amitriptyline therapy. Fibromyalgia trigger points are between her shoulder blades and on her arms. Also suspect some of the chest rash tenderness is related to fibromyalgia. - offered higher dose of gabapentin, however patient is sure this medication does not help  - will try nortriptyline, start at 25 mg and titrate to 75 mg QHS over the first week of treatment with close follow up (patient has PCP appointment made already) - informed patient this medication can be sedating

## 2016-06-03 ENCOUNTER — Encounter: Payer: Self-pay | Admitting: Family Medicine

## 2016-06-03 ENCOUNTER — Ambulatory Visit (INDEPENDENT_AMBULATORY_CARE_PROVIDER_SITE_OTHER): Payer: 59 | Admitting: Family Medicine

## 2016-06-03 DIAGNOSIS — E119 Type 2 diabetes mellitus without complications: Secondary | ICD-10-CM | POA: Diagnosis not present

## 2016-06-03 MED ORDER — CANAGLIFLOZIN 100 MG PO TABS: 100.0000 mg | ORAL_TABLET | Freq: Every day | ORAL | 3 refills | Status: DC

## 2016-06-03 NOTE — Progress Notes (Signed)
Subjective:     Patient ID: Kendra Hall, female   DOB: 04-Sep-1965, 50 y.o.   MRN: [id number]  HPI Mrs. Limone is a 50yo female presenting today for diabetes management and FMLA. # Diabetes: A1C noted to be 11.6 on 05/03/2016 and requested to return for diabetes management visit. Reports she is currently taking Glipizide, however admits she often forgets to take doses. Metformin was discontinued due to dizziness. Continues to refuse to start insulin or any other injectable medications, however willing to try additional oral medications. Reports she has been working on her diet, eating green vegetables and avoiding starches. Does not check blood sugars consistently. Refuses nutrition consult. Amenable to following up with Dr. Valentina Lucks.   #FMLA: Requests another addendum of FMLA paperwork. States she was only given an hour for office visits, which is not enough time for her to come to the office for full evaluation. States she often schedules multiple doctor visits in a single day and requests 8hours for office visit.  Smoker  Review of Systems Per HPI     Objective:   Physical Exam  Constitutional: She is oriented to person, place, and time. She appears well-developed and well-nourished. No distress.  HENT:  Head: Normocephalic and atraumatic.  Mouth/Throat: No oropharyngeal exudate.  Cardiovascular: Normal rate and regular rhythm.   No murmur heard. Pedal pulses palpable  Pulmonary/Chest: Effort normal. No respiratory distress. She has no wheezes.  Abdominal: Soft. She exhibits no distension. There is no tenderness.  Musculoskeletal: She exhibits no edema.  Neurological: She is alert and oriented to person, place, and time.  Sensation intact over feet bilaterally  Skin:  No callouses noted       Assessment and Plan:     Type 2 diabetes mellitus without complication [id number] on 05/03/16. Continue Glipizide. Refusing to initiate any injectable medications including insulin. Will  initiate Invokana--discussed that this may improve her A1C but is unlikely to completely help her reach her goal. Refuses nutrition. Continue to work on diet and exercise. Follow up with Pharmacy to discuss diabetes management. Return in 3 months--will recheck A1C at that visit.

## 2016-06-03 NOTE — Patient Instructions (Addendum)
Thank you so much for coming to visit today! Your A1C was 11.6 at your last office visit. We will continue your Glipizide and add Invokana on once a day. I suspect your will need injectable diabetes medication if no improvement. Please continue to work on your diet and exercise. Follow up with Dr. Valentina Lucks, our pharmacist. Dennis Bast may also follow up with me in 3 months.  Dr. Gerlean Ren  Canagliflozin oral tablets What is this medicine? CANAGLIFLOZIN (KAN a gli FLOE zin) helps to treat type 2 diabetes. It helps to control blood sugar. Treatment is combined with diet and exercise. COMMON BRAND NAME(S): Invokana What should I tell my health care provider before I take this medicine? They need to know if you have any of these conditions: -dehydration -diabetic ketoacidosis -diet low in salt -eating less due to illness, surgery, dieting, or any other reason -having surgery -high cholesterol -high levels of potassium in the blood -history of pancreatitis or pancreas problems -history of yeast infection of the penis or vagina -if you often drink alcohol -infections in the bladder, kidneys, or urinary tract -kidney disease -liver disease -low blood pressure -on hemodialysis -problems urinating -type 1 diabetes -uncircumcised female -an unusual or allergic reaction to canagliflozin, other medicines, foods, dyes, or preservatives -pregnant or trying to get pregnant -breast-feeding How should I use this medicine? Take this medicine by mouth with a glass of water. Follow the directions on the prescription label. Take it before the first meal of the day. Take your dose at the same time each day. Do not take more often than directed. Do not stop taking except on your doctor's advice. A special MedGuide will be given to you by the pharmacist with each prescription and refill. Be sure to read this information carefully each time. Talk to your pediatrician regarding the use of this medicine in children. Special  care may be needed. What if I miss a dose? If you miss a dose, take it as soon as you can. If it is almost time for your next dose, take only that dose. Do not take double or extra doses. What may interact with this medicine? Do not take this medicine with any of the following medications: -gatifloxacin This medicine may also interact with the following medications: -alcohol -certain medicines for blood pressure, heart disease -digoxin -diuretics -insulin -nateglinide -phenobarbital -phenytoin -repaglinide -rifampin -ritonavir -sulfonylureas like glimepiride, glipizide, glyburide What should I watch for while using this medicine? Visit your doctor or health care professional for regular checks on your progress. This medicine can cause a serious condition in which there is too much acid in the blood. If you develop nausea, vomiting, stomach pain, unusual tiredness, or breathing problems, stop taking this medicine and call your doctor right away. If possible, use a ketone dipstick to check for ketones in your urine. A test called the HbA1C (A1C) will be monitored. This is a simple blood test. It measures your blood sugar control over the last 2 to 3 months. You will receive this test every 3 to 6 months. Learn how to check your blood sugar. Learn the symptoms of low and high blood sugar and how to manage them. Always carry a quick-source of sugar with you in case you have symptoms of low blood sugar. Examples include hard sugar candy or glucose tablets. Make sure others know that you can choke if you eat or drink when you develop serious symptoms of low blood sugar, such as seizures or unconsciousness. They must get medical  help at once. Tell your doctor or health care professional if you have high blood sugar. You might need to change the dose of your medicine. If you are sick or exercising more than usual, you might need to change the dose of your medicine. Do not skip meals. Ask your  doctor or health care professional if you should avoid alcohol. Many nonprescription cough and cold products contain sugar or alcohol. These can affect blood sugar. Wear a medical ID bracelet or chain, and carry a card that describes your disease and details of your medicine and dosage times. What side effects may I notice from receiving this medicine? Side effects that you should report to your doctor or health care professional as soon as possible: -allergic reactions like skin rash, itching or hives, swelling of the face, lips, or tongue -breathing problems -chest pain -dizziness -fast or irregular heartbeat -feeling faint or lightheaded, falls -muscle weakness -nausea, vomiting, unusual stomach upset or pain -new pain or tenderness, change in skin color, sores or ulcers, or infection in legs or feet -signs and symptoms of low blood sugar such as feeling anxious, confusion, dizziness, increased hunger, unusually weak or tired, sweating, shakiness, cold, irritable, headache, blurred vision, fast heartbeat, loss of consciousness -signs and symptoms of a urinary tract infection, such as fever, chills, a burning feeling when urinating, blood in the urine, back pain -trouble passing urine or change in the amount of urine, including an urgent need to urinate more often, in larger amounts, or at night -penile discharge, itching, or pain in men -unusual tiredness -vaginal discharge, itching, or odor in women Side effects that usually do not require medical attention (report to your doctor or health care professional if they continue or are bothersome): -constipation -mild increase in urination -thirsty Where should I keep my medicine? Keep out of the reach of children. Store at room temperature between 20 and 25 degrees C (68 and 77 degrees F). Throw away any unused medicine after the expiration date.  2017 Elsevier/Gold Standard (2015-07-31 12:47:25)

## 2016-06-03 NOTE — Assessment & Plan Note (Signed)
A1C 11.6 on 05/03/16. Continue Glipizide. Refusing to initiate any injectable medications including insulin. Will initiate Invokana--discussed that this may improve her A1C but is unlikely to completely help her reach her goal. Refuses nutrition. Continue to work on diet and exercise. Follow up with Pharmacy to discuss diabetes management. Return in 3 months--will recheck A1C at that visit.

## 2016-06-05 ENCOUNTER — Other Ambulatory Visit: Payer: Self-pay | Admitting: Family Medicine

## 2016-06-06 ENCOUNTER — Encounter: Payer: Self-pay | Admitting: Family Medicine

## 2016-06-06 NOTE — Progress Notes (Signed)
Contacted [employer] concerning correcting FMLA paperwork. Case number [id number] given. Representative ask that I speak with [place], contact number [phone]. Attempted to contact x2 without response. Message left with fax and phone information.

## 2016-06-13 ENCOUNTER — Other Ambulatory Visit: Payer: Self-pay | Admitting: Family Medicine

## 2016-06-25 ENCOUNTER — Other Ambulatory Visit: Payer: Self-pay | Admitting: Gynecology

## 2016-06-25 DIAGNOSIS — N939 Abnormal uterine and vaginal bleeding, unspecified: Secondary | ICD-10-CM

## 2016-06-25 DIAGNOSIS — N9489 Other specified conditions associated with female genital organs and menstrual cycle: Secondary | ICD-10-CM

## 2016-06-26 ENCOUNTER — Ambulatory Visit: Payer: 59 | Admitting: Gynecology

## 2016-06-26 ENCOUNTER — Other Ambulatory Visit: Payer: 59

## 2016-06-28 ENCOUNTER — Ambulatory Visit (INDEPENDENT_AMBULATORY_CARE_PROVIDER_SITE_OTHER): Payer: 59

## 2016-06-28 ENCOUNTER — Ambulatory Visit (INDEPENDENT_AMBULATORY_CARE_PROVIDER_SITE_OTHER): Payer: 59 | Admitting: Family Medicine

## 2016-06-28 ENCOUNTER — Encounter: Payer: Self-pay | Admitting: Family Medicine

## 2016-06-28 ENCOUNTER — Ambulatory Visit (INDEPENDENT_AMBULATORY_CARE_PROVIDER_SITE_OTHER): Payer: 59 | Admitting: Gynecology

## 2016-06-28 ENCOUNTER — Other Ambulatory Visit: Payer: Self-pay | Admitting: Family Medicine

## 2016-06-28 ENCOUNTER — Encounter: Payer: Self-pay | Admitting: Gynecology

## 2016-06-28 VITALS — BP 122/76 | HR 86 | Temp 98.2°F | Ht 63.5 in | Wt 207.6 lb

## 2016-06-28 DIAGNOSIS — N9489 Other specified conditions associated with female genital organs and menstrual cycle: Secondary | ICD-10-CM | POA: Diagnosis not present

## 2016-06-28 DIAGNOSIS — N840 Polyp of corpus uteri: Secondary | ICD-10-CM | POA: Diagnosis not present

## 2016-06-28 DIAGNOSIS — E890 Postprocedural hypothyroidism: Secondary | ICD-10-CM

## 2016-06-28 DIAGNOSIS — N920 Excessive and frequent menstruation with regular cycle: Secondary | ICD-10-CM

## 2016-06-28 DIAGNOSIS — J4520 Mild intermittent asthma, uncomplicated: Secondary | ICD-10-CM

## 2016-06-28 DIAGNOSIS — E119 Type 2 diabetes mellitus without complications: Secondary | ICD-10-CM

## 2016-06-28 DIAGNOSIS — M797 Fibromyalgia: Secondary | ICD-10-CM

## 2016-06-28 DIAGNOSIS — Z800 Family history of malignant neoplasm of digestive organs: Secondary | ICD-10-CM

## 2016-06-28 DIAGNOSIS — N939 Abnormal uterine and vaginal bleeding, unspecified: Secondary | ICD-10-CM | POA: Diagnosis not present

## 2016-06-28 MED ORDER — ALBUTEROL SULFATE HFA 108 (90 BASE) MCG/ACT IN AERS: 2.0000 | INHALATION_SPRAY | RESPIRATORY_TRACT | 5 refills | Status: DC | PRN

## 2016-06-28 NOTE — Progress Notes (Signed)
   Patient is a 51 year old who presented to the office today as part of her evaluation for her menorrhagia. She was seen by our nurse practitioner Ms. Elon Alas on December 1 and she has stated the patient had a benign endocervical polyp seen at the primary care office and an ultrasound on November 14}? A possibility of endometrial polyps since her endometrium was 10 mm in thickness. She reports having normal menstrual cycles heavy lasting 7-8 days she's had a previous tubal ligation the past. So she's here for further evaluation. Patient did have a normal Pap smear in 2015.  Ultrasound sonohysterogram today: Uterus 9.1 x 6.1 x 4.9 cm with an endometrial stripe of 7 mm. 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 Right thick walled follicle 8 mm was noted left ovary was normal. No fluid in the cul-de-sac. The cervix is then cleansed with Betadine solution a sterile catheter was introduced into the uterine cavity and normal saline was instilled and no intracavitary defect was noted. Following this a sterile Pipelle was introduced into the uterine cavity whereby an endometrial biopsy was obtained and tissue submitted for histological evaluation.  51 year old with prior tubal ligation with menorrhagia ideal candidate for outpatient office endometrial ablation such as with the her option technique for which literature information was provided. Normal ultrasound today with exception may very small intramural myoma patient ideal candidate for this procedure. We did do an endometrial biopsy results pending at time of this dictation. A biopsy normal we'll proceed with scheduling for procedure.

## 2016-06-28 NOTE — Patient Instructions (Signed)
Thank you so much for coming to visit today! We will check your thyroid level today. I will let you know if further adjustments to your medications need to be made. I have refilled your albuterol. I will keep an eye out for the FMLA forms and fax them back. I will leave a copy for you at the front desk.  Please increase your Nortriptyline to three tablets at night. You may also try heat, ice, and Salonpas patches for relief. If no relief over the next several weeks, please let me know and we can try an injection. Please return in February to recheck your A1C and adjust your diabetes medication.  Dr. Gerlean Ren

## 2016-06-28 NOTE — Progress Notes (Signed)
Subjective:     Patient ID: Kendra Hall, female   DOB: May 30, 1966, 51 y.o.   MRN: [id number]  HPI Kendra Hall is a 51yo female presenting today for follow up of multiple concerns.  # Fibromyalgia/Shoulder Pain: History of Fibromyalgia, currently on Nortriptyline which seems to help. Currently only taking 50mg  even though prescribed 75mg . Reports 50mg  dosage is helping, but she feels like it is not helping as much as it initially did. Has had Lidocaine injections of bilateral trapezius muscles and considering it today, however would like to try increasing Nortriptyline to 75mg  as prescribed and then return for injection if it doesn't help. Also frustrated that [facility] paperwork has not been completed yet. Discussed that requests for forms has been sent to company, but none have been received. Called and again requested for forms with patient in room.   # Hypothyroidism: History of hypothyroidism. Last TSH in 03/2016 low at 0.05. Synthroid decreased to 158mcg at that time. Due for repeat TSH to determine if further adjustments to Synthroid needed.   # Diabetes: Last A1C 11.6 in 04/2016. Currently on Glipizide and Invokana. Reports she has gotten better about checking her blood sugar, but forgot her log. Lowest blood sugar 143, highest 309 since last visit. Unable to give average of blood sugars.   Also of note, requests refill of Albuterol. Smoker  Review of Systems Per HPI.    Objective:   Physical Exam  Constitutional: She appears well-developed and well-nourished. No distress.  HENT:  Head: Normocephalic and atraumatic.  Neck: Neck supple. No thyromegaly present.  Cardiovascular: Normal rate and regular rhythm.   No murmur heard. Pulmonary/Chest: Effort normal. No respiratory distress. She has no wheezes.  Abdominal: Soft. She exhibits no distension. There is no tenderness.  Musculoskeletal: She exhibits no edema.  Tenderness over bilateral trapezius, right worse than left.   Psychiatric: She has a normal mood and affect. Her behavior is normal.      Assessment and Plan:     Mild intermittent asthma - Stable. Albuterol refill given.  Hypothyroidism following radioiodine therapy Recheck TSH. Adjust Synthroid (currently at 150mg  daily) pending results  Type 2 diabetes mellitus without complication Continue Glipizide and Invokana. Follow up in 07/2016 for A1C  Fibromyalgia Increased dose of Nortriptyline to 75mg  daily, which is what is prescribed. May also try over the counter medications, such as Salonpas patches, as well has heat/ice. If no improvement, will return for Lidocaine tender point injections of bilateral trapezius.

## 2016-06-29 NOTE — Assessment & Plan Note (Signed)
Continue Glipizide and Invokana. Follow up in 07/2016 for A1C

## 2016-06-29 NOTE — Assessment & Plan Note (Signed)
Increased dose of Nortriptyline to 75mg  daily, which is what is prescribed. May also try over the counter medications, such as Salonpas patches, as well has heat/ice. If no improvement, will return for Lidocaine tender point injections of bilateral trapezius.

## 2016-06-29 NOTE — Assessment & Plan Note (Signed)
-   Stable. Albuterol refill given.

## 2016-06-29 NOTE — Assessment & Plan Note (Signed)
Recheck TSH. Adjust Synthroid (currently at 150mg  daily) pending results

## 2016-07-03 ENCOUNTER — Telehealth: Payer: Self-pay | Admitting: Family Medicine

## 2016-07-03 NOTE — Telephone Encounter (Signed)
Patient has an abscess on her labia, and she will wanting to know what she can do to ease the pain. Please advise.

## 2016-07-04 NOTE — Telephone Encounter (Signed)
Forwarded to PCP for advising. Kendra Hall S Rufina Kimery, CMA  

## 2016-07-05 ENCOUNTER — Telehealth: Payer: Self-pay

## 2016-07-05 DIAGNOSIS — N921 Excessive and frequent menstruation with irregular cycle: Secondary | ICD-10-CM

## 2016-07-05 NOTE — Telephone Encounter (Signed)
Recommend she come for office visit to be evaluated. Otherwise, may try warm compresses and over the counter pain medication.

## 2016-07-05 NOTE — Telephone Encounter (Signed)
You sent me order her Her Option Ablation for patient.  I called her. LMP 06/21/16 and prior to that she had not had a period since 02/2016.  She has no idea when next period will be.  She is currently Day 14 and by the time I can get her scheduled she could be Day 30 or 40.  Should I just tell her next time menses begins call me Day One and I will find a way to get her scheduled in appropriate time frame?

## 2016-07-05 NOTE — Telephone Encounter (Signed)
I don't understand if her last menstrual period was December 29th  Why cant we start with her Prometrium now in scheduling 2 weeks?

## 2016-07-05 NOTE — Telephone Encounter (Signed)
Super! Thank you.

## 2016-07-05 NOTE — Telephone Encounter (Signed)
Contacted pt and gave her the below information and she said that the abscess had burst and drained and was better.  She also wanted to know if Dr. Gerlean Ren had received her FMLA papers to be filled out. Told pt I would route to PCP to see if she had received these. Katharina Caper, April D, [place]

## 2016-07-05 NOTE — Telephone Encounter (Signed)
If that is fine with you I will have her start Prometrium on 07/11/16 and schedule her for 07/25/16 (first available).

## 2016-07-09 ENCOUNTER — Telehealth: Payer: Self-pay | Admitting: Family Medicine

## 2016-07-09 ENCOUNTER — Other Ambulatory Visit: Payer: Self-pay | Admitting: Gynecology

## 2016-07-09 ENCOUNTER — Telehealth: Payer: Self-pay

## 2016-07-09 ENCOUNTER — Other Ambulatory Visit: Payer: Self-pay | Admitting: Family Medicine

## 2016-07-09 MED ORDER — LEVOTHYROXINE SODIUM 125 MCG PO TABS: 125.0000 ug | ORAL_TABLET | Freq: Every day | ORAL | 0 refills | Status: DC

## 2016-07-09 MED ORDER — PROGESTERONE MICRONIZED 200 MG PO CAPS: 200.0000 mg | ORAL_CAPSULE | Freq: Every day | ORAL | 0 refills | Status: DC

## 2016-07-09 NOTE — Telephone Encounter (Signed)
TSH remains low at 0.144. Will decrease Synthroid further to 161mcg. Return in 8 weeks to repeat TSH and further adjust if needed.  Received blank FMLA form from [employer]. Contacted to resend last FMLA form filled out to ensure there are no discrepancies to further complicate matter. Will fill out FMLA paperwork when able and fax back when able. Will notify patient when forms are complete and leave copy for her at front desk.  Attempted to contact patient concerning this information without response x2. Will forward to nursing to continue to attempt to contact.

## 2016-07-09 NOTE — Telephone Encounter (Signed)
Patient is scheduled for Her Option ablation on 07/25/16. I explained she would need someone to drive her to and from the procedure. She asked if she could use an [place]?

## 2016-07-09 NOTE — Telephone Encounter (Signed)
Left message to call me to confirm date and firm up plans.

## 2016-07-09 NOTE — Telephone Encounter (Signed)
Encounter already opened. 

## 2016-07-09 NOTE — Telephone Encounter (Signed)
Yes

## 2016-07-09 NOTE — Telephone Encounter (Signed)
Message was given to pt. She says her nortriptyline has dried out her mouth so much the corners of her mouth are cracked and sore.  Please advise

## 2016-07-09 NOTE — Telephone Encounter (Signed)
Patient called. Her ablation Is scheduled for 07/25/16 at 9:00am. She was instructed regarding full bladder for procedure. Instructions will be mailed as well. She was advised to have someone drive her to and from surgery as she will not be able to drive herself. She was advised of need for appt day before for laminary and pre op visit with Dr. Moshe Salisbury. Appt is scheduled 1/31 3:30pm.  We previously discussed her ins benefits and her estimated surgery prepayment that will be due at pre op visit and I reminded her of that.   Prometrium was sent to pharmacy and I reminded her she will need to start that on 1/18 and take it at bedtime every night until ablation.

## 2016-07-12 NOTE — Telephone Encounter (Signed)
Left message for patient "[name] is ok".

## 2016-07-13 ENCOUNTER — Encounter (HOSPITAL_COMMUNITY): Payer: Self-pay | Admitting: Emergency Medicine

## 2016-07-13 DIAGNOSIS — E1165 Type 2 diabetes mellitus with hyperglycemia: Secondary | ICD-10-CM | POA: Insufficient documentation

## 2016-07-13 DIAGNOSIS — Z79899 Other long term (current) drug therapy: Secondary | ICD-10-CM | POA: Insufficient documentation

## 2016-07-13 DIAGNOSIS — I10 Essential (primary) hypertension: Secondary | ICD-10-CM | POA: Diagnosis not present

## 2016-07-13 DIAGNOSIS — J45909 Unspecified asthma, uncomplicated: Secondary | ICD-10-CM | POA: Diagnosis not present

## 2016-07-13 DIAGNOSIS — Z87891 Personal history of nicotine dependence: Secondary | ICD-10-CM | POA: Insufficient documentation

## 2016-07-13 DIAGNOSIS — Z7984 Long term (current) use of oral hypoglycemic drugs: Secondary | ICD-10-CM | POA: Diagnosis not present

## 2016-07-13 DIAGNOSIS — Z8673 Personal history of transient ischemic attack (TIA), and cerebral infarction without residual deficits: Secondary | ICD-10-CM | POA: Diagnosis not present

## 2016-07-13 DIAGNOSIS — E039 Hypothyroidism, unspecified: Secondary | ICD-10-CM | POA: Insufficient documentation

## 2016-07-13 LAB — CBC WITH DIFFERENTIAL/PLATELET
Basophils Absolute: 0 10*3/uL (ref 0.0–0.1)
Basophils Relative: 0 %
Eosinophils Absolute: 0.2 10*3/uL (ref 0.0–0.7)
Eosinophils Relative: 2 %
HCT: 42.2 % (ref 36.0–46.0)
Hemoglobin: 13.9 g/dL (ref 12.0–15.0)
Lymphocytes Relative: 33 %
Lymphs Abs: 2.8 10*3/uL (ref 0.7–4.0)
MCH: 25.5 pg — ABNORMAL LOW (ref 26.0–34.0)
MCHC: 32.9 g/dL (ref 30.0–36.0)
MCV: 77.3 fL — ABNORMAL LOW (ref 78.0–100.0)
Monocytes Absolute: 0.5 10*3/uL (ref 0.1–1.0)
Monocytes Relative: 7 %
Neutro Abs: 4.9 10*3/uL (ref 1.7–7.7)
Neutrophils Relative %: 58 %
Platelets: 319 10*3/uL (ref 150–400)
RBC: 5.46 MIL/uL — ABNORMAL HIGH (ref 3.87–5.11)
RDW: 15.9 % — ABNORMAL HIGH (ref 11.5–15.5)
WBC: 8.3 10*3/uL (ref 4.0–10.5)

## 2016-07-13 LAB — URINALYSIS, ROUTINE W REFLEX MICROSCOPIC
Bilirubin Urine: NEGATIVE
Glucose, UA: >=500 mg/dL — AB
Hgb urine dipstick: NEGATIVE
Ketones, ur: NEGATIVE mg/dL
Leukocytes, UA: NEGATIVE
Nitrite: NEGATIVE
Protein, ur: NEGATIVE mg/dL
Specific Gravity, Urine: 1.033 — ABNORMAL HIGH (ref 1.005–1.030)
pH: 5 (ref 5.0–8.0)

## 2016-07-13 LAB — CBG MONITORING, ED: Glucose-Capillary: 297 mg/dL — ABNORMAL HIGH (ref 65–99)

## 2016-07-13 NOTE — ED Triage Notes (Signed)
Pt. reports elevated blood sugar today =576 with fatigue , generalized weakness and headache . Denies fever or chills .

## 2016-07-14 ENCOUNTER — Emergency Department (HOSPITAL_COMMUNITY)
Admission: EM | Admit: 2016-07-14 | Discharge: 2016-07-14 | Disposition: A | Discharge status: Home / Self Care | Payer: 59 | Attending: Emergency Medicine | Admitting: Emergency Medicine

## 2016-07-14 DIAGNOSIS — R739 Hyperglycemia, unspecified: Secondary | ICD-10-CM

## 2016-07-14 LAB — COMPREHENSIVE METABOLIC PANEL
ALT: 32 U/L (ref 14–54)
AST: 26 U/L (ref 15–41)
Albumin: 3.8 g/dL (ref 3.5–5.0)
Alkaline Phosphatase: 126 U/L (ref 38–126)
Anion gap: 12 (ref 5–15)
BUN: 10 mg/dL (ref 6–20)
CO2: 25 mmol/L (ref 22–32)
Calcium: 9.5 mg/dL (ref 8.9–10.3)
Chloride: 97 mmol/L — ABNORMAL LOW (ref 101–111)
Creatinine, Ser: 0.91 mg/dL (ref 0.44–1.00)
GFR calc Af Amer: >60 mL/min (ref >60)
GFR calc non Af Amer: >60 mL/min (ref >60)
Glucose, Bld: 317 mg/dL — ABNORMAL HIGH (ref 65–99)
Potassium: 3.9 mmol/L (ref 3.5–5.1)
Sodium: 134 mmol/L — ABNORMAL LOW (ref 135–145)
Total Bilirubin: 0.2 mg/dL — ABNORMAL LOW (ref 0.3–1.2)
Total Protein: 7.6 g/dL (ref 6.5–8.1)

## 2016-07-14 LAB — CBG MONITORING, ED: Glucose-Capillary: 230 mg/dL — ABNORMAL HIGH (ref 65–99)

## 2016-07-14 NOTE — Discharge Instructions (Signed)
The blood glucose was noted to come down to a more manageable level while here in the ED. Please continue with your previously prescribed medication and diet regimen. Follow-up with your primary care provider as soon as possible. Return to the ED should any worrisome symptoms arise.

## 2016-07-14 NOTE — ED Provider Notes (Signed)
[facility] DEPT Provider Note   CSN: [id number] Arrival date & time: 07/13/16  2301     History   Chief Complaint Chief Complaint  Patient presents with  . Hyperglycemia    HPI Kendra Hall is a 51 y.o. female.  HPI   Kendra Hall is a 51 y.o. female, with a history of DM and Graves' disease, presenting to the ED with hyperglycemia noticed yesterday,  576 at around 8pm last evening. Pt states she doesn't normally check her BG, even though she is supposed to check it daily. She decided to check it because she "didn't feel right." Pt drank some vinegar water and her glucose went down to 417. Last food intake was right before the initial BG. Endorses previous nausea, but denies current complaints. Last saw her PCP this month, but no changes were made to her diabetes medications.  Denies vomiting, fever/chills, shortness of breath, dizziness, or any other complaints.   Past Medical History:  Diagnosis Date  . Allergy   . Anxiety   . Asthma   . Depression   . Diabetes mellitus   . Fibromyalgia   . Gastric ulcer   . GERD (gastroesophageal reflux disease)   . Grave's disease   . Graves disease   . H/O therapeutic radiation   . Migraine   . Multiple thyroid nodules   . Myofacial muscle pain   . Osteoarthritis    Facet hypertrophy with injections  . TIA (transient ischemic attack)    02/2015    Patient Active Problem List   Diagnosis Date Noted  . Health care maintenance 02/04/2016  . Severe episode of recurrent major depressive disorder, with psychotic features ([place]) 08/22/2015  . GAD (generalized anxiety disorder) 08/22/2015  . PTSD (post-traumatic stress disorder) 08/22/2015  . Insomnia 08/22/2015  . GERD (gastroesophageal reflux disease) 04/18/2015  . Fibromyalgia 03/29/2015  . Type 2 diabetes mellitus without complication ([place])   . TIA (transient ischemic attack) 03/15/2015  . Menorrhagia with regular cycle 01/13/2014  . Mild intermittent asthma  02/28/2012  . Anxiety disorder 02/07/2012  . Hypertension 01/07/2012  . Hypothyroidism following radioiodine therapy 08/28/2011  . Grave's disease 05/07/2011  . Family hx-breast malignancy 11/20/2010  . Chronic migraine without aura 05/04/2010  . TOBACCO USER 03/11/2009  . LOW BACK PAIN, CHRONIC 01/09/2009  . OBESITY, UNSPECIFIED 09/27/2008  . Mood disorder ([place]) 09/22/2008    Past Surgical History:  Procedure Laterality Date  . AXILLARY LYMPH NODE DISSECTION  2001  . HYDRADENITIS EXCISION    . TUBAL LIGATION  1995    OB History    Gravida Para Term Preterm AB Living   _0 SAB TAB Ectopic Multiple Live Births       0           Home Medications    Prior to Admission medications   Medication Sig Start Date End Date Taking? Authorizing Provider  ACCU-CHEK AVIVA PLUS test strip USE TO TEST BLOOD SUGAR TWICE DAILY ONCE IN THE MORNING BEFORE BREAKFAST AND ONCE AN HOUR AFTER LARGEST MEAL OF THE DAY 02/06/16   [place] N Rumley, DO  ACCU-CHEK SOFTCLIX LANCETS lancets USE TO TEST BLOOD SUGAR TWICE DAILY ONCE IN THE MORNING BEFORE BREAKFAST AND ONCE AN HOUR AFTER LARGEST MEAL OF THE DAY 02/27/16   [place] N Rumley, DO  albuterol (PROAIR HFA) 108 (90 Base) MCG/ACT inhaler Inhale 2 puffs into the lungs every 4 (four) hours as needed  for wheezing. 06/28/16   [place] N Rumley, DO  benzonatate (TESSALON) 100 MG capsule TAKE 1 CAPSULE BY MOUTH TWICE DAILY FOR COUGH 05/23/16   [place] N Rumley, DO  blood glucose meter kit and supplies KIT Dispense based on patient and insurance preference. Use twice daily, once in the morning before breakfast and once an hour after largest meal of the day. Please write down the date, time, and blood sugar level. Please bring blood sugar log to all office visits. (FOR ICD-9 250.00, 250.01). 02/02/16   Burna Cash Rumley, DO  canagliflozin (INVOKANA) 100 MG TABS tablet Take 1 tablet (100 mg total) by mouth daily before breakfast. 06/03/16   [place] N Rumley, DO    escitalopram (LEXAPRO) 20 MG tablet TAKE 1 TABLET(20 MG) BY MOUTH DAILY 10/16/15   [place] N Rumley, DO  esomeprazole (NEXIUM) 20 MG capsule Take 20 mg by mouth daily at 12 noon.    Historical Provider, MD  glipiZIDE (GLUCOTROL) 10 MG tablet TAKE 1 TABLET BY MOUTH TWICE DAILY BEFORE A MEAL 06/13/16   [place] N Rumley, DO  levothyroxine (SYNTHROID, LEVOTHROID) 125 MCG tablet Take 1 tablet (125 mcg total) by mouth daily. 07/09/16   [place] N Rumley, DO  nortriptyline (PAMELOR) 25 MG capsule TAKE 3 CAPSULES(75 MG) BY MOUTH AT BEDTIME 05/27/16   [place] N Rumley, DO  pregabalin (LYRICA) 75 MG capsule Take 1 capsule (75 mg total) by mouth 2 (two) times daily. 05/03/16   [place] N Rumley, DO  progesterone (PROMETRIUM) 200 MG capsule Take 1 capsule (200 mg total) by mouth at bedtime. 07/09/16   Terrance Mass, MD  promethazine (PHENERGAN) 25 MG tablet TAKE 1 TABLET(25 MG) BY MOUTH EVERY 8 HOURS AS NEEDED FOR NAUSEA OR VOMITING 05/08/16   [place] N Rumley, DO  rizatriptan (MAXALT-MLT) 10 MG disintegrating tablet Take 1 tablet (10 mg total) by mouth as needed for migraine. May repeat in 2 hours if needed 10/09/15   Olin Hauser, DO  topiramate (TOPAMAX) 25 MG tablet TAKE 1 TABLET BY MOUTH DAILY AT 10:00 PM 05/12/15   Lorna Few, DO    Family History Family History  Problem Relation Age of Onset  . Hypertension Mother   . Diabetes Mother   . Cancer Mother     rectal  . Heart disease Father 42    MI x5  . Hypertension Father   . Diabetes Father   . Cancer Sister 11    breast  . Drug abuse Brother   . Alcohol abuse Brother   . Drug abuse Brother   . Alcohol abuse Brother   . Anxiety disorder Neg Hx   . Bipolar disorder Neg Hx   . Depression Neg Hx     Social History Social History  Substance Use Topics  . Smoking status: Former Smoker    Packs/day: 0.50    Types: Cigarettes  . Smokeless tobacco: Never Used  . Alcohol use No     Comment: rare     Allergies    Ergotrate [ergonovine]; Ibuprofen; Metformin and related; and Yutopar [ritodrine]   Review of Systems Review of Systems  Constitutional: Negative for chills, diaphoresis and fever.  Respiratory: Negative for shortness of breath.   Cardiovascular: Negative for chest pain.  Gastrointestinal: Positive for nausea. Negative for abdominal pain, diarrhea and vomiting.  Endocrine: Positive for polydipsia and polyuria.       Hyperglycemia  Neurological: Negative for dizziness, weakness, light-headedness, numbness and headaches.  All other systems reviewed and  are negative.    Physical Exam Updated Vital Signs BP 155/100 (BP Location: Right Arm)   Pulse 99   Temp 98.3 F (36.8 C) (Oral)   Resp 16   LMP 06/21/2016   SpO2 100%   Physical Exam  Constitutional: She is oriented to person, place, and time. She appears well-developed and well-nourished. No distress.  HENT:  Head: Normocephalic and atraumatic.  Eyes: Conjunctivae and EOM are normal. Pupils are equal, round, and reactive to light.  Neck: Neck supple.  Cardiovascular: Normal rate, regular rhythm, normal heart sounds and intact distal pulses.   Pulmonary/Chest: Effort normal and breath sounds normal. No respiratory distress.  Abdominal: Soft. There is no tenderness. There is no guarding.  Musculoskeletal: She exhibits no edema.  Lymphadenopathy:    She has no cervical adenopathy.  Neurological: She is alert and oriented to person, place, and time.  Skin: Skin is warm and dry. She is not diaphoretic.  Psychiatric: She has a normal mood and affect. Her behavior is normal.  Nursing note and vitals reviewed.    ED Treatments / Results  Labs (all labs ordered are listed, but only abnormal results are displayed) Labs Reviewed  CBC WITH DIFFERENTIAL/PLATELET - Abnormal; Notable for the following:       Result Value   RBC 5.46 (*)    MCV 77.3 (*)    MCH 25.5 (*)    RDW 15.9 (*)    All other components within normal limits   COMPREHENSIVE METABOLIC PANEL - Abnormal; Notable for the following:    Sodium 134 (*)    Chloride 97 (*)    Glucose, Bld 317 (*)    Total Bilirubin 0.2 (*)    All other components within normal limits  URINALYSIS, ROUTINE W REFLEX MICROSCOPIC - Abnormal; Notable for the following:    Color, Urine STRAW (*)    APPearance HAZY (*)    Specific Gravity, Urine 1.033 (*)    Glucose, UA >=500 (*)    Bacteria, UA RARE (*)    Squamous Epithelial / LPF 6-30 (*)    All other components within normal limits  CBG MONITORING, ED - Abnormal; Notable for the following:    Glucose-Capillary 297 (*)    All other components within normal limits  CBG MONITORING, ED - Abnormal; Notable for the following:    Glucose-Capillary 230 (*)    All other components within normal limits    EKG  EKG Interpretation None       Radiology No results found.  Procedures Procedures (including critical care time)  Medications Ordered in ED Medications - No data to display   Initial Impression / Assessment and Plan / ED Course  I have reviewed the triage vital signs and the nursing notes.  Pertinent labs & imaging results that were available during my care of the patient were reviewed by me and considered in my medical decision making (see chart for details).     Patient presents with a complaint of hyperglycemia. Blood glucose trending downward without intervention. Nontoxic appearing. Hyperglycemia without anion gap noted on labs. Patient able to tolerate by mouth fluids. Patient appears to be safe for home management of her hyperglycemia. PCP follow-up. Patient states she will follow up in 24 hours. Return precautions discussed.  Vitals:   07/13/16 2311 07/14/16 0341  BP: 155/100 150/94  Pulse: 99 89  Resp: 16 16  Temp: 98.3 F (36.8 C)   TempSrc: Oral   SpO2: 100% 97%  Final Clinical Impressions(s) / ED Diagnoses   Final diagnoses:  Hyperglycemia    New Prescriptions Discharge  Medication List as of 07/14/2016  3:00 AM       Lorayne Bender, PA-C 07/14/16 [place], MD 07/14/16 [phone]

## 2016-07-15 ENCOUNTER — Encounter: Payer: Self-pay | Admitting: Endocrinology

## 2016-07-16 ENCOUNTER — Encounter: Payer: Self-pay | Admitting: Family Medicine

## 2016-07-20 ENCOUNTER — Other Ambulatory Visit (HOSPITAL_COMMUNITY): Payer: Self-pay | Admitting: Psychiatry

## 2016-07-20 DIAGNOSIS — F411 Generalized anxiety disorder: Secondary | ICD-10-CM

## 2016-07-20 DIAGNOSIS — F4310 Post-traumatic stress disorder, unspecified: Secondary | ICD-10-CM

## 2016-07-20 DIAGNOSIS — F333 Major depressive disorder, recurrent, severe with psychotic symptoms: Secondary | ICD-10-CM

## 2016-07-24 ENCOUNTER — Ambulatory Visit (INDEPENDENT_AMBULATORY_CARE_PROVIDER_SITE_OTHER): Payer: 59 | Admitting: Gynecology

## 2016-07-24 ENCOUNTER — Encounter: Payer: Self-pay | Admitting: Gynecology

## 2016-07-24 VITALS — BP 138/80 | Ht 63.0 in | Wt 207.0 lb

## 2016-07-24 DIAGNOSIS — N920 Excessive and frequent menstruation with regular cycle: Secondary | ICD-10-CM

## 2016-07-24 NOTE — Progress Notes (Signed)
   Patient is a 51 year old was seen in the office in January 5 as part of her evaluation for menorrhagia. She is here for preoperative examination and placement of laminaria intracervically before her planned cryoablation of her uterus. The office tomorrow. Her history as follows:  Patient had been seen by our nurse practitioner Ms. Elon Alas on December 1 and she has stated the patient had a benign endocervical polyp seen at the primary care office and an ultrasound on November. A possibility of endometrial polyps since her endometrium was 10 mm in thickness. She reports having normal menstrual cycles heavy lasting 7-8 days she's had a previous tubal ligation the past. So she's here for further evaluation. Patient did have a normal Pap smear in 2015.  Ultrasound sonohysterogram today: Uterus 9.1 x 6.1 x 4.9 cm with an endometrial stripe of 7 mm. 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 Right thick walled follicle 8 mm was noted left ovary was normal. No fluid in the cul-de-sac. The cervix is then cleansed with Betadine solution a sterile catheter was introduced into the uterine cavity and normal saline was instilled and no intracavitary defect was noted. Following this a sterile Pipelle was introduced into the uterine cavity whereby an endometrial biopsy was obtained and tissue submitted for histological evaluation.  Pathology report: Diagnosis Endometrium, biopsy, uterus - PROLIFERATIVE ENDOMETRIUM. - NO HYPERPLASIA OR MALIGNANCY.  Exam: Abdomen: Soft nontender no rebound or guarding Pelvic: Bartholin urethra Skene was within normal limits vagina some menstrual blood was present Cervix: No lesions or discharge Uterus: Anteverted normal size shape and consistency Adnexa: No palpable mass or tenderness Rectal exam: Not done  The cervix was cleansed with Betadine solution a signal 2 tenaculum was then placed on the anterior cervical lip and a laminaria was placed intracervically and the  single-tooth tenaculum was removed. A cost was placed in the vagina to maintain the laminaria in the vagina until its removal tomorrow.  Assessment/plan: Patient with menorrhagia scheduled for cryoablation of the uterine cavity tomorrow. The risks benefits and pros and cons of the surgery were discussed with the patient. Consent form was signed. The following prescriptions were provided: #1 Cytotec 200 g patient to take 1 tablet at 7 PM tonight #2 Percocet 5/325 to take 1 by mouth every 6-8 hours when necessary. She's to take one before bedtime tonight and take one in the morning of the procedure before coming to the office. #3 Zithromax 500 mg patient to take 1 tablet the morning of the procedure and repeat in 24 hours

## 2016-07-25 ENCOUNTER — Ambulatory Visit (INDEPENDENT_AMBULATORY_CARE_PROVIDER_SITE_OTHER): Payer: 59 | Admitting: Gynecology

## 2016-07-25 ENCOUNTER — Encounter: Payer: Self-pay | Admitting: Gynecology

## 2016-07-25 ENCOUNTER — Ambulatory Visit (INDEPENDENT_AMBULATORY_CARE_PROVIDER_SITE_OTHER): Payer: 59

## 2016-07-25 VITALS — BP 122/82 | HR 92 | Ht 63.0 in | Wt 207.0 lb

## 2016-07-25 DIAGNOSIS — R102 Pelvic and perineal pain: Secondary | ICD-10-CM | POA: Diagnosis not present

## 2016-07-25 DIAGNOSIS — N921 Excessive and frequent menstruation with irregular cycle: Secondary | ICD-10-CM

## 2016-07-25 DIAGNOSIS — N920 Excessive and frequent menstruation with regular cycle: Secondary | ICD-10-CM

## 2016-07-25 MED ORDER — KETOROLAC TROMETHAMINE 30 MG/ML IJ SOLN
60.0000 mg | Freq: Once | INTRAMUSCULAR | Status: AC
  Administered 2016-07-25: 60 mg via INTRAMUSCULAR

## 2016-07-25 MED ORDER — MEGESTROL ACETATE 40 MG PO TABS: 40.0000 mg | ORAL_TABLET | Freq: Two times a day (BID) | ORAL | 1 refills | Status: DC

## 2016-07-25 MED ORDER — LIDOCAINE HCL 1 % IJ SOLN
10.0000 mL | Freq: Once | INTRAMUSCULAR | Status: AC
  Administered 2016-07-25: 10 mL

## 2016-07-25 NOTE — Progress Notes (Signed)
Patient Name:Kendra Hall  Patient MRN: [id number]   Date:07/25/2016   Diagnosis:  Excessive Uterine Bleeding/Menorrhagia  Procedure:  Endometrial cryoablation with intraoperative ultrasonic guidance  Procedure Medications: Toradol 60 mg IM. Paracervical block with 1% lidocaine for a total of 10 cc was infiltrated at the 2, 4, 8 and 10:00 position of the ectocervix  the gauze and laminaria that had been placed the day before was removed before the start of the procedure  Procedure:  The Patient was brought to the treatment room having previously been counseled for the procedure position and a speculum was inserted.  The cervix and upper vagina were cleaned with Betadine.  A single tooth tenaculum was placed on the anterior lip of the cervix.  A paracervical block was placed per above.  The uterus was sounded to 8cm.  Cervical dilation was not performed.  Under ultrasound guidance, the Her Option probe was introduced into the uterine cavity after the pre procedural sequence was performed.  After assuring proper cornual placement, Cryoablation was then performed under continuous ultrasound guidance monitoring the growth of the cryozone.  Sequential cryoablation were performed in the following order, locations, freeze times and post freeze myometrial depths.           Location of Freeze Length of Time Myometrial Depth 1. Right side of uterus    6 minutes            9 mm 2. Less side of uterus    55mm                   9.9 mm  Upon completion of the procedure, the instruments were removed, hemostasis visualized and the patient was assisted to the bathroom and then another exam room where she was observed.  Vitals:   Pre treatment:  Tim9:05 AM  BP:132/80  P: 72 Post Treatment:  Time: 11 AM  BP: 122/82  P: 92  The patient tolerated the procedure well and was released in stable condition with her driver along with a copy of the post procedure instruments which were reviewed with her.  She is to  return to the office in 2 weeks for a post procedural check.  Patient will be prescribed Megace 40 mg to take 1 by mouth twice a day for 2 weeks since when we started the ablation she was on the fourth day of her menstrual cycle and could not reschedule.  [facility] HMD10:35 AMTD@

## 2016-08-02 ENCOUNTER — Ambulatory Visit (INDEPENDENT_AMBULATORY_CARE_PROVIDER_SITE_OTHER): Payer: 59 | Admitting: Endocrinology

## 2016-08-02 VITALS — BP 136/84 | HR 92 | Ht 63.0 in | Wt 202.0 lb

## 2016-08-02 DIAGNOSIS — E119 Type 2 diabetes mellitus without complications: Secondary | ICD-10-CM

## 2016-08-02 LAB — TSH: TSH: 6.2 u[IU]/mL — ABNORMAL HIGH (ref 0.35–4.50)

## 2016-08-02 LAB — POCT GLYCOSYLATED HEMOGLOBIN (HGB A1C): Hemoglobin A1C: 12.2

## 2016-08-02 NOTE — Patient Instructions (Addendum)
good diet and exercise significantly improve the control of your diabetes.  please let me know if you wish to be referred to a dietician.  high blood sugar is very risky to your health.  you should see an eye doctor and dentist every year.  It is very important to get all recommended vaccinations.  Controlling your blood pressure and cholesterol drastically reduces the damage diabetes does to your body.  Those who smoke should quit.  Please discuss these with your doctor.  check your blood sugar once a day.  vary the time of day when you check, between before the 3 meals, and at bedtime.  also check if you have symptoms of your blood sugar being too high or too low.  please keep a record of the readings and bring it to your next appointment here (or you can bring the meter itself).  You can write it on any piece of paper.  please call us sooner if your blood sugar goes below 70, or if you have a lot of readings over 200.   Please see Vaughan Basta, to learn about the insulin.  It is easy to take, and hurts less than checking the blood sugar. blood tests are requested for you today.  We'll let you know about the results.   Please come back for a follow-up appointment in 2 weeks.

## 2016-08-02 NOTE — Progress Notes (Signed)
Subjective:    Patient ID: Kendra Hall, female    DOB: Jun 03, 1966, 51 y.o.   MRN: [id number]  HPI pt is referred by Dr Gerlean Ren, for diabetes (she says this and also hypothyroidism).  Pt states DM was dx'ed in 2012 (she had GDM in 1984); she has mild if any neuropathy of the lower extremities; she is unaware of any associated chronic complications; she has never been on insulin; pt says her diet and exercise are improved recently; she has never had pancreatitis, severe hypoglycemia or DKA. She takes 2 oral meds.   She says cbg's vary from 100-500's.   Past Medical History:  Diagnosis Date  . Allergy   . Anxiety   . Asthma   . Depression   . Diabetes mellitus   . Fibromyalgia   . Gastric ulcer   . GERD (gastroesophageal reflux disease)   . Grave's disease   . Graves disease   . H/O therapeutic radiation   . Migraine   . Multiple thyroid nodules   . Myofacial muscle pain   . Osteoarthritis    Facet hypertrophy with injections  . TIA (transient ischemic attack)    02/2015    Past Surgical History:  Procedure Laterality Date  . AXILLARY LYMPH NODE DISSECTION  2001  . HYDRADENITIS EXCISION    . TUBAL LIGATION  1995    Social History   Social History  . Marital status: Single    Spouse name: N/A  . Number of children: 4  . Years of education: 14   Occupational History  . 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 Smoking status: Former Smoker    Packs/day: 0.50    Types: Cigarettes  . Smokeless tobacco: Never Used  . Alcohol use No     Comment: rare  . 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  Today denies all drug abuse  . Sexual activity: Not Currently    Birth control/ protection: Surgical   Other Topics Concern  . Not on file   Social History Narrative   Lives alone in [place]. Her daugther occassoinally comes and stay with her. Pt is working Programmer, applications at [employer]. Enjoys reading and pt has an associates in applied sciences.  originally from [place],  [place].  Raised by mom and her sister who is 8 yrs older than pt. Pt has several half siblings. No longer on disability. Pt has 4 kids, never married.              Current Outpatient Prescriptions on File Prior to Visit  Medication Sig Dispense Refill  . ACCU-CHEK AVIVA PLUS test strip USE TO TEST BLOOD SUGAR TWICE DAILY ONCE IN THE MORNING BEFORE BREAKFAST AND ONCE AN HOUR AFTER LARGEST MEAL OF THE DAY 300 each 2  . ACCU-CHEK SOFTCLIX LANCETS lancets USE TO TEST BLOOD SUGAR TWICE DAILY ONCE IN THE MORNING BEFORE BREAKFAST AND ONCE AN HOUR AFTER LARGEST MEAL OF THE DAY 100 each 0  . albuterol (PROAIR HFA) 108 (90 Base) MCG/ACT inhaler Inhale 2 puffs into the lungs every 4 (four) hours as needed for wheezing. 1 Inhaler 5  . canagliflozin (INVOKANA) 100 MG TABS tablet Take 1 tablet (100 mg total) by mouth daily before breakfast. 90 tablet 3  . escitalopram (LEXAPRO) 20 MG tablet TAKE 1 TABLET(20 MG) BY MOUTH  DAILY 90 tablet 3  . esomeprazole (NEXIUM) 20 MG capsule Take 20 mg by mouth daily at 12 noon.    Marland Kitchen glipiZIDE (GLUCOTROL) 10 MG tablet TAKE 1 TABLET BY MOUTH TWICE DAILY BEFORE A MEAL 60 tablet 3  . levothyroxine (SYNTHROID, LEVOTHROID) 125 MCG tablet Take 1 tablet (125 mcg total) by mouth daily. 60 tablet 0  . nortriptyline (PAMELOR) 25 MG capsule TAKE 3 CAPSULES(75 MG) BY MOUTH AT BEDTIME 270 capsule 3  . promethazine (PHENERGAN) 25 MG tablet TAKE 1 TABLET(25 MG) BY MOUTH EVERY 8 HOURS AS NEEDED FOR NAUSEA OR VOMITING 30 tablet 0  . rizatriptan (MAXALT-MLT) 10 MG disintegrating tablet Take 1 tablet (10 mg total) by mouth as needed for migraine. May repeat in 2 hours if needed 12 tablet 1  . topiramate (TOPAMAX) 25 MG tablet TAKE 1 TABLET BY MOUTH DAILY AT 10:00 PM 90 tablet 3  . megestrol (MEGACE) 40 MG tablet Take 1 tablet (40 mg total) by mouth 2 (two) times daily. (Patient  not taking: Reported on 08/02/2016) 28 tablet 1   No current facility-administered medications on file prior to visit.     Allergies  Allergen Reactions  . Ergotrate [Ergonovine]   . Ibuprofen Other (See Comments)    Patient has ulcer  . Metformin And Related Other (See Comments)    Dizziness  . Yutopar [Ritodrine]     Family History  Problem Relation Age of Onset  . Hypertension Mother   . Diabetes Mother   . Cancer Mother     rectal  . Heart disease Father 7    MI x5  . Hypertension Father   . Diabetes Father   . Cancer Sister 75    breast  . Drug abuse Brother   . Alcohol abuse Brother   . Drug abuse Brother   . Alcohol abuse Brother   . Anxiety disorder Neg Hx   . Bipolar disorder Neg Hx   . Depression Neg Hx     BP 136/84   Pulse 92   Ht 5\' 3"  (1.6 m)   Wt 202 lb (91.6 kg)   LMP 07/21/2016   SpO2 96%   BMI 35.78 kg/m    Review of Systems  denies weight loss, blurry vision, headache, chest pain, sob, n/v, urinary frequency, muscle cramps, excessive diaphoresis, cold intolerance, rhinorrhea, and easy bruising.  Depression is well-controlled.       Objective:   Physical Exam VS: see vs page GEN: no distress HEAD: head: no deformity eyes: no periorbital swelling; there is slight bilat proptosis.  external nose and ears are normal mouth: no lesion seen NECK: supple, thyroid is not enlarged CHEST WALL: no deformity LUNGS: clear to auscultation CV: reg rate and rhythm, no murmur ABD: abdomen is soft, nontender.  no hepatosplenomegaly.  not distended.  no hernia MUSCULOSKELETAL: muscle bulk and strength are grossly normal.  no obvious joint swelling.  gait is normal and steady EXTEMITIES: no deformity.  no ulcer on the feet.  feet are of normal color and temp.  no edema PULSES: dorsalis pedis intact bilat.  no carotid bruit NEURO:  cn 2-12 grossly intact.   readily moves all 4's.  sensation is intact to touch on the feet SKIN:  Normal texture and  temperature.  No rash or suspicious lesion is visible.   NODES:  None palpable at the neck PSYCH: alert, well-oriented.  Does not appear anxious nor depressed.   Lab Results  Component Value Date   HGBA1C  11.6 05/03/2016   I personally reviewed electrocardiogram tracing (02/11/16): Indication: DM Impression: NSR.  No MI.  No hypertrophy. Compared to 07/26/15: no change.  Lab Results  Component Value Date   TSH 6.20 (H) 08/02/2016   I have reviewed outside records, and summarized:  Pt was noted to have severely elevated a1c, and referred here.  She was noted to have ongoing fibromyalgia symptoms.  DM was poorly controlled, but pt said it was improved.    Assessment & Plan:  Type 2 DM: severe exacerbation. I advised insulin.  as she is hesitant to take insulin, she should start with QD.   Hypothyroidism, new to me: as TSH was varied, I advised same rx for now.  Patient is advised the following: Patient Instructions  good diet and exercise significantly improve the control of your diabetes.  please let me know if you wish to be referred to a dietician.  high blood sugar is very risky to your health.  you should see an eye doctor and dentist every year.  It is very important to get all recommended vaccinations.  Controlling your blood pressure and cholesterol drastically reduces the damage diabetes does to your body.  Those who smoke should quit.  Please discuss these with your doctor.  check your blood sugar once a day.  vary the time of day when you check, between before the 3 meals, and at bedtime.  also check if you have symptoms of your blood sugar being too high or too low.  please keep a record of the readings and bring it to your next appointment here (or you can bring the meter itself).  You can write it on any piece of paper.  please call us sooner if your blood sugar goes below 70, or if you have a lot of readings over 200.   Please see Vaughan Basta, to learn about the insulin.  It is easy  to take, and hurts less than checking the blood sugar. blood tests are requested for you today.  We'll let you know about the results.   Please come back for a follow-up appointment in 2 weeks.

## 2016-08-05 ENCOUNTER — Encounter: Payer: Self-pay | Admitting: Anesthesiology

## 2016-08-05 ENCOUNTER — Other Ambulatory Visit (HOSPITAL_COMMUNITY): Payer: Self-pay | Admitting: Psychiatry

## 2016-08-05 DIAGNOSIS — F411 Generalized anxiety disorder: Secondary | ICD-10-CM

## 2016-08-05 DIAGNOSIS — F333 Major depressive disorder, recurrent, severe with psychotic symptoms: Secondary | ICD-10-CM

## 2016-08-05 DIAGNOSIS — F4310 Post-traumatic stress disorder, unspecified: Secondary | ICD-10-CM

## 2016-08-09 ENCOUNTER — Encounter: Payer: Self-pay | Admitting: Gynecology

## 2016-08-09 ENCOUNTER — Ambulatory Visit (INDEPENDENT_AMBULATORY_CARE_PROVIDER_SITE_OTHER): Payer: 59 | Admitting: Gynecology

## 2016-08-09 VITALS — BP 140/80 | Ht 63.0 in | Wt 202.0 lb

## 2016-08-09 DIAGNOSIS — Z09 Encounter for follow-up examination after completed treatment for conditions other than malignant neoplasm: Secondary | ICD-10-CM

## 2016-08-09 NOTE — Progress Notes (Signed)
   Patient is a 51 year old that presented to the office for her two-week postop visit. Patient is status post endometrial cryoablation with ultrasonic guidance as a result of her menorrhagia. Patient is doing well no complaints today.  Exam: Abdomen: Soft nontender no rebound or guarding Pelvic: Bartholin urethra Skene was within normal limits Vagina: No lesions or discharge Cervix: No lesions or discharge Uterus: Anteverted normal size shape and consistency Adnexa no palpable masses or tenderness Rectal exam not done  Patient declined flu vaccine  Patient week status post endometrial ablation with  Her Option technique 2 weeks ago doing well. Patient otherwise scheduled to return to the office in November this year for annual exam her period.

## 2016-08-16 ENCOUNTER — Other Ambulatory Visit: Payer: Self-pay | Admitting: Family Medicine

## 2016-08-19 ENCOUNTER — Encounter: Payer: Self-pay | Admitting: Nutrition

## 2016-08-20 ENCOUNTER — Other Ambulatory Visit: Payer: Self-pay | Admitting: Endocrinology

## 2016-08-20 ENCOUNTER — Encounter: Payer: Self-pay | Admitting: Endocrinology

## 2016-08-20 ENCOUNTER — Encounter: Payer: 59 | Attending: Endocrinology | Admitting: Nutrition

## 2016-08-20 ENCOUNTER — Ambulatory Visit (INDEPENDENT_AMBULATORY_CARE_PROVIDER_SITE_OTHER): Payer: 59 | Admitting: Endocrinology

## 2016-08-20 VITALS — BP 134/84 | HR 101 | Ht 63.0 in | Wt 205.0 lb

## 2016-08-20 DIAGNOSIS — Z713 Dietary counseling and surveillance: Secondary | ICD-10-CM | POA: Diagnosis present

## 2016-08-20 DIAGNOSIS — E119 Type 2 diabetes mellitus without complications: Secondary | ICD-10-CM | POA: Insufficient documentation

## 2016-08-20 DIAGNOSIS — Z794 Long term (current) use of insulin: Secondary | ICD-10-CM | POA: Diagnosis not present

## 2016-08-20 MED ORDER — INSULIN LISPRO 100 UNIT/ML (KWIKPEN): 5.0000 [IU] | PEN_INJECTOR | Freq: Three times a day (TID) | SUBCUTANEOUS | 11 refills | Status: DC

## 2016-08-20 NOTE — Progress Notes (Signed)
Subjective:    Patient ID: Kendra Hall, female    DOB: 04-Feb-1966, 51 y.o.   MRN: [id number]  HPI Pt returns for f/u of diabetes mellitus: DM type: Insulin-requiring type 2 Dx'ed: 0000000 Complications: none Therapy: insulin since 2018 GDM: 1984 DKA: never Severe hypoglycemia: never Pancreatitis: never Other: she has never taken insulin.  Interval history: pt states she feels well in general.   Past Medical History:  Diagnosis Date  . Allergy   . Anxiety   . Asthma   . Depression   . Diabetes mellitus   . Fibromyalgia   . Gastric ulcer   . GERD (gastroesophageal reflux disease)   . Grave's disease   . Graves disease   . H/O therapeutic radiation   . Migraine   . Multiple thyroid nodules   . Myofacial muscle pain   . Osteoarthritis    Facet hypertrophy with injections  . TIA (transient ischemic attack)    02/2015    Past Surgical History:  Procedure Laterality Date  . AXILLARY LYMPH NODE DISSECTION  2001  . HYDRADENITIS EXCISION    . TUBAL LIGATION  1995    Social History   Social History  . Marital status: Single    Spouse name: N/A  . Number of children: 4  . 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 Smoking status: Former Smoker    Packs/day: 0.50    Types: Cigarettes  . Smokeless tobacco: Never Used  . Alcohol use No     Comment: rare  . Drug use: No     Comment: cocaine use daily for 3 yrs in her 22's. 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  Today denies all drug abuse  . Sexual activity: Not Currently    Birth control/ protection: Surgical   Other Topics Concern  . Not on file   Social History Narrative   Lives alone in [place]. Her daugther occassoinally comes and stay with her. 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  originally from [place],  [place].  Raised by mom and her  sister who is 8 yrs older than pt. Pt has several half siblings. No longer on disability. Pt has 4 kids, never married.              Current Outpatient Prescriptions on File Prior to Visit  Medication Sig Dispense Refill  . ACCU-CHEK AVIVA PLUS test strip USE TO TEST BLOOD SUGAR TWICE DAILY ONCE IN THE MORNING BEFORE BREAKFAST AND ONCE AN HOUR AFTER LARGEST MEAL OF THE DAY 300 each 2  . ACCU-CHEK SOFTCLIX LANCETS lancets USE TO TEST BLOOD SUGAR TWICE DAILY ONCE IN THE MORNING BEFORE BREAKFAST AND ONCE AN HOUR AFTER LARGEST MEAL OF THE DAY 100 each 2  . albuterol (PROAIR HFA) 108 (90 Base) MCG/ACT inhaler Inhale 2 puffs into the lungs every 4 (four) hours as needed for wheezing. 1 Inhaler 5  . canagliflozin (INVOKANA) 100 MG TABS tablet Take 1 tablet (100 mg total) by mouth daily before breakfast. 90 tablet 3  . escitalopram (LEXAPRO) 20 MG tablet TAKE 1 TABLET(20 MG) BY MOUTH DAILY 90 tablet 3  . esomeprazole (NEXIUM) 20 MG capsule Take 20 mg by mouth daily at 12 noon.    Marland Kitchen glipiZIDE (GLUCOTROL) 10 MG tablet TAKE 1 TABLET BY MOUTH TWICE DAILY BEFORE  A MEAL 60 tablet 3  . levothyroxine (SYNTHROID, LEVOTHROID) 125 MCG tablet Take 1 tablet (125 mcg total) by mouth daily. 60 tablet 0  . megestrol (MEGACE) 40 MG tablet Take 1 tablet (40 mg total) by mouth 2 (two) times daily. 28 tablet 1  . nortriptyline (PAMELOR) 25 MG capsule TAKE 3 CAPSULES(75 MG) BY MOUTH AT BEDTIME 270 capsule 3  . promethazine (PHENERGAN) 25 MG tablet TAKE 1 TABLET(25 MG) BY MOUTH EVERY 8 HOURS AS NEEDED FOR NAUSEA OR VOMITING 30 tablet 0  . rizatriptan (MAXALT-MLT) 10 MG disintegrating tablet Take 1 tablet (10 mg total) by mouth as needed for migraine. May repeat in 2 hours if needed 12 tablet 1  . topiramate (TOPAMAX) 25 MG tablet TAKE 1 TABLET BY MOUTH DAILY AT 10:00 PM 90 tablet 3   No current facility-administered medications on file prior to visit.     Allergies  Allergen Reactions  . Ergotrate [Ergonovine]   .  Ibuprofen Other (See Comments)    Patient has ulcer  . Metformin And Related Other (See Comments)    Dizziness  . Yutopar [Ritodrine]     Family History  Problem Relation Age of Onset  . Hypertension Mother   . Diabetes Mother   . Cancer Mother     rectal  . Heart disease Father 16    MI x5  . Hypertension Father   . Diabetes Father   . Cancer Sister 27    breast  . Drug abuse Brother   . Alcohol abuse Brother   . Drug abuse Brother   . Alcohol abuse Brother   . Anxiety disorder Neg Hx   . Bipolar disorder Neg Hx   . Depression Neg Hx     BP 134/84   Pulse (!) 101   Ht 5\' 3"  (1.6 m)   Wt 205 lb (93 kg)   LMP 07/21/2016   SpO2 94%   BMI 36.31 kg/m   Review of Systems She denies hypoglycemia    Objective:   Physical Exam VITAL SIGNS:  See vs page GENERAL: no distress Pulses: dorsalis pedis intact bilat.   MSK: no deformity of the feet CV: no leg edema Skin:  no ulcer on the feet.  normal color and temp on the feet. Neuro: sensation is intact to touch on the feet      Assessment & Plan:  Insulin-requiring type 2 DM: she needs insulin.  Patient is advised the following: Patient Instructions  check your blood sugar once a day.  vary the time of day when you check, between before the 3 meals, and at bedtime.  also check if you have symptoms of your blood sugar being too high or too low.  please keep a record of the readings and bring it to your next appointment here (or you can bring the meter itself).  You can write it on any piece of paper.  please call us sooner if your blood sugar goes below 70, or if you have a lot of readings over 200.   Please see Vaughan Basta, to learn about the insulin.  I have sent a prescription to your pharmacy, to start insulin. Please continue the same diabetes pills for now.   Please come back for a follow-up appointment in 2 weeks.    Bariatric Surgery You have so much to gain by losing weight.  You may have already tried every diet  and exercise plan imaginable.  And, you may have sought advice from your family  physician, too.   Sometimes, in spite of such diligent efforts, you may not be able to achieve long-term results by yourself.  In cases of severe obesity, bariatric or weight loss surgery is a proven method of achieving long-term weight control.  Our Services Our bariatric surgery programs offer our patients new hope and long-term weight-loss solution.  Since introducing our services in 2003, we have conducted more than 2,400 successful procedures.  Our program is designated as a Programmer, multimedia by the Metabolic and Bariatric Surgery Accreditation and Quality Improvement Program (MBSAQIP), a IT trainer that sets rigorous patient safety and outcome standards.  Our program is also designated as a Ecologist by [employer].   Our exceptional weight-loss surgery team specializes in diagnosis, treatment, follow-up care, and ongoing support for our patients with severe weight loss challenges.  We currently offer laparoscopic sleeve gastrectomy, gastric bypass, and adjustable gastric band (LAP-BAND).    Attend our [place] Choosing to undergo a bariatric procedure is a big decision, and one that should not be taken lightly.  You now have two options in how you learn about weight-loss surgery - in person or online.  Our objective is to ensure you have all of the information that you need to evaluate the advantages and obligations of this life changing procedure.  Please note that you are not alone in this process, and our experienced team is ready to assist and answer all of your questions.  There are several ways to register for a seminar (either on-line or in person): 1)  Call [phone] 2) Go on-line to [facility] and register for either type of seminar.  [email or web address]

## 2016-08-20 NOTE — Patient Instructions (Addendum)
check your blood sugar once a day.  vary the time of day when you check, between before the 3 meals, and at bedtime.  also check if you have symptoms of your blood sugar being too high or too low.  please keep a record of the readings and bring it to your next appointment here (or you can bring the meter itself).  You can write it on any piece of paper.  please call us sooner if your blood sugar goes below 70, or if you have a lot of readings over 200.   Please see Vaughan Basta, to learn about the insulin.  I have sent a prescription to your pharmacy, to start insulin. Please continue the same diabetes pills for now.   Please come back for a follow-up appointment in 2 weeks.    Bariatric Surgery You have so much to gain by losing weight.  You may have already tried every diet and exercise plan imaginable.  And, you may have sought advice from your family physician, too.   Sometimes, in spite of such diligent efforts, you may not be able to achieve long-term results by yourself.  In cases of severe obesity, bariatric or weight loss surgery is a proven method of achieving long-term weight control.  Our Services Our bariatric surgery programs offer our patients new hope and long-term weight-loss solution.  Since introducing our services in 2003, we have conducted more than 2,400 successful procedures.  Our program is designated as a Programmer, multimedia by the Metabolic and Bariatric Surgery Accreditation and Quality Improvement Program (MBSAQIP), a IT trainer that sets rigorous patient safety and outcome standards.  Our program is also designated as a Ecologist by [employer].   Our exceptional weight-loss surgery team specializes in diagnosis, treatment, follow-up care, and ongoing support for our patients with severe weight loss challenges.  We currently offer laparoscopic sleeve gastrectomy, gastric bypass, and adjustable gastric band (LAP-BAND).    Attend our  [place] Choosing to undergo a bariatric procedure is a big decision, and one that should not be taken lightly.  You now have two options in how you learn about weight-loss surgery - in person or online.  Our objective is to ensure you have all of the information that you need to evaluate the advantages and obligations of this life changing procedure.  Please note that you are not alone in this process, and our experienced team is ready to assist and answer all of your questions.  There are several ways to register for a seminar (either on-line or in person): 1)  Call [phone] 2) Go on-line to [facility] and register for either type of seminar.  [email or web address]

## 2016-08-20 NOTE — Patient Instructions (Signed)
Take 5u of Humalog 5-10 min. Before meals.  Test blood sugars before meals and at bedtime.  Call if questions.

## 2016-08-20 NOTE — Progress Notes (Signed)
Pt. Was trained on the Humalog pen.  She attached a needle and dialed in [place].  She reported good understanding of the need to take this 5-10 min. Before meals.   We discussed the timing, where to inject, the need to rotate sites and how this insulin works to lower blood sugar.  We also discussed low blood sugars--symptoms and treatments.  She reported good understanding of this. She has reduced the amount of sweet tea, and juices--diluting 4 ounces in water.   She was given a starter kit of Humalog insulin with directions for pen use, some Nano pen needles and diet information.   We discussed the need to have protein with every meal, to reduce the amount of fats in the diet and to limit the portion sizes of carbohydrates in each meal.  She reported good understanding of this and had no final questions.

## 2016-08-21 ENCOUNTER — Other Ambulatory Visit: Payer: Self-pay | Admitting: Family Medicine

## 2016-08-21 ENCOUNTER — Telehealth: Payer: Self-pay | Admitting: Endocrinology

## 2016-08-21 DIAGNOSIS — F4310 Post-traumatic stress disorder, unspecified: Secondary | ICD-10-CM

## 2016-08-21 DIAGNOSIS — F411 Generalized anxiety disorder: Secondary | ICD-10-CM

## 2016-08-21 DIAGNOSIS — F333 Major depressive disorder, recurrent, severe with psychotic symptoms: Secondary | ICD-10-CM

## 2016-08-21 NOTE — Telephone Encounter (Signed)
Patient is calling, has questions on how she need  take her insulin   insulin lispro (HUMALOG KWIKPEN) 100 UNIT/ML KiwkPen  Please advise

## 2016-08-21 NOTE — Telephone Encounter (Signed)
Pt needs a refill on Lexapro. Pt uses [facility] on [address]. ep

## 2016-08-22 LAB — TSH: TSH: 0.144 u[IU]/mL — ABNORMAL LOW (ref 0.450–4.500)

## 2016-08-23 MED ORDER — ESCITALOPRAM OXALATE 20 MG PO TABS: ORAL_TABLET | ORAL | 3 refills | Status: DC

## 2016-09-02 ENCOUNTER — Ambulatory Visit (INDEPENDENT_AMBULATORY_CARE_PROVIDER_SITE_OTHER): Payer: 59 | Admitting: Gynecology

## 2016-09-02 ENCOUNTER — Encounter: Payer: Self-pay | Admitting: Gynecology

## 2016-09-02 VITALS — BP 124/78

## 2016-09-02 DIAGNOSIS — B373 Candidiasis of vulva and vagina: Secondary | ICD-10-CM | POA: Diagnosis not present

## 2016-09-02 DIAGNOSIS — N898 Other specified noninflammatory disorders of vagina: Secondary | ICD-10-CM | POA: Diagnosis not present

## 2016-09-02 DIAGNOSIS — L292 Pruritus vulvae: Secondary | ICD-10-CM | POA: Diagnosis not present

## 2016-09-02 DIAGNOSIS — N949 Unspecified condition associated with female genital organs and menstrual cycle: Secondary | ICD-10-CM

## 2016-09-02 DIAGNOSIS — B3731 Acute candidiasis of vulva and vagina: Secondary | ICD-10-CM

## 2016-09-02 LAB — WET PREP FOR TRICH, YEAST, CLUE
Clue Cells Wet Prep HPF POC: NONE SEEN
Trich, Wet Prep: NONE SEEN

## 2016-09-02 MED ORDER — FLUCONAZOLE 150 MG PO TABS
ORAL_TABLET | ORAL | 5 refills | Status: DC
Start: 2016-09-02 — End: 2016-09-23

## 2016-09-02 MED ORDER — CLINDAMYCIN PHOSPHATE 2 % VA CREA: 1.0000 | TOPICAL_CREAM | Freq: Every day | VAGINAL | 0 refills | Status: DC

## 2016-09-02 NOTE — Progress Notes (Signed)
   Patient is a 51 year old diabetic who presented to the office today complaining of past few days with vulvar pruritus and slight discharge. Patient has not been sexually active over 5 years. Last January she underwent an endometrial ablation via her option technique. She had a normal sonohysterogram an ultrasound at that time with the exception small fibroid. Patient denies any GU or GI complaints although currently she is on Linzess for contraception prescribed by her gastroenterologist.  Exam: Gen. appearance well-developed well-nourished female in no acute distress Back: No CVA tenderness Abdomen: Soft nontender no rebound or guarding Pelvic: Bartholin urethra Skene glands within normal limits Vagina slight white discharge was noted no gross lesions Cervix: No gross lesions on inspection Bimanual exam uterus anteverted normal size shape and consistency no palpable masses or tenderness Rectal exam not done  Wet prep yeast, few white blood cells and moderate bacteria  Assessment/plan: Diabetic patient with yeast vaginitis will be treated with Diflucan 150 mg one by mouth today. In the event of a combination of infection with bacterial vaginosis she will be prescribed Cleocin vaginal cream to apply daily at bedtime for one week.

## 2016-09-02 NOTE — Patient Instructions (Signed)
Bacterial Vaginosis Bacterial vaginosis is a vaginal infection that occurs when the normal balance of bacteria in the vagina is disrupted. It results from an overgrowth of certain bacteria. This is the most common vaginal infection among women ages 32-44. Because bacterial vaginosis increases your risk for STIs (sexually transmitted infections), getting treated can help reduce your risk for chlamydia, gonorrhea, herpes, and HIV (human immunodeficiency virus). Treatment is also important for preventing complications in pregnant women, because this condition can cause an early (premature) delivery. What are the causes? This condition is caused by an increase in harmful bacteria that are normally present in small amounts in the vagina. However, the reason that the condition develops is not fully understood. What increases the risk? The following factors may make you more likely to develop this condition:  Having a new sexual partner or multiple sexual partners.  Having unprotected sex.  Douching.  Having an intrauterine device (IUD).  Smoking.  Drug and alcohol abuse.  Taking certain antibiotic medicines.  Being pregnant. You cannot get bacterial vaginosis from toilet seats, bedding, swimming pools, or contact with objects around you. What are the signs or symptoms? Symptoms of this condition include:  Grey or white vaginal discharge. The discharge can also be watery or foamy.  A fish-like odor with discharge, especially after sexual intercourse or during menstruation.  Itching in and around the vagina.  Burning or pain with urination. Some women with bacterial vaginosis have no signs or symptoms. How is this diagnosed? This condition is diagnosed based on:  Your medical history.  A physical exam of the vagina.  Testing a sample of vaginal fluid under a microscope to look for a large amount of bad bacteria or abnormal cells. Your health care provider may use a cotton swab or a  small wooden spatula to collect the sample. How is this treated? This condition is treated with antibiotics. These may be given as a pill, a vaginal cream, or a medicine that is put into the vagina (suppository). If the condition comes back after treatment, a second round of antibiotics may be needed. Follow these instructions at home: Medicines   Take over-the-counter and prescription medicines only as told by your health care provider.  Take or use your antibiotic as told by your health care provider. Do not stop taking or using the antibiotic even if you start to feel better. General instructions   If you have a female sexual partner, tell her that you have a vaginal infection. She should see her health care provider and be treated if she has symptoms. If you have a female sexual partner, he does not need treatment.  During treatment:  Avoid sexual activity until you finish treatment.  Do not douche.  Avoid alcohol as directed by your health care provider.  Avoid breastfeeding as directed by your health care provider.  Drink enough water and fluids to keep your urine clear or pale yellow.  Keep the area around your vagina and rectum clean.  Wash the area daily with warm water.  Wipe yourself from front to back after using the toilet.  Keep all follow-up visits as told by your health care provider. This is important. How is this prevented?  Do not douche.  Wash the outside of your vagina with warm water only.  Use protection when having sex. This includes latex condoms and dental dams.  Limit how many sexual partners you have. To help prevent bacterial vaginosis, it is best to have sex with just one  partner (monogamous).  Make sure you and your sexual partner are tested for STIs.  Wear cotton or cotton-lined underwear.  Avoid wearing tight pants and pantyhose, especially during summer.  Limit the amount of alcohol that you drink.  Do not use any products that contain  nicotine or tobacco, such as cigarettes and e-cigarettes. If you need help quitting, ask your health care provider.  Do not use illegal drugs. Where to find more information:  Centers for Disease Control and Prevention: AppraiserFraud.fi  American Sexual Health Association (ASHA): www.ashastd.org  U.S. Department of Health and Financial controller, Office on Women's Health: DustingSprays.pl or SecuritiesCard.it Contact a health care provider if:  Your symptoms do not improve, even after treatment.  You have more discharge or pain when urinating.  You have a fever.  You have pain in your abdomen.  You have pain during sex.  You have vaginal bleeding between periods. Summary  Bacterial vaginosis is a vaginal infection that occurs when the normal balance of bacteria in the vagina is disrupted.  Because bacterial vaginosis increases your risk for STIs (sexually transmitted infections), getting treated can help reduce your risk for chlamydia, gonorrhea, herpes, and HIV (human immunodeficiency virus). Treatment is also important for preventing complications in pregnant women, because the condition can cause an early (premature) delivery.  This condition is treated with antibiotic medicines. These may be given as a pill, a vaginal cream, or a medicine that is put into the vagina (suppository). This information is not intended to replace advice given to you by your health care provider. Make sure you discuss any questions you have with your health care provider. Document Released: 06/10/2005 Document Revised: 02/24/2016 Document Reviewed: 02/24/2016 Elsevier Interactive Patient Education  2017 [employer]. Vaginal Yeast infection, Adult Vaginal yeast infection is a condition that causes soreness, swelling, and redness (inflammation) of the vagina. It also causes vaginal discharge. This is a common condition. Some women get this infection  frequently. What are the causes? This condition is caused by a change in the normal balance of the yeast (candida) and bacteria that live in the vagina. This change causes an overgrowth of yeast, which causes the inflammation. What increases the risk? This condition is more likely to develop in:  Women who take antibiotic medicines.  Women who have diabetes.  Women who take birth control pills.  Women who are pregnant.  Women who douche often.  Women who have a weak defense (immune) system.  Women who have been taking steroid medicines for a long time.  Women who frequently wear tight clothing. What are the signs or symptoms? Symptoms of this condition include:  White, thick vaginal discharge.  Swelling, itching, redness, and irritation of the vagina. The lips of the vagina (vulva) may be affected as well.  Pain or a burning feeling while urinating.  Pain during sex. How is this diagnosed? This condition is diagnosed with a medical history and physical exam. This will include a pelvic exam. Your health care provider will examine a sample of your vaginal discharge under a microscope. Your health care provider may send this sample for testing to confirm the diagnosis. How is this treated? This condition is treated with medicine. Medicines may be over-the-counter or prescription. You may be told to use one or more of the following:  Medicine that is taken orally.  Medicine that is applied as a cream.  Medicine that is inserted directly into the vagina (suppository). Follow these instructions at home:  Take or apply over-the-counter  and prescription medicines only as told by your health care provider.  Do not have sex until your health care provider has approved. Tell your sex partner that you have a yeast infection. That person should go to his or her health care provider if he or she develops symptoms.  Do not wear tight clothes, such as pantyhose or tight pants.  Avoid  using tampons until your health care provider approves.  Eat more yogurt. This may help to keep your yeast infection from returning.  Try taking a sitz bath to help with discomfort. This is a warm water bath that is taken while you are sitting down. The water should only come up to your hips and should cover your buttocks. Do this 3-4 times per day or as told by your health care provider.  Do not douche.  Wear breathable, cotton underwear.  If you have diabetes, keep your blood sugar levels under control. Contact a health care provider if:  You have a fever.  Your symptoms go away and then return.  Your symptoms do not get better with treatment.  Your symptoms get worse.  You have new symptoms.  You develop blisters in or around your vagina.  You have blood coming from your vagina and it is not your menstrual period.  You develop pain in your abdomen. This information is not intended to replace advice given to you by your health care provider. Make sure you discuss any questions you have with your health care provider. Document Released: 03/20/2005 Document Revised: 11/22/2015 Document Reviewed: 12/12/2014 Elsevier Interactive Patient Education  2017 [employer].

## 2016-09-03 ENCOUNTER — Other Ambulatory Visit: Payer: Self-pay | Admitting: Family Medicine

## 2016-09-09 ENCOUNTER — Ambulatory Visit: Payer: Self-pay | Admitting: Endocrinology

## 2016-09-10 ENCOUNTER — Ambulatory Visit (INDEPENDENT_AMBULATORY_CARE_PROVIDER_SITE_OTHER): Payer: 59 | Admitting: Endocrinology

## 2016-09-10 ENCOUNTER — Encounter: Payer: Self-pay | Admitting: Endocrinology

## 2016-09-10 VITALS — BP 138/80 | HR 90 | Ht 63.0 in | Wt 203.0 lb

## 2016-09-10 DIAGNOSIS — Z794 Long term (current) use of insulin: Secondary | ICD-10-CM | POA: Diagnosis not present

## 2016-09-10 DIAGNOSIS — E119 Type 2 diabetes mellitus without complications: Secondary | ICD-10-CM

## 2016-09-10 MED ORDER — INSULIN LISPRO 100 UNIT/ML (KWIKPEN): 10.0000 [IU] | PEN_INJECTOR | Freq: Three times a day (TID) | SUBCUTANEOUS | 11 refills | Status: DC

## 2016-09-10 NOTE — Telephone Encounter (Signed)
See message and please advise, Thanks!  

## 2016-09-10 NOTE — Telephone Encounter (Signed)
What else can she drink besides water? Please advise

## 2016-09-10 NOTE — Progress Notes (Signed)
Subjective:    Patient ID: Kendra Hall, female    DOB: May 19, 1966, 51 y.o.   MRN: [id number]  HPI Pt returns for f/u of diabetes mellitus: DM type: Insulin-requiring type 2 Dx'ed: 9935 Complications: none Therapy: insulin since 2018 GDM: 1984 DKA: never.   Severe hypoglycemia: never.  Pancreatitis: never.  Other: she takes multiple daily injections.  Interval history: pt states she feels well in general.  no cbg record, but states cbg's vary from 102-200's. There is no trend throughout the day.  Past Medical History:  Diagnosis Date  . Allergy   . Anxiety   . Asthma   . Depression   . Diabetes mellitus   . Fibromyalgia   . Gastric ulcer   . GERD (gastroesophageal reflux disease)   . Grave's disease   . Graves disease   . H/O therapeutic radiation   . Migraine   . Multiple thyroid nodules   . Myofacial muscle pain   . Osteoarthritis    Facet hypertrophy with injections  . TIA (transient ischemic attack)    02/2015    Past Surgical History:  Procedure Laterality Date  . AXILLARY LYMPH NODE DISSECTION  2001  . HYDRADENITIS EXCISION    . TUBAL LIGATION  1995    Social History   Social History  . Marital status: Single    Spouse name: N/A  . Number of children: 4  . Years of education: 14   Occupational History  . customer service [place] History Main Topics  . Smoking status: Former Smoker    Packs/day: 0.50    Types: Cigarettes  . Smokeless tobacco: Never Used  . Alcohol use No     Comment: rare  . 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  Today denies all drug abuse  . Sexual activity: Not Currently    Birth control/ protection: Surgical   Other Topics Concern  . Not on file   Social History Narrative   Lives alone in [place]. Her daugther occassoinally comes and stay with her. Pt is working Therapist, art at [employer]. Enjoys reading  and pt has an associates in applied sciences.  originally from [place],  [place].  Raised by mom and her sister who is 8 yrs older than pt. Pt has several half siblings. No longer on disability. Pt has 4 kids, never married.              Current Outpatient Prescriptions on File Prior to Visit  Medication Sig Dispense Refill  . ACCU-CHEK AVIVA PLUS test strip USE TO TEST BLOOD SUGAR TWICE DAILY ONCE IN THE MORNING BEFORE BREAKFAST AND ONCE AN HOUR AFTER LARGEST MEAL OF THE DAY 300 each 2  . ACCU-CHEK SOFTCLIX LANCETS lancets USE TO TEST BLOOD SUGAR TWICE DAILY ONCE IN THE MORNING BEFORE BREAKFAST AND ONCE AN HOUR AFTER LARGEST MEAL OF THE DAY 100 each 2  . albuterol (PROAIR HFA) 108 (90 Base) MCG/ACT inhaler Inhale 2 puffs into the lungs every 4 (four) hours as needed for wheezing. 1 Inhaler 5  . canagliflozin (INVOKANA) 100 MG TABS tablet Take 1 tablet (100 mg total) by mouth daily before breakfast. 90 tablet 3  . clindamycin (CLEOCIN) 2 % vaginal cream Place 1 Applicatorful vaginally at bedtime. 40 g 0  . escitalopram (LEXAPRO) 20 MG tablet TAKE 1 TABLET(20 MG) BY MOUTH  DAILY 90 tablet 3  . esomeprazole (NEXIUM) 20 MG capsule Take 20 mg by mouth daily at 12 noon.    . fluconazole (DIFLUCAN) 150 MG tablet Take 1 today and no improvement repeat in 72 hours 2 tablet 5  . levothyroxine (SYNTHROID, LEVOTHROID) 125 MCG tablet TAKE 1 TABLET BY MOUTH DAILY 60 tablet 2  . Linaclotide (LINZESS PO) Take 145 mg by mouth daily.     . nortriptyline (PAMELOR) 25 MG capsule TAKE 3 CAPSULES(75 MG) BY MOUTH AT BEDTIME 270 capsule 3  . promethazine (PHENERGAN) 25 MG tablet TAKE 1 TABLET(25 MG) BY MOUTH EVERY 8 HOURS AS NEEDED FOR NAUSEA OR VOMITING 30 tablet 0  . rizatriptan (MAXALT-MLT) 10 MG disintegrating tablet Take 1 tablet (10 mg total) by mouth as needed for migraine. May repeat in 2 hours if needed 12 tablet 1  . topiramate (TOPAMAX) 25 MG tablet TAKE 1 TABLET BY MOUTH DAILY AT 10:00 PM 90 tablet 3  .  megestrol (MEGACE) 40 MG tablet Take 1 tablet (40 mg total) by mouth 2 (two) times daily. (Patient not taking: Reported on 09/10/2016) 28 tablet 1   No current facility-administered medications on file prior to visit.     Allergies  Allergen Reactions  . Ergotrate [Ergonovine]   . Ibuprofen Other (See Comments)    Patient has ulcer  . Metformin And Related Other (See Comments)    Dizziness  . Yutopar [Ritodrine]     Family History  Problem Relation Age of Onset  . Hypertension Mother   . Diabetes Mother   . Cancer Mother     rectal  . Heart disease Father 69    MI x5  . Hypertension Father   . Diabetes Father   . Cancer Sister 8    breast  . Drug abuse Brother   . Alcohol abuse Brother   . Drug abuse Brother   . Alcohol abuse Brother   . Anxiety disorder Neg Hx   . Bipolar disorder Neg Hx   . Depression Neg Hx     BP 138/80   Pulse 90   Ht 5\' 3"  (1.6 m)   Wt 203 lb (92.1 kg)   LMP  (LMP Unknown)   SpO2 98%   BMI 35.96 kg/m    Review of Systems She denies hypoglycemia.     Objective:   Physical Exam VITAL SIGNS:  See vs page GENERAL: no distress Pulses: dorsalis pedis intact bilat.   MSK: no deformity of the feet CV: no leg edema Skin:  no ulcer on the feet.  normal color and temp on the feet. Neuro: sensation is intact to touch on the feet.    Lab Results  Component Value Date   CREATININE 0.91 07/13/2016   BUN 10 07/13/2016   NA 134 (L) 07/13/2016   K 3.9 07/13/2016   CL 97 (L) 07/13/2016   CO2 25 07/13/2016      Assessment & Plan:  Insulin-requiring type 2 DM: she needs increased rx.  She is ready to transition off oral medication.  Patient is advised the following: Patient Instructions  check your blood sugar once a day.  vary the time of day when you check, between before the 3 meals, and at bedtime.  also check if you have symptoms of your blood sugar being too high or too low.  please keep a record of the readings and bring it to your  next appointment here (or you can bring the meter itself).  You  can write it on any piece of paper.  please call us sooner if your blood sugar goes below 70, or if you have a lot of readings over 200.   Please stop taking the glipizide, and: Increase the insulin to 10 units 3 times a day (just before each meal), and: Please continue the same invokana.   Please come back for a follow-up appointment in 1 month.

## 2016-09-10 NOTE — Telephone Encounter (Signed)
Patient advised of message and agreed to referral.

## 2016-09-10 NOTE — Telephone Encounter (Signed)
done

## 2016-09-10 NOTE — Telephone Encounter (Signed)
No limitation, within a reasonable diet.  I would be happy to refer you to a dietician, who is here in the office.

## 2016-09-10 NOTE — Patient Instructions (Addendum)
check your blood sugar once a day.  vary the time of day when you check, between before the 3 meals, and at bedtime.  also check if you have symptoms of your blood sugar being too high or too low.  please keep a record of the readings and bring it to your next appointment here (or you can bring the meter itself).  You can write it on any piece of paper.  please call us sooner if your blood sugar goes below 70, or if you have a lot of readings over 200.   Please stop taking the glipizide, and: Increase the insulin to 10 units 3 times a day (just before each meal), and: Please continue the same invokana.   Please come back for a follow-up appointment in 1 month.

## 2016-09-23 ENCOUNTER — Emergency Department (HOSPITAL_COMMUNITY): Payer: 59

## 2016-09-23 ENCOUNTER — Other Ambulatory Visit: Payer: Self-pay | Admitting: Family Medicine

## 2016-09-23 ENCOUNTER — Emergency Department (HOSPITAL_COMMUNITY)
Admission: EM | Admit: 2016-09-23 | Discharge: 2016-09-24 | Disposition: A | Discharge status: Home / Self Care | Payer: 59 | Attending: Emergency Medicine | Admitting: Emergency Medicine

## 2016-09-23 ENCOUNTER — Ambulatory Visit: Payer: Self-pay | Admitting: Student

## 2016-09-23 ENCOUNTER — Encounter (HOSPITAL_COMMUNITY): Payer: Self-pay | Admitting: Emergency Medicine

## 2016-09-23 DIAGNOSIS — E119 Type 2 diabetes mellitus without complications: Secondary | ICD-10-CM | POA: Diagnosis not present

## 2016-09-23 DIAGNOSIS — Z8673 Personal history of transient ischemic attack (TIA), and cerebral infarction without residual deficits: Secondary | ICD-10-CM | POA: Diagnosis not present

## 2016-09-23 DIAGNOSIS — E039 Hypothyroidism, unspecified: Secondary | ICD-10-CM | POA: Insufficient documentation

## 2016-09-23 DIAGNOSIS — Z79899 Other long term (current) drug therapy: Secondary | ICD-10-CM | POA: Insufficient documentation

## 2016-09-23 DIAGNOSIS — R519 Headache, unspecified: Secondary | ICD-10-CM

## 2016-09-23 DIAGNOSIS — R079 Chest pain, unspecified: Secondary | ICD-10-CM

## 2016-09-23 DIAGNOSIS — Z794 Long term (current) use of insulin: Secondary | ICD-10-CM | POA: Diagnosis not present

## 2016-09-23 DIAGNOSIS — J45909 Unspecified asthma, uncomplicated: Secondary | ICD-10-CM | POA: Insufficient documentation

## 2016-09-23 DIAGNOSIS — R072 Precordial pain: Secondary | ICD-10-CM | POA: Insufficient documentation

## 2016-09-23 DIAGNOSIS — R51 Headache: Secondary | ICD-10-CM | POA: Insufficient documentation

## 2016-09-23 DIAGNOSIS — Z87891 Personal history of nicotine dependence: Secondary | ICD-10-CM | POA: Insufficient documentation

## 2016-09-23 LAB — BASIC METABOLIC PANEL
Anion gap: 10 (ref 5–15)
BUN: 14 mg/dL (ref 6–20)
CO2: 27 mmol/L (ref 22–32)
Calcium: 9.1 mg/dL (ref 8.9–10.3)
Chloride: 103 mmol/L (ref 101–111)
Creatinine, Ser: 0.91 mg/dL (ref 0.44–1.00)
GFR calc Af Amer: >60 mL/min (ref >60)
GFR calc non Af Amer: >60 mL/min (ref >60)
Glucose, Bld: 95 mg/dL (ref 65–99)
Potassium: 3.8 mmol/L (ref 3.5–5.1)
Sodium: 140 mmol/L (ref 135–145)

## 2016-09-23 LAB — CBC
HCT: 42.2 % (ref 36.0–46.0)
Hemoglobin: 13.6 g/dL (ref 12.0–15.0)
MCH: 25.6 pg — ABNORMAL LOW (ref 26.0–34.0)
MCHC: 32.2 g/dL (ref 30.0–36.0)
MCV: 79.3 fL (ref 78.0–100.0)
Platelets: 304 10*3/uL (ref 150–400)
RBC: 5.32 MIL/uL — ABNORMAL HIGH (ref 3.87–5.11)
RDW: 15.8 % — ABNORMAL HIGH (ref 11.5–15.5)
WBC: 7 10*3/uL (ref 4.0–10.5)

## 2016-09-23 LAB — I-STAT TROPONIN, ED: Troponin i, poc: 0 ng/mL (ref 0.00–0.08)

## 2016-09-23 MED ORDER — IOPAMIDOL (ISOVUE-370) INJECTION 76%
INTRAVENOUS | Status: AC
  Administered 2016-09-23: 100 mL
  Filled 2016-09-23: qty 100

## 2016-09-23 MED ORDER — DIPHENHYDRAMINE HCL 50 MG/ML IJ SOLN
25.0000 mg | Freq: Once | INTRAMUSCULAR | Status: AC
  Administered 2016-09-23: 25 mg via INTRAVENOUS
  Filled 2016-09-23: qty 1

## 2016-09-23 MED ORDER — METHYLPREDNISOLONE SODIUM SUCC 125 MG IJ SOLR
125.0000 mg | Freq: Once | INTRAMUSCULAR | Status: AC
  Administered 2016-09-23: 125 mg via INTRAVENOUS
  Filled 2016-09-23: qty 2

## 2016-09-23 MED ORDER — METOCLOPRAMIDE HCL 5 MG/ML IJ SOLN
10.0000 mg | Freq: Once | INTRAMUSCULAR | Status: AC
  Administered 2016-09-23: 10 mg via INTRAVENOUS
  Filled 2016-09-23: qty 2

## 2016-09-23 MED ORDER — SODIUM CHLORIDE 0.9 % IV BOLUS (SEPSIS)
1000.0000 mL | Freq: Once | INTRAVENOUS | Status: AC
  Administered 2016-09-23: 1000 mL via INTRAVENOUS

## 2016-09-23 MED ORDER — FENTANYL CITRATE (PF) 100 MCG/2ML IJ SOLN
50.0000 ug | Freq: Once | INTRAMUSCULAR | Status: AC
  Administered 2016-09-23: 50 ug via INTRAVENOUS
  Filled 2016-09-23: qty 2

## 2016-09-23 NOTE — ED Provider Notes (Signed)
[facility] DEPT Provider Note   CSN: [id number] Arrival date & time: 09/23/16  2119     History   Chief Complaint Chief Complaint  Patient presents with  . Chest Pain  . Headache    HPI Kendra Hall is a 51 y.o. female.   Chest Pain   This is a new problem. The current episode started 3 to 5 hours ago. The problem occurs constantly. The problem has been gradually improving. The pain is associated with coughing and breathing. The pain is present in the substernal region. The pain is moderate. The quality of the pain is described as pleuritic and sharp. The pain does not radiate. Pertinent negatives include no abdominal pain and no nausea. She has tried nothing for the symptoms. The treatment provided no relief.    Past Medical History:  Diagnosis Date  . Allergy   . Anxiety   . Asthma   . Depression   . Diabetes mellitus   . Fibromyalgia   . Gastric ulcer   . GERD (gastroesophageal reflux disease)   . Grave's disease   . Graves disease   . H/O therapeutic radiation   . Migraine   . Multiple thyroid nodules   . Myofacial muscle pain   . Osteoarthritis    Facet hypertrophy with injections  . TIA (transient ischemic attack)    02/2015    Patient Active Problem List   Diagnosis Date Noted  . Health care maintenance 02/04/2016  . Severe episode of recurrent major depressive disorder, with psychotic features ([place]) 08/22/2015  . GAD (generalized anxiety disorder) 08/22/2015  . PTSD (post-traumatic stress disorder) 08/22/2015  . Insomnia 08/22/2015  . GERD (gastroesophageal reflux disease) 04/18/2015  . Fibromyalgia 03/29/2015  . Type 2 diabetes mellitus without complication ([place])   . TIA (transient ischemic attack) 03/15/2015  . Mild intermittent asthma 02/28/2012  . Anxiety disorder 02/07/2012  . Hypertension 01/07/2012  . Hypothyroidism following radioiodine therapy 08/28/2011  . Grave's disease 05/07/2011  . Family hx-breast malignancy 11/20/2010  .  Chronic migraine without aura 05/04/2010  . TOBACCO USER 03/11/2009  . LOW BACK PAIN, CHRONIC 01/09/2009  . OBESITY, UNSPECIFIED 09/27/2008  . Mood disorder ([place]) 09/22/2008    Past Surgical History:  Procedure Laterality Date  . AXILLARY LYMPH NODE DISSECTION  2001  . HYDRADENITIS EXCISION    . TUBAL LIGATION  1995    OB History    Gravida Para Term Preterm AB Living   4       2 4    SAB TAB Ectopic Multiple Live Births       0           Home Medications    Prior to Admission medications   Medication Sig Start Date End Date Taking? Authorizing Provider  ACCU-CHEK AVIVA PLUS test strip USE TO TEST BLOOD SUGAR TWICE DAILY ONCE IN THE MORNING BEFORE BREAKFAST AND ONCE AN HOUR AFTER LARGEST MEAL OF THE DAY 02/06/16  Yes [place] N Rumley, DO  ACCU-CHEK SOFTCLIX LANCETS lancets USE TO TEST BLOOD SUGAR TWICE DAILY ONCE IN THE MORNING BEFORE BREAKFAST AND ONCE AN HOUR AFTER LARGEST MEAL OF THE DAY 08/16/16  Yes [place] N Rumley, DO  albuterol (PROAIR HFA) 108 (90 Base) MCG/ACT inhaler Inhale 2 puffs into the lungs every 4 (four) hours as needed for wheezing. 06/28/16  Yes [place] N Rumley, DO  canagliflozin (INVOKANA) 100 MG TABS tablet Take 1 tablet (100 mg total) by mouth daily before breakfast. 06/03/16  Yes Burna Cash  Rumley, DO  escitalopram (LEXAPRO) 20 MG tablet TAKE 1 TABLET(20 MG) BY MOUTH DAILY 08/23/16  Yes [place] N Rumley, DO  insulin lispro (HUMALOG KWIKPEN) 100 UNIT/ML KiwkPen Inject 0.1 mLs (10 Units total) into the skin 3 (three) times daily with meals. And pen needles 3/day 09/10/16  Yes Renato Shin, MD  levothyroxine (SYNTHROID, LEVOTHROID) 125 MCG tablet TAKE 1 TABLET BY MOUTH DAILY 09/03/16  Yes [place] N Rumley, DO  linaclotide (LINZESS) 145 MCG CAPS capsule Take 145 mcg by mouth daily before breakfast.   Yes Historical Provider, MD  nortriptyline (PAMELOR) 25 MG capsule TAKE 3 CAPSULES(75 MG) BY MOUTH AT BEDTIME Patient taking differently: TAKE 3 CAPSULES(75 MG) BY MOUTH AT  BEDTIME AS NEEDED FOR SLEEP 05/27/16  Yes [place] N Rumley, DO  Polyethyl Glycol-Propyl Glycol (SYSTANE OP) Apply 1-2 drops to eye daily as needed (dryness).   Yes Historical Provider, MD  promethazine (PHENERGAN) 25 MG tablet TAKE 1 TABLET(25 MG) BY MOUTH EVERY 8 HOURS AS NEEDED FOR NAUSEA OR VOMITING 05/08/16  Yes  N Rumley, DO  rizatriptan (MAXALT-MLT) 10 MG disintegrating tablet Take 1 tablet (10 mg total) by mouth as needed for migraine. May repeat in 2 hours if needed 10/09/15  Yes Olin Hauser, DO    Family History Family History  Problem Relation Age of Onset  . Hypertension Mother   . Diabetes Mother   . Cancer Mother     rectal  . Heart disease Father 94    MI x5  . Hypertension Father   . Diabetes Father   . Cancer Sister 79    breast  . Drug abuse Brother   . Alcohol abuse Brother   . Drug abuse Brother   . Alcohol abuse Brother   . Anxiety disorder Neg Hx   . Bipolar disorder Neg Hx   . Depression Neg Hx     Social History Social History  Substance Use Topics  . Smoking status: Former Smoker    Packs/day: 0.50    Types: Cigarettes  . Smokeless tobacco: Never Used  . Alcohol use No     Comment: rare     Allergies   Ergotrate [ergonovine]; Ibuprofen; Metformin and related; and Yutopar [ritodrine]   Review of Systems Review of Systems  Cardiovascular: Positive for chest pain.  Gastrointestinal: Negative for abdominal pain and nausea.  All other systems reviewed and are negative.    Physical Exam Updated Vital Signs BP (!) 155/83 (BP Location: Right Arm)   Pulse 75   Temp 98.2 F (36.8 C) (Oral)   Resp 16   LMP  (LMP Unknown)   SpO2 97%   Physical Exam  Constitutional: She is oriented to person, place, and time. She appears well-developed and well-nourished.  HENT:  Head: Normocephalic and atraumatic.  Eyes: Conjunctivae and EOM are normal.  Neck: Normal range of motion.  Cardiovascular: Normal rate and regular rhythm.     Pulmonary/Chest: Effort normal. No stridor. No respiratory distress. She has no wheezes.  Abdominal: Soft. She exhibits no distension.  Musculoskeletal: Normal range of motion. She exhibits no edema or deformity.  Neurological: She is alert and oriented to person, place, and time.  No altered mental status, able to give full seemingly accurate history.  Face is symmetric, EOM's intact, pupils equal and reactive, vision intact, tongue and uvula midline without deviation Upper and Lower extremity motor 5/5, intact pain perception in distal extremities, 2+ reflexes in biceps, patella and achilles tendons. Finger to nose normal, heel  to shin normal. Walks without assistance or evident ataxia.   Skin: Skin is warm and dry.  Nursing note and vitals reviewed.    ED Treatments / Results  Labs (all labs ordered are listed, but only abnormal results are displayed) Labs Reviewed  CBC - Abnormal; Notable for the following:       Result Value   RBC 5.32 (*)    MCH 25.6 (*)    RDW 15.8 (*)    All other components within normal limits  BASIC METABOLIC PANEL  I-STAT TROPOININ, ED    EKG  EKG Interpretation  Date/Time:  Monday September 23 2016 21:26:27 EDT Ventricular Rate:  76 PR Interval:    QRS Duration: 76 QT Interval:  429 QTC Calculation: 483 R Axis:   87 Text Interpretation:  Sinus rhythm Low voltage, precordial leads Borderline repolarization abnormality inverted t waves in III not new Confirmed by [facility] MD, Corene Cornea [phone]) on 09/23/2016 9:31:04 PM       Radiology Dg Chest 2 View  Result Date: 09/23/2016 CLINICAL DATA:  51 y/o  F; chest pain and shortness of breath. EXAM: CHEST  2 VIEW COMPARISON:  07/25/2014 chest radiograph. FINDINGS: Stable heart size and mediastinal contours are within normal limits. Both lungs are clear. Mild degenerative changes of the thoracic spine. IMPRESSION: No active cardiopulmonary disease. Electronically Signed   By: Kristine Garbe M.D.   On:  09/23/2016 22:14    Procedures Procedures (including critical care time)  Medications Ordered in ED Medications  metoCLOPramide (REGLAN) injection 10 mg (10 mg Intravenous Given 09/23/16 2221)  diphenhydrAMINE (BENADRYL) injection 25 mg (25 mg Intravenous Given 09/23/16 2225)  sodium chloride 0.9 % bolus 1,000 mL (1,000 mLs Intravenous New Bag/Given 09/23/16 2216)  methylPREDNISolone sodium succinate (SOLU-MEDROL) 125 mg/2 mL injection 125 mg (125 mg Intravenous Given 09/23/16 2217)  fentaNYL (SUBLIMAZE) injection 50 mcg (50 mcg Intravenous Given 09/23/16 2227)     Initial Impression / Assessment and Plan / ED Course  I have reviewed the triage vital signs and the nursing notes.  Pertinent labs & imaging results that were available during my care of the patient were reviewed by me and considered in my medical decision making (see chart for details).     Main coimplaint today is pleuritic cp but also aassociated with headache. Dissection study negative for PE/dissection/inflammatioin/heart disease.  Headache improved with HA cocktail but stil there. Will give a second round of medications but overall patient appears well for discharge after those.   Final Clinical Impressions(s) / ED Diagnoses   Final diagnoses:  None    New Prescriptions New Prescriptions   No medications on file     Merrily Pew, MD 09/25/16 1511

## 2016-09-23 NOTE — Telephone Encounter (Signed)
Pt is calling because she needs a new prescription written for her test strips so that her insurance will cover them. She is testing herself 4 times a day which is more than the original prescription. If you have any questions please call her. jw

## 2016-09-23 NOTE — ED Triage Notes (Signed)
Pt arrives via EMS from urgent care for chest pain, shortness of breath, bilateral arm pain, eye pain, headache that she describes as a migraine. States she called here and asked about wait time, didn't want to wait so she went to urgent care who sent her here for abnormal EKG and multiple complaints. They gave 324 MG aspirin. Reports pain 9/10. Hx TIA in September.

## 2016-09-23 NOTE — ED Notes (Signed)
Patient transported to X-ray 

## 2016-09-23 NOTE — ED Notes (Signed)
Patient transported to CT 

## 2016-09-24 ENCOUNTER — Ambulatory Visit: Payer: Self-pay | Admitting: Family Medicine

## 2016-09-24 MED ORDER — HALOPERIDOL LACTATE 5 MG/ML IJ SOLN
2.0000 mg | Freq: Once | INTRAMUSCULAR | Status: AC
  Administered 2016-09-24: 2 mg via INTRAVENOUS
  Filled 2016-09-24: qty 1

## 2016-09-24 MED ORDER — MAGNESIUM SULFATE 2 GM/50ML IV SOLN
2.0000 g | Freq: Once | INTRAVENOUS | Status: AC
  Administered 2016-09-24: 2 g via INTRAVENOUS
  Filled 2016-09-24: qty 50

## 2016-09-24 NOTE — ED Provider Notes (Signed)
1:07 AM Patient handoff from Dr. Dayna Barker at shift change.   Pt with CP and HA. CT of chest was performed and neg. Labs and troponin neg. CP improved with treatment.   Pt currently awaiting admin of Haldol and Magnesium for residual HA.   Patient seen by myself. She states that she is ready to go home. She has an additional 20 minutes left on magnesium administration. We agreed to discharge her to home as soon as that is completed.  BP (!) 150/88   Pulse 76   Temp 98.2 F (36.8 C) (Oral)   Resp 20   LMP  (LMP Unknown)   SpO2 97%   Encouraged PCP follow-up for recheck in 3 days.   Carlisle Cater, PA-C 09/24/16 4734    Merrily Pew, MD 09/25/16 [phone]

## 2016-09-24 NOTE — Telephone Encounter (Signed)
Pt informed of below. Zimmerman Rumple, April D, CMA  

## 2016-09-24 NOTE — Telephone Encounter (Signed)
She follows with Endocrinology for her diabetes management. Please have her contact their office.

## 2016-09-25 ENCOUNTER — Telehealth: Payer: Self-pay | Admitting: Endocrinology

## 2016-09-25 MED ORDER — GLUCOSE BLOOD VI STRP: 1.0000 | ORAL_STRIP | Freq: Four times a day (QID) | 3 refills | Status: DC

## 2016-09-25 NOTE — Telephone Encounter (Signed)
Please advise if ok to change rx. Patient's last office note stated to check blood sugar 1 time per day. Thanks!

## 2016-09-25 NOTE — Telephone Encounter (Signed)
I have sent a prescription to your pharmacy  

## 2016-09-25 NOTE — Telephone Encounter (Signed)
Patient stated she test 4 x a day and need more test strips to last her. ACCU-CHEK AVIVA PLUS test strip  Send to   [facility] [place], [place] AT [address] [phone] (Phone) [phone] (Fax)

## 2016-10-04 ENCOUNTER — Encounter: Payer: Self-pay | Admitting: Family Medicine

## 2016-10-04 ENCOUNTER — Ambulatory Visit (INDEPENDENT_AMBULATORY_CARE_PROVIDER_SITE_OTHER): Payer: 59 | Admitting: Family Medicine

## 2016-10-04 VITALS — BP 144/96 | HR 86 | Temp 98.0°F | Ht 63.0 in | Wt 203.4 lb

## 2016-10-04 DIAGNOSIS — H8112 Benign paroxysmal vertigo, left ear: Secondary | ICD-10-CM | POA: Diagnosis not present

## 2016-10-04 DIAGNOSIS — G43119 Migraine with aura, intractable, without status migrainosus: Secondary | ICD-10-CM | POA: Diagnosis not present

## 2016-10-04 MED ORDER — MECLIZINE HCL 32 MG PO TABS: 32.0000 mg | ORAL_TABLET | Freq: Three times a day (TID) | ORAL | 0 refills | Status: DC | PRN

## 2016-10-04 NOTE — Patient Instructions (Signed)

## 2016-10-04 NOTE — Progress Notes (Addendum)
Subjective:     Patient ID: Kendra Hall, female   DOB: 19-Mar-1966, 51 y.o.   MRN: [id number]  HPI Kendra Hall is a 51yo female presenting today for dizziness. Also notes worsening frequency of migraines and weight gain.  # Dizziness: Notes worsening dizziness over the last several days. Worse when turning her head, rolling over in bed. Denies dizziness with rising from sitting to standing. Lasts for a few seconds before resolving. Denies syncope. Denies chest pain or shortness of breath with episodes. Does not wish to attend therapy without first trying home maneuvers.  # Migraines: History of migraines. Has noticed same quality, but are becoming more frequent. Previously followed by Neurology, but she does not remember which office. Requests referral back to neurology to reestablish. Does not seem to be associated with above dizziness. Denies localized numbness or weakness.  Review of Systems Per HPI    Objective:   Physical Exam  Constitutional: She is oriented to person, place, and time. She appears well-developed and well-nourished. No distress.  Cardiovascular: Normal rate and regular rhythm.   No murmur heard. Pulmonary/Chest: Effort normal. No respiratory distress.  Abdominal: Soft. She exhibits no distension. There is no tenderness.  Neurological: She is alert and oriented to person, place, and time. No cranial nerve deficit.  Muscle strength 5/5 in upper and lower extremities. Positive Dix-Hallpike on Left  Psychiatric: She has a normal mood and affect. Her behavior is normal.      Assessment and Plan:     1. Benign paroxysmal positional vertigo of left ear Meclizine prescribed. Handout on home maneuvers given.  2. Intractable migraine with aura without status migrainosus Referral to neurology. Not present today, but increasing in frequency.

## 2016-10-07 ENCOUNTER — Telehealth: Payer: Self-pay | Admitting: Family Medicine

## 2016-10-07 DIAGNOSIS — F333 Major depressive disorder, recurrent, severe with psychotic symptoms: Secondary | ICD-10-CM

## 2016-10-07 DIAGNOSIS — F4310 Post-traumatic stress disorder, unspecified: Secondary | ICD-10-CM

## 2016-10-07 DIAGNOSIS — F411 Generalized anxiety disorder: Secondary | ICD-10-CM

## 2016-10-07 NOTE — Telephone Encounter (Signed)
Pt  calling to request refill of:  Name of Medication(s):  Lexapro Last date of OV: 10-04-16 Pharmacy:  Pharm on file is correct   Will route refill request to Clinic RN.  Discussed with patient policy to call pharmacy for future refills.  Also, discussed refills may take up to 48 hours to approve or deny.  Renella Cunas

## 2016-10-09 MED ORDER — ESCITALOPRAM OXALATE 20 MG PO TABS: ORAL_TABLET | ORAL | 3 refills | Status: DC

## 2016-10-09 NOTE — Telephone Encounter (Signed)
Lexapro refilled. 

## 2016-10-11 ENCOUNTER — Encounter: Payer: Self-pay | Admitting: Endocrinology

## 2016-10-11 ENCOUNTER — Ambulatory Visit (INDEPENDENT_AMBULATORY_CARE_PROVIDER_SITE_OTHER): Payer: 59 | Admitting: Endocrinology

## 2016-10-11 ENCOUNTER — Ambulatory Visit: Payer: Self-pay | Admitting: Endocrinology

## 2016-10-11 VITALS — BP 132/88 | HR 102 | Temp 98.3°F | Ht 63.0 in | Wt 201.1 lb

## 2016-10-11 DIAGNOSIS — E119 Type 2 diabetes mellitus without complications: Secondary | ICD-10-CM | POA: Diagnosis not present

## 2016-10-11 DIAGNOSIS — Z794 Long term (current) use of insulin: Secondary | ICD-10-CM

## 2016-10-11 LAB — POCT GLYCOSYLATED HEMOGLOBIN (HGB A1C): Hemoglobin A1C: 9.3

## 2016-10-11 MED ORDER — INSULIN LISPRO 100 UNIT/ML (KWIKPEN): 13.0000 [IU] | PEN_INJECTOR | Freq: Three times a day (TID) | SUBCUTANEOUS | 11 refills | Status: DC

## 2016-10-11 NOTE — Progress Notes (Signed)
Subjective:    Patient ID: Kendra Hall, female    DOB: 1966-02-17, 51 y.o.   MRN: [id number]  HPI Pt returns for f/u of diabetes mellitus: DM type: Insulin-requiring type 2 Dx'ed: 4709 Complications: none Therapy: insulin since 2018, and invokana GDM: 1984 DKA: never.   Severe hypoglycemia: never.  Pancreatitis: never.  Other: she takes multiple daily injections.  Interval history: pt states she feels well in general.  no cbg record, but states cbg's vary from 80-200's. It is in general higher as the day goes on.   Past Medical History:  Diagnosis Date  . Allergy   . Anxiety   . Asthma   . Depression   . Diabetes mellitus   . Fibromyalgia   . Gastric ulcer   . GERD (gastroesophageal reflux disease)   . Grave's disease   . Graves disease   . H/O therapeutic radiation   . Migraine   . Multiple thyroid nodules   . Myofacial muscle pain   . Osteoarthritis    Facet hypertrophy with injections  . TIA (transient ischemic attack)    02/2015    Past Surgical History:  Procedure Laterality Date  . AXILLARY LYMPH NODE DISSECTION  2001  . HYDRADENITIS EXCISION    . TUBAL LIGATION  1995    Social History   Social History  . Marital status: Single    Spouse name: N/A  . Number of children: 4  . Years of education: 14   Occupational History  . 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 Smoking status: Former Smoker    Packs/day: 0.50    Types: Cigarettes  . Smokeless tobacco: Never Used  . Alcohol use No     Comment: rare  . 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  Today denies all drug abuse  . Sexual activity: Not Currently    Birth control/ protection: Surgical   Other Topics Concern  . Not on file   Social History Narrative   Lives alone in [place]. Her daugther occassoinally comes and stay with her. Pt is working Therapist, art at [employer]. Enjoys reading and pt has an associates in applied sciences.  originally from [place],  [place].  Raised by mom and her sister who is 8 yrs older than pt. Pt has several half siblings. No longer on disability. Pt has 4 kids, never married.              Current Outpatient Prescriptions on File Prior to Visit  Medication Sig Dispense Refill  . albuterol (PROAIR HFA) 108 (90 Base) MCG/ACT inhaler Inhale 2 puffs into the lungs every 4 (four) hours as needed for wheezing. 1 Inhaler 5  . canagliflozin (INVOKANA) 100 MG TABS tablet Take 1 tablet (100 mg total) by mouth daily before breakfast. 90 tablet 3  . escitalopram (LEXAPRO) 20 MG tablet TAKE 1 TABLET(20 MG) BY MOUTH DAILY 90 tablet 3  . glucose blood (ACCU-CHEK AVIVA PLUS) test strip 1 each by Other route 4 (four) times daily. And lancets 4/day 360 each 3  . levothyroxine (SYNTHROID, LEVOTHROID) 125 MCG tablet TAKE 1 TABLET BY MOUTH DAILY 60 tablet 2  . linaclotide (LINZESS) 145 MCG CAPS capsule Take 145 mcg by mouth daily before breakfast.    . meclizine (ANTIVERT) 32 MG tablet Take 1 tablet (32 mg total)  by mouth 3 (three) times daily as needed. 30 tablet 0  . nortriptyline (PAMELOR) 25 MG capsule TAKE 3 CAPSULES(75 MG) BY MOUTH AT BEDTIME (Patient taking differently: TAKE 3 CAPSULES(75 MG) BY MOUTH AT BEDTIME AS NEEDED FOR SLEEP) 270 capsule 3  . Polyethyl Glycol-Propyl Glycol (SYSTANE OP) Apply 1-2 drops to eye daily as needed (dryness).    . promethazine (PHENERGAN) 25 MG tablet TAKE 1 TABLET(25 MG) BY MOUTH EVERY 8 HOURS AS NEEDED FOR NAUSEA OR VOMITING 30 tablet 0  . rizatriptan (MAXALT-MLT) 10 MG disintegrating tablet Take 1 tablet (10 mg total) by mouth as needed for migraine. May repeat in 2 hours if needed 12 tablet 1   No current facility-administered medications on file prior to visit.     Allergies  Allergen Reactions  . Ergotrate [Ergonovine] Other (See Comments)    Pt does not ever want  . Ibuprofen Other (See Comments)      Patient has ulcer  . Metformin And Related Other (See Comments)    Dizziness  . Yutopar [Ritodrine] Other (See Comments)    Pt does not ever want    Family History  Problem Relation Age of Onset  . Hypertension Mother   . Diabetes Mother   . Cancer Mother     rectal  . Heart disease Father 85    MI x5  . Hypertension Father   . Diabetes Father   . Cancer Sister 44    breast  . Drug abuse Brother   . Alcohol abuse Brother   . Drug abuse Brother   . Alcohol abuse Brother   . Anxiety disorder Neg Hx   . Bipolar disorder Neg Hx   . Depression Neg Hx     BP 132/88 (BP Location: Left Arm, Patient Position: Sitting, Cuff Size: Normal)   Pulse (!) 102   Temp 98.3 F (36.8 C) (Oral)   Ht 5\' 3"  (1.6 m)   Wt 201 lb 2 oz (91.2 kg)   SpO2 97%   BMI 35.63 kg/m    Review of Systems She denies hypoglycemia.      Objective:   Physical Exam VITAL SIGNS:  See vs page GENERAL: no distress Pulses: dorsalis pedis intact bilat.   MSK: no deformity of the feet CV: no leg edema Skin:  no ulcer on the feet.  normal color and temp on the feet. Neuro: sensation is intact to touch on the feet.     a1c=9.3%    Assessment & Plan:  Insulin-requiring type 2 DM: ongoing poor control.  She may need to change to a simpler insulin schedule, but we'll increase the same insulin for now  Patient Instructions  check your blood sugar once a day.  vary the time of day when you check, between before the 3 meals, and at bedtime.  also check if you have symptoms of your blood sugar being too high or too low.  please keep a record of the readings and bring it to your next appointment here (or you can bring the meter itself).  You can write it on any piece of paper.  please call us sooner if your blood sugar goes below 70, or if you have a lot of readings over 200.   Increase the insulin to 13 units 3 times a day (just before each meal), and:  Please continue the same invokana.   Please come back  for a follow-up appointment in 1 month.

## 2016-10-11 NOTE — Patient Instructions (Addendum)
check your blood sugar once a day.  vary the time of day when you check, between before the 3 meals, and at bedtime.  also check if you have symptoms of your blood sugar being too high or too low.  please keep a record of the readings and bring it to your next appointment here (or you can bring the meter itself).  You can write it on any piece of paper.  please call us sooner if your blood sugar goes below 70, or if you have a lot of readings over 200.   Increase the insulin to 13 units 3 times a day (just before each meal), and:  Please continue the same invokana.   Please come back for a follow-up appointment in 1 month.

## 2016-10-15 ENCOUNTER — Encounter: Payer: Self-pay | Admitting: Neurology

## 2016-10-15 ENCOUNTER — Other Ambulatory Visit: Payer: Self-pay | Admitting: Neurology

## 2016-10-15 ENCOUNTER — Telehealth: Payer: Self-pay | Admitting: Neurology

## 2016-10-15 ENCOUNTER — Ambulatory Visit (INDEPENDENT_AMBULATORY_CARE_PROVIDER_SITE_OTHER): Payer: 59 | Admitting: Neurology

## 2016-10-15 VITALS — BP 132/90 | HR 90 | Temp 98.3°F | Resp 18 | Ht 63.5 in | Wt 202.2 lb

## 2016-10-15 DIAGNOSIS — G43009 Migraine without aura, not intractable, without status migrainosus: Secondary | ICD-10-CM | POA: Diagnosis not present

## 2016-10-15 DIAGNOSIS — R413 Other amnesia: Secondary | ICD-10-CM | POA: Diagnosis not present

## 2016-10-15 DIAGNOSIS — F17210 Nicotine dependence, cigarettes, uncomplicated: Secondary | ICD-10-CM | POA: Diagnosis not present

## 2016-10-15 MED ORDER — RIZATRIPTAN BENZOATE 10 MG PO TBDP: ORAL_TABLET | ORAL | 11 refills | Status: DC

## 2016-10-15 MED ORDER — NAPROXEN 500 MG PO TABS: 500.0000 mg | ORAL_TABLET | Freq: Two times a day (BID) | ORAL | 2 refills | Status: DC | PRN

## 2016-10-15 NOTE — Progress Notes (Signed)
NEUROLOGY CONSULTATION NOTE  Kendra Hall MRN: [id number] DOB: 07/02/1965  Referring provider: Dr. Gerlean Ren Primary care provider: Dr. Gerlean Ren  Reason for consult:  Migraine, memory deficits  HISTORY OF PRESENT ILLNESS: Kendra Hall is a 51 year old female with type 2 diabetes mellitus, fibromyalgia, depression and hypothyroidism who presents for migraine.  History supplemented by PCP note.  Onset:  She has history of migraines since her 68s, but they have been every other day for the past 2 weeks.  Last migraine before then was 6 months prior. Location:  Varies: back of head, across forehead, temples Quality:  Varies: squeezing, sharp Intensity:  10/10 Aura:  no Prodrome:  no Postdrome:  no Associated symptoms:  Nausea, photophobia, phonophobia.  She has not had any new worse headache of her life, waking up from sleep Duration:  All day (usually lets up in 30 minutes after taking Maxalt, however lately headache returns in 2 hours) Frequency:  Every other day Frequency of abortive medication: every other day Triggers/exacerbating factors:  unknown Relieving factors:  sleep Activity:  aggravates  Past NSAIDS:  ibuprofen Past analgesics:  Tylenol, Excedrin Migraine Past abortive triptans:  Sumatriptan tablet Past muscle relaxants:  no Past anti-emetic:  no Past antihypertensive medications:  no Past antidepressant medications:  sertraline 50mg  Past anticonvulsant medications:  no Past vitamins/Herbal/Supplements:  no Other past therapies:  no  Current NSAIDS:  no Current analgesics:  no Current triptans:  Maxalt-MLT 10mg  Current anti-emetic:  promethazine 25mg  Current muscle relaxants:  no Current anti-anxiolytic:  no Current sleep aide:  nortriptyline 75mg  as needed Current Antihypertensive medications:  no Current Antidepressant medications:  nortriptyline 75mg  as needed for sleep (cannot take nightly due to extreme dry mouth), Lexapro 20mg  Current Anticonvulsant  medications:  Topiramate (does not remember dose.  Stopped taking it a long time ago but started taking it again 2 weeks ago when headaches returned). Current Vitamins/Herbal/Supplements:  no Current Antihistamines/Decongestants:  no Other therapy:  no  Caffeine:  no Alcohol:  no Smoker:  yes Diet:  hydrates Exercise:  Not routine Depression/anxiety:  stable Sleep hygiene:  good Family history of headache:  cousins  MRI and MRA of head from 03/15/15 were personally reviewed and were unremarkable except for minimal punctate white matter foci.  09/23/16 LABS: CBC with WBC 7, HGB 13.6, HCT 42.2 and PLT 304; BMP with Na 140, K 3.8, Cl 103, CO2 27, glucose 95, BUN 14 and Cr 0.91.  Since headaches returned, she reports some memory deficits. She will forget conversations or have word-finding difficulty.  It does not affect her ability to perform ADLs.  PAST MEDICAL HISTORY: Past Medical History:  Diagnosis Date  . Allergy   . Anxiety   . Asthma   . Depression   . Diabetes mellitus   . Fibromyalgia   . Gastric ulcer   . GERD (gastroesophageal reflux disease)   . Grave's disease   . Graves disease   . H/O therapeutic radiation   . Migraine   . Multiple thyroid nodules   . Myofacial muscle pain   . Osteoarthritis    Facet hypertrophy with injections  . TIA (transient ischemic attack)    02/2015    PAST SURGICAL HISTORY: Past Surgical History:  Procedure Laterality Date  . AXILLARY LYMPH NODE DISSECTION  2001  . HYDRADENITIS EXCISION    . TUBAL LIGATION  1995    MEDICATIONS: Current Outpatient Prescriptions on File Prior to Visit  Medication Sig Dispense Refill  . albuterol (  PROAIR HFA) 108 (90 Base) MCG/ACT inhaler Inhale 2 puffs into the lungs every 4 (four) hours as needed for wheezing. 1 Inhaler 5  . canagliflozin (INVOKANA) 100 MG TABS tablet Take 1 tablet (100 mg total) by mouth daily before breakfast. 90 tablet 3  . escitalopram (LEXAPRO) 20 MG tablet TAKE 1 TABLET(20  MG) BY MOUTH DAILY 90 tablet 3  . glucose blood (ACCU-CHEK AVIVA PLUS) test strip 1 each by Other route 4 (four) times daily. And lancets 4/day 360 each 3  . insulin lispro (HUMALOG KWIKPEN) 100 UNIT/ML KiwkPen Inject 0.13 mLs (13 Units total) into the skin 3 (three) times daily with meals. And pen needles 3/day 15 mL 11  . levothyroxine (SYNTHROID, LEVOTHROID) 125 MCG tablet TAKE 1 TABLET BY MOUTH DAILY 60 tablet 2  . linaclotide (LINZESS) 145 MCG CAPS capsule Take 145 mcg by mouth daily before breakfast.    . nortriptyline (PAMELOR) 25 MG capsule TAKE 3 CAPSULES(75 MG) BY MOUTH AT BEDTIME (Patient taking differently: TAKE 3 CAPSULES(75 MG) BY MOUTH AT BEDTIME AS NEEDED FOR SLEEP) 270 capsule 3  . Polyethyl Glycol-Propyl Glycol (SYSTANE OP) Apply 1-2 drops to eye daily as needed (dryness).    . promethazine (PHENERGAN) 25 MG tablet TAKE 1 TABLET(25 MG) BY MOUTH EVERY 8 HOURS AS NEEDED FOR NAUSEA OR VOMITING 30 tablet 0  . meclizine (ANTIVERT) 32 MG tablet Take 1 tablet (32 mg total) by mouth 3 (three) times daily as needed. (Patient not taking: Reported on 10/15/2016) 30 tablet 0   No current facility-administered medications on file prior to visit.     ALLERGIES: Allergies  Allergen Reactions  . Ergotrate [Ergonovine] Other (See Comments)    Pt does not ever want  . Ibuprofen Other (See Comments)    Patient has ulcer  . Metformin And Related Other (See Comments)    Dizziness  . Yutopar [Ritodrine] Other (See Comments)    Pt does not ever want    FAMILY HISTORY: Family History  Problem Relation Age of Onset  . Hypertension Mother   . Diabetes Mother   . Cancer Mother     rectal  . Heart disease Father 47    MI x5  . Hypertension Father   . Diabetes Father   . Cancer Sister 12    breast  . Drug abuse Brother   . Alcohol abuse Brother   . Drug abuse Brother   . Alcohol abuse Brother   . Anxiety disorder Neg Hx   . Bipolar disorder Neg Hx   . Depression Neg Hx     SOCIAL  HISTORY: Social History   Social History  . Marital status: Single    Spouse name: N/A  . Number of children: 4  . Years of education: 14   Occupational History  . customer service [place] History Main Topics  . Smoking status: Former Smoker    Packs/day: 0.50    Types: Cigarettes  . Smokeless tobacco: Never Used  . Alcohol use No     Comment: rare  . 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  Today denies all drug abuse  . Sexual activity: Not Currently    Birth control/ protection: Surgical   Other Topics Concern  . Not on file   Social History Narrative   Lives alone in [place]. Her daugther occassoinally comes and stay  with her. 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  originally from [place],  [place].  Raised by mom and her sister who is 8 yrs older than pt. Pt has several half siblings. No longer on disability. Pt has 4 kids, never married.              REVIEW OF SYSTEMS: Constitutional: No fevers, chills, or sweats, no generalized fatigue, change in appetite Eyes: No visual changes, double vision, eye pain Ear, nose and throat: No hearing loss, ear pain, nasal congestion, sore throat Cardiovascular: No chest pain, palpitations Respiratory:  No shortness of breath at rest or with exertion, wheezes GastrointestinaI: No nausea, vomiting, diarrhea, abdominal pain, fecal incontinence Genitourinary:  No dysuria, urinary retention or frequency Musculoskeletal:  No neck pain, back pain Integumentary: No rash, pruritus, skin lesions Neurological: as above Psychiatric: No depression, insomnia, anxiety Endocrine: No palpitations, fatigue, diaphoresis, mood swings, change in appetite, change in weight, increased thirst Hematologic/Lymphatic:  No purpura, petechiae. Allergic/Immunologic: no itchy/runny  eyes, nasal congestion, recent allergic reactions, rashes  PHYSICAL EXAM: Vitals:   10/15/16 1309  BP: 132/90  Pulse: 90  Resp: 18  Temp: 98.3 F (36.8 C)   General: No acute distress.  Patient appears well-groomed.  Head:  Normocephalic/atraumatic Eyes:  fundi examined but not visualized Neck: supple, no paraspinal tenderness, full range of motion Back: No paraspinal tenderness Heart: regular rate and rhythm Lungs: Clear to auscultation bilaterally. Vascular: No carotid bruits. Neurological Exam: Mental status: alert and oriented to person, place, and time, recent and remote memory intact, fund of knowledge intact, attention and concentration intact, speech fluent and not dysarthric, language intact. MMSE - Mini Mental State Exam 10/15/2016  Orientation to time 5  Orientation to Place 5  Registration 3  Attention/ Calculation 5  Recall 3  Language- name 2 objects 2  Language- repeat 1  Language- follow 3 step command 3  Language- read & follow direction 1  Write a sentence 1  Copy design 1  Total score 30   Cranial nerves: CN I: not tested CN II: pupils equal, round and reactive to light, visual fields intact CN III, IV, VI:  full range of motion, no nystagmus, no ptosis CN V: facial sensation intact CN VII: upper and lower face symmetric CN VIII: hearing intact CN IX, X: gag intact, uvula midline CN XI: sternocleidomastoid and trapezius muscles intact CN XII: tongue midline Bulk & Tone: normal, no fasciculations. Motor:  5/5 throughout  Sensation: temperature and vibration sensation intact. Deep Tendon Reflexes:  2+ throughout, toes downgoing.  Finger to nose testing:  Without dysmetria.  Heel to shin:  Without dysmetria.  Gait:  Normal station and stride.  Able to turn and tandem walk. Romberg negative.  IMPRESSION: Migraines Memory deficits.  No cognitive impairment appreciated. Possibly side effect of topiramate since it started after she restarted  topiramate. Tobacco use  PLAN: 1.  She will find out the dose of her topiramate and I will increase it.  She will contact me in 4 weeks with update.  If memory continues to be an issue, we can try switching to extended release topiramate. 2.  For abortive therapy, take Maxalt MLT 10mg  with naproxen 500mg  3.  Lifestyle modification:  Sleep hygiene, hydration, exercise, smoking cessation. 4.  Follow up in 3 months.  Thank you for allowing me to take part in the care of this patient.  Metta Clines, DO  CC:  [facility], DO

## 2016-10-15 NOTE — Telephone Encounter (Signed)
Pt said she is on 25mg  of Topamax

## 2016-10-15 NOTE — Patient Instructions (Signed)
Migraine Recommendations: 1.  Let us know what dose of topamax you are taking and I will increase the dose.   2.  Take Maxalt MLT 10mg  WITH naproxen 500mg  at earliest onset of headache.  May repeat dose once in 2 hours if needed.  Do not exceed two doses in 24 hours. 3.  Limit use of pain relievers to no more than 2 days out of the week.  These medications include acetaminophen, ibuprofen, triptans and narcotics.  This will help reduce risk of rebound headaches. 4.  Be aware of common food triggers such as processed sweets, processed foods with nitrites (such as deli meat, hot dogs, sausages), foods with MSG, alcohol (such as wine), chocolate, certain cheeses, certain fruits (dried fruits, some citrus fruit), vinegar, diet soda. 4.  Avoid caffeine 5.  Routine exercise 6.  Proper sleep hygiene 7.  Stay adequately hydrated with water 8.  Keep a headache diary. 9.  Maintain proper stress management. 10.  Do not skip meals. 11.  Consider supplements:  Magnesium citrate 400mg  to 600mg  daily, riboflavin 400mg , Coenzyme Q 10 100mg  three times daily 12.  Follow up in 3 months but contact me in 4 weeks with update and we can increase dose of topamax if needed.

## 2016-10-16 NOTE — Telephone Encounter (Signed)
Kendra Hall said the insurance denied the Maxalt.  Pre auth needed.  She said the Topamax needs to be 100 mg.

## 2016-10-16 NOTE — Telephone Encounter (Signed)
Also, I want to get pre authorization for the Maxalt MLT because she has tried many triptans already that were not effective and the dissolvable Maxalt is effective.

## 2016-10-16 NOTE — Telephone Encounter (Signed)
Increase topiramate to 50mg  at bedtime for 7 days, then 100mg  at bedtime.

## 2016-10-17 NOTE — Telephone Encounter (Signed)
Clld [employer] Prior Auth - [id number]  [place]/CSR  Initiated prior [place] -  [place] - [phone]  Faxed office notes as well.

## 2016-10-17 NOTE — Telephone Encounter (Deleted)
Duplicate

## 2016-10-17 NOTE — Telephone Encounter (Signed)
I contacted the patient called to report on 10/16/2016 she took two dosages of the invokana. Patient stated she took a dosage in the am yesterday and in the pm. Patient stated at 1243 am her blood sugar was 83. Pateint called the nurse triage line last night and was advised to skip her invokana dosage this morning. Patient wanted to know if you had any other instructions for her at this time? Please advise, Thanks!

## 2016-10-17 NOTE — Telephone Encounter (Signed)
Just wanted to clarify, should the patient resume the invokana today or tomorrow am?

## 2016-10-17 NOTE — Telephone Encounter (Signed)
Today is fine

## 2016-10-17 NOTE — Telephone Encounter (Signed)
Patient stated that she took accidentally  Took morning invokanna this evening, her b/s is 40 currently.   Please advise

## 2016-10-17 NOTE — Telephone Encounter (Signed)
I contacted the patient and advised of MD's message. She voiced understanding and had no further questions at this time.

## 2016-10-17 NOTE — Telephone Encounter (Signed)
Please resume the invokana, and reduce the insulin to 12 units 3 times a day (just before each meal)

## 2016-10-18 ENCOUNTER — Encounter: Payer: Self-pay | Admitting: Dietician

## 2016-10-18 ENCOUNTER — Encounter: Payer: 59 | Attending: Endocrinology | Admitting: Dietician

## 2016-10-18 DIAGNOSIS — Z713 Dietary counseling and surveillance: Secondary | ICD-10-CM | POA: Diagnosis not present

## 2016-10-18 DIAGNOSIS — Z794 Long term (current) use of insulin: Secondary | ICD-10-CM

## 2016-10-18 DIAGNOSIS — E119 Type 2 diabetes mellitus without complications: Secondary | ICD-10-CM | POA: Diagnosis not present

## 2016-10-18 NOTE — Patient Instructions (Signed)
Reconsider the honey. Continue to choose beverages without sugar. Continue to stay active.  Aim for 30 minutes most days. Balance out the carbohydrates throughout the day rather than mostly in the morning. Small amounts of protein with each meal or snack. Eat at a table or the living room without TV. Consider seeing a counselor (discuss night eating).   Aim for 3 Carb Choices per meal (45 grams) +/- 1 either way  Aim for 0-1 Carbs per snack if hungry  Consider reading food labels for Total Carbohydrate and Fat Grams of foods Continue checking BG at alternate times per day as directed by MD  Continue taking medication as directed by MD

## 2016-10-18 NOTE — Telephone Encounter (Signed)
Received fax from [facility]  - Prior authorization is not required at this time due to the medication being on the patient's plan's list of covered drugs.  Clld  - advsd Biomedical engineer.  She adsvd she will have to order type pt's insurance will pay for - ins will not pay for how the medication is packaged of the kind they have in stock.  Quentin Mulling once the pharmacy receives the medication they will contact the pt.

## 2016-10-18 NOTE — Progress Notes (Signed)
Diabetes Self-Management Education  Visit Type: First/Initial  Appt. Start Time: 0805 Appt. End Time: 0930  10/18/2016  Ms. Kendra Hall, identified by name and date of birth, is a 51 y.o. female with a diagnosis of Diabetes: Type 2. Other history includes GDM, GERD, bipolar disorder, she also reports sleep eating and does not remember doing so until seeing evidence the next morning. She is no longer followed by a psychiatrist or counselor. She is receptive to new MD and counselor referrals. She has been restricting carbohydrates from dinner into the evening with very high carbohydrate intake each morning. Her blood sugar levels are highest in the morning/fasting.   Medications include: Invokana, Humaolog 12 units with each meal. States that she is terrified of needles but has been taking her insulin as prescribed as well as checking her blood sugar several times per day.   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   ASSESSMENT  Height 5' 3.5" (1.613 m), weight 205 lb (93 kg). Body mass index is 35.74 kg/m.      Diabetes Self-Management Education - 10/18/16 8101      Visit Information   Visit Type First/Initial     Initial Visit   Diabetes Type Type 2   Are you currently following a meal plan? Yes   What type of meal plan do you follow? the same thing everyday   Are you taking your medications as prescribed? Yes   Date Diagnosed 2012     Health Coping   How would you rate your overall health? Good     Psychosocial Assessment   Patient Belief/Attitude about Diabetes Motivated to manage diabetes   Self-care barriers None   Self-management support Doctor's office   Other persons present Patient   Patient Concerns Nutrition/Meal planning;Glycemic Control   Special Needs None   Preferred Learning Style No preference indicated   Learning Readiness Ready   How often do you need to  have someone help you when you read instructions, pamphlets, or other written materials from your doctor or pharmacy? 1 - Never   What is the last grade level you completed in school? 2 years college     Pre-Education Assessment   Patient understands the diabetes disease and treatment process. Needs Review   Patient understands incorporating nutritional management into lifestyle. Needs Review   Patient undertands incorporating physical activity into lifestyle. Needs Review   Patient understands using medications safely. Needs Review   Patient understands monitoring blood glucose, interpreting and using results Needs Review   Patient understands prevention, detection, and treatment of acute complications. Needs Review   Patient understands prevention, detection, and treatment of chronic complications. Needs Review   Patient understands how to develop strategies to address psychosocial issues. Needs Review   Patient understands how to develop strategies to promote health/change behavior. Needs Review     Complications   Last HgB A1C per patient/outside source 9.3 %  10/11/16 decreased from 12/2% 08/02/16   How often do you check your blood sugar? 3-4 times/day   Fasting Blood glucose range (mg/dL) 180-200;>200;70-129;130-179   Postprandial Blood glucose range (mg/dL) 130-179;180-200   Number of hypoglycemic episodes per month 0   Number of hyperglycemic episodes per week 7   Can you tell when your blood sugar is high? Yes   What do you do if your blood sugar is high? drink water   Have you had a dilated eye  exam in the past 12 months? No  appointment May 8   Have you had a dental exam in the past 12 months? Yes   Are you checking your feet? Yes   How many days per week are you checking your feet? 7     Dietary Intake   Breakfast instant oatmeal OR bagel with whipped margarine, pear AND strawberries- SKIPS 2-3 times per week  9:30   Snack (morning) strawberries or other fruit (pear or  mandarine orange)   Lunch [place] Strawberry Pecan side salad with grilled chicken or other protein OR something that she has meal prepped (Spaghetti or chicken wings and greens, pintos)  3-4   Dinner Salad and chicken, other vegetables  8 -9 pm   Snack (evening) chips  eats them without knowing until evidence found in the morning   Beverage(s) water, lemon water, half and half tea,      Exercise   Exercise Type Light (walking / raking leaves)   How many days per week to you exercise? 3   How many minutes per day do you exercise? 60   Total minutes per week of exercise 180     Patient Education   Previous Diabetes Education Yes (please comment)  NDES   Disease state  Other (comment)  Reviewed Insulin Resistance    Nutrition management  Role of diet in the treatment of diabetes and the relationship between the three main macronutrients and blood glucose level;Food label reading, portion sizes and measuring food.;Information on hints to eating out and maintain blood glucose control.;Meal options for control of blood glucose level and chronic complications.   Physical activity and exercise  Role of exercise on diabetes management, blood pressure control and cardiac health.   Medications Reviewed patients medication for diabetes, action, purpose, timing of dose and side effects.   Monitoring Identified appropriate SMBG and/or A1C goals.;Other (comment)  Intoduced freestyle libre as alternative option for SBG   Acute complications Taught treatment of hypoglycemia - the 15 rule.   Chronic complications Relationship between chronic complications and blood glucose control;Retinopathy and reason for yearly dilated eye exams   Psychosocial adjustment Role of stress on diabetes;Worked with patient to identify barriers to care and solutions;Brainstormed with patient on coping mechanisms for social situations, getting support from significant others, dealing with feelings about diabetes;Identified and  addressed patients feelings and concerns about diabetes   Personal strategies to promote health Lifestyle issues that need to be addressed for better diabetes care     Individualized Goals (developed by patient)   Nutrition General guidelines for healthy choices and portions discussed   Physical Activity Exercise 5-7 days per week;30 minutes per day   Medications take my medication as prescribed   Monitoring  test my blood glucose as discussed   Problem Solving Sleep Eating, balance of carbohydrates    Reducing Risk do foot checks daily;treat hypoglycemia with 15 grams of carbs if blood glucose less than 70mg /dL   Health Coping discuss diabetes with (comment)  MD, RD, CDE      Post-Education Assessment   Patient understands the diabetes disease and treatment process. Demonstrates understanding / competency   Patient understands incorporating nutritional management into lifestyle. Demonstrates understanding / competency   Patient undertands incorporating physical activity into lifestyle. Demonstrates understanding / competency   Patient understands using medications safely. Demonstrates understanding / competency   Patient understands monitoring blood glucose, interpreting and using results Demonstrates understanding / competency   Patient understands prevention, detection, and treatment  of acute complications. Demonstrates understanding / competency   Patient understands prevention, detection, and treatment of chronic complications. Demonstrates understanding / competency   Patient understands how to develop strategies to address psychosocial issues. Demonstrates understanding / competency   Patient understands how to develop strategies to promote health/change behavior. Demonstrates understanding / competency     Outcomes   Expected Outcomes Demonstrated interest in learning. Expect positive outcomes   Future DMSE 4-6 wks   Program Status Completed      Individualized Plan for  Diabetes Self-Management Training:   Learning Objective:  Patient will have a greater understanding of diabetes self-management. Patient education plan is to attend individual and/or group sessions per assessed needs and concerns.   Plan:   Patient Instructions  Reconsider the honey. Continue to choose beverages without sugar. Continue to stay active.  Aim for 30 minutes most days. Balance out the carbohydrates throughout the day rather than mostly in the morning. Small amounts of protein with each meal or snack. Eat at a table or the living room without TV. Consider seeing a counselor (discuss night eating).   Aim for 3 Carb Choices per meal (45 grams) +/- 1 either way  Aim for 0-1 Carbs per snack if hungry  Consider reading food labels for Total Carbohydrate and Fat Grams of foods Continue checking BG at alternate times per day as directed by MD  Continue taking medication as directed by MD      Expected Outcomes:  Demonstrated interest in learning. Expect positive outcomes  Education material provided: Living Well with Diabetes, Food label handouts, A1C conversion sheet, Meal plan card and Snack sheet, building a balanced meal, healthy eating when dining out, resource sheets for psychiatrists and counselors that are approved by her insurance.    If problems or questions, patient to contact team via:  Phone  Future DSME appointment: 4-6 wks

## 2016-10-31 ENCOUNTER — Other Ambulatory Visit: Payer: Self-pay | Admitting: Gynecology

## 2016-11-04 ENCOUNTER — Telehealth: Payer: Self-pay | Admitting: Endocrinology

## 2016-11-04 NOTE — Telephone Encounter (Signed)
Spoke with the patient and she stated an understanding

## 2016-11-04 NOTE — Telephone Encounter (Signed)
Pt's BS dropped down to 38 on Saturday night and she called EMS  Should the pt change her insulin dosing

## 2016-11-04 NOTE — Telephone Encounter (Signed)
reduce the insulin to 10 units 3 times a day (just before each meal). I'll see you next week.

## 2016-11-06 ENCOUNTER — Encounter: Payer: Self-pay | Admitting: Gynecology

## 2016-11-12 ENCOUNTER — Encounter: Payer: Self-pay | Admitting: Endocrinology

## 2016-11-12 ENCOUNTER — Ambulatory Visit (INDEPENDENT_AMBULATORY_CARE_PROVIDER_SITE_OTHER): Payer: 59 | Admitting: Endocrinology

## 2016-11-12 VITALS — BP 126/106 | HR 83 | Ht 63.5 in | Wt 206.0 lb

## 2016-11-12 DIAGNOSIS — E119 Type 2 diabetes mellitus without complications: Secondary | ICD-10-CM

## 2016-11-12 DIAGNOSIS — Z794 Long term (current) use of insulin: Secondary | ICD-10-CM | POA: Diagnosis not present

## 2016-11-12 MED ORDER — GLUCOSE BLOOD VI STRP: 1.0000 | ORAL_STRIP | Freq: Two times a day (BID) | 12 refills | Status: DC

## 2016-11-12 NOTE — Patient Instructions (Addendum)
check your blood sugar once a day.  vary the time of day when you check, between before the 3 meals, and at bedtime.  also check if you have symptoms of your blood sugar being too high or too low.  please keep a record of the readings and bring it to your next appointment here (or you can bring the meter itself).  You can write it on any piece of paper.  please call us sooner if your blood sugar goes below 70, or if you have a lot of readings over 200.   blood tests are requested for you today.  We'll let you know about the results. Please see a foot specialist.  you will receive a phone call, about a day and time for an appointment. here is a new meter.  I have sent a prescription to your pharmacy, for strips.  Please come back for a follow-up appointment in 2 months Please have your blood pressure rechecked soon, with your PCP.

## 2016-11-12 NOTE — Progress Notes (Signed)
Subjective:    Patient ID: Kendra [place], female    DOB: 1965-11-17, 51 y.o.   MRN: [id number]  HPI Pt returns for f/u of diabetes mellitus: DM type: Insulin-requiring type 2 Dx'ed: 4562 Complications: none Therapy: insulin since 2018, and invokana GDM: 1984 DKA: never.   Severe hypoglycemia: once, in 2018 Pancreatitis: never.  Other: she takes multiple daily injections.  Interval history: humalog was reduced to 10 units 3 times a day (just before each meal), due to severe hypoglycemia.  this happened at 11 pm.  approx 2-3 hrs after supper.  She had taken humalog just before supper.  Since on 10 units 3 times a day (just before each meal), she says cbg's vary from 91-150.  She says she never misses the insulin.  Past Medical History:  Diagnosis Date  . Allergy   . Anxiety   . Asthma   . Depression   . Diabetes mellitus   . Fibromyalgia   . Gastric ulcer   . GERD (gastroesophageal reflux disease)   . Grave's disease   . Graves disease   . H/O therapeutic radiation   . Migraine   . Multiple thyroid nodules   . Myofacial muscle pain   . Osteoarthritis    Facet hypertrophy with injections  . TIA (transient ischemic attack)    02/2015    Past Surgical History:  Procedure Laterality Date  . AXILLARY LYMPH NODE DISSECTION  2001  . HYDRADENITIS EXCISION    . TUBAL LIGATION  1995    Social History   Social History  . Marital status: Single    Spouse name: N/A  . Number of children: 4  . Years of education: 14   Occupational History  . customer service [employer] History Main Topics  . Smoking status: Former Smoker    Packs/day: 0.50    Types: Cigarettes  . Smokeless tobacco: Never Used  . Alcohol use No     Comment: rare  . 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  Today denies all drug abuse  . Sexual activity: Not Currently    Birth control/ protection: Surgical    Other Topics Concern  . Not on file   Social History Narrative   Lives alone in [place]. Her daugther occassoinally comes and stay with her. 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  originally from [place],  [place].  Raised by mom and her sister who is 8 yrs older than pt. Pt has several half siblings. No longer on disability. Pt has 4 kids, never married.              Current Outpatient Prescriptions on File Prior to Visit  Medication Sig Dispense Refill  . albuterol (PROAIR HFA) 108 (90 Base) MCG/ACT inhaler Inhale 2 puffs into the lungs every 4 (four) hours as needed for wheezing. 1 Inhaler 5  . canagliflozin (INVOKANA) 100 MG TABS tablet Take 1 tablet (100 mg total) by mouth daily before breakfast. 90 tablet 3  . escitalopram (LEXAPRO) 20 MG tablet TAKE 1 TABLET(20 MG) BY MOUTH DAILY 90 tablet 3  . glucose blood (ACCU-CHEK AVIVA PLUS) test strip 1 each by Other route 4 (four) times daily. And lancets 4/day 360 each 3  . insulin lispro (HUMALOG KWIKPEN) 100 UNIT/ML KiwkPen Inject  0.13 mLs (13 Units total) into the skin 3 (three) times daily with meals. And pen needles 3/day 15 mL 11  . levothyroxine (SYNTHROID, LEVOTHROID) 125 MCG tablet TAKE 1 TABLET BY MOUTH DAILY 60 tablet 2  . linaclotide (LINZESS) 145 MCG CAPS capsule Take 145 mcg by mouth daily before breakfast.    . meclizine (ANTIVERT) 32 MG tablet Take 1 tablet (32 mg total) by mouth 3 (three) times daily as needed. 30 tablet 0  . naproxen (NAPROSYN) 500 MG tablet TAKE 1 TABLET(500 MG) BY MOUTH EVERY 12 HOURS AS NEEDED 180 tablet 2  . nortriptyline (PAMELOR) 25 MG capsule TAKE 3 CAPSULES(75 MG) BY MOUTH AT BEDTIME (Patient taking differently: TAKE 3 CAPSULES(75 MG) BY MOUTH AT BEDTIME AS NEEDED FOR SLEEP) 270 capsule 3  . Polyethyl Glycol-Propyl Glycol (SYSTANE OP) Apply 1-2 drops to eye daily as needed (dryness).    . promethazine (PHENERGAN) 25 MG  tablet TAKE 1 TABLET(25 MG) BY MOUTH EVERY 8 HOURS AS NEEDED FOR NAUSEA OR VOMITING 30 tablet 0  . rizatriptan (MAXALT-MLT) 10 MG disintegrating tablet Take 1 tablet earliest onset of headache.  May repeat once in 2 hours if needed 9 tablet 11   No current facility-administered medications on file prior to visit.     Allergies  Allergen Reactions  . Ergotrate [Ergonovine] Other (See Comments)    Pt does not ever want  . Ibuprofen Other (See Comments)    Patient has ulcer  . Metformin And Related Other (See Comments)    Dizziness  . Yutopar [Ritodrine] Other (See Comments)    Pt does not ever want    Family History  Problem Relation Age of Onset  . Hypertension Mother   . Diabetes Mother   . Cancer Mother        rectal  . Heart disease Father 55       MI x5  . Hypertension Father   . Diabetes Father   . Cancer Sister 40       breast  . Drug abuse Brother   . Alcohol abuse Brother   . Drug abuse Brother   . Alcohol abuse Brother   . Anxiety disorder Neg Hx   . Bipolar disorder Neg Hx   . Depression Neg Hx     BP (!) 126/106   Pulse 83   Ht 5' 3.5" (1.613 m)   Wt 206 lb (93.4 kg)   SpO2 96%   BMI 35.92 kg/m    Review of Systems She has gained weight.     Objective:   Physical Exam VITAL SIGNS:  See vs page.  GENERAL: no distress Pulses: dorsalis pedis intact bilat.   MSK: no deformity of the feet CV: no leg edema Skin:  no ulcer on the feet.  normal color and temp on the feet.  There is a heavy callus at the plantar aspect of the left foot.  Neuro: sensation is intact to touch on the feet.       Assessment & Plan:  Insulin-requiring type 2 DM: uncertain control.  Check fructosamine.  Plan will be to change to qd insulin if it is high Hypoglycemia, severe, new, uncertain etiology.  HTN: f/u is needed.  See PCP to f/u this

## 2016-11-14 ENCOUNTER — Telehealth: Payer: Self-pay | Admitting: Endocrinology

## 2016-11-14 LAB — FRUCTOSAMINE: Fructosamine: 250 umol/L (ref 190–270)

## 2016-11-14 NOTE — Telephone Encounter (Signed)
Patient requests to be put on Trulicity. Please call and advise patient, okay to leave a detailed message.

## 2016-11-14 NOTE — Telephone Encounter (Signed)
please call patient: We got the results from your blood test.  This is like an a1c of 6.1%, so you can reduce the insulin to 8 units 3 times a day (just before each meal).  I'm happy we can reduce the medication now.  When you came back, we can discuss options.  Feel free to move up your next appt if you want.

## 2016-11-15 NOTE — Telephone Encounter (Signed)
Gave patient the results and advice and she stated an understanding

## 2016-11-22 ENCOUNTER — Ambulatory Visit: Payer: 59 | Admitting: Dietician

## 2016-11-28 ENCOUNTER — Encounter: Payer: Self-pay | Admitting: Student

## 2016-11-28 ENCOUNTER — Ambulatory Visit (INDEPENDENT_AMBULATORY_CARE_PROVIDER_SITE_OTHER): Payer: 59 | Admitting: Student

## 2016-11-28 DIAGNOSIS — M791 Myalgia, unspecified site: Secondary | ICD-10-CM

## 2016-11-28 NOTE — Assessment & Plan Note (Signed)
Trigger point injection of bilateral shoulders performed in clinic - return precautions discusses, will follow as needed

## 2016-11-28 NOTE — Progress Notes (Signed)
   Subjective:    Patient ID: Kendra [place], female    DOB: 04-20-1966, 51 y.o.   MRN: [id number]   CC: bilateral shoulder tightness  HPI: 51 y/o F presents for bilateral shoulder tightness  Shoulder tightness - she reports a history of trigger points in her shoulders and feels she has these now - she had had lidocaine injections for this in past and requests this again -   Smoking status reviewed  Review of Systems  Per HPI, else denies  chest pain, shortness of breath,     Objective:  BP 130/69   Pulse 98   Temp 99.1 F (37.3 C) (Oral)   Wt 200 lb (90.7 kg)   SpO2 99%   BMI 34.87 kg/m  Vitals and nursing note reviewed  General: NAD Cardiac: RRR,  Respiratory: CTAB, normal effort MSK: tight and tender trapezius muscles bilaterally Skin: warm and dry, no rashes noted Neuro: alert and oriented, no focal deficits  PROCEDURE Trigger Point injection  After written informed consent was obtained, the right shoulder was cleaned with Povidone iodine then anesthetized with Pain Ease spray then injected with 1 ml of 2% lidocaine without epinephrine at three points over the trapezius muscle. Good hemostasis was noted and the injection sites were dressed with bandaids. This was then repeated on the left shoulder in three points. Again good hemostasis was achieved and the injection sites were dressed with bandaids   Assessment & Plan:    Trigger point Trigger point injection of bilateral shoulders performed in clinic - return precautions discusses, will follow as needed    Kendra [place] A. Lincoln Brigham MD, [facility] Resident PGY-3 Pager [phone]

## 2016-11-28 NOTE — Patient Instructions (Signed)
Follow up as needed Your pain should start to improve  Call the office with questions or concerns

## 2016-12-03 ENCOUNTER — Telehealth: Payer: Self-pay | Admitting: Family Medicine

## 2016-12-03 NOTE — Telephone Encounter (Signed)
See message. Could you review and advise if this would be appropriate for the patient during Dr. Cordelia Pen absence? Thanks!

## 2016-12-03 NOTE — Telephone Encounter (Signed)
She will need to talk to Dr Loanne Drilling

## 2016-12-03 NOTE — Telephone Encounter (Signed)
Patient of Dr. Loanne Drilling wanting to try a script for Saxenda. She's never used it befoe but wants to know if he thinks she would benefit.  It's ok to leave a detailed message.   Thank you,  -LL

## 2016-12-03 NOTE — Telephone Encounter (Signed)
I do not usually Rx this >> will fwd to Dr. Dwyane Dee.

## 2016-12-04 MED ORDER — DULAGLUTIDE 0.75 MG/0.5ML ~~LOC~~ SOAJ: 0.7500 mg | SUBCUTANEOUS | 11 refills | Status: DC

## 2016-12-04 NOTE — Telephone Encounter (Signed)
The saxenda is outside the scope of my practice. It is a high dosage of victoza, which is similar to trulicity.  I have sent a prescription to your pharmacy, to start trulicity.  This will probably cause you to need less insulin, so please reduce to 4 units 3 times a day (just before each meal).  I'll see you next time.

## 2016-12-04 NOTE — Telephone Encounter (Signed)
Dr. Loanne Drilling, See messages and please advise, Thanks!

## 2016-12-04 NOTE — Telephone Encounter (Signed)
Patient notified of message and voiced understanding. She had no further questions at this time.  

## 2016-12-11 ENCOUNTER — Telehealth: Payer: Self-pay | Admitting: *Deleted

## 2016-12-11 MED ORDER — MEGESTROL ACETATE 40 MG PO TABS: ORAL_TABLET | ORAL | 0 refills | Status: DC

## 2016-12-11 NOTE — Telephone Encounter (Signed)
Pt had ablation on 07/25/16 no cycle since ablation, states last night she coughed and had a gush and started bleeding, went to bathroom and had heavy bleeding twice last night with clots, today bleeding is still heavy with cramping,changing pad every 1hour and a half. Please advise

## 2016-12-11 NOTE — Telephone Encounter (Signed)
Rx sent,transfered to front desk to schedule appointment.

## 2016-12-11 NOTE — Telephone Encounter (Signed)
Call in prescription for Megace 40 mg twice a day for 2 weeks. I will need to see her back in the office in a week for an endometrial sampling

## 2016-12-13 ENCOUNTER — Ambulatory Visit: Payer: 59 | Admitting: Podiatry

## 2016-12-16 ENCOUNTER — Ambulatory Visit (INDEPENDENT_AMBULATORY_CARE_PROVIDER_SITE_OTHER): Payer: 59 | Admitting: Gynecology

## 2016-12-16 ENCOUNTER — Encounter: Payer: Self-pay | Admitting: Gynecology

## 2016-12-16 VITALS — BP 134/80

## 2016-12-16 DIAGNOSIS — R55 Syncope and collapse: Secondary | ICD-10-CM | POA: Diagnosis not present

## 2016-12-16 DIAGNOSIS — N950 Postmenopausal bleeding: Secondary | ICD-10-CM

## 2016-12-16 DIAGNOSIS — N841 Polyp of cervix uteri: Secondary | ICD-10-CM | POA: Diagnosis not present

## 2016-12-16 DIAGNOSIS — R102 Pelvic and perineal pain: Secondary | ICD-10-CM | POA: Diagnosis not present

## 2016-12-16 DIAGNOSIS — Z124 Encounter for screening for malignant neoplasm of cervix: Secondary | ICD-10-CM | POA: Diagnosis not present

## 2016-12-16 MED ORDER — ONDANSETRON HCL 4 MG/2ML IJ SOLN: 4.0000 mg | Freq: Once | INTRAMUSCULAR | Status: DC

## 2016-12-16 MED ORDER — MEGESTROL ACETATE 40 MG PO TABS: ORAL_TABLET | ORAL | 0 refills | Status: DC

## 2016-12-16 MED ORDER — KETOROLAC TROMETHAMINE 30 MG/ML IJ SOLN
30.0000 mg | Freq: Once | INTRAMUSCULAR | Status: AC
  Administered 2016-12-16: 30 mg via INTRAVENOUS

## 2016-12-16 MED ORDER — ONDANSETRON HCL 4 MG PO TABS: 4.0000 mg | ORAL_TABLET | Freq: Three times a day (TID) | ORAL | 0 refills | Status: DC | PRN

## 2016-12-16 NOTE — Addendum Note (Signed)
Addended by: Burnett Kanaris on: 12/16/2016 04:17 PM   Modules accepted: Orders

## 2016-12-16 NOTE — Progress Notes (Addendum)
   Patient is a 51 year old that presented to the office today as part of evaluation for postmenopausal bleeding. Review of her records indicated she was having menstrual cycles until last year and had heavy bleeding was evaluated for possible endometrial ablation. She had an endometrial biopsy which was benign and also sonohysterogram demonstrated no intracavitary defect. She had mentioned that at her PCP office they had removed a cervical polyp at that time which was benign. She had called the office early this week because of the bleeding she was prescribed Megace 40 mg twice a day for 7-10 days she is on her fifth day today and reports no further bleeding. She has never been on hormone replacement therapy.  Exam: Abdomen: Soft nontender no rebound or guarding Pelvic: Bartholin urethra Skene was within normal limits Vagina: No lesions or discharge Cervix: Small cervical polyp was noted Uterus: Anteverted normal size shape and consistency Adnexa: No palpable mass or tenderness Rectal exam: Not done  She was counseled for a endometrial biopsy to compare with January. Also she was due for a Pap smear her last one was 2 years ago. A Pap smear was done first she was noted to have a cervical polyp and after cleaning the cervix with Betadine solution and with the use of a [place] clamp the cervical polyp was twisted office base and submitted for histological evaluation. An endometrial biopsy was done with a sterile [place] the uterus sounded to 7-1/2 cm and tissue was obtained for histological evaluation.  Immediately after completing the biopsy patient had a vasovagal pedicle reaction and she was administered O2 via mask. Her blood pressure was 122/80 her pulse was 76. For the nausea she was given Zofran 4 mg sublingual and further cramping she was given Toradol 30 mg IM. She was also given juice and some crackers that she has not had lunch and her daughter was notified to come pick her up to she would  not have to drive home.  Assessment/plan: 51 year old status post endometrial ablation via cryo-technique January this year with bleeding once again with patches of clots early this week. Pap smear endometrial biopsy and removal of cervical polyp done today pathology report pending at time of this dictation. She was instructed to continue the Megace 40 mg twice a day for 2 more weeks. We'll wait for the result of the biopsy and manage accordingly.

## 2016-12-16 NOTE — Addendum Note (Signed)
Addended by: Burnett Kanaris on: 12/16/2016 04:33 PM   Modules accepted: Orders

## 2016-12-16 NOTE — Patient Instructions (Signed)

## 2016-12-18 LAB — PAP IG W/ RFLX HPV ASCU

## 2016-12-20 ENCOUNTER — Telehealth: Payer: Self-pay | Admitting: Endocrinology

## 2016-12-20 NOTE — Telephone Encounter (Signed)
Called patient and she stated that she has gained about 10 lbs in about a week and a half to two weeks. She is very concerned that something is not working correctly. She has questions about the Saxenda or Victoza, would this be better? She is really concerned about her weight gain. Okay to call back on Monday. Please advise.

## 2016-12-20 NOTE — Telephone Encounter (Signed)
These are more likely to cause weight loss.  However, if you want to, we can d/c these and increase the insulin.  OK with you?

## 2016-12-20 NOTE — Telephone Encounter (Signed)
These meds are similar to trulicity.  thte best thing you can do for your weight is to double the trulicity, which I would be happy to do.

## 2016-12-20 NOTE — Telephone Encounter (Signed)
canagliflozin (INVOKANA) 100 MG TABS tablet    Dulaglutide (TRULICITY) 3.70 DU/4.3CV SOPN    All medications are causing her to gain weight, call pt to advise please leave a voicemail if no answer.

## 2016-12-23 MED ORDER — GLUCOSE BLOOD VI STRP: 1.0000 | ORAL_STRIP | Freq: Four times a day (QID) | 12 refills | Status: DC

## 2016-12-23 MED ORDER — DULAGLUTIDE 1.5 MG/0.5ML ~~LOC~~ SOAJ: SUBCUTANEOUS | 2 refills | Status: DC

## 2016-12-23 NOTE — Telephone Encounter (Signed)
Ok, I have sent a prescription to your pharmacy, to increase to 4/day

## 2016-12-23 NOTE — Telephone Encounter (Signed)
Pateint notified

## 2016-12-23 NOTE — Telephone Encounter (Signed)
Patient notified of instructions and voiced understanding. Trulicity 1.5 mg submitted to the pharmacy. Patient did want to ask if we could increase the testing frequency from 2 times daily to 4 times daily?

## 2016-12-27 ENCOUNTER — Other Ambulatory Visit: Payer: Self-pay

## 2016-12-27 MED ORDER — GLUCOSE BLOOD VI STRP: 1.0000 | ORAL_STRIP | Freq: Four times a day (QID) | 12 refills | Status: DC

## 2017-01-07 ENCOUNTER — Telehealth: Payer: Self-pay | Admitting: Endocrinology

## 2017-01-07 NOTE — Telephone Encounter (Signed)
No problem, just start taking on tuesdays

## 2017-01-07 NOTE — Telephone Encounter (Signed)
Patient is supposed to take her Dulaglutide (TRULICITY) 1.5 OF/1.8AQ SOPN every Friday however, she took it last night without thinking. Should she be concerned? Should she take on this Friday? Call patient to advise. Okay to leave a message.

## 2017-01-08 NOTE — Telephone Encounter (Signed)
I contacted the patient and advised of message. She voiced understanding and had no further questions at this time.  

## 2017-01-13 ENCOUNTER — Encounter: Payer: Self-pay | Admitting: Endocrinology

## 2017-01-13 ENCOUNTER — Other Ambulatory Visit: Payer: Self-pay

## 2017-01-13 ENCOUNTER — Ambulatory Visit (INDEPENDENT_AMBULATORY_CARE_PROVIDER_SITE_OTHER): Payer: 59 | Admitting: Endocrinology

## 2017-01-13 VITALS — BP 140/96 | HR 87 | Wt 199.2 lb

## 2017-01-13 DIAGNOSIS — Z794 Long term (current) use of insulin: Secondary | ICD-10-CM

## 2017-01-13 DIAGNOSIS — E890 Postprocedural hypothyroidism: Secondary | ICD-10-CM | POA: Diagnosis not present

## 2017-01-13 DIAGNOSIS — E119 Type 2 diabetes mellitus without complications: Secondary | ICD-10-CM

## 2017-01-13 LAB — BASIC METABOLIC PANEL
BUN: 16 mg/dL (ref 6–23)
CO2: 24 mEq/L (ref 19–32)
Calcium: 9.7 mg/dL (ref 8.4–10.5)
Chloride: 105 mEq/L (ref 96–112)
Creatinine, Ser: 1.11 mg/dL (ref 0.40–1.20)
GFR: 66.59 mL/min (ref >60.00)
Glucose, Bld: 172 mg/dL — ABNORMAL HIGH (ref 70–99)
Potassium: 3.7 mEq/L (ref 3.5–5.1)
Sodium: 138 mEq/L (ref 135–145)

## 2017-01-13 LAB — HEMOGLOBIN A1C: Hgb A1c MFr Bld: 8 % — ABNORMAL HIGH (ref 4.6–6.5)

## 2017-01-13 LAB — TSH: TSH: 46.13 u[IU]/mL — ABNORMAL HIGH (ref 0.35–4.50)

## 2017-01-13 MED ORDER — CANAGLIFLOZIN 300 MG PO TABS: 300.0000 mg | ORAL_TABLET | Freq: Every day | ORAL | 4 refills | Status: DC

## 2017-01-13 MED ORDER — LIRAGLUTIDE 18 MG/3ML ~~LOC~~ SOPN: 1.8000 mg | PEN_INJECTOR | Freq: Every day | SUBCUTANEOUS | 3 refills | Status: DC

## 2017-01-13 NOTE — Patient Instructions (Addendum)
check your blood sugar once a day.  vary the time of day when you check, between before the 3 meals, and at bedtime.  also check if you have symptoms of your blood sugar being too high or too low.  please keep a record of the readings and bring it to your next appointment here (or you can bring the meter itself).  You can write it on any piece of paper.  please call us sooner if your blood sugar goes below 70, or if you have a lot of readings over 200.   I have sent a prescription to your pharmacy, to add the "victoza" pen, once a day.  The side-effect is nausea, which goes away with time.  To avoid this side-effect, start with the lowest (0.6) setting.  After a few days, increase to 1.2.  If you still have little or no nausea, increase to the highest (1.8) setting, and continue that setting.  Here is a discount card.  This medication is in addition to the trulicity.   blood tests are requested for you today.  We'll let you know about the results.  Based on the results, you may be able to stop the insulin.  Please come back for a follow-up appointment in 3 months.   Please have your blood pressure rechecked soon, with your PCP.

## 2017-01-13 NOTE — Progress Notes (Signed)
Called patient and notified her of medication changes & to make sure she isn't missing any medication dosages.

## 2017-01-13 NOTE — Progress Notes (Signed)
Subjective:    Patient ID: Kendra [place], female    DOB: 06-02-1966, 51 y.o.   MRN: [id number]  HPI Pt returns for f/u of diabetes mellitus: DM type: Insulin-requiring type 2 Dx'ed: 6629 Complications: none Therapy: insulin since 4765, trulicity, and invokana GDM: 1984 DKA: never.   Severe hypoglycemia: once, in 2018 Pancreatitis: never.  Other: she takes multiple daily injections.  Interval history:  She says she never misses the insulin.  She takes trulicity, invokana, and novolog, 3 units 3 times a day (just before each meal).  no cbg record, but states cbg's vary from 95-195.  There is no trend throughout the day.  She says the trulicity is not helping her lose enough weight.   Past Medical History:  Diagnosis Date  . Allergy   . Anxiety   . Asthma   . Depression   . Diabetes mellitus   . Fibromyalgia   . Gastric ulcer   . GERD (gastroesophageal reflux disease)   . Grave's disease   . Graves disease   . H/O therapeutic radiation   . Migraine   . Multiple thyroid nodules   . Myofacial muscle pain   . Osteoarthritis    Facet hypertrophy with injections  . TIA (transient ischemic attack)    02/2015    Past Surgical History:  Procedure Laterality Date  . AXILLARY LYMPH NODE DISSECTION  2001  . HYDRADENITIS EXCISION    . TUBAL LIGATION  1995    Social History   Social History  . Marital status: Single    Spouse name: N/A  . Number of children: 4  . Years of education: 14   Occupational History  . 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 Smoking status: Former Smoker    Packs/day: 0.50    Types: Cigarettes  . Smokeless tobacco: Never Used  . Alcohol use No     Comment: rare  . Drug use: No     Comment: cocaine use daily for 3 yrs in her 32's. 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  Today denies all drug abuse  . Sexual activity: Not Currently    Birth control/ protection: Surgical   Other Topics Concern  . Not  on file   Social History Narrative   Lives alone in [place]. Her daugther occassoinally comes and stay with her. 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  originally from [place],  [place].  Raised by mom and her sister who is 8 yrs older than pt. Pt has several half siblings. No longer on disability. Pt has 4 kids, never married.              Current Outpatient Prescriptions on File Prior to Visit  Medication Sig Dispense Refill  . albuterol (PROAIR HFA) 108 (90 Base) MCG/ACT inhaler Inhale 2 puffs into the lungs every 4 (four) hours as needed for wheezing. 1 Inhaler 5  . Dulaglutide (TRULICITY) 1.5 YY/5.0PT SOPN Inject 1.5 mg weekly. 4 pen 2  . escitalopram (LEXAPRO) 20 MG tablet TAKE 1 TABLET(20 MG) BY MOUTH DAILY 90 tablet 3  . glucose blood (ONETOUCH VERIO) test strip 1 each by Other route 4 (four) times daily. And lancets 4/day 450 each 12  . insulin lispro (HUMALOG KWIKPEN) 100 UNIT/ML KiwkPen Inject 0.13 mLs (13 Units total) into the skin 3 (three) times daily with meals. And pen needles  3/day 15 mL 11  . linaclotide (LINZESS) 145 MCG CAPS capsule Take 145 mcg by mouth daily before breakfast.    . naproxen (NAPROSYN) 500 MG tablet TAKE 1 TABLET(500 MG) BY MOUTH EVERY 12 HOURS AS NEEDED 180 tablet 2  . nortriptyline (PAMELOR) 25 MG capsule TAKE 3 CAPSULES(75 MG) BY MOUTH AT BEDTIME (Patient taking differently: TAKE 3 CAPSULES(75 MG) BY MOUTH AT BEDTIME AS NEEDED FOR SLEEP) 270 capsule 3  . ondansetron (ZOFRAN) 4 MG tablet Take 1 tablet (4 mg total) by mouth every 8 (eight) hours as needed for nausea or vomiting. 20 tablet 0  . Polyethyl Glycol-Propyl Glycol (SYSTANE OP) Apply 1-2 drops to eye daily as needed (dryness).    . promethazine (PHENERGAN) 25 MG tablet TAKE 1 TABLET(25 MG) BY MOUTH EVERY 8 HOURS AS NEEDED FOR NAUSEA OR VOMITING 30 tablet 0  . rizatriptan (MAXALT-MLT) 10 MG disintegrating tablet Take 1  tablet earliest onset of headache.  May repeat once in 2 hours if needed 9 tablet 11  . levothyroxine (SYNTHROID, LEVOTHROID) 125 MCG tablet TAKE 1 TABLET BY MOUTH DAILY 60 tablet 2   No current facility-administered medications on file prior to visit.     Allergies  Allergen Reactions  . Ergotrate [Ergonovine] Other (See Comments)    Pt does not ever want  . Ibuprofen Other (See Comments)    Patient has ulcer  . Metformin And Related Other (See Comments)    Dizziness  . Yutopar [Ritodrine] Other (See Comments)    Pt does not ever want    Family History  Problem Relation Age of Onset  . Hypertension Mother   . Diabetes Mother   . Cancer Mother        rectal  . Heart disease Father 59       MI x5  . Hypertension Father   . Diabetes Father   . Cancer Sister 41       breast  . Drug abuse Brother   . Alcohol abuse Brother   . Drug abuse Brother   . Alcohol abuse Brother   . Anxiety disorder Neg Hx   . Bipolar disorder Neg Hx   . Depression Neg Hx     BP (!) 140/96 (BP Location: Right Arm, Cuff Size: Normal)   Pulse 87   Wt 199 lb 3.2 oz (90.4 kg)   SpO2 95%   BMI 34.73 kg/m    Review of Systems She denies hypoglycemia.      Objective:   Physical Exam VITAL SIGNS:  See vs page GENERAL: no distress Pulses: foot pulses are intact bilaterally.   MSK: no deformity of the feet or ankles.  CV: no edema of the legs or ankles Skin:  no ulcer on the feet or ankles.  normal color and temp on the feet and ankles Neuro: sensation is intact to touch on the feet and ankles.    Lab Results  Component Value Date   HGBA1C 8.0 (H) 01/13/2017   Lab Results  Component Value Date   TSH 46.13 (H) 01/13/2017      Assessment & Plan:  Insulin-requiring type 2 DM:  She needs increased rx Hypothyroidism: worse, prob due to noncompliance.  I advised pt to take QD HTN: persistent.   Patient Instructions  check your blood sugar once a day.  vary the time of day when you check,  between before the 3 meals, and at bedtime.  also check if you have symptoms of your blood sugar being  too high or too low.  please keep a record of the readings and bring it to your next appointment here (or you can bring the meter itself).  You can write it on any piece of paper.  please call us sooner if your blood sugar goes below 70, or if you have a lot of readings over 200.   I have sent a prescription to your pharmacy, to add the "victoza" pen, once a day.  The side-effect is nausea, which goes away with time.  To avoid this side-effect, start with the lowest (0.6) setting.  After a few days, increase to 1.2.  If you still have little or no nausea, increase to the highest (1.8) setting, and continue that setting.  Here is a discount card.  This medication is in addition to the trulicity.   blood tests are requested for you today.  We'll let you know about the results.  Based on the results, you may be able to stop the insulin.  Please come back for a follow-up appointment in 3 months.   Please have your blood pressure rechecked soon, with your PCP.

## 2017-01-14 ENCOUNTER — Telehealth: Payer: Self-pay

## 2017-01-14 ENCOUNTER — Telehealth: Payer: Self-pay | Admitting: Endocrinology

## 2017-01-14 NOTE — Telephone Encounter (Signed)
Patient called to report that she has been bleeding since her last visit with you on 12/16/16. What to rec?

## 2017-01-14 NOTE — Telephone Encounter (Signed)
Patient needs refill on trulicity. She is taking her last dose today. States that pharmacist informed her that victoza could be approved with a different dosage amount. She is unsure of the dosage. She thinks its for 2 pens a month. Pt uses [facility] on [address].

## 2017-01-14 NOTE — Telephone Encounter (Signed)
Tell her CBC and endometrial biopsy was normal. Recommend Megace 40 mg BID for 2 more weeks if continues bleeding after that she will need to consider proceeding with hysterectomy. I would make an appointment for her to see Dr. Lonia Skinner who would schedule it.

## 2017-01-14 NOTE — Telephone Encounter (Signed)
When I called patient back with what you recommended she said she has not really been bleeding. It has been dark like old blood and she sees it daily when she wipes.  I told her I would let you know and see if your recommendation changed.

## 2017-01-15 MED ORDER — LIRAGLUTIDE 18 MG/3ML ~~LOC~~ SOPN: 1.2000 mg | PEN_INJECTOR | Freq: Every day | SUBCUTANEOUS | 3 refills | Status: DC

## 2017-01-15 MED ORDER — DULAGLUTIDE 1.5 MG/0.5ML ~~LOC~~ SOAJ: SUBCUTANEOUS | 11 refills | Status: DC

## 2017-01-15 NOTE — Telephone Encounter (Signed)
Okay we will watch for now

## 2017-01-15 NOTE — Telephone Encounter (Signed)
I contacted the patient and discussed MD's message. She voiced understanding. She stated she was going to call the insurance and will call back and let us know the information she receives.

## 2017-01-15 NOTE — Telephone Encounter (Signed)
Patient called to advise that without any additional dosages it will be 2 pens, 30 days and 0.2 dosage daily. With additional dosage a PA is needed and the number to call is [phone] and ask for someone who has access to employee accounts. Call patient to advise, okay to leave a detailed message on phone.

## 2017-01-15 NOTE — Telephone Encounter (Signed)
I have sent a prescription to your pharmacy, to refill the trulicity.  Ins declined the addition of victoza.

## 2017-01-15 NOTE — Telephone Encounter (Signed)
Patient informed. 

## 2017-01-15 NOTE — Telephone Encounter (Signed)
Ok, I have sent a prescription to your pharmacy 

## 2017-01-16 ENCOUNTER — Ambulatory Visit (INDEPENDENT_AMBULATORY_CARE_PROVIDER_SITE_OTHER): Payer: 59 | Admitting: Neurology

## 2017-01-16 ENCOUNTER — Encounter: Payer: Self-pay | Admitting: Neurology

## 2017-01-16 ENCOUNTER — Ambulatory Visit: Payer: 59 | Admitting: Neurology

## 2017-01-16 VITALS — BP 120/70 | HR 96 | Ht 63.75 in | Wt 200.4 lb

## 2017-01-16 DIAGNOSIS — R55 Syncope and collapse: Secondary | ICD-10-CM

## 2017-01-16 MED ORDER — TOPIRAMATE ER 100 MG PO CAP24: 100.0000 mg | ORAL_CAPSULE | Freq: Every day | ORAL | 3 refills | Status: DC

## 2017-01-16 MED ORDER — TOPIRAMATE ER 50 MG PO CAP24: 1.0000 | ORAL_CAPSULE | Freq: Every day | ORAL | 0 refills | Status: DC

## 2017-01-16 MED ORDER — ONDANSETRON HCL 4 MG PO TABS: 4.0000 mg | ORAL_TABLET | Freq: Three times a day (TID) | ORAL | 2 refills | Status: DC | PRN

## 2017-01-16 MED ORDER — TOPIRAMATE ER 50 MG PO CAP24: ORAL_CAPSULE | ORAL | 0 refills | Status: DC

## 2017-01-16 NOTE — Patient Instructions (Addendum)
1.  Take the Trokendi XR 50mg  at bedtime sample for 7 days and then start the 100mg  capsule at bedtime.  I will place a prescription for the 100mg  capsule and work on the pre-auth. 2.  Use the Maxalt as needed. 3.  Instead of promethazine, use Zofran for nausea. 4.  Follow up in 3 months.

## 2017-01-16 NOTE — Progress Notes (Signed)
NEUROLOGY FOLLOW UP OFFICE NOTE  Kendra Hall [id number]  HISTORY OF PRESENT ILLNESS: Kendra Hall is a 51 year old female with type 2 diabetes mellitus, fibromyalgia, depression and hypothyroidism who follows up for migraine.  UPDATE: Due to memory problems and word-finding difficulty, her topiramate was changed to extended release topiramate as it less likely causes this side effect.  However, the pre-authorization never went through and she ran out of immediate release topiramate as well.  Therefore, headaches have picked up in frequency.  Intensity:  10/10 Duration:  2 hours with Maxalt Frequency:  Almost daily Frequency of abortive medication: Tylenol daily Current NSAIDS:  no Current analgesics:  Tylenol 500mg  at bedtime Current triptans:  Maxalt-MLT 10mg  Current anti-emetic:  promethazine 25mg  (makes her sleepy) Current muscle relaxants:  no Current anti-anxiolytic:  no Current sleep aide:  nortriptyline 75mg  as needed Current Antihypertensive medications:  no Current Antidepressant medications:  nortriptyline 75mg  as needed for sleep (cannot take nightly due to extreme dry mouth), Lexapro 20mg  Current Anticonvulsant medications:  no Current Vitamins/Herbal/Supplements:  no Current Antihistamines/Decongestants:  no Other therapy:  no   Caffeine:  no Alcohol:  no Smoker:  yes Diet:  hydrates Exercise:  Not routine Depression/anxiety:  stable Sleep hygiene:  good  HISTORY:  Onset:  She has history of migraines since her 67s, but they have been every other day for the past 2 weeks.  Last migraine before then was 6 months prior. Location:  Varies: back of head, across forehead, temples Quality:  Varies: squeezing, sharp Initial Intensity:  10/10 Aura:  no Prodrome:  no Postdrome:  no Associated symptoms:  Nausea, photophobia, phonophobia.  She has not had any new worse headache of her life, waking up from sleep Initial Duration:  All day (usually lets up in 30  minutes after taking Maxalt, however lately headache returns in 2 hours) Initial Frequency:  Every other day Initial Frequency of abortive medication: every other day Triggers/exacerbating factors:  unknown Relieving factors:  sleep Activity:  aggravates   Past NSAIDS:  ibuprofen Past analgesics:  Tylenol, Excedrin Migraine Past abortive triptans:  Sumatriptan tablet Past muscle relaxants:  no Past anti-emetic:  no Past antihypertensive medications:  no Past antidepressant medications:  sertraline 50mg  Past anticonvulsant medications:  topiramate IM 100mg  (cognitive problems) Past vitamins/Herbal/Supplements:  no Other past therapies:  no   Family history of headache:  cousins   MRI and MRA of head from 03/15/15 were personally reviewed and were unremarkable except for minimal punctate white matter foci.  PAST MEDICAL HISTORY: Past Medical History:  Diagnosis Date  . Allergy   . Anxiety   . Asthma   . Depression   . Diabetes mellitus   . Fibromyalgia   . Gastric ulcer   . GERD (gastroesophageal reflux disease)   . Grave's disease   . Graves disease   . H/O therapeutic radiation   . Migraine   . Multiple thyroid nodules   . Myofacial muscle pain   . Osteoarthritis    Facet hypertrophy with injections  . TIA (transient ischemic attack)    02/2015    MEDICATIONS: Current Outpatient Prescriptions on File Prior to Visit  Medication Sig Dispense Refill  . albuterol (PROAIR HFA) 108 (90 Base) MCG/ACT inhaler Inhale 2 puffs into the lungs every 4 (four) hours as needed for wheezing. 1 Inhaler 5  . canagliflozin (INVOKANA) 300 MG TABS tablet Take 1 tablet (300 mg total) by mouth daily before breakfast. 30 tablet 4  .  Dulaglutide (TRULICITY) 1.5 BT/5.1VO SOPN Inject 1.5 mg weekly. 4 pen 11  . escitalopram (LEXAPRO) 20 MG tablet TAKE 1 TABLET(20 MG) BY MOUTH DAILY 90 tablet 3  . glucose blood (ONETOUCH VERIO) test strip 1 each by Other route 4 (four) times daily. And lancets  4/day 450 each 12  . insulin lispro (HUMALOG KWIKPEN) 100 UNIT/ML KiwkPen Inject 0.13 mLs (13 Units total) into the skin 3 (three) times daily with meals. And pen needles 3/day 15 mL 11  . levothyroxine (SYNTHROID, LEVOTHROID) 125 MCG tablet TAKE 1 TABLET BY MOUTH DAILY 60 tablet 2  . linaclotide (LINZESS) 145 MCG CAPS capsule Take 145 mcg by mouth daily before breakfast.    . liraglutide (VICTOZA) 18 MG/3ML SOPN Inject 0.2 mLs (1.2 mg total) into the skin daily. 9 mL 3  . naproxen (NAPROSYN) 500 MG tablet TAKE 1 TABLET(500 MG) BY MOUTH EVERY 12 HOURS AS NEEDED 180 tablet 2  . nortriptyline (PAMELOR) 25 MG capsule TAKE 3 CAPSULES(75 MG) BY MOUTH AT BEDTIME (Patient taking differently: TAKE 3 CAPSULES(75 MG) BY MOUTH AT BEDTIME AS NEEDED FOR SLEEP) 270 capsule 3  . Polyethyl Glycol-Propyl Glycol (SYSTANE OP) Apply 1-2 drops to eye daily as needed (dryness).    . rizatriptan (MAXALT-MLT) 10 MG disintegrating tablet Take 1 tablet earliest onset of headache.  May repeat once in 2 hours if needed 9 tablet 11   No current facility-administered medications on file prior to visit.     ALLERGIES: Allergies  Allergen Reactions  . Ergotrate [Ergonovine] Other (See Comments)    Pt does not ever want  . Ibuprofen Other (See Comments)    Patient has ulcer  . Metformin And Related Other (See Comments)    Dizziness  . Yutopar [Ritodrine] Other (See Comments)    Pt does not ever want    FAMILY HISTORY: Family History  Problem Relation Age of Onset  . Hypertension Mother   . Diabetes Mother   . Cancer Mother        rectal  . Heart disease Father 53       MI x5  . Hypertension Father   . Diabetes Father   . Cancer Sister 41       breast  . Drug abuse Brother   . Alcohol abuse Brother   . Drug abuse Brother   . Alcohol abuse Brother   . Anxiety disorder Neg Hx   . Bipolar disorder Neg Hx   . Depression Neg Hx     SOCIAL HISTORY: Social History   Social History  . Marital status:  Single    Spouse name: N/A  . Number of children: 4  . Years of education: 14   Occupational History  . customer service [place] History Main Topics  . Smoking status: Former Smoker    Packs/day: 0.50    Types: Cigarettes  . Smokeless tobacco: Never Used  . Alcohol use No     Comment: rare  . 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  Today denies all drug abuse  . Sexual activity: Not Currently    Birth control/ protection: Surgical   Other Topics Concern  . Not on file   Social History Narrative   Lives alone in [place]. Her daugther occassoinally comes and stay with her. Pt is working Therapist, art at [employer]. Enjoys reading  and pt has an associates in applied sciences.  originally from [place],  [place].  Raised by mom and her sister who is 8 yrs older than pt. Pt has several half siblings. No longer on disability. Pt has 4 kids, never married.              REVIEW OF SYSTEMS: Constitutional: No fevers, chills, or sweats, no generalized fatigue, change in appetite Eyes: No visual changes, double vision, eye pain Ear, nose and throat: No hearing loss, ear pain, nasal congestion, sore throat Cardiovascular: No chest pain, palpitations Respiratory:  No shortness of breath at rest or with exertion, wheezes GastrointestinaI: No nausea, vomiting, diarrhea, abdominal pain, fecal incontinence Genitourinary:  No dysuria, urinary retention or frequency Musculoskeletal:  No neck pain, back pain Integumentary: No rash, pruritus, skin lesions Neurological: as above Psychiatric: No depression, insomnia, anxiety Endocrine: No palpitations, fatigue, diaphoresis, mood swings, change in appetite, change in weight, increased thirst Hematologic/Lymphatic:  No purpura, petechiae. Allergic/Immunologic: no itchy/runny eyes, nasal congestion, recent allergic reactions,  rashes  PHYSICAL EXAM: Vitals:   01/16/17 0736  BP: 120/70  Pulse: 96   General: No acute distress.  Patient appears well-groomed.   Head:  Normocephalic/atraumatic Eyes:  Fundi examined but not visualized Neck: supple, no paraspinal tenderness, full range of motion Heart:  Regular rate and rhythm Lungs:  Clear to auscultation bilaterally Back: No paraspinal tenderness Neurological Exam: alert and oriented to person, place, and time. Attention span and concentration intact, recent and remote memory intact, fund of knowledge intact.  Speech fluent and not dysarthric, language intact.  CN II-XII intact. Bulk and tone normal, muscle strength 5/5 throughout.  Sensation to light touch, temperature and vibration intact.  Deep tendon reflexes 2+ throughout, toes downgoing.  Finger to nose and heel to shin testing intact.  Gait normal, Romberg negative.  IMPRESSION: Migraine without aura  PLAN: 1.  We will start topiramate ER 50mg  daily at bedtime for 1 week and then increase to 100mg  at bedtime.  She is a candidate for ER topiramate because IR topiramate is effective but causes cognitive problems that less likely occurs with ER. 2.  Maxalt-MLT for abortive therapy 3.  Stop Tylenol 4.  Instead of promethazine, will prescribe Zofran 4mg  for nausea, which less likely causes drowsiness. 5.  Follow up in 3 months.  Metta Clines, DO  CC:  Phill Myron, DO

## 2017-01-17 ENCOUNTER — Telehealth: Payer: Self-pay | Admitting: Neurology

## 2017-01-17 ENCOUNTER — Telehealth: Payer: Self-pay | Admitting: Endocrinology

## 2017-01-17 NOTE — Telephone Encounter (Signed)
Trokendi XR not covered. Please advise.

## 2017-01-17 NOTE — Telephone Encounter (Signed)
Patient left message on voice mail about medication she did not leave the name of the medication. She states that it is not covered under her insurance

## 2017-01-17 NOTE — Telephone Encounter (Signed)
please call patient: Ins says you have to take the victoza before they will approve the trulicity.

## 2017-01-19 NOTE — Telephone Encounter (Signed)
I believe that I spoke with Caryl Pina about contacting the rep

## 2017-01-20 ENCOUNTER — Other Ambulatory Visit: Payer: Self-pay | Admitting: *Deleted

## 2017-01-20 ENCOUNTER — Telehealth: Payer: Self-pay | Admitting: Neurology

## 2017-01-20 MED ORDER — ATENOLOL 50 MG PO TABS: 50.0000 mg | ORAL_TABLET | Freq: Every day | ORAL | 3 refills | Status: DC

## 2017-01-20 NOTE — Telephone Encounter (Signed)
Patient advised she was able to pick up the prescription of trulicity last week.

## 2017-01-20 NOTE — Telephone Encounter (Signed)
I spoke with patient and she would like to try the atenolol.  Rx sent in.

## 2017-01-20 NOTE — Telephone Encounter (Signed)
Caryl Pina please advise.

## 2017-01-20 NOTE — Telephone Encounter (Signed)
Kendra Hall, if we can't get Trokendi approved, then I can start her on atenolol 50mg  daily.  She can then contact us in 4 weeks with update and we can increase to 100mg  daily if needed.

## 2017-01-20 NOTE — Telephone Encounter (Signed)
PT called and left a message that her topamax is not covered by her insurance

## 2017-01-22 ENCOUNTER — Ambulatory Visit (INDEPENDENT_AMBULATORY_CARE_PROVIDER_SITE_OTHER): Payer: 59 | Admitting: Gynecology

## 2017-01-22 ENCOUNTER — Other Ambulatory Visit: Payer: Self-pay | Admitting: Gynecology

## 2017-01-22 ENCOUNTER — Encounter: Payer: Self-pay | Admitting: Gynecology

## 2017-01-22 VITALS — BP 118/78 | Wt 196.0 lb

## 2017-01-22 DIAGNOSIS — N950 Postmenopausal bleeding: Secondary | ICD-10-CM

## 2017-01-22 DIAGNOSIS — R7309 Other abnormal glucose: Secondary | ICD-10-CM

## 2017-01-22 LAB — CBC WITH DIFFERENTIAL/PLATELET
Basophils Absolute: 0 cells/uL (ref 0–200)
Basophils Relative: 0 %
Eosinophils Absolute: 148 cells/uL (ref 15–500)
Eosinophils Relative: 2 %
HCT: 47.3 % — ABNORMAL HIGH (ref 35.0–45.0)
Hemoglobin: 15.9 g/dL — ABNORMAL HIGH (ref 11.7–15.5)
Lymphocytes Relative: 30 %
Lymphs Abs: 2220 cells/uL (ref 850–3900)
MCH: 27.9 pg (ref 27.0–33.0)
MCHC: 33.6 g/dL (ref 32.0–36.0)
MCV: 83.1 fL (ref 80.0–100.0)
MPV: 9.6 fL (ref 7.5–12.5)
Monocytes Absolute: 666 cells/uL (ref 200–950)
Monocytes Relative: 9 %
Neutro Abs: 4366 cells/uL (ref 1500–7800)
Neutrophils Relative %: 59 %
Platelets: 379 10*3/uL (ref 140–400)
RBC: 5.69 MIL/uL — ABNORMAL HIGH (ref 3.80–5.10)
RDW: 16 % — ABNORMAL HIGH (ref 11.0–15.0)
WBC: 7.4 10*3/uL (ref 3.8–10.8)

## 2017-01-22 MED ORDER — MEGESTROL ACETATE 40 MG PO TABS: 40.0000 mg | ORAL_TABLET | Freq: Two times a day (BID) | ORAL | 1 refills | Status: DC

## 2017-01-22 NOTE — Progress Notes (Signed)
Patient ID: Kendra Hall, female   DOB: 06-29-1965, 51 y.o.   MRN: [id number]     Patient is a 51 year old that presented to the office today with continuation of her perimenopausal dysfunctional uterine bleeding. Patient in February of this year had a cryoablation here in the office as a result of her heavy bleeding. She had an extensive evaluation to include a benign endometrial biopsy at that time and an ultrasound/sono hysterogram which had demonstrated the following: Uterus 9.1 x 6.1 x 4.9 cm with an endometrial stripe of 7 mm. 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 Right thick walled follicle 8 mm was noted left ovary was normal. No fluid in the cul-de-sac her sonohysterogram did not demonstrate any intracavitary defect. All this was done before her ablation.  In June of this year because of her perimenopausal persistence is irregular bleeding she had a repeat endometrial biopsy and also had a cervical polyp removed from her external cervical os and both pathology report were normal. Patient returns today she continues to have heavy bleeding she'll pictures of clots that she passed and wanted discuss alternative treatment options.  Exam: Abdomen: Soft nontender no rebound or guarding Pelvic: Bartholin urethra Skene was within normal limits Vagina some blood was present the vaginal vault Cervix: No polyps were seen no active bleeding Uterus: Anteverted normal size shape and consistency Adnexa: No palpable masses or tenderness Rectal exam not done  Assessment/plan: 51 year old gravida 4 para 4 (vaginal deliveries) with persistent perimenopausal dysfunctional uterine bleeding recent endometrial biopsy June of this year benign sonohysterogram January of this year no intracavitary defect. Patient will return back to the office for a sonohysterogram in the next 7-10 days to see if her cavity will allow a repeat endometrial ablation but this time I would recommended be done in the hospital under  anesthesia and a different technique such as a NovaSure if the sonohysterogram demonstrate that the cavity allows for the procedure to be done. If endometrial polyps or submucous myoma is noted she can have that resected and the ablation at the same time. I also offered her a transvaginal hysterectomy with bilateral salpingo-oophorectomy for which she is not interested. We had also offered her Mirena IUD which she was not interested as well. In the meantime she'll be prescribed Megace 40 mg twice a day for the next 10 days and she'll be seen by that time for the sonohysterogram and decide which of the options presented above she may decide to proceed. I provided her with literature information on all the above.

## 2017-01-22 NOTE — Patient Instructions (Signed)
Endometrial Ablation Endometrial ablation is a procedure that destroys the thin inner layer of the lining of the uterus (endometrium). This procedure may be done:  To stop heavy periods.  To stop bleeding that is causing anemia.  To control irregular bleeding.  To treat bleeding caused by small tumors (fibroids) in the endometrium.  This procedure is often an alternative to major surgery, such as removal of the uterus and cervix (hysterectomy). As a result of this procedure:  You may not be able to have children. However, if you are premenopausal (you have not gone through menopause): ? You may still have a small chance of getting pregnant. ? You will need to use a reliable method of birth control after the procedure to prevent pregnancy.  You may stop having a menstrual period, or you may have only a small amount of bleeding during your period. Menstruation may return several years after the procedure.  Tell a health care provider about:  Any allergies you have.  All medicines you are taking, including vitamins, herbs, eye drops, creams, and over-the-counter medicines.  Any problems you or family members have had with the use of anesthetic medicines.  Any blood disorders you have.  Any surgeries you have had.  Any medical conditions you have. What are the risks? Generally, this is a safe procedure. However, problems may occur, including:  A hole (perforation) in the uterus or bowel.  Infection of the uterus, bladder, or vagina.  Bleeding.  Damage to other structures or organs.  An air bubble in the lung (air embolus).  Problems with pregnancy after the procedure.  Failure of the procedure.  Decreased ability to diagnose cancer in the endometrium.  What happens before the procedure?  You will have tests of your endometrium to make sure there are no pre-cancerous cells or cancer cells present.  You may have an ultrasound of the uterus.  You may be given  medicines to thin the endometrium.  Ask your health care provider about: ? Changing or stopping your regular medicines. This is especially important if you take diabetes medicines or blood thinners. ? Taking medicines such as aspirin and ibuprofen. These medicines can thin your blood. Do not take these medicines before your procedure if your doctor tells you not to.  Plan to have someone take you home from the hospital or clinic. What happens during the procedure?  You will lie on an exam table with your feet and legs supported as in a pelvic exam.  To lower your risk of infection: ? Your health care team will wash or sanitize their hands and put on germ-free (sterile) gloves. ? Your genital area will be washed with soap.  An IV tube will be inserted into one of your veins.  You will be given a medicine to help you relax (sedative).  A surgical instrument with a light and camera (resectoscope) will be inserted into your vagina and moved into your uterus. This allows your surgeon to see inside your uterus.  Endometrial tissue will be removed using one of the following methods: ? Radiofrequency. This method uses a radiofrequency-alternating electric current to remove the endometrium. ? Cryotherapy. This method uses extreme cold to freeze the endometrium. ? Heated-free liquid. This method uses a heated saltwater (saline) solution to remove the endometrium. ? Microwave. This method uses high-energy microwaves to heat up the endometrium and remove it. ? Thermal balloon. This method involves inserting a catheter with a balloon tip into the uterus. The balloon tip is   filled with heated fluid to remove the endometrium. The procedure may vary among health care providers and hospitals. What happens after the procedure?  Your blood pressure, heart rate, breathing rate, and blood oxygen level will be monitored until the medicines you were given have worn off.  As tissue healing occurs, you may  notice vaginal bleeding for 4-6 weeks after the procedure. You may also experience: ? Cramps. ? Thin, watery vaginal discharge that is light pink or brown in color. ? A need to urinate more frequently than usual. ? Nausea.  Do not drive for 24 hours if you were given a sedative.  Do not have sex or insert anything into your vagina until your health care provider approves. Summary  Endometrial ablation is done to treat the many causes of heavy menstrual bleeding.  The procedure may be done only after medications have been tried to control the bleeding.  Plan to have someone take you home from the hospital or clinic. This information is not intended to replace advice given to you by your health care provider. Make sure you discuss any questions you have with your health care provider. Document Released: 04/19/2004 Document Revised: 06/27/2016 Document Reviewed: 06/27/2016 Elsevier Interactive Patient Education  2017 Elsevier Inc.  

## 2017-01-23 ENCOUNTER — Telehealth: Payer: Self-pay | Admitting: Neurology

## 2017-01-23 NOTE — Telephone Encounter (Signed)
[place] with cover my meds called in regards to PT and needing a prior authorization for Trokendie (spelling?) CB# [id number] REF# [id number]

## 2017-01-23 NOTE — Telephone Encounter (Signed)
Prior authorization was denied per cover my meds and patient started on different medication. See previous notes.

## 2017-01-24 ENCOUNTER — Telehealth: Payer: Self-pay

## 2017-01-24 NOTE — Telephone Encounter (Signed)
Called Pt to inform her the Trakendi rep. brought updated Rx cards. Pt will come Monday AM 01/27/17 to p/u

## 2017-01-28 ENCOUNTER — Telehealth: Payer: Self-pay

## 2017-01-28 NOTE — Telephone Encounter (Signed)
Inform patient that I reviewed her chart with most recent Pelvic US/Sonohysterogram and Endometrial Bx results.  If certain that she desires a hysterectomy, cancel [facility] and schedule visit with me to discuss and organize.

## 2017-01-28 NOTE — Telephone Encounter (Signed)
Patient saw Dr. Moshe Salisbury on 01/22/17 with persistent bleeding following ablation in office. He recommended another SHGM. She is scheduled for 02/12/17.  Patient called today stating she has decided she would rather just proceed with Hysterectomy instead of another ablation.  She asked does she still need to proceed with [facility] or just make appointment to come and see you about Hysterectomy?

## 2017-01-28 NOTE — Telephone Encounter (Signed)
Patient informed. Rosemarie Ax will call her to cancel [facility] and schedule appt with Dr. Marguerita Merles.

## 2017-01-31 ENCOUNTER — Ambulatory Visit (INDEPENDENT_AMBULATORY_CARE_PROVIDER_SITE_OTHER): Payer: 59 | Admitting: Obstetrics & Gynecology

## 2017-01-31 ENCOUNTER — Encounter: Payer: Self-pay | Admitting: Obstetrics & Gynecology

## 2017-01-31 VITALS — BP 140/90

## 2017-01-31 DIAGNOSIS — N921 Excessive and frequent menstruation with irregular cycle: Secondary | ICD-10-CM

## 2017-01-31 DIAGNOSIS — Z9889 Other specified postprocedural states: Secondary | ICD-10-CM | POA: Diagnosis not present

## 2017-01-31 DIAGNOSIS — E6609 Other obesity due to excess calories: Secondary | ICD-10-CM | POA: Diagnosis not present

## 2017-01-31 DIAGNOSIS — Z6834 Body mass index (BMI) 34.0-34.9, adult: Secondary | ICD-10-CM

## 2017-01-31 NOTE — Progress Notes (Signed)
    Kendra Hall 1965-12-31 [id number]        51 y.o.  [id number]   RP:  Continued heavy menses post Endometrial Ablation with HerOption 07/25/2016, desires Hysterectomy  Pelvic US 05/02/2016: Uterus measures 11.5 x 5.7 x 6.4 cm. Posterior sub mucosal fibroid measures 1.5 x 1.5 x 1.2 cm.  Endometrium Thickness: 10.8 mm. Endometrial filling defect is identified measuring 1.8 x 1.1 x 0.9 cm. Suspect polyp. Right ovary measures 4.4 x 3.0 x 4.0 cm. Normal appearance/no adnexal mass. Left ovary Measurements: Not visualized.  No abnormal free fluid.  Pelvic US 06/28/2016:  Uterus 9.1 x 6.1 x 4.9 cm with an endometrial stripe of 7 mm. 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 Right thick walled follicle 8 mm was noted, left ovary was normal. No fluid in the cul-de-sac.  SonoHystero 06/28/2016:  No intracavitary defect was noted. Endometrial biopsy done.   EBx result - PROLIFERATIVE ENDOMETRIUM. - NO HYPERPLASIA OR MALIGNANCY.  Endometrial Ablation with HerOption on 07/25/2016.  Since then, recurrence of heavy vaginal bleeding.  Started on Megace 40 mg BID which stopped the bleeding. Seen by Dr Toney Rakes 12/16/2016.  Cervical polyp removed and EBx done again.  Had a VasoVagal reaction and lost consciousness briefly.  1. Endometrium, biopsy - STRIPS OF INACTIVE ENDOMETRIUM - BENIGN LOWER UTERINE SEGMENT - BENIGN SQUAMOUS EPITHELIUM - BENIGN ENDOCERVICAL GLANDULAR EPITHELIUM - NO HYPERPLASIA OR MALIGNANCY IDENTIFIED 2. Cervix, polyp - BENIGN ENDOCERVICAL POLYP - NO MALIGNANCY IDENTIFIED  At this point, patient is requesting a Hysterectomy.  Past medical history,surgical history, problem list, medications, allergies, family history and social history were all reviewed and documented in the EPIC chart.  Directed ROS with pertinent positives and negatives documented in the history of present illness/assessment and plan.  Exam:  Vitals:   01/31/17 1547  BP: 140/90   General appearance:  Normal  Gyn  exam:  Vulva normal.  Bimanual exam:  Uterus AV, normal volume, mobile.  No Adnexal mass, NT.    Assessment/Plan:  51 y.o. [id number]   1. Menorrhagia with irregular cycle Post Endometrial Ablation with persistent heavy vaginal bleeding.  Hx/Investigation described above.  4 previous SVD.  Decision to proceed with TVH/Bilateral Salpingectomy.  Surgery and risks reviewed.  F/U Preop to further review Procedure/Risks/Postop.  2. Status post endometrial ablation Failed HerOption endometrial ablation with persistent Menometrorrhagia.  EBx benign 11/2016.   3. Class 1 obesity due to excess calories with serious comorbidity and body mass index (BMI) of 34.0 to 34.9 in adult Type 2 DM and tobacco smoker.  Recommend to stop smoking until surgery.  Counseling on above issues >50% x 25 minutes.  Princess Bruins MD, 4:29 PM 01/31/2017

## 2017-02-03 NOTE — Patient Instructions (Signed)
1. Menorrhagia with irregular cycle Post Endometrial Ablation with persistent heavy vaginal bleeding.  Hx/Investigation described above.  4 previous SVD.  Decision to proceed with TVH/Bilateral Salpingectomy.  Surgery and risks reviewed.  F/U Preop to further review Procedure/Risks/Postop.  2. Status post endometrial ablation Failed HerOption endometrial ablation with persistent Menometrorrhagia.  EBx benign 11/2016.   3. Class 1 obesity due to excess calories with serious comorbidity and body mass index (BMI) of 34.0 to 34.9 in adult Type 2 DM and tobacco smoker.  Recommend to stop smoking until surgery.  Kendra Hall, it was a pleasure to meet you today!     Vaginal Hysterectomy A vaginal hysterectomy is a procedure to remove all or part of the uterus through a small incision in the vagina. In this procedure, your health care provider may remove your entire uterus, including the lower end (cervix). You may need a vaginal hysterectomy to treat:  Uterine fibroids.  A condition that causes the lining of the uterus to grow in other areas (endometriosis).  Problems with pelvic support.  Cancer of the cervix, ovaries, uterus, or tissue that lines the uterus (endometrium).  Excessive (dysfunctional) uterine bleeding.  When removing your uterus, your health care provider may also remove the organs that produce eggs (ovaries) and the tubes that carry eggs to your uterus (fallopian tubes). After a vaginal hysterectomy, you will no longer be able to have a baby. You will also no longer get your menstrual period. Tell a health care provider about:  Any allergies you have.  All medicines you are taking, including vitamins, herbs, eye drops, creams, and over-the-counter medicines.  Any problems you or family members have had with anesthetic medicines.  Any blood disorders you have.  Any surgeries you have had.  Any medical conditions you have.  Whether you are pregnant or may be pregnant. What  are the risks? Generally, this is a safe procedure. However, problems may occur, including:  Bleeding.  Infection.  A blood clot that forms in your leg and travels to your lungs (pulmonary embolism).  Damage to surrounding organs.  Pain during sex.  What happens before the procedure?  Ask your health care provider what organs will be removed during surgery.  Ask your health care provider about: ? Changing or stopping your regular medicines. This is especially important if you are taking diabetes medicines or blood thinners. ? Taking medicines such as aspirin and ibuprofen. These medicines can thin your blood. Do not take these medicines before your procedure if your health care provider instructs you not to.  Follow instructions from your health care provider about eating or drinking restrictions.  Do not use any tobacco products, such as cigarettes, chewing tobacco, and e-cigarettes. If you need help quitting, ask your health care provider.  Plan to have someone take you home after discharge from the hospital. What happens during the procedure?  To reduce your risk of infection: ? Your health care team will wash or sanitize their hands. ? Your skin will be washed with soap.  An IV tube will be inserted into one of your veins.  You may be given antibiotic medicine to help prevent infection.  You will be given one or more of the following: ? A medicine to help you relax (sedative). ? A medicine to numb the area (local anesthetic). ? A medicine to make you fall asleep (general anesthetic). ? A medicine that is injected into an area of your body to numb everything beyond the injection site (regional  anesthetic).  Your surgeon will make an incision in your vagina.  Your surgeon will locate and remove all or part of your uterus.  Your ovaries and fallopian tubes may be removed at the same time.  The incision will be closed with stitches (sutures) that dissolve over  time. The procedure may vary among health care providers and hospitals. What happens after the procedure?  Your blood pressure, heart rate, breathing rate, and blood oxygen level will be monitored often until the medicines you were given have worn off.  You will be encouraged to get up and walk around after a few hours to help prevent complications.  You may have IV tubes in place for a few days.  You will be given pain medicine as needed.  Do not drive for 24 hours if you were given a sedative. This information is not intended to replace advice given to you by your health care provider. Make sure you discuss any questions you have with your health care provider. Document Released: 10/02/2015 Document Revised: 11/16/2015 Document Reviewed: 06/25/2015 Elsevier Interactive Patient Education  [employer].

## 2017-02-05 ENCOUNTER — Encounter: Payer: Self-pay | Admitting: Obstetrics & Gynecology

## 2017-02-05 ENCOUNTER — Ambulatory Visit (INDEPENDENT_AMBULATORY_CARE_PROVIDER_SITE_OTHER): Payer: 59 | Admitting: Obstetrics & Gynecology

## 2017-02-05 VITALS — BP 140/90

## 2017-02-05 DIAGNOSIS — N920 Excessive and frequent menstruation with regular cycle: Secondary | ICD-10-CM

## 2017-02-05 DIAGNOSIS — Z9889 Other specified postprocedural states: Secondary | ICD-10-CM | POA: Diagnosis not present

## 2017-02-05 NOTE — Progress Notes (Signed)
    Kendra Hall 03-Feb-1966 [id number]        51 y.o.  [id number]   RP:  Preop Persistent Menorrhagia for TVH.  HPI: S/P Endometrial Ablation with HerOption on 07/25/2016.  Since then, recurrence of heavy vaginal bleeding.  Started on Megace 40 mg BID which stopped the bleeding. Seen by Dr Toney Rakes 12/16/2016.  Cervical polyp removed and EBx done again.  Had a VasoVagal reaction and lost consciousness briefly.  EBx was benign.  Patient requests a Hysterectomy.   Past medical history,surgical history, problem list, medications, allergies, family history and social history were all reviewed and documented in the EPIC chart.  Directed ROS with pertinent positives and negatives documented in the history of present illness/assessment and plan.  Exam:  There were no vitals filed for this visit. General appearance:  Normal   Assessment/Plan:  51 y.o. [id number]   1. Menorrhagia with regular cycle S/P Endometrial Ablation.  EBx benign.  Uterine Fibroid.  SVD x 4.  Decision to proceed with Total Vaginal Hysterectomy.    2. S/P endometrial ablation                        Patient was counseled as to the risk of surgery to include the following:  1. Infection (prohylactic antibiotics will be administered)  2. DVT/Pulmonary Embolism (prophylactic pneumo compression stockings will be used)  3.Trauma to internal organs requiring additional surgical procedure to repair any injury to     Internal organs requiring perhaps additional hospitalization days.  4.Hemmorhage requiring transfusion and blood products which carry risks such as anaphylactic reaction, hepatitis and AIDS  Patient had received literature information on the procedure scheduled and all her questions were answered and fully accepts all risk.   Marie-Lyne LavoieMD6:40 PMTD    Princess Bruins MD, 3:47 PM 02/05/2017

## 2017-02-07 NOTE — Patient Instructions (Signed)
1. Menorrhagia with regular cycle S/P Endometrial Ablation.  EBx benign.  Uterine Fibroid.  SVD x 4.  Decision to proceed with Total Vaginal Hysterectomy.    2. S/P endometrial ablation                        Patient was counseled as to the risk of surgery to include the following:  1. Infection (prohylactic antibiotics will be administered)  2. DVT/Pulmonary Embolism (prophylactic pneumo compression stockings will be used)  3.Trauma to internal organs requiring additional surgical procedure to repair any injury to     Internal organs requiring perhaps additional hospitalization days.  4.Hemmorhage requiring transfusion and blood products which carry risks such as anaphylactic reaction, hepatitis and AIDS  Patient had received literature information on the procedure scheduled and all her questions were answered and fully accepts all risk.

## 2017-02-12 ENCOUNTER — Ambulatory Visit: Payer: 59 | Admitting: Obstetrics & Gynecology

## 2017-02-12 ENCOUNTER — Other Ambulatory Visit: Payer: 59

## 2017-02-13 ENCOUNTER — Other Ambulatory Visit: Payer: Self-pay | Admitting: *Deleted

## 2017-02-13 ENCOUNTER — Telehealth: Payer: Self-pay | Admitting: *Deleted

## 2017-02-13 MED ORDER — LEVOTHYROXINE SODIUM 125 MCG PO TABS
125.0000 ug | ORAL_TABLET | Freq: Every day | ORAL | 2 refills | Status: DC
Start: 2017-02-13 — End: 2017-07-30

## 2017-02-13 NOTE — Telephone Encounter (Signed)
Pt called requesting Rx for yeast c/o vaginal itching. Please advise

## 2017-02-14 MED ORDER — FLUCONAZOLE 150 MG PO TABS: 150.0000 mg | ORAL_TABLET | Freq: Every day | ORAL | 0 refills | Status: DC

## 2017-02-14 NOTE — Telephone Encounter (Signed)
Pt aware, Rx sent. 

## 2017-02-14 NOTE — Telephone Encounter (Signed)
Agree with Fluconazole 150 mg 1 tab PO daily x 3.  #3.

## 2017-02-26 ENCOUNTER — Encounter: Payer: Self-pay | Admitting: Endocrinology

## 2017-02-26 ENCOUNTER — Other Ambulatory Visit: Payer: Self-pay

## 2017-02-26 ENCOUNTER — Encounter: Payer: Self-pay | Admitting: Anesthesiology

## 2017-02-26 ENCOUNTER — Ambulatory Visit (INDEPENDENT_AMBULATORY_CARE_PROVIDER_SITE_OTHER): Payer: 59 | Admitting: Endocrinology

## 2017-02-26 VITALS — BP 122/90 | HR 87 | Wt 190.6 lb

## 2017-02-26 DIAGNOSIS — E119 Type 2 diabetes mellitus without complications: Secondary | ICD-10-CM

## 2017-02-26 DIAGNOSIS — Z794 Long term (current) use of insulin: Secondary | ICD-10-CM | POA: Diagnosis not present

## 2017-02-26 LAB — POCT GLYCOSYLATED HEMOGLOBIN (HGB A1C): Hemoglobin A1C: 6.8

## 2017-02-26 MED ORDER — ONETOUCH VERIO VI SOLN: 0 refills | Status: DC

## 2017-02-26 MED ORDER — REPAGLINIDE 0.5 MG PO TABS
0.5000 mg | ORAL_TABLET | Freq: Three times a day (TID) | ORAL | 11 refills | Status: DC
Start: 2017-02-26 — End: 2017-08-06

## 2017-02-26 NOTE — Progress Notes (Signed)
Subjective:    Patient ID: Kendra Hall, female    DOB: Jun 24, 1966, 51 y.o.   MRN: [id number]  HPI Pt returns for f/u of diabetes mellitus:  DM type: Insulin-requiring type 2.  Dx'ed: 5456 Complications: none Therapy: insulin since 2563, trulicity, and invokana.  GDM: 1984.   DKA: never.   Severe hypoglycemia: once, in 2018.  Pancreatitis: never.  Other: she takes multiple daily injections; she takes both trulicity and victoza, and she wants to continue.  Interval history:  She says she never misses the insulin.  no cbg record, but states cbg's are mildly low approx twice a week.  This happens at any time of day.  She takes humalog, just 4 units 3 times a day (just before each meal).  pt states she feels well in general. Past Medical History:  Diagnosis Date  . Allergy   . Anxiety   . Asthma   . Depression   . Diabetes mellitus   . Fibromyalgia   . Gastric ulcer   . GERD (gastroesophageal reflux disease)   . Grave's disease   . Graves disease   . H/O therapeutic radiation   . Migraine   . Multiple thyroid nodules   . Myofacial muscle pain   . Osteoarthritis    Facet hypertrophy with injections  . TIA (transient ischemic attack)    02/2015    Past Surgical History:  Procedure Laterality Date  . AXILLARY LYMPH NODE DISSECTION  2001  . HYDRADENITIS EXCISION    . TUBAL LIGATION  1995    Social History   Social History  . Marital status: Single    Spouse name: N/A  . Number of children: 4  . Years of education: 14   Occupational History  . customer service [place] History Main Topics  . Smoking status: Former Smoker    Packs/day: 0.50    Types: Cigarettes  . Smokeless tobacco: Never Used  . Alcohol use No     Comment: rare  . 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  Today denies all drug abuse  . Sexual activity: Not Currently    Birth control/ protection:  Surgical   Other Topics Concern  . Not on file   Social History Narrative   Lives alone in [place]. Her daugther occassoinally comes and stay with her. 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  originally from [place],  [place].  Raised by mom and her sister who is 8 yrs older than pt. Pt has several half siblings. No longer on disability. Pt has 4 kids, never married.              Current Outpatient Prescriptions on File Prior to Visit  Medication Sig Dispense Refill  . albuterol (PROAIR HFA) 108 (90 Base) MCG/ACT inhaler Inhale 2 puffs into the lungs every 4 (four) hours as needed for wheezing. 1 Inhaler 5  . atenolol (TENORMIN) 50 MG tablet Take 1 tablet (50 mg total) by mouth daily. 30 tablet 3  . canagliflozin (INVOKANA) 300 MG TABS tablet Take 1 tablet (300 mg total) by mouth daily before breakfast. 30 tablet 4  . Dulaglutide (TRULICITY) 1.5 SL/3.7DS SOPN Inject 1.5 mg weekly. 4 pen 11  . escitalopram (LEXAPRO) 20 MG tablet TAKE 1 TABLET(20 MG) BY MOUTH  DAILY 90 tablet 3  . fluconazole (DIFLUCAN) 150 MG tablet Take 1 tablet (150 mg total) by mouth daily. 3 tablet 0  . glucose blood (ONETOUCH VERIO) test strip 1 each by Other route 4 (four) times daily. And lancets 4/day 450 each 12  . levothyroxine (SYNTHROID, LEVOTHROID) 125 MCG tablet Take 1 tablet (125 mcg total) by mouth daily. 60 tablet 2  . linaclotide (LINZESS) 145 MCG CAPS capsule Take 145 mcg by mouth daily before breakfast.    . liraglutide (VICTOZA) 18 MG/3ML SOPN Inject 0.2 mLs (1.2 mg total) into the skin daily. 9 mL 3  . megestrol (MEGACE) 40 MG tablet Take 1 tablet (40 mg total) by mouth 2 (two) times daily. 20 tablet 1  . naproxen (NAPROSYN) 500 MG tablet TAKE 1 TABLET(500 MG) BY MOUTH EVERY 12 HOURS AS NEEDED 180 tablet 2  . nortriptyline (PAMELOR) 25 MG capsule TAKE 3 CAPSULES(75 MG) BY MOUTH AT BEDTIME (Patient taking differently: TAKE 3  CAPSULES(75 MG) BY MOUTH AT BEDTIME AS NEEDED FOR SLEEP) 270 capsule 3  . ondansetron (ZOFRAN) 4 MG tablet Take 1 tablet (4 mg total) by mouth every 8 (eight) hours as needed for nausea or vomiting. 20 tablet 2  . Polyethyl Glycol-Propyl Glycol (SYSTANE OP) Apply 1-2 drops to eye daily as needed (dryness).    . rizatriptan (MAXALT-MLT) 10 MG disintegrating tablet Take 1 tablet earliest onset of headache.  May repeat once in 2 hours if needed 9 tablet 11   No current facility-administered medications on file prior to visit.     Allergies  Allergen Reactions  . Ergotrate [Ergonovine] Other (See Comments)    Pt does not ever want  . Ibuprofen Other (See Comments)    Patient has ulcer  . Metformin And Related Other (See Comments)    Dizziness  . Yutopar [Ritodrine] Other (See Comments)    Pt does not ever want    Family History  Problem Relation Age of Onset  . Hypertension Mother   . Diabetes Mother   . Cancer Mother        rectal  . Heart disease Father 71       MI x5  . Hypertension Father   . Diabetes Father   . Cancer Sister 34       breast  . Drug abuse Brother   . Alcohol abuse Brother   . Drug abuse Brother   . Alcohol abuse Brother   . Anxiety disorder Neg Hx   . Bipolar disorder Neg Hx   . Depression Neg Hx     BP 122/90   Pulse 87   Wt 190 lb 9.6 oz (86.5 kg)   SpO2 98%   BMI 32.97 kg/m    Review of Systems Denies LOC.  She has lost 9 more lbs.      Objective:   Physical Exam VITAL SIGNS:  See vs page GENERAL: no distress Pulses: foot pulses are intact bilaterally.   MSK: no deformity of the feet or ankles.  CV: no edema of the legs or ankles Skin:  no ulcer on the feet or ankles.  normal color and temp on the feet and ankles Neuro: sensation is intact to touch on the feet and ankles.   Lab Results  Component Value Date   HGBA1C 6.8 02/26/2017      Assessment & Plan:  Type 2 DM: she is ready to transition off insulin.   Patient  Instructions  Please change the insulin to "repaglinide,"  3 times a day (just before each meal).  Please continue the same other diabetes medications.  check your blood sugar once a day.  vary the time of day when you check, between before the 3 meals, and at bedtime.  also check if you have symptoms of your blood sugar being too high or too low.  please keep a record of the readings and bring it to your next appointment here (or you can bring the meter itself).  You can write it on any piece of paper.  please call us sooner if your blood sugar goes below 70, or if you have a lot of readings over 200.  Please come back for a follow-up appointment in 2 months.

## 2017-02-26 NOTE — Patient Instructions (Addendum)
Please change the insulin to "repaglinide," 3 times a day (just before each meal).  Please continue the same other diabetes medications.  check your blood sugar once a day.  vary the time of day when you check, between before the 3 meals, and at bedtime.  also check if you have symptoms of your blood sugar being too high or too low.  please keep a record of the readings and bring it to your next appointment here (or you can bring the meter itself).  You can write it on any piece of paper.  please call us sooner if your blood sugar goes below 70, or if you have a lot of readings over 200.  Please come back for a follow-up appointment in 2 months.

## 2017-03-03 NOTE — Patient Instructions (Addendum)
Your procedure is scheduled on:  Tuesday, Sept. 18, 2018  Enter through the [employer] [facility] at:  6:00 AM  Pick up the phone at the desk and dial [phone].  Call this number if you have problems the morning of surgery: [phone].  Remember: Do NOT eat food or drink after:  Midnight Monday  Take these medicines the morning of surgery with a SIP OF WATER:  None  Bring Asthma Inhaler day of surgery  Stop ALL herbal medications at this time  Do NOT smoke the day of surgery.  Do NOT wear jewelry (body piercing), metal hair clips/bobby pins, make-up, artifical eyelashes or nail polish. Do NOT wear lotions, powders, or perfumes.  You may wear deodorant. Do NOT shave for 48 hours prior to surgery. Do NOT bring valuables to the hospital. Contacts, dentures, or bridgework may not be worn into surgery.  Leave suitcase in car.  After surgery it may be brought to your room.  For patients admitted to the hospital, checkout time is 11:00 AM the day of discharge.  Bring a copy of your healthcare power of attorney and living will documents.

## 2017-03-04 ENCOUNTER — Encounter (HOSPITAL_COMMUNITY)
Admission: RE | Admit: 2017-03-04 | Discharge: 2017-03-04 | Disposition: A | Discharge status: Home / Self Care | Payer: 59 | Source: Ambulatory Visit | Attending: Obstetrics & Gynecology | Admitting: Obstetrics & Gynecology

## 2017-03-04 ENCOUNTER — Encounter (HOSPITAL_COMMUNITY): Payer: Self-pay

## 2017-03-04 DIAGNOSIS — K219 Gastro-esophageal reflux disease without esophagitis: Secondary | ICD-10-CM | POA: Insufficient documentation

## 2017-03-04 DIAGNOSIS — I10 Essential (primary) hypertension: Secondary | ICD-10-CM | POA: Insufficient documentation

## 2017-03-04 DIAGNOSIS — F333 Major depressive disorder, recurrent, severe with psychotic symptoms: Secondary | ICD-10-CM | POA: Insufficient documentation

## 2017-03-04 DIAGNOSIS — G8929 Other chronic pain: Secondary | ICD-10-CM | POA: Insufficient documentation

## 2017-03-04 DIAGNOSIS — Z803 Family history of malignant neoplasm of breast: Secondary | ICD-10-CM | POA: Insufficient documentation

## 2017-03-04 DIAGNOSIS — E669 Obesity, unspecified: Secondary | ICD-10-CM | POA: Diagnosis not present

## 2017-03-04 DIAGNOSIS — F4310 Post-traumatic stress disorder, unspecified: Secondary | ICD-10-CM | POA: Diagnosis not present

## 2017-03-04 DIAGNOSIS — G43709 Chronic migraine without aura, not intractable, without status migrainosus: Secondary | ICD-10-CM | POA: Insufficient documentation

## 2017-03-04 DIAGNOSIS — Z01812 Encounter for preprocedural laboratory examination: Secondary | ICD-10-CM | POA: Diagnosis not present

## 2017-03-04 DIAGNOSIS — E0500 Thyrotoxicosis with diffuse goiter without thyrotoxic crisis or storm: Secondary | ICD-10-CM | POA: Insufficient documentation

## 2017-03-04 DIAGNOSIS — F17200 Nicotine dependence, unspecified, uncomplicated: Secondary | ICD-10-CM | POA: Insufficient documentation

## 2017-03-04 DIAGNOSIS — F39 Unspecified mood [affective] disorder: Secondary | ICD-10-CM | POA: Diagnosis not present

## 2017-03-04 DIAGNOSIS — M545 Low back pain: Secondary | ICD-10-CM | POA: Diagnosis not present

## 2017-03-04 DIAGNOSIS — F411 Generalized anxiety disorder: Secondary | ICD-10-CM | POA: Insufficient documentation

## 2017-03-04 DIAGNOSIS — E890 Postprocedural hypothyroidism: Secondary | ICD-10-CM | POA: Insufficient documentation

## 2017-03-04 DIAGNOSIS — E119 Type 2 diabetes mellitus without complications: Secondary | ICD-10-CM | POA: Diagnosis not present

## 2017-03-04 DIAGNOSIS — G4700 Insomnia, unspecified: Secondary | ICD-10-CM | POA: Insufficient documentation

## 2017-03-04 DIAGNOSIS — J4520 Mild intermittent asthma, uncomplicated: Secondary | ICD-10-CM | POA: Diagnosis not present

## 2017-03-04 HISTORY — DX: Anemia, unspecified: D64.9

## 2017-03-04 LAB — CBC
HCT: 43.6 % (ref 36.0–46.0)
Hemoglobin: 14.5 g/dL (ref 12.0–15.0)
MCH: 27.9 pg (ref 26.0–34.0)
MCHC: 33.3 g/dL (ref 30.0–36.0)
MCV: 83.8 fL (ref 78.0–100.0)
Platelets: 311 10*3/uL (ref 150–400)
RBC: 5.2 MIL/uL — ABNORMAL HIGH (ref 3.87–5.11)
RDW: 14.5 % (ref 11.5–15.5)
WBC: 7.6 10*3/uL (ref 4.0–10.5)

## 2017-03-04 LAB — TYPE AND SCREEN
ABO/RH(D): B POS
Antibody Screen: NEGATIVE

## 2017-03-04 LAB — ABO/RH: ABO/RH(D): B POS
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
## 2017-03-11 ENCOUNTER — Encounter (HOSPITAL_COMMUNITY): Payer: Self-pay

## 2017-03-11 ENCOUNTER — Encounter: Payer: Self-pay | Admitting: Anesthesiology

## 2017-03-11 ENCOUNTER — Ambulatory Visit (HOSPITAL_COMMUNITY): Payer: 59 | Admitting: Anesthesiology

## 2017-03-11 ENCOUNTER — Observation Stay (HOSPITAL_COMMUNITY)
Admission: AD | Admit: 2017-03-11 | Discharge: 2017-03-12 | Disposition: A | Discharge status: Home / Self Care | Payer: 59 | Source: Ambulatory Visit | Attending: Obstetrics & Gynecology | Admitting: Obstetrics & Gynecology

## 2017-03-11 ENCOUNTER — Encounter (HOSPITAL_COMMUNITY)
Admission: AD | Disposition: A | Discharge status: Home / Self Care | Payer: Self-pay | Source: Ambulatory Visit | Attending: Obstetrics & Gynecology

## 2017-03-11 DIAGNOSIS — F17210 Nicotine dependence, cigarettes, uncomplicated: Secondary | ICD-10-CM | POA: Diagnosis not present

## 2017-03-11 DIAGNOSIS — Z9851 Tubal ligation status: Secondary | ICD-10-CM | POA: Insufficient documentation

## 2017-03-11 DIAGNOSIS — M797 Fibromyalgia: Secondary | ICD-10-CM | POA: Insufficient documentation

## 2017-03-11 DIAGNOSIS — Z8673 Personal history of transient ischemic attack (TIA), and cerebral infarction without residual deficits: Secondary | ICD-10-CM | POA: Diagnosis not present

## 2017-03-11 DIAGNOSIS — Z9889 Other specified postprocedural states: Secondary | ICD-10-CM

## 2017-03-11 DIAGNOSIS — E119 Type 2 diabetes mellitus without complications: Secondary | ICD-10-CM | POA: Diagnosis not present

## 2017-03-11 DIAGNOSIS — N920 Excessive and frequent menstruation with regular cycle: Principal | ICD-10-CM | POA: Insufficient documentation

## 2017-03-11 DIAGNOSIS — F329 Major depressive disorder, single episode, unspecified: Secondary | ICD-10-CM | POA: Diagnosis not present

## 2017-03-11 DIAGNOSIS — N841 Polyp of cervix uteri: Secondary | ICD-10-CM | POA: Diagnosis not present

## 2017-03-11 DIAGNOSIS — N7011 Chronic salpingitis: Secondary | ICD-10-CM | POA: Diagnosis not present

## 2017-03-11 DIAGNOSIS — N921 Excessive and frequent menstruation with irregular cycle: Secondary | ICD-10-CM | POA: Diagnosis not present

## 2017-03-11 DIAGNOSIS — Z98891 History of uterine scar from previous surgery: Secondary | ICD-10-CM | POA: Diagnosis not present

## 2017-03-11 DIAGNOSIS — Z79899 Other long term (current) drug therapy: Secondary | ICD-10-CM | POA: Insufficient documentation

## 2017-03-11 DIAGNOSIS — J45909 Unspecified asthma, uncomplicated: Secondary | ICD-10-CM | POA: Insufficient documentation

## 2017-03-11 DIAGNOSIS — N800 Endometriosis of uterus: Secondary | ICD-10-CM | POA: Insufficient documentation

## 2017-03-11 DIAGNOSIS — D250 Submucous leiomyoma of uterus: Secondary | ICD-10-CM | POA: Insufficient documentation

## 2017-03-11 HISTORY — PX: VAGINAL HYSTERECTOMY: SHX2639

## 2017-03-11 LAB — GLUCOSE, CAPILLARY
Glucose-Capillary: 127 mg/dL — ABNORMAL HIGH (ref 65–99)
Glucose-Capillary: 146 mg/dL — ABNORMAL HIGH (ref 65–99)
Glucose-Capillary: 140 mg/dL — ABNORMAL HIGH (ref 65–99)

## 2017-03-11 LAB — PREGNANCY, URINE: Preg Test, Ur: NEGATIVE

## 2017-03-11 SURGERY — HYSTERECTOMY, VAGINAL
Anesthesia: General | Site: Vagina | Laterality: Left

## 2017-03-11 MED ORDER — PHENYLEPHRINE 40 MCG/ML (10ML) SYRINGE FOR IV PUSH (FOR BLOOD PRESSURE SUPPORT)
PREFILLED_SYRINGE | INTRAVENOUS | Status: AC
  Filled 2017-03-11: qty 10

## 2017-03-11 MED ORDER — LIDOCAINE HCL (CARDIAC) 20 MG/ML IV SOLN
INTRAVENOUS | Status: DC | PRN
  Administered 2017-03-11: 30 mg via INTRAVENOUS

## 2017-03-11 MED ORDER — LACTATED RINGERS IV SOLN
INTRAVENOUS | Status: DC
  Administered 2017-03-11: 10:00:00 via INTRAVENOUS
  Administered 2017-03-11: 125 mL/h via INTRAVENOUS
  Administered 2017-03-11: 08:00:00 via INTRAVENOUS

## 2017-03-11 MED ORDER — CEFAZOLIN SODIUM-DEXTROSE 2-4 GM/100ML-% IV SOLN
INTRAVENOUS | Status: AC
  Filled 2017-03-11: qty 100

## 2017-03-11 MED ORDER — ESCITALOPRAM OXALATE 20 MG PO TABS
20.0000 mg | ORAL_TABLET | Freq: Every day | ORAL | Status: DC
  Administered 2017-03-11: 20 mg via ORAL
  Filled 2017-03-11 (×2): qty 1

## 2017-03-11 MED ORDER — CANAGLIFLOZIN 300 MG PO TABS
300.0000 mg | ORAL_TABLET | Freq: Every day | ORAL | Status: DC
  Administered 2017-03-11 – 2017-03-12 (×2): 300 mg via ORAL
  Filled 2017-03-11 (×3): qty 1

## 2017-03-11 MED ORDER — ONDANSETRON HCL 4 MG/2ML IJ SOLN
INTRAMUSCULAR | Status: AC
  Filled 2017-03-11: qty 2

## 2017-03-11 MED ORDER — LIDOCAINE-EPINEPHRINE 1 %-1:100000 IJ SOLN
INTRAMUSCULAR | Status: DC | PRN
  Administered 2017-03-11: 10 mL

## 2017-03-11 MED ORDER — SCOPOLAMINE 1 MG/3DAYS TD PT72
MEDICATED_PATCH | TRANSDERMAL | Status: DC
Start: 2017-03-11 — End: 2017-03-12
  Administered 2017-03-11: 1.5 mg via TRANSDERMAL
  Filled 2017-03-11: qty 1

## 2017-03-11 MED ORDER — LEVOTHYROXINE SODIUM 125 MCG PO TABS
125.0000 ug | ORAL_TABLET | Freq: Every day | ORAL | Status: DC
  Filled 2017-03-11: qty 1

## 2017-03-11 MED ORDER — CEFAZOLIN SODIUM-DEXTROSE 2-4 GM/100ML-% IV SOLN
2.0000 g | INTRAVENOUS | Status: AC
  Administered 2017-03-11: 2 g via INTRAVENOUS

## 2017-03-11 MED ORDER — LIDOCAINE HCL (CARDIAC) 20 MG/ML IV SOLN
INTRAVENOUS | Status: AC
  Filled 2017-03-11: qty 5

## 2017-03-11 MED ORDER — DEXAMETHASONE SODIUM PHOSPHATE 10 MG/ML IJ SOLN
INTRAMUSCULAR | Status: DC | PRN
  Administered 2017-03-11: 4 mg via INTRAVENOUS

## 2017-03-11 MED ORDER — PROMETHAZINE HCL 25 MG/ML IJ SOLN: 6.2500 mg | INTRAMUSCULAR | Status: DC | PRN

## 2017-03-11 MED ORDER — HYDROMORPHONE HCL 1 MG/ML IJ SOLN
0.5000 mg | INTRAMUSCULAR | Status: DC | PRN
  Administered 2017-03-11: 0.5 mg via INTRAVENOUS
  Filled 2017-03-11: qty 0.5

## 2017-03-11 MED ORDER — MEPERIDINE HCL 25 MG/ML IJ SOLN: 6.2500 mg | INTRAMUSCULAR | Status: DC | PRN

## 2017-03-11 MED ORDER — BENZONATATE 100 MG PO CAPS
100.0000 mg | ORAL_CAPSULE | Freq: Two times a day (BID) | ORAL | Status: DC | PRN
  Administered 2017-03-11 – 2017-03-12 (×2): 100 mg via ORAL
  Filled 2017-03-11 (×4): qty 1

## 2017-03-11 MED ORDER — HYDROMORPHONE HCL 1 MG/ML IJ SOLN
INTRAMUSCULAR | Status: AC
  Administered 2017-03-11: 0.5 mg via INTRAVENOUS
  Filled 2017-03-11: qty 1

## 2017-03-11 MED ORDER — PHENYLEPHRINE HCL 10 MG/ML IJ SOLN
INTRAMUSCULAR | Status: DC | PRN
  Administered 2017-03-11 (×5): .04 mg via INTRAVENOUS

## 2017-03-11 MED ORDER — MIDAZOLAM HCL 2 MG/2ML IJ SOLN
INTRAMUSCULAR | Status: AC
  Filled 2017-03-11: qty 2

## 2017-03-11 MED ORDER — HYDROMORPHONE HCL 1 MG/ML IJ SOLN
INTRAMUSCULAR | Status: AC
  Filled 2017-03-11: qty 0.5

## 2017-03-11 MED ORDER — FENTANYL CITRATE (PF) 100 MCG/2ML IJ SOLN
INTRAMUSCULAR | Status: DC | PRN
  Administered 2017-03-11 (×2): 100 ug via INTRAVENOUS

## 2017-03-11 MED ORDER — LEVOTHYROXINE SODIUM 125 MCG PO TABS
125.0000 ug | ORAL_TABLET | Freq: Every day | ORAL | Status: DC
  Administered 2017-03-11: 125 ug via ORAL
  Filled 2017-03-11: qty 1

## 2017-03-11 MED ORDER — ROCURONIUM BROMIDE 100 MG/10ML IV SOLN
INTRAVENOUS | Status: AC
  Filled 2017-03-11: qty 1

## 2017-03-11 MED ORDER — TOPIRAMATE ER 100 MG PO CAP24
1.0000 | ORAL_CAPSULE | Freq: Every day | ORAL | Status: DC
  Administered 2017-03-11: 22:00:00 via ORAL
  Filled 2017-03-11: qty 1

## 2017-03-11 MED ORDER — LINACLOTIDE 145 MCG PO CAPS
145.0000 ug | ORAL_CAPSULE | Freq: Every day | ORAL | Status: DC
  Administered 2017-03-12: 145 ug via ORAL
  Filled 2017-03-11 (×3): qty 1

## 2017-03-11 MED ORDER — MIDAZOLAM HCL 2 MG/2ML IJ SOLN
INTRAMUSCULAR | Status: DC | PRN
  Administered 2017-03-11: 2 mg via INTRAVENOUS

## 2017-03-11 MED ORDER — EPHEDRINE SULFATE 50 MG/ML IJ SOLN: INTRAMUSCULAR | Status: DC | PRN

## 2017-03-11 MED ORDER — ACETAMINOPHEN 325 MG PO TABS: 650.0000 mg | ORAL_TABLET | ORAL | Status: DC | PRN

## 2017-03-11 MED ORDER — LIDOCAINE-EPINEPHRINE 1 %-1:100000 IJ SOLN
INTRAMUSCULAR | Status: AC
  Filled 2017-03-11: qty 1

## 2017-03-11 MED ORDER — ACETAMINOPHEN 10 MG/ML IV SOLN
1000.0000 mg | Freq: Once | INTRAVENOUS | Status: AC
  Administered 2017-03-11: 1000 mg via INTRAVENOUS
  Filled 2017-03-11: qty 100

## 2017-03-11 MED ORDER — HYDROMORPHONE HCL 1 MG/ML IJ SOLN
0.2500 mg | INTRAMUSCULAR | Status: DC | PRN
  Administered 2017-03-11 (×3): 0.5 mg via INTRAVENOUS

## 2017-03-11 MED ORDER — ACETAMINOPHEN 10 MG/ML IV SOLN: 1000.0000 mg | Freq: Once | INTRAVENOUS | Status: DC | PRN

## 2017-03-11 MED ORDER — OXYCODONE-ACETAMINOPHEN 5-325 MG PO TABS
1.0000 | ORAL_TABLET | ORAL | Status: DC | PRN
  Administered 2017-03-11 (×3): 1 via ORAL
  Administered 2017-03-11 – 2017-03-12 (×3): 2 via ORAL
  Filled 2017-03-11 (×2): qty 1
  Filled 2017-03-11 (×4): qty 2

## 2017-03-11 MED ORDER — SCOPOLAMINE 1 MG/3DAYS TD PT72
1.0000 | MEDICATED_PATCH | Freq: Once | TRANSDERMAL | Status: DC
  Administered 2017-03-11: 1.5 mg via TRANSDERMAL

## 2017-03-11 MED ORDER — ROCURONIUM BROMIDE 100 MG/10ML IV SOLN
INTRAVENOUS | Status: DC | PRN
  Administered 2017-03-11: 50 mg via INTRAVENOUS

## 2017-03-11 MED ORDER — FENTANYL CITRATE (PF) 250 MCG/5ML IJ SOLN
INTRAMUSCULAR | Status: AC
  Filled 2017-03-11: qty 5

## 2017-03-11 MED ORDER — SUGAMMADEX SODIUM 200 MG/2ML IV SOLN
INTRAVENOUS | Status: DC | PRN
  Administered 2017-03-11: 170 mg via INTRAVENOUS

## 2017-03-11 MED ORDER — LACTATED RINGERS IV SOLN
INTRAVENOUS | Status: DC
  Administered 2017-03-11 – 2017-03-12 (×3): via INTRAVENOUS

## 2017-03-11 MED ORDER — DEXAMETHASONE SODIUM PHOSPHATE 4 MG/ML IJ SOLN
INTRAMUSCULAR | Status: AC
  Filled 2017-03-11: qty 1

## 2017-03-11 MED ORDER — PROPOFOL 10 MG/ML IV BOLUS
INTRAVENOUS | Status: DC | PRN
  Administered 2017-03-11: 150 mg via INTRAVENOUS

## 2017-03-11 MED ORDER — LIRAGLUTIDE 18 MG/3ML ~~LOC~~ SOPN
1.2000 mg | PEN_INJECTOR | Freq: Every day | SUBCUTANEOUS | Status: DC
  Administered 2017-03-11: 1.2 mg via SUBCUTANEOUS
  Filled 2017-03-11 (×2): qty 3

## 2017-03-11 MED ORDER — REPAGLINIDE 0.5 MG PO TABS
0.5000 mg | ORAL_TABLET | Freq: Three times a day (TID) | ORAL | Status: DC
  Administered 2017-03-11 – 2017-03-12 (×3): 0.5 mg via ORAL
  Filled 2017-03-11 (×5): qty 1

## 2017-03-11 MED ORDER — ONDANSETRON HCL 4 MG/2ML IJ SOLN
INTRAMUSCULAR | Status: DC | PRN
  Administered 2017-03-11: 4 mg via INTRAVENOUS

## 2017-03-11 MED ORDER — PROPOFOL 10 MG/ML IV BOLUS
INTRAVENOUS | Status: AC
  Filled 2017-03-11: qty 20

## 2017-03-11 SURGICAL SUPPLY — 22 items
CANISTER SUCT 3000ML PPV (MISCELLANEOUS) ×3 IMPLANT
CLOTH BEACON ORANGE TIMEOUT ST (SAFETY) ×3 IMPLANT
CONT PATH 16OZ SNAP LID 3702 (MISCELLANEOUS) ×2 IMPLANT
DRAPE STERI URO 9X17 APER PCH (DRAPES) ×3 IMPLANT
ELECT REM PT RETURN 9FT ADLT (ELECTROSURGICAL) ×3
ELECTRODE REM PT RTRN 9FT ADLT (ELECTROSURGICAL) IMPLANT
GLOVE BIO SURGEON STRL SZ 6.5 (GLOVE) ×2 IMPLANT
GLOVE BIO SURGEONS STRL SZ 6.5 (GLOVE) ×1
GLOVE BIOGEL PI IND STRL 7.0 (GLOVE) ×2 IMPLANT
GLOVE BIOGEL PI INDICATOR 7.0 (GLOVE) ×4
GOWN STRL REUS W/TWL LRG LVL3 (GOWN DISPOSABLE) ×12 IMPLANT
PACK VAGINAL WOMENS (CUSTOM PROCEDURE TRAY) ×3 IMPLANT
PAD OB MATERNITY 4.3X12.25 (PERSONAL CARE ITEMS) ×3 IMPLANT
SUT VIC AB 0 CT1 27 (SUTURE) ×3
SUT VIC AB 0 CT1 27XBRD ANBCTR (SUTURE) ×1 IMPLANT
SUT VIC AB 2-0 CT1 27 (SUTURE)
SUT VIC AB 2-0 CT1 TAPERPNT 27 (SUTURE) IMPLANT
SUT VICRYL #0 CTB 1 (SUTURE) ×9 IMPLANT
SUT VICRYL 1 TIES 12X18 (SUTURE) ×3 IMPLANT
SYR BULB IRRIGATION 50ML (SYRINGE) ×2 IMPLANT
TOWEL OR 17X24 6PK STRL BLUE (TOWEL DISPOSABLE) ×6 IMPLANT
TRAY FOLEY CATH SILVER 14FR (SET/KITS/TRAYS/PACK) ×3 IMPLANT

## 2017-03-11 NOTE — Anesthesia Postprocedure Evaluation (Signed)
Anesthesia Post Note  Patient: Kendra Hall  Procedure(s) Performed: Procedure(s) (LRB): HYSTERECTOMY VAGINAL W/LEFT PROXIMAL SALPINGECTOMY (Left)     Patient location during evaluation: [facility] Anesthesia Type: General Level of consciousness: awake, awake and alert, oriented and patient cooperative Pain management: satisfactory to patient (RN aware of pain, at bedside to administer pain med) Vital Signs Assessment: post-procedure vital signs reviewed and stable Respiratory status: spontaneous breathing, nonlabored ventilation and respiratory function stable Cardiovascular status: stable Postop Assessment: no apparent nausea or vomiting Anesthetic complications: no    Last Vitals:  Vitals:   03/11/17 1040 03/11/17 1148  BP: 124/71 133/70  Pulse: 80 90  Resp: 16 18  Temp: 36.7 C 36.8 C  SpO2: 97% 95%    Last Pain:  Vitals:   03/11/17 1243  TempSrc:   PainSc: 9    Pain Goal: Patients Stated Pain Goal: 3 (03/11/17 1243)               Liyanna Cartwright L

## 2017-03-11 NOTE — Transfer of Care (Signed)
Immediate Anesthesia Transfer of Care Note  Patient: Kendra Hall  Procedure(s) Performed: Procedure(s) with comments: HYSTERECTOMY VAGINAL W/LEFT PROXIMAL SALPINGECTOMY (Left) - request 7:30am OR time  request 2 hours    Patient Location: PACU  Anesthesia Type:General  Level of Consciousness: awake, alert , oriented and patient cooperative  Airway & Oxygen Therapy: Patient Spontanous Breathing and Patient connected to nasal cannula oxygen  Post-op Assessment: Report given to RN, Post -op Vital signs reviewed and stable and Patient moving all extremities X 4  Post vital signs: Reviewed and stable  Last Vitals:  Vitals:   03/11/17 0624  BP: 129/69  Pulse: 80  Resp: 20  Temp: 36.7 C  SpO2: 98%    Last Pain:  Vitals:   03/11/17 0624  TempSrc: Oral      Patients Stated Pain Goal: 5 (61/53/79 4327)  Complications: No apparent anesthesia complications

## 2017-03-11 NOTE — Anesthesia Procedure Notes (Signed)
Procedure Name: Intubation Date/Time: 03/11/2017 7:39 AM Performed by: Gilmer Mor R Pre-anesthesia Checklist: Patient identified, Patient being monitored, Timeout performed, Emergency Drugs available and Suction available Patient Re-evaluated:Patient Re-evaluated prior to induction Oxygen Delivery Method: Circle System Utilized Preoxygenation: Pre-oxygenation with 100% oxygen Induction Type: IV induction Ventilation: Mask ventilation without difficulty Laryngoscope Size: Mac and 3 Grade View: Grade II Tube type: Oral Tube size: 7.0 mm Number of attempts: 1 Airway Equipment and Method: stylet Placement Confirmation: ETT inserted through vocal cords under direct vision,  positive ETCO2 and breath sounds checked- equal and bilateral Secured at: 21 cm Tube secured with: Tape Dental Injury: Teeth and Oropharynx as per pre-operative assessment

## 2017-03-11 NOTE — Op Note (Signed)
Operative Note  03/11/2017  9:45 AM  PATIENT:  Kendra Hall  51 y.o. female  PRE-OPERATIVE DIAGNOSIS:  Menometrorrhagia w polyps, submucous myoma, failed endometrial ablation  POST-OPERATIVE DIAGNOSIS:  Menomerorrhagia w polyps, submucous myoma, failed endometrial ablation  PROCEDURE:  Procedure(s): HYSTERECTOMY VAGINAL with PROXIMAL SALPINGECTOMY  SURGEON:  Surgeon(s): Princess Bruins, MD Fontaine, Belinda Block, MD  ANESTHESIA:   general  FINDINGS:  Uterus with Fibroids  DESCRIPTION OF OPERATION:  Under general anesthesia with endotracheal intubation the patient is in lithotomy position.  She is prepped with Betadine on the suprapubic, vulvar and vaginal areas and draped as usual. A Foley is inserted in the bladder. The vaginal exam reveals an anteverted uterus about 9 cm mobile no adnexal mass.  The weighted speculum is inserted in the vagina and the anterior lip of the cervix is grasped with a tenaculum.  Infiltration of lidocaine 1% with epinephrine circumferentially at the junction of the cervix and vagina.  The junction is circumferentially opened with the scalpel.  The vaginal mucosa is retracted anteriorly with a sponge on a finger.  The anterior  visceral peritoneum is not opened at that point.  We go posteriorly and successfully opened the posterior visceral peritoneum.  The long weighted speculum is inserted at that level to protect the rectum.  The uterosacral and cardinal ligaments are clamped on the left side with a curved Haney, sectioned with the Mayo scissors and sutured with a Heaney stitch of Vicryl 0.  This pedicle is capped on a hemostat.  We proceed the same way on the right side.  We take one additional bite with a curved Heaney on each side, sectioned with the Mayo scissors and sutured with a Vicryl 0.  We are then able to open the anterior visceral peritoneum and inserted the retractor at that level to protect the bladder.  We then clamped the left uterine artery with  a curved Heaney, we sectioned with Mayo scissors and sutured with a Vicryl 0. We proceed the same way on the right side.  We take one additional bite with a curved Heaney on each side sectioned with Mayo scissors and sutured with a Vicryl 0.  We then clamped the left utero-ovarian ligament, sectioned with Mayo scissors and sutured with a Vicryl 0.  We proceed the same way on the right side. We are then able to grasp the uterine fundus posteriorly with a tenaculum and invert the uterus to access the cornua more easily.  We clamped the left tube and round ligament with 2 curved Heaney a meeting in the middle, sectioned with Mayo scissors and sutured with 0 Vicryl 0 on each pedicle.  We proceeded the same way on the right side.  The uterus was completely detached with both proximal tubes and the cervix and sent to pathology.  Both ovaries were normal in size and appearance.  The patient was status post bilateral tubal ligation and the distal tubes were too high to be easily accessed and were therefore left in place.  Hemostasis was verified on all pedicles and was adequate.  Hemostasis of the posterior vaginal mucosa at the cuff was completed with a locked running suture of Vicryl 0. A suspension procedure was done including the left anterior vaginal cuff, the left uterosacral ligament and the left posterior vaginal cuff with a Vicryl 0.  We proceeded the same way at the right angle of the vagina.  We then closed the vaginal cuff with figure of 8's of Vicryl 0 including  the anterior and the posterior visceral peritoneum.  Hemostasis was adequate.  All instruments were removed.  The patient was brought to recovery room in good and stable status.  ESTIMATED BLOOD LOSS: 50 cc   Intake/Output Summary (Last 24 hours) at 03/11/17 0945 Last data filed at 03/11/17 0930  Gross per 24 hour  Intake             2000 ml  Output              100 ml  Net             1900 ml     BLOOD ADMINISTERED:none   LOCAL  MEDICATIONS USED:  LIDOCAINE with Epinephrine  SPECIMEN:  Source of Specimen:  Uterus with cervix, proximal tubes  DISPOSITION OF SPECIMEN:  PATHOLOGY  COUNTS:  YES  PLAN OF CARE: Transfer to PACU  Marie-Lyne LavoieMD9:45 AM

## 2017-03-11 NOTE — H&P (Addendum)
Kendra Hall is an 51 y.o. female. [id number]   RP:  Preop Persistent Menorrhagia for TVH.  HPI: S/P Endometrial Ablation with HerOption on 07/25/2016.  Since then, recurrence of heavy vaginal bleeding. Started on Megace 40 mg BID which stopped the bleeding. Seen by Dr Toney Rakes 12/16/2016. Cervical polyp removed and EBx done again. Had a VasoVagal reaction and lost consciousness briefly.  EBx was benign.  Patient requests a Hysterectomy.    Pertinent Gynecological History: Menses: Post Endometrial Ablation with menorrhagia Contraception: tubal ligation Transfusion: none Sexually transmitted diseases: no past history Previous GYN Procedures: Endometrial Ablation/Tubal Ligation  Last mammogram: normal  Last pap: normal   Menstrual History: No LMP recorded. Patient has had an ablation.  Menorrhagia.    Past Medical History:  Diagnosis Date  . Allergy   . Anemia    history of  . Anxiety   . Asthma   . Depression   . Diabetes mellitus   . Fibromyalgia   . Gastric ulcer   . GERD (gastroesophageal reflux disease)   . Grave's disease   . Graves disease   . H/O therapeutic radiation   . Migraine   . Multiple thyroid nodules   . Myofacial muscle pain   . Osteoarthritis    Facet hypertrophy with injections  . TIA (transient ischemic attack)    02/2015    Past Surgical History:  Procedure Laterality Date  . AXILLARY LYMPH NODE DISSECTION  2001  . HYDRADENITIS EXCISION    . TUBAL LIGATION  1995    Family History  Problem Relation Age of Onset  . Hypertension Mother   . Diabetes Mother   . Cancer Mother        rectal  . Heart disease Father 29       MI x5  . Hypertension Father   . Diabetes Father   . Cancer Sister 72       breast  . Drug abuse Brother   . Alcohol abuse Brother   . Drug abuse Brother   . Alcohol abuse Brother   . Anxiety disorder Neg Hx   . Bipolar disorder Neg Hx   . Depression Neg Hx     Social History:  reports that she has been smoking  Cigarettes.  She has a 15.00 pack-year smoking history. She has never used smokeless tobacco. She reports that she does not drink alcohol or use drugs.  Allergies:  Allergies  Allergen Reactions  . Ergotrate [Ergonovine] Other (See Comments)    Pt does not ever want  . Ibuprofen Other (See Comments)    Patient has ulcer  . Metformin And Related Other (See Comments)    Dizziness  . Yutopar [Ritodrine] Other (See Comments)    Pt does not ever want    Prescriptions Prior to Admission  Medication Sig Dispense Refill Last Dose  . albuterol (PROAIR HFA) 108 (90 Base) MCG/ACT inhaler Inhale 2 puffs into the lungs every 4 (four) hours as needed for wheezing. 1 Inhaler 5 Past Month at Unknown time  . benzonatate (TESSALON) 100 MG capsule Take 100 mg by mouth 2 (two) times daily as needed for cough.   03/10/2017 at Unknown time  . canagliflozin (INVOKANA) 300 MG TABS tablet Take 1 tablet (300 mg total) by mouth daily before breakfast. 30 tablet 4 03/10/2017 at Unknown time  . Dulaglutide (TRULICITY) 1.5 PZ/0.2HE SOPN Inject 1.5 mg weekly. 4 pen 11 Past Week at Unknown time  . escitalopram (LEXAPRO) 20 MG tablet TAKE 1  TABLET(20 MG) BY MOUTH DAILY 90 tablet 3 03/10/2017 at Unknown time  . fluconazole (DIFLUCAN) 150 MG tablet Take 1 tablet (150 mg total) by mouth daily. 3 tablet 0 Past Week at Unknown time  . levothyroxine (SYNTHROID, LEVOTHROID) 125 MCG tablet Take 1 tablet (125 mcg total) by mouth daily. 60 tablet 2 03/10/2017 at Unknown time  . linaclotide (LINZESS) 145 MCG CAPS capsule Take 145 mcg by mouth daily before breakfast.   03/10/2017 at Unknown time  . liraglutide (VICTOZA) 18 MG/3ML SOPN Inject 0.2 mLs (1.2 mg total) into the skin daily. 9 mL 3 03/10/2017 at Unknown time  . naproxen (NAPROSYN) 500 MG tablet TAKE 1 TABLET(500 MG) BY MOUTH EVERY 12 HOURS AS NEEDED 180 tablet 2 Past Week at Unknown time  . nortriptyline (PAMELOR) 25 MG capsule TAKE 3 CAPSULES(75 MG) BY MOUTH AT BEDTIME (Patient  taking differently: TAKE 3 CAPSULES(75 MG) BY MOUTH AT BEDTIME AS NEEDED FOR SLEEP) 270 capsule 3 Past Week at Unknown time  . ondansetron (ZOFRAN) 4 MG tablet Take 1 tablet (4 mg total) by mouth every 8 (eight) hours as needed for nausea or vomiting. 20 tablet 2 Past Month at Unknown time  . Polyethyl Glycol-Propyl Glycol (SYSTANE OP) Apply 1-2 drops to eye daily as needed (dryness).   Past Week at Unknown time  . repaglinide (PRANDIN) 0.5 MG tablet Take 1 tablet (0.5 mg total) by mouth 3 (three) times daily before meals. 90 tablet 11 03/10/2017 at Unknown time  . rizatriptan (MAXALT-MLT) 10 MG disintegrating tablet Take 1 tablet earliest onset of headache.  May repeat once in 2 hours if needed 9 tablet 11 Past Month at Unknown time  . Topiramate ER (TROKENDI XR) 100 MG CP24 Take 1 capsule by mouth at bedtime.   03/10/2017 at Unknown time  . atenolol (TENORMIN) 50 MG tablet Take 1 tablet (50 mg total) by mouth daily. (Patient not taking: Reported on 03/04/2017) 30 tablet 3 Not Taking at Unknown time  . Blood Glucose Calibration (ONETOUCH VERIO) SOLN Used to check meter accuracy 1 each 0   . glucose blood (ONETOUCH VERIO) test strip 1 each by Other route 4 (four) times daily. And lancets 4/day 450 each 12 Taking  . megestrol (MEGACE) 40 MG tablet Take 1 tablet (40 mg total) by mouth 2 (two) times daily. (Patient not taking: Reported on 03/04/2017) 20 tablet 1 Not Taking at Unknown time    ROS Neg  Blood pressure 129/69, pulse 80, temperature 98.1 F (36.7 C), temperature source Oral, resp. rate 20, SpO2 98 %, unknown if currently breastfeeding. Physical Exam  Results for orders placed or performed during the hospital encounter of 03/11/17 (from the past 24 hour(s))  Pregnancy, urine     Status: None   Collection Time: 03/11/17  6:00 AM  Result Value Ref Range   Preg Test, Ur NEGATIVE NEGATIVE   Ultrasound sonohysterogram today: Uterus 9.1 x 6.1 x 4.9 cm with an endometrial stripe of 7 mm. Small  intramural fibroid 8 x 8 mm. Right thick walled follicle 8 mm was noted left ovary was normal. No fluid in the cul-de-sac. The cervix is then cleansed with Betadine solution a sterile catheter was introduced into the uterine cavity and normal saline was instilled and no intracavitary defect was noted.   Assessment/Plan:  1. Menorrhagia with regular cycle S/P Endometrial Ablation.  EBx benign.  Uterine Fibroid.  SVD x 4.  Decision to proceed with Total Vaginal Hysterectomy.    2. S/P endometrial ablation  Patient was counseled as to the risk of surgery to include the following:  1. Infection (prohylactic antibiotics will be administered)  2. DVT/Pulmonary Embolism (prophylactic pneumo compression stockings will be used)  3.Trauma to internal organs requiring additional surgical procedure to repair any injury to     Internal organs requiring perhaps additional hospitalization days.  4.Hemmorhage requiring transfusion and blood products which carry risks such as anaphylactic reaction, hepatitis and AIDS  Patient had received literature information on the procedure scheduled and all her questions were answered and fully accepts all risk.    Marie-Lyne Javonta Gronau 03/11/2017, 6:55 AM

## 2017-03-11 NOTE — Anesthesia Preprocedure Evaluation (Signed)
Anesthesia Evaluation  Patient identified by MRN, date of birth, ID band Patient awake    Reviewed: Allergy & Precautions, NPO status , Patient's Chart, lab work & pertinent test results  Airway Mallampati: I       Dental no notable dental hx. (+) Poor Dentition, Missing   Pulmonary Current Smoker,    Pulmonary exam normal breath sounds clear to auscultation       Cardiovascular Normal cardiovascular exam Rhythm:Regular Rate:Normal     Neuro/Psych    GI/Hepatic   Endo/Other  diabetes, Type 2, Oral Hypoglycemic Agents  Renal/GU      Musculoskeletal   Abdominal (+) + obese,   Peds  Hematology   Anesthesia Other Findings   Reproductive/Obstetrics                             Anesthesia Physical Anesthesia Plan  ASA: II  Anesthesia Plan: General   Post-op Pain Management:    Induction: Intravenous  PONV Risk Score and Plan: 4 or greater and Ondansetron, Dexamethasone, Midazolam and Scopolamine patch - Pre-op  Airway Management Planned: Oral ETT  Additional Equipment:   Intra-op Plan:   Post-operative Plan: Extubation in OR  Informed Consent: I have reviewed the patients History and Physical, chart, labs and discussed the procedure including the risks, benefits and alternatives for the proposed anesthesia with the patient or authorized representative who has indicated his/her understanding and acceptance.   Dental advisory given  Plan Discussed with: CRNA and Surgeon  Anesthesia Plan Comments:         Anesthesia Quick Evaluation

## 2017-03-12 ENCOUNTER — Telehealth: Payer: Self-pay

## 2017-03-12 ENCOUNTER — Encounter (HOSPITAL_COMMUNITY): Payer: Self-pay | Admitting: Obstetrics & Gynecology

## 2017-03-12 DIAGNOSIS — N920 Excessive and frequent menstruation with regular cycle: Secondary | ICD-10-CM | POA: Diagnosis not present

## 2017-03-12 DIAGNOSIS — Z0289 Encounter for other administrative examinations: Secondary | ICD-10-CM

## 2017-03-12 LAB — CBC
HCT: 36.8 % (ref 36.0–46.0)
Hemoglobin: 12 g/dL (ref 12.0–15.0)
MCH: 27.6 pg (ref 26.0–34.0)
MCHC: 32.6 g/dL (ref 30.0–36.0)
MCV: 84.8 fL (ref 78.0–100.0)
Platelets: 263 10*3/uL (ref 150–400)
RBC: 4.34 MIL/uL (ref 3.87–5.11)
RDW: 14.2 % (ref 11.5–15.5)
WBC: 11.6 10*3/uL — ABNORMAL HIGH (ref 4.0–10.5)

## 2017-03-12 MED ORDER — BENZONATATE 100 MG PO CAPS: 100.0000 mg | ORAL_CAPSULE | Freq: Two times a day (BID) | ORAL | 0 refills | Status: DC | PRN

## 2017-03-12 MED ORDER — OXYCODONE-ACETAMINOPHEN 7.5-325 MG PO TABS: 1.0000 | ORAL_TABLET | Freq: Four times a day (QID) | ORAL | 0 refills | Status: DC | PRN

## 2017-03-12 NOTE — Progress Notes (Signed)
Pt. Ready for d/c. Home care discussed and understood. All post op goals met.   Home meds returned to patient: Trokendi XR, Invokana, Victoza, Repaglinide.  Pt. In agreement with return meds/count 

## 2017-03-12 NOTE — Discharge Summary (Signed)
Physician Discharge Summary  Patient ID: Kendra Hall MRN: [id number] DOB/AGE: 1966/05/30 51 y.o.  Admit date: 03/11/2017 Discharge date: 03/12/2017  Admission Diagnoses: menometrorrhagia w polyps, submucous myoma, failed endometrial ablation   Discharge Diagnoses:  Active Problems:   Postoperative state   Discharged Condition: good  Consults:None  Significant Diagnostic Studies: None  Treatments:surgery: Vaginal Hysterectomy  Vitals:   03/11/17 2330 03/12/17 0430  BP: 118/71 127/69  Pulse: 96 82  Resp: 18 18  Temp: 99 F (37.2 C) 98.6 F (37 C)  SpO2: 100% 100%     No intake/output data recorded.  Discharge Exam: Normal postop  Disposition: 01-Home or Self Care     Allergies as of 03/12/2017      Reactions   Ergotrate [ergonovine] Other (See Comments)   Pt does not ever want   Ibuprofen Other (See Comments)   Patient has ulcer   Metformin And Related Other (See Comments)   Dizziness   Yutopar [ritodrine] Other (See Comments)   Pt does not ever want      Medication List    TAKE these medications   albuterol 108 (90 Base) MCG/ACT inhaler Commonly known as:  PROAIR HFA Inhale 2 puffs into the lungs every 4 (four) hours as needed for wheezing.   atenolol 50 MG tablet Commonly known as:  TENORMIN Take 1 tablet (50 mg total) by mouth daily.   benzonatate 100 MG capsule Commonly known as:  TESSALON Take 100 mg by mouth 2 (two) times daily as needed for cough. What changed:  Another medication with the same name was added. Make sure you understand how and when to take each.   benzonatate 100 MG capsule Commonly known as:  TESSALON Take 1 capsule (100 mg total) by mouth 2 (two) times daily as needed for cough. What changed:  You were already taking a medication with the same name, and this prescription was added. Make sure you understand how and when to take each.   canagliflozin 300 MG Tabs tablet Commonly known as:  INVOKANA Take 1 tablet (300 mg  total) by mouth daily before breakfast.   Dulaglutide 1.5 MG/0.5ML Sopn Commonly known as:  TRULICITY Inject 1.5 mg weekly.   escitalopram 20 MG tablet Commonly known as:  LEXAPRO TAKE 1 TABLET(20 MG) BY MOUTH DAILY   fluconazole 150 MG tablet Commonly known as:  DIFLUCAN Take 1 tablet (150 mg total) by mouth daily.   glucose blood test strip Commonly known as:  ONETOUCH VERIO 1 each by Other route 4 (four) times daily. And lancets 4/day   levothyroxine 125 MCG tablet Commonly known as:  SYNTHROID, LEVOTHROID Take 1 tablet (125 mcg total) by mouth daily.   LINZESS 145 MCG Caps capsule Generic drug:  linaclotide Take 145 mcg by mouth daily before breakfast.   liraglutide 18 MG/3ML Sopn Commonly known as:  VICTOZA Inject 0.2 mLs (1.2 mg total) into the skin daily.   megestrol 40 MG tablet Commonly known as:  MEGACE Take 1 tablet (40 mg total) by mouth 2 (two) times daily.   naproxen 500 MG tablet Commonly known as:  NAPROSYN TAKE 1 TABLET(500 MG) BY MOUTH EVERY 12 HOURS AS NEEDED   nortriptyline 25 MG capsule Commonly known as:  PAMELOR TAKE 3 CAPSULES(75 MG) BY MOUTH AT BEDTIME What changed:  See the new instructions.   ondansetron 4 MG tablet Commonly known as:  ZOFRAN Take 1 tablet (4 mg total) by mouth every 8 (eight) hours as needed for nausea or vomiting.  ONETOUCH VERIO Soln Used to check meter accuracy   oxyCODONE-acetaminophen 7.5-325 MG tablet Commonly known as:  PERCOCET Take 1 tablet by mouth every 6 (six) hours as needed for severe pain.   repaglinide 0.5 MG tablet Commonly known as:  PRANDIN Take 1 tablet (0.5 mg total) by mouth 3 (three) times daily before meals.   rizatriptan 10 MG disintegrating tablet Commonly known as:  MAXALT-MLT Take 1 tablet earliest onset of headache.  May repeat once in 2 hours if needed   SYSTANE OP Apply 1-2 drops to eye daily as needed (dryness).   TROKENDI XR 100 MG Cp24 Generic drug:  Topiramate ER Take 1  capsule by mouth at bedtime.            Discharge Care Instructions        Start     Ordered   03/12/17 0000  benzonatate (TESSALON) 100 MG capsule  2 times daily PRN     03/12/17 0825   03/12/17 0000  oxyCODONE-acetaminophen (PERCOCET) 7.5-325 MG tablet  Every 6 hours PRN     03/12/17 0825       Follow-up Information    Princess Bruins, MD Follow up in 3 week(s).   Specialty:  Obstetrics and Gynecology Contact information: [address] [place] [address] [phone]            Signed: Princess Bruins 03/12/2017, 8:26 AM

## 2017-03-12 NOTE — Discharge Instructions (Signed)
Vaginal Hysterectomy, Care After °Refer to this sheet in the next few weeks. These instructions provide you with information about caring for yourself after your procedure. Your health care provider may also give you more specific instructions. Your treatment has been planned according to current medical practices, but problems sometimes occur. Call your health care provider if you have any problems or questions after your procedure. °What can I expect after the procedure? °After the procedure, it is common to have: °· Pain. °· Soreness and numbness in your incision areas. °· Vaginal bleeding and discharge. °· Constipation. °· Temporary problems emptying the bladder. °· Feelings of sadness or other emotions. ° °Follow these instructions at home: °Medicines °· Take over-the-counter and prescription medicines only as told by your health care provider. °· If you were prescribed an antibiotic medicine, take it as told by your health care provider. Do not stop taking the antibiotic even if you start to feel better. °· Do not drive or operate heavy machinery while taking prescription pain medicine. °Activity °· Return to your normal activities as told by your health care provider. Ask your health care provider what activities are safe for you. °· Get regular exercise as told by your health care provider. You may be told to take short walks every day and go farther each time. °· Do not lift anything that is heavier than 10 lb (4.5 kg). °General instructions ° °· Do not put anything in your vagina for 6 weeks after your surgery or as told by your health care provider. This includes tampons and douches. °· Do not have sex until your health care provider says you can. °· Do not take baths, swim, or use a hot tub until your health care provider approves. °· Drink enough fluid to keep your urine clear or pale yellow. °· Do not drive for 24 hours if you were given a sedative. °· Keep all follow-up visits as told by your health  care provider. This is important. °Contact a health care provider if: °· Your pain medicine is not helping. °· You have a fever. °· You have redness, swelling, or pain at your incision site. °· You have blood, pus, or a bad-smelling discharge from your vagina. °· You continue to have difficulty urinating. °Get help right away if: °· You have severe abdominal or back pain. °· You have heavy bleeding from your vagina. °· You have chest pain or shortness of breath. °This information is not intended to replace advice given to you by your health care provider. Make sure you discuss any questions you have with your health care provider. °Document Released: 10/02/2015 Document Revised: 11/16/2015 Document Reviewed: 06/25/2015 °Elsevier Interactive Patient Education © 2018 [employer] ° °

## 2017-03-12 NOTE — Telephone Encounter (Signed)
I called patient because I received a disability via fax for her. I called her because I will need a completed and signed medical records release form from her giving me permission to release info about her to this company.  We will email the form to her and she will email it back. She paid her fee today for the form.

## 2017-03-12 NOTE — Progress Notes (Signed)
POD#1  Subjective: Patient reports tolerating PO, + flatus and no problems voiding.    Objective: I have reviewed patient's vital signs.  vital signs, intake and output, medications and labs.  Vitals:   03/11/17 2330 03/12/17 0430  BP: 118/71 127/69  Pulse: 96 82  Resp: 18 18  Temp: 99 F (37.2 C) 98.6 F (37 C)  SpO2: 100% 100%   I/O last 3 completed shifts: In: 4441.7 [P.O.:1200; I.V.:3241.7] Out: 1325 [Urine:1275; Blood:50] No intake/output data recorded.  Results for orders placed or performed during the hospital encounter of 03/11/17 (from the past 24 hour(s))  Glucose, capillary     Status: Abnormal   Collection Time: 03/11/17  9:40 AM  Result Value Ref Range   Glucose-Capillary 146 (H) 65 - 99 mg/dL  Glucose, capillary     Status: Abnormal   Collection Time: 03/11/17  6:08 PM  Result Value Ref Range   Glucose-Capillary 140 (H) 65 - 99 mg/dL  CBC     Status: Abnormal   Collection Time: 03/12/17  5:13 AM  Result Value Ref Range   WBC 11.6 (H) 4.0 - 10.5 K/uL   RBC 4.34 3.87 - 5.11 MIL/uL   Hemoglobin 12.0 12.0 - 15.0 g/dL   HCT 36.8 36.0 - 46.0 %   MCV 84.8 78.0 - 100.0 fL   MCH 27.6 26.0 - 34.0 pg   MCHC 32.6 30.0 - 36.0 g/dL   RDW 14.2 11.5 - 15.5 %   Platelets 263 150 - 400 K/uL    EXAM General: alert and cooperative Resp: clear to auscultation bilaterally Cardio: regular rate and rhythm GI: soft, non-tender; bowel sounds normal; no masses,  no organomegaly and incision: clean, dry and intact Extremities: no edema, redness or tenderness in the calves or thighs Vaginal Bleeding: minimal  Assessment: s/p Procedure(s): HYSTERECTOMY VAGINAL W/BILATERAL PROXIMAL SALPINGECTOMY: stable, progressing well and tolerating diet  Plan: Discharge home  LOS: 0 days    Princess Bruins, MD 03/12/2017 8:06 AM

## 2017-03-13 ENCOUNTER — Telehealth: Payer: Self-pay | Admitting: *Deleted

## 2017-03-13 NOTE — Telephone Encounter (Signed)
Pt post vaginal hysterectomy on 03/11/17 c/o very sore throat, dry mouth, and hurts to swallow, pt taking tessalon 100 mg capsule, pt is not having any coughing. Also mentioned not having a bowel movement yet, and slight burning with urination but not a lot. I did explained not abnormal to have soreness in throat after surgery, but I would run all this by you for any recommendations. Please advise

## 2017-03-13 NOTE — Telephone Encounter (Signed)
Pt informed

## 2017-03-13 NOTE — Telephone Encounter (Signed)
It all sounds wnl to me.  Recommend pushing water intake and diet for constipation ie prunes, grapes, fibers.  Stool softener like Colace OTC.

## 2017-03-18 ENCOUNTER — Telehealth: Payer: Self-pay

## 2017-03-18 ENCOUNTER — Encounter: Payer: Self-pay | Admitting: Anesthesiology

## 2017-03-18 NOTE — Telephone Encounter (Signed)
I called last week at Dr. Assunta Curtis request to the [facility] Help Desk to ask correcting the description for patient's discharge encounter.  I spoke with several people. Lastly, on Friday was Lauren in IT [phone]). Today Drusilla Kanner to tell me that they have not been able to figure out the correction yet but did want me to know that they continue to work on it.

## 2017-03-19 NOTE — Progress Notes (Signed)
CSW spoke with patient via telephone to provide counseling resources.  Patient appreciative.

## 2017-03-31 NOTE — Telephone Encounter (Signed)
I called Lauren in IT to follow up on this (SLHTDS #[id number]).  Still has not been resolved. She said "Domingo Mend has it now".  She had made a note that Lauren did not understand. Lauren said she will make a note on it that I would like follow-up.  I told her Dr. Dellis Filbert would like to have seen this removed ASAP.

## 2017-04-03 ENCOUNTER — Encounter: Payer: Self-pay | Admitting: Obstetrics & Gynecology

## 2017-04-03 ENCOUNTER — Ambulatory Visit (INDEPENDENT_AMBULATORY_CARE_PROVIDER_SITE_OTHER): Payer: 59 | Admitting: Obstetrics & Gynecology

## 2017-04-03 VITALS — BP 138/70

## 2017-04-03 DIAGNOSIS — N898 Other specified noninflammatory disorders of vagina: Secondary | ICD-10-CM

## 2017-04-03 DIAGNOSIS — Z09 Encounter for follow-up examination after completed treatment for conditions other than malignant neoplasm: Secondary | ICD-10-CM

## 2017-04-03 DIAGNOSIS — R300 Dysuria: Secondary | ICD-10-CM

## 2017-04-03 LAB — WET PREP FOR TRICH, YEAST, CLUE

## 2017-04-03 MED ORDER — TINIDAZOLE 500 MG PO TABS: 2.0000 g | ORAL_TABLET | Freq: Every day | ORAL | 0 refills | Status: AC

## 2017-04-03 MED ORDER — FLUCONAZOLE 150 MG PO TABS: 150.0000 mg | ORAL_TABLET | Freq: Every day | ORAL | 2 refills | Status: AC

## 2017-04-03 NOTE — Progress Notes (Signed)
    Kendra Hall 1965-12-05 [id number]        51 y.o.  [id number]   RP:  Post op 3 weeks TVH 03/11/2017  HPI:   Very good post op progression with no abdominopelvic pain, but some vaginal discharge and pain with urination.  No vaginal bleeding.  No fever.  Past medical history,surgical history, problem list, medications, allergies, family history and social history were all reviewed and documented in the EPIC chart.  Directed ROS with pertinent positives and negatives documented in the history of present illness/assessment and plan.  Exam:  Vitals:   04/03/17 1236  BP: 138/70   General appearance:  Normal  Abdomen:  Soft, not distended, no mass, NT  Gyn exam:  Vulva normal.  Speculum:  Increased vaginal d/c.  Wet prep done.  Vaginal vault healing well.   Assessment/Plan:  51 y.o. [id number]   1. Follow-up examination after gynecological surgery Healing very well, no complication from surgery.  Will f/u in 4 weeks for final assessment of the vaginal vault.  Can increase physical activity, but nothing in vagina, no sexual activity.  2. Vaginal discharge Bacterial Vaginosis and Yeast vaginitis positive on wet prep.  Will treat with Tinidazole first.  Usage reviewed.  Then with Fluconazole 150 mg PO daily x 3 days.  Probiotic tablet vaginally every week for prevention.  3. Dysuria U/A wnl.  Will wait on U. Culture.  Counseling on above issues >50% x 15 minutes.  Princess Bruins MD, 12:54 PM 04/03/2017

## 2017-04-05 LAB — URINALYSIS W MICROSCOPIC + REFLEX CULTURE
Bilirubin Urine: NEGATIVE
Hyaline Cast: NONE SEEN /LPF
Nitrites, Initial: NEGATIVE
Specific Gravity, Urine: 1.025 (ref 1.001–1.03)
pH: 5 (ref 5.0–8.0)

## 2017-04-05 LAB — URINE CULTURE
MICRO NUMBER:: 81139760
SPECIMEN QUALITY:: ADEQUATE

## 2017-04-05 LAB — CULTURE INDICATED

## 2017-04-05 NOTE — Patient Instructions (Signed)
1. Follow-up examination after gynecological surgery Healing very well, no complication from surgery.  Will f/u in 4 weeks for final assessment of the vaginal vault.  Can increase physical activity, but nothing in vagina, no sexual activity.  2. Vaginal discharge Bacterial Vaginosis and Yeast vaginitis positive on wet prep.  Will treat with Tinidazole first.  Usage reviewed.  Then with Fluconazole 150 mg PO daily x 3 days.  Probiotic tablet vaginally every week for prevention.  3. Dysuria U/A wnl.  Will wait on U. Culture.  Kendra Hall, it was a pleasure to see you today!  See you again in 4 weeks.   Bacterial Vaginosis Bacterial vaginosis is a vaginal infection that occurs when the normal balance of bacteria in the vagina is disrupted. It results from an overgrowth of certain bacteria. This is the most common vaginal infection among women ages 67-44. Because bacterial vaginosis increases your risk for STIs (sexually transmitted infections), getting treated can help reduce your risk for chlamydia, gonorrhea, herpes, and HIV (human immunodeficiency virus). Treatment is also important for preventing complications in pregnant women, because this condition can cause an early (premature) delivery. What are the causes? This condition is caused by an increase in harmful bacteria that are normally present in small amounts in the vagina. However, the reason that the condition develops is not fully understood. What increases the risk? The following factors may make you more likely to develop this condition:  Having a new sexual partner or multiple sexual partners.  Having unprotected sex.  Douching.  Having an intrauterine device (IUD).  Smoking.  Drug and alcohol abuse.  Taking certain antibiotic medicines.  Being pregnant.  You cannot get bacterial vaginosis from toilet seats, bedding, swimming pools, or contact with objects around you. What are the signs or symptoms? Symptoms of this  condition include:  Grey or white vaginal discharge. The discharge can also be watery or foamy.  A fish-like odor with discharge, especially after sexual intercourse or during menstruation.  Itching in and around the vagina.  Burning or pain with urination.  Some women with bacterial vaginosis have no signs or symptoms. How is this diagnosed? This condition is diagnosed based on:  Your medical history.  A physical exam of the vagina.  Testing a sample of vaginal fluid under a microscope to look for a large amount of bad bacteria or abnormal cells. Your health care provider may use a cotton swab or a small wooden spatula to collect the sample.  How is this treated? This condition is treated with antibiotics. These may be given as a pill, a vaginal cream, or a medicine that is put into the vagina (suppository). If the condition comes back after treatment, a second round of antibiotics may be needed. Follow these instructions at home: Medicines  Take over-the-counter and prescription medicines only as told by your health care provider.  Take or use your antibiotic as told by your health care provider. Do not stop taking or using the antibiotic even if you start to feel better. General instructions  If you have a female sexual partner, tell her that you have a vaginal infection. She should see her health care provider and be treated if she has symptoms. If you have a female sexual partner, he does not need treatment.  During treatment: ? Avoid sexual activity until you finish treatment. ? Do not douche. ? Avoid alcohol as directed by your health care provider. ? Avoid breastfeeding as directed by your health care provider.  Drink  enough water and fluids to keep your urine clear or pale yellow.  Keep the area around your vagina and rectum clean. ? Wash the area daily with warm water. ? Wipe yourself from front to back after using the toilet.  Keep all follow-up visits as told by  your health care provider. This is important. How is this prevented?  Do not douche.  Wash the outside of your vagina with warm water only.  Use protection when having sex. This includes latex condoms and dental dams.  Limit how many sexual partners you have. To help prevent bacterial vaginosis, it is best to have sex with just one partner (monogamous).  Make sure you and your sexual partner are tested for STIs.  Wear cotton or cotton-lined underwear.  Avoid wearing tight pants and pantyhose, especially during summer.  Limit the amount of alcohol that you drink.  Do not use any products that contain nicotine or tobacco, such as cigarettes and e-cigarettes. If you need help quitting, ask your health care provider.  Do not use illegal drugs. Where to find more information:  Centers for Disease Control and Prevention: AppraiserFraud.fi  American Sexual Health Association (ASHA): www.ashastd.org  U.S. Department of Health and Financial controller, Office on Women's Health: DustingSprays.pl or SecuritiesCard.it Contact a health care provider if:  Your symptoms do not improve, even after treatment.  You have more discharge or pain when urinating.  You have a fever.  You have pain in your abdomen.  You have pain during sex.  You have vaginal bleeding between periods. Summary  Bacterial vaginosis is a vaginal infection that occurs when the normal balance of bacteria in the vagina is disrupted.  Because bacterial vaginosis increases your risk for STIs (sexually transmitted infections), getting treated can help reduce your risk for chlamydia, gonorrhea, herpes, and HIV (human immunodeficiency virus). Treatment is also important for preventing complications in pregnant women, because the condition can cause an early (premature) delivery.  This condition is treated with antibiotic medicines. These may be given as a pill, a vaginal cream, or a  medicine that is put into the vagina (suppository). This information is not intended to replace advice given to you by your health care provider. Make sure you discuss any questions you have with your health care provider. Document Released: 06/10/2005 Document Revised: 02/24/2016 Document Reviewed: 02/24/2016 Elsevier Interactive Patient Education  2017 Elsevier Inc.  Vaginal Yeast infection, Adult Vaginal yeast infection is a condition that causes soreness, swelling, and redness (inflammation) of the vagina. It also causes vaginal discharge. This is a common condition. Some women get this infection frequently. What are the causes? This condition is caused by a change in the normal balance of the yeast (candida) and bacteria that live in the vagina. This change causes an overgrowth of yeast, which causes the inflammation. What increases the risk? This condition is more likely to develop in:  Women who take antibiotic medicines.  Women who have diabetes.  Women who take birth control pills.  Women who are pregnant.  Women who douche often.  Women who have a weak defense (immune) system.  Women who have been taking steroid medicines for a long time.  Women who frequently wear tight clothing.  What are the signs or symptoms? Symptoms of this condition include:  White, thick vaginal discharge.  Swelling, itching, redness, and irritation of the vagina. The lips of the vagina (vulva) may be affected as well.  Pain or a burning feeling while urinating.  Pain during sex.  How is this diagnosed? This condition is diagnosed with a medical history and physical exam. This will include a pelvic exam. Your health care provider will examine a sample of your vaginal discharge under a microscope. Your health care provider may send this sample for testing to confirm the diagnosis. How is this treated? This condition is treated with medicine. Medicines may be over-the-counter or  prescription. You may be told to use one or more of the following:  Medicine that is taken orally.  Medicine that is applied as a cream.  Medicine that is inserted directly into the vagina (suppository).  Follow these instructions at home:  Take or apply over-the-counter and prescription medicines only as told by your health care provider.  Do not have sex until your health care provider has approved. Tell your sex partner that you have a yeast infection. That person should go to his or her health care provider if he or she develops symptoms.  Do not wear tight clothes, such as pantyhose or tight pants.  Avoid using tampons until your health care provider approves.  Eat more yogurt. This may help to keep your yeast infection from returning.  Try taking a sitz bath to help with discomfort. This is a warm water bath that is taken while you are sitting down. The water should only come up to your hips and should cover your buttocks. Do this 3-4 times per day or as told by your health care provider.  Do not douche.  Wear breathable, cotton underwear.  If you have diabetes, keep your blood sugar levels under control. Contact a health care provider if:  You have a fever.  Your symptoms go away and then return.  Your symptoms do not get better with treatment.  Your symptoms get worse.  You have new symptoms.  You develop blisters in or around your vagina.  You have blood coming from your vagina and it is not your menstrual period.  You develop pain in your abdomen. This information is not intended to replace advice given to you by your health care provider. Make sure you discuss any questions you have with your health care provider. Document Released: 03/20/2005 Document Revised: 11/22/2015 Document Reviewed: 12/12/2014 Elsevier Interactive Patient Education  2018 [employer].

## 2017-04-15 ENCOUNTER — Ambulatory Visit (INDEPENDENT_AMBULATORY_CARE_PROVIDER_SITE_OTHER): Payer: 59 | Admitting: Endocrinology

## 2017-04-15 VITALS — BP 128/76 | HR 73 | Temp 98.1°F | Wt 185.0 lb

## 2017-04-15 DIAGNOSIS — E119 Type 2 diabetes mellitus without complications: Secondary | ICD-10-CM | POA: Diagnosis not present

## 2017-04-15 NOTE — Progress Notes (Signed)
Pre visit review using our clinic review tool, if applicable. No additional management support is needed unless otherwise documented below in the visit note. 

## 2017-04-15 NOTE — Progress Notes (Signed)
Subjective:    Patient ID: Kendra Hall, female    DOB: 1966/01/13, 51 y.o.   MRN: [id number]  HPI Pt returns for f/u of diabetes mellitus:  DM type: Insulin-requiring type 2.  Dx'ed: 8315 Complications: none Therapy: trulicity, victoza, 2 oral meds.  GDM: 1984.   DKA: never.   Severe hypoglycemia: once, in 2018.  Pancreatitis: never.  Other: she takes multiple daily injections; she takes both trulicity and victoza, and she wants to continue; she took insulin 2012-2018 Interval history:  Since off insulin, she says cbg's vary from 78-140.  There is no trend throughout the day. She says she often skips the victoza, due to cbg below 100.   Past Medical History:  Diagnosis Date  . Allergy   . Anemia    history of  . Anxiety   . Asthma   . Depression   . Diabetes mellitus   . Fibromyalgia   . Gastric ulcer   . GERD (gastroesophageal reflux disease)   . Grave's disease   . Graves disease   . H/O therapeutic radiation   . Migraine   . Multiple thyroid nodules   . Myofacial muscle pain   . Osteoarthritis    Facet hypertrophy with injections  . TIA (transient ischemic attack)    02/2015    Past Surgical History:  Procedure Laterality Date  . AXILLARY LYMPH NODE DISSECTION  2001  . HYDRADENITIS EXCISION    . TUBAL LIGATION  1995  . VAGINAL HYSTERECTOMY Left 03/11/2017   Procedure: HYSTERECTOMY VAGINAL W/LEFT PROXIMAL SALPINGECTOMY;  Surgeon: Princess Bruins, MD;  Location: [facility];  Service: Gynecology;  Laterality: Left;  request 7:30am OR time  request 2 hours      Social History   Social History  . Marital status: Single    Spouse name: N/A  . Number of children: 4  . Years of education: 14   Occupational History  . 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 Smoking status: Current Every Day Smoker    Packs/day: 0.50    Years: 30.00    Types: Cigarettes  . Smokeless tobacco: Never Used  . Alcohol use No     Comment: rare  .  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  Today denies all drug abuse  . Sexual activity: Not Currently    Birth control/ protection: Surgical   Other Topics Concern  . Not on file   Social History Narrative   Lives alone in [place]. Her daugther occassoinally comes and stay with her. 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  originally from [place],  [place].  Raised by mom and her sister who is 8 yrs older than pt. Pt has several half siblings. No longer on disability. Pt has 4 kids, never married.              Current Outpatient Prescriptions on File Prior to Visit  Medication Sig Dispense Refill  . albuterol (PROAIR HFA) 108 (90 Base) MCG/ACT inhaler Inhale 2 puffs into the lungs every 4 (four) hours as needed for wheezing. 1 Inhaler 5  . benzonatate (TESSALON) 100 MG capsule Take 1 capsule (100 mg total) by mouth 2 (two) times daily as needed for cough. 20 capsule 0  . Blood Glucose Calibration (ONETOUCH  VERIO) SOLN Used to check meter accuracy 1 each 0  . canagliflozin (INVOKANA) 300 MG TABS tablet Take 1 tablet (300 mg total) by mouth daily before breakfast. 30 tablet 4  . Dulaglutide (TRULICITY) 1.5 LF/8.1OF SOPN Inject 1.5 mg weekly. 4 pen 11  . escitalopram (LEXAPRO) 20 MG tablet TAKE 1 TABLET(20 MG) BY MOUTH DAILY 90 tablet 3  . glucose blood (ONETOUCH VERIO) test strip 1 each by Other route 4 (four) times daily. And lancets 4/day 450 each 12  . levothyroxine (SYNTHROID, LEVOTHROID) 125 MCG tablet Take 1 tablet (125 mcg total) by mouth daily. 60 tablet 2  . linaclotide (LINZESS) 145 MCG CAPS capsule Take 145 mcg by mouth daily before breakfast.    . liraglutide (VICTOZA) 18 MG/3ML SOPN Inject 0.2 mLs (1.2 mg total) into the skin daily. 9 mL 3  . naproxen (NAPROSYN) 500 MG tablet TAKE 1 TABLET(500 MG) BY MOUTH EVERY 12 HOURS AS  NEEDED 180 tablet 2  . nortriptyline (PAMELOR) 25 MG capsule TAKE 3 CAPSULES(75 MG) BY MOUTH AT BEDTIME (Patient taking differently: TAKE 3 CAPSULES(75 MG) BY MOUTH AT BEDTIME AS NEEDED FOR SLEEP) 270 capsule 3  . ondansetron (ZOFRAN) 4 MG tablet Take 1 tablet (4 mg total) by mouth every 8 (eight) hours as needed for nausea or vomiting. 20 tablet 2  . oxyCODONE-acetaminophen (PERCOCET) 7.5-325 MG tablet Take 1 tablet by mouth every 6 (six) hours as needed for severe pain. 30 tablet 0  . Polyethyl Glycol-Propyl Glycol (SYSTANE OP) Apply 1-2 drops to eye daily as needed (dryness).    . repaglinide (PRANDIN) 0.5 MG tablet Take 1 tablet (0.5 mg total) by mouth 3 (three) times daily before meals. 90 tablet 11  . rizatriptan (MAXALT-MLT) 10 MG disintegrating tablet Take 1 tablet earliest onset of headache.  May repeat once in 2 hours if needed 9 tablet 11  . Topiramate ER (TROKENDI XR) 100 MG CP24 Take 1 capsule by mouth at bedtime.     No current facility-administered medications on file prior to visit.     Allergies  Allergen Reactions  . Ergotrate [Ergonovine] Other (See Comments)    Pt does not ever want  . Ibuprofen Other (See Comments)    Patient has ulcer  . Metformin And Related Other (See Comments)    Dizziness  . Yutopar [Ritodrine] Other (See Comments)    Pt does not ever want    Family History  Problem Relation Age of Onset  . Hypertension Mother   . Diabetes Mother   . Cancer Mother        rectal  . Heart disease Father 20       MI x5  . Hypertension Father   . Diabetes Father   . Cancer Sister 32       breast  . Drug abuse Brother   . Alcohol abuse Brother   . Drug abuse Brother   . Alcohol abuse Brother   . Anxiety disorder Neg Hx   . Bipolar disorder Neg Hx   . Depression Neg Hx     BP 128/76 (BP Location: Left Arm)   Pulse 73   Temp 98.1 F (36.7 C) (Oral)   Wt 185 lb (83.9 kg)   LMP 12/06/2016 (Approximate)   SpO2 98%   BMI 32.26 kg/m   Review of  Systems She denies hypoglycemia    Objective:   Physical Exam VITAL SIGNS:  See vs page GENERAL: no distress Pulses: foot pulses are intact bilaterally.  MSK: no deformity of the feet or ankles.  CV: no edema of the legs or ankles Skin:  no ulcer on the feet or ankles.  normal color and temp on the feet and ankles Neuro: sensation is intact to touch on the feet and ankles.    Lab Results  Component Value Date   CREATININE 0.98 04/15/2017   BUN 13 04/15/2017   NA 137 04/15/2017   K 4.3 04/15/2017   CL 106 04/15/2017   CO2 23 04/15/2017      Assessment & Plan:  Type 2 DM:  She can reduce meds  Patient Instructions  Please stop taking the repaglinide, and:  continue the same other diabetes medications.  check your blood sugar once a day.  vary the time of day when you check, between before the 3 meals, and at bedtime.  also check if you have symptoms of your blood sugar being too high or too low.  please keep a record of the readings and bring it to your next appointment here (or you can bring the meter itself).  You can write it on any piece of paper.  please call us sooner if your blood sugar goes below 70, or if you have a lot of readings over 200.  Please come back for a follow-up appointment in 3 months.

## 2017-04-15 NOTE — Patient Instructions (Signed)
Please stop taking the repaglinide, and:  continue the same other diabetes medications.  check your blood sugar once a day.  vary the time of day when you check, between before the 3 meals, and at bedtime.  also check if you have symptoms of your blood sugar being too high or too low.  please keep a record of the readings and bring it to your next appointment here (or you can bring the meter itself).  You can write it on any piece of paper.  please call us sooner if your blood sugar goes below 70, or if you have a lot of readings over 200.  Please come back for a follow-up appointment in 3 months.

## 2017-04-16 LAB — BASIC METABOLIC PANEL
BUN: 13 mg/dL (ref 6–23)
CO2: 23 mEq/L (ref 19–32)
Calcium: 9.3 mg/dL (ref 8.4–10.5)
Chloride: 106 mEq/L (ref 96–112)
Creatinine, Ser: 0.98 mg/dL (ref 0.40–1.20)
GFR: 76.81 mL/min (ref >60.00)
Glucose, Bld: 100 mg/dL — ABNORMAL HIGH (ref 70–99)
Potassium: 4.3 mEq/L (ref 3.5–5.1)
Sodium: 137 mEq/L (ref 135–145)

## 2017-04-17 ENCOUNTER — Telehealth: Payer: Self-pay | Admitting: *Deleted

## 2017-04-17 LAB — FRUCTOSAMINE: Fructosamine: 182 umol/L — ABNORMAL LOW (ref 190–270)

## 2017-04-17 NOTE — Telephone Encounter (Addendum)
1. Pt called to follow up from office visit on 04/03/17 completed Tindamax 500 mg #4 tablets and diflucan 150 x 3 days. Pt still have slight vaginal odor, discharge, itching.   2. Asked if Rx should be based on urine results on 04/03/17? Only notes burning before urination, no burning with urination, or frequent urination.  3. Asked when you will clear her to go back to work after her final post op visit on 04/30/17? Will need to let her job know.( I checked with Juliann Pulse and was told her estimated date to return to work is 04/23/17)  Just wanted to make sure this is corrected based on OV 04/03/17.

## 2017-04-19 NOTE — Telephone Encounter (Signed)
1) Prescribe Terconazole 3 Intravaginal treatment x 3 nights, to be started after Amoxyl treatment (see 2). 2) Send a treatment of Amoxyl 250 mg per mouth 3 times a day x 7 days given that patient is still symptomatic and U. Culture showed Lactobacilli species. 3) Can go back to work as supposed to at 6 wks, no need to wait on next visit given that she has had a normal postop course.

## 2017-04-21 ENCOUNTER — Ambulatory Visit: Payer: 59 | Admitting: Neurology

## 2017-04-21 MED ORDER — AMOXICILLIN 250 MG PO CAPS: 250.0000 mg | ORAL_CAPSULE | Freq: Three times a day (TID) | ORAL | 0 refills | Status: DC

## 2017-04-21 MED ORDER — TERCONAZOLE 0.8 % VA CREA: 1.0000 | TOPICAL_CREAM | Freq: Every day | VAGINAL | 0 refills | Status: DC

## 2017-04-21 NOTE — Telephone Encounter (Signed)
Pt aware, Rx sent. 

## 2017-04-30 ENCOUNTER — Encounter: Payer: Self-pay | Admitting: Obstetrics & Gynecology

## 2017-04-30 ENCOUNTER — Ambulatory Visit (INDEPENDENT_AMBULATORY_CARE_PROVIDER_SITE_OTHER): Payer: 59 | Admitting: Obstetrics & Gynecology

## 2017-04-30 ENCOUNTER — Encounter: Payer: Self-pay | Admitting: *Deleted

## 2017-04-30 VITALS — BP 130/84

## 2017-04-30 DIAGNOSIS — Z09 Encounter for follow-up examination after completed treatment for conditions other than malignant neoplasm: Secondary | ICD-10-CM

## 2017-04-30 DIAGNOSIS — N760 Acute vaginitis: Secondary | ICD-10-CM

## 2017-04-30 DIAGNOSIS — B9689 Other specified bacterial agents as the cause of diseases classified elsewhere: Secondary | ICD-10-CM | POA: Diagnosis not present

## 2017-04-30 LAB — WET PREP FOR TRICH, YEAST, CLUE

## 2017-04-30 MED ORDER — FLUCONAZOLE 150 MG PO TABS: 150.0000 mg | ORAL_TABLET | Freq: Every day | ORAL | 0 refills | Status: AC

## 2017-04-30 MED ORDER — METRONIDAZOLE 0.75 % VA GEL: 1.0000 | Freq: Every day | VAGINAL | 0 refills | Status: AC

## 2017-04-30 NOTE — Progress Notes (Signed)
    Kendra Hall 13-Aug-1965 [id number]        51 y.o.  [id number]   RP:  Post op 7 wks TVH 03/11/2017  HPI:  Treated for BV and Yeast vaginitis last visit.  U. Culture pos for Lactobacilli, treated with Amoxyl.  Currently having mild vaginal itching with increased d/c.  No vaginal bleeding.  No pelvic pain.  No UTI Sx.  No fever.  Past medical history,surgical history, problem list, medications, allergies, family history and social history were all reviewed and documented in the EPIC chart.  Directed ROS with pertinent positives and negatives documented in the history of present illness/assessment and plan.  Exam:  Vitals:   04/30/17 1411  BP: 130/84   General appearance:  Normal  Abdo:  Soft, NT.  Gyn exam:  Vulva normal.  Speculum: Vagina normal, but increased thick beige d/c.  No blood.   Wet prep done.  Bimanual exam:   Vaginal vault well healed.  No mass, NT.   Assessment/Plan:  51 y.o. [id number]   1. Follow-up examination after gynecological surgery Good post op evolution.  No complication.  Well healed.  Can return to work.  2. Bacterial vaginosis Wet prep done for increased vaginal d/c.  Bacterial Vaginosis present.  No Yeasts.  Metrogel Vaginally x 5 nights.  Diflucan post ABTx as needed.  U/A TOC done. - WET PREP FOR TRICH, YEAST, CLUE - Urinalysis with Culture Reflex  Counseling on above issues >50% x 15 minutes.  Princess Bruins MD, 2:26 PM 04/30/2017

## 2017-04-30 NOTE — Patient Instructions (Signed)
  1. Follow-up examination after gynecological surgery Good post op evolution.  No complication.  Well healed.  Can return to work.  2. Bacterial vaginosis Wet prep done for increased vaginal d/c.  Bacterial Vaginosis present.  No Yeasts.  Metrogel Vaginally x 5 nights.  Diflucan post ABTx as needed.  U/A TOC done. - WET PREP FOR TRICH, YEAST, CLUE - Urinalysis with Culture Reflex  Remingtyn, good seeing you today!

## 2017-05-01 LAB — NO CULTURE INDICATED

## 2017-05-01 LAB — URINALYSIS W MICROSCOPIC + REFLEX CULTURE
Bacteria, UA: NONE SEEN /HPF
Bilirubin Urine: NEGATIVE
Hgb urine dipstick: NEGATIVE
Hyaline Cast: NONE SEEN /LPF
Ketones, ur: NEGATIVE
Leukocyte Esterase: NEGATIVE
Nitrites, Initial: NEGATIVE
Protein, ur: NEGATIVE
RBC / HPF: NONE SEEN /HPF (ref 0–2)
Specific Gravity, Urine: 1.039 — ABNORMAL HIGH (ref 1.001–1.03)
pH: <=5 (ref 5.0–8.0)

## 2017-05-01 NOTE — Progress Notes (Signed)
Pt was here for her final post op appointment and stated that additional information was needed for her FMLA to approve her RTW date of 04/23/17 not 04/22/17. A letter was signed by Dr Dellis Filbert and faxed to [phone] per pt request.  A copy of the fax confirmation and letter were given to patient. KW CMA

## 2017-05-05 ENCOUNTER — Encounter: Payer: Self-pay | Admitting: Anesthesiology

## 2017-05-12 ENCOUNTER — Other Ambulatory Visit: Payer: Self-pay | Admitting: Internal Medicine

## 2017-05-12 MED ORDER — ALBUTEROL SULFATE HFA 108 (90 BASE) MCG/ACT IN AERS: 2.0000 | INHALATION_SPRAY | RESPIRATORY_TRACT | 2 refills | Status: DC | PRN

## 2017-05-16 ENCOUNTER — Other Ambulatory Visit: Payer: Self-pay | Admitting: Neurology

## 2017-05-20 ENCOUNTER — Telehealth: Payer: Self-pay | Admitting: Neurology

## 2017-05-20 MED ORDER — TOPIRAMATE ER 100 MG PO CAP24: 100.0000 mg | ORAL_CAPSULE | Freq: Every day | ORAL | 6 refills | Status: DC

## 2017-05-20 NOTE — Telephone Encounter (Signed)
Pt is taking Trokendi XR 100 mg. Her insurance denied it, however we provided her with a discount card from [employer] that she is getting free for 1 year. Advsd her I will send in Rx refill

## 2017-05-20 NOTE — Telephone Encounter (Signed)
Patient called and needs to get a refill on her Trokendi. She said the Pharmacy will not refill it for her. She said they are telling her that someone from our office said she was not taking them correctly. Please Call. Thanks

## 2017-05-31 ENCOUNTER — Other Ambulatory Visit: Payer: Self-pay | Admitting: Endocrinology

## 2017-06-11 ENCOUNTER — Other Ambulatory Visit: Payer: Self-pay

## 2017-06-11 ENCOUNTER — Ambulatory Visit (INDEPENDENT_AMBULATORY_CARE_PROVIDER_SITE_OTHER): Payer: 59 | Admitting: Internal Medicine

## 2017-06-11 ENCOUNTER — Encounter: Payer: Self-pay | Admitting: Internal Medicine

## 2017-06-11 VITALS — BP 106/62 | HR 77 | Temp 98.0°F | Wt 186.0 lb

## 2017-06-11 DIAGNOSIS — F333 Major depressive disorder, recurrent, severe with psychotic symptoms: Secondary | ICD-10-CM

## 2017-06-11 DIAGNOSIS — F411 Generalized anxiety disorder: Secondary | ICD-10-CM

## 2017-06-11 DIAGNOSIS — G8929 Other chronic pain: Secondary | ICD-10-CM | POA: Diagnosis not present

## 2017-06-11 DIAGNOSIS — M549 Dorsalgia, unspecified: Secondary | ICD-10-CM | POA: Diagnosis not present

## 2017-06-11 DIAGNOSIS — F4310 Post-traumatic stress disorder, unspecified: Secondary | ICD-10-CM | POA: Diagnosis not present

## 2017-06-11 MED ORDER — ESCITALOPRAM OXALATE 20 MG PO TABS: ORAL_TABLET | ORAL | 3 refills | Status: DC

## 2017-06-11 MED ORDER — BENZONATATE 100 MG PO CAPS: 100.0000 mg | ORAL_CAPSULE | Freq: Two times a day (BID) | ORAL | 0 refills | Status: DC | PRN

## 2017-06-11 MED ORDER — METAXALONE 400 MG PO TABS: 400.0000 mg | ORAL_TABLET | Freq: Three times a day (TID) | ORAL | 1 refills | Status: DC

## 2017-06-11 NOTE — Progress Notes (Signed)
[facility] Progress Note  Subjective:  Kendra Hall is a 51 y.o. female with history of HTN, hypothyroidism, T2DM, depression and fibromyalgia who presents for worsening pain. Pain in her upper back has been worse for the last 1.5 weeks. She notes cold typically makes her pain worse. No recent falls or change in activity. She works from home. She recalls that flexeril made her too sleepy in the day and paradoxically affected her ability to fall asleep. Baclofen did not help in past, per records. Previously prescribed skelaxin in ED but does not recall whether she tried it or whether it worked or not. Tried gabapentin and amitriptyline without improvement in past. Now on lexapro and nortriptyline. She wonders if nortriptyline really helps. She tries to walk over an hour 3 times a week. She also does regular stretching.   Also concerned about her R ear and wants to make sure it does not look infected.  Patient reports her diabetes is followed by Endocrinology and she is to see them next month.   ROS: No change in sensation or focal weakness.    Allergies  Allergen Reactions  . Ergotrate [Ergonovine] Other (See Comments)    Pt does not ever want  . Ibuprofen Other (See Comments)    Patient has ulcer  . Metformin And Related Other (See Comments)    Dizziness  . Yutopar [Ritodrine] Other (See Comments)    Pt does not ever want    Social History   Tobacco Use  . Smoking status: Current Every Day Smoker    Packs/day: 0.50    Years: 30.00    Pack years: 15.00    Types: Cigarettes  . Smokeless tobacco: Never Used  Substance Use Topics  . Alcohol use: No    Comment: rare    Objective: Blood pressure 106/62, pulse 77, temperature 98 F (36.7 C), temperature source Oral, weight 186 lb (84.4 kg), last menstrual period 12/06/2016, SpO2 99 %. Body mass index is 32.43 kg/m. Constitutional: Obese female in NAD HENT: NCAT, normal TMs bilaterally Cardiovascular: RRR, S1,  S2, no m/r/g.  Pulmonary/Chest: Effort normal and breath sounds normal.  Musculoskeletal: Increased tension over L trapezius compared to R.  Neurological: AOx3, no focal deficits. Skin: Skin is warm and dry. No rash noted.  Psychiatric: Normal mood and affect.  Vitals reviewed  Assessment/Plan: Back pain - Tenderness to palpation over trapezius muscles. No recent injury or change in sensation to suggest disc problem.  - Recommended trial of muscle relaxant. Has failed several agents in the past. Ordered skelaxin 400 mg with instructions to increase to 800 mg if does not have relief or drowsiness. - To continue regular exercise and to increase intensity as able - Ordered needed refill of lexapro  Meds ordered this encounter  Medications  . metaxalone (SKELAXIN) 400 MG tablet    Sig: Take 1 tablet (400 mg total) by mouth 3 (three) times daily.    Dispense:  60 tablet    Refill:  1  . escitalopram (LEXAPRO) 20 MG tablet    Sig: TAKE 1 TABLET(20 MG) BY MOUTH DAILY    Dispense:  90 tablet    Refill:  3  . benzonatate (TESSALON) 100 MG capsule    Sig: Take 1 capsule (100 mg total) by mouth 2 (two) times daily as needed for cough.    Dispense:  20 capsule    Refill:  0   Follow-up in about a month if no improvement for consideration of  changing from nortriptyline to another agent.  Olene Floss, MD [place], PGY-3

## 2017-06-11 NOTE — Patient Instructions (Signed)
Kendra Hall,  Continue lexapro and nortriptyline.   Try the muscle relaxant skelaxin. I gave you the 400 mg tablets to start with. If you do not feel very drowsy from that dose, you can take 2 tablets at a time.  Continue regular walking and stretching.  If you don't have improvement over the next month or so, please return to consider switching from nortriptyline to another agent.  Best, Dr. Ola Spurr

## 2017-06-15 ENCOUNTER — Encounter: Payer: Self-pay | Admitting: Internal Medicine

## 2017-06-15 DIAGNOSIS — M549 Dorsalgia, unspecified: Secondary | ICD-10-CM | POA: Insufficient documentation

## 2017-06-15 NOTE — Assessment & Plan Note (Signed)
-   Tenderness to palpation over trapezius muscles. No recent injury or change in sensation to suggest disc problem.  - Recommended trial of muscle relaxant. Has failed several agents in the past. Ordered skelaxin 400 mg with instructions to increase to 800 mg if does not have relief or drowsiness. - To continue regular exercise and to increase intensity as able - Ordered needed refill of lexapro

## 2017-06-18 ENCOUNTER — Other Ambulatory Visit: Payer: Self-pay | Admitting: Internal Medicine

## 2017-06-18 MED ORDER — NORTRIPTYLINE HCL 25 MG PO CAPS: ORAL_CAPSULE | ORAL | 3 refills | Status: DC

## 2017-06-19 ENCOUNTER — Other Ambulatory Visit: Payer: Self-pay | Admitting: Internal Medicine

## 2017-06-19 MED ORDER — NORTRIPTYLINE HCL 25 MG PO CAPS: ORAL_CAPSULE | ORAL | 3 refills | Status: DC

## 2017-06-21 ENCOUNTER — Other Ambulatory Visit: Payer: Self-pay | Admitting: Internal Medicine

## 2017-07-01 ENCOUNTER — Other Ambulatory Visit: Payer: Self-pay | Admitting: Neurology

## 2017-07-01 ENCOUNTER — Other Ambulatory Visit: Payer: Self-pay | Admitting: Endocrinology

## 2017-07-02 ENCOUNTER — Other Ambulatory Visit: Payer: Self-pay

## 2017-07-02 ENCOUNTER — Encounter: Payer: Self-pay | Admitting: Family Medicine

## 2017-07-02 ENCOUNTER — Ambulatory Visit (INDEPENDENT_AMBULATORY_CARE_PROVIDER_SITE_OTHER): Payer: 59 | Admitting: Family Medicine

## 2017-07-02 DIAGNOSIS — M797 Fibromyalgia: Secondary | ICD-10-CM

## 2017-07-02 MED ORDER — PREDNISONE 20 MG PO TABS: 40.0000 mg | ORAL_TABLET | Freq: Every day | ORAL | 0 refills | Status: AC

## 2017-07-02 MED ORDER — METAXALONE 400 MG PO TABS: 800.0000 mg | ORAL_TABLET | Freq: Three times a day (TID) | ORAL | 1 refills | Status: DC

## 2017-07-02 MED ORDER — BENZONATATE 100 MG PO CAPS: 100.0000 mg | ORAL_CAPSULE | Freq: Two times a day (BID) | ORAL | 0 refills | Status: DC | PRN

## 2017-07-02 NOTE — Patient Instructions (Signed)
Thank you for coming in today, it was so nice to see you! Today we talked about:    Fibromyalgia flare: Please take the Skelaxin 800 mg three times a day. Take Prednisone 40 mg once daily for 5 days. Continue light stretching exercises and walking.   Please follow up in 2-4 weeks with PCP for fibromyalgia follow up. You can schedule this appointment at the front desk before you leave or call the clinic.  Bring in all your medications or supplements to each appointment for review.   If we ordered any tests today, you will be notified via telephone of any abnormalities. If everything is normal you will get a letter in the mail.   If you have any questions or concerns, please do not hesitate to call the office at [phone]. You can also message me directly via MyChart.   Sincerely,  Smitty Cords, MD

## 2017-07-02 NOTE — Progress Notes (Signed)
Subjective:    Patient ID: Kendra Hall , female   DOB: Jul 03, 1965 , 52 y.o..   MRN: [id number]  HPI  Kendra Hall is here for a same-day visit for  Chief Complaint  Patient presents with  . Fibromyalgia    1. Fibromyalgia:  Patient notes that she has had increasing fibromyalgia pain over the last week.  She notes that she has been very stiff and could hardly get out of bed this morning.  She did not sleep well last night secondary to the pain.  She feels pain mostly in her back.  She notes that she gets fibromyalgia flares a couple times a month.  She has tried heating pads, stretching, hot showers, and staying active and none of these have taken the edge off her pain.  She tries walking 3 times a week.  Is taking Lexapro, pamelor, and Skelaxin. Skelaxin was started on 12/19 without much relief. Has been taking Skelaxin 800mg  BID. She was told she could take it up to TID.   Review of Systems: Per HPI.   Past Medical History: Patient Active Problem List   Diagnosis Date Noted  . Back pain 06/15/2017  . Postoperative state 03/11/2017  . Trigger point 11/28/2016  . Health care maintenance 02/04/2016  . Severe episode of recurrent major depressive disorder, with psychotic features (Kendra Hall) 08/22/2015  . GAD (generalized anxiety disorder) 08/22/2015  . PTSD (post-traumatic stress disorder) 08/22/2015  . Insomnia 08/22/2015  . GERD (gastroesophageal reflux disease) 04/18/2015  . Fibromyalgia 03/29/2015  . Type 2 diabetes mellitus without complication ([place])   . TIA (transient ischemic attack) 03/15/2015  . Mild intermittent asthma 02/28/2012  . Anxiety disorder 02/07/2012  . Hypertension 01/07/2012  . Hypothyroidism following radioiodine therapy 08/28/2011  . Grave's disease 05/07/2011  . Family hx-breast malignancy 11/20/2010  . Chronic migraine without aura 05/04/2010  . TOBACCO USER 03/11/2009  . Notalgia 01/09/2009  . OBESITY, UNSPECIFIED 09/27/2008  . Mood disorder ([place])  09/22/2008    Medications: reviewed  Current Outpatient Medications  Medication Sig Dispense Refill  . albuterol (PROAIR HFA) 108 (90 Base) MCG/ACT inhaler Inhale 2 puffs every 4 (four) hours as needed into the lungs for wheezing. 1 Inhaler 2  . benzonatate (TESSALON) 100 MG capsule Take 1 capsule (100 mg total) by mouth 2 (two) times daily as needed for cough. 20 capsule 0  . Blood Glucose Calibration (ONETOUCH VERIO) SOLN Used to check meter accuracy 1 each 0  . Dulaglutide (TRULICITY) 1.5 NI/7.7OE SOPN Inject 1.5 mg weekly. 4 pen 11  . escitalopram (LEXAPRO) 20 MG tablet TAKE 1 TABLET(20 MG) BY MOUTH DAILY 90 tablet 3  . glucose blood (ONETOUCH VERIO) test strip 1 each by Other route 4 (four) times daily. And lancets 4/day 450 each 12  . INVOKANA 300 MG TABS tablet TAKE 1 TABLET(300 MG) BY MOUTH DAILY BEFORE BREAKFAST 30 tablet 0  . levothyroxine (SYNTHROID, LEVOTHROID) 125 MCG tablet Take 1 tablet (125 mcg total) by mouth daily. 60 tablet 2  . linaclotide (LINZESS) 145 MCG CAPS capsule Take 145 mcg by mouth daily before breakfast.    . liraglutide (VICTOZA) 18 MG/3ML SOPN Inject 0.2 mLs (1.2 mg total) into the skin daily. 9 mL 3  . metaxalone (SKELAXIN) 400 MG tablet Take 2 tablets (800 mg total) by mouth 3 (three) times daily. 60 tablet 1  . naproxen (NAPROSYN) 500 MG tablet TAKE 1 TABLET(500 MG) BY MOUTH EVERY 12 HOURS AS NEEDED 180 tablet 2  .  nortriptyline (PAMELOR) 25 MG capsule TAKE 3 CAPSULES(75 MG) BY MOUTH AT BEDTIME 270 capsule 3  . ondansetron (ZOFRAN) 4 MG tablet Take 1 tablet (4 mg total) by mouth every 8 (eight) hours as needed for nausea or vomiting. 20 tablet 2  . Polyethyl Glycol-Propyl Glycol (SYSTANE OP) Apply 1-2 drops to eye daily as needed (dryness).    . predniSONE (DELTASONE) 20 MG tablet Take 2 tablets (40 mg total) by mouth daily with breakfast for 5 days. 10 tablet 0  . repaglinide (PRANDIN) 0.5 MG tablet Take 1 tablet (0.5 mg total) by mouth 3 (three) times daily  before meals. 90 tablet 11  . rizatriptan (MAXALT-MLT) 10 MG disintegrating tablet Take 1 tablet earliest onset of headache.  May repeat once in 2 hours if needed 9 tablet 11  . Topiramate ER (TROKENDI XR) 100 MG CP24 Take 1 capsule by mouth at bedtime.    . Topiramate ER (TROKENDI XR) 100 MG CP24 Take 100 mg by mouth daily. 30 capsule 6   No current facility-administered medications for this visit.     Social Hx:  reports that she has quit smoking. Her smoking use included cigarettes. She has a 15.00 pack-year smoking history. she has never used smokeless tobacco.   Objective:   BP 118/60   Pulse 83   Temp 98.3 F (36.8 C) (Oral)   Ht 5' 3.5" (1.613 m)   Wt 188 lb 9.6 oz (85.5 kg)   LMP 12/06/2016 (Approximate)   SpO2 99%   BMI 32.88 kg/m  Physical Exam  Gen: NAD, alert, cooperative with exam, appears to be in pain but smiles intermittently through exam Back: No obvious abnormality seen on inspection, tender to even light palpation in all areas of back, limited range of motion secondary to pain, would not allow assessment of strength, normal gait  Assessment & Plan:  Fibromyalgia Patient endorsing increased pain secondary to fibromyalgia, her pain is mostly in her back.  Likely fibromyalgia flare.  Diffusely tender in back to even light palpation throughout.  With not let examiner do more than lightly touch her back.  She is already on several medications for fibromyalgia including Skelaxin, nortriptyline, Lexapro.  States that she has tried many muscle relaxers that have failed her in the past.  States she cannot have NSAIDs secondary to history of gastric ulcer.  Tylenol is not working for her.  Our options are limited in terms of pain control while trying to avoid controlled substances. -Increase Skelaxin to 800 mg 3 times daily -Will try 5-day course of prednisone 40 mg daily -Continue conservative measures including light stretching, regular exercise, heating pads, getting  plenty of rest - Follow-up as needed   Smitty Cords, MD [place], PGY-3

## 2017-07-04 ENCOUNTER — Telehealth: Payer: Self-pay | Admitting: Internal Medicine

## 2017-07-04 NOTE — Telephone Encounter (Signed)
Pt said her FMLA paperwork was faxed over to be filled out. Please notify patient when this has been done.

## 2017-07-06 NOTE — Assessment & Plan Note (Addendum)
Patient endorsing increased pain secondary to fibromyalgia, her pain is mostly in her back.  Likely fibromyalgia flare.  Diffusely tender in back to even light palpation throughout.  With not let examiner do more than lightly touch her back.  She is already on several medications for fibromyalgia including Skelaxin, nortriptyline, Lexapro.  States that she has tried many muscle relaxers that have failed her in the past.  States she cannot have NSAIDs secondary to history of gastric ulcer.  Tylenol is not working for her.  Our options are limited in terms of pain control while trying to avoid controlled substances. -Increase Skelaxin to 800 mg 3 times daily -Will try 5-day course of prednisone 40 mg daily -Continue conservative measures including light stretching, regular exercise, heating pads, getting plenty of rest - Follow-up as needed

## 2017-07-08 NOTE — Telephone Encounter (Signed)
The FMLA paperwork is not in my box. If patient is requesting this related to hysterectomy, Ob-gyn needs to complete. If it is for another concern, patient would need an appointment at the [facility].   Phill Myron, D.O. 07/08/2017, 2:26 PM PGY-3, [place]

## 2017-07-10 NOTE — Telephone Encounter (Signed)
Contacted pt and informed her of below and she said it is due to her fibromyalgia and she is going to contact them to resend the papers. Routing to PCP as an Pharmacist, hospital. Katharina Caper, April D, [place]

## 2017-07-14 NOTE — Telephone Encounter (Signed)
I have received the paperwork. Patient has not been seen for me for fibromyalgia in the past and I don't see scan of prior FMLA paperwork done for this condition. Please have patient make appointment to have this discussed and to potentially fill out paperwork at this appointment.   Phill Myron, D.O. 07/14/2017, 2:44 PM PGY-3, [place]

## 2017-07-15 NOTE — Telephone Encounter (Signed)
Message was given to pt. Next appt available is feb 6.  Deadline for the FMLA paperwork is Feb 4.  The last [employer] paperwork was done in the jan 2018.  Please advise

## 2017-07-16 ENCOUNTER — Ambulatory Visit: Payer: 59 | Admitting: Endocrinology

## 2017-07-17 ENCOUNTER — Other Ambulatory Visit: Payer: Self-pay | Admitting: Endocrinology

## 2017-07-18 NOTE — Telephone Encounter (Signed)
I would have her come in to see a different provider as I am unable to fill out her FMLA regarding time off needed, limitations etc. Without seeing her.   Phill Myron, D.O. 07/18/2017, 1:40 PM PGY-3, [place]

## 2017-07-22 NOTE — Telephone Encounter (Signed)
Contacted pt and gave her the below information, checked to see about getting her in earlier and she stated that she was not going to be able to come in before the 6th and she had spoken to her job about it.  She stated that she had a form completed for her fibromyalgia before and I searched in the media section and saw it from October of 2017, explained that she would probably definitely need an appointment since it had been that long.  She did mention when she comes in on the 6th to have paperwork completed she mentioned it having to be backdated and I told her to be sure to let the doctor know at the visit what the dates would need to be. Routing to PCP as an Pharmacist, hospital. Katharina Caper, April D, [place]

## 2017-07-28 ENCOUNTER — Other Ambulatory Visit: Payer: Self-pay

## 2017-07-30 ENCOUNTER — Telehealth: Payer: Self-pay | Admitting: Internal Medicine

## 2017-07-30 ENCOUNTER — Ambulatory Visit (INDEPENDENT_AMBULATORY_CARE_PROVIDER_SITE_OTHER): Payer: 59 | Admitting: Internal Medicine

## 2017-07-30 ENCOUNTER — Other Ambulatory Visit: Payer: Self-pay | Admitting: Internal Medicine

## 2017-07-30 ENCOUNTER — Encounter: Payer: Self-pay | Admitting: Internal Medicine

## 2017-07-30 ENCOUNTER — Other Ambulatory Visit: Payer: Self-pay

## 2017-07-30 VITALS — BP 128/70 | HR 80 | Temp 98.4°F | Wt 194.0 lb

## 2017-07-30 DIAGNOSIS — M797 Fibromyalgia: Secondary | ICD-10-CM

## 2017-07-30 NOTE — Progress Notes (Signed)
   Subjective:    Kendra Hall - 52 y.o. female MRN [id number]  Date of birth: Jul 23, 1965  HPI  Kendra Hall is here for Kendra Hall paperwork for fibromyalgia. Unfortunately, she does not have the paperwork for completion today. 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 Most of her fibromyalgia pain is located in her back and neck although she does occasionally have diffuse pain. She has flare ups at least 6x/month. These vary in intensity sometimes limiting her for a few hours and sometimes limiting her for several days.    -  reports that she has quit smoking. Her smoking use included cigarettes. She has a 15.00 pack-year smoking history. she has never used smokeless tobacco. - Review of Systems: Per HPI. - Past Medical History: Patient Active Problem List   Diagnosis Date Noted  . Back pain 06/15/2017  . Postoperative state 03/11/2017  . Trigger point 11/28/2016  . Health care maintenance 02/04/2016  . Severe episode of recurrent major depressive disorder, with psychotic features (Kendra Hall) 08/22/2015  . GAD (generalized anxiety disorder) 08/22/2015  . PTSD (post-traumatic stress disorder) 08/22/2015  . Insomnia 08/22/2015  . GERD (gastroesophageal reflux disease) 04/18/2015  . Fibromyalgia 03/29/2015  . Type 2 diabetes mellitus without complication (Kendra Hall)   . TIA (transient ischemic attack) 03/15/2015  . Mild intermittent asthma 02/28/2012  . Anxiety disorder 02/07/2012  . Hypertension 01/07/2012  . Hypothyroidism following radioiodine therapy 08/28/2011  . Grave's disease 05/07/2011  . Family hx-breast malignancy 11/20/2010  . Chronic migraine without aura 05/04/2010  . TOBACCO USER 03/11/2009  . Notalgia 01/09/2009  . OBESITY, UNSPECIFIED 09/27/2008  . Mood disorder (Kendra Hall) 09/22/2008   - Medications: reviewed and updated   Objective:   Physical Exam BP 128/70   Pulse 80   Temp 98.4 F (36.9 C) (Oral)   Wt 194 lb (88 kg)   LMP 12/06/2016 (Approximate)   BMI 33.83  kg/m  Gen: NAD, alert, cooperative with exam, well-appearing MSK: Diffuse TTP of cervical paraspinal muscles and upper/lower back. Full ROM at all extremities. Normal gait.  Psych: good insight, alert and oriented     Assessment & Plan:   1. Fibromyalgia Will fill out FMLA paperwork when it is received. This is a chronic condition that patient has been under medical care for several years.   Phill Myron, D.O. 07/30/2017, 9:19 AM PGY-3, [place]

## 2017-07-30 NOTE — Telephone Encounter (Signed)
Patient needs refill for inhaler and her benzonatate also, sent to [facility] on [address].  Any questions, please call her at [phone].

## 2017-07-30 NOTE — Patient Instructions (Signed)
Please bring or send the FLMA paperwork. I will fill it out ASAP and leave it for pick up for you. Thanks for coming in today!   Dr. Juleen China

## 2017-07-30 NOTE — Telephone Encounter (Signed)
Clinical info completed on FMLA form.  Place form in Dr. Alcario Drought box for completion.  Ottis Stain, CMA

## 2017-07-30 NOTE — Telephone Encounter (Signed)
FMLA form dropped off for at front desk for completion.  Verified that patient section of form has been completed.  Last DOS/WCC with PCP was 07/30/17.  Placed form in team folder to be completed by clinical staff.  Crista Luria

## 2017-07-31 ENCOUNTER — Other Ambulatory Visit: Payer: Self-pay | Admitting: Internal Medicine

## 2017-07-31 MED ORDER — ALBUTEROL SULFATE HFA 108 (90 BASE) MCG/ACT IN AERS: 2.0000 | INHALATION_SPRAY | RESPIRATORY_TRACT | 2 refills | Status: DC | PRN

## 2017-07-31 MED ORDER — BENZONATATE 100 MG PO CAPS: 100.0000 mg | ORAL_CAPSULE | Freq: Two times a day (BID) | ORAL | 0 refills | Status: DC | PRN

## 2017-07-31 NOTE — Telephone Encounter (Signed)
Reviewed, completed, and signed FMLA form.  Note routed to RN team inbasket and placed completed form in Clinic RN's office (wall pocket above desk).  Melina Schools, DO

## 2017-07-31 NOTE — Progress Notes (Signed)
Have sent in refills.   Phill Myron, D.O. 07/31/2017, 11:15 AM PGY-3, [place]

## 2017-08-01 NOTE — Telephone Encounter (Signed)
Left message on voicemail that forms are complete. Faxed forms to [phone] and placed to file cabinet for faxes. Danley Danker, RN [facility] RN)

## 2017-08-06 ENCOUNTER — Ambulatory Visit (INDEPENDENT_AMBULATORY_CARE_PROVIDER_SITE_OTHER): Payer: 59 | Admitting: Endocrinology

## 2017-08-06 ENCOUNTER — Encounter: Payer: Self-pay | Admitting: Endocrinology

## 2017-08-06 VITALS — BP 128/78 | HR 75 | Wt 194.0 lb

## 2017-08-06 DIAGNOSIS — E119 Type 2 diabetes mellitus without complications: Secondary | ICD-10-CM

## 2017-08-06 LAB — POCT GLYCOSYLATED HEMOGLOBIN (HGB A1C): Hemoglobin A1C: 6

## 2017-08-06 NOTE — Progress Notes (Signed)
Subjective:    Patient ID: Kendra Hall, female    DOB: 1965/11/24, 52 y.o.   MRN: [id number]  HPI Pt returns for f/u of diabetes mellitus:  DM type: Insulin-requiring type 2.  Dx'ed: 6378 Complications: none.  Therapy: trulicity and victoza.  GDM: 1984.   DKA: never.   Severe hypoglycemia: once, in 2018.  Pancreatitis: never.  Other: metformin caused dizziness; she takes both trulicity and victoza, and she wants to continue; she took insulin 2012-2018. Interval history:  Since off insulin, she says cbg's vary from 82-120.  There is no trend throughout the day.  She stopped invokana due to vaginal irritation.  The sxs resolved off it.  Past Medical History:  Diagnosis Date  . Allergy   . Anemia    history of  . Anxiety   . Asthma   . Depression   . Diabetes mellitus   . Fibromyalgia   . Gastric ulcer   . GERD (gastroesophageal reflux disease)   . Grave's disease   . Graves disease   . H/O therapeutic radiation   . Migraine   . Multiple thyroid nodules   . Myofacial muscle pain   . Osteoarthritis    Facet hypertrophy with injections  . TIA (transient ischemic attack)    02/2015    Past Surgical History:  Procedure Laterality Date  . AXILLARY LYMPH NODE DISSECTION  2001  . HYDRADENITIS EXCISION    . TUBAL LIGATION  1995  . VAGINAL HYSTERECTOMY Left 03/11/2017   Procedure: HYSTERECTOMY VAGINAL W/LEFT PROXIMAL SALPINGECTOMY;  Surgeon: Princess Bruins, MD;  Location: [facility];  Service: Gynecology;  Laterality: Left;  request 7:30am OR time  request 2 hours      Social History   Socioeconomic History  . Marital status: Single    Spouse name: Not on file  . Number of children: 4  . Years of education: 58  . Highest education level: Not on file  Social Needs  . Financial resource strain: Not on file  . Food insecurity - worry: Not on file  . Food insecurity - inability: Not on file  . Transportation needs - medical: Not on file  . Transportation needs -  non-medical: Not on file  Occupational History  . Occupation: Research scientist (physical sciences): [employer]  Tobacco Use  . Smoking status: Former Smoker    Packs/day: 0.50    Years: 30.00    Pack years: 15.00    Types: Cigarettes  . Smokeless tobacco: Never Used  Substance and Sexual Activity  . Alcohol use: No    Comment: rare  . 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  Today denies all drug abuse  . Sexual activity: Not Currently    Birth control/protection: Surgical  Other Topics Concern  . Not on file  Social History Narrative   Lives alone in [place]. Her daugther occassoinally comes and stay with her. 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  originally from [place],  [place].  Raised by mom and her sister who is 8 yrs older than pt. Pt has several half siblings. No longer on disability. Pt has 4 kids, never married.           Current Outpatient Medications on File Prior to Visit  Medication Sig Dispense Refill  .  albuterol (PROAIR HFA) 108 (90 Base) MCG/ACT inhaler Inhale 2 puffs into the lungs every 4 (four) hours as needed for wheezing. 1 Inhaler 2  . benzonatate (TESSALON) 100 MG capsule Take 1 capsule (100 mg total) by mouth 2 (two) times daily as needed for cough. 20 capsule 0  . Blood Glucose Calibration (ONETOUCH VERIO) SOLN Used to check meter accuracy 1 each 0  . Dulaglutide (TRULICITY) 1.5 IH/4.7QQ SOPN Inject 1.5 mg weekly. 4 pen 11  . escitalopram (LEXAPRO) 20 MG tablet TAKE 1 TABLET(20 MG) BY MOUTH DAILY 90 tablet 3  . glucose blood (ONETOUCH VERIO) test strip 1 each by Other route 4 (four) times daily. And lancets 4/day 450 each 12  . levothyroxine (SYNTHROID, LEVOTHROID) 125 MCG tablet TAKE 1 TABLET BY MOUTH DAILY 60 tablet 0  . linaclotide (LINZESS) 145 MCG CAPS capsule Take 145 mcg by mouth daily before  breakfast.    . metaxalone (SKELAXIN) 400 MG tablet Take 2 tablets (800 mg total) by mouth 3 (three) times daily. 60 tablet 1  . naproxen (NAPROSYN) 500 MG tablet TAKE 1 TABLET(500 MG) BY MOUTH EVERY 12 HOURS AS NEEDED 180 tablet 2  . nortriptyline (PAMELOR) 25 MG capsule TAKE 3 CAPSULES(75 MG) BY MOUTH AT BEDTIME 270 capsule 3  . ondansetron (ZOFRAN) 4 MG tablet Take 1 tablet (4 mg total) by mouth every 8 (eight) hours as needed for nausea or vomiting. 20 tablet 2  . Polyethyl Glycol-Propyl Glycol (SYSTANE OP) Apply 1-2 drops to eye daily as needed (dryness).    . rizatriptan (MAXALT-MLT) 10 MG disintegrating tablet Take 1 tablet earliest onset of headache.  May repeat once in 2 hours if needed 9 tablet 11  . Topiramate ER (TROKENDI XR) 100 MG CP24 Take 1 capsule by mouth at bedtime.    . Topiramate ER (TROKENDI XR) 100 MG CP24 Take 100 mg by mouth daily. 30 capsule 6  . VICTOZA 18 MG/3ML SOPN ADMINISTER 1.2 MG UNDER THE SKIN DAILY 9 mL 0   No current facility-administered medications on file prior to visit.     Allergies  Allergen Reactions  . Ergotrate [Ergonovine] Other (See Comments)    Pt does not ever want  . Ibuprofen Other (See Comments)    Patient has ulcer  . Metformin And Related Other (See Comments)    Dizziness  . Yutopar [Ritodrine] Other (See Comments)    Pt does not ever want    Family History  Problem Relation Age of Onset  . Hypertension Mother   . Diabetes Mother   . Cancer Mother        rectal  . Heart disease Father 51       MI x5  . Hypertension Father   . Diabetes Father   . Cancer Sister 66       breast  . Drug abuse Brother   . Alcohol abuse Brother   . Drug abuse Brother   . Alcohol abuse Brother   . Anxiety disorder Neg Hx   . Bipolar disorder Neg Hx   . Depression Neg Hx     BP 128/78 (BP Location: Left Arm, Patient Position: Sitting, Cuff Size: Normal)   Pulse 75   Wt 194 lb (88 kg)   LMP 12/06/2016 (Approximate)   SpO2 98%   BMI 33.83  kg/m   Review of Systems She denies hypoglycemia.  She has regained a few lbs.     Objective:   Physical Exam VITAL SIGNS:  See vs page  GENERAL: no distress Pulses: dorsalis pedis intact bilat.   MSK: no deformity of the feet.  CV: no leg edema.  Skin:  no ulcer on the feet.  normal color and temp on the feet. Neuro: sensation is intact to touch on the feet  Lab Results  Component Value Date   HGBA1C 6.0 08/06/2017       Assessment & Plan:  Type 2 DM: well-controlled Vaginits: this limits rx options.  Patient Instructions  Please continue the same medications.  check your blood sugar once a day.  vary the time of day when you check, between before the 3 meals, and at bedtime.  also check if you have symptoms of your blood sugar being too high or too low.  please keep a record of the readings and bring it to your next appointment here (or you can bring the meter itself).  You can write it on any piece of paper.  please call us sooner if your blood sugar goes below 70, or if you have a lot of readings over 200.  Please come back for a follow-up appointment in 3-6 months.

## 2017-08-06 NOTE — Patient Instructions (Addendum)
Please continue the same medications. check your blood sugar once a day.  vary the time of day when you check, between before the 3 meals, and at bedtime.  also check if you have symptoms of your blood sugar being too high or too low.  please keep a record of the readings and bring it to your next appointment here (or you can bring the meter itself).  You can write it on any piece of paper.  please call us sooner if your blood sugar goes below 70, or if you have a lot of readings over 200. Please come back for a follow-up appointment in 3-6 months.   

## 2017-08-07 NOTE — Telephone Encounter (Signed)
Re faxed forms on 08/07/2017 @ 4:447pm. Called [employer] disability to confirm receipt and spoke to [place]. He said it can take 4 hours before the fax is put into the system. I told him I would call back tomorrow am and asked if he would document my call and inquire. Informed pt. Ottis Stain, CMA

## 2017-08-07 NOTE — Telephone Encounter (Signed)
Patient calling because they never received this FMLA paperwork from last Friday. Patient told them that she has a message from nurse saying they were faxed. At this point the paperwork is late and they are talking about closing out her account. The forms needs to be faxed again to either the [phone] number or [phone] with ATTN: Selena, but the patient was told that one of two things have to be faxed with the paperwork. 1 - it has to be faxed with prove/confimation that it was attempted to be faxed on 2/8 OR 2 - a letter has to be faxed with paperwork stating that it wasn't faxed and a letter has to be written that the office is taking responsibility for why it's late. Please advise

## 2017-08-20 ENCOUNTER — Telehealth: Payer: Self-pay | Admitting: Internal Medicine

## 2017-08-20 NOTE — Telephone Encounter (Signed)
Pt has recently had to call too many EMS to come get her because of her diabetes and her fibromyalgia. Her daughter has offered for her to move in with her so someone can be around her to watch out for her with her health. 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 She already works from home and her manager has given her the okay to move since she does work from home. Please give the patient a call at [phone] to discuss this with her and she said it's fine to leave her a voicemail and she will call back . Please advise

## 2017-08-25 ENCOUNTER — Telehealth: Payer: Self-pay

## 2017-08-25 NOTE — Telephone Encounter (Signed)
Pt called nurse line to check status of this letter. Pt call back [phone] Wallace Cullens, RN

## 2017-08-25 NOTE — Telephone Encounter (Signed)
Received PA for Victoza and pt states she is on Trulicity and Victoza, She has a coupon card for the Victoza and does not feel like she needs the PA because she didn't before with the coupon card. She will call office back if she needs a PA but at this time disregard all PA forms

## 2017-08-26 NOTE — Telephone Encounter (Signed)
Pt informed. Pt very appreciative. Ottis Stain, CMA

## 2017-08-26 NOTE — Telephone Encounter (Signed)
I have created a letter for patient requesting that she be able to terminate her lease early due to her medical conditions. I have routed this to the admin pool to be left for the patient for pick up.   Phill Myron, D.O. 08/26/2017, 11:46 AM PGY-3, [place]

## 2017-09-03 ENCOUNTER — Other Ambulatory Visit: Payer: Self-pay

## 2017-09-03 ENCOUNTER — Encounter: Payer: Self-pay | Admitting: Internal Medicine

## 2017-09-03 ENCOUNTER — Other Ambulatory Visit: Payer: Self-pay | Admitting: Internal Medicine

## 2017-09-03 ENCOUNTER — Ambulatory Visit (INDEPENDENT_AMBULATORY_CARE_PROVIDER_SITE_OTHER): Payer: 59 | Admitting: Internal Medicine

## 2017-09-03 VITALS — BP 126/70 | HR 84 | Temp 98.7°F | Ht 64.0 in | Wt 193.2 lb

## 2017-09-03 DIAGNOSIS — M797 Fibromyalgia: Secondary | ICD-10-CM | POA: Diagnosis not present

## 2017-09-03 DIAGNOSIS — E890 Postprocedural hypothyroidism: Secondary | ICD-10-CM

## 2017-09-03 DIAGNOSIS — E039 Hypothyroidism, unspecified: Secondary | ICD-10-CM | POA: Diagnosis not present

## 2017-09-03 DIAGNOSIS — K219 Gastro-esophageal reflux disease without esophagitis: Secondary | ICD-10-CM

## 2017-09-03 MED ORDER — RANITIDINE HCL 150 MG PO TABS: 150.0000 mg | ORAL_TABLET | Freq: Two times a day (BID) | ORAL | 1 refills | Status: DC

## 2017-09-03 MED ORDER — DULOXETINE HCL 30 MG PO CPEP: 30.0000 mg | ORAL_CAPSULE | Freq: Every day | ORAL | 0 refills | Status: DC

## 2017-09-03 NOTE — Progress Notes (Signed)
   Subjective:    Kendra Hall - 52 y.o. female MRN [id number]  Date of birth: [birth year]  HPI  Kendra Hall is here for follow up of her fibromyalgia.  Fibromyalgia: Has continued to have increasing pain over the last several weeks despite increase in Skelaxin in January. She feels pain mostly located in her upper back. It is typical for her to have a flare a couple times per month. She has tried heating pads, hot showers, physical activity, and stretching. She walks at least three times per week. She is taking Nortriptyline for fibromyalgia. She reports significant dry mouth and wishes to try a different medication. She also takes Lexapro for depression and fibromyalgia.   Hypothyroidism: Patient reports daily compliance with Synthroid. She has not missed any doses. No increased fatigue, changes in hair/nails, hot/cold intolerance, or unintended weight changes.     -  reports that she has quit smoking. Her smoking use included cigarettes. She has a 15.00 pack-year smoking history. she has never used smokeless tobacco. - Review of Systems: Per HPI. - Past Medical History: Patient Active Problem List   Diagnosis Date Noted  . Back pain 06/15/2017  . Postoperative state 03/11/2017  . Trigger point 11/28/2016  . Health care maintenance 02/04/2016  . Severe episode of recurrent major depressive disorder, with psychotic features ([place]) 08/22/2015  . GAD (generalized anxiety disorder) 08/22/2015  . PTSD (post-traumatic stress disorder) 08/22/2015  . Insomnia 08/22/2015  . GERD (gastroesophageal reflux disease) 04/18/2015  . Fibromyalgia 03/29/2015  . Type 2 diabetes mellitus without complication ([place])   . TIA (transient ischemic attack) 03/15/2015  . Mild intermittent asthma 02/28/2012  . Anxiety disorder 02/07/2012  . Hypertension 01/07/2012  . Hypothyroidism following radioiodine therapy 08/28/2011  . Grave's disease 05/07/2011  . Family hx-breast malignancy 11/20/2010  . Chronic  migraine without aura 05/04/2010  . TOBACCO USER 03/11/2009  . Notalgia 01/09/2009  . OBESITY, UNSPECIFIED 09/27/2008  . Mood disorder ([place]) 09/22/2008   - Medications: reviewed and updated   Objective:   Physical Exam BP 126/70   Pulse 84   Temp 98.7 F (37.1 C) (Oral)   Ht 5\' 4"  (1.626 m)   Wt 193 lb 3.2 oz (87.6 kg)   LMP 12/06/2016 (Approximate)   SpO2 98%   BMI 33.16 kg/m  Gen: NAD, alert, cooperative with exam, appears uncomfortable secondary to pain but able to interact and smiles appropriately  Back: No obvious abnormality seen on inspection, tender to even light palpation in all areas of back, limited range of motion secondary to pain, strength of extremities intact, normal gait      Assessment & Plan:   Fibromyalgia Patient with persistent fibromyalgia pain despite increase in dose of muscle relaxer. Also wishing to change therapy due to side effect from Nortriptyline. She is diffusely tender in back even to light palpation throughout. Will discontinue Nortriptyline due to adverse effect from TCA. Will attempt to transition from Lexapro to Cymbalta as Cymbalta has some indication for fibromyalgia pain and depression. Patient to follow up in 2-4 weeks. Anticipate may need dose increase in medication. Continue with conservative measures and Skelaxin for pain control.   Hypothyroidism following radioiodine therapy Last TSH remarkably elevated in July 2018 at 5. Patient reports compliance with Synthroid 125 mcg daily. Will repeat TSH today.     Phill Myron, D.O. 09/08/2017, 3:50 PM PGY-3, [place]

## 2017-09-03 NOTE — Patient Instructions (Addendum)
Stop the Nortriptyline and the Lexapro. I have prescribed Cymbalta for your fibromyalgia and depression. Wait about 3-4 days to start taking it to allow the other medications to wash out of your system. Then, start taking the Cymbalta. We can increase the dose if you need in a couple weeks. Just call to let me know how you are doing.

## 2017-09-04 LAB — TSH: TSH: 0.057 u[IU]/mL — ABNORMAL LOW (ref 0.450–4.500)

## 2017-09-08 NOTE — Assessment & Plan Note (Signed)
Patient with persistent fibromyalgia pain despite increase in dose of muscle relaxer. Also wishing to change therapy due to side effect from Nortriptyline. She is diffusely tender in back even to light palpation throughout. Will discontinue Nortriptyline due to adverse effect from TCA. Will attempt to transition from Lexapro to Cymbalta as Cymbalta has some indication for fibromyalgia pain and depression. Patient to follow up in 2-4 weeks. Anticipate may need dose increase in medication. Continue with conservative measures and Skelaxin for pain control.

## 2017-09-08 NOTE — Assessment & Plan Note (Signed)
Last TSH remarkably elevated in July 2018 at 83. Patient reports compliance with Synthroid 125 mcg daily. Will repeat TSH today.

## 2017-09-09 ENCOUNTER — Telehealth: Payer: Self-pay

## 2017-09-09 ENCOUNTER — Other Ambulatory Visit: Payer: Self-pay | Admitting: Internal Medicine

## 2017-09-09 DIAGNOSIS — E039 Hypothyroidism, unspecified: Secondary | ICD-10-CM

## 2017-09-09 DIAGNOSIS — M797 Fibromyalgia: Secondary | ICD-10-CM

## 2017-09-09 MED ORDER — DULOXETINE HCL 60 MG PO CPEP: 60.0000 mg | ORAL_CAPSULE | Freq: Every day | ORAL | 3 refills | Status: DC

## 2017-09-09 MED ORDER — LEVOTHYROXINE SODIUM 112 MCG PO TABS: 112.0000 ug | ORAL_TABLET | Freq: Every day | ORAL | 3 refills | Status: DC

## 2017-09-09 NOTE — Progress Notes (Signed)
Spoke to pt. Informed her of the info below. Pt had good understanding. Pt is asking if Dr. Aletha Halim increase Cymbalta. Pt states she has been "crabby patty since starting medication." Please advise. Ottis Stain, CMA

## 2017-09-09 NOTE — Telephone Encounter (Signed)
Pt informed with good understanding. Pt very appreciative and asked me to tell Dr. Juleen China "thank you" for her.Ottis Stain, CMA

## 2017-09-09 NOTE — Progress Notes (Signed)
Please call patient to let her know that her thyroid levels were actually low this time. She may be on slightly too high of a dose of Synthroid. Therefore, I have decreased her Synthroid to 112 mcg from 125 mcg. She should start taking the new dose as soon as possible. Please return in 6 weeks for repeat thyroid lab.   Phill Myron, D.O. 09/09/2017, 9:18 AM PGY-3, [place]

## 2017-09-09 NOTE — Telephone Encounter (Signed)
Starting tomorrow, one week after start of Cymbalta, she can increase to 60 mg daily. She can take two tablets of the 30 mg prescription I initially sent in. I will send in a new Rx for 60 mg that she can pick up once she runs out of her current prescription.   Phill Myron, D.O. 09/09/2017, 2:28 PM PGY-3, [place]

## 2017-09-18 ENCOUNTER — Telehealth: Payer: Self-pay | Admitting: Internal Medicine

## 2017-09-18 NOTE — Telephone Encounter (Signed)
Pt left VM on front office line requesting a change in her medication dosage. Pt would like to take Cymbalta twice a day at 30 MG a piece instead of taking it once a day at 60 MG. Please call pt to discuss and see if this is possible. Pleas advise

## 2017-09-19 ENCOUNTER — Other Ambulatory Visit: Payer: Self-pay | Admitting: Endocrinology

## 2017-09-22 ENCOUNTER — Other Ambulatory Visit: Payer: Self-pay | Admitting: Internal Medicine

## 2017-09-22 MED ORDER — DULOXETINE HCL 30 MG PO CPEP: 30.0000 mg | ORAL_CAPSULE | Freq: Two times a day (BID) | ORAL | 3 refills | Status: DC

## 2017-09-22 NOTE — Telephone Encounter (Signed)
Pt informed of below.Katharina Caper, April D, CMA    Have sent in new Rx for Cymbalta.   Phill Myron, D.O. 09/22/2017, 2:01 PM PGY-3, [facility]      Documentation

## 2017-09-22 NOTE — Telephone Encounter (Signed)
She can take Cymbalta 60 mg daily but I would recommend taking two tablets at once instead of one tablet twice per day.   Phill Myron, D.O. 09/22/2017, 8:35 AM PGY-3, [place]

## 2017-09-22 NOTE — Telephone Encounter (Signed)
Confirmed with Dr. Juleen China about the message below and told her that pt is already taking the 60mg  dose and would like to split that into #2 doses of 30 mg.  Informed pt that she could cut the tablet in two and she said that it couldn't be cut in half and that she would like an Rx of 30 mg tablets to take twice a day.  Routing to PCP. Katharina Caper, April D, [place]

## 2017-09-22 NOTE — Progress Notes (Signed)
Have sent in new Rx for Cymbalta.   Phill Myron, D.O. 09/22/2017, 2:01 PM PGY-3, [place]

## 2017-09-23 ENCOUNTER — Telehealth: Payer: Self-pay

## 2017-09-23 NOTE — Telephone Encounter (Signed)
Received fax from [place] requesting prior authorization of Duloxetine 30 mg BID. Form placed in MD's box for completion. Danley Danker, RN [facility] RN)

## 2017-09-24 NOTE — Telephone Encounter (Signed)
Reviewed, completed, and signed form.  Note routed to RN team inbasket and placed completed form in Clinic RN's office (wall pocket above desk).  Ardith Test L Abbigael Detlefsen, DO  

## 2017-09-24 NOTE — Telephone Encounter (Signed)
Completed PA info into CoverMyMeds for Duloxetine.  Status pending. Will recheck status in 24 hours.Danley Danker, RN [facility] RN)

## 2017-09-25 ENCOUNTER — Other Ambulatory Visit: Payer: Self-pay

## 2017-09-25 ENCOUNTER — Encounter: Payer: Self-pay | Admitting: Family Medicine

## 2017-09-25 ENCOUNTER — Observation Stay (HOSPITAL_COMMUNITY)
Admission: AD | Admit: 2017-09-25 | Discharge: 2017-09-26 | Disposition: A | Discharge status: Home / Self Care | Payer: 59 | Source: Ambulatory Visit | Attending: Family Medicine | Admitting: Family Medicine

## 2017-09-25 ENCOUNTER — Ambulatory Visit (INDEPENDENT_AMBULATORY_CARE_PROVIDER_SITE_OTHER): Payer: 59 | Admitting: Family Medicine

## 2017-09-25 DIAGNOSIS — M797 Fibromyalgia: Secondary | ICD-10-CM | POA: Diagnosis not present

## 2017-09-25 DIAGNOSIS — G43709 Chronic migraine without aura, not intractable, without status migrainosus: Secondary | ICD-10-CM | POA: Diagnosis not present

## 2017-09-25 DIAGNOSIS — Z87891 Personal history of nicotine dependence: Secondary | ICD-10-CM | POA: Diagnosis not present

## 2017-09-25 DIAGNOSIS — Z79899 Other long term (current) drug therapy: Secondary | ICD-10-CM | POA: Insufficient documentation

## 2017-09-25 DIAGNOSIS — Z8673 Personal history of transient ischemic attack (TIA), and cerebral infarction without residual deficits: Secondary | ICD-10-CM | POA: Diagnosis not present

## 2017-09-25 DIAGNOSIS — K219 Gastro-esophageal reflux disease without esophagitis: Secondary | ICD-10-CM | POA: Diagnosis not present

## 2017-09-25 DIAGNOSIS — E039 Hypothyroidism, unspecified: Secondary | ICD-10-CM | POA: Insufficient documentation

## 2017-09-25 DIAGNOSIS — R200 Anesthesia of skin: Secondary | ICD-10-CM | POA: Diagnosis present

## 2017-09-25 DIAGNOSIS — J4520 Mild intermittent asthma, uncomplicated: Secondary | ICD-10-CM | POA: Insufficient documentation

## 2017-09-25 DIAGNOSIS — Z79890 Hormone replacement therapy (postmenopausal): Secondary | ICD-10-CM | POA: Insufficient documentation

## 2017-09-25 DIAGNOSIS — F39 Unspecified mood [affective] disorder: Secondary | ICD-10-CM | POA: Diagnosis present

## 2017-09-25 DIAGNOSIS — I10 Essential (primary) hypertension: Secondary | ICD-10-CM | POA: Diagnosis present

## 2017-09-25 DIAGNOSIS — E119 Type 2 diabetes mellitus without complications: Secondary | ICD-10-CM | POA: Diagnosis not present

## 2017-09-25 DIAGNOSIS — F329 Major depressive disorder, single episode, unspecified: Secondary | ICD-10-CM | POA: Insufficient documentation

## 2017-09-25 DIAGNOSIS — G459 Transient cerebral ischemic attack, unspecified: Secondary | ICD-10-CM | POA: Diagnosis present

## 2017-09-25 LAB — COMPREHENSIVE METABOLIC PANEL
ALT: 14 U/L (ref 14–54)
AST: 19 U/L (ref 15–41)
Albumin: 3.4 g/dL — ABNORMAL LOW (ref 3.5–5.0)
Alkaline Phosphatase: 79 U/L (ref 38–126)
Anion gap: 11 (ref 5–15)
BUN: 12 mg/dL (ref 6–20)
CO2: 23 mmol/L (ref 22–32)
Calcium: 8.8 mg/dL — ABNORMAL LOW (ref 8.9–10.3)
Chloride: 104 mmol/L (ref 101–111)
Creatinine, Ser: 0.95 mg/dL (ref 0.44–1.00)
GFR calc Af Amer: >60 mL/min (ref >60)
GFR calc non Af Amer: >60 mL/min (ref >60)
Glucose, Bld: 178 mg/dL — ABNORMAL HIGH (ref 65–99)
Potassium: 3.7 mmol/L (ref 3.5–5.1)
Sodium: 138 mmol/L (ref 135–145)
Total Bilirubin: 0.4 mg/dL (ref 0.3–1.2)
Total Protein: 6.3 g/dL — ABNORMAL LOW (ref 6.5–8.1)

## 2017-09-25 LAB — CBC
HCT: 41.3 % (ref 36.0–46.0)
Hemoglobin: 13.3 g/dL (ref 12.0–15.0)
MCH: 26.9 pg (ref 26.0–34.0)
MCHC: 32.2 g/dL (ref 30.0–36.0)
MCV: 83.4 fL (ref 78.0–100.0)
Platelets: 269 10*3/uL (ref 150–400)
RBC: 4.95 MIL/uL (ref 3.87–5.11)
RDW: 14.6 % (ref 11.5–15.5)
WBC: 9.6 10*3/uL (ref 4.0–10.5)

## 2017-09-25 LAB — GLUCOSE, CAPILLARY: Glucose-Capillary: 179 mg/dL — ABNORMAL HIGH (ref 65–99)

## 2017-09-25 MED ORDER — ACETAMINOPHEN 325 MG PO TABS
650.0000 mg | ORAL_TABLET | ORAL | Status: DC | PRN
  Administered 2017-09-25: 650 mg via ORAL
  Filled 2017-09-25: qty 2

## 2017-09-25 MED ORDER — INSULIN ASPART 100 UNIT/ML ~~LOC~~ SOLN
0.0000 [IU] | Freq: Three times a day (TID) | SUBCUTANEOUS | Status: DC
Start: 2017-09-26 — End: 2017-09-26

## 2017-09-25 MED ORDER — FAMOTIDINE 20 MG PO TABS
20.0000 mg | ORAL_TABLET | Freq: Every day | ORAL | Status: DC
  Administered 2017-09-25 – 2017-09-26 (×2): 20 mg via ORAL
  Filled 2017-09-25 (×2): qty 1

## 2017-09-25 MED ORDER — STROKE: EARLY STAGES OF RECOVERY BOOK
Freq: Once | Status: AC
  Administered 2017-09-25: 22:00:00

## 2017-09-25 MED ORDER — SUMATRIPTAN SUCCINATE 50 MG PO TABS
50.0000 mg | ORAL_TABLET | ORAL | Status: DC | PRN
  Administered 2017-09-25: 50 mg via ORAL
  Filled 2017-09-25 (×2): qty 1

## 2017-09-25 MED ORDER — DULOXETINE HCL 30 MG PO CPEP
30.0000 mg | ORAL_CAPSULE | Freq: Two times a day (BID) | ORAL | Status: DC
  Administered 2017-09-25 – 2017-09-26 (×2): 30 mg via ORAL
  Filled 2017-09-25 (×2): qty 1

## 2017-09-25 MED ORDER — LEVOTHYROXINE SODIUM 112 MCG PO TABS: 112.0000 ug | ORAL_TABLET | Freq: Every day | ORAL | Status: DC

## 2017-09-25 MED ORDER — SENNOSIDES-DOCUSATE SODIUM 8.6-50 MG PO TABS
1.0000 | ORAL_TABLET | Freq: Two times a day (BID) | ORAL | Status: DC
  Administered 2017-09-26: 1 via ORAL
  Filled 2017-09-25 (×2): qty 1

## 2017-09-25 MED ORDER — TOPIRAMATE ER 100 MG PO SPRINKLE CAP24
100.0000 mg | EXTENDED_RELEASE_CAPSULE | Freq: Every day | ORAL | Status: DC
  Filled 2017-09-25: qty 1

## 2017-09-25 MED ORDER — LEVOTHYROXINE SODIUM 112 MCG PO TABS
112.0000 ug | ORAL_TABLET | Freq: Every day | ORAL | Status: DC
  Administered 2017-09-26: 112 ug via ORAL
  Filled 2017-09-25: qty 1

## 2017-09-25 MED ORDER — ACETAMINOPHEN 650 MG RE SUPP: 650.0000 mg | RECTAL | Status: DC | PRN

## 2017-09-25 MED ORDER — ACETAMINOPHEN 160 MG/5ML PO SOLN: 650.0000 mg | ORAL | Status: DC | PRN

## 2017-09-25 MED ORDER — RIZATRIPTAN BENZOATE 10 MG PO TBDP: 10.0000 mg | ORAL_TABLET | Freq: Once | ORAL | Status: DC | PRN

## 2017-09-25 MED ORDER — LINACLOTIDE 145 MCG PO CAPS
145.0000 ug | ORAL_CAPSULE | Freq: Every day | ORAL | Status: DC
Start: 2017-09-26 — End: 2017-09-26
  Administered 2017-09-26: 145 ug via ORAL
  Filled 2017-09-25: qty 1

## 2017-09-25 NOTE — Telephone Encounter (Signed)
Prior approval for Duloxetine 30 mg BID completed via CoverMyMeds. Med approved for 09/24/17 - 09/24/2018.  Prior approval # [id number].  [place] pharmacy informed.  Danley Danker, RN [facility] RN)

## 2017-09-25 NOTE — Progress Notes (Signed)
Pt complains of oral numbness/ tingling, "strange feeling" on the crown of her head, and dizziness when looking left

## 2017-09-25 NOTE — Patient Instructions (Signed)
We will directly admit you to the hospital.  Please go home now and wait for someone to call

## 2017-09-25 NOTE — Assessment & Plan Note (Signed)
Part of a constillation of symptoms that best fits a diff dx rather than a single dx.   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 My most worrisome dx is stroke or TIA.  Plan is to admit for expedited Stroke?TIA work up.  She is outside the window for intervention.  As such, I will try to avoid the ER, she will go home and wait for bed control to call for direct admit.

## 2017-09-25 NOTE — Progress Notes (Signed)
Pt arrived to unit

## 2017-09-25 NOTE — H&P (Addendum)
[facility] Admission History and Physical Service Pager: [phone]  Patient name: Kendra Hall Medical record number: [id number] Date of birth: [birth year] Age: 52 y.o. Gender: female  Primary Care Provider: Nicolette Bang, DO Consultants: None Code Status: Full  Chief Complaint: L sided facial numbness and tingling  Assessment and Plan: Kendra Hall is a 52 y.o. female presenting with 2 week h/o dizziness, worsening today with new onset of L sided facial numbness. PMH is significant for Hypothyroidism, DM2, GERD, MDD, Fibromyalgia, Asthma, h/o migraines.  Left facial numbness, dizziness Patient seen in clinic today for ongoing episodic dizziness with nystagmus noted on exam. Patient endorsed new onset L sided facial numbness today with concern for possible stroke. Most likely due to BPPV due to symptoms and exam in clinic, however with some 4/5 L sided weakness noted on exam on admission, concerning for possible stroke. Could also be complex migraine. Passed bedside swallow, will order regular diet.  Patient clearly outside the TPA window if her symptoms happen to be due to stroke.  - admit to med-surg, attending Dr. Erin Hearing - MRI/MRA Head - CT Head - vitals per floor routine - PT/OT - Continuous pulse ox - TSH, CBC, CMP, lipid panel - consider neuro consult if abnormalities on imaging - consider ECHO - neuro checks  Hypothyroidism s/p Grave's dz with radiotherapy Last TSH 0.057 09/03/2017. Home meds: Synthroid 134mcg. - TSH  T2DM Last A1c 6.0 08/06/2017. Was previously on insulin for only 6 months. Has now been off of insulin for some time. Takes trulicity on tuesdays. Doesn't take Victoza when her sugars are low. - A1c - sensitive SSI - CBG monitoring AC & HS  GERD Well controlled on Zantac 150mg  at home - monitor - continue home med  MDD  On cymbalta 30mg  BID. Mood stable. - continue home med.  Fibromyalgia Reports she was  on Cymbalta 30mg  daily, states dizzy spell started when she went up on dose (60 once daily), then split dose to 30mg  BID and hasn't had issues. Hasn't taken in 3 days. Previously was on nortriptyline and lexapro. - continue home Cymbalta  Asthma Albuterol PRN, last used last week.  FEN/GI: regular diet Prophylaxis: SCDs  Disposition: admit to med-surg, attending Dr. Erin Hearing  History of Present Illness:  Kendra Hall is a 52 y.o. female presenting with 2 weeks of episodic dizziness and nausea that got better and then came back 3 days ago. Today started to have shaking, dizziness, was hot, and noticed she couldn't concentrate on work or what she was saying and got scared. She states her L arm gets a tingling sensation when she goes to sleep even when she isn't laying on it. Puts a pillow underneath it or lays on other side and it helps.   Coming to the hospital as a direct admit from clinic due to new onset L sided facial numbness and tingling with associated dizziness. In 2016, she was told she had a TIA, but found out today it was instead a migraine with aura. Typically before migraines she will see flashing silver lights. She does ice packs for relief. Neurologist put on topamax 6 months ago, and doesn't migraines get as often. Her migraines are typically intense pain over her whole head with associated photophobia, phonophobia, and nausea.  She states her dizzines wakes her up out of bed, and makes her feel like she is going to fall out of the bed. No loss of consciousness or fainting. No loss  of bladder or bowel control. No seizures. Walks slow due to dizziness, acting like herself per family.  Review Of Systems: Per HPI with the following additions:   Review of Systems  Constitutional: Negative for chills and fever.  Eyes:       States she feels like her eyes are throbbing  Gastrointestinal: Positive for nausea. Negative for constipation, diarrhea and vomiting.  Neurological: Positive  for dizziness, tingling and headaches. Negative for seizures and loss of consciousness.    Patient Active Problem List   Diagnosis Date Noted  . Lt facial numbness 09/25/2017  . Back pain 06/15/2017  . Postoperative state 03/11/2017  . Trigger point 11/28/2016  . Health care maintenance 02/04/2016  . Severe episode of recurrent major depressive disorder, with psychotic features ([place]) 08/22/2015  . GAD (generalized anxiety disorder) 08/22/2015  . PTSD (post-traumatic stress disorder) 08/22/2015  . Insomnia 08/22/2015  . GERD (gastroesophageal reflux disease) 04/18/2015  . Fibromyalgia 03/29/2015  . Type 2 diabetes mellitus without complication ([place])   . TIA (transient ischemic attack) 03/15/2015  . Mild intermittent asthma 02/28/2012  . Anxiety disorder 02/07/2012  . Hypertension 01/07/2012  . Hypothyroidism following radioiodine therapy 08/28/2011  . Grave's disease 05/07/2011  . Family hx-breast malignancy 11/20/2010  . Chronic migraine without aura 05/04/2010  . TOBACCO USER 03/11/2009  . Notalgia 01/09/2009  . OBESITY, UNSPECIFIED 09/27/2008  . Mood disorder ([place]) 09/22/2008    Past Medical History: Past Medical History:  Diagnosis Date  . Allergy   . Anemia    history of  . Anxiety   . Asthma   . Depression   . Diabetes mellitus   . Fibromyalgia   . Gastric ulcer   . GERD (gastroesophageal reflux disease)   . Grave's disease   . Graves disease   . H/O therapeutic radiation   . Migraine   . Multiple thyroid nodules   . Myofacial muscle pain   . Osteoarthritis    Facet hypertrophy with injections  . TIA (transient ischemic attack)    02/2015    Past Surgical History: Past Surgical History:  Procedure Laterality Date  . AXILLARY LYMPH NODE DISSECTION  2001  . HYDRADENITIS EXCISION    . TUBAL LIGATION  1995  . VAGINAL HYSTERECTOMY Left 03/11/2017   Procedure: HYSTERECTOMY VAGINAL W/LEFT PROXIMAL SALPINGECTOMY;  Surgeon: Princess Bruins, MD;  Location:  [facility];  Service: Gynecology;  Laterality: Left;  request 7:30am OR time  request 2 hours      Social History: Social History   Tobacco Use  . Smoking status: Former Smoker    Packs/day: 0.50    Years: 30.00    Pack years: 15.00    Types: Cigarettes  . Smokeless tobacco: Never Used  Substance Use Topics  . Alcohol use: No    Comment: rare  . 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  Today denies all drug abuse   Additional social history:  Please also refer to relevant sections of EMR.  Family History: Family History  Problem Relation Age of Onset  . Hypertension Mother   . Diabetes Mother   . Cancer Mother        rectal  . Heart disease Father 36       MI x5  . Hypertension Father   . Diabetes Father   . Cancer Sister 73       breast  .  Drug abuse Brother   . Alcohol abuse Brother   . Drug abuse Brother   . Alcohol abuse Brother   . Anxiety disorder Neg Hx   . Bipolar disorder Neg Hx   . Depression Neg Hx    Allergies and Medications: Allergies  Allergen Reactions  . Ergotrate [Ergonovine] Other (See Comments)    Pt does not ever want  . Ibuprofen Other (See Comments)    Patient has ulcer  . Metformin And Related Other (See Comments)    Dizziness  . Yutopar [Ritodrine] Other (See Comments)    Pt does not ever want   No current facility-administered medications on file prior to encounter.    Current Outpatient Medications on File Prior to Encounter  Medication Sig Dispense Refill  . albuterol (PROAIR HFA) 108 (90 Base) MCG/ACT inhaler Inhale 2 puffs into the lungs every 4 (four) hours as needed for wheezing. 1 Inhaler 2  . benzonatate (TESSALON) 100 MG capsule Take 1 capsule (100 mg total) by mouth 2 (two) times daily as needed for cough. 20 capsule 0  . Blood Glucose Calibration (ONETOUCH VERIO) SOLN Used to check meter accuracy 1 each 0  . Dulaglutide (TRULICITY) 1.5  FU/9.3AT SOPN Inject 1.5 mg weekly. 4 pen 11  . DULoxetine (CYMBALTA) 30 MG capsule Take 1 capsule (30 mg total) by mouth 2 (two) times daily. 180 capsule 3  . glucose blood (ONETOUCH VERIO) test strip 1 each by Other route 4 (four) times daily. And lancets 4/day 450 each 12  . levothyroxine (SYNTHROID, LEVOTHROID) 112 MCG tablet Take 1 tablet (112 mcg total) by mouth daily. 90 tablet 3  . linaclotide (LINZESS) 145 MCG CAPS capsule Take 145 mcg by mouth daily before breakfast.    . metaxalone (SKELAXIN) 400 MG tablet Take 2 tablets (800 mg total) by mouth 3 (three) times daily. 60 tablet 1  . naproxen (NAPROSYN) 500 MG tablet TAKE 1 TABLET(500 MG) BY MOUTH EVERY 12 HOURS AS NEEDED 180 tablet 2  . nortriptyline (PAMELOR) 25 MG capsule TAKE 3 CAPSULES(75 MG) BY MOUTH AT BEDTIME 270 capsule 3  . ondansetron (ZOFRAN) 4 MG tablet Take 1 tablet (4 mg total) by mouth every 8 (eight) hours as needed for nausea or vomiting. 20 tablet 2  . Polyethyl Glycol-Propyl Glycol (SYSTANE OP) Apply 1-2 drops to eye daily as needed (dryness).    . ranitidine (ZANTAC) 150 MG tablet Take 1 tablet (150 mg total) by mouth 2 (two) times daily. 60 tablet 1  . rizatriptan (MAXALT-MLT) 10 MG disintegrating tablet Take 1 tablet earliest onset of headache.  May repeat once in 2 hours if needed 9 tablet 11  . Topiramate ER (TROKENDI XR) 100 MG CP24 Take 1 capsule by mouth at bedtime.    . Topiramate ER (TROKENDI XR) 100 MG CP24 Take 100 mg by mouth daily. 30 capsule 6  . VICTOZA 18 MG/3ML SOPN ADMINISTER 1.2 MG UNDER THE SKIN DAILY 9 mL 0    Objective: BP (!) 145/84 (BP Location: Right Arm)   Pulse 85   Temp 99.1 F (37.3 C)   LMP 12/06/2016 (Approximate)   SpO2 99%  Exam: General: lying in bed, conversing easily, in NAD Eyes: PERRL, EOMI ENTM: MMM, dentition wnl. Neck: ROM grossly intact Cardiovascular: RRR, no murmur Respiratory: CTAB, no wheezes, rales, rhonchi Gastrointestinal: soft, NTND, +S MSK: ROM grossly  intact, strength 4/5 to L U/LE 5/5 R U/LE, No edema.  Neuro: Alert and oriented, speech normal. Romberg negative. Optic field normal.  PERRL, Extraocular movements intact. Intact symmetric sensation to light touch of face. States R arm feels "heavier" when palpated lightly. Hearing grossly intact bilaterally.  Tongue protrudes normally with no deviation. Shoulder shrug, smile symmetric. Derm: no rashes or lesions appreciated Psych: mood and affect appropriate  Labs and Imaging: CBC BMET  No results for input(s): WBC, HGB, HCT, PLT in the last 168 hours. No results for input(s): NA, K, CL, CO2, BUN, CREATININE, GLUCOSE, CALCIUM in the last 168 hours.   No results found.  Rory Percy, DO 09/25/2017, 7:10 PM PGY-1, [place] Intern pager: [phone], text pages welcome  I have seen and evaluated the patient with Dr. Ky Barban. I am in agreement with the note above in its revised form. My additions are in red.  Wendee Beavers, MD, PGY-2 09/25/2017 10:37 PM

## 2017-09-25 NOTE — Progress Notes (Signed)
   Subjective:    Patient ID: Kendra Hall, female    DOB: 1966-01-22, 52 y.o.   MRN: [id number]  HPI Difficult hx to piece together.  Patient began talking about room spinning dizziness.  Had a couple episodes 2 weeks ago.  Then quiet, then multiple episodes the last 2-3 days.  Does not sound orthostatic - happened while lying down.  Then she adds that she has tingling on the left side of her face which began ~6 hours ago.  No weakness, slurred speech, difficulty swallowing. Does endorse nausea and headache.  Hx of migraine with aura.  Aura is typically flashing lights.   States she had a TIA last year.  Reviewed records 02/2015 had admit for headache and left hemiparesis.  After workup, thought to not be a TIA.  Thought to be migraine with aura   Review of Systems     Objective:   Physical Exam  VS noted and stable. HEENT. 2-4 beats of nystagmus on lateral gaze. Decreased sensation left face - does not involve forehead Motor strength of face and extremities normal Walks cautiously but no noted weakness        Assessment & Plan:  Headache, nausea, dizziness and weakness in patient with hx of migraine with aura.  Diff dx. I believe most likely that she has BPPV and migraine with aura.  However, TIA or multiple TIAs cannot be ruled out.  Admit to hospital for expedited TIA work up.  If neg, will need referral to neurovestibular rehab as an outpatient.

## 2017-09-26 ENCOUNTER — Inpatient Hospital Stay (HOSPITAL_COMMUNITY): Payer: 59

## 2017-09-26 ENCOUNTER — Other Ambulatory Visit: Payer: Self-pay

## 2017-09-26 ENCOUNTER — Telehealth: Payer: Self-pay | Admitting: *Deleted

## 2017-09-26 ENCOUNTER — Encounter (HOSPITAL_COMMUNITY): Payer: Self-pay | Admitting: *Deleted

## 2017-09-26 DIAGNOSIS — G43709 Chronic migraine without aura, not intractable, without status migrainosus: Secondary | ICD-10-CM | POA: Diagnosis not present

## 2017-09-26 DIAGNOSIS — R200 Anesthesia of skin: Secondary | ICD-10-CM | POA: Diagnosis not present

## 2017-09-26 LAB — HEMOGLOBIN A1C
Hgb A1c MFr Bld: 6.2 % — ABNORMAL HIGH (ref 4.8–5.6)
Mean Plasma Glucose: 131.24 mg/dL

## 2017-09-26 LAB — TSH: TSH: 0.219 u[IU]/mL — ABNORMAL LOW (ref 0.350–4.500)

## 2017-09-26 LAB — GLUCOSE, CAPILLARY
Glucose-Capillary: 131 mg/dL — ABNORMAL HIGH (ref 65–99)
Glucose-Capillary: 101 mg/dL — ABNORMAL HIGH (ref 65–99)

## 2017-09-26 LAB — HIV ANTIBODY (ROUTINE TESTING W REFLEX): HIV Screen 4th Generation wRfx: NONREACTIVE

## 2017-09-26 MED ORDER — ONDANSETRON HCL 4 MG/2ML IJ SOLN
4.0000 mg | Freq: Three times a day (TID) | INTRAMUSCULAR | Status: DC | PRN
  Administered 2017-09-26: 4 mg via INTRAVENOUS
  Filled 2017-09-26: qty 2

## 2017-09-26 NOTE — Progress Notes (Signed)
Family Medicine Teaching Service Daily Progress Note Intern Pager: [phone]  Patient name: Kendra Hall Medical record number: [id number] Date of birth: 10/20/1965 Age: 52 y.o. Gender: female  Primary Care Provider: Nicolette Bang, DO Consultants: none Code Status: full  Pt Overview and Major Events to Date:  4/4 admitted  Assessment and Plan: PUNEET SELDEN is a 52 y.o. female presenting with 2 week h/o dizziness, worsening on 4/4 with new onset of L sided facial numbness. PMH is significant for Hypothyroidism, DM2, GERD, MDD, Fibromyalgia, Asthma, h/o migraines.  Left facial numbness, dizziness Patient sent from clinic for stroke ruleout, symptoms likely due to complex migraine vs conversion disorder.  Patient was outside the TPA window if her symptoms happen to be due to stroke.  Labs unremarkable except for mildly reduced TSH.  CT head negative.  MRI/MRA head performed this morning, awaiting read. - f/u MRI/MRA Head - PT/OT - consider neuro consult if abnormalities on imaging - consider ECHO - neuro checks  Hypothyroidism s/p Grave's dz with radiotherapy Last TSH 0.057 09/03/2017 > 0.219 on 4/4, so it is normalizing. Home meds: Synthroid 179mcg. - outpatient follow up  T2DM Last A1c 6.0 08/06/2017. Was previously on insulin for only 6 months. Has now been off of insulin for some time. Takes trulicity on tuesdays. Doesn't take Victoza when her sugars are low. CBGs in mid 100s during admission. - A1c - sensitive SSI - CBG monitoring AC & HS  GERD Well controlled on Zantac 150mg  at home - monitor - continue home med  MDD  On cymbalta 30mg  BID. Mood stable. - continue home med.  Fibromyalgia Reports she was on Cymbalta 30mg  daily, states dizzy spell started when she went up on dose (60 once daily), then split dose to 30mg  BID and hasn't had issues. Hasn't taken in 3 days. Previously was on nortriptyline and lexapro. - continue home  Cymbalta  Asthma Albuterol PRN, last used last week.  FEN/GI: regular diet Prophylaxis: SCDs  Disposition: home  Subjective:  Says she feels a little better this morning, noting that her "eyes are less jumpy."  Does not think that she had a stroke.  No headache currently, but did have one last night, saying that "I felt like something was in my head."  Endorses nausea but does not mention numbness.  When asked about numbness, says that she feels a little numb on left side.  Objective: Temp:  [98.3 F (36.8 C)-99.1 F (37.3 C)] 98.3 F (36.8 C) (04/05 0452) Pulse Rate:  [75-85] 78 (04/05 0452) Resp:  [18-20] 18 (04/05 0452) BP: (125-161)/(63-84) 147/74 (04/05 0452) SpO2:  [99 %-100 %] 100 % (04/05 0452) Weight:  [196 lb (88.9 kg)] 196 lb (88.9 kg) (04/04 1118) Physical Exam: General: alert, oriented x 3 HEENT: some nystagmus noted Cardiovascular: RRR, no MRG Respiratory: CTAB Abdomen: soft, nontender Extremities: 4/5 strength on left side Neurology: 4/5 strength throughout left side, able to pull herself up and ambulate across room, limp noted on left side  Laboratory: Recent Labs  Lab 09/25/17 2222  WBC 9.6  HGB 13.3  HCT 41.3  PLT 269   Recent Labs  Lab 09/25/17 2222  NA 138  K 3.7  CL 104  CO2 23  BUN 12  CREATININE 0.95  CALCIUM 8.8*  PROT 6.3*  BILITOT 0.4  ALKPHOS 79  ALT 14  AST 19  GLUCOSE 178*    Imaging/Diagnostic Tests: Ct Head Wo Contrast  Result Date: 09/26/2017 CLINICAL DATA:  Dizziness and  left facial numbness EXAM: CT HEAD WITHOUT CONTRAST TECHNIQUE: Contiguous axial images were obtained from the base of the skull through the vertex without intravenous contrast. COMPARISON:  Brain MRI 03/15/2015 FINDINGS: Brain: No mass lesion, intraparenchymal hemorrhage or extra-axial collection. No evidence of acute cortical infarct. Normal appearance of the brain parenchyma and extra axial spaces for age. Vascular: No hyperdense vessel or unexpected  vascular calcification. Skull: Normal visualized skull base, calvarium and extracranial soft tissues. Sinuses/Orbits: No sinus fluid levels or advanced mucosal thickening. No mastoid effusion. Normal orbits. IMPRESSION: Normal head CT. Electronically Signed   By: Ulyses Jarred M.D.   On: 09/26/2017 02:12     Kathrene Alu, MD 09/26/2017, 6:54 AM PGY-1, [place] Intern pager: [phone], text pages welcome

## 2017-09-26 NOTE — Discharge Summary (Signed)
[facility] Discharge Summary  Patient name: Kendra Hall Medical record number: [id number] Date of birth: [birth year] Age: 52 y.o. 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 Darnell was admitted on 09/25/17 after presenting to clinic with left facial numbness that began earlier that day after having episodic dizziness and nystagmus for the last two weeks.  Left sided weakness was noted on exam, so she ws sent to the ED for a stroke rule out.  She was outside the window for TPA, but a CT was performed and found to be normal.  Labs were unremarkable except for a slightly reduced TSH.  Her symptoms were improved on 4/5, and MRI brain was negative.  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  She was discharged on 4/5 after working with vestibular rehab.    Issues for Follow Up:  1. Patient's MRI was significant for metabolic bone disease.  Further workup could be considered outpatient. 2. Patient's dose of synthroid may need adjusting since her TSH was low, although it was improved from 09/03/17.   Significant Procedures:  none  Significant Labs and Imaging:  Recent Labs  Lab 09/25/17 2222  WBC 9.6  HGB 13.3  HCT 41.3  PLT 269   Recent Labs  Lab 09/25/17 2222  NA 138  K 3.7  CL 104  CO2 23  GLUCOSE 178*  BUN 12  CREATININE 0.95  CALCIUM 8.8*  ALKPHOS 79  AST 19  ALT 14  ALBUMIN 3.4*    Ct Head Wo Contrast  Result Date: 09/26/2017 CLINICAL DATA:  Dizziness and left facial numbness EXAM: CT HEAD WITHOUT CONTRAST TECHNIQUE: Contiguous axial images were obtained from the base of the skull through the vertex without intravenous contrast. COMPARISON:  Brain MRI 03/15/2015 FINDINGS: Brain: No mass lesion, intraparenchymal hemorrhage or extra-axial collection. No evidence of acute cortical infarct. Normal appearance of the brain parenchyma and extra axial spaces for age. Vascular: No hyperdense vessel or unexpected vascular calcification. Skull: Normal visualized skull base, calvarium and extracranial soft tissues. Sinuses/Orbits: No sinus fluid levels or advanced mucosal thickening. No mastoid effusion. Normal orbits. IMPRESSION: Normal head CT. Electronically Signed   By: Ulyses Jarred M.D.   On: 09/26/2017 02:12   Mr Jodene Nam Head Wo Contrast  Result Date: 09/26/2017 CLINICAL DATA:  Left facial numbness and tingling EXAM: MRI HEAD WITHOUT CONTRAST MRA HEAD WITHOUT CONTRAST TECHNIQUE: Multiplanar, multiecho pulse sequences of the brain and surrounding structures were obtained without intravenous contrast. Angiographic images of the head were obtained using MRA technique without contrast. COMPARISON:  CT head 09/26/2017, MRI 03/15/2015 FINDINGS: MRI HEAD FINDINGS Brain: Negative for acute infarct. Scattered small white matter hyperintensities primarily in the subcortical white matter similar to 2016. Brainstem and cerebellum normal. Negative for hemorrhage or  mass. No fluid collection or midline shift. Vascular: Normal arterial flow voids. Skull and upper cervical spine: Abnormal bone marrow signal in the cervical  spine diffusely with patchy areas of low signal on T1 in all the vertebral bodies visualized down to C4. This appears to extend also into the posterior elements. No fracture. Bone marrow was abnormal in 2016 with a similar pattern, possibly with some progression. Currently the patient has a normal CBC. Skull base normal.  Remainder the skull normal. Sinuses/Orbits: Negative Other: None MRA HEAD FINDINGS Both vertebral arteries are patent to the basilar. Hypoplastic distal left vertebral artery. Basilar widely patent. PICA patent bilaterally. Superior cerebellar and posterior cerebral arteries patent bilaterally. Posterior communicating artery patent bilaterally. Internal carotid artery patent bilaterally without stenosis or aneurysm. Anterior and middle cerebral arteries widely patent and normal bilaterally. Negative for cerebral aneurysm. IMPRESSION: Negative for acute infarct. Chronic white matter disease unchanged from 02/16 2016 and most likely due to chronic microvascular ischemia Negative MRA head Abnormal bone marrow in the cervical spine. This was present in 2016 and may show mild progression. This pattern could be seen with metastatic disease however the patient has no known malignancy and relative stability over 2 and half years suggests this may be a benign process. Possible metabolic bone disease or prominent red marrow pattern. Consider follow-up CT cervical spine or bone scan for further evaluation. Electronically Signed   By: Franchot Gallo M.D.   On: 09/26/2017 10:47   Mr Brain Wo Contrast  Result Date: 09/26/2017 CLINICAL DATA:  Left facial numbness and tingling EXAM: MRI HEAD WITHOUT CONTRAST MRA HEAD WITHOUT CONTRAST TECHNIQUE: Multiplanar, multiecho pulse sequences of the brain and surrounding structures were obtained without intravenous contrast. Angiographic images of the head were obtained using MRA technique without contrast. COMPARISON:  CT head 09/26/2017, MRI 03/15/2015 FINDINGS: MRI  HEAD FINDINGS Brain: Negative for acute infarct. Scattered small white matter hyperintensities primarily in the subcortical white matter similar to 2016. Brainstem and cerebellum normal. Negative for hemorrhage or mass. No fluid collection or midline shift. Vascular: Normal arterial flow voids. Skull and upper cervical spine: Abnormal bone marrow signal in the cervical spine diffusely with patchy areas of low signal on T1 in all the vertebral bodies visualized down to C4. This appears to extend also into the posterior elements. No fracture. Bone marrow was abnormal in 2016 with a similar pattern, possibly with some progression. Currently the patient has a normal CBC. Skull base normal.  Remainder the skull normal. Sinuses/Orbits: Negative Other: None MRA HEAD FINDINGS Both vertebral arteries are patent to the basilar. Hypoplastic distal left vertebral artery. Basilar widely patent. PICA patent bilaterally. Superior cerebellar and posterior cerebral arteries patent bilaterally. Posterior communicating artery patent bilaterally. Internal carotid artery patent bilaterally without stenosis or aneurysm. Anterior and middle cerebral arteries widely patent and normal bilaterally. Negative for cerebral aneurysm. IMPRESSION: Negative for acute infarct. Chronic white matter disease unchanged from 02/16 2016 and most likely due to chronic microvascular ischemia Negative MRA head Abnormal bone marrow in the cervical spine. This was present in 2016 and may show mild progression. This pattern could be seen with metastatic disease however the patient has no known malignancy and relative stability over 2 and half years suggests this may be a benign process. Possible metabolic bone disease or prominent red marrow pattern. Consider follow-up CT cervical spine or bone scan for further evaluation. Electronically Signed   By: Franchot Gallo M.D.   On: 09/26/2017 10:47     Results/Tests  Pending at Time of Discharge: none  Discharge  Medications:  Allergies as of 09/26/2017      Reactions   Ergotrate [ergonovine] Other (See Comments)   Pt does not ever want   Ibuprofen Other (See Comments)   Patient has ulcer   Metformin And Related Other (See Comments)   Dizziness   Yutopar [ritodrine] Other (See Comments)   Pt does not ever want      Medication List    STOP taking these medications   naproxen 500 MG tablet Commonly known as:  NAPROSYN     TAKE these medications   albuterol 108 (90 Base) MCG/ACT inhaler Commonly known as:  PROAIR HFA Inhale 2 puffs into the lungs every 4 (four) hours as needed for wheezing.   benzonatate 100 MG capsule Commonly known as:  TESSALON Take 1 capsule (100 mg total) by mouth 2 (two) times daily as needed for cough. What changed:  when to take this   cetirizine 10 MG tablet Commonly known as:  ZYRTEC Take 10 mg by mouth at bedtime.   Dulaglutide 1.5 MG/0.5ML Sopn Commonly known as:  TRULICITY Inject 1.5 mg weekly. What changed:    how much to take  how to take this  when to take this  additional instructions Notes to patient:  Continue home schedule    DULoxetine 30 MG capsule Commonly known as:  CYMBALTA Take 1 capsule (30 mg total) by mouth 2 (two) times daily.   glucose blood test strip Commonly known as:  ONETOUCH VERIO 1 each by Other route 4 (four) times daily. And lancets 4/day Notes to patient:  continue home schedule   levothyroxine 112 MCG tablet Commonly known as:  SYNTHROID, LEVOTHROID Take 1 tablet (112 mcg total) by mouth daily.   LINZESS 145 MCG Caps capsule Generic drug:  linaclotide Take 145 mcg by mouth daily before breakfast.   metaxalone 400 MG tablet Commonly known as:  SKELAXIN Take 2 tablets (800 mg total) by mouth 3 (three) times daily.   nortriptyline 25 MG capsule Commonly known as:  PAMELOR TAKE 3 CAPSULES(75 MG) BY MOUTH AT BEDTIME   ondansetron 4 MG tablet Commonly known as:  ZOFRAN Take 1 tablet (4 mg total) by mouth  every 8 (eight) hours as needed for nausea or vomiting.   ONETOUCH VERIO Soln Used to check meter accuracy   ranitidine 150 MG tablet Commonly known as:  ZANTAC Take 1 tablet (150 mg total) by mouth 2 (two) times daily.   rizatriptan 10 MG disintegrating tablet Commonly known as:  MAXALT-MLT Take 1 tablet earliest onset of headache.  May repeat once in 2 hours if needed Notes to patient:  continued home schedule    Topiramate ER 100 MG Cp24 Commonly known as:  TROKENDI XR Take 100 mg by mouth daily. Notes to patient:  continue home schedule   VICTOZA 18 MG/3ML Sopn Generic drug:  liraglutide ADMINISTER 1.2 MG UNDER THE SKIN DAILY What changed:  See the new instructions. Notes to patient:  continue home schedule   XIIDRA 5 % Soln Generic drug:  Lifitegrast Place 1 drop into both eyes daily. Notes to patient:  continue home schedule       Discharge Instructions: Please refer to Patient Instructions section of EMR for full details.  Patient was counseled important signs and symptoms that should prompt return to medical care, changes in medications, dietary instructions, activity restrictions, and follow up appointments.   Follow-Up Appointments:   Kathrene Alu, MD 09/26/2017,  8:30 PM PGY-1, [place]

## 2017-09-26 NOTE — Progress Notes (Signed)
Nurse went over discharge with patient . Patient verbalized understanding of discharge. Discharging home with all belongings. Taking down in a wheelchair.

## 2017-09-26 NOTE — Telephone Encounter (Signed)
Received refill request for escitalopram 20mg  tablets but did not see on current med list. Katharina Caper, April D, CMA

## 2017-09-26 NOTE — Progress Notes (Signed)
OT Cancellation Note  Patient Details Name: Kendra Hall MRN: [id number] DOB: Feb 01, 1966   Cancelled Treatment:    Reason Eval/Treat Not Completed: Medical issues which prohibited therapy.  Pt with current bedrest orders  Refael Fulop M 09/26/2017, 10:50 AM

## 2017-09-26 NOTE — Care Management Note (Addendum)
Case Management Note  Patient Details  Name: Kendra Hall MRN: [id number] Date of Birth: 1966/03/10  Subjective/Objective:    Pt in with lt facial numbness.  She is from home with her children.                Action/Plan: Pt discharging home with self care. Pt has PCP, insurance and transportation home.   Addendum: PT recommending outpatient therapy. CM spoke to patient and she will not have the time for outpatient therapy with her work schedule. She asked that therapy provide her with some exercises to work on. PT notified.   Expected Discharge Date:  09/26/17               Expected Discharge Plan:  Home/Self Care  In-House Referral:     Discharge planning Services     Post Acute Care Choice:    Choice offered to:     DME Arranged:    DME Agency:     HH Arranged:    HH Agency:     Status of Service:  Completed, signed off  If discussed at [employer] of Stay Meetings, dates discussed:    Additional Comments:  Pollie Friar, RN 09/26/2017, 2:20 PM

## 2017-09-26 NOTE — Evaluation (Signed)
Occupational Therapy Evaluation Patient Details Name: Kendra Hall MRN: [id number] DOB: Jun 29, 1965 Today's Date: 09/26/2017    History of Present Illness Pt. is a 52 y.o. F with significant PMH of hypothyroidism, DM2, GERD, MDD, fibromyalgia, asthma, and history of migraines. Patient presenting with dizziness and onset of L sided facial numbness.  MRI negative for acute infarct    Clinical Impression   Patient evaluated by Occupational Therapy with no further acute OT needs identified. All education has been completed and the patient has no further questions. Pt appears back to baseline. She is able to perform ADLs independently - no apparent deficits noted.   See below for any follow-up Occupational Therapy or equipment needs. OT is signing off. Thank you for this referral.      Follow Up Recommendations  No OT follow up    Equipment Recommendations  None recommended by OT    Recommendations for Other Services       Precautions / Restrictions Precautions Precautions: Fall      Mobility Bed Mobility Overal bed mobility: Independent                Transfers Overall transfer level: Independent                    Balance                                           ADL either performed or assessed with clinical judgement   ADL Overall ADL's : Independent                                       General ADL Comments: Pt able to simulate shower in standing independently with no LOB.  She is able to retrieve items from floor without LOB      Vision Baseline Vision/History: No visual deficits Patient Visual Report: No change from baseline Vision Assessment?: No apparent visual deficits     Perception Perception Perception Tested?: Yes   Praxis Praxis Praxis tested?: Within functional limits    Pertinent Vitals/Pain Pain Assessment: No/denies pain     Hand Dominance Right   Extremity/Trunk Assessment Upper Extremity  Assessment Upper Extremity Assessment: Overall WFL for tasks assessed   Lower Extremity Assessment Lower Extremity Assessment: Defer to PT evaluation   Cervical / Trunk Assessment Cervical / Trunk Assessment: Normal   Communication Communication Communication: No difficulties   Cognition Arousal/Alertness: Awake/alert Behavior During Therapy: WFL for tasks assessed/performed Overall Cognitive Status: Within Functional Limits for tasks assessed                                     General Comments       Exercises     Shoulder Instructions      Home Living Family/patient expects to be discharged to:: Private residence Living Arrangements: Children Available Help at Discharge: Family Type of Home: Apartment Home Access: Stairs to enter Technical brewer of Steps: 16 Entrance Stairs-Rails: Can reach both Home Layout: One level     Bathroom Shower/Tub: Teacher, early years/pre: Standard     Home Equipment: None          Prior Functioning/Environment Level of Independence: Independent  Comments: Works in Energy manager Problem List: Impaired balance (sitting and/or standing)      OT Treatment/Interventions:      OT Goals(Current goals can be found in the care plan section) Acute Rehab OT Goals Patient Stated Goal: to go home and rest  OT Goal Formulation: All assessment and education complete, DC therapy  OT Frequency:     Barriers to D/C:            Co-evaluation              AM-PAC PT "6 Clicks" Daily Activity     Outcome Measure Help from another person eating meals?: None Help from another person taking care of personal grooming?: None Help from another person toileting, which includes using toliet, bedpan, or urinal?: None Help from another person bathing (including washing, rinsing, drying)?: None Help from another person to put on and taking off regular upper body clothing?: None Help  from another person to put on and taking off regular lower body clothing?: None 6 Click Score: 24   End of Session    Activity Tolerance: Patient tolerated treatment well Patient left: in bed;with call bell/phone within reach  OT Visit Diagnosis: Dizziness and giddiness (R42)                Time: 8676-7209 OT Time Calculation (min): 20 min Charges:  OT General Charges $OT Visit: 1 Visit OT Evaluation $OT Eval Low Complexity: 1 Low G-Codes:     [facility], OTR/L [phone]   Lucille Passy M 09/26/2017, 3:52 PM

## 2017-09-26 NOTE — Evaluation (Addendum)
Physical Therapy Evaluation Patient Details Name: Kendra Hall MRN: [id number] DOB: Nov 20, 1965 Today's Date: 09/26/2017   History of Present Illness  Pt. is a 52 y.o. F with significant PMH of hypothyroidism, DM2, GERD, MDD, fibromyalgia, asthma, and history of migraines. Patient presenting with dizziness and onset of L sided facial numbness. CT and MRI negative for acute changes.  Clinical Impression  Patient evaluated by Physical Therapy with no further acute PT needs identified. All education has been completed and the patient has no further questions. Patient with no current complaint of dizziness with provoking positional changes during mobility; no nystagmus noted. Patient does show mild gait deviations, including decreased weight shift to the left, and high level balance deficits compared to baseline, therefore could benefit from outpatient PT if impairments persist. PT is signing off. Thank you for this referral.     Follow Up Recommendations Outpatient PT    Equipment Recommendations  None recommended by PT    Recommendations for Other Services       Precautions / Restrictions Precautions Precautions: Fall Restrictions Weight Bearing Restrictions: No      Mobility  Bed Mobility Overal bed mobility: Independent                Transfers Overall transfer level: Independent Equipment used: None                Ambulation/Gait Ambulation/Gait assistance: Modified independent (Device/Increase time) Ambulation Distance (Feet): 350 Feet Assistive device: None Gait Pattern/deviations: Decreased weight shift to left Gait velocity: decreased Gait velocity interpretation: Below normal speed for age/gender General Gait Details: Patient with decreased gait speed and decreased weight shift to left compared to baseline. Patient states LLE feels heavier. No assistance needed for high level balance activities but mild deviations noted.  Stairs Stairs: Yes Stairs  assistance: Modified independent (Device/Increase time) Stair Management: One rail Right Number of Stairs: 8 General stair comments: Modified independent for stair negotiation with increased time.   Wheelchair Mobility    Modified Rankin (Stroke Patients Only) Modified Rankin (Stroke Patients Only) Pre-Morbid Rankin Score: No symptoms Modified Rankin: No significant disability     Balance Overall balance assessment: Independent        Vestibular Assessment - 09/26/17 0001      Vestibular Assessment   General Observation  Patient stating yesterday she had dizziness when looking to the left or turning her head to the left.       Symptom Behavior   Type of Dizziness  "Funny feeling in head"    Aggravating Factors  Rolling to left;Moving eyes    Relieving Factors  Lying supine      Occulomotor Exam   Occulomotor Alignment  Normal    Smooth Pursuits  Intact    Saccades  Intact      Positional Sensitivities   Sit to Supine  No dizziness    Supine to Left Side  No dizziness                               Standardized Balance Assessment Standardized Balance Assessment : Dynamic Gait Index   Dynamic Gait Index Level Surface: Mild Impairment Change in Gait Speed: Mild Impairment Gait with Horizontal Head Turns: Normal Gait with Vertical Head Turns: Normal Gait and Pivot Turn: Normal Step Over Obstacle: Normal Step Around Obstacles: Mild Impairment Steps: Mild Impairment Total Score: 20       Pertinent Vitals/Pain Pain Assessment: No/denies pain  Home Living Family/patient expects to be discharged to:: Private residence Living Arrangements: Children Available Help at Discharge: Family Type of Home: Apartment Home Access: Stairs to enter Entrance Stairs-Rails: Can reach both Entrance Stairs-Number of Steps: [place]: One level Home Equipment: None      Prior Function Level of Independence: Independent         Comments: Works in  Printmaker Dominance   Dominant Hand: Right    Extremity/Trunk Assessment   Upper Extremity Assessment Upper Extremity Assessment: Overall WFL for tasks assessed    Lower Extremity Assessment Lower Extremity Assessment: Defer to PT evaluation    Cervical / Trunk Assessment Cervical / Trunk Assessment: Normal  Communication   Communication: No difficulties  Cognition Arousal/Alertness: Awake/alert Behavior During Therapy: WFL for tasks assessed/performed Overall Cognitive Status: Within Functional Limits for tasks assessed                                        General Comments General comments (skin integrity, edema, etc.): Patient able to maintain bilateral SLS for 10 seconds.     Exercises     Assessment/Plan    PT Assessment All further PT needs can be met in the next venue of care  PT Problem List Decreased balance       PT Treatment Interventions      PT Goals (Current goals can be found in the Care Plan section)  Acute Rehab PT Goals Patient Stated Goal: to go home and rest  PT Goal Formulation: With patient Time For Goal Achievement: 09/28/17 Potential to Achieve Goals: Good    Frequency     Barriers to discharge        Co-evaluation               AM-PAC PT "6 Clicks" Daily Activity  Outcome Measure Difficulty turning over in bed (including adjusting bedclothes, sheets and blankets)?: None Difficulty moving from lying on back to sitting on the side of the bed? : None Difficulty sitting down on and standing up from a chair with arms (e.g., wheelchair, bedside commode, etc,.)?: None Help needed moving to and from a bed to chair (including a wheelchair)?: None Help needed walking in hospital room?: None Help needed climbing 3-5 steps with a railing? : None 6 Click Score: 24    End of Session Equipment Utilized During Treatment: Gait belt Activity Tolerance: Patient tolerated treatment well Patient left: in  bed;with call bell/phone within reach Nurse Communication: Mobility status PT Visit Diagnosis: Unsteadiness on feet (R26.81)    Time: 0100-7121 PT Time Calculation (min) (ACUTE ONLY): 35 min   Charges:   PT Evaluation $PT Eval Low Complexity: 1 Low PT Treatments $Therapeutic Activity: 8-22 mins   PT G Codes:        Ellamae Sia, PT, DPT Acute Rehabilitation Services  Pager: [place] 09/26/2017, 4:00 PM

## 2017-09-29 NOTE — Telephone Encounter (Signed)
Contacted pt and she was already aware of this. Kendra Hall, Kendra Hall D, [place]

## 2017-09-29 NOTE — Telephone Encounter (Signed)
We have discontinued Escitalopram because Cymbalta was started at recent office visit.   Phill Myron, D.O. 09/29/2017, 1:41 PM PGY-3, [place]

## 2017-10-22 ENCOUNTER — Other Ambulatory Visit: Payer: Self-pay | Admitting: Neurology

## 2017-10-29 ENCOUNTER — Ambulatory Visit (INDEPENDENT_AMBULATORY_CARE_PROVIDER_SITE_OTHER): Payer: 59 | Admitting: Neurology

## 2017-10-29 ENCOUNTER — Encounter: Payer: Self-pay | Admitting: Neurology

## 2017-10-29 VITALS — BP 160/80 | HR 77 | Wt 197.5 lb

## 2017-10-29 DIAGNOSIS — R55 Syncope and collapse: Secondary | ICD-10-CM | POA: Diagnosis not present

## 2017-10-29 DIAGNOSIS — I10 Essential (primary) hypertension: Secondary | ICD-10-CM | POA: Diagnosis not present

## 2017-10-29 DIAGNOSIS — G43009 Migraine without aura, not intractable, without status migrainosus: Secondary | ICD-10-CM | POA: Diagnosis not present

## 2017-10-29 NOTE — Patient Instructions (Signed)
1.  See if the migraines decrease on Cymbalta.  In 6 weeks, contact me:  If migraines well-controlled, then continue on Cymbalta  If migraines still at least 15 days a month, then consider Botox or one of the monthly self-injections  If migraines reduced in frequency to less than 15 days a month but still frequent, then consider the monthly self-injection 2.  Maxalt as needed/directed 3.  Follow up in 3 months.

## 2017-10-29 NOTE — Progress Notes (Signed)
NEUROLOGY FOLLOW UP OFFICE NOTE  KAJSA BUTRUM [id number]  HISTORY OF PRESENT ILLNESS: Kendra Hall is a 52 year old female with type 2 diabetes mellitus, fibromyalgia, depression and hypothyroidism who follows up for migraine.   UPDATE: She was recently started on Cymbalta.  Nortriptyline and Lexapro were discontinued. Intensity:  10/10 Duration:  2 hours with Maxalt Frequency:  20 days a month Frequency of abortive medication: Tylenol daily Current NSAIDS:  no Current analgesics:  Tylenol 500mg  at bedtime Current triptans:  Maxalt-MLT 10mg  Current anti-emetic:  promethazine 25mg  (makes her sleepy) Current muscle relaxants:  no Current anti-anxiolytic:  no Current sleep aide:  nortriptyline 75mg  as needed Current Antihypertensive medications:  No. Would not use beta blocker due to asthma. Current Antidepressant medications:  Cymbalta 30mg  twice daily Current Anticonvulsant medications:  topiramate ER 100mg  Current Vitamins/Herbal/Supplements:  no Current Antihistamines/Decongestants:  no Other therapy:  no   Caffeine:  no Alcohol:  no Smoker:  yes Diet:  hydrates Exercise:  Not routine Depression/anxiety:  stable Sleep hygiene:  good   HISTORY:  Onset:  She has history of migraines since her 83s, but they have been every other day for the past 2 weeks.  Last migraine before then was 6 months prior. Location:  Varies: back of head, across forehead, temples Quality:  Varies: squeezing, sharp Initial Intensity:  10/10 Aura:  no Prodrome:  no Postdrome:  no Associated symptoms:  Nausea, photophobia, phonophobia.  She has not had any new worse headache of her life, waking up from sleep Initial Duration:  All day (usually lets up in 30 minutes after taking Maxalt, however lately headache returns in 2 hours) Initial Frequency:  Every other day Initial Frequency of abortive medication: every other day Triggers/exacerbating factors:  unknown Relieving factors:   sleep Activity:  aggravates   Past NSAIDS:  ibuprofen Past analgesics:  Tylenol, Excedrin Migraine Past abortive triptans:  Sumatriptan tablet Past muscle relaxants:  no Past anti-emetic:  no Past antihypertensive medications:  no Past antidepressant medications:  sertraline 50mg , ortriptyline 75mg  as needed for sleep (cannot take nightly due to extreme dry mouth), Lexapro 20mg  Past anticonvulsant medications:  topiramate immediate release 100mg  (cognitive problems) Past vitamins/Herbal/Supplements:  no Other past therapies:  no   Family history of headache:  cousins   MRI and MRA of head from 03/15/15 were personally reviewed and were unremarkable except for minimal punctate white matter foci.  PAST MEDICAL HISTORY: Past Medical History:  Diagnosis Date  . Allergy   . Anemia    history of  . Anxiety   . Asthma   . Depression   . Diabetes mellitus   . Fibromyalgia   . Gastric ulcer   . GERD (gastroesophageal reflux disease)   . Grave's disease   . Graves disease   . H/O therapeutic radiation   . Migraine   . Multiple thyroid nodules   . Myofacial muscle pain   . Osteoarthritis    Facet hypertrophy with injections  . TIA (transient ischemic attack)    02/2015    MEDICATIONS: Current Outpatient Medications on File Prior to Visit  Medication Sig Dispense Refill  . albuterol (PROAIR HFA) 108 (90 Base) MCG/ACT inhaler Inhale 2 puffs into the lungs every 4 (four) hours as needed for wheezing. 1 Inhaler 2  . benzonatate (TESSALON) 100 MG capsule Take 1 capsule (100 mg total) by mouth 2 (two) times daily as needed for cough. (Patient taking differently: Take 100 mg by mouth at bedtime as  needed for cough. ) 20 capsule 0  . Blood Glucose Calibration (ONETOUCH VERIO) SOLN Used to check meter accuracy 1 each 0  . cetirizine (ZYRTEC) 10 MG tablet Take 10 mg by mouth at bedtime.    . Dulaglutide (TRULICITY) 1.5 WU/9.8JX SOPN Inject 1.5 mg weekly. (Patient taking differently: Inject  1.5 mg into the skin every Tuesday. ) 4 pen 11  . DULoxetine (CYMBALTA) 30 MG capsule Take 1 capsule (30 mg total) by mouth 2 (two) times daily. 180 capsule 3  . glucose blood (ONETOUCH VERIO) test strip 1 each by Other route 4 (four) times daily. And lancets 4/day 450 each 12  . levothyroxine (SYNTHROID, LEVOTHROID) 112 MCG tablet Take 1 tablet (112 mcg total) by mouth daily. 90 tablet 3  . Lifitegrast (XIIDRA) 5 % SOLN Place 1 drop into both eyes daily.    Marland Kitchen linaclotide (LINZESS) 145 MCG CAPS capsule Take 145 mcg by mouth daily before breakfast.    . metaxalone (SKELAXIN) 400 MG tablet Take 2 tablets (800 mg total) by mouth 3 (three) times daily. 60 tablet 1  . ondansetron (ZOFRAN) 4 MG tablet Take 1 tablet (4 mg total) by mouth every 8 (eight) hours as needed for nausea or vomiting. 20 tablet 2  . ranitidine (ZANTAC) 150 MG tablet Take 1 tablet (150 mg total) by mouth 2 (two) times daily. 60 tablet 1  . rizatriptan (MAXALT-MLT) 10 MG disintegrating tablet Take 1 tablet earliest onset of headache.  May repeat once in 2 hours if needed 9 tablet 11  . Topiramate ER (TROKENDI XR) 100 MG CP24 Take 100 mg by mouth daily. 30 capsule 6  . VICTOZA 18 MG/3ML SOPN ADMINISTER 1.2 MG UNDER THE SKIN DAILY (Patient taking differently: ADMINISTER 1.2 MG UNDER THE SKIN as needed depending on meals selection or size) 9 mL 0   No current facility-administered medications on file prior to visit.     ALLERGIES: Allergies  Allergen Reactions  . Ergotrate [Ergonovine] Other (See Comments)    Pt does not ever want  . Ibuprofen Other (See Comments)    Patient has ulcer  . Metformin And Related Other (See Comments)    Dizziness  . Yutopar [Ritodrine] Other (See Comments)    Pt does not ever want    FAMILY HISTORY: Family History  Problem Relation Age of Onset  . Hypertension Mother   . Diabetes Mother   . Cancer Mother        rectal  . Heart disease Father 50       MI x5  . Hypertension Father   .  Diabetes Father   . Cancer Sister 64       breast  . Drug abuse Brother   . Alcohol abuse Brother   . Drug abuse Brother   . Alcohol abuse Brother   . Anxiety disorder Neg Hx   . Bipolar disorder Neg Hx   . Depression Neg Hx     SOCIAL HISTORY: Social History   Socioeconomic History  . Marital status: Single    Spouse name: Not on file  . Number of children: 4  . Years of education: 86  . Highest education level: Not on file  Occupational History  . Occupation: Research scientist (physical sciences): [employer]  . Financial resource strain: Not on file  . Food insecurity:    Worry: Not on file    Inability: Not on file  . Transportation needs:    Medical:  Not on file    Non-medical: Not on file  Tobacco Use  . Smoking status: Former Smoker    Packs/day: 0.50    Years: 30.00    Pack years: 15.00    Types: Cigarettes  . Smokeless tobacco: Never Used  Substance and Sexual Activity  . Alcohol use: No    Comment: rare  . 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  Today denies all drug abuse  . Sexual activity: Not Currently    Birth control/protection: Surgical  Lifestyle  . Physical activity:    Days per week: Not on file    Minutes per session: Not on file  . Stress: Not on file  Relationships  . Social connections:    Talks on phone: Not on file    Gets together: Not on file    Attends religious service: Not on file    Active member of club or organization: Not on file    Attends meetings of clubs or organizations: Not on file    Relationship status: Not on file  . Intimate partner violence:    Fear of current or ex partner: Not on file    Emotionally abused: Not on file    Physically abused: Not on file    Forced sexual activity: Not on file  Other Topics Concern  . Not on file  Social History Narrative   Lives alone in [place]. Her daugther occassoinally comes  and stay with her. 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  originally from [place],  [place].  Raised by mom and her sister who is 8 yrs older than pt. Pt has several half siblings. No longer on disability. Pt has 4 kids, never married.           REVIEW OF SYSTEMS: Constitutional: No fevers, chills, or sweats, no generalized fatigue, change in appetite Eyes: No visual changes, double vision, eye pain Ear, nose and throat: No hearing loss, ear pain, nasal congestion, sore throat Cardiovascular: No chest pain, palpitations Respiratory:  No shortness of breath at rest or with exertion, wheezes GastrointestinaI: No nausea, vomiting, diarrhea, abdominal pain, fecal incontinence Genitourinary:  No dysuria, urinary retention or frequency Musculoskeletal:  No neck pain, back pain Integumentary: No rash, pruritus, skin lesions Neurological: as above Psychiatric: No depression, insomnia, anxiety Endocrine: No palpitations, fatigue, diaphoresis, mood swings, change in appetite, change in weight, increased thirst Hematologic/Lymphatic:  No purpura, petechiae. Allergic/Immunologic: no itchy/runny eyes, nasal congestion, recent allergic reactions, rashes  PHYSICAL EXAM: Vitals:   10/29/17 0743  BP: (!) 160/80  Pulse: 77  SpO2: 97%   General: No acute distress.  Patient appears well-groomed.   Head:  Normocephalic/atraumatic Eyes:  Fundi examined but not visualized Neck: supple, no paraspinal tenderness, full range of motion Heart:  Regular rate and rhythm Lungs:  Clear to auscultation bilaterally Back: No paraspinal tenderness Neurological Exam: alert and oriented to person, place, and time. Attention span and concentration intact, recent and remote memory intact, fund of knowledge intact.  Speech fluent and not dysarthric, language intact.  CN II-XII intact. Bulk and tone normal, muscle strength 5/5 throughout.  Sensation  to light touch  intact.  Deep tendon reflexes 2+ throughout.  Finger to nose testing intact.  Gait normal  IMPRESSION: Chronic migraine with and without aura, not intractable HTN  PLAN: 1.  As she was just started on Cymbalta 30mg  twice daily, she will monitor to see if it helps reduce frequency of her migraines.  She will contact me in 6 weeks:  If migraines well-controlled, then continue on Cymbalta  If migraines still at least 15 days a month, then consider Botox (over 15 headache days a month for years and failed several preventatives such as topiramate, nortriptyline and cannot take beta blocker due to asthma) or anti-CGRP  If migraines reduced in frequency to less than 15 days a month but still frequent, then consider anti-CGRP 2.  Maxalt as needed/directed 3.  Continue topiramate ER 100mg  daily 4.  Follow up blood pressure with PCP 5.  Follow up in 3 months.    Metta Clines, DO  CC:  Dr. Juleen China

## 2017-11-05 ENCOUNTER — Telehealth: Payer: Self-pay

## 2017-11-05 NOTE — Telephone Encounter (Signed)
Pt Kendra Hall with after hours phone service. She wanted the names of the anti-CGRP injectables.  Called and spoke with Pt, gave her Aimovig, Ajovy and Emgality. Pt to research if interested.

## 2017-11-16 ENCOUNTER — Other Ambulatory Visit: Payer: Self-pay | Admitting: Internal Medicine

## 2017-11-24 NOTE — Progress Notes (Signed)
PA initiated on covermymeds .

## 2017-12-12 NOTE — Progress Notes (Signed)
Rcvd denial from [employer]. Called [phone] and was advised since Pt is a [employer] employee, they have a dedicated team that handles their PA's and appeals, call [phone]. Spoke with Delcie Roch. I requested info where to send appeal. She gave f# [phone], and advised I mark it as urgent, standard appeals can take up to 30 days. Will fax appeal when Dr Tomi Likens returns from vacation on 12/18/17 and signs letter.

## 2017-12-15 ENCOUNTER — Other Ambulatory Visit: Payer: Self-pay | Admitting: Endocrinology

## 2017-12-15 NOTE — Progress Notes (Signed)
Rcvd denial thru cover my meds.  Pt is using manufacturer co-pay card. [employer] rep was here in the office, she called and spoke with pharmacy. Pt has a card, but Carin activated another, the ID is # [id number]

## 2017-12-16 ENCOUNTER — Other Ambulatory Visit: Payer: Self-pay | Admitting: Endocrinology

## 2017-12-20 ENCOUNTER — Other Ambulatory Visit: Payer: Self-pay | Admitting: Neurology

## 2017-12-29 ENCOUNTER — Telehealth: Payer: Self-pay | Admitting: Neurology

## 2017-12-29 NOTE — Telephone Encounter (Signed)
[employer] and left voicemail @ 9:33am to state the appeal for medication trokendi has been denied and a letter will be sent with all details. For further questions call the 800 customer service number on pts card. FYI

## 2018-01-07 ENCOUNTER — Other Ambulatory Visit: Payer: Self-pay

## 2018-01-07 ENCOUNTER — Encounter: Payer: Self-pay | Admitting: Family Medicine

## 2018-01-07 ENCOUNTER — Ambulatory Visit (INDEPENDENT_AMBULATORY_CARE_PROVIDER_SITE_OTHER): Payer: 59 | Admitting: Family Medicine

## 2018-01-07 DIAGNOSIS — M25512 Pain in left shoulder: Secondary | ICD-10-CM

## 2018-01-07 MED ORDER — METAXALONE 400 MG PO TABS: 800.0000 mg | ORAL_TABLET | Freq: Three times a day (TID) | ORAL | 1 refills | Status: DC

## 2018-01-07 MED ORDER — BENZONATATE 100 MG PO CAPS: 100.0000 mg | ORAL_CAPSULE | Freq: Two times a day (BID) | ORAL | 0 refills | Status: DC | PRN

## 2018-01-07 NOTE — Patient Instructions (Addendum)
Good to see you today!  Thanks for coming in.  You have a rotator cuff tendonitis   Definitely take tylenol three times a day for pain as needed - especially before bed  Take the muscle relaxer as needed especially at night  Do range of motion exercises and massage heat regularly three times a day   You should be better in 6-12 weeks it will get better and worse but overall better  If not improving may consider Physical Therapy or injection

## 2018-01-07 NOTE — Progress Notes (Signed)
Subjective  Kendra Hall is a 52 y.o. female is presenting with the following  L ARM PAIN For about 2 weeks.  No specific injury or overuse.  Achy feeling all around shoulder and Left neck.  No arm weakness or hand loss of sensation or shooting pains.  Has tried tylenol which helps some as well as massager and heat.   Chief Complaint noted Review of Symptoms - see HPI PMH - Smoking status noted.  Had bilateral axillary surgery years ago.  Has history of stomach ulcer does not take nsaids   Objective Vital Signs reviewed L shoulder - nearly FROM but hurts especially adduction. Positive empty can test.  Shoulder and lateral L neck diffusely tender to palpation without central spine tenderness No masses or tenderness in axilla Distal hand grip and sensation intact   Assessments/Plans  See after visit summary for details of patient instuctions  Left shoulder pain New.  Consistent with tendonitis.  No evidence of nerve impingement or fracture.  Treat as per after visit summary.  May need Physical Therapy or injection if persists

## 2018-01-07 NOTE — Assessment & Plan Note (Signed)
New.  Consistent with tendonitis.  No evidence of nerve impingement or fracture.  Treat as per after visit summary.  May need Physical Therapy or injection if persists

## 2018-01-08 ENCOUNTER — Encounter (HOSPITAL_COMMUNITY): Payer: Self-pay | Admitting: Emergency Medicine

## 2018-01-08 ENCOUNTER — Emergency Department (HOSPITAL_COMMUNITY)
Admission: EM | Admit: 2018-01-08 | Discharge: 2018-01-09 | Disposition: A | Discharge status: Home / Self Care | Payer: 59 | Attending: Emergency Medicine | Admitting: Emergency Medicine

## 2018-01-08 ENCOUNTER — Telehealth: Payer: Self-pay | Admitting: *Deleted

## 2018-01-08 ENCOUNTER — Other Ambulatory Visit: Payer: Self-pay

## 2018-01-08 ENCOUNTER — Emergency Department (HOSPITAL_COMMUNITY): Payer: 59

## 2018-01-08 DIAGNOSIS — Z79899 Other long term (current) drug therapy: Secondary | ICD-10-CM | POA: Diagnosis not present

## 2018-01-08 DIAGNOSIS — M25512 Pain in left shoulder: Secondary | ICD-10-CM

## 2018-01-08 DIAGNOSIS — Z8673 Personal history of transient ischemic attack (TIA), and cerebral infarction without residual deficits: Secondary | ICD-10-CM | POA: Insufficient documentation

## 2018-01-08 DIAGNOSIS — R079 Chest pain, unspecified: Secondary | ICD-10-CM | POA: Diagnosis present

## 2018-01-08 DIAGNOSIS — J45909 Unspecified asthma, uncomplicated: Secondary | ICD-10-CM | POA: Diagnosis not present

## 2018-01-08 DIAGNOSIS — Z87891 Personal history of nicotine dependence: Secondary | ICD-10-CM | POA: Diagnosis not present

## 2018-01-08 DIAGNOSIS — M4802 Spinal stenosis, cervical region: Secondary | ICD-10-CM | POA: Insufficient documentation

## 2018-01-08 DIAGNOSIS — M5412 Radiculopathy, cervical region: Secondary | ICD-10-CM | POA: Diagnosis not present

## 2018-01-08 DIAGNOSIS — E039 Hypothyroidism, unspecified: Secondary | ICD-10-CM | POA: Diagnosis not present

## 2018-01-08 DIAGNOSIS — E119 Type 2 diabetes mellitus without complications: Secondary | ICD-10-CM | POA: Insufficient documentation

## 2018-01-08 LAB — CBC
HCT: 45.7 % (ref 36.0–46.0)
Hemoglobin: 14.5 g/dL (ref 12.0–15.0)
MCH: 26.9 pg (ref 26.0–34.0)
MCHC: 31.7 g/dL (ref 30.0–36.0)
MCV: 84.8 fL (ref 78.0–100.0)
Platelets: 297 10*3/uL (ref 150–400)
RBC: 5.39 MIL/uL — ABNORMAL HIGH (ref 3.87–5.11)
RDW: 13.7 % (ref 11.5–15.5)
WBC: 8.2 10*3/uL (ref 4.0–10.5)

## 2018-01-08 LAB — BASIC METABOLIC PANEL
Anion gap: 7 (ref 5–15)
BUN: 15 mg/dL (ref 6–20)
CO2: 23 mmol/L (ref 22–32)
Calcium: 9.5 mg/dL (ref 8.9–10.3)
Chloride: 108 mmol/L (ref 98–111)
Creatinine, Ser: 0.92 mg/dL (ref 0.44–1.00)
GFR calc Af Amer: >60 mL/min (ref >60)
GFR calc non Af Amer: >60 mL/min (ref >60)
Glucose, Bld: 159 mg/dL — ABNORMAL HIGH (ref 70–99)
Potassium: 3.9 mmol/L (ref 3.5–5.1)
Sodium: 138 mmol/L (ref 135–145)

## 2018-01-08 LAB — I-STAT BETA HCG BLOOD, ED (MC, WL, AP ONLY): I-stat hCG, quantitative: <5 m[IU]/mL (ref <5)

## 2018-01-08 NOTE — ED Provider Notes (Signed)
Patient placed in Quick Look pathway, seen and evaluated  Chief Complaint: left shoulder pain  HPI:   Kendra Hall is a 52 y.o. female who presents to the ED with left shoulder pain that started a couple days ago. Patient reports that sometimes the pain goes across her chest. Patient denies n/v or shortness of breath. Pain increases with range of motion of the shoulder.   ROS: M/S: left shoulder pain  Pulmonary: chest pain, no SOB  Physical Exam:  Gen: No distress  Neuro: Awake and Alert  Skin: Warm and dry  M/S: left shoulder tender with palpation and range of motion. Increased pain to the posterior aspect of the shoulder.        Initiation of care has begun. The patient has been counseled on the process, plan, and necessity for staying for the completion/evaluation, and the remainder of the medical screening examination    Ashley Murrain, NP 01/08/18 1913    Noemi Chapel, MD 01/13/18 1131

## 2018-01-08 NOTE — Telephone Encounter (Signed)
PT lm on nurse line.  The tylenol is not helping her pain, she would like something stronger called in. [place], Kendra Hall, CMA

## 2018-01-08 NOTE — ED Notes (Signed)
Pt advising her lt hand is tingling.

## 2018-01-08 NOTE — Telephone Encounter (Signed)
Patient called again and is wanting someone to call her asap about getting stronger pain meds.

## 2018-01-08 NOTE — ED Triage Notes (Signed)
PT c/o chest pain that radiates to her back and left shoulder that started last week. Pain worse with palpation.

## 2018-01-09 ENCOUNTER — Telehealth: Payer: Self-pay

## 2018-01-09 ENCOUNTER — Emergency Department (HOSPITAL_COMMUNITY): Payer: 59

## 2018-01-09 ENCOUNTER — Telehealth: Payer: Self-pay | Admitting: Family Medicine

## 2018-01-09 LAB — I-STAT TROPONIN, ED: Troponin i, poc: 0 ng/mL (ref 0.00–0.08)

## 2018-01-09 MED ORDER — ACETAMINOPHEN 500 MG PO TABS: 1000.0000 mg | ORAL_TABLET | Freq: Once | ORAL | Status: DC

## 2018-01-09 MED ORDER — MORPHINE SULFATE (PF) 4 MG/ML IV SOLN
4.0000 mg | Freq: Once | INTRAVENOUS | Status: AC
Start: 2018-01-09 — End: 2018-01-09
  Administered 2018-01-09: 4 mg via INTRAVENOUS
  Filled 2018-01-09: qty 1

## 2018-01-09 MED ORDER — GABAPENTIN 300 MG PO CAPS: 300.0000 mg | ORAL_CAPSULE | Freq: Three times a day (TID) | ORAL | 0 refills | Status: DC

## 2018-01-09 MED ORDER — ONDANSETRON HCL 4 MG/2ML IJ SOLN
4.0000 mg | Freq: Once | INTRAMUSCULAR | Status: AC
  Administered 2018-01-09: 4 mg via INTRAVENOUS
  Filled 2018-01-09: qty 2

## 2018-01-09 MED ORDER — FAMOTIDINE 20 MG PO TABS
20.0000 mg | ORAL_TABLET | Freq: Once | ORAL | Status: AC
  Administered 2018-01-09: 20 mg via ORAL
  Filled 2018-01-09: qty 1

## 2018-01-09 MED ORDER — METHOCARBAMOL 500 MG PO TABS
500.0000 mg | ORAL_TABLET | Freq: Once | ORAL | Status: AC
  Administered 2018-01-09: 500 mg via ORAL
  Filled 2018-01-09: qty 1

## 2018-01-09 MED ORDER — LIDOCAINE 5 % EX PTCH: 1.0000 | MEDICATED_PATCH | CUTANEOUS | 0 refills | Status: DC

## 2018-01-09 NOTE — Telephone Encounter (Signed)
Patient with multiple calls about shoulder pain, clinic visit and ED visit in last 2 days.   Imaging showed spinal stenosis and she now has neurosurgery f/u.  Prescribing gabapening 300 TID  -Dr. Criss Rosales

## 2018-01-09 NOTE — ED Provider Notes (Signed)
[facility] EMERGENCY DEPARTMENT Provider Note   CSN: [id number] Arrival date & time: 01/08/18  9323     History   Chief Complaint Chief Complaint  Patient presents with  . Chest Pain    HPI Kendra Hall is a 52 y.o. female with a hx of anemia, allergies, asthma, NIDDM, fibromyalgia, GERD, graves disease, migraine, TIA presents to the Emergency Department complaining of gradual, persistent, progressively worsening left neck and shoulder pain onset 1.5 weeks ago.  Pt reports the pain radiates into her anterior and posterior shoulder including her left upper chest.  She reports some paresthesias of the left hand today.  No treatments PTA.  Pt reports pain is excruciating when she moves her left shoulder.  She denies trauma or falls.  She denies fevers, Hx of IVDU, swelling of the arm.  Pt reports no weakness in the arm.  CP is not associated with nausea, diaphoresis, weakness, syncope, dizziness.  Pt denies personal hx of of cardiac disease.    The history is provided by the patient and medical records. No language interpreter was used.    Past Medical History:  Diagnosis Date  . Allergy   . Anemia    history of  . Anxiety   . Asthma   . Depression   . Diabetes mellitus   . Fibromyalgia   . Gastric ulcer   . GERD (gastroesophageal reflux disease)   . Grave's disease   . Graves disease   . H/O therapeutic radiation   . Migraine   . Multiple thyroid nodules   . Myofacial muscle pain   . Osteoarthritis    Facet hypertrophy with injections  . TIA (transient ischemic attack)    02/2015    Patient Active Problem List   Diagnosis Date Noted  . Lt facial numbness 09/25/2017  . Back pain 06/15/2017  . Postoperative state 03/11/2017  . Trigger point 11/28/2016  . Health care maintenance 02/04/2016  . Severe episode of recurrent major depressive disorder, with psychotic features ([place]) 08/22/2015  . GAD (generalized anxiety disorder) 08/22/2015  . PTSD  (post-traumatic stress disorder) 08/22/2015  . Insomnia 08/22/2015  . GERD (gastroesophageal reflux disease) 04/18/2015  . Fibromyalgia 03/29/2015  . Type 2 diabetes mellitus without complication ([place])   . TIA (transient ischemic attack) 03/15/2015  . Mild intermittent asthma 02/28/2012  . Anxiety disorder 02/07/2012  . Hypertension 01/07/2012  . Hypothyroidism following radioiodine therapy 08/28/2011  . Grave's disease 05/07/2011  . Family hx-breast malignancy 11/20/2010  . Chronic migraine without aura 05/04/2010  . Left shoulder pain 09/08/2009  . TOBACCO USER 03/11/2009  . Notalgia 01/09/2009  . OBESITY, UNSPECIFIED 09/27/2008  . Mood disorder ([place]) 09/22/2008    Past Surgical History:  Procedure Laterality Date  . AXILLARY LYMPH NODE DISSECTION  2001  . HYDRADENITIS EXCISION    . TUBAL LIGATION  1995  . VAGINAL HYSTERECTOMY Left 03/11/2017   Procedure: HYSTERECTOMY VAGINAL W/LEFT PROXIMAL SALPINGECTOMY;  Surgeon: Princess Bruins, MD;  Location: [facility];  Service: Gynecology;  Laterality: Left;  request 7:30am OR time  request 2 hours       OB History    Gravida  5   Para      Term      Preterm      AB  2   Living  4     SAB      TAB      Ectopic  0   Multiple      Live  Births               Home Medications    Prior to Admission medications   Medication Sig Start Date End Date Taking? Authorizing Provider  albuterol (PROAIR HFA) 108 (90 Base) MCG/ACT inhaler Inhale 2 puffs into the lungs every 4 (four) hours as needed for wheezing. 07/31/17   Nicolette Bang, DO  benzonatate (TESSALON) 100 MG capsule Take 1 capsule (100 mg total) by mouth 2 (two) times daily as needed for cough. 01/07/18   Lind Covert, MD  Blood Glucose Calibration (ONETOUCH VERIO) SOLN Used to check meter accuracy 02/26/17   Renato Shin, MD  cetirizine (ZYRTEC) 10 MG tablet Take 10 mg by mouth at bedtime.    [provider]  Dulaglutide (TRULICITY)  1.5 RX/5.4MG SOPN Inject 1.5 mg weekly. Patient taking differently: Inject 1.5 mg into the skin every Tuesday.  01/15/17   Renato Shin, MD  DULoxetine (CYMBALTA) 30 MG capsule Take 1 capsule (30 mg total) by mouth 2 (two) times daily. 09/22/17   Nicolette Bang, DO  glucose blood (ONETOUCH VERIO) test strip 1 each by Other route 4 (four) times daily. And lancets 4/day 12/27/16   Renato Shin, MD  levothyroxine (SYNTHROID, LEVOTHROID) 112 MCG tablet Take 1 tablet (112 mcg total) by mouth daily. 09/09/17   Nicolette Bang, DO  Lifitegrast Shirley Friar) 5 % SOLN Place 1 drop into both eyes daily.    [provider]  linaclotide (LINZESS) 145 MCG CAPS capsule Take 145 mcg by mouth daily before breakfast.    [provider]  metaxalone (SKELAXIN) 400 MG tablet Take 2 tablets (800 mg total) by mouth 3 (three) times daily. 01/07/18   Lind Covert, MD  ondansetron (ZOFRAN) 4 MG tablet Take 1 tablet (4 mg total) by mouth every 8 (eight) hours as needed for nausea or vomiting. 01/16/17   Pieter Partridge, DO  ONETOUCH VERIO test strip TEST TWICE DAILY 12/17/17   Renato Shin, MD  ranitidine (ZANTAC) 150 MG tablet Take 1 tablet (150 mg total) by mouth 2 (two) times daily. 09/03/17   Nicolette Bang, DO  rizatriptan (MAXALT-MLT) 10 MG disintegrating tablet Take 1 tablet earliest onset of headache.  May repeat once in 2 hours if needed 10/15/16   Metta Clines R, DO  TROKENDI XR 100 MG CP24 TAKE 1 CAPSULE BY MOUTH DAILY 12/22/17   Tomi Likens, Adam R, DO  VICTOZA 18 MG/3ML SOPN ADMINISTER 1.2 MG UNDER THE SKIN DAILY Patient taking differently: ADMINISTER 1.2 MG UNDER THE SKIN as needed depending on meals selection or size 09/19/17   Elayne Snare, MD    Family History Family History  Problem Relation Age of Onset  . Hypertension Mother   . Diabetes Mother   . Cancer Mother        rectal  . Heart disease Father 19       MI x5  . Hypertension Father   . Diabetes Father   .  Cancer Sister 59       breast  . Drug abuse Brother   . Alcohol abuse Brother   . Drug abuse Brother   . Alcohol abuse Brother   . Anxiety disorder Neg Hx   . Bipolar disorder Neg Hx   . Depression Neg Hx     Social History Social History   Tobacco Use  . Smoking status: Former Smoker    Packs/day: 0.50    Years: 30.00    Pack years:  15.00    Types: Cigarettes  . Smokeless tobacco: Never Used  Substance Use Topics  . Alcohol use: No    Comment: rare  . 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  Today denies all drug abuse     Allergies   Ergotrate [ergonovine]; Ibuprofen; Metformin and related; and Yutopar [ritodrine]   Review of Systems Review of Systems  Constitutional: Negative for appetite change, diaphoresis, fatigue, fever and unexpected weight change.  HENT: Negative for mouth sores.   Eyes: Negative for visual disturbance.  Respiratory: Negative for cough, chest tightness, shortness of breath and wheezing.   Cardiovascular: Positive for chest pain.  Gastrointestinal: Negative for abdominal pain, constipation, diarrhea, nausea and vomiting.  Endocrine: Negative for polydipsia, polyphagia and polyuria.  Genitourinary: Negative for dysuria, frequency, hematuria and urgency.  Musculoskeletal: Positive for arthralgias and neck pain. Negative for back pain and neck stiffness.  Skin: Negative for rash.  Allergic/Immunologic: Negative for immunocompromised state.  Neurological: Negative for syncope, light-headedness and headaches.  Hematological: Does not bruise/bleed easily.  Psychiatric/Behavioral: Negative for sleep disturbance. The patient is not nervous/anxious.      Physical Exam Updated Vital Signs BP 132/64   Pulse 79   Temp 98.4 F (36.9 C) (Oral)   Resp (!) 21   LMP 12/06/2016 (Approximate)   SpO2 100%   Physical Exam  Constitutional: She appears well-developed and  well-nourished. No distress.  Awake, alert, nontoxic appearance  HENT:  Head: Normocephalic and atraumatic.  Mouth/Throat: Oropharynx is clear and moist. No oropharyngeal exudate.  Eyes: Conjunctivae are normal. No scleral icterus.  Neck: Neck supple. Spinous process tenderness and muscular tenderness present. Decreased range of motion present.    Cardiovascular: Normal rate, regular rhythm and intact distal pulses.  Pulmonary/Chest: Effort normal and breath sounds normal. No respiratory distress. She has no wheezes.  Equal chest expansion  Abdominal: Soft. Bowel sounds are normal. She exhibits no mass. There is no tenderness. There is no rebound and no guarding.  Musculoskeletal: She exhibits no edema.       Right shoulder: Normal.       Left shoulder: She exhibits decreased range of motion, tenderness, pain and decreased strength. She exhibits no effusion, no crepitus, no deformity, no laceration and normal pulse.       Left elbow: Normal.       Left wrist: Normal.       Left hand: Normal. She exhibits normal range of motion, no tenderness and normal capillary refill. Normal sensation noted. Normal strength noted.  Neurological: She is alert.  Speech is clear and goal oriented Moves extremities without ataxia  Skin: Skin is warm and dry. She is not diaphoretic.  Psychiatric: She has a normal mood and affect.  Nursing note and vitals reviewed.    ED Treatments / Results  Labs (all labs ordered are listed, but only abnormal results are displayed) Labs Reviewed  BASIC METABOLIC PANEL - Abnormal; Notable for the following components:      Result Value   Glucose, Bld 159 (*)    All other components within normal limits  CBC - Abnormal; Notable for the following components:   RBC 5.39 (*)    All other components within normal limits  I-STAT TROPONIN, ED  I-STAT BETA HCG BLOOD, ED ([facility], [facility], [facility] ONLY)  I-STAT TROPONIN, ED    EKG EKG Interpretation  Date/Time:  Thursday  January 08 2018 18:59:09 EDT Ventricular Rate:  79 PR Interval:  140 QRS Duration: 70 QT Interval:  394 QTC Calculation: 451 R Axis:   41 Text Interpretation:  Normal sinus rhythm Cannot rule out Anterior infarct , age undetermined Abnormal ECG No significant change was found Confirmed by Ezequiel Essex [phone]) on 01/09/2018 5:58:06 AM      Radiology Dg Chest 2 View  Result Date: 01/08/2018 CLINICAL DATA:  Chest pain radiating to the back and left shoulder EXAM: CHEST - 2 VIEW COMPARISON:  September 23, 2016 FINDINGS: The heart size and mediastinal contours are within normal limits. Both lungs are clear. The visualized skeletal structures are unremarkable. IMPRESSION: No active cardiopulmonary disease. Electronically Signed   By: Abelardo Diesel M.D.   On: 01/08/2018 20:48   Ct Cervical Spine Wo Contrast  Result Date: 01/09/2018 CLINICAL DATA:  52 y/o F; neck pain radiating into her shoulder and left arm with tingling and nausea. EXAM: CT CERVICAL SPINE WITHOUT CONTRAST TECHNIQUE: Multidetector CT imaging of the cervical spine was performed without intravenous contrast. Multiplanar CT image reconstructions were also generated. COMPARISON:  None. FINDINGS: Alignment: Straightening of cervical lordosis without listhesis. Skull base and vertebrae: No acute fracture. No primary bone lesion or focal pathologic process. Soft tissues and spinal canal: No prevertebral fluid or swelling. No visible canal hematoma. Disc levels: Mild C4-5 and moderate C5-C7 intervertebral disc space narrowing. Multilevel small endplate marginal osteophytes. Uncovertebral hypertrophy bilaterally at the C4-C7 levels results in bony neural foraminal stenosis. Multifactorial mild canal stenosis at the C5-6 level. Upper chest: Negative. Other: None. IMPRESSION: 1. Cervical spondylosis with predominantly discogenic degenerative changes greatest at the C4-C7 levels. 2. C4-C7 bilateral neural foraminal stenosis from uncovertebral hypertrophy. 3.  Multifactorial C5-6 mild canal stenosis. No high-grade bony canal stenosis. Electronically Signed   By: Kristine Garbe M.D.   On: 01/09/2018 05:00   Dg Shoulder Left  Result Date: 01/09/2018 CLINICAL DATA:  52 y/o  F; left shoulder pain. EXAM: LEFT SHOULDER - 2+ VIEW COMPARISON:  None. FINDINGS: There is no evidence of fracture or dislocation. There is no evidence of arthropathy or other focal bone abnormality. Soft tissues are unremarkable. IMPRESSION: Negative. Electronically Signed   By: Kristine Garbe M.D.   On: 01/09/2018 04:33    Procedures Procedures (including critical care time)  Medications Ordered in ED Medications  famotidine (PEPCID) tablet 20 mg (has no administration in time range)  ondansetron (ZOFRAN) injection 4 mg (4 mg Intravenous Given 01/09/18 0434)  morphine 4 MG/ML injection 4 mg (4 mg Intravenous Given 01/09/18 0434)  methocarbamol (ROBAXIN) tablet 500 mg (500 mg Oral Given 01/09/18 0434)     Initial Impression / Assessment and Plan / ED Course  I have reviewed the triage vital signs and the nursing notes.  Pertinent labs & imaging results that were available during my care of the patient were reviewed by me and considered in my medical decision making (see chart for details).     Presents with left-sided neck and shoulder pain that radiates down into her left upper chest.  It is aggravated with arm movement.  Symptoms concerning for radiculopathy.  Less likely to be cardiac etiology.  EKG is without acute ischemia and is unchanged from previous.  Troponin negative.  Patient without infectious symptoms.  No elevation white blood cell count, fever or tachycardia.  Patient given muscle relaxers and pain control here in the emergency department with improvement.  Plain films of her chest and shoulder are  without acute abnormalities.  CT scan of her neck shows now stenosis but no acute abnormalities.  I do believe this is the source of patient's pain.   She has a history of fibromyalgia.  Recommend use of acetaminophen and follow-up with neurosurgery.  Recommend continued use of Skelaxin at home.  Final Clinical Impressions(s) / ED Diagnoses   Final diagnoses:  Spinal stenosis of cervical region  Cervical radicular pain    ED Discharge Orders    None       Loni Muse Gwenlyn Perking 01/09/18 [id number]    Ezequiel Essex, MD 01/09/18 2250

## 2018-01-09 NOTE — Telephone Encounter (Signed)
Called Patient: She is very frustrated that I will not write her an opioid prescription.  She feels she is getting"the run around" from doctors because she "knows what works for her pain" and doesn't understand why we won't write her for opioids.  I explained that I don't believe narcotics for chronic pain is an appropriate treatment and that I would not be doing so.  This upset her because she said the PA in the ED told her the ED couldn't write those meds but I could.  I explained that we could offer a higher dose of gabapentin than I wrote for a muscle relaxer like baclofen and she declined these options.  She was very unhappy and said I was making her feel like a drug addict begging for meds.  I explained that was not at all my intention and that as the phone conversation was becoming quite tense it might be more productive if we ended the call and she discussed the topic in person in clinic.  She will be seeing someone in pool clinic on Monday.  -Dr. Criss Rosales

## 2018-01-09 NOTE — Telephone Encounter (Signed)
Phone call from patient that Lidocaine patch requires prior authorization. PA submitted via CoverMyMeds. Status pending. May take up to 72 hours for a response.   Danley Danker, RN [facility] RN)

## 2018-01-09 NOTE — Telephone Encounter (Signed)
Patient came in person to office to speak to someone about needing pain medication.  Patient expressed frustration due to recent office visit for her shoulder pain where she was told to continue the same treatment plan she has been doing.   She had called office after that visit to request some pain medication but had not received a call back before close of day. Proceeded to ED due to pain.  At ED was found to have cervical stenosis. They treated her pain successfully with IV Morphine and recommended follow up with neurosurgery. ED did not prescribe any pain medication stating it needs to come from PCP.   Patient called office again and reported neurosurgery appt date of 02/06/18 again requesting pain medication. Patient was alerted by her pharmacy that a prescription for Gabapentin has been called in. Patient has had this med in the past multiple times without improvement. Patient frustrated because her PCP had changed and she stated she was not aware and that Dr Juleen China knew Gabapentin does not help.  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  Spoke with preceptor, Dr. Gwendlyn Deutscher, who will prescribe Mobic and patient needs to see PCP as soon as she can. Patient new PCP, Dr. Criss Rosales, has no availability until August 2. Patient placed on access to care for Monday to speak with a provider regarding management of her pain until neurosurgery appt. Note routed to PCP for any further advice/orders.  Patient call back is [phone] and it is okay to leave a message.   Danley Danker, RN [facility] RN)

## 2018-01-09 NOTE — Addendum Note (Signed)
Addended by: Sherene Sires R on: 01/09/2018 11:59 AM   Modules accepted: Orders

## 2018-01-09 NOTE — Telephone Encounter (Signed)
Pt calls back.  She just left the ED at 7:30 this am.  She was Dx with spinal stenosis and they have referred her to [place] [facility].   She said the the muscle relaxers and 1300mg  of tylenol are not helping.  She would told by the ED that her primary could give her some pain meds.  She is requesting pain meds to get her thru till her appt @ neurosurgery (she is calling now to set up an appt and will call back and let us know the date).  States that she is in "so much pain it bring tears to my eyes and I have to work"  Advised that she call neurosurgery and get an appt, in the meantime I would send the message to the MD.  Fleeger, [name], [place]

## 2018-01-09 NOTE — Telephone Encounter (Signed)
Patients appt with neurosurgery is 02/06/18 @ 9:30. Fleeger, [name], CMA

## 2018-01-09 NOTE — Discharge Instructions (Addendum)
1. Medications: Continue taking Skelaxin.  Use Tylenol for help with pain, usual home medications 2. Treatment: rest, ice, elevate and use brace, drink plenty of fluids, gentle stretching 3. Follow Up: Please followup with orthopedics or neurosurgery for discussion of your diagnoses and further evaluation after today's visit; if you do not have a primary care doctor use the resource guide provided to find one; Please return to the ER for worsening symptoms or other concerns

## 2018-01-09 NOTE — Telephone Encounter (Signed)
Danley Danker precepted patient with me. She had been seen multiple times for neck and shoulder pain with recent visit to the ED and was diagnosed with cervical stenosis. Per Delrae Rend, she has a neurosurg appointment already schedule. Tylenol and muscle relaxant is not helping.  Per record, she has GI irritation with Ibuprofen. Unclear how long she was on it or the dose. Gabapentin is not helpful. Hx of Ulcer.  Initial thought was to give Morbic with PPI. She had done Ibuprofen in the past with PPI. Given hx of Ulcer, I will give give Lidocaine patch. I called and discussed plan with her. She agreed with the plan. Continue muscle relaxant and tylenol as needed.

## 2018-01-09 NOTE — Telephone Encounter (Signed)
Thanks. Have patient f/u with PCP soon for pain management.

## 2018-01-12 ENCOUNTER — Ambulatory Visit: Payer: 59 | Admitting: Family Medicine

## 2018-01-13 NOTE — Telephone Encounter (Signed)
Scheduled with Burr Medico for next week, as pt did not want to see pcp.

## 2018-01-15 NOTE — Telephone Encounter (Signed)
Prior approval for lidocaine patch completed via covermymeds.  Med approved for 01/15/18 - 07/12/18  Prior approval # [id number].  [place] pharmacy informed.  Joana Nolton, [name], CMA

## 2018-01-20 ENCOUNTER — Encounter: Payer: Self-pay | Admitting: Student in an Organized Health Care Education/Training Program

## 2018-01-20 ENCOUNTER — Ambulatory Visit (INDEPENDENT_AMBULATORY_CARE_PROVIDER_SITE_OTHER): Payer: 59 | Admitting: Student in an Organized Health Care Education/Training Program

## 2018-01-20 ENCOUNTER — Other Ambulatory Visit: Payer: Self-pay

## 2018-01-20 ENCOUNTER — Other Ambulatory Visit: Payer: Self-pay | Admitting: Student in an Organized Health Care Education/Training Program

## 2018-01-20 ENCOUNTER — Telehealth: Payer: Self-pay | Admitting: *Deleted

## 2018-01-20 VITALS — BP 140/78 | HR 85 | Ht 64.0 in | Wt 199.0 lb

## 2018-01-20 DIAGNOSIS — M79602 Pain in left arm: Secondary | ICD-10-CM

## 2018-01-20 MED ORDER — PREGABALIN 50 MG PO CAPS: 50.0000 mg | ORAL_CAPSULE | Freq: Two times a day (BID) | ORAL | 0 refills | Status: DC | PRN

## 2018-01-20 MED ORDER — BACLOFEN 10 MG PO TABS: 10.0000 mg | ORAL_TABLET | Freq: Three times a day (TID) | ORAL | 0 refills | Status: DC | PRN

## 2018-01-20 MED ORDER — ACETAMINOPHEN 500 MG PO TABS
1000.0000 mg | ORAL_TABLET | Freq: Three times a day (TID) | ORAL | 0 refills | Status: DC
Start: 2018-01-20 — End: 2018-03-10

## 2018-01-20 NOTE — Telephone Encounter (Signed)
Received fax from pharmacy, PA needed on pregabalin.  Clinical questions submitted via Cover My Meds.  Waiting on response, could take up to 72 hours.  Cover My Meds info: Key: [id number]  Fleeger, [name], [place]     Approved until 01/21/2019.  Pharmacy informed. Fleeger, [name], CMA

## 2018-01-20 NOTE — Patient Instructions (Signed)
It was a pleasure seeing you today in our clinic. Here is the treatment plan we have discussed and agreed upon together:  STOP taking gabapentin, skelaxin.  START taking tylenol, lyrica and baclofen. Take these medications gradually at first as they may cause sedation. Do not drive after taking. Do not take the lyrica and baclofen together.  Our clinic's number is [phone]. Please call with questions or concerns about what we discussed today.  Be well, Dr. Burr Medico

## 2018-01-20 NOTE — Progress Notes (Signed)
CC: shoulder pain  HPI: Kendra Hall is a 52 y.o. female with PMH significant for hypertension, chronic migraine, TIA, GERD, hypothyroidism, type 2 diabetes who presents to [facility] today with left shoulder pain of 3 weeks duration.   Patient was in her usual state of health until 3 weeks ago when she developed gradual onset of left shoulder pain.  No trauma, no injury.  3 weeks duration.   No history of similar pain in the past.  She endorses aching pain on the top of her shoulder, tugging/pulling discomfort in her arm.  Intensity comes in waves sometimes.  She reports pain is 10 out of 10 in severity at its worst.  She has attempted to control the pain with gabapentin, Skelaxin, heat, massage, lidocaine patches, and Tylenol 650 mg as needed.  She denies any history of intra-articular surgery on the left shoulder, however she has reportedly had and thought content in that area.  She was seen in the emergency department on 7/19 where CT cervical spine without contrast was consistent with  Cervical spondylosi discogenic degenerative changes at C4-C7.  Additionally she had C4-C7 bilateral neural foraminal stenosis from uncovertebral hypertrophy.  She was also noted to have C5-C6 mild canal stenosis.  Her pain was thought to be secondary to these findings and she was referred to neurology.  Her appointment with neurology is on 8/15.  She would like something to help control the pain until she goes to her neurology appointment.  Review of Symptoms:  See HPI for ROS.   CC, SH/smoking status, and VS noted.  Objective: BP 140/78   Pulse 85   Ht 5\' 4"  (1.626 m)   Wt 199 lb (90.3 kg)   LMP 12/06/2016 (Approximate)   SpO2 98%   Breastfeeding? No   BMI 34.16 kg/m  GEN: NAD, alert, cooperative, and pleasant. Shoulder, left: Exam limited due to pain.  Tenderness to palpation noted over the left medial scapular border with tenseness of the trapezius appreciated.  No evidence of bony deformity or  asymmetry.  No muscle atrophy.  No tenderness over the long head of the biceps/bicipital groove.  No tenderness at the [facility] joint.  Active and passive range of motion limited due to pain.  Patient refuses strength testing.  Sensation intact.  Peripheral pulses intact.   7/19 CT Cervical Spine w/Contrast IMPRESSION: 1. Cervical spondylosis with predominantly discogenic degenerative changes greatest at the C4-C7 levels. 2. C4-C7 bilateral neural foraminal stenosis from uncovertebral hypertrophy. 3. Multifactorial C5-6 mild canal stenosis. No high-grade bony canal stenosis.  Assessment and plan:  Left shoulder pain May be neurogenic pain due to cervical spine foraminal stenosis, however it appears she additionally has an overlying muscle spasm which is causing her pain. She is open to trying a different pain regimen to help with her discomfort. She expresses concern that when she called to tell her PCP about her pain he may have thought she was a "drug addict." - explained to the patient that we do not typically prescribe narcotics for her type of pain - we can switch up her pain regimen by discontinuing gabapentin and trying lyrica. We will also discontinue her current muscle relaxer and try baclofen. - advised patient not to take muscle relaxer and lyrica together, as both of these medications may cause sedation. She was advised not to drive after taking these medications. - we will additionally try standing tylenol TID - continue massage and heat - patient to follow up with neurosurgery this month -  return precautions discussed   Everrett Coombe, MD,MS,  PGY3 01/20/2018 9:12 AM

## 2018-01-22 ENCOUNTER — Other Ambulatory Visit: Payer: Self-pay | Admitting: Endocrinology

## 2018-01-25 ENCOUNTER — Other Ambulatory Visit: Payer: Self-pay | Admitting: Endocrinology

## 2018-01-25 NOTE — Telephone Encounter (Signed)
Please refill x 1 Ov is due  

## 2018-01-27 ENCOUNTER — Ambulatory Visit: Payer: 59 | Admitting: Family Medicine

## 2018-02-05 ENCOUNTER — Telehealth: Payer: Self-pay | Admitting: Neurology

## 2018-02-05 NOTE — Telephone Encounter (Signed)
Patient called and left a voicemail wanting to talk to someone about a shot that she could get. She didn't leave the name of the shot. Please call her back at [phone]. Thanks.

## 2018-02-06 ENCOUNTER — Other Ambulatory Visit: Payer: Self-pay | Admitting: Family Medicine

## 2018-02-06 NOTE — Telephone Encounter (Signed)
Called and spoke with Pt. She would like to try Emgality. She is aware it may require a PA. Pt made appt for 02/25/18

## 2018-02-21 ENCOUNTER — Other Ambulatory Visit: Payer: Self-pay | Admitting: Endocrinology

## 2018-02-21 NOTE — Telephone Encounter (Signed)
Please refill x 1 Ov is due  

## 2018-02-24 NOTE — Progress Notes (Deleted)
NEUROLOGY FOLLOW UP OFFICE NOTE  Kendra Hall [id number]  HISTORY OF PRESENT ILLNESS: Kendra Hall is a 52 year old female with type 2 diabetes mellitus, fibromyalgia, depression and hypothyroidism who follows up for migraine.  UPDATE: Intensity:  *** Duration:  *** Frequency:  *** Frequency of abortive medication: *** Current NSAIDS:  no Current analgesics:  Tylenol 500mg  at bedtime Current triptans:  Maxalt-MLT 10mg  Current ergotamine:  *** Current anti-emetic:  Promethazine 25mg  (makes her sleepy) Current muscle relaxants:  no Current anti-anxiolytic:  no Current sleep aide:  no Current Antihypertensive medications:  Would not use beta blocker due to asthma Current Antidepressant medications:  Cymbalta 30mg , Nortriptyline 75mg  as needed Current Anticonvulsant medications:  Topiramate ER 100mg  Current anti-CGRP:  no Current Vitamins/Herbal/Supplements:  no Current Antihistamines/Decongestants:  no Other therapy:  no Hormone/birth control:  ***  She states that she would like to try Emgality.  ***  Caffeine:  no Alcohol:  no Smoker:  yes Diet:  hydrates Exercise:  Not routine Depression:  Yes but stable; Anxiety:  Yes but stable Other pain:  *** Sleep hygiene:  good  HISTORY: Onset:  She has history of migraines since her 15s, but they have been every other day for the past 2 weeks.  Last migraine before then was 6 months prior. Location:  Varies: back of head, across forehead, temples Quality:  Varies: squeezing, sharp Initial Intensity:  10/10 Aura:  no Prodrome:  no Postdrome:  no Associated symptoms:  Nausea, photophobia, phonophobia.  She denies associated vomiting, visual disturbance, or unilateral numbness or weakness.  It is not a new thunderclap headache. Initial Duration:  All day (usually lets up in 30 minutes after taking Maxalt, however lately headache returns in 2 hours) Initial Frequency:  Every other day Initial Frequency of abortive  medication: every other day Triggers/aggravating factors:  none Relieving factors:  sleep Activity:  aggravates  Past NSAIDS:  ibuprofen Past analgesics:  Tylenol, Excedrin Migraine Past abortive triptans:  Sumatriptan tablet Past muscle relaxants:  no Past anti-emetic:  no Past antihypertensive medications:  no Past antidepressant medications:  sertraline 50mg , ortriptyline 75mg  as needed for sleep (cannot take nightly due to extreme dry mouth), Lexapro 20mg  Past anticonvulsant medications:  topiramate immediate release 100mg  (cognitive problems) Past vitamins/Herbal/Supplements:  no Other past therapies:  no  Family history of headache:  cousins  MRI and MRA of head from 03/15/15 were personally reviewed and were unremarkable except for minimal punctate white matter foci.  PAST MEDICAL HISTORY: Past Medical History:  Diagnosis Date  . Allergy   . Anemia    history of  . Anxiety   . Asthma   . Depression   . Diabetes mellitus   . Fibromyalgia   . Gastric ulcer   . GERD (gastroesophageal reflux disease)   . Grave's disease   . Graves disease   . H/O therapeutic radiation   . Migraine   . Multiple thyroid nodules   . Myofacial muscle pain   . Osteoarthritis    Facet hypertrophy with injections  . TIA (transient ischemic attack)    02/2015    MEDICATIONS: Current Outpatient Medications on File Prior to Visit  Medication Sig Dispense Refill  . acetaminophen (TYLENOL) 500 MG tablet Take 2 tablets (1,000 mg total) by mouth 3 (three) times daily. 90 tablet 0  . albuterol (PROAIR HFA) 108 (90 Base) MCG/ACT inhaler Inhale 2 puffs into the lungs every 4 (four) hours as needed for wheezing. 1 Inhaler 2  .  baclofen (LIORESAL) 10 MG tablet TAKE 1 TABLET(10 MG) BY MOUTH THREE TIMES DAILY AS NEEDED FOR MUSCLE SPASMS 270 tablet 0  . benzonatate (TESSALON) 100 MG capsule Take 1 capsule (100 mg total) by mouth 2 (two) times daily as needed for cough. 20 capsule 0  . Blood Glucose  Calibration (ONETOUCH VERIO) SOLN Used to check meter accuracy 1 each 0  . cetirizine (ZYRTEC) 10 MG tablet Take 10 mg by mouth at bedtime.    . DULoxetine (CYMBALTA) 30 MG capsule Take 1 capsule (30 mg total) by mouth 2 (two) times daily. 180 capsule 3  . levothyroxine (SYNTHROID, LEVOTHROID) 112 MCG tablet Take 1 tablet (112 mcg total) by mouth daily. 90 tablet 3  . lidocaine (LIDODERM) 5 % UNWRAP AND APPLY 1 PATCH TO SKIN DAILY FOR 15 DAYS. REMOVE AND DISCARD PATCH WITHIN 12 HOURS OR AS DIRECTED 15 patch 0  . Lifitegrast (XIIDRA) 5 % SOLN Place 1 drop into both eyes daily.    Marland Kitchen linaclotide (LINZESS) 145 MCG CAPS capsule Take 145 mcg by mouth daily before breakfast.    . ondansetron (ZOFRAN) 4 MG tablet Take 1 tablet (4 mg total) by mouth every 8 (eight) hours as needed for nausea or vomiting. 20 tablet 2  . ONETOUCH DELICA LANCETS 33A MISC USE FOUR TIMES DAILY AS DIRECTED 500 each 0  . ONETOUCH VERIO test strip TEST TWICE DAILY 150 each 0  . pregabalin (LYRICA) 50 MG capsule Take 1 capsule (50 mg total) by mouth 2 (two) times daily as needed (nerve pain). 90 capsule 0  . ranitidine (ZANTAC) 150 MG tablet Take 1 tablet (150 mg total) by mouth 2 (two) times daily. 60 tablet 1  . rizatriptan (MAXALT-MLT) 10 MG disintegrating tablet Take 1 tablet earliest onset of headache.  May repeat once in 2 hours if needed 9 tablet 11  . TROKENDI XR 100 MG CP24 TAKE 1 CAPSULE BY MOUTH DAILY 30 capsule 2  . TRULICITY 1.5 SN/0.5LZ SOPN INJECT 1.5 MG INTO THE SKIN ONCE WEEKLY 2 mL 0  . VICTOZA 18 MG/3ML SOPN ADMINISTER 1.2 MG UNDER THE SKIN DAILY (Patient taking differently: ADMINISTER 1.2 MG UNDER THE SKIN as needed depending on meals selection or size) 9 mL 0   No current facility-administered medications on file prior to visit.     ALLERGIES: Allergies  Allergen Reactions  . Ergotrate [Ergonovine] Other (See Comments)    Pt does not ever want  . Ibuprofen Other (See Comments)    Patient has ulcer  .  Metformin And Related Other (See Comments)    Dizziness  . Yutopar [Ritodrine] Other (See Comments)    Pt does not ever want    FAMILY HISTORY: Family History  Problem Relation Age of Onset  . Hypertension Mother   . Diabetes Mother   . Cancer Mother        rectal  . Heart disease Father 62       MI x5  . Hypertension Father   . Diabetes Father   . Cancer Sister 81       breast  . Drug abuse Brother   . Alcohol abuse Brother   . Drug abuse Brother   . Alcohol abuse Brother   . Anxiety disorder Neg Hx   . Bipolar disorder Neg Hx   . Depression Neg Hx    SOCIAL HISTORY: Social History   Socioeconomic History  . Marital status: Single    Spouse name: Not on file  . Number  of children: 4  . Years of education: 42  . Highest education level: Not on file  Occupational History  . Occupation: Research scientist (physical sciences): [employer]  . Financial resource strain: Not on file  . Food insecurity:    Worry: Not on file    Inability: Not on file  . Transportation needs:    Medical: Not on file    Non-medical: Not on file  Tobacco Use  . Smoking status: Former Smoker    Packs/day: 0.50    Years: 30.00    Pack years: 15.00    Types: Cigarettes  . Smokeless tobacco: Never Used  Substance and Sexual Activity  . Alcohol use: No    Comment: rare  . 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  Today denies all drug abuse  . Sexual activity: Not Currently    Birth control/protection: Surgical  Lifestyle  . Physical activity:    Days per week: Not on file    Minutes per session: Not on file  . Stress: Not on file  Relationships  . Social connections:    Talks on phone: Not on file    Gets together: Not on file    Attends religious service: Not on file    Active member of club or organization: Not on file    Attends meetings of clubs or organizations: Not on file     Relationship status: Not on file  . Intimate partner violence:    Fear of current or ex partner: Not on file    Emotionally abused: Not on file    Physically abused: Not on file    Forced sexual activity: Not on file  Other Topics Concern  . Not on file  Social History Narrative   Lives alone in [place]. Her daugther occassoinally comes and stay with her. 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  originally from [place],  [place].  Raised by mom and her sister who is 8 yrs older than pt. Pt has several half siblings. No longer on disability. Pt has 4 kids, never married.           REVIEW OF SYSTEMS: Constitutional: No fevers, chills, or sweats, no generalized fatigue, change in appetite Eyes: No visual changes, double vision, eye pain Ear, nose and throat: No hearing loss, ear pain, nasal congestion, sore throat Cardiovascular: No chest pain, palpitations Respiratory:  No shortness of breath at rest or with exertion, wheezes GastrointestinaI: No nausea, vomiting, diarrhea, abdominal pain, fecal incontinence Genitourinary:  No dysuria, urinary retention or frequency Musculoskeletal:  No neck pain, back pain Integumentary: No rash, pruritus, skin lesions Neurological: as above Psychiatric: No depression, insomnia, anxiety Endocrine: No palpitations, fatigue, diaphoresis, mood swings, change in appetite, change in weight, increased thirst Hematologic/Lymphatic:  No purpura, petechiae. Allergic/Immunologic: no itchy/runny eyes, nasal congestion, recent allergic reactions, rashes  PHYSICAL EXAM: *** General: No acute distress.  Patient appears ***-groomed.  *** body habitus. Head:  Normocephalic/atraumatic Eyes:  Fundi examined but not visualized Neck: supple, no paraspinal tenderness, full range of motion Heart:  Regular rate and rhythm Lungs:  Clear to auscultation bilaterally Back: No paraspinal  tenderness Neurological Exam: alert and oriented to person, place, and time. Attention span and concentration intact, recent and remote memory intact, fund of knowledge  intact.  Speech fluent and not dysarthric, language intact.  CN II-XII intact. Bulk and tone normal, muscle strength 5/5 throughout.  Sensation to light touch  intact.  Deep tendon reflexes 2+ throughout.  Finger to nose testing intact.  Gait normal, Romberg negative.  IMPRESSION: *** migraine with and without aura, not intractable, without status migrainosus Tobacco use disorder HTN  PLAN: 1.  *** 2.  *** 3.  Limit use of pain relievers to no more than 2 days out of the week to prevent rebound headache 4.  Keep headache diary 5.  Follow up in ***  Metta Clines, DO  CC: Sherene Sires, DO

## 2018-02-25 ENCOUNTER — Ambulatory Visit: Payer: 59 | Admitting: Neurology

## 2018-02-26 ENCOUNTER — Ambulatory Visit: Payer: 59

## 2018-03-03 ENCOUNTER — Other Ambulatory Visit: Payer: Self-pay | Admitting: Nurse Practitioner

## 2018-03-03 DIAGNOSIS — Z1231 Encounter for screening mammogram for malignant neoplasm of breast: Secondary | ICD-10-CM

## 2018-03-06 ENCOUNTER — Other Ambulatory Visit: Payer: Self-pay

## 2018-03-06 ENCOUNTER — Emergency Department (HOSPITAL_COMMUNITY)
Admission: EM | Admit: 2018-03-06 | Discharge: 2018-03-06 | Disposition: A | Discharge status: Home / Self Care | Payer: 59 | Attending: Emergency Medicine | Admitting: Emergency Medicine

## 2018-03-06 ENCOUNTER — Emergency Department (HOSPITAL_COMMUNITY): Payer: 59

## 2018-03-06 ENCOUNTER — Encounter (HOSPITAL_COMMUNITY): Payer: Self-pay | Admitting: Emergency Medicine

## 2018-03-06 ENCOUNTER — Telehealth: Payer: Self-pay | Admitting: Endocrinology

## 2018-03-06 ENCOUNTER — Telehealth: Payer: Self-pay | Admitting: Internal Medicine

## 2018-03-06 DIAGNOSIS — J4521 Mild intermittent asthma with (acute) exacerbation: Secondary | ICD-10-CM | POA: Diagnosis not present

## 2018-03-06 DIAGNOSIS — I10 Essential (primary) hypertension: Secondary | ICD-10-CM | POA: Insufficient documentation

## 2018-03-06 DIAGNOSIS — Z720 Tobacco use: Secondary | ICD-10-CM

## 2018-03-06 DIAGNOSIS — Z79899 Other long term (current) drug therapy: Secondary | ICD-10-CM | POA: Insufficient documentation

## 2018-03-06 DIAGNOSIS — E0965 Drug or chemical induced diabetes mellitus with hyperglycemia: Secondary | ICD-10-CM | POA: Diagnosis not present

## 2018-03-06 DIAGNOSIS — F17210 Nicotine dependence, cigarettes, uncomplicated: Secondary | ICD-10-CM | POA: Diagnosis not present

## 2018-03-06 DIAGNOSIS — T380X5A Adverse effect of glucocorticoids and synthetic analogues, initial encounter: Secondary | ICD-10-CM

## 2018-03-06 DIAGNOSIS — E032 Hypothyroidism due to medicaments and other exogenous substances: Secondary | ICD-10-CM | POA: Diagnosis not present

## 2018-03-06 DIAGNOSIS — Z7984 Long term (current) use of oral hypoglycemic drugs: Secondary | ICD-10-CM | POA: Insufficient documentation

## 2018-03-06 DIAGNOSIS — R0602 Shortness of breath: Secondary | ICD-10-CM | POA: Diagnosis present

## 2018-03-06 DIAGNOSIS — J40 Bronchitis, not specified as acute or chronic: Secondary | ICD-10-CM

## 2018-03-06 DIAGNOSIS — R739 Hyperglycemia, unspecified: Secondary | ICD-10-CM

## 2018-03-06 LAB — BASIC METABOLIC PANEL
Anion gap: 13 (ref 5–15)
BUN: 24 mg/dL — ABNORMAL HIGH (ref 6–20)
CO2: 29 mmol/L (ref 22–32)
Calcium: 9.9 mg/dL (ref 8.9–10.3)
Chloride: 94 mmol/L — ABNORMAL LOW (ref 98–111)
Creatinine, Ser: 1.31 mg/dL — ABNORMAL HIGH (ref 0.44–1.00)
GFR calc Af Amer: 53 mL/min — ABNORMAL LOW (ref >60)
GFR calc non Af Amer: 46 mL/min — ABNORMAL LOW (ref >60)
Glucose, Bld: 615 mg/dL (ref 70–99)
Potassium: 5.1 mmol/L (ref 3.5–5.1)
Sodium: 136 mmol/L (ref 135–145)

## 2018-03-06 LAB — CBG MONITORING, ED
Glucose-Capillary: 585 mg/dL (ref 70–99)
Glucose-Capillary: >600 mg/dL (ref 70–99)
Glucose-Capillary: 460 mg/dL — ABNORMAL HIGH (ref 70–99)
Glucose-Capillary: 353 mg/dL — ABNORMAL HIGH (ref 70–99)
Glucose-Capillary: 222 mg/dL — ABNORMAL HIGH (ref 70–99)

## 2018-03-06 LAB — I-STAT TROPONIN, ED: Troponin i, poc: 0.02 ng/mL (ref 0.00–0.08)

## 2018-03-06 LAB — BRAIN NATRIURETIC PEPTIDE: B Natriuretic Peptide: 17.6 pg/mL (ref 0.0–100.0)

## 2018-03-06 MED ORDER — ALBUTEROL (5 MG/ML) CONTINUOUS INHALATION SOLN
10.0000 mg/h | INHALATION_SOLUTION | RESPIRATORY_TRACT | Status: DC
  Administered 2018-03-06: 10 mg/h via RESPIRATORY_TRACT
  Filled 2018-03-06 (×2): qty 20

## 2018-03-06 MED ORDER — INSULIN ASPART 100 UNIT/ML ~~LOC~~ SOLN
10.0000 [IU] | Freq: Once | SUBCUTANEOUS | Status: AC
  Administered 2018-03-06: 10 [IU] via INTRAVENOUS
  Filled 2018-03-06: qty 1

## 2018-03-06 MED ORDER — LORAZEPAM 2 MG/ML IJ SOLN
0.5000 mg | Freq: Once | INTRAMUSCULAR | Status: AC
  Administered 2018-03-06: 0.5 mg via INTRAVENOUS
  Filled 2018-03-06: qty 1

## 2018-03-06 MED ORDER — LORAZEPAM 0.5 MG PO TABS: 0.5000 mg | ORAL_TABLET | Freq: Three times a day (TID) | ORAL | 0 refills | Status: DC | PRN

## 2018-03-06 MED ORDER — METHYLPREDNISOLONE SODIUM SUCC 125 MG IJ SOLR
125.0000 mg | Freq: Once | INTRAMUSCULAR | Status: AC
  Administered 2018-03-06: 125 mg via INTRAVENOUS
  Filled 2018-03-06: qty 2

## 2018-03-06 MED ORDER — ALBUTEROL SULFATE (2.5 MG/3ML) 0.083% IN NEBU
5.0000 mg | INHALATION_SOLUTION | Freq: Once | RESPIRATORY_TRACT | Status: AC
  Administered 2018-03-06: 5 mg via RESPIRATORY_TRACT
  Filled 2018-03-06: qty 6

## 2018-03-06 MED ORDER — INSULIN ASPART 100 UNIT/ML ~~LOC~~ SOLN
10.0000 [IU] | Freq: Once | SUBCUTANEOUS | Status: AC
  Administered 2018-03-06: 10 [IU] via INTRAVENOUS
  Filled 2018-03-06 (×2): qty 1

## 2018-03-06 NOTE — Telephone Encounter (Signed)
Per [facility] 03/05/18 11:40 pm-Caller stated she has pneumonia and her glucose is high at 551. She took prednisone and insulin, it went down but then it went back up

## 2018-03-06 NOTE — Telephone Encounter (Signed)
Pt currently seeing Dr Loanne Drilling for her sugars.  Pt recently diagnosed with pneumonia.  Has been seen at [facility] for a few visits and on varying abx.  She is currently on levaquin and prednisone.  Still with increased cough and congestion.  Has not been checking her sugars, but did check today.  Blood sugar 500.  She had a small amount of humalog and gave herself 4-6 units.  Blood sugar initially after insulin 480.  States is right back up to 550 now.  Does not feel well.  Persistent cough, etc.  Given persistent symptoms despite multiple abx and given blood sugar level, pt was advised to go to ER for evaluation.  She agreed.

## 2018-03-06 NOTE — ED Provider Notes (Signed)
[facility] DEPT Provider Note   CSN: [id number] Arrival date & time: 03/06/18  1117     History   Chief Complaint Chief Complaint  Patient presents with  . Shortness of Breath  . Hyperglycemia    HPI Kendra Hall is a 52 y.o. female.  Pt presents to the ED today with sob.  The pt has sick for almost 2 weeks.  She has been to urgent care several times.  She has been on zithromax, augmentin, and levaquin.  The pt has also been on steroids.  She is diabetic and her blood sugars have been high.  The pt does smoke, but has not been able to smoke since she's been sick.  The pt said she's not been able to sleep due to the steroids and feels like her heart is racing.  The pt denies f/c.     Past Medical History:  Diagnosis Date  . Allergy   . Anemia    history of  . Anxiety   . Asthma   . Depression   . Diabetes mellitus   . Fibromyalgia   . Gastric ulcer   . GERD (gastroesophageal reflux disease)   . Grave's disease   . Graves disease   . H/O therapeutic radiation   . Migraine   . Multiple thyroid nodules   . Myofacial muscle pain   . Osteoarthritis    Facet hypertrophy with injections  . TIA (transient ischemic attack)    02/2015    Patient Active Problem List   Diagnosis Date Noted  . Lt facial numbness 09/25/2017  . Back pain 06/15/2017  . Postoperative state 03/11/2017  . Trigger point 11/28/2016  . Health care maintenance 02/04/2016  . Severe episode of recurrent major depressive disorder, with psychotic features ([place]) 08/22/2015  . GAD (generalized anxiety disorder) 08/22/2015  . PTSD (post-traumatic stress disorder) 08/22/2015  . Insomnia 08/22/2015  . GERD (gastroesophageal reflux disease) 04/18/2015  . Fibromyalgia 03/29/2015  . Type 2 diabetes mellitus without complication ([place])   . TIA (transient ischemic attack) 03/15/2015  . Mild intermittent asthma 02/28/2012  . Anxiety disorder 02/07/2012  . Hypertension  01/07/2012  . Hypothyroidism following radioiodine therapy 08/28/2011  . Grave's disease 05/07/2011  . Family hx-breast malignancy 11/20/2010  . Chronic migraine without aura 05/04/2010  . Left shoulder pain 09/08/2009  . TOBACCO USER 03/11/2009  . Notalgia 01/09/2009  . OBESITY, UNSPECIFIED 09/27/2008  . Mood disorder ([place]) 09/22/2008    Past Surgical History:  Procedure Laterality Date  . AXILLARY LYMPH NODE DISSECTION  2001  . HYDRADENITIS EXCISION    . TUBAL LIGATION  1995  . VAGINAL HYSTERECTOMY Left 03/11/2017   Procedure: HYSTERECTOMY VAGINAL W/LEFT PROXIMAL SALPINGECTOMY;  Surgeon: Princess Bruins, MD;  Location: [facility];  Service: Gynecology;  Laterality: Left;  request 7:30am OR time  request 2 hours       OB History    Gravida  5   Para      Term      Preterm      AB  2   Living  4     SAB      TAB      Ectopic  0   Multiple      Live Births               Home Medications    Prior to Admission medications   Medication Sig Start Date End Date Taking? Authorizing Provider  albuterol [facility] HFA)  108 (90 Base) MCG/ACT inhaler Inhale 2 puffs into the lungs every 4 (four) hours as needed for wheezing. 07/31/17  Yes Nicolette Bang, DO  albuterol (PROVENTIL) (2.5 MG/3ML) 0.083% nebulizer solution Take 3 mLs by nebulization 4 (four) times daily as needed for wheezing or shortness of breath.  03/02/18  Yes [provider]  baclofen (LIORESAL) 10 MG tablet TAKE 1 TABLET(10 MG) BY MOUTH THREE TIMES DAILY AS NEEDED FOR MUSCLE SPASMS Patient taking differently: Take 10 mg by mouth 3 (three) times daily as needed for muscle spasms.  01/20/18  Yes Bland, Scott, DO  benzonatate (TESSALON) 100 MG capsule Take 1 capsule (100 mg total) by mouth 2 (two) times daily as needed for cough. 01/07/18  Yes Lind Covert, MD  cetirizine (ZYRTEC) 10 MG tablet Take 10 mg by mouth at bedtime as needed for allergies.    Yes [provider]    DULoxetine (CYMBALTA) 30 MG capsule Take 1 capsule (30 mg total) by mouth 2 (two) times daily. 09/22/17  Yes Nicolette Bang, DO  HYDROMET 5-1.5 MG/5ML syrup Take 5 mLs by mouth 4 (four) times daily as needed for cough.  03/01/18  Yes [provider]  levofloxacin (LEVAQUIN) 750 MG tablet Take 750 mg by mouth daily. 02/28/18  Yes [provider]  levothyroxine (SYNTHROID, LEVOTHROID) 112 MCG tablet Take 1 tablet (112 mcg total) by mouth daily. Patient taking differently: Take 112 mcg by mouth daily before breakfast.  09/09/17  Yes Nicolette Bang, DO  lidocaine (LIDODERM) 5 % UNWRAP AND APPLY 1 PATCH TO SKIN DAILY FOR 15 DAYS. REMOVE AND DISCARD PATCH WITHIN 12 HOURS OR AS DIRECTED Patient taking differently: Place 1 patch onto the skin daily as needed (pain).  02/09/18  Yes Bland, Scott, DO  Lifitegrast (XIIDRA) 5 % SOLN Place 1 drop into both eyes daily as needed (dry eyes).    Yes [provider]  linaclotide (LINZESS) 145 MCG CAPS capsule Take 145 mcg by mouth daily before breakfast.   Yes [provider]  Menthol (HALLS COUGH DROPS MT) Use as directed 1 drop in the mouth or throat as needed (cough).   Yes [provider]  ondansetron (ZOFRAN) 4 MG tablet Take 1 tablet (4 mg total) by mouth every 8 (eight) hours as needed for nausea or vomiting. 01/16/17  Yes Tomi Likens, Adam R, DO  predniSONE (DELTASONE) 20 MG tablet Take 20 mg by mouth 2 (two) times daily. 02/28/18  Yes [provider]  pregabalin (LYRICA) 50 MG capsule Take 1 capsule (50 mg total) by mouth 2 (two) times daily as needed (nerve pain). 01/20/18  Yes Everrett Coombe, MD  ranitidine (ZANTAC) 150 MG tablet Take 1 tablet (150 mg total) by mouth 2 (two) times daily. Patient taking differently: Take 150 mg by mouth 2 (two) times daily as needed for heartburn.  09/03/17  Yes Nicolette Bang, DO  rizatriptan (MAXALT-MLT) 10 MG disintegrating tablet Take 1 tablet earliest  onset of headache.  May repeat once in 2 hours if needed Patient taking differently: Take 10 mg by mouth See admin instructions. Take 1 tablet earliest onset of headache.  May repeat once in 2 hours if needed 10/15/16  Yes Jaffe, Adam R, DO  TROKENDI XR 100 MG CP24 TAKE 1 CAPSULE BY MOUTH DAILY Patient taking differently: Take 100 mg by mouth daily.  12/22/17  Yes Jaffe, Adam R, DO  TRULICITY 1.5 YN/8.2NF SOPN INJECT 1.5 MG INTO THE SKIN ONCE WEEKLY Patient taking  differently: Inject 1.5 mg into the skin once a week. On Tuesday 02/24/18  Yes Renato Shin, MD  VICTOZA 18 MG/3ML SOPN ADMINISTER 1.2 MG UNDER THE SKIN DAILY Patient taking differently: Inject 1.2 mg into the skin daily.  09/19/17  Yes Elayne Snare, MD  acetaminophen (TYLENOL) 500 MG tablet Take 2 tablets (1,000 mg total) by mouth 3 (three) times daily. Patient not taking: Reported on 03/06/2018 01/20/18   Everrett Coombe, MD  Blood Glucose Calibration (ONETOUCH VERIO) SOLN Used to check meter accuracy 02/26/17   Renato Shin, MD  LORazepam (ATIVAN) 0.5 MG tablet Take 1 tablet (0.5 mg total) by mouth every 8 (eight) hours as needed for anxiety. 03/06/18   Isla Pence, MD  [facility] DELICA LANCETS 09X MISC USE FOUR TIMES DAILY AS DIRECTED 01/22/18   Renato Shin, MD  [facility] VERIO test strip TEST TWICE DAILY 12/17/17   Renato Shin, MD    Family History Family History  Problem Relation Age of Onset  . Hypertension Mother   . Diabetes Mother   . Cancer Mother        rectal  . Heart disease Father 43       MI x5  . Hypertension Father   . Diabetes Father   . Cancer Sister 57       breast  . Drug abuse Brother   . Alcohol abuse Brother   . Drug abuse Brother   . Alcohol abuse Brother   . Anxiety disorder Neg Hx   . Bipolar disorder Neg Hx   . Depression Neg Hx     Social History Social History   Tobacco Use  . Smoking status: Current Some Day Smoker    Packs/day: 0.50    Years: 30.00    Pack years: 15.00    Types:  Cigarettes  . Smokeless tobacco: Never Used  Substance Use Topics  . Alcohol use: No    Comment: rare  . 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  Today denies all drug abuse     Allergies   Ergotrate [ergonovine]; Ibuprofen; Metformin and related; and Yutopar [ritodrine]   Review of Systems Review of Systems  Respiratory: Positive for cough, shortness of breath and wheezing.   Cardiovascular: Positive for palpitations.  All other systems reviewed and are negative.    Physical Exam Updated Vital Signs BP (!) 149/87 (BP Location: Left Arm)   Pulse 88   Temp 98.3 F (36.8 C) (Oral)   Resp 18   Wt 91.6 kg   LMP 12/06/2016 (Approximate)   SpO2 95%   BMI 34.67 kg/m   Physical Exam  Constitutional: She is oriented to person, place, and time. She appears well-developed and well-nourished.  HENT:  Head: Normocephalic and atraumatic.  Mouth/Throat: Oropharynx is clear and moist.  Eyes: Pupils are equal, round, and reactive to light. EOM are normal.  Neck: Normal range of motion. Neck supple.  Cardiovascular: Regular rhythm, normal heart sounds and intact distal pulses. Tachycardia present.  Pulmonary/Chest: She has wheezes.  Abdominal: Soft. Bowel sounds are normal.  Musculoskeletal: Normal range of motion.       Right lower leg: Normal.       Left lower leg: Normal.  Neurological: She is alert and oriented to person, place, and time.  Skin: Skin is warm. Capillary refill takes less than 2 seconds.  Psychiatric: She has a normal mood  and affect. Her behavior is normal.  Nursing note and vitals reviewed.    ED Treatments / Results  Labs (all labs ordered are listed, but only abnormal results are displayed) Labs Reviewed  BASIC METABOLIC PANEL - Abnormal; Notable for the following components:      Result Value   Chloride 94 (*)    Glucose, Bld 615 (*)    BUN 24 (*)    Creatinine, Ser 1.31  (*)    GFR calc non Af Amer 46 (*)    GFR calc Af Amer 53 (*)    All other components within normal limits  CBG MONITORING, ED - Abnormal; Notable for the following components:   Glucose-Capillary 585 (*)    All other components within normal limits  CBG MONITORING, ED - Abnormal; Notable for the following components:   Glucose-Capillary >600 (*)    All other components within normal limits  CBG MONITORING, ED - Abnormal; Notable for the following components:   Glucose-Capillary 460 (*)    All other components within normal limits  CBG MONITORING, ED - Abnormal; Notable for the following components:   Glucose-Capillary 353 (*)    All other components within normal limits  BRAIN NATRIURETIC PEPTIDE  I-STAT TROPONIN, ED  CBG MONITORING, ED  CBG MONITORING, ED    EKG None  Radiology Dg Chest 2 View  Result Date: 03/06/2018 CLINICAL DATA:  Recent bronchitis diagnosis.  Shortness of breath. EXAM: CHEST - 2 VIEW COMPARISON:  01/08/2018 FINDINGS: Both lungs are clear. No pleural effusions. Heart and mediastinum are within normal limits. Trachea is midline. Bone structures are unremarkable. IMPRESSION: No active cardiopulmonary disease. Electronically Signed   By: Markus Daft M.D.   On: 03/06/2018 12:51    Procedures Procedures (including critical care time)  Medications Ordered in ED Medications  albuterol (PROVENTIL,VENTOLIN) solution continuous neb (10 mg/hr Nebulization New Bag/Given 03/06/18 1314)  LORazepam (ATIVAN) injection 0.5 mg (has no administration in time range)  albuterol (PROVENTIL) (2.5 MG/3ML) 0.083% nebulizer solution 5 mg (5 mg Nebulization Given 03/06/18 1156)  methylPREDNISolone sodium succinate (SOLU-MEDROL) 125 mg/2 mL injection 125 mg (125 mg Intravenous Given 03/06/18 1306)  insulin aspart (novoLOG) injection 10 Units (10 Units Intravenous Given 03/06/18 1307)  insulin aspart (novoLOG) injection 10 Units (10 Units Intravenous Given 03/06/18 1418)  insulin aspart  (novoLOG) injection 10 Units (10 Units Intravenous Given 03/06/18 1521)     Initial Impression / Assessment and Plan / ED Course  I have reviewed the triage vital signs and the nursing notes.  Pertinent labs & imaging results that were available during my care of the patient were reviewed by me and considered in my medical decision making (see chart for details).    Pt is doing much better.  Breathing is excellent after continuous neb.  Oxygenation is 98-99% on RA.   The pt's blood sugar is down after 30 units total of novolog insulin.  Pt is feeling better and is stable for d/c.  She is told to stop the steroids and, otherwise, continue current treatment.  She is encouraged to continue to not smoke.  Return if worse.  Final Clinical Impressions(s) / ED Diagnoses   Final diagnoses:  Steroid-induced hyperglycemia  Bronchitis  Mild intermittent asthma with exacerbation  Tobacco abuse    ED Discharge Orders         Ordered    LORazepam (ATIVAN) 0.5 MG tablet  Every 8 hours PRN     03/06/18 1541  Isla Pence, MD 03/06/18 [phone]

## 2018-03-06 NOTE — ED Notes (Addendum)
Critical glucose of 615 taken , and relayed to Maddie, RN and Dr Gilford Raid.

## 2018-03-06 NOTE — Discharge Instructions (Addendum)
Stop steroids.  Otherwise continue current meds.  Continue your attempts to stop smoking.

## 2018-03-06 NOTE — ED Triage Notes (Addendum)
Pt reports that she felt her "heart racing" since last night. Pt c/o shortness of breath. Dry cough noted. Pt reports a blood sugar of over 400 yesterday that she tx with Humalog. Pt is concerned that her blood sugar is  elevated due to steroids that she is taking for pneumonia. Pt is alert, oriented and ambulatory. Pt drove self to ED Stated that CBG was 494 at 10:15 am

## 2018-03-08 ENCOUNTER — Other Ambulatory Visit: Payer: Self-pay | Admitting: Neurology

## 2018-03-09 ENCOUNTER — Telehealth: Payer: Self-pay | Admitting: Neurology

## 2018-03-09 NOTE — Telephone Encounter (Signed)
Please advise 

## 2018-03-09 NOTE — Telephone Encounter (Signed)
Ov this week, please

## 2018-03-09 NOTE — Telephone Encounter (Signed)
[facility] is needing to speak with you regarding the patient's Trokendi XR Medication. Please Call. Thanks

## 2018-03-09 NOTE — Telephone Encounter (Signed)
Pt stated that it was 179 and 240 today when checked and pt has not eaten

## 2018-03-09 NOTE — Telephone Encounter (Signed)
Ok, how is cbg now?

## 2018-03-09 NOTE — Progress Notes (Signed)
NEUROLOGY FOLLOW UP OFFICE NOTE  Kendra Hall [id number]  HISTORY OF PRESENT ILLNESS: SHAUNI HENNER is a 52 year old female with type 2 diabetes mellitus, fibromyalgia, depression and hypothyroidism who follows up for migraine.  UPDATE: She has had a cold for 3 weeks but headaches improved. Intensity:  severe Duration:  2 hours with Maxalt Frequency:  4 days a month Current NSAIDS:  no Current analgesics:  Tylenol 500mg  at bedtime Current triptans:  Maxalt-MLT 10mg  Current ergotamine:  no Current anti-emetic:  Promethazine 25mg  (makes her sleepy) Current muscle relaxants:  no Current anti-anxiolytic:  no Current sleep aide:  no Current Antihypertensive medications:  Would not use beta blocker due to asthma Current Antidepressant medications:  Cymbalta 30mg , Nortriptyline 75mg  as needed Current Anticonvulsant medications:  Topiramate ER 100mg  Current anti-CGRP:  no Current Vitamins/Herbal/Supplements:  no Current Antihistamines/Decongestants:  no Other therapy:  no Hormone/birth control:  no  She previously stated that she would like to try Emgality.   Caffeine:  no Alcohol:  no Smoker:  yes Diet:  hydrates Exercise:  Not routine Depression:  Yes but stable; Anxiety:  Yes but stable Other pain:  Neck pain Sleep hygiene:  good  HISTORY: Onset:  She has history of migraines since her 23s, but they have been every other day for the past 2 weeks.  Last migraine before then was 6 months prior. Location:  Varies: back of head, across forehead, temples Quality:  Varies: squeezing, sharp Initial Intensity:  10/10 Aura:  no Prodrome:  no Postdrome:  no Associated symptoms:  Nausea, photophobia, phonophobia.  She denies associated vomiting, visual disturbance, or unilateral numbness or weakness.  It is not a new thunderclap headache. Initial Duration:  All day (usually lets up in 30 minutes after taking Maxalt, however lately headache returns in 2 hours) Initial  Frequency:  Every other day Initial Frequency of abortive medication: every other day Triggers/aggravating factors:  none Relieving factors:  sleep Activity:  aggravates  Past NSAIDS:  ibuprofen Past analgesics:  Tylenol, Excedrin Migraine Past abortive triptans:  Sumatriptan tablet Past muscle relaxants:  no Past anti-emetic:  no Past antihypertensive medications:  no Past antidepressant medications:  sertraline 50mg , nortriptyline 75mg  as needed for sleep (cannot take nightly due to extreme dry mouth), Lexapro 20mg  Past anticonvulsant medications:  topiramate immediate release 100mg  (cognitive problems) Past vitamins/Herbal/Supplements:  no Other past therapies:  no  Family history of headache:  cousins  MRI and MRA of head from 03/15/15 were personally reviewed and were unremarkable except for minimal punctate white matter foci.  PAST MEDICAL HISTORY: Past Medical History:  Diagnosis Date  . Allergy   . Anemia    history of  . Anxiety   . Asthma   . Depression   . Diabetes mellitus   . Fibromyalgia   . Gastric ulcer   . GERD (gastroesophageal reflux disease)   . Grave's disease   . Graves disease   . H/O therapeutic radiation   . Migraine   . Multiple thyroid nodules   . Myofacial muscle pain   . Osteoarthritis    Facet hypertrophy with injections  . TIA (transient ischemic attack)    02/2015    MEDICATIONS: Current Outpatient Medications on File Prior to Visit  Medication Sig Dispense Refill  . acetaminophen (TYLENOL) 500 MG tablet Take 2 tablets (1,000 mg total) by mouth 3 (three) times daily. (Patient not taking: Reported on 03/06/2018) 90 tablet 0  . albuterol (PROAIR HFA) 108 (90 Base) MCG/ACT  inhaler Inhale 2 puffs into the lungs every 4 (four) hours as needed for wheezing. 1 Inhaler 2  . albuterol (PROVENTIL) (2.5 MG/3ML) 0.083% nebulizer solution Take 3 mLs by nebulization 4 (four) times daily as needed for wheezing or shortness of breath.   3  .  baclofen (LIORESAL) 10 MG tablet TAKE 1 TABLET(10 MG) BY MOUTH THREE TIMES DAILY AS NEEDED FOR MUSCLE SPASMS (Patient taking differently: Take 10 mg by mouth 3 (three) times daily as needed for muscle spasms. ) 270 tablet 0  . benzonatate (TESSALON) 100 MG capsule Take 1 capsule (100 mg total) by mouth 2 (two) times daily as needed for cough. 20 capsule 0  . Blood Glucose Calibration (ONETOUCH VERIO) SOLN Used to check meter accuracy 1 each 0  . cetirizine (ZYRTEC) 10 MG tablet Take 10 mg by mouth at bedtime as needed for allergies.     . DULoxetine (CYMBALTA) 30 MG capsule Take 1 capsule (30 mg total) by mouth 2 (two) times daily. 180 capsule 3  . HYDROMET 5-1.5 MG/5ML syrup Take 5 mLs by mouth 4 (four) times daily as needed for cough.   0  . levofloxacin (LEVAQUIN) 750 MG tablet Take 750 mg by mouth daily.  0  . levothyroxine (SYNTHROID, LEVOTHROID) 112 MCG tablet Take 1 tablet (112 mcg total) by mouth daily. (Patient taking differently: Take 112 mcg by mouth daily before breakfast. ) 90 tablet 3  . lidocaine (LIDODERM) 5 % UNWRAP AND APPLY 1 PATCH TO SKIN DAILY FOR 15 DAYS. REMOVE AND DISCARD PATCH WITHIN 12 HOURS OR AS DIRECTED (Patient taking differently: Place 1 patch onto the skin daily as needed (pain). ) 15 patch 0  . Lifitegrast (XIIDRA) 5 % SOLN Place 1 drop into both eyes daily as needed (dry eyes).     Marland Kitchen linaclotide (LINZESS) 145 MCG CAPS capsule Take 145 mcg by mouth daily before breakfast.    . LORazepam (ATIVAN) 0.5 MG tablet Take 1 tablet (0.5 mg total) by mouth every 8 (eight) hours as needed for anxiety. 10 tablet 0  . Menthol (HALLS COUGH DROPS MT) Use as directed 1 drop in the mouth or throat as needed (cough).    . ondansetron (ZOFRAN) 4 MG tablet Take 1 tablet (4 mg total) by mouth every 8 (eight) hours as needed for nausea or vomiting. 20 tablet 2  . ONETOUCH DELICA LANCETS 59D MISC USE FOUR TIMES DAILY AS DIRECTED 500 each 0  . ONETOUCH VERIO test strip TEST TWICE DAILY 150  each 0  . predniSONE (DELTASONE) 20 MG tablet Take 20 mg by mouth 2 (two) times daily.  0  . pregabalin (LYRICA) 50 MG capsule Take 1 capsule (50 mg total) by mouth 2 (two) times daily as needed (nerve pain). 90 capsule 0  . ranitidine (ZANTAC) 150 MG tablet Take 1 tablet (150 mg total) by mouth 2 (two) times daily. (Patient taking differently: Take 150 mg by mouth 2 (two) times daily as needed for heartburn. ) 60 tablet 1  . rizatriptan (MAXALT-MLT) 10 MG disintegrating tablet Take 1 tablet earliest onset of headache.  May repeat once in 2 hours if needed (Patient taking differently: Take 10 mg by mouth See admin instructions. Take 1 tablet earliest onset of headache.  May repeat once in 2 hours if needed) 9 tablet 11  . Topiramate ER (TROKENDI XR) 100 MG CP24 Take 100 mg by mouth daily. 90 capsule 1  . TRULICITY 1.5 GL/8.7FI SOPN INJECT 1.5 MG INTO THE SKIN ONCE  WEEKLY (Patient taking differently: Inject 1.5 mg into the skin once a week. On Tuesday) 2 mL 1  . VICTOZA 18 MG/3ML SOPN ADMINISTER 1.2 MG UNDER THE SKIN DAILY (Patient taking differently: Inject 1.2 mg into the skin daily. ) 9 mL 0   No current facility-administered medications on file prior to visit.     ALLERGIES: Allergies  Allergen Reactions  . Ergotrate [Ergonovine] Other (See Comments)    Pt does not ever want  . Ibuprofen Other (See Comments)    Patient has ulcer  . Metformin And Related Other (See Comments)    Dizziness  . Yutopar [Ritodrine] Other (See Comments)    Pt does not ever want    FAMILY HISTORY: Family History  Problem Relation Age of Onset  . Hypertension Mother   . Diabetes Mother   . Cancer Mother        rectal  . Heart disease Father 10       MI x5  . Hypertension Father   . Diabetes Father   . Cancer Sister 39       breast  . Drug abuse Brother   . Alcohol abuse Brother   . Drug abuse Brother   . Alcohol abuse Brother   . Anxiety disorder Neg Hx   . Bipolar disorder Neg Hx   . Depression  Neg Hx    SOCIAL HISTORY: Social History   Socioeconomic History  . Marital status: Single    Spouse name: Not on file  . Number of children: 4  . Years of education: 40  . Highest education level: Not on file  Occupational History  . Occupation: Research scientist (physical sciences): [employer]  . Financial resource strain: Not on file  . Food insecurity:    Worry: Not on file    Inability: Not on file  . Transportation needs:    Medical: Not on file    Non-medical: Not on file  Tobacco Use  . Smoking status: Current Some Day Smoker    Packs/day: 0.50    Years: 30.00    Pack years: 15.00    Types: Cigarettes  . Smokeless tobacco: Never Used  Substance and Sexual Activity  . Alcohol use: No    Comment: rare  . 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  Today denies all drug abuse  . Sexual activity: Not Currently    Birth control/protection: Surgical  Lifestyle  . Physical activity:    Days per week: Not on file    Minutes per session: Not on file  . Stress: Not on file  Relationships  . Social connections:    Talks on phone: Not on file    Gets together: Not on file    Attends religious service: Not on file    Active member of club or organization: Not on file    Attends meetings of clubs or organizations: Not on file    Relationship status: Not on file  . Intimate partner violence:    Fear of current or ex partner: Not on file    Emotionally abused: Not on file    Physically abused: Not on file    Forced sexual activity: Not on file  Other Topics Concern  . Not on file  Social History Narrative   Lives alone in [place]. Her daugther occassoinally comes and stay  with her. 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  originally from [place],  [place].  Raised by mom and her sister who is 8 yrs older than  pt. Pt has several half siblings. No longer on disability. Pt has 4 kids, never married.           REVIEW OF SYSTEMS: Constitutional: No fevers, chills, or sweats, no generalized fatigue, change in appetite Eyes: No visual changes, double vision, eye pain Ear, nose and throat: No hearing loss, ear pain, nasal congestion, sore throat Cardiovascular: No chest pain, palpitations Respiratory:  No shortness of breath at rest or with exertion, wheezes GastrointestinaI: No nausea, vomiting, diarrhea, abdominal pain, fecal incontinence Genitourinary:  No dysuria, urinary retention or frequency Musculoskeletal:  No neck pain, back pain Integumentary: No rash, pruritus, skin lesions Neurological: as above Psychiatric: No depression, insomnia, anxiety Endocrine: No palpitations, fatigue, diaphoresis, mood swings, change in appetite, change in weight, increased thirst Hematologic/Lymphatic:  No purpura, petechiae. Allergic/Immunologic: no itchy/runny eyes, nasal congestion, recent allergic reactions, rashes  PHYSICAL EXAM: Blood pressure 136/74, pulse 78, height 5\' 4"  (1.626 m), weight 202 lb (91.6 kg), last menstrual period 12/06/2016, SpO2 99 %. General: No acute distress.  Patient appears well-groomed.   Head:  Normocephalic/atraumatic Eyes:  Fundi examined but not visualized Neck: supple, no paraspinal tenderness, full range of motion Heart:  Regular rate and rhythm Lungs:  Clear to auscultation bilaterally Back: No paraspinal tenderness Neurological Exam: alert and oriented to person, place, and time. Attention span and concentration intact, recent and remote memory intact, fund of knowledge intact.  Speech fluent and not dysarthric, language intact.  CN II-XII intact. Bulk and tone normal, muscle strength 5/5 throughout.  Sensation to light touch  intact.  Deep tendon reflexes 2+ throughout.  Finger to nose testing intact.  Gait normal, Romberg negative.  IMPRESSION:  migraine with and  without aura, not intractable, without status migrainosus HTN  PLAN: 1.  She will hold off on Emgality and continue topiramate ER 100mg  daily for now, since they have improved. 2.  Maxalt-MLT 10mg  for abortive therapy 3.  Limit use of pain relievers to no more than 2 days out of the week to prevent rebound headache 4.  Keep headache diary 5.  Follow up in 3 to 4 months  Metta Clines, DO  CC: Sherene Sires, DO

## 2018-03-10 ENCOUNTER — Encounter: Payer: Self-pay | Admitting: Endocrinology

## 2018-03-10 ENCOUNTER — Ambulatory Visit (INDEPENDENT_AMBULATORY_CARE_PROVIDER_SITE_OTHER): Payer: 59 | Admitting: Endocrinology

## 2018-03-10 ENCOUNTER — Ambulatory Visit (INDEPENDENT_AMBULATORY_CARE_PROVIDER_SITE_OTHER): Payer: 59 | Admitting: Neurology

## 2018-03-10 ENCOUNTER — Encounter: Payer: Self-pay | Admitting: Neurology

## 2018-03-10 VITALS — BP 142/76 | HR 94 | Ht 64.0 in | Wt 202.8 lb

## 2018-03-10 VITALS — BP 136/74 | HR 78 | Ht 64.0 in | Wt 202.0 lb

## 2018-03-10 DIAGNOSIS — R55 Syncope and collapse: Secondary | ICD-10-CM

## 2018-03-10 DIAGNOSIS — E119 Type 2 diabetes mellitus without complications: Secondary | ICD-10-CM | POA: Diagnosis not present

## 2018-03-10 LAB — POCT GLYCOSYLATED HEMOGLOBIN (HGB A1C): Hemoglobin A1C: 9.4 % — AB (ref 4.0–5.6)

## 2018-03-10 MED ORDER — INSULIN LISPRO 100 UNIT/ML (KWIKPEN): 5.0000 [IU] | PEN_INJECTOR | Freq: Three times a day (TID) | SUBCUTANEOUS | 11 refills | Status: DC

## 2018-03-10 MED ORDER — FLUCONAZOLE 150 MG PO TABS: 150.0000 mg | ORAL_TABLET | Freq: Once | ORAL | 0 refills | Status: AC

## 2018-03-10 NOTE — Telephone Encounter (Signed)
Pt was seen today, she said she did not get Rx.  [facility], spoke with Han[place]i, she said Rx for Trokendi 100 mg was picked up yesterday.  Called Pt, she said her daughter had picked it up for her, she was sorry for the confusion.

## 2018-03-10 NOTE — Patient Instructions (Addendum)
Please continue the same 2 medications. Also, take humalog, 5 units for any blood sugar in the 200's, and 10 units for any over 300.   Please call or message [place] next week, to tell us how the blood sugar is doing check your blood sugar once a day.  vary the time of day when you check, between before the 3 meals, and at bedtime.  also check if you have symptoms of your blood sugar being too high or too low.  please keep a record of the readings and bring it to your next appointment here (or you can bring the meter itself).  You can write it on any piece of paper.  please call us sooner if your blood sugar goes below 70, or if you have a lot of readings over 200.  I have sent a prescription to your pharmacy, for the thrush Please come back for a follow-up appointment in 2 months.

## 2018-03-10 NOTE — Patient Instructions (Addendum)
1.  Continue Trokendi XR 100mg  2.  Use Maxalt MLT when you get the migraine.  Limit use of pain relievers to no more than 2 days out of week to prevent risk of rebound or medication-overuse headache. 3.  Follow up with neurosurgery.

## 2018-03-10 NOTE — Progress Notes (Signed)
Subjective:    Patient ID: Kendra Hall, female    DOB: [birth year], 52 y.o.   MRN: [id number]  HPI Pt returns for f/u of diabetes mellitus:  DM type: 2.  Dx'ed: 5400 Complications: renal insuff.    Therapy: trulicity and victoza.  GDM: 1984.   DKA: never.   Severe hypoglycemia: once, in 2018.  Pancreatitis: never.  Other: metformin caused dizziness; she takes both trulicity and victoza, and she wants to continue; she took insulin 2012-2018.  she stopped invokana due to vaginal irritation. Interval history:  no cbg record, but states cbg's vary from 200-400.  She has been getting steroid rx, for AB.  She still has cough Past Medical History:  Diagnosis Date  . Allergy   . Anemia    history of  . Anxiety   . Asthma   . Depression   . Diabetes mellitus   . Fibromyalgia   . Gastric ulcer   . GERD (gastroesophageal reflux disease)   . Grave's disease   . Graves disease   . H/O therapeutic radiation   . Migraine   . Multiple thyroid nodules   . Myofacial muscle pain   . Osteoarthritis    Facet hypertrophy with injections  . TIA (transient ischemic attack)    02/2015    Past Surgical History:  Procedure Laterality Date  . AXILLARY LYMPH NODE DISSECTION  2001  . HYDRADENITIS EXCISION    . TUBAL LIGATION  1995  . VAGINAL HYSTERECTOMY Left 03/11/2017   Procedure: HYSTERECTOMY VAGINAL W/LEFT PROXIMAL SALPINGECTOMY;  Surgeon: Princess Bruins, MD;  Location: [facility];  Service: Gynecology;  Laterality: Left;  request 7:30am OR time  request 2 hours      Social History   Socioeconomic History  . Marital status: Single    Spouse name: Not on file  . Number of children: 4  . Years of education: 83  . Highest education level: Not on file  Occupational History  . Occupation: Research scientist (physical sciences): [employer]  . Financial resource strain: Not on file  . Food insecurity:    Worry: Not on file    Inability: Not on file  . Transportation  needs:    Medical: Not on file    Non-medical: Not on file  Tobacco Use  . Smoking status: Current Some Day Smoker    Packs/day: 0.50    Years: 30.00    Pack years: 15.00    Types: Cigarettes  . Smokeless tobacco: Never Used  Substance and Sexual Activity  . Alcohol use: No    Comment: rare  . Drug use: No    Comment: cocaine use daily for 3 yrs in her 1's. 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  Today denies all drug abuse  . Sexual activity: Not Currently    Birth control/protection: Surgical  Lifestyle  . Physical activity:    Days per week: Not on file    Minutes per session: Not on file  . Stress: Not on file  Relationships  . Social connections:    Talks on phone: Not on file    Gets together: Not on file    Attends religious service: Not on file    Active member of club or organization: Not on file    Attends meetings of clubs or organizations: Not on file    Relationship status: Not on file  . Intimate partner violence:    Fear of  current or ex partner: Not on file    Emotionally abused: Not on file    Physically abused: Not on file    Forced sexual activity: Not on file  Other Topics Concern  . Not on file  Social History Narrative   Lives alone in [place]. Her daugther occassoinally comes and stay with her. 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  originally from [place],  [place].  Raised by mom and her sister who is 8 yrs older than pt. Pt has several half siblings. No longer on disability. Pt has 4 kids, never married.           Current Outpatient Medications on File Prior to Visit  Medication Sig Dispense Refill  . albuterol (PROAIR HFA) 108 (90 Base) MCG/ACT inhaler Inhale 2 puffs into the lungs every 4 (four) hours as needed for wheezing. 1 Inhaler 2  . albuterol (PROVENTIL) (2.5 MG/3ML) 0.083% nebulizer solution Take 3 mLs by nebulization 4 (four) times daily as  needed for wheezing or shortness of breath.   3  . baclofen (LIORESAL) 10 MG tablet TAKE 1 TABLET(10 MG) BY MOUTH THREE TIMES DAILY AS NEEDED FOR MUSCLE SPASMS (Patient taking differently: Take 10 mg by mouth 3 (three) times daily as needed for muscle spasms. ) 270 tablet 0  . benzonatate (TESSALON) 100 MG capsule Take 1 capsule (100 mg total) by mouth 2 (two) times daily as needed for cough. 20 capsule 0  . Blood Glucose Calibration (ONETOUCH VERIO) SOLN Used to check meter accuracy 1 each 0  . DULoxetine (CYMBALTA) 30 MG capsule Take 1 capsule (30 mg total) by mouth 2 (two) times daily. 180 capsule 3  . HYDROMET 5-1.5 MG/5ML syrup Take 5 mLs by mouth 4 (four) times daily as needed for cough.   0  . levothyroxine (SYNTHROID, LEVOTHROID) 112 MCG tablet Take 1 tablet (112 mcg total) by mouth daily. (Patient taking differently: Take 112 mcg by mouth daily before breakfast. ) 90 tablet 3  . lidocaine (LIDODERM) 5 % UNWRAP AND APPLY 1 PATCH TO SKIN DAILY FOR 15 DAYS. REMOVE AND DISCARD PATCH WITHIN 12 HOURS OR AS DIRECTED (Patient taking differently: Place 1 patch onto the skin daily as needed (pain). ) 15 patch 0  . Lifitegrast (XIIDRA) 5 % SOLN Place 1 drop into both eyes daily as needed (dry eyes).     Marland Kitchen linaclotide (LINZESS) 145 MCG CAPS capsule Take 145 mcg by mouth daily before breakfast.    . LORazepam (ATIVAN) 0.5 MG tablet Take 1 tablet (0.5 mg total) by mouth every 8 (eight) hours as needed for anxiety. 10 tablet 0  . Menthol (HALLS COUGH DROPS MT) Use as directed 1 drop in the mouth or throat as needed (cough).    . ondansetron (ZOFRAN) 4 MG tablet Take 1 tablet (4 mg total) by mouth every 8 (eight) hours as needed for nausea or vomiting. 20 tablet 2  . ONETOUCH DELICA LANCETS 01U MISC USE FOUR TIMES DAILY AS DIRECTED 500 each 0  . ONETOUCH VERIO test strip TEST TWICE DAILY 150 each 0  . pregabalin (LYRICA) 50 MG capsule Take 1 capsule (50 mg total) by mouth 2 (two) times daily as needed (nerve  pain). 90 capsule 0  . rizatriptan (MAXALT-MLT) 10 MG disintegrating tablet Take 1 tablet earliest onset of headache.  May repeat once in 2 hours if needed (Patient taking differently: Take 10 mg by mouth See  admin instructions. Take 1 tablet earliest onset of headache.  May repeat once in 2 hours if needed) 9 tablet 11  . Topiramate ER (TROKENDI XR) 100 MG CP24 Take 100 mg by mouth daily. 90 capsule 1  . TRULICITY 1.5 FW/2.6VZ SOPN INJECT 1.5 MG INTO THE SKIN ONCE WEEKLY (Patient taking differently: Inject 1.5 mg into the skin once a week. On Tuesday) 2 mL 1  . VICTOZA 18 MG/3ML SOPN ADMINISTER 1.2 MG UNDER THE SKIN DAILY (Patient taking differently: Inject 1.2 mg into the skin daily. ) 9 mL 0  . ranitidine (ZANTAC) 150 MG tablet Take 1 tablet (150 mg total) by mouth 2 (two) times daily. (Patient not taking: Reported on 03/10/2018) 60 tablet 1   No current facility-administered medications on file prior to visit.     Allergies  Allergen Reactions  . Ergotrate [Ergonovine] Other (See Comments)    Pt does not ever want  . Ibuprofen Other (See Comments)    Patient has ulcer  . Metformin And Related Other (See Comments)    Dizziness  . Yutopar [Ritodrine] Other (See Comments)    Pt does not ever want    Family History  Problem Relation Age of Onset  . Hypertension Mother   . Diabetes Mother   . Cancer Mother        rectal  . Heart disease Father 62       MI x5  . Hypertension Father   . Diabetes Father   . Cancer Sister 28       breast  . Drug abuse Brother   . Alcohol abuse Brother   . Drug abuse Brother   . Alcohol abuse Brother   . Anxiety disorder Neg Hx   . Bipolar disorder Neg Hx   . Depression Neg Hx     BP (!) 142/76 (BP Location: Left Arm, Patient Position: Sitting)   Pulse 94   Ht 5\' 4"  (1.626 m)   Wt 202 lb 12.8 oz (92 kg)   LMP 12/06/2016 (Approximate)   SpO2 95%   BMI 34.81 kg/m    Review of Systems She denies hypoglycemia.  She says tongue is white      Objective:   Physical Exam VITAL SIGNS:  See vs page GENERAL: no distress Pulses: foot pulses are intact bilaterally.   MSK: no deformity of the feet or ankles.  CV: no edema of the legs or ankles Skin:  no ulcer on the feet or ankles.  normal color and temp on the feet and ankles Neuro: sensation is intact to touch on the feet and ankles.   Ext: There is bilateral onychomycosis of the toenails.    Lab Results  Component Value Date   HGBA1C 9.4 (A) 03/10/2018   Lab Results  Component Value Date   CREATININE 1.31 (H) 03/06/2018   BUN 24 (H) 03/06/2018   NA 136 03/06/2018   K 5.1 03/06/2018   CL 94 (L) 03/06/2018   CO2 29 03/06/2018       Assessment & Plan:  Type 2 DM, with renal insuff: worse AB, new: this is affecting a1c Oral discoloration, new  Patient Instructions  Please continue the same 2 medications. Also, take humalog, 5 units for any blood sugar in the 200's, and 10 units for any over 300.   Please call or message [place] next week, to tell us how the blood sugar is doing check your blood sugar once a day.  vary the time of day when  you check, between before the 3 meals, and at bedtime.  also check if you have symptoms of your blood sugar being too high or too low.  please keep a record of the readings and bring it to your next appointment here (or you can bring the meter itself).  You can write it on any piece of paper.  please call us sooner if your blood sugar goes below 70, or if you have a lot of readings over 200.  I have sent a prescription to your pharmacy, for the thrush Please come back for a follow-up appointment in 2 months.

## 2018-03-24 ENCOUNTER — Encounter: Payer: Self-pay | Admitting: Endocrinology

## 2018-03-24 ENCOUNTER — Ambulatory Visit (INDEPENDENT_AMBULATORY_CARE_PROVIDER_SITE_OTHER): Payer: 59 | Admitting: Endocrinology

## 2018-03-24 VITALS — BP 126/72 | HR 82 | Ht 64.0 in | Wt 200.8 lb

## 2018-03-24 DIAGNOSIS — E119 Type 2 diabetes mellitus without complications: Secondary | ICD-10-CM | POA: Diagnosis not present

## 2018-03-24 LAB — GLUCOSE, POCT (MANUAL RESULT ENTRY): POC Glucose: 334 mg/dl — AB (ref 70–99)

## 2018-03-24 MED ORDER — GLIMEPIRIDE 2 MG PO TABS: 2.0000 mg | ORAL_TABLET | Freq: Every day | ORAL | 3 refills | Status: DC

## 2018-03-24 MED ORDER — LIRAGLUTIDE 18 MG/3ML ~~LOC~~ SOPN: 1.2000 mg | PEN_INJECTOR | Freq: Every day | SUBCUTANEOUS | 11 refills | Status: DC

## 2018-03-24 NOTE — Progress Notes (Signed)
Subjective:    Patient ID: Kendra Hall, female    DOB: 1966-04-21, 52 y.o.   MRN: [id number]  HPI Pt returns for f/u of diabetes mellitus:  DM type: 2.  Dx'ed: 4128 Complications: renal insuff.    Therapy: trulicity and victoza.  GDM: 1984.   DKA: never.   Severe hypoglycemia: once, in 2018.  Pancreatitis: never.  Other: metformin caused dizziness; she takes both trulicity and victoza, and she wants to continue; she took insulin 2012-2018.  she stopped invokana due to vaginal irritation.  Interval history:  no cbg record, but states cbg's vary from 174-200's, despite taking PRN humalog.  No recent steroids.  She still has vaginal sxs.  Past Medical History:  Diagnosis Date  . Allergy   . Anemia    history of  . Anxiety   . Asthma   . Depression   . Diabetes mellitus   . Fibromyalgia   . Gastric ulcer   . GERD (gastroesophageal reflux disease)   . Grave's disease   . Graves disease   . H/O therapeutic radiation   . Migraine   . Multiple thyroid nodules   . Myofacial muscle pain   . Osteoarthritis    Facet hypertrophy with injections  . TIA (transient ischemic attack)    02/2015    Past Surgical History:  Procedure Laterality Date  . AXILLARY LYMPH NODE DISSECTION  2001  . HYDRADENITIS EXCISION    . TUBAL LIGATION  1995  . VAGINAL HYSTERECTOMY Left 03/11/2017   Procedure: HYSTERECTOMY VAGINAL W/LEFT PROXIMAL SALPINGECTOMY;  Surgeon: Princess Bruins, MD;  Location: [facility];  Service: Gynecology;  Laterality: Left;  request 7:30am OR time  request 2 hours      Social History   Socioeconomic History  . Marital status: Single    Spouse name: Not on file  . Number of children: 4  . Years of education: 71  . Highest education level: Not on file  Occupational History  . Occupation: Research scientist (physical sciences): [employer]  . Financial resource strain: Not on file  . Food insecurity:    Worry: Not on file    Inability: Not on file    . Transportation needs:    Medical: Not on file    Non-medical: Not on file  Tobacco Use  . Smoking status: Current Some Day Smoker    Packs/day: 0.50    Years: 30.00    Pack years: 15.00    Types: Cigarettes  . Smokeless tobacco: Never Used  Substance and Sexual Activity  . Alcohol use: No    Comment: rare  . 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  Today denies all drug abuse  . Sexual activity: Not Currently    Birth control/protection: Surgical  Lifestyle  . Physical activity:    Days per week: Not on file    Minutes per session: Not on file  . Stress: Not on file  Relationships  . Social connections:    Talks on phone: Not on file    Gets together: Not on file    Attends religious service: Not on file    Active member of club or organization: Not on file    Attends meetings of clubs or organizations: Not on file    Relationship status: Not on file  . Intimate partner violence:  Fear of current or ex partner: Not on file    Emotionally abused: Not on file    Physically abused: Not on file    Forced sexual activity: Not on file  Other Topics Concern  . Not on file  Social History Narrative   Lives alone in [place]. Her daugther occassoinally comes and stay with her. 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  originally from [place],  [place].  Raised by mom and her sister who is 8 yrs older than pt. Pt has several half siblings. No longer on disability. Pt has 4 kids, never married.           Current Outpatient Medications on File Prior to Visit  Medication Sig Dispense Refill  . ADVAIR HFA 45-21 MCG/ACT inhaler INHALE 1 INHALATION D  0  . albuterol (PROAIR HFA) 108 (90 Base) MCG/ACT inhaler Inhale 2 puffs into the lungs every 4 (four) hours as needed for wheezing. 1 Inhaler 2  . albuterol (PROVENTIL) (2.5 MG/3ML)  0.083% nebulizer solution Take 3 mLs by nebulization 4 (four) times daily as needed for wheezing or shortness of breath.   3  . baclofen (LIORESAL) 10 MG tablet TAKE 1 TABLET(10 MG) BY MOUTH THREE TIMES DAILY AS NEEDED FOR MUSCLE SPASMS (Patient taking differently: Take 10 mg by mouth 3 (three) times daily as needed for muscle spasms. ) 270 tablet 0  . benzonatate (TESSALON) 100 MG capsule Take 1 capsule (100 mg total) by mouth 2 (two) times daily as needed for cough. 20 capsule 0  . Blood Glucose Calibration (ONETOUCH VERIO) SOLN Used to check meter accuracy 1 each 0  . Blood Glucose Monitoring Suppl (ONETOUCH VERIO) w/Device KIT     . DULoxetine (CYMBALTA) 30 MG capsule Take 1 capsule (30 mg total) by mouth 2 (two) times daily. 180 capsule 3  . Esomeprazole Magnesium (NEXIUM PO) Take by mouth.    . insulin lispro (HUMALOG KWIKPEN) 100 UNIT/ML KiwkPen Inject 0.05-0.1 mLs (5-10 Units total) into the skin 3 (three) times daily with meals. 5 units for any blood sugar in the 200's, and 10 units if over 300.  And pen needles 3/day 15 mL 11  . levothyroxine (SYNTHROID, LEVOTHROID) 112 MCG tablet Take 1 tablet (112 mcg total) by mouth daily. (Patient taking differently: Take 112 mcg by mouth daily before breakfast. ) 90 tablet 3  . lidocaine (LIDODERM) 5 % UNWRAP AND APPLY 1 PATCH TO SKIN DAILY FOR 15 DAYS. REMOVE AND DISCARD PATCH WITHIN 12 HOURS OR AS DIRECTED (Patient taking differently: Place 1 patch onto the skin daily as needed (pain). ) 15 patch 0  . Lifitegrast (XIIDRA) 5 % SOLN Place 1 drop into both eyes daily as needed (dry eyes).     Marland Kitchen linaclotide (LINZESS) 145 MCG CAPS capsule Take 145 mcg by mouth daily before breakfast.    . LORazepam (ATIVAN) 0.5 MG tablet Take 1 tablet (0.5 mg total) by mouth every 8 (eight) hours as needed for anxiety. 10 tablet 0  . Menthol (HALLS COUGH DROPS MT) Use as directed 1 drop in the mouth or throat as needed (cough).    . montelukast (SINGULAIR) 10 MG tablet TK 1  T PO D  3  . ondansetron (ZOFRAN) 4 MG tablet Take 1 tablet (4 mg total) by mouth every 8 (eight) hours as needed for nausea or vomiting. 20 tablet 2  . ONETOUCH DELICA LANCETS 56D MISC USE FOUR  TIMES DAILY AS DIRECTED 500 each 0  . ONETOUCH VERIO test strip TEST TWICE DAILY 150 each 0  . pregabalin (LYRICA) 50 MG capsule Take 1 capsule (50 mg total) by mouth 2 (two) times daily as needed (nerve pain). 90 capsule 0  . ranitidine (ZANTAC) 150 MG tablet Take 1 tablet (150 mg total) by mouth 2 (two) times daily. 60 tablet 1  . rizatriptan (MAXALT-MLT) 10 MG disintegrating tablet Take 1 tablet earliest onset of headache.  May repeat once in 2 hours if needed (Patient taking differently: Take 10 mg by mouth See admin instructions. Take 1 tablet earliest onset of headache.  May repeat once in 2 hours if needed) 9 tablet 11  . Topiramate ER (TROKENDI XR) 100 MG CP24 Take 100 mg by mouth daily. 90 capsule 1  . TRULICITY 1.5 YB/0.1BP SOPN INJECT 1.5 MG INTO THE SKIN ONCE WEEKLY (Patient taking differently: Inject 1.5 mg into the skin once a week. On Tuesday) 2 mL 1   No current facility-administered medications on file prior to visit.     Allergies  Allergen Reactions  . Ergotrate [Ergonovine] Other (See Comments)    Pt does not ever want  . Ibuprofen Other (See Comments)    Patient has ulcer  . Metformin And Related Other (See Comments)    Dizziness  . Yutopar [Ritodrine] Other (See Comments)    Pt does not ever want    Family History  Problem Relation Age of Onset  . Hypertension Mother   . Diabetes Mother   . Cancer Mother        rectal  . Heart disease Father 62       MI x5  . Hypertension Father   . Diabetes Father   . Cancer Sister 66       breast  . Drug abuse Brother   . Alcohol abuse Brother   . Drug abuse Brother   . Alcohol abuse Brother   . Anxiety disorder Neg Hx   . Bipolar disorder Neg Hx   . Depression Neg Hx     BP 126/72 (BP Location: Right Arm)   Pulse 82    Ht _0  (1.626 m)   Wt 200 lb 12.8 oz (91.1 kg)   LMP 12/06/2016 (Approximate)   SpO2 98%   BMI 34.47 kg/m    Review of Systems She denies hypoglycemia.      Objective:   Physical Exam VITAL SIGNS:  See vs page.   GENERAL: no distress Pulses: dorsalis pedis intact bilat.   MSK: no deformity of the feet CV: no leg edema Skin:  no ulcer on the feet.  normal color and temp on the feet.  Neuro: sensation is intact to touch on the feet.    Lab Results  Component Value Date   CREATININE 1.31 (H) 03/06/2018   BUN 24 (H) 03/06/2018   NA 136 03/06/2018   K 5.1 03/06/2018   CL 94 (L) 03/06/2018   CO2 29 03/06/2018   Lab Results  Component Value Date   HGBA1C 9.4 (A) 03/10/2018      Assessment & Plan:  Type 2 DM: worse Renal insuff: this limits amaryl dosage Vaginal sxs: I advised pt to se PCP for this Dizziness, by hx.  This limits rx options.    Patient Instructions  I have sent a prescription to your pharmacy, to add "glimepiride." Please continue the same other diabetes medications Also, take humalog, 5 units for any blood sugar in the  200's, and 10 units for any over 300.  check your blood sugar once a day.  vary the time of day when you check, between before the 3 meals, and at bedtime.  also check if you have symptoms of your blood sugar being too high or too low.  please keep a record of the readings and bring it to your next appointment here (or you can bring the meter itself).  You can write it on any piece of paper.  please call us sooner if your blood sugar goes below 70, or if you have a lot of readings over 200.  Please come back for a follow-up appointment in 2 months.

## 2018-03-24 NOTE — Patient Instructions (Addendum)
I have sent a prescription to your pharmacy, to add "glimepiride." Please continue the same other diabetes medications Also, take humalog, 5 units for any blood sugar in the 200's, and 10 units for any over 300.  check your blood sugar once a day.  vary the time of day when you check, between before the 3 meals, and at bedtime.  also check if you have symptoms of your blood sugar being too high or too low.  please keep a record of the readings and bring it to your next appointment here (or you can bring the meter itself).  You can write it on any piece of paper.  please call us sooner if your blood sugar goes below 70, or if you have a lot of readings over 200.  Please come back for a follow-up appointment in 2 months.

## 2018-03-25 ENCOUNTER — Ambulatory Visit: Payer: 59 | Admitting: Endocrinology

## 2018-03-31 ENCOUNTER — Telehealth: Payer: Self-pay | Admitting: Neurology

## 2018-03-31 NOTE — Telephone Encounter (Signed)
Nothing written in the note about ordering an MR. Last note states holding on Emgality. Dr. Tomi Likens please advise.

## 2018-03-31 NOTE — Telephone Encounter (Signed)
Patient is calling in stating she needs a prior auth done for the migraine shot. She thinks its the amovig medication. She is still using the [facility] on [address]. She also wants to schedule her MRI soon. Those orders need to be sent. Please call her back at [phone]. Thanks!

## 2018-04-01 MED ORDER — GALCANEZUMAB-GNLM 120 MG/ML ~~LOC~~ SOAJ: 1.0000 mL | SUBCUTANEOUS | 5 refills | Status: DC

## 2018-04-01 NOTE — Telephone Encounter (Signed)
I would like to start her now on Emgality.

## 2018-04-01 NOTE — Telephone Encounter (Signed)
Emgality sent to the pharmacy.

## 2018-04-01 NOTE — Telephone Encounter (Signed)
Patient made aware.

## 2018-04-01 NOTE — Telephone Encounter (Signed)
Spoke with patient.  1. She states she has spinal stenosis and was sent to [facility] from [facility] (was seen in ER). They ordered MRI on her but it was denied by insurance. She wanted to see if Dr. Tomi Likens would have the notes to justify to the insurance that this is needed. I let patient know that Dr. Tomi Likens doesn't treat spinal stenosis and her notes here wouldn't reflect a reason for the MRI. She will follow up concerning that with Neurosurgery.   2. She states when she was last seen in our office she had had long term pneumonia and was on a lot of different medications that could help her headaches, she states that is why emgality was held at that time. She states since she has gotten better and is off those medications her headaches are worse and she wants to start the headache injection. Please advise.

## 2018-04-01 NOTE — Telephone Encounter (Signed)
I am not aware of indication for MRI

## 2018-04-06 ENCOUNTER — Ambulatory Visit: Payer: 59

## 2018-04-08 ENCOUNTER — Ambulatory Visit: Payer: 59 | Admitting: Neurology

## 2018-04-08 ENCOUNTER — Encounter

## 2018-04-08 ENCOUNTER — Ambulatory Visit
Admission: RE | Admit: 2018-04-08 | Discharge: 2018-04-08 | Disposition: A | Discharge status: Home / Self Care | Payer: 59 | Source: Ambulatory Visit | Attending: Nurse Practitioner | Admitting: Nurse Practitioner

## 2018-04-08 DIAGNOSIS — Z1231 Encounter for screening mammogram for malignant neoplasm of breast: Secondary | ICD-10-CM

## 2018-04-10 NOTE — Progress Notes (Signed)
Submitted on cover my meds

## 2018-04-17 ENCOUNTER — Other Ambulatory Visit: Payer: Self-pay | Admitting: Endocrinology

## 2018-04-22 ENCOUNTER — Other Ambulatory Visit: Payer: Self-pay

## 2018-04-22 ENCOUNTER — Telehealth: Payer: Self-pay | Admitting: Endocrinology

## 2018-04-22 ENCOUNTER — Telehealth: Payer: Self-pay

## 2018-04-22 MED ORDER — DULAGLUTIDE 1.5 MG/0.5ML ~~LOC~~ SOAJ: 1.5000 mg | SUBCUTANEOUS | 2 refills | Status: DC

## 2018-04-22 MED ORDER — GLUCOSE BLOOD VI STRP: 1.0000 | ORAL_STRIP | Freq: Two times a day (BID) | 0 refills | Status: DC

## 2018-04-22 NOTE — Telephone Encounter (Signed)
°  Patient is needing the following sent in to the pharmacy.  \ Patient is also inquiring about having STATEN done and would like to know if this is something Dr Loanne Drilling could do for her    Dulaglutide (TRULICITY) 1.5 BT/2.4EL [place]  [address] [place] [place], [address] AT [place] test strip  [address], [address] [place]

## 2018-04-22 NOTE — Telephone Encounter (Signed)
Returned pt call. Needed to await Dr. Cordelia Pen response re: statin tx't before we could call her back.

## 2018-04-22 NOTE — Telephone Encounter (Signed)
Patient requests a call at Ph# [phone]. Someone from our office called her phone but did not leave a message. Patient gives permission to leave detailed message if she does not answer her phone.

## 2018-04-22 NOTE — Telephone Encounter (Signed)
That is a ? for PCP

## 2018-04-22 NOTE — Telephone Encounter (Signed)
Rcvd call from [place] at [employer]. Pt picked up Emgality, loading does is 2 pens (120 x2) she only rcvd 1 pen. I called and spoke with Pt, Pt will come and pick up sample of one pen.

## 2018-04-22 NOTE — Telephone Encounter (Signed)
Trulicity refill request was sent successfully to [facility] 04/17/18. One touch test strips sent uccessfully to [facility] today.   Please advise re: pt question pertaining to statin therapy. Is this something you would want to manage?

## 2018-04-23 NOTE — Telephone Encounter (Signed)
Called pt and informed her to discuss statin therapy with her PCP. Verbalized acceptance and understanding.

## 2018-04-24 ENCOUNTER — Other Ambulatory Visit: Payer: Self-pay | Admitting: Family Medicine

## 2018-04-24 DIAGNOSIS — M79602 Pain in left arm: Secondary | ICD-10-CM

## 2018-05-06 ENCOUNTER — Telehealth: Payer: Self-pay | Admitting: Neurology

## 2018-05-06 NOTE — Telephone Encounter (Signed)
Called and LMOVM for Pt to return my call 

## 2018-05-06 NOTE — Telephone Encounter (Signed)
Patient is calling in stating she is waking up with migraines about 5 out of 7 days a week on average. Lasting about 2 hours. Please call her back at [phone]. Thanks!

## 2018-05-08 ENCOUNTER — Encounter: Payer: Self-pay | Admitting: Neurology

## 2018-05-08 ENCOUNTER — Ambulatory Visit (INDEPENDENT_AMBULATORY_CARE_PROVIDER_SITE_OTHER): Payer: 59 | Admitting: Neurology

## 2018-05-08 VITALS — BP 148/90 | HR 89 | Ht 63.75 in | Wt 206.0 lb

## 2018-05-08 DIAGNOSIS — G43009 Migraine without aura, not intractable, without status migrainosus: Secondary | ICD-10-CM | POA: Diagnosis not present

## 2018-05-08 NOTE — Progress Notes (Signed)
NEUROLOGY FOLLOW UP OFFICE NOTE  Kendra Hall [id number]  HISTORY OF PRESENT ILLNESS: Kendra Hall is a 52 year old female with type 2 diabetes mellitus, fibromyalgia, depression, hypothyroidism who follows up for migraines.  UPDATE: After last visit, headaches started getting worse.  She has been waking up with them and more recently falls to sleep with headache.  On the back of her head on the left, she reports sensation of water dripping down her head.  Due to worsening headaches, she started [employer].  She stopped topiramate because she thought she was supposed to.  She has been feeling anxious.  She reports 2 brief episodes of dizziness.  She also reports tingling in her feet last night while in bed. Intensity:  severe Duration:  When she wakes up, they last 1.5 to 2 hours Frequency:  Daily Frequency of abortive medication: daily Current NSAIDS:  none Current analgesics:  Hydrocodone most days for fibromyalgia but has been treating headaches with them as well Current triptans: Maxalt MLT 10 mg Current ergotamine: None Current anti-emetic: Promethazine 25 mg Current muscle relaxants:  baclofen Current anti-anxiolytic:  BuSpar Current sleep aide: None Current Antihypertensive medications: Would not use beta-blocker due to asthma Current Antidepressant medications:  Cymbalta, Lyrica Current Anticonvulsant medications: Topiramate ER 100 mg (effective) Current anti-CGRP:  Emgality (first dose 10/31) Current Vitamins/Herbal/Supplements: None Current Antihistamines/Decongestants: None Other therapy: None  Caffeine: No Diet: Drinks Exercise: Not routine Depression: Yes; Anxiety: Yes Other pain: Neck pain, fibromyalgia Sleep hygiene: Good  She has spinal stenosis in the neck.  CT cervical spine from 01/09/18 showed cervical spondylosis with degenerative changes greatest at C4-C7 levels with bilateral neural foraminal stenosis, as well as mild multifactorial C5-6 canal stenosis.   She is treated by pain management.   PAST MEDICAL HISTORY: Past Medical History:  Diagnosis Date  . Allergy   . Anemia    history of  . Anxiety   . Asthma   . Depression   . Diabetes mellitus   . Fibromyalgia   . Gastric ulcer   . GERD (gastroesophageal reflux disease)   . Grave's disease   . Graves disease   . H/O therapeutic radiation   . Migraine   . Multiple thyroid nodules   . Myofacial muscle pain   . Osteoarthritis    Facet hypertrophy with injections  . TIA (transient ischemic attack)    02/2015    MEDICATIONS: Current Outpatient Medications on File Prior to Visit  Medication Sig Dispense Refill  . ADVAIR HFA 45-21 MCG/ACT inhaler INHALE 1 INHALATION D  0  . albuterol (PROAIR HFA) 108 (90 Base) MCG/ACT inhaler Inhale 2 puffs into the lungs every 4 (four) hours as needed for wheezing. 1 Inhaler 2  . albuterol (PROVENTIL) (2.5 MG/3ML) 0.083% nebulizer solution Take 3 mLs by nebulization 4 (four) times daily as needed for wheezing or shortness of breath.   3  . baclofen (LIORESAL) 10 MG tablet TAKE 1 TABLET BY MOUTH THREE TIMES DAILY AS NEEDED FOR MUSCLE SPASMS 270 tablet 0  . benzonatate (TESSALON) 100 MG capsule Take 1 capsule (100 mg total) by mouth 2 (two) times daily as needed for cough. 20 capsule 0  . Blood Glucose Calibration (ONETOUCH VERIO) SOLN Used to check meter accuracy 1 each 0  . Blood Glucose Monitoring Suppl (ONETOUCH VERIO) w/Device KIT     . Dulaglutide (TRULICITY) 1.5 TK/3.5WS SOPN Inject 1.5 mg into the skin once a week. On Tuesday 2 mL 2  . DULoxetine (CYMBALTA) 30 MG  capsule Take 1 capsule (30 mg total) by mouth 2 (two) times daily. 180 capsule 3  . Esomeprazole Magnesium (NEXIUM PO) Take by mouth.    . Galcanezumab-gnlm (EMGALITY) 120 MG/ML SOAJ Inject 1 mL into the skin every 30 (thirty) days. 1 pen 5  . glimepiride (AMARYL) 2 MG tablet Take 1 tablet (2 mg total) by mouth daily before breakfast. 30 tablet 3  . glucose blood (ONETOUCH VERIO)  test strip 1 each by Other route 2 (two) times daily. use for testing 150 each 0  . insulin lispro (HUMALOG KWIKPEN) 100 UNIT/ML KiwkPen Inject 0.05-0.1 mLs (5-10 Units total) into the skin 3 (three) times daily with meals. 5 units for any blood sugar in the 200's, and 10 units if over 300.  And pen needles 3/day 15 mL 11  . levothyroxine (SYNTHROID, LEVOTHROID) 112 MCG tablet Take 1 tablet (112 mcg total) by mouth daily. (Patient taking differently: Take 112 mcg by mouth daily before breakfast. ) 90 tablet 3  . lidocaine (LIDODERM) 5 % UNWRAP AND APPLY 1 PATCH TO SKIN DAILY FOR 15 DAYS. REMOVE AND DISCARD PATCH WITHIN 12 HOURS OR AS DIRECTED (Patient taking differently: Place 1 patch onto the skin daily as needed (pain). ) 15 patch 0  . Lifitegrast (XIIDRA) 5 % SOLN Place 1 drop into both eyes daily as needed (dry eyes).     Marland Kitchen linaclotide (LINZESS) 145 MCG CAPS capsule Take 145 mcg by mouth daily before breakfast.    . liraglutide (VICTOZA) 18 MG/3ML SOPN Inject 0.2 mLs (1.2 mg total) into the skin daily. 3 pen 11  . LORazepam (ATIVAN) 0.5 MG tablet Take 1 tablet (0.5 mg total) by mouth every 8 (eight) hours as needed for anxiety. 10 tablet 0  . Menthol (HALLS COUGH DROPS MT) Use as directed 1 drop in the mouth or throat as needed (cough).    . montelukast (SINGULAIR) 10 MG tablet TK 1 T PO D  3  . ondansetron (ZOFRAN) 4 MG tablet Take 1 tablet (4 mg total) by mouth every 8 (eight) hours as needed for nausea or vomiting. 20 tablet 2  . ONETOUCH DELICA LANCETS 16P MISC USE FOUR TIMES DAILY AS DIRECTED 500 each 0  . pregabalin (LYRICA) 50 MG capsule Take 1 capsule (50 mg total) by mouth 2 (two) times daily as needed (nerve pain). 90 capsule 0  . ranitidine (ZANTAC) 150 MG tablet Take 1 tablet (150 mg total) by mouth 2 (two) times daily. 60 tablet 1  . rizatriptan (MAXALT-MLT) 10 MG disintegrating tablet Take 1 tablet earliest onset of headache.  May repeat once in 2 hours if needed (Patient taking  differently: Take 10 mg by mouth See admin instructions. Take 1 tablet earliest onset of headache.  May repeat once in 2 hours if needed) 9 tablet 11  . Topiramate ER (TROKENDI XR) 100 MG CP24 Take 100 mg by mouth daily. 90 capsule 1   No current facility-administered medications on file prior to visit.     ALLERGIES: Allergies  Allergen Reactions  . Ergotrate [Ergonovine] Other (See Comments)    Pt does not ever want  . Ibuprofen Other (See Comments)    Patient has ulcer  . Metformin And Related Other (See Comments)    Dizziness  . Yutopar [Ritodrine] Other (See Comments)    Pt does not ever want    FAMILY HISTORY: Family History  Problem Relation Age of Onset  . Hypertension Mother   . Diabetes Mother   .  Cancer Mother        rectal  . Heart disease Father 31       MI x5  . Hypertension Father   . Diabetes Father   . Drug abuse Brother   . Alcohol abuse Brother   . Breast cancer Sister        unsure of age , was between 63 and 31  . Drug abuse Brother   . Alcohol abuse Brother   . Anxiety disorder Neg Hx   . Bipolar disorder Neg Hx   . Depression Neg Hx     SOCIAL HISTORY: Social History   Socioeconomic History  . Marital status: Single    Spouse name: Not on file  . Number of children: 4  . Years of education: 47  . Highest education level: Not on file  Occupational History  . Occupation: Research scientist (physical sciences): [employer]  . Financial resource strain: Not on file  . Food insecurity:    Worry: Not on file    Inability: Not on file  . Transportation needs:    Medical: Not on file    Non-medical: Not on file  Tobacco Use  . Smoking status: Current Some Day Smoker    Packs/day: 0.50    Years: 30.00    Pack years: 15.00    Types: Cigarettes  . Smokeless tobacco: Never Used  Substance and Sexual Activity  . Alcohol use: No    Comment: rare  . 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  Today denies all drug abuse  . Sexual activity: Not Currently    Birth control/protection: Surgical  Lifestyle  . Physical activity:    Days per week: Not on file    Minutes per session: Not on file  . Stress: Not on file  Relationships  . Social connections:    Talks on phone: Not on file    Gets together: Not on file    Attends religious service: Not on file    Active member of club or organization: Not on file    Attends meetings of clubs or organizations: Not on file    Relationship status: Not on file  . Intimate partner violence:    Fear of current or ex partner: Not on file    Emotionally abused: Not on file    Physically abused: Not on file    Forced sexual activity: Not on file  Other Topics Concern  . Not on file  Social History Narrative   Lives alone in [place]. Her daugther occassoinally comes and stay with her. 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  originally from [place],  [place].  Raised by mom and her sister who is 8 yrs older than pt. Pt has several half siblings. No longer on disability. Pt has 4 kids, never married.           REVIEW OF SYSTEMS: Constitutional: No fevers, chills, or sweats, no generalized fatigue, change in appetite Eyes: No visual changes, double vision, eye pain Ear, nose and throat: No hearing loss, ear pain, nasal congestion, sore throat Cardiovascular: No chest pain, palpitations Respiratory:  No shortness of breath at rest or with exertion, wheezes GastrointestinaI: No nausea, vomiting, diarrhea, abdominal pain, fecal incontinence Genitourinary:  No dysuria, urinary retention  or frequency Musculoskeletal:  Neck pain Integumentary: No rash, pruritus, skin lesions Neurological: as above Psychiatric: depression,anxiety Endocrine: No palpitations, fatigue, diaphoresis, mood swings, change in appetite, change in weight,  increased thirst Hematologic/Lymphatic:  No purpura, petechiae. Allergic/Immunologic: no itchy/runny eyes, nasal congestion, recent allergic reactions, rashes  PHYSICAL EXAM: Blood pressure (!) 148/90, pulse 89, height 5' 3.75" (1.619 m), weight 206 lb (93.4 kg), last menstrual period 12/06/2016, SpO2 98 %. General: No acute distress.  Patient appears well-groomed.   Head:  Normocephalic/atraumatic Eyes:  Fundi examined but not visualized Neck: supple, paraspinal tenderness, swelling over the right trapezius, full range of motion Heart:  Regular rate and rhythm Lungs:  Clear to auscultation bilaterally Back: No paraspinal tenderness Neurological Exam: alert and oriented to person, place, and time. Attention span and concentration intact, recent and remote memory intact, fund of knowledge intact.  Speech fluent and not dysarthric, language intact.  CN II-XII intact. Bulk and tone normal, muscle strength 5/5 throughout.  Sensation to light touch  intact.  Deep tendon reflexes 2+ throughout, toes downgoing.  Finger to nose testing intact.  Gait normal, Romberg negative.  IMPRESSION: Migraine without aura, without status migrainosus, not intractable.  Increased frequency lately.  May be related to ongoing neck pain.  PLAN: 1.  She will continue Emgality.  She was not supposed to stop topiramate.  At this time she will remain off of topiramate.  When she follows up and headaches not significantly improved, we will restart topiramate in addition to [employer]. 2.  Maxalt MLT 10 mg for abortive therapy 3.  Limit use of pain relievers to no more than 2 days out of week to prevent risk of rebound or medication-overuse headache. 4.  Keep headache diary 5.  Follow up with pain management 6.  Increase water intake, increase exercise 7.  Follow up in 3 to 4 months.  Kendra Clines, DO  CC: Simona Huh, NP

## 2018-05-08 NOTE — Patient Instructions (Addendum)
1.  Continue the Emgality 2.  Use rizatriptan as needed for acute migraines 3.  Try to limit use of pain relievers to no more than 2 days out of week to prevent risk of rebound or medication-overuse headache. 4.   Keep headache diary 5.  Follow up in 3 to 4 months.  Contact us with any concerns.

## 2018-05-11 ENCOUNTER — Ambulatory Visit: Payer: 59 | Admitting: Endocrinology

## 2018-05-26 ENCOUNTER — Telehealth: Payer: Self-pay | Admitting: Nurse Practitioner

## 2018-05-26 ENCOUNTER — Ambulatory Visit: Payer: 59 | Admitting: Endocrinology

## 2018-05-26 NOTE — Telephone Encounter (Signed)
Spoke with pt's daughter over the phone. I briefly mentioned it was just a call to discuss an overdue colonoscopy for their mom and she took down [facility] contact information so she could help get her mom an appointment set up when she gets home. -[place]

## 2018-05-27 ENCOUNTER — Telehealth: Payer: Self-pay | Admitting: Neurology

## 2018-05-27 NOTE — Telephone Encounter (Signed)
Called Pt, LMOVM for her to return my call  Lanny Cramp, LMOVM

## 2018-05-27 NOTE — Telephone Encounter (Signed)
Colleen from [facility] Case Manager called stating that the patient has had her emgality 120mg  out of the fridge for 2 weeks now so it is expired and she said that she is 3 days past her not taking it. She was asking if you had samples to give her. Please call back at [phone] ext [phone]. Thanks!

## 2018-05-28 NOTE — Telephone Encounter (Signed)
Colleen rtrnd call, advised her Pt may come for sample

## 2018-06-01 ENCOUNTER — Encounter: Payer: Self-pay | Admitting: Endocrinology

## 2018-06-01 ENCOUNTER — Ambulatory Visit (INDEPENDENT_AMBULATORY_CARE_PROVIDER_SITE_OTHER): Payer: 59 | Admitting: Endocrinology

## 2018-06-01 VITALS — BP 136/82 | HR 97 | Ht 64.0 in | Wt 202.2 lb

## 2018-06-01 DIAGNOSIS — E119 Type 2 diabetes mellitus without complications: Secondary | ICD-10-CM

## 2018-06-01 LAB — POCT GLYCOSYLATED HEMOGLOBIN (HGB A1C): Hemoglobin A1C: 8.2 % — AB (ref 4.0–5.6)

## 2018-06-01 MED ORDER — LIRAGLUTIDE 18 MG/3ML ~~LOC~~ SOPN: 1.2000 mg | PEN_INJECTOR | Freq: Every day | SUBCUTANEOUS | 11 refills | Status: DC

## 2018-06-01 NOTE — Progress Notes (Signed)
Subjective:    Patient ID: Kendra Hall, female    DOB: 1965/07/14, 52 y.o.   MRN: [id number]  HPI Pt returns for f/u of diabetes mellitus:  DM type: 2.  Dx'ed: 7517 Complications: renal insuff.    Therapy: trulicity, victoza, and glimepiride.  GDM: 1984.   DKA: never.   Severe hypoglycemia: once, in 2018.  Pancreatitis: never.  Other: metformin caused dizziness; she takes both trulicity and victoza, and she wants to continue; she took insulin 2012-2018.  she did not tolerate invokana (vaginitis); renal insuff limits rx options.   Interval history:  no cbg record, but states cbg's vary from 71-100's.  She no longer takes the humalog.  No recent steroids.  She often misses the victoza.   Past Medical History:  Diagnosis Date  . Allergy   . Anemia    history of  . Anxiety   . Asthma   . Depression   . Diabetes mellitus   . Fibromyalgia   . Gastric ulcer   . GERD (gastroesophageal reflux disease)   . Grave's disease   . Graves disease   . H/O therapeutic radiation   . Migraine   . Multiple thyroid nodules   . Myofacial muscle pain   . Osteoarthritis    Facet hypertrophy with injections  . TIA (transient ischemic attack)    02/2015    Past Surgical History:  Procedure Laterality Date  . AXILLARY LYMPH NODE DISSECTION  2001  . HYDRADENITIS EXCISION    . TUBAL LIGATION  1995  . VAGINAL HYSTERECTOMY Left 03/11/2017   Procedure: HYSTERECTOMY VAGINAL W/LEFT PROXIMAL SALPINGECTOMY;  Surgeon: Princess Bruins, MD;  Location: [facility];  Service: Gynecology;  Laterality: Left;  request 7:30am OR time  request 2 hours      Social History   Socioeconomic History  . Marital status: Single    Spouse name: Not on file  . Number of children: 4  . Years of education: 75  . Highest education level: Not on file  Occupational History  . Occupation: Research scientist (physical sciences): [employer]  . Financial resource strain: Not on file  . Food insecurity:      Worry: Not on file    Inability: Not on file  . Transportation needs:    Medical: Not on file    Non-medical: Not on file  Tobacco Use  . Smoking status: Former Smoker    Packs/day: 0.50    Years: 30.00    Pack years: 15.00    Types: Cigarettes    Last attempt to quit: 04/24/2018    Years since quitting: 0.1  . Smokeless tobacco: Never Used  Substance and Sexual Activity  . Alcohol use: No    Comment: rare  . Drug use: No    Comment: cocaine use daily for 3 yrs in her 47's. 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  Today denies all drug abuse  . Sexual activity: Not Currently    Birth control/protection: Surgical  Lifestyle  . Physical activity:    Days per week: Not on file    Minutes per session: Not on file  . Stress: Not on file  Relationships  . Social connections:    Talks on phone: Not on file    Gets together: Not on file    Attends religious service: Not on file    Active member of club or organization: Not on file    Attends  meetings of clubs or organizations: Not on file    Relationship status: Not on file  . Intimate partner violence:    Fear of current or ex partner: Not on file    Emotionally abused: Not on file    Physically abused: Not on file    Forced sexual activity: Not on file  Other Topics Concern  . Not on file  Social History Narrative   Lives alone in [place]. Her daugther occassoinally comes and stay with her. 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  originally from [place],  [place].  Raised by mom and her sister who is 8 yrs older than pt. Pt has several half siblings. No longer on disability. Pt has 4 kids, never married.           Current Outpatient Medications on File Prior to Visit  Medication Sig Dispense Refill  . ADVAIR HFA 45-21 MCG/ACT inhaler INHALE 1 INHALATION D  0  . albuterol (PROAIR HFA) 108 (90 Base) MCG/ACT inhaler Inhale 2 puffs into  the lungs every 4 (four) hours as needed for wheezing. 1 Inhaler 2  . albuterol (PROVENTIL) (2.5 MG/3ML) 0.083% nebulizer solution Take 3 mLs by nebulization 4 (four) times daily as needed for wheezing or shortness of breath.   3  . baclofen (LIORESAL) 10 MG tablet TAKE 1 TABLET BY MOUTH THREE TIMES DAILY AS NEEDED FOR MUSCLE SPASMS 270 tablet 0  . benzonatate (TESSALON) 100 MG capsule Take 1 capsule (100 mg total) by mouth 2 (two) times daily as needed for cough. 20 capsule 0  . Blood Glucose Calibration (ONETOUCH VERIO) SOLN Used to check meter accuracy 1 each 0  . Blood Glucose Monitoring Suppl (ONETOUCH VERIO) w/Device KIT     . busPIRone (BUSPAR) 5 MG tablet Take 5 mg by mouth daily.  0  . Dulaglutide (TRULICITY) 1.5 ZO/1.0RU SOPN Inject 1.5 mg into the skin once a week. On Tuesday 2 mL 2  . DULoxetine (CYMBALTA) 30 MG capsule Take 1 capsule (30 mg total) by mouth 2 (two) times daily. 180 capsule 3  . Esomeprazole Magnesium (NEXIUM PO) Take by mouth.    . Galcanezumab-gnlm (EMGALITY) 120 MG/ML SOAJ Inject 1 mL into the skin every 30 (thirty) days. 1 pen 5  . glimepiride (AMARYL) 2 MG tablet Take 1 tablet (2 mg total) by mouth daily before breakfast. 30 tablet 3  . glucose blood (ONETOUCH VERIO) test strip 1 each by Other route 2 (two) times daily. use for testing 150 each 0  . levothyroxine (SYNTHROID, LEVOTHROID) 112 MCG tablet Take 1 tablet (112 mcg total) by mouth daily. (Patient taking differently: Take 112 mcg by mouth daily before breakfast. ) 90 tablet 3  . lidocaine (LIDODERM) 5 % UNWRAP AND APPLY 1 PATCH TO SKIN DAILY FOR 15 DAYS. REMOVE AND DISCARD PATCH WITHIN 12 HOURS OR AS DIRECTED (Patient taking differently: Place 1 patch onto the skin daily as needed (pain). ) 15 patch 0  . Lifitegrast (XIIDRA) 5 % SOLN Place 1 drop into both eyes daily as needed (dry eyes).     Marland Kitchen linaclotide (LINZESS) 145 MCG CAPS capsule Take 290 mcg by mouth daily before breakfast.     . LORazepam (ATIVAN)  0.5 MG tablet Take 1 tablet (0.5 mg total) by mouth every 8 (eight) hours as needed for anxiety. 10 tablet 0  . Menthol (HALLS COUGH DROPS MT) Use as directed 1 drop in the  mouth or throat as needed (cough).    . montelukast (SINGULAIR) 10 MG tablet TK 1 T PO D  3  . ondansetron (ZOFRAN) 4 MG tablet Take 1 tablet (4 mg total) by mouth every 8 (eight) hours as needed for nausea or vomiting. 20 tablet 2  . ONETOUCH DELICA LANCETS 36O MISC USE FOUR TIMES DAILY AS DIRECTED 500 each 0  . pregabalin (LYRICA) 50 MG capsule Take 1 capsule (50 mg total) by mouth 2 (two) times daily as needed (nerve pain). 90 capsule 0  . ranitidine (ZANTAC) 150 MG tablet Take 1 tablet (150 mg total) by mouth 2 (two) times daily. 60 tablet 1  . rizatriptan (MAXALT-MLT) 10 MG disintegrating tablet Take 1 tablet earliest onset of headache.  May repeat once in 2 hours if needed (Patient taking differently: Take 10 mg by mouth See admin instructions. Take 1 tablet earliest onset of headache.  May repeat once in 2 hours if needed) 9 tablet 11  . Topiramate ER (TROKENDI XR) 100 MG CP24 Take 100 mg by mouth daily. 90 capsule 1   No current facility-administered medications on file prior to visit.     Allergies  Allergen Reactions  . Ergotrate [Ergonovine] Other (See Comments)    Pt does not ever want  . Ibuprofen Other (See Comments)    Patient has ulcer  . Metformin And Related Other (See Comments)    Dizziness  . Yutopar [Ritodrine] Other (See Comments)    Pt does not ever want    Family History  Problem Relation Age of Onset  . Hypertension Mother   . Diabetes Mother   . Cancer Mother        rectal  . Heart disease Father 22       MI x5  . Hypertension Father   . Diabetes Father   . Drug abuse Brother   . Alcohol abuse Brother   . Breast cancer Sister        unsure of age , was between 32 and 83  . Drug abuse Brother   . Alcohol abuse Brother   . Anxiety disorder Neg Hx   . Bipolar disorder Neg Hx   .  Depression Neg Hx     BP 136/82 (BP Location: Right Arm, Patient Position: Sitting, Cuff Size: Normal)   Pulse 97   Ht '5\' 4"'  (1.626 m)   Wt 202 lb 3.2 oz (91.7 kg)   LMP 12/06/2016 (Approximate)   SpO2 98%   BMI 34.71 kg/m    Review of Systems Denies LOC    Objective:   Physical Exam VITAL SIGNS:  See vs page GENERAL: no distress Pulses: dorsalis pedis intact bilat.   MSK: no deformity of the feet CV: no leg edema Skin:  no ulcer on the feet.  normal color and temp on the feet.  Neuro: sensation is intact to touch on the feet.    Lab Results  Component Value Date   CREATININE 1.31 (H) 03/06/2018   BUN 24 (H) 03/06/2018   NA 136 03/06/2018   K 5.1 03/06/2018   CL 94 (L) 03/06/2018   CO2 29 03/06/2018   Lab Results  Component Value Date   HGBA1C 8.2 (A) 06/01/2018       Assessment & Plan:  Type 2 DM: she needs increased rx Renal insuff: this limits glimepiride dosage Missing victoza: we discussed.  She'll resume.  Patient Instructions  Please resume the victoza, and: Please continue the same other diabetes medications.  check your blood sugar once a day.  vary the time of day when you check, between before the 3 meals, and at bedtime.  also check if you have symptoms of your blood sugar being too high or too low.  please keep a record of the readings and bring it to your next appointment here (or you can bring the meter itself).  You can write it on any piece of paper.  please call us sooner if your blood sugar goes below 70, or if you have a lot of readings over 200.  If your blood sugar goes low, we'll reduce the glimepiride.   Please come back for a follow-up appointment in 2 months.

## 2018-06-01 NOTE — Patient Instructions (Addendum)
Please resume the victoza, and: Please continue the same other diabetes medications.   check your blood sugar once a day.  vary the time of day when you check, between before the 3 meals, and at bedtime.  also check if you have symptoms of your blood sugar being too high or too low.  please keep a record of the readings and bring it to your next appointment here (or you can bring the meter itself).  You can write it on any piece of paper.  please call us sooner if your blood sugar goes below 70, or if you have a lot of readings over 200.  If your blood sugar goes low, we'll reduce the glimepiride.   Please come back for a follow-up appointment in 2 months.

## 2018-06-15 NOTE — Progress Notes (Signed)
Received notice from [employer] that pt's Emgality has been   Approved thru 06/16/2019 Reference # [id number]

## 2018-06-15 NOTE — Progress Notes (Signed)
Prior Authorization initiated via [email or web address] for pt's   Emgality 120mg /mL

## 2018-06-22 ENCOUNTER — Other Ambulatory Visit: Payer: Self-pay | Admitting: Nurse Practitioner

## 2018-06-22 DIAGNOSIS — M542 Cervicalgia: Secondary | ICD-10-CM

## 2018-06-23 ENCOUNTER — Ambulatory Visit
Admission: RE | Admit: 2018-06-23 | Discharge: 2018-06-23 | Disposition: A | Discharge status: Home / Self Care | Payer: 59 | Source: Ambulatory Visit | Attending: Nurse Practitioner | Admitting: Nurse Practitioner

## 2018-06-23 DIAGNOSIS — M542 Cervicalgia: Secondary | ICD-10-CM

## 2018-07-15 ENCOUNTER — Other Ambulatory Visit: Payer: Self-pay | Admitting: Endocrinology

## 2018-07-17 ENCOUNTER — Other Ambulatory Visit: Payer: Self-pay | Admitting: Nurse Practitioner

## 2018-07-17 ENCOUNTER — Telehealth: Payer: Self-pay | Admitting: Neurology

## 2018-07-17 NOTE — Telephone Encounter (Signed)
Patient states that she just saw her PCP and they want her to see DR JAFFE ASAP. Her muscles are contracting and moving and they are concerned about that please call. She has appt in March

## 2018-07-17 NOTE — Telephone Encounter (Signed)
Called and advised Pt per Dr Tomi Likens, it is more than likely related to fibromyalgia and he does not treat her fibromyalgia. He feels the March appt is appropriate unless the PCP would like to contact him

## 2018-07-23 ENCOUNTER — Other Ambulatory Visit: Payer: Self-pay | Admitting: Family Medicine

## 2018-07-23 DIAGNOSIS — M79602 Pain in left arm: Secondary | ICD-10-CM

## 2018-08-04 ENCOUNTER — Other Ambulatory Visit: Payer: Self-pay | Admitting: Endocrinology

## 2018-08-05 ENCOUNTER — Encounter: Payer: Self-pay | Admitting: Endocrinology

## 2018-08-05 ENCOUNTER — Ambulatory Visit (INDEPENDENT_AMBULATORY_CARE_PROVIDER_SITE_OTHER): Payer: 59 | Admitting: Endocrinology

## 2018-08-05 VITALS — BP 152/84 | HR 92 | Ht 64.0 in | Wt 207.8 lb

## 2018-08-05 DIAGNOSIS — E119 Type 2 diabetes mellitus without complications: Secondary | ICD-10-CM | POA: Diagnosis not present

## 2018-08-05 LAB — POCT GLYCOSYLATED HEMOGLOBIN (HGB A1C): Hemoglobin A1C: 9.1 % — AB (ref 4.0–5.6)

## 2018-08-05 NOTE — Patient Instructions (Addendum)
Your blood pressure is high today.  Please see your primary care provider soon, to have it rechecked Please continue the same diabetes medications.   check your blood sugar once a day.  vary the time of day when you check, between before the 3 meals, and at bedtime.  also check if you have symptoms of your blood sugar being too high or too low.  please keep a record of the readings and bring it to your next appointment here (or you can bring the meter itself).  You can write it on any piece of paper.  please call us sooner if your blood sugar goes below 70, or if you have a lot of readings over 200.   Please come back for a follow-up appointment in 2 months.

## 2018-08-05 NOTE — Progress Notes (Signed)
Subjective:    Patient ID: Kendra Hall, female    DOB: 06/09/1966, 53 y.o.   MRN: [id number]  HPI Pt returns for f/u of diabetes mellitus:  DM type: 2.  Dx'ed: 9628 Complications: renal insuff.    Therapy: trulicity, victoza, and glimepiride.  GDM: 1984.   DKA: never.   Severe hypoglycemia: once, in 2018.  Pancreatitis: never.  Other: metformin caused dizziness; she takes both trulicity and victoza, and she wants to continue; she took insulin 2012-2018.  she did not tolerate invokana (vaginitis); renal insuff limits rx options.   Interval history:  no cbg record, but states cbg's vary from 75-200's. No recent steroids.  She still sometimes misses the victoza.   Past Medical History:  Diagnosis Date  . Allergy   . Anemia    history of  . Anxiety   . Asthma   . Depression   . Diabetes mellitus   . Fibromyalgia   . Gastric ulcer   . GERD (gastroesophageal reflux disease)   . Grave's disease   . Graves disease   . H/O therapeutic radiation   . Migraine   . Multiple thyroid nodules   . Myofacial muscle pain   . Osteoarthritis    Facet hypertrophy with injections  . TIA (transient ischemic attack)    02/2015    Past Surgical History:  Procedure Laterality Date  . AXILLARY LYMPH NODE DISSECTION  2001  . HYDRADENITIS EXCISION    . TUBAL LIGATION  1995  . VAGINAL HYSTERECTOMY Left 03/11/2017   Procedure: HYSTERECTOMY VAGINAL W/LEFT PROXIMAL SALPINGECTOMY;  Surgeon: Princess Bruins, MD;  Location: [facility];  Service: Gynecology;  Laterality: Left;  request 7:30am OR time  request 2 hours      Social History   Socioeconomic History  . Marital status: Single    Spouse name: Not on file  . Number of children: 4  . Years of education: 48  . Highest education level: Not on file  Occupational History  . Occupation: Research scientist (physical sciences): [employer]  . Financial resource strain: Not on file  . Food insecurity:    Worry: Not on file   Inability: Not on file  . Transportation needs:    Medical: Not on file    Non-medical: Not on file  Tobacco Use  . Smoking status: Former Smoker    Packs/day: 0.50    Years: 30.00    Pack years: 15.00    Types: Cigarettes    Last attempt to quit: 04/24/2018    Years since quitting: 0.2  . Smokeless tobacco: Never Used  Substance and Sexual Activity  . Alcohol use: No    Comment: rare  . 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  Today denies all drug abuse  . Sexual activity: Not Currently    Birth control/protection: Surgical  Lifestyle  . Physical activity:    Days per week: Not on file    Minutes per session: Not on file  . Stress: Not on file  Relationships  . Social connections:    Talks on phone: Not on file    Gets together: Not on file    Attends religious service: Not on file    Active member of club or organization: Not on file    Attends meetings of clubs or organizations: Not on file  Relationship status: Not on file  . Intimate partner violence:    Fear of current or ex partner: Not on file    Emotionally abused: Not on file    Physically abused: Not on file    Forced sexual activity: Not on file  Other Topics Concern  . Not on file  Social History Narrative   Lives alone in [place]. Her daugther occassoinally comes and stay with her. 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  originally from [place],  [place].  Raised by mom and her sister who is 8 yrs older than pt. Pt has several half siblings. No longer on disability. Pt has 4 kids, never married.           Current Outpatient Medications on File Prior to Visit  Medication Sig Dispense Refill  . ADVAIR HFA 45-21 MCG/ACT inhaler INHALE 1 INHALATION D  0  . albuterol (PROAIR HFA) 108 (90 Base) MCG/ACT inhaler Inhale 2 puffs into the lungs every 4 (four)  hours as needed for wheezing. 1 Inhaler 2  . albuterol (PROVENTIL) (2.5 MG/3ML) 0.083% nebulizer solution Take 3 mLs by nebulization 4 (four) times daily as needed for wheezing or shortness of breath.   3  . baclofen (LIORESAL) 10 MG tablet TAKE 1 TABLET BY MOUTH THREE TIMES DAILY AS NEEDED FOR MUSCLE SPASMS 270 tablet 0  . benzonatate (TESSALON) 100 MG capsule Take 1 capsule (100 mg total) by mouth 2 (two) times daily as needed for cough. 20 capsule 0  . Blood Glucose Calibration (ONETOUCH VERIO) SOLN Used to check meter accuracy 1 each 0  . Blood Glucose Monitoring Suppl (ONETOUCH VERIO) w/Device KIT     . DULoxetine (CYMBALTA) 30 MG capsule Take 1 capsule (30 mg total) by mouth 2 (two) times daily. 180 capsule 3  . Esomeprazole Magnesium (NEXIUM PO) Take by mouth.    . Galcanezumab-gnlm (EMGALITY) 120 MG/ML SOAJ Inject 1 mL into the skin every 30 (thirty) days. 1 pen 5  . glimepiride (AMARYL) 2 MG tablet TAKE 1 TABLET(2 MG) BY MOUTH DAILY BEFORE BREAKFAST 30 tablet 3  . glucose blood (ONETOUCH VERIO) test strip 1 each by Other route 2 (two) times daily. use for testing 150 each 0  . levothyroxine (SYNTHROID, LEVOTHROID) 112 MCG tablet Take 1 tablet (112 mcg total) by mouth daily. (Patient taking differently: Take 125 mcg by mouth daily before breakfast. ) 90 tablet 3  . lidocaine (LIDODERM) 5 % UNWRAP AND APPLY 1 PATCH TO SKIN DAILY FOR 15 DAYS. REMOVE AND DISCARD PATCH WITHIN 12 HOURS OR AS DIRECTED (Patient taking differently: Place 1 patch onto the skin daily as needed (pain). ) 15 patch 0  . Lifitegrast (XIIDRA) 5 % SOLN Place 1 drop into both eyes daily as needed (dry eyes).     Marland Kitchen linaclotide (LINZESS) 145 MCG CAPS capsule Take 290 mcg by mouth daily before breakfast.     . liraglutide (VICTOZA) 18 MG/3ML SOPN Inject 0.2 mLs (1.2 mg total) into the skin daily. 3 pen 11  . Menthol (HALLS COUGH DROPS MT) Use as directed 1 drop in the mouth or throat as needed (cough).    . montelukast  (SINGULAIR) 10 MG tablet TK 1 T PO D  3  . omeprazole (PRILOSEC) 40 MG capsule Take 40 mg by mouth daily.    . ondansetron (ZOFRAN) 4 MG tablet Take 1 tablet (4 mg total) by mouth every 8 (eight)  hours as needed for nausea or vomiting. 20 tablet 2  . ONETOUCH DELICA LANCETS 30Q MISC USE FOUR TIMES DAILY AS DIRECTED 500 each 0  . pregabalin (LYRICA) 50 MG capsule Take 1 capsule (50 mg total) by mouth 2 (two) times daily as needed (nerve pain). 90 capsule 0  . rizatriptan (MAXALT-MLT) 10 MG disintegrating tablet Take 1 tablet earliest onset of headache.  May repeat once in 2 hours if needed (Patient taking differently: Take 10 mg by mouth See admin instructions. Take 1 tablet earliest onset of headache.  May repeat once in 2 hours if needed) 9 tablet 11  . Topiramate ER (TROKENDI XR) 100 MG CP24 Take 100 mg by mouth daily. 90 capsule 1  . TRULICITY 1.5 MV/7.8IO SOPN INJECT 1.5MG UNDER THE SKIN ONCE EVERY WEEK ON TUESDAY 2 mL 2   No current facility-administered medications on file prior to visit.     Allergies  Allergen Reactions  . Ergotrate [Ergonovine] Other (See Comments)    Pt does not ever want  . Ibuprofen Other (See Comments)    Patient has ulcer  . Metformin And Related Other (See Comments)    Dizziness  . Yutopar [Ritodrine] Other (See Comments)    Pt does not ever want    Family History  Problem Relation Age of Onset  . Hypertension Mother   . Diabetes Mother   . Cancer Mother        rectal  . Heart disease Father 32       MI x5  . Hypertension Father   . Diabetes Father   . Drug abuse Brother   . Alcohol abuse Brother   . Breast cancer Sister        unsure of age , was between 45 and 83  . Drug abuse Brother   . Alcohol abuse Brother   . Anxiety disorder Neg Hx   . Bipolar disorder Neg Hx   . Depression Neg Hx     BP (!) 152/84 (BP Location: Right Arm, Patient Position: Sitting, Cuff Size: Large)   Pulse 92   Ht _0  (1.626 m)   Wt 207 lb 12.8 oz (94.3 kg)    LMP 12/06/2016 (Approximate)   SpO2 94%   BMI 35.67 kg/m    Review of Systems She denies hypoglycemia.      Objective:   Physical Exam VITAL SIGNS:  See vs page GENERAL: no distress Pulses: dorsalis pedis intact bilat.   MSK: no deformity of the feet CV: no leg edema Skin:  no ulcer on the feet.  normal color and temp on the feet. Neuro: sensation is intact to touch on the feet  Lab Results  Component Value Date   CREATININE 1.31 (H) 03/06/2018   BUN 24 (H) 03/06/2018   NA 136 03/06/2018   K 5.1 03/06/2018   CL 94 (L) 03/06/2018   CO2 29 03/06/2018    A1c=9.1%     Assessment & Plan:  Type 2 DM: worse.  We discussed.  She declines to resume insulin. Renal failure: this limits rx options.  HTN: is noted today  Patient Instructions  Your blood pressure is high today.  Please see your primary care provider soon, to have it rechecked Please continue the same diabetes medications.   check your blood sugar once a day.  vary the time of day when you check, between before the 3 meals, and at bedtime.  also check if you have symptoms of your blood sugar being  too high or too low.  please keep a record of the readings and bring it to your next appointment here (or you can bring the meter itself).  You can write it on any piece of paper.  please call us sooner if your blood sugar goes below 70, or if you have a lot of readings over 200.   Please come back for a follow-up appointment in 2 months.

## 2018-08-19 ENCOUNTER — Other Ambulatory Visit: Payer: Self-pay | Admitting: Surgery

## 2018-08-19 DIAGNOSIS — M79621 Pain in right upper arm: Secondary | ICD-10-CM

## 2018-08-23 ENCOUNTER — Other Ambulatory Visit: Payer: Self-pay | Admitting: Endocrinology

## 2018-08-26 ENCOUNTER — Ambulatory Visit
Admission: RE | Admit: 2018-08-26 | Discharge: 2018-08-26 | Disposition: A | Discharge status: Home / Self Care | Payer: 59 | Source: Ambulatory Visit | Attending: Surgery | Admitting: Surgery

## 2018-08-26 DIAGNOSIS — M79621 Pain in right upper arm: Secondary | ICD-10-CM

## 2018-09-01 ENCOUNTER — Other Ambulatory Visit: Payer: Self-pay | Admitting: Surgery

## 2018-09-02 ENCOUNTER — Other Ambulatory Visit: Payer: Self-pay

## 2018-09-02 ENCOUNTER — Encounter (HOSPITAL_COMMUNITY): Payer: Self-pay | Admitting: *Deleted

## 2018-09-02 NOTE — H&P (Signed)
Kendra Hall Documented: 09/01/2018 3:53 PM Location: [facility] Patient #: [id number] DOB: [birth year] Single / Language: Cleophus Molt / Race: Black or African American Female   History of Present Illness (Carole Deere A. Ninfa Linden MD; 09/01/2018 4:11 PM) The patient is a 53 year old female who presents for evaluation of gall stones. This patient is known to our office. She is actually in the workup of axillary pain when she developed symptomatic cholelithiasis. She has had an attack of biliary colic which is significant with significant epigastric and right upper quadrant abdominal discomfort. She describes the pain as sharp and moderate to severe. She had no sounds showing a 2 cm gallstone with multiple other stones. The bile ducts were normal in appearance. She's been having attacks of abdominal bloating and nausea after fatty meals for some time. This is the first severe attack she has had.   Past Surgical History Emeline Gins, [place]; 09/01/2018 3:53 PM) Colon Polyp Removal - Colonoscopy  Hysterectomy (not due to cancer) - Partial   Diagnostic Studies History Emeline Gins, [place]; 09/01/2018 3:53 PM) Colonoscopy  5-10 years ago Mammogram  within last year Pap Smear  1-5 years ago  Allergies Emeline Gins, CMA; 09/01/2018 3:53 PM) Janey Greaser Maleate *OXYTOCICS*  Ibuprofen *ANALGESICS - ANTI-INFLAMMATORY*  metFORMIN HCl *CHEMICALS*  Yutopar *ENDOCRINE AND METABOLIC AGENTS - MISC.*  Allergies Reconciled   Medication History Emeline Gins, CMA; 09/01/2018 3:54 PM) Advair HFA (45-21MCG/ACT Aerosol, Inhalation) Active. Albuterol Sulfate ((2.5 MG/3ML)0.083% Nebulized Soln, Inhalation) Active. Baclofen (10MG  Tablet, Oral) Active. Benzonatate (100MG  Capsule, Oral) Active. DULoxetine HCl (30MG  Capsule DR Part, Oral) Active. NexIUM (5MG  Packet, Oral) Active. Emgality (120MG /ML Soln Auto-inj, Subcutaneous) Active. Glimepiride (2MG  Tablet, Oral)  Active. Levothyroxine Sodium (125MCG Tablet, Oral) Active. Lidocaine (5% Cream, External) Active. Xiidra (5% Solution, Ophthalmic) Active. Linzess (290MCG Capsule, Oral) Active. Victoza (18MG /3ML Soln Pen-inj, Subcutaneous) Active. Omeprazole (40MG  Capsule DR, Oral) Active. Zofran (4MG  Tablet, Oral) Active. Lyrica (50MG  Capsule, Oral) Active. Rizatriptan Benzoate (10MG  (Dis) Tablet, Oral) Active. Topiramate (100MG  Tablet, Oral) Active. Trulicity (1.5MG /0.5ML Soln Pen-inj, Subcutaneous) Active. Medications Reconciled  Social History Emeline Gins, [place]; 09/01/2018 3:53 PM) Alcohol use  Occasional alcohol use. Caffeine use  Carbonated beverages, Tea. Tobacco use  Former smoker.  Family History Emeline Gins, [place]; 09/01/2018 3:53 PM) Breast Cancer  Sister. Diabetes Mellitus  Father, Mother. Heart Disease  Father. Heart disease in female family member before age 84  Hypertension  Father, Mother. Kidney Disease  Father. Migraine Headache  Sister. Rectal Cancer  Mother. Respiratory Condition  Brother, Son. Thyroid problems  Sister.  Pregnancy / Birth History Emeline Gins, [place]; 09/01/2018 3:53 PM) Age at menarche  7 years. Gravida  6 Length (months) of breastfeeding  7-12 Maternal age  38-20 Para  90  Other Problems Emeline Gins, [place]; 09/01/2018 3:53 PM) Anxiety Disorder  Arthritis  Asthma  Back Pain  Cerebrovascular Accident  Depression  Diabetes Mellitus  Gastric Ulcer  Gastroesophageal Reflux Disease  Migraine Headache  Thyroid Disease     Review of Systems Emeline Gins CMA; 09/01/2018 3:53 PM) General Present- Fatigue and Weight Gain. Not Present- Appetite Loss, Chills, Fever, Night Sweats and Weight Loss. Skin Present- Hives. Not Present- Change in Wart/Mole, Dryness, Jaundice, New Lesions, Non-Healing Wounds, Rash and Ulcer. HEENT Present- Seasonal Allergies, Visual Disturbances and Wears glasses/contact lenses.  Not Present- Earache, Hearing Loss, Hoarseness, Nose Bleed, Oral Ulcers, Ringing in the Ears, Sinus Pain, Sore Throat and Yellow Eyes. Cardiovascular Not Present- Chest Pain, Difficulty Breathing Lying Down, Leg Cramps, Palpitations,  Rapid Heart Rate, Shortness of Breath and Swelling of Extremities. Gastrointestinal Present- Abdominal Pain and Nausea. Not Present- Bloating, Bloody Stool, Change in Bowel Habits, Chronic diarrhea, Constipation, Difficulty Swallowing, Excessive gas, Gets full quickly at meals, Hemorrhoids, Indigestion, Rectal Pain and Vomiting. Female Genitourinary Not Present- Frequency, Nocturia, Painful Urination, Pelvic Pain and Urgency. Neurological Present- Decreased Memory, Headaches, Numbness and Tingling. Not Present- Fainting, Seizures, Tremor, Trouble walking and Weakness. Psychiatric Present- Anxiety and Depression. Not Present- Bipolar, Change in Sleep Pattern, Fearful and Frequent crying. Endocrine Present- Heat Intolerance. Not Present- Cold Intolerance, Excessive Hunger, Hair Changes, Hot flashes and New Diabetes.  Vitals Emeline Gins CMA; 09/01/2018 3:53 PM) 09/01/2018 3:53 PM Weight: 206.8 lb Height: 64in Body Surface Area: 1.98 m Body Mass Index: 35.5 kg/m  Temp.: 97.52F  Pulse: 103 (Regular)  BP: 154/94 (Sitting, Left Arm, Standard)       Physical Exam (Damary Doland A. Ninfa Linden MD; 09/01/2018 4:12 PM) The physical exam findings are as follows: Note:On exam, her abdomen is soft but moderately tender with guarding in the epigastrium and right upper quadrant. There are no other abnormalities. Lungs clear CV RRR Ext normal Psych normal Skin without rashes    Assessment & Plan (Qianna Clagett A. Ninfa Linden MD; 09/01/2018 4:14 PM) SYMPTOMATIC CHOLELITHIASIS (K80.20) Impression: I believe she has some cholecystitis based on her physical examination. An urgent cholecystectomy is recommended. I suspect the stone may be in the neck of the gallbladder. I gave  her literature regarding cholecystectomy we discussed the surgery in detail. I discussed the risk which includes but is not limited to bleeding, infection, injury to surrounding structures, the need to convert to an open procedure, cardiopulmonary she's, DVT, the testis may not resolve all her symptoms, etc. Regarding her axilla and her chronic hidradenitis, I will go ahead and start her on clindamycin both oral and topical. Current Plans

## 2018-09-03 ENCOUNTER — Encounter (HOSPITAL_COMMUNITY)
Admission: RE | Disposition: A | Discharge status: Home / Self Care | Payer: Self-pay | Source: Home / Self Care | Attending: Surgery

## 2018-09-03 ENCOUNTER — Ambulatory Visit (HOSPITAL_COMMUNITY)
Admission: RE | Admit: 2018-09-03 | Discharge: 2018-09-03 | Disposition: A | Discharge status: Home / Self Care | Payer: 59 | Attending: Surgery | Admitting: Surgery

## 2018-09-03 ENCOUNTER — Ambulatory Visit (HOSPITAL_COMMUNITY): Payer: 59 | Admitting: Certified Registered"

## 2018-09-03 ENCOUNTER — Encounter (HOSPITAL_COMMUNITY): Payer: Self-pay | Admitting: *Deleted

## 2018-09-03 DIAGNOSIS — Z7951 Long term (current) use of inhaled steroids: Secondary | ICD-10-CM | POA: Diagnosis not present

## 2018-09-03 DIAGNOSIS — J45909 Unspecified asthma, uncomplicated: Secondary | ICD-10-CM | POA: Insufficient documentation

## 2018-09-03 DIAGNOSIS — Z8673 Personal history of transient ischemic attack (TIA), and cerebral infarction without residual deficits: Secondary | ICD-10-CM | POA: Insufficient documentation

## 2018-09-03 DIAGNOSIS — F329 Major depressive disorder, single episode, unspecified: Secondary | ICD-10-CM | POA: Diagnosis not present

## 2018-09-03 DIAGNOSIS — F419 Anxiety disorder, unspecified: Secondary | ICD-10-CM | POA: Insufficient documentation

## 2018-09-03 DIAGNOSIS — G43909 Migraine, unspecified, not intractable, without status migrainosus: Secondary | ICD-10-CM | POA: Diagnosis not present

## 2018-09-03 DIAGNOSIS — Z888 Allergy status to other drugs, medicaments and biological substances status: Secondary | ICD-10-CM | POA: Insufficient documentation

## 2018-09-03 DIAGNOSIS — Z79890 Hormone replacement therapy (postmenopausal): Secondary | ICD-10-CM | POA: Diagnosis not present

## 2018-09-03 DIAGNOSIS — L732 Hidradenitis suppurativa: Secondary | ICD-10-CM | POA: Diagnosis not present

## 2018-09-03 DIAGNOSIS — Z7984 Long term (current) use of oral hypoglycemic drugs: Secondary | ICD-10-CM | POA: Diagnosis not present

## 2018-09-03 DIAGNOSIS — E119 Type 2 diabetes mellitus without complications: Secondary | ICD-10-CM | POA: Insufficient documentation

## 2018-09-03 DIAGNOSIS — K8010 Calculus of gallbladder with chronic cholecystitis without obstruction: Secondary | ICD-10-CM | POA: Diagnosis not present

## 2018-09-03 DIAGNOSIS — M1990 Unspecified osteoarthritis, unspecified site: Secondary | ICD-10-CM | POA: Diagnosis not present

## 2018-09-03 DIAGNOSIS — Z79899 Other long term (current) drug therapy: Secondary | ICD-10-CM | POA: Diagnosis not present

## 2018-09-03 DIAGNOSIS — E039 Hypothyroidism, unspecified: Secondary | ICD-10-CM | POA: Insufficient documentation

## 2018-09-03 DIAGNOSIS — I10 Essential (primary) hypertension: Secondary | ICD-10-CM | POA: Diagnosis not present

## 2018-09-03 DIAGNOSIS — Z87891 Personal history of nicotine dependence: Secondary | ICD-10-CM | POA: Insufficient documentation

## 2018-09-03 DIAGNOSIS — K219 Gastro-esophageal reflux disease without esophagitis: Secondary | ICD-10-CM | POA: Diagnosis not present

## 2018-09-03 DIAGNOSIS — K8020 Calculus of gallbladder without cholecystitis without obstruction: Secondary | ICD-10-CM | POA: Diagnosis present

## 2018-09-03 HISTORY — PX: CHOLECYSTECTOMY: SHX55

## 2018-09-03 LAB — BASIC METABOLIC PANEL
Anion gap: 11 (ref 5–15)
BUN: 14 mg/dL (ref 6–20)
CO2: 26 mmol/L (ref 22–32)
Calcium: 8.8 mg/dL — ABNORMAL LOW (ref 8.9–10.3)
Chloride: 99 mmol/L (ref 98–111)
Creatinine, Ser: 0.91 mg/dL (ref 0.44–1.00)
GFR calc Af Amer: >60 mL/min (ref >60)
GFR calc non Af Amer: >60 mL/min (ref >60)
Glucose, Bld: 211 mg/dL — ABNORMAL HIGH (ref 70–99)
Potassium: 4 mmol/L (ref 3.5–5.1)
Sodium: 136 mmol/L (ref 135–145)

## 2018-09-03 LAB — CBC
HCT: 45 % (ref 36.0–46.0)
Hemoglobin: 14.1 g/dL (ref 12.0–15.0)
MCH: 27.1 pg (ref 26.0–34.0)
MCHC: 31.3 g/dL (ref 30.0–36.0)
MCV: 86.5 fL (ref 80.0–100.0)
Platelets: 272 10*3/uL (ref 150–400)
RBC: 5.2 MIL/uL — ABNORMAL HIGH (ref 3.87–5.11)
RDW: 13.1 % (ref 11.5–15.5)
WBC: 5.8 10*3/uL (ref 4.0–10.5)
nRBC: 0 % (ref 0.0–0.2)

## 2018-09-03 LAB — GLUCOSE, CAPILLARY
Glucose-Capillary: 213 mg/dL — ABNORMAL HIGH (ref 70–99)
Glucose-Capillary: 208 mg/dL — ABNORMAL HIGH (ref 70–99)

## 2018-09-03 SURGERY — LAPAROSCOPIC CHOLECYSTECTOMY WITH INTRAOPERATIVE CHOLANGIOGRAM
Anesthesia: General | Site: Abdomen

## 2018-09-03 MED ORDER — FENTANYL CITRATE (PF) 250 MCG/5ML IJ SOLN
INTRAMUSCULAR | Status: AC
  Filled 2018-09-03: qty 5

## 2018-09-03 MED ORDER — LIDOCAINE 2% (20 MG/ML) 5 ML SYRINGE
INTRAMUSCULAR | Status: DC | PRN
  Administered 2018-09-03: 80 mg via INTRAVENOUS

## 2018-09-03 MED ORDER — MIDAZOLAM HCL 2 MG/2ML IJ SOLN
INTRAMUSCULAR | Status: DC | PRN
  Administered 2018-09-03: 2 mg via INTRAVENOUS

## 2018-09-03 MED ORDER — HYDROMORPHONE HCL 1 MG/ML IJ SOLN
0.2500 mg | INTRAMUSCULAR | Status: DC | PRN
  Administered 2018-09-03 (×2): 0.5 mg via INTRAVENOUS

## 2018-09-03 MED ORDER — SUGAMMADEX SODIUM 500 MG/5ML IV SOLN
INTRAVENOUS | Status: AC
  Filled 2018-09-03: qty 5

## 2018-09-03 MED ORDER — PROMETHAZINE HCL 25 MG/ML IJ SOLN
INTRAMUSCULAR | Status: AC
  Filled 2018-09-03: qty 1

## 2018-09-03 MED ORDER — BUPIVACAINE HCL (PF) 0.5 % IJ SOLN
INTRAMUSCULAR | Status: AC
  Filled 2018-09-03: qty 30

## 2018-09-03 MED ORDER — ONDANSETRON HCL 4 MG/2ML IJ SOLN
INTRAMUSCULAR | Status: DC | PRN
  Administered 2018-09-03: 4 mg via INTRAVENOUS

## 2018-09-03 MED ORDER — CEFAZOLIN SODIUM-DEXTROSE 2-4 GM/100ML-% IV SOLN
2.0000 g | INTRAVENOUS | Status: AC
  Administered 2018-09-03: 2 g via INTRAVENOUS
  Filled 2018-09-03: qty 100

## 2018-09-03 MED ORDER — BUPIVACAINE HCL (PF) 0.5 % IJ SOLN
INTRAMUSCULAR | Status: DC | PRN
  Administered 2018-09-03: 20 mL

## 2018-09-03 MED ORDER — OXYCODONE HCL 5 MG/5ML PO SOLN: 5.0000 mg | Freq: Once | ORAL | Status: AC | PRN

## 2018-09-03 MED ORDER — CHLORHEXIDINE GLUCONATE CLOTH 2 % EX PADS: 6.0000 | MEDICATED_PAD | Freq: Once | CUTANEOUS | Status: DC

## 2018-09-03 MED ORDER — SUGAMMADEX SODIUM 500 MG/5ML IV SOLN
INTRAVENOUS | Status: DC | PRN
  Administered 2018-09-03: 350 mg via INTRAVENOUS

## 2018-09-03 MED ORDER — ACETAMINOPHEN 500 MG PO TABS
1000.0000 mg | ORAL_TABLET | ORAL | Status: AC
  Administered 2018-09-03: 1000 mg via ORAL
  Filled 2018-09-03: qty 2

## 2018-09-03 MED ORDER — PROPOFOL 10 MG/ML IV BOLUS
INTRAVENOUS | Status: DC | PRN
  Administered 2018-09-03: 200 mg via INTRAVENOUS

## 2018-09-03 MED ORDER — ACETAMINOPHEN 10 MG/ML IV SOLN: 1000.0000 mg | Freq: Once | INTRAVENOUS | Status: DC | PRN

## 2018-09-03 MED ORDER — DEXAMETHASONE SODIUM PHOSPHATE 10 MG/ML IJ SOLN
INTRAMUSCULAR | Status: DC | PRN
  Administered 2018-09-03: 10 mg via INTRAVENOUS

## 2018-09-03 MED ORDER — LACTATED RINGERS IR SOLN
Status: DC | PRN
  Administered 2018-09-03: 1000 mL

## 2018-09-03 MED ORDER — ROCURONIUM BROMIDE 10 MG/ML (PF) SYRINGE
PREFILLED_SYRINGE | INTRAVENOUS | Status: DC | PRN
  Administered 2018-09-03: 50 mg via INTRAVENOUS

## 2018-09-03 MED ORDER — MIDAZOLAM HCL 2 MG/2ML IJ SOLN
INTRAMUSCULAR | Status: AC
  Filled 2018-09-03: qty 2

## 2018-09-03 MED ORDER — PROMETHAZINE HCL 25 MG/ML IJ SOLN: 6.2500 mg | INTRAMUSCULAR | Status: DC | PRN

## 2018-09-03 MED ORDER — 0.9 % SODIUM CHLORIDE (POUR BTL) OPTIME
TOPICAL | Status: DC | PRN
  Administered 2018-09-03: 1000 mL

## 2018-09-03 MED ORDER — OXYCODONE HCL 5 MG PO TABS
ORAL_TABLET | ORAL | Status: AC
  Filled 2018-09-03: qty 1

## 2018-09-03 MED ORDER — GABAPENTIN 300 MG PO CAPS
300.0000 mg | ORAL_CAPSULE | ORAL | Status: AC
  Administered 2018-09-03: 300 mg via ORAL
  Filled 2018-09-03: qty 1

## 2018-09-03 MED ORDER — OXYCODONE HCL 5 MG PO TABS: 5.0000 mg | ORAL_TABLET | Freq: Four times a day (QID) | ORAL | 0 refills | Status: DC | PRN

## 2018-09-03 MED ORDER — PHENYLEPHRINE 40 MCG/ML (10ML) SYRINGE FOR IV PUSH (FOR BLOOD PRESSURE SUPPORT)
PREFILLED_SYRINGE | INTRAVENOUS | Status: DC | PRN
  Administered 2018-09-03: 80 ug via INTRAVENOUS

## 2018-09-03 MED ORDER — FENTANYL CITRATE (PF) 250 MCG/5ML IJ SOLN
INTRAMUSCULAR | Status: DC | PRN
  Administered 2018-09-03: 100 ug via INTRAVENOUS

## 2018-09-03 MED ORDER — PROPOFOL 10 MG/ML IV BOLUS
INTRAVENOUS | Status: AC
  Filled 2018-09-03: qty 20

## 2018-09-03 MED ORDER — LACTATED RINGERS IV SOLN
INTRAVENOUS | Status: DC
  Administered 2018-09-03: 12:00:00 via INTRAVENOUS

## 2018-09-03 MED ORDER — OXYCODONE HCL 5 MG PO TABS
5.0000 mg | ORAL_TABLET | Freq: Once | ORAL | Status: AC | PRN
  Administered 2018-09-03: 5 mg via ORAL

## 2018-09-03 MED ORDER — HYDROMORPHONE HCL 1 MG/ML IJ SOLN
INTRAMUSCULAR | Status: AC
  Filled 2018-09-03: qty 1

## 2018-09-03 SURGICAL SUPPLY — 36 items
ADH SKN CLS APL DERMABOND .7 (GAUZE/BANDAGES/DRESSINGS) ×1
APPLIER CLIP 5 13 M/L LIGAMAX5 (MISCELLANEOUS) ×2
APR CLP MED LRG 5 ANG JAW (MISCELLANEOUS) ×1
BAG SPEC RTRVL LRG 6X4 10 (ENDOMECHANICALS) ×1
CABLE HIGH FREQUENCY MONO STRZ (ELECTRODE) ×2 IMPLANT
CHLORAPREP W/TINT 26ML (MISCELLANEOUS) ×2 IMPLANT
CLIP APPLIE 5 13 M/L LIGAMAX5 (MISCELLANEOUS) ×1 IMPLANT
COVER MAYO STAND STRL (DRAPES) IMPLANT
COVER WAND RF STERILE (DRAPES) IMPLANT
DECANTER SPIKE VIAL GLASS SM (MISCELLANEOUS) ×2 IMPLANT
DERMABOND ADVANCED (GAUZE/BANDAGES/DRESSINGS) ×1
DERMABOND ADVANCED .7 DNX12 (GAUZE/BANDAGES/DRESSINGS) ×1 IMPLANT
DRAPE C-ARM 42X120 X-RAY (DRAPES) IMPLANT
ELECT REM PT RETURN 15FT ADLT (MISCELLANEOUS) ×2 IMPLANT
GLOVE BIOGEL PI IND STRL 7.0 (GLOVE) IMPLANT
GLOVE BIOGEL PI IND STRL 7.5 (GLOVE) IMPLANT
GLOVE BIOGEL PI INDICATOR 7.0 (GLOVE) ×4
GLOVE BIOGEL PI INDICATOR 7.5 (GLOVE) ×1
GLOVE SURG SIGNA 7.5 PF LTX (GLOVE) ×1 IMPLANT
GLOVE SURG SS PI 7.0 STRL IVOR (GLOVE) ×1 IMPLANT
GOWN STRL REUS W/TWL XL LVL3 (GOWN DISPOSABLE) ×5 IMPLANT
HEMOSTAT SURGICEL 4X8 (HEMOSTASIS) IMPLANT
KIT BASIN OR (CUSTOM PROCEDURE TRAY) ×2 IMPLANT
KIT TURNOVER KIT A (KITS) ×1 IMPLANT
POUCH SPECIMEN RETRIEVAL 10MM (ENDOMECHANICALS) ×2 IMPLANT
SCISSORS LAP 5X35 DISP (ENDOMECHANICALS) ×2 IMPLANT
SET CHOLANGIOGRAPH MIX (MISCELLANEOUS) IMPLANT
SET IRRIG TUBING LAPAROSCOPIC (IRRIGATION / IRRIGATOR) ×2 IMPLANT
SET TUBE SMOKE EVAC HIGH FLOW (TUBING) ×2 IMPLANT
SLEEVE XCEL OPT CAN 5 100 (ENDOMECHANICALS) ×4 IMPLANT
SUT MNCRL AB 4-0 PS2 18 (SUTURE) ×2 IMPLANT
TOWEL OR 17X26 10 PK STRL BLUE (TOWEL DISPOSABLE) ×2 IMPLANT
TOWEL OR NON WOVEN STRL DISP B (DISPOSABLE) ×2 IMPLANT
TRAY LAPAROSCOPIC (CUSTOM PROCEDURE TRAY) ×2 IMPLANT
TROCAR BLADELESS OPT 5 100 (ENDOMECHANICALS) ×2 IMPLANT
TROCAR XCEL BLUNT TIP 100MML (ENDOMECHANICALS) ×2 IMPLANT

## 2018-09-03 NOTE — Interval H&P Note (Signed)
History and Physical Interval Note: no change in H and P  09/03/2018 12:27 PM  Kendra Hall  has presented today for surgery, with the diagnosis of symptomatic gallstones.  The various methods of treatment have been discussed with the patient and family. After consideration of risks, benefits and other options for treatment, the patient has consented to  Procedure(s): LAPAROSCOPIC CHOLECYSTECTOMY WITH INTRAOPERATIVE CHOLANGIOGRAM (N/A) as a surgical intervention.  The patient's history has been reviewed, patient examined, no change in status, stable for surgery.  I have reviewed the patient's chart and labs.  Questions were answered to the patient's satisfaction.     Coralie Keens

## 2018-09-03 NOTE — Op Note (Signed)
Laparoscopic Cholecystectomy Procedure Note  Indications: This patient presents with symptomatic gallbladder disease and will undergo laparoscopic cholecystectomy.  Pre-operative Diagnosis: symptomatic cholelithiasis  Post-operative Diagnosis: Same  Surgeon: Coralie Keens   Assistants: 0  Anesthesia: General endotracheal anesthesia  ASA Class: 2  Procedure Details  The patient was seen again in the Holding Room. The risks, benefits, complications, treatment options, and expected outcomes were discussed with the patient. The possibilities of reaction to medication, pulmonary aspiration, perforation of viscus, bleeding, recurrent infection, finding a normal gallbladder, the need for additional procedures, failure to diagnose a condition, the possible need to convert to an open procedure, and creating a complication requiring transfusion or operation were discussed with the patient. The likelihood of improving the patient's symptoms with return to their baseline status is good.  The patient and/or family concurred with the proposed plan, giving informed consent. The site of surgery properly noted. The patient was taken to Operating Room, identified as Kendra Hall and the procedure verified as Laparoscopic Cholecystectomy with Intraoperative Cholangiogram. A Time Out was held and the above information confirmed.  Prior to the induction of general anesthesia, antibiotic prophylaxis was administered. General endotracheal anesthesia was then administered and tolerated well. After the induction, the abdomen was prepped with Chloraprep and draped in sterile fashion. The patient was positioned in the supine position.  Local anesthetic agent was injected into the skin near the umbilicus and an incision made. We dissected down to the abdominal fascia with blunt dissection.  The fascia was incised vertically and we entered the peritoneal cavity bluntly.  A pursestring suture of 0-Vicryl was placed  around the fascial opening.  The Hasson cannula was inserted and secured with the stay suture.  Pneumoperitoneum was then created with CO2 and tolerated well without any adverse changes in the patient's vital signs. A 5-mm port was placed in the subxiphoid position.  Two 5-mm ports were placed in the right upper quadrant. All skin incisions were infiltrated with a local anesthetic agent before making the incision and placing the trocars.   We positioned the patient in reverse Trendelenburg, tilted slightly to the patient's left.  The gallbladder was identified, the fundus grasped and retracted cephalad. Adhesions were lysed bluntly and with the electrocautery where indicated, taking care not to injure any adjacent organs or viscus. The infundibulum was grasped and retracted laterally, exposing the peritoneum overlying the triangle of Calot. This was then divided and exposed in a blunt fashion. The cystic duct was clearly identified and bluntly dissected circumferentially. A critical view of the cystic duct and cystic artery was obtained.  The cystic duct was then ligated with clips and divided. The cystic artery was, dissected free, ligated with clips and divided as well.   The gallbladder was dissected from the liver bed in retrograde fashion with the electrocautery. The gallbladder was removed and placed in an Endocatch sac. The liver bed was irrigated and inspected. Hemostasis was achieved with the electrocautery. Copious irrigation was utilized and was repeatedly aspirated until clear.  The gallbladder and Endocatch sac were then removed through the umbilical port site.  The pursestring suture was used to close the umbilical fascia.    We again inspected the right upper quadrant for hemostasis.  Pneumoperitoneum was released as we removed the trocars.  4-0 Monocryl was used to close the skin.  Skin glue was then applied. The patient was then extubated and brought to the recovery room in stable condition.  Instrument, sponge, and needle counts were correct  at closure and at the conclusion of the case.   Findings: Chronic Cholecystitis with Cholelithiasis  Estimated Blood Loss: Minimal         Drains:          Specimens: Gallbladder           Complications: None; patient tolerated the procedure well.         Disposition: PACU - hemodynamically stable.         Condition: stable

## 2018-09-03 NOTE — Anesthesia Postprocedure Evaluation (Signed)
Anesthesia Post Note  Patient: Kendra Hall  Procedure(s) Performed: LAPAROSCOPIC CHOLECYSTECTOMY (N/A Abdomen)     Patient location during evaluation: PACU Anesthesia Type: General Level of consciousness: awake and alert Pain management: pain level controlled Vital Signs Assessment: post-procedure vital signs reviewed and stable Respiratory status: spontaneous breathing, nonlabored ventilation, respiratory function stable and patient connected to nasal cannula oxygen Cardiovascular status: blood pressure returned to baseline and stable Postop Assessment: no apparent nausea or vomiting Anesthetic complications: no    Last Vitals:  Vitals:   09/03/18 1430 09/03/18 1445  BP: (!) 152/84 (!) 158/81  Pulse: 77 79  Resp: 14 14  Temp:    SpO2: 100% 100%    Last Pain:  Vitals:   09/03/18 1445  TempSrc:   PainSc: 8                  Patti Shorb S

## 2018-09-03 NOTE — Discharge Instructions (Signed)
[facility] ______[facility] LAPAROSCOPIC SURGERY: POST OP INSTRUCTIONS Always review your discharge instruction sheet given to you by the facility where your surgery was performed. IF YOU HAVE DISABILITY OR FAMILY LEAVE FORMS, YOU MUST BRING THEM TO THE OFFICE FOR PROCESSING.   DO NOT GIVE THEM TO YOUR DOCTOR.  1. A prescription for pain medication may be given to you upon discharge.  Take your pain medication as prescribed, if needed.  If narcotic pain medicine is not needed, then you may take acetaminophen (Tylenol) or ibuprofen (Advil) as needed. 2. Take your usually prescribed medications unless otherwise directed. 3. If you need a refill on your pain medication, please contact your pharmacy.  They will contact our office to request authorization. Prescriptions will not be filled after 5pm or on week-ends. 4. You should follow a light diet the first few days after arrival home, such as soup and crackers, etc.  Be sure to include lots of fluids daily. 5. Most patients will experience some swelling and bruising in the area of the incisions.  Ice packs will help.  Swelling and bruising can take several days to resolve.  6. It is common to experience some constipation if taking pain medication after surgery.  Increasing fluid intake and taking a stool softener (such as Colace) will usually help or prevent this problem from occurring.  A mild laxative (Milk of Magnesia or Miralax) should be taken according to package instructions if there are no bowel movements after 48 hours. 7. Unless discharge instructions indicate otherwise, you may remove your bandages 24-48 hours after surgery, and you may shower at that time.  You may have steri-strips (small skin tapes) in place directly over the incision.  These strips should be left on the skin for 7-10 days.  If your surgeon used skin glue on the incision, you may shower in 24 hours.  The glue will flake off over the next 2-3 weeks.  Any sutures or  staples will be removed at the office during your follow-up visit. 8. ACTIVITIES:  You may resume regular (light) daily activities beginning the next day--such as daily self-care, walking, climbing stairs--gradually increasing activities as tolerated.  You may have sexual intercourse when it is comfortable.  Refrain from any heavy lifting or straining until approved by your doctor. a. You may drive when you are no longer taking prescription pain medication, you can comfortably wear a seatbelt, and you can safely maneuver your car and apply brakes. b. RETURN TO WORK:  __________________________________________________________ 9. You should see your doctor in the office for a follow-up appointment approximately 2-3 weeks after your surgery.  Make sure that you call for this appointment within a day or two after you arrive home to insure a convenient appointment time. 10. OTHER INSTRUCTIONS:OK TO SHOWER STARTING TOMORROW 11. ICE PACK, TYLENOL ALSO FOR PAIN 12. NO LIFTING MORE THAN 15 POUNDS FOR 2 WEEKS __________________________________________________________________________________________________________________________ __________________________________________________________________________________________________________________________ WHEN TO CALL YOUR DOCTOR: 1. Fever over 101.0 2. Inability to urinate 3. Continued bleeding from incision. 4. Increased pain, redness, or drainage from the incision. 5. Increasing abdominal pain  The clinic staff is available to answer your questions during regular business hours.  Please dont hesitate to call and ask to speak to one of the nurses for clinical concerns.  If you have a medical emergency, go to the nearest emergency room or call 911.  A surgeon from [facility] is always on call at the hospital. [address], [place], [place], [place]  [address] ? [address], [place], [place]   [phone] ? [phone] ? FAX  [phone] Web site: [email or web address]

## 2018-09-03 NOTE — Anesthesia Procedure Notes (Signed)
Procedure Name: Intubation Date/Time: 09/03/2018 1:18 PM Performed by: Pilar Grammes, CRNA Pre-anesthesia Checklist: Patient identified, Emergency Drugs available, Suction available, Patient being monitored and Timeout performed Patient Re-evaluated:Patient Re-evaluated prior to induction Oxygen Delivery Method: Circle system utilized Preoxygenation: Pre-oxygenation with 100% oxygen Induction Type: IV induction Ventilation: Mask ventilation without difficulty Laryngoscope Size: 3 and Mac Grade View: Grade II Tube type: Oral Tube size: 7.0 mm Number of attempts: 1 Airway Equipment and Method: Stylet Placement Confirmation: positive ETCO2,  ETT inserted through vocal cords under direct vision,  CO2 detector and breath sounds checked- equal and bilateral Secured at: 22 cm Tube secured with: Tape Dental Injury: Teeth and Oropharynx as per pre-operative assessment

## 2018-09-03 NOTE — Transfer of Care (Signed)
Immediate Anesthesia Transfer of Care Note  Patient: Kendra Hall  Procedure(s) Performed: LAPAROSCOPIC CHOLECYSTECTOMY (N/A Abdomen)  Patient Location: PACU  Anesthesia Type:General  Level of Consciousness: awake and sedated  Airway & Oxygen Therapy: Patient connected to face mask oxygen  Post-op Assessment: Report given to RN and Post -op Vital signs reviewed and stable  Post vital signs: stable  Last Vitals:  Vitals Value Taken Time  BP 140/80 09/03/2018  2:05 PM  Temp    Pulse 77 09/03/2018  2:06 PM  Resp    SpO2 99 % 09/03/2018  2:06 PM  Vitals shown include unvalidated device data.  Last Pain:  Vitals:   09/03/18 1115  TempSrc: Oral         Complications: No apparent anesthesia complications

## 2018-09-03 NOTE — Anesthesia Preprocedure Evaluation (Signed)
Anesthesia Evaluation  Patient identified by MRN, date of birth, ID band Patient awake    Reviewed: Allergy & Precautions, NPO status , Patient's Chart, lab work & pertinent test results  Airway Mallampati: II  TM Distance: >3 FB Neck ROM: Full    Dental no notable dental hx.    Pulmonary asthma , former smoker,    Pulmonary exam normal breath sounds clear to auscultation       Cardiovascular hypertension, Normal cardiovascular exam Rhythm:Regular Rate:Normal     Neuro/Psych Anxiety Depression TIA   GI/Hepatic Neg liver ROS, GERD  ,  Endo/Other  diabetesHypothyroidism   Renal/GU negative Renal ROS  negative genitourinary   Musculoskeletal negative musculoskeletal ROS (+)   Abdominal   Peds negative pediatric ROS (+)  Hematology negative hematology ROS (+)   Anesthesia Other Findings   Reproductive/Obstetrics negative OB ROS                             Anesthesia Physical Anesthesia Plan  ASA: III  Anesthesia Plan: General   Post-op Pain Management:    Induction: Intravenous  PONV Risk Score and Plan: 3 and Ondansetron, Dexamethasone and Treatment may vary due to age or medical condition  Airway Management Planned: Oral ETT  Additional Equipment:   Intra-op Plan:   Post-operative Plan: Extubation in OR  Informed Consent: I have reviewed the patients History and Physical, chart, labs and discussed the procedure including the risks, benefits and alternatives for the proposed anesthesia with the patient or authorized representative who has indicated his/her understanding and acceptance.     Dental advisory given  Plan Discussed with: CRNA and Surgeon  Anesthesia Plan Comments:         Anesthesia Quick Evaluation

## 2018-09-04 ENCOUNTER — Encounter (HOSPITAL_COMMUNITY): Payer: Self-pay | Admitting: Surgery

## 2018-09-07 ENCOUNTER — Other Ambulatory Visit: Payer: Self-pay | Admitting: Neurology

## 2018-09-15 ENCOUNTER — Telehealth: Payer: Self-pay

## 2018-09-15 NOTE — Telephone Encounter (Signed)
Called and spoke with Pt. She would like to change her appointment to an e-visit. Sent message to scheduling to contact Pt, who is expecting their call to schedule. Confirmed email and web capabilities. Pt is also expecting a My Chart message from me to confirm her medications and weight. She does not have a thermometer to check her temperature.

## 2018-09-17 ENCOUNTER — Other Ambulatory Visit: Payer: Self-pay

## 2018-09-17 ENCOUNTER — Ambulatory Visit: Payer: 59 | Admitting: Neurology

## 2018-09-17 ENCOUNTER — Telehealth (INDEPENDENT_AMBULATORY_CARE_PROVIDER_SITE_OTHER): Payer: 59 | Admitting: Neurology

## 2018-09-17 VITALS — BP 150/86 | Wt 203.0 lb

## 2018-09-17 DIAGNOSIS — G43009 Migraine without aura, not intractable, without status migrainosus: Secondary | ICD-10-CM | POA: Diagnosis not present

## 2018-09-17 NOTE — Progress Notes (Signed)
Virtual Visit via Video Note The purpose of this virtual visit is to provide medical care while limiting exposure to the novel coronavirus.    Consent was obtained for video visit:  Yes.   Answered questions that patient had about telehealth interaction:  Yes.   I discussed the limitations, risks, security and privacy concerns of performing an evaluation and management service by telemedicine. I also discussed with the patient that there may be a patient responsible charge related to this service. The patient expressed understanding and agreed to proceed.  Pt location: Home Physician Location: office Name of referring provider:  Simona Huh, NP I connected with Kendra Hall at patients initiation/request on 09/17/2018 at  2:00 PM EDT by video enabled telemedicine application and verified that I am speaking with the correct person using two identifiers. Pt MRN:  [id number] Pt DOB:  11/15/1965   History of Present Illness:  Kendra Hall is a 53 year old woman with type 2 diabetes mellitus, hypothyroidism, fibromyalgia and depression who follows up for migraines.  UPDATE: Following last visit, headaches improved.  She had maybe one or two since November.  Last week, she was waking up with severe headaches for 3 days, lasting up to 3 hours.  This week, she had one this week.  She took the rizatriptan and headache resolved after 1 to 2 hours.  Since starting the [facility], she reports some hives in the back of her leg. Intensity:  severe Duration:  When she wakes up, they last 1.5 to 2 hours Frequency:  Daily Frequency of abortive medication: daily Current NSAIDS:  none Current analgesics:  Hydrocodone most days for fibromyalgia but has been treating headaches with them as well Current triptans: Maxalt MLT 10 mg Current ergotamine: None Current anti-emetic: Promethazine 25 mg Current muscle relaxants:  baclofen Current anti-anxiolytic:  BuSpar Current sleep aide: None Current  Antihypertensive medications: Would not use beta-blocker due to asthma Current Antidepressant medications:  Cymbalta, Lyrica Current Anticonvulsant medications: none Current anti-CGRP:  Emgality (first dose 10/31) Current Vitamins/Herbal/Supplements: None Current Antihistamines/Decongestants: None Other therapy: None  Caffeine: No Diet: Drinks Exercise: Not routine Depression: Yes; Anxiety: Yes Other pain: Neck pain, fibromyalgia Sleep hygiene: Good  HISTORY:  Onset: She has had history of migraines since her 15s, but they became frequent in 2018. Location: Varies: back of head, across forehead, temples Quality: Varies: squeezing, sharp Initial Intensity: 10/10 Aura: no Prodrome: no Postdrome: no Associated symptoms: Nausea, photophobia, phonophobia.  She denies associated vomiting, visual disturbance, or unilateral numbness or weakness.  It is not a new thunderclap headache. Initial Duration: All day (usually lets up in 30 minutes after taking Maxalt, however lately headache returns in 2 hours) Initial Frequency: Every other day Initial Frequency of abortive medication: every other day Triggers: None Relieving factors: Sleep Activity: aggravates  Past NSAIDS: ibuprofen Past analgesics: Tylenol, Excedrin Migraine Past abortive triptans: Sumatriptan tablet Past muscle relaxants: no Past anti-emetic: no Past antihypertensive medications: no Past antidepressant medications: sertraline 50mg ,nortriptyline 75mg  as needed for sleep (cannot take nightly due to extreme dry mouth), Lexapro 20mg  Past anticonvulsant medications: topiramate immediate release 100mg  (cognitive problems), Topiramate ER 100 mg (effective) Past vitamins/Herbal/Supplements: no Other past therapies: no  Family history of headache: cousins  MRI and MRA of head from 03/15/15 were personally reviewed and were unremarkable except for minimal punctate white matter foci.  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  CT cervical spine from 01/09/18 showed cervical spondylosis with degenerative changes greatest at C4-C7 levels  with bilateral neural foraminal stenosis, as well as mild multifactorial C5-6 canal stenosis.  She is treated by pain management.  Observations/Objective:   Blood pressure (!) 150/86, weight 203 lb (92.1 kg), last menstrual period 12/06/2016. alert and oriented to person, place, and time. Attention span and concentration intact, recent and remote memory intact, fund of knowledge intact.  Speech fluent and not dysarthric, language intact.  Pupils round and equal.  Eyes move in all directions.  Face symmetric.  Facial sensation intact.  Tongue midline. Moves all extremities against gravity.  Finger to nose testing intact.  Gait normal, Romberg negative.  Assessment and Plan:   Migraine without aura, without status migrainosus, not intractable  1.  For preventative management, continue Emgality.  She states the benefits of the Emgality outweigh the hives, so she doesn't want to make any changes 2.  For abortive therapy, Maxalt MLT 10mg  3.  Limit use of pain relievers to no more than 2 days out of week to prevent risk of rebound or medication-overuse headache. 4.  Keep headache diary 5.  Exercise, hydration, caffeine cessation, sleep hygiene, monitor for and avoid triggers 6.  Consider:  magnesium citrate 400mg  daily, riboflavin 400mg  daily, and coenzyme Q10 100mg  three times daily 7.  Follow up in 5 to 6 months.   Follow Up Instructions:    -I discussed the assessment and treatment plan with the patient. The patient was provided an opportunity to ask questions and all were answered. The patient agreed with the plan and demonstrated an understanding of the instructions.   The patient was advised to call back or seek an in-person evaluation if the symptoms worsen or if the condition fails to improve as anticipated.    Total Time spent in visit with the patient was:   16 minutes, of which more than 50% of the time was spent in counseling and/or coordinating care on above plan.   Pt understands and agrees with the plan of care outlined.     Dudley Major, DO

## 2018-09-17 NOTE — Patient Instructions (Signed)
Continue Emgality Continue Maxalt Follow up in 6 months

## 2018-09-19 ENCOUNTER — Other Ambulatory Visit: Payer: Self-pay | Admitting: Endocrinology

## 2018-10-09 ENCOUNTER — Ambulatory Visit: Payer: 59 | Admitting: Endocrinology

## 2018-11-06 ENCOUNTER — Other Ambulatory Visit: Payer: Self-pay | Admitting: Endocrinology

## 2018-11-20 ENCOUNTER — Ambulatory Visit: Payer: 59 | Admitting: Endocrinology

## 2018-11-25 ENCOUNTER — Telehealth: Payer: Self-pay | Admitting: Neurology

## 2018-11-25 NOTE — Telephone Encounter (Signed)
Patient left message with answering service regarding having Leg Muscle Spasms. Please Call. Thanks

## 2018-11-25 NOTE — Telephone Encounter (Signed)
Patient called back regarding a Migraines that she has had since Yesterday. She said her whole head hurts. Today it's at 6 in pain level. She said her nerves "keep jumping"  All over. Patient said that she has had a TIA in the past. Please Call.

## 2018-11-25 NOTE — Telephone Encounter (Signed)
Eyes are hurting as well. Thanks

## 2018-11-26 ENCOUNTER — Other Ambulatory Visit: Payer: Self-pay

## 2018-11-26 ENCOUNTER — Ambulatory Visit (INDEPENDENT_AMBULATORY_CARE_PROVIDER_SITE_OTHER): Payer: 59

## 2018-11-26 ENCOUNTER — Ambulatory Visit (INDEPENDENT_AMBULATORY_CARE_PROVIDER_SITE_OTHER): Payer: 59 | Admitting: Endocrinology

## 2018-11-26 ENCOUNTER — Encounter: Payer: Self-pay | Admitting: Endocrinology

## 2018-11-26 ENCOUNTER — Ambulatory Visit: Payer: 59 | Admitting: Endocrinology

## 2018-11-26 VITALS — BP 152/82 | HR 98 | Temp 98.3°F | Wt 208.4 lb

## 2018-11-26 DIAGNOSIS — I10 Essential (primary) hypertension: Secondary | ICD-10-CM | POA: Diagnosis not present

## 2018-11-26 DIAGNOSIS — G43009 Migraine without aura, not intractable, without status migrainosus: Secondary | ICD-10-CM

## 2018-11-26 DIAGNOSIS — E119 Type 2 diabetes mellitus without complications: Secondary | ICD-10-CM

## 2018-11-26 LAB — POCT GLYCOSYLATED HEMOGLOBIN (HGB A1C): Hemoglobin A1C: 10.6 % — AB (ref 4.0–5.6)

## 2018-11-26 MED ORDER — INSULIN GLARGINE-LIXISENATIDE 100-33 UNT-MCG/ML ~~LOC~~ SOPN: 20.0000 [IU] | PEN_INJECTOR | SUBCUTANEOUS | 11 refills | Status: DC

## 2018-11-26 MED ORDER — METOCLOPRAMIDE HCL 5 MG/ML IJ SOLN
10.0000 mg | Freq: Once | INTRAMUSCULAR | Status: AC
  Administered 2018-11-26: 10 mg via INTRAMUSCULAR

## 2018-11-26 MED ORDER — KETOROLAC TROMETHAMINE 60 MG/2ML IM SOLN
60.0000 mg | Freq: Once | INTRAMUSCULAR | Status: AC
  Administered 2018-11-26: 60 mg via INTRAMUSCULAR

## 2018-11-26 MED ORDER — DIPHENHYDRAMINE HCL 50 MG/ML IJ SOLN
50.0000 mg | Freq: Once | INTRAMUSCULAR | Status: AC
  Administered 2018-11-26: 50 mg via INTRAMUSCULAR

## 2018-11-26 NOTE — Telephone Encounter (Signed)
Patient is returning a call she left VM to please call her back

## 2018-11-26 NOTE — Patient Instructions (Addendum)
Your blood pressure is high today.  Please see your primary care provider soon, to have it rechecked. I have sent a prescription to your pharmacy, to start soliqua, 20 units each morning. This replaces the victoza and the glimepiride. Please continue the same Tresiba check your blood sugar once a day.  vary the time of day when you check, between before the 3 meals, and at bedtime.  also check if you have symptoms of your blood sugar being too high or too low.  please keep a record of the readings and bring it to your next appointment here (or you can bring the meter itself).  You can write it on any piece of paper.  please call us sooner if your blood sugar goes below 70, or if you have a lot of readings over 200.   Please come back for a follow-up appointment in 1 month.

## 2018-11-26 NOTE — Telephone Encounter (Signed)
Called and advised Pt. She states she is unable to take steroids; she does have a driver; she is coming now.

## 2018-11-26 NOTE — Telephone Encounter (Addendum)
Left message for pt to return call.

## 2018-11-26 NOTE — Telephone Encounter (Signed)
If she has a driver, she can come in for a headache cocktail.  Otherwise, prednisone taper

## 2018-11-26 NOTE — Progress Notes (Signed)
Pt arrives today for headache cocktail. Reason intractable migraine   Written order from Dr. Tomi Likens see telephone note from today.   Headache cocktail receipt given Diphenhydramine 50 mg Ketorolac 60 mg  Metoclopramide 10mg /4ml

## 2018-11-26 NOTE — Progress Notes (Signed)
Subjective:    Patient ID: Kendra Hall, female    DOB: April 28, 1966, 53 y.o.   MRN: [id number]  HPI Pt returns for f/u of diabetes mellitus:  DM type: 2.  Dx'ed: 6659 Complications: renal insuff.    Therapy: trulicity, victoza, and glimepiride.  GDM: 1984.   DKA: never.   Severe hypoglycemia: once, in 2018.  Pancreatitis: never.  Other: metformin caused dizziness; she takes both trulicity and victoza, and she wants to continue; she took insulin 2012-2018, and declines to resume.  she did not tolerate invokana (vaginitis); renal insuff limits rx options.   Interval history:  no cbg record, but states cbg's vary from 98-300. No recent steroids.  She still sometimes misses her meds.  Main symptom is migraine.   Past Medical History:  Diagnosis Date  . Allergy   . Anemia    history of  . Anxiety   . Asthma   . Depression   . Diabetes mellitus    type 2   . Fibromyalgia   . Gastric ulcer   . GERD (gastroesophageal reflux disease)   . Grave's disease   . Graves disease   . H/O therapeutic radiation   . Migraine   . Multiple thyroid nodules   . Myofacial muscle pain   . Osteoarthritis    Facet hypertrophy with injections  . TIA (transient ischemic attack)    02/2015    Past Surgical History:  Procedure Laterality Date  . AXILLARY LYMPH NODE DISSECTION  2001  . CHOLECYSTECTOMY N/A 09/03/2018   Procedure: LAPAROSCOPIC CHOLECYSTECTOMY;  Surgeon: Coralie Keens, MD;  Location: [facility];  Service: General;  Laterality: N/A;  . HYDRADENITIS EXCISION    . TUBAL LIGATION  1995  . VAGINAL HYSTERECTOMY Left 03/11/2017   Procedure: HYSTERECTOMY VAGINAL W/LEFT PROXIMAL SALPINGECTOMY;  Surgeon: Princess Bruins, MD;  Location: [facility];  Service: Gynecology;  Laterality: Left;  request 7:30am OR time  request 2 hours      Social History   Socioeconomic History  . Marital status: Single    Spouse name: Not on file  . Number of children: 4  . Years of education: 43  .  Highest education level: Not on file  Occupational History  . Occupation: Research scientist (physical sciences): [employer]  . Financial resource strain: Not on file  . Food insecurity:    Worry: Not on file    Inability: Not on file  . Transportation needs:    Medical: Not on file    Non-medical: Not on file  Tobacco Use  . Smoking status: Former Smoker    Packs/day: 0.50    Years: 30.00    Pack years: 15.00    Types: Cigarettes    Last attempt to quit: 04/24/2018    Years since quitting: 0.5  . Smokeless tobacco: Never Used  Substance and Sexual Activity  . Alcohol use: No    Comment: rare  . 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  Today denies all drug abuse  . Sexual activity: Not Currently    Birth control/protection: Surgical  Lifestyle  . Physical activity:    Days per week: Not on file    Minutes per session: Not on file  . Stress: Not on file  Relationships  . Social connections:    Talks on phone: Not on file  Gets together: Not on file    Attends religious service: Not on file    Active member of club or organization: Not on file    Attends meetings of clubs or organizations: Not on file    Relationship status: Not on file  . Intimate partner violence:    Fear of current or ex partner: Not on file    Emotionally abused: Not on file    Physically abused: Not on file    Forced sexual activity: Not on file  Other Topics Concern  . Not on file  Social History Narrative   Lives alone in [place]. Her daugther occassoinally comes and stay with her. 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  originally from [place],  [place].  Raised by mom and her sister who is 8 yrs older than pt. Pt has several half siblings. No longer on disability. Pt has 4 kids, never married.           Current Outpatient  Medications on File Prior to Visit  Medication Sig Dispense Refill  . albuterol (PROAIR HFA) 108 (90 Base) MCG/ACT inhaler Inhale 2 puffs into the lungs every 4 (four) hours as needed for wheezing. 1 Inhaler 2  . albuterol (PROVENTIL) (2.5 MG/3ML) 0.083% nebulizer solution Take 3 mLs by nebulization 4 (four) times daily as needed for wheezing or shortness of breath.   3  . ASPIRIN LOW DOSE 81 MG EC tablet Take 81 mg by mouth daily.    . baclofen (LIORESAL) 10 MG tablet TAKE 1 TABLET BY MOUTH THREE TIMES DAILY AS NEEDED FOR MUSCLE SPASMS 270 tablet 0  . BD PEN NEEDLE NANO U/F 32G X 4 MM MISC USE FOR INSULIN THREE TIMES DAILY 100 each 0  . benzonatate (TESSALON) 100 MG capsule Take 1 capsule (100 mg total) by mouth 2 (two) times daily as needed for cough. 20 capsule 0  . Blood Glucose Calibration (ONETOUCH VERIO) SOLN Used to check meter accuracy 1 each 0  . Blood Glucose Monitoring Suppl (ONETOUCH VERIO) w/Device KIT     . CHANTIX 1 MG tablet Take 1 mg by mouth 2 (two) times daily.    . DULoxetine (CYMBALTA) 30 MG capsule Take 1 capsule (30 mg total) by mouth 2 (two) times daily. 180 capsule 3  . EMGALITY 120 MG/ML SOAJ ADMINISTER 1 ML UNDER THE SKIN EVERY 30 DAYS 1 mL 11  . glucose blood (ONETOUCH VERIO) test strip 1 each by Other route 2 (two) times daily. use for testing 150 each 0  . Lancets (ONETOUCH DELICA PLUS [id number]) MISC USE FOUR TIMES DAILY AS DIRECTED 500 each 0  . levothyroxine (SYNTHROID, LEVOTHROID) 125 MCG tablet Take 125 mcg by mouth daily before breakfast.    . lidocaine (LIDODERM) 5 % UNWRAP AND APPLY 1 PATCH TO SKIN DAILY FOR 15 DAYS. REMOVE AND DISCARD PATCH WITHIN 12 HOURS OR AS DIRECTED (Patient taking differently: Place 1 patch onto the skin daily as needed (pain). ) 15 patch 0  . Lifitegrast (XIIDRA) 5 % SOLN Place 1 drop into both eyes daily as needed (dry eyes).     Marland Kitchen linaclotide (LINZESS) 290 MCG CAPS capsule Take 290 mcg by mouth daily before breakfast.     .  Magnesium 250 MG TABS Take 1,000 mg by mouth at bedtime.    . montelukast (SINGULAIR) 10 MG tablet Take 10 mg by mouth at bedtime.   3  . omeprazole (  PRILOSEC) 40 MG capsule Take 40 mg by mouth 2 (two) times daily.     . ondansetron (ZOFRAN) 4 MG tablet Take 1 tablet (4 mg total) by mouth every 8 (eight) hours as needed for nausea or vomiting. 20 tablet 2  . oxyCODONE (OXY IR/ROXICODONE) 5 MG immediate release tablet Take 1 tablet (5 mg total) by mouth every 6 (six) hours as needed for moderate pain, severe pain or breakthrough pain. 25 tablet 0  . pravastatin (PRAVACHOL) 10 MG tablet Take 10 mg by mouth every evening.    . pregabalin (LYRICA) 75 MG capsule Take 75 mg by mouth 2 (two) times daily as needed (NERVE PAIN).    . rizatriptan (MAXALT-MLT) 10 MG disintegrating tablet TAKE 1 TABLET BY MOUTH AT EARLIEST ONSET OF HEADACHE, MAY REPEAT ONCE IN 2 HOURS IF NEEDED 9 tablet 3  . TRULICITY 1.5 WV/3.7TG SOPN INJECT 1.5MG UNDER THE SKIN ONCE EVERY WEEK ON TUESDAY (Patient taking differently: Inject 1.5 mg into the muscle every Tuesday. ) 2 mL 2  . Vitamin D, Ergocalciferol, (DRISDOL) 1.25 MG (50000 UT) CAPS capsule Take 50,000 Units by mouth every 7 (seven) days.     No current facility-administered medications on file prior to visit.     Allergies  Allergen Reactions  . Ergotrate [Ergonovine] Other (See Comments)    Pt does not ever want  . Ibuprofen Other (See Comments)    Patient has ulcer  . Metformin And Related Other (See Comments)    Dizziness  . Yutopar [Ritodrine] Other (See Comments)    Pt does not ever want    Family History  Problem Relation Age of Onset  . Hypertension Mother   . Diabetes Mother   . Cancer Mother        rectal  . Heart disease Father 20       MI x5  . Hypertension Father   . Diabetes Father   . Drug abuse Brother   . Alcohol abuse Brother   . Breast cancer Sister        unsure of age , was between 84 and 14  . Drug abuse Brother   . Alcohol abuse  Brother   . Anxiety disorder Neg Hx   . Bipolar disorder Neg Hx   . Depression Neg Hx     BP (!) 152/82 (BP Location: Right Arm, Patient Position: Sitting, Cuff Size: Normal)   Pulse 98   Temp 98.3 F (36.8 C) (Oral)   Wt 208 lb 6.4 oz (94.5 kg)   LMP 12/06/2016 (Approximate)   SpO2 96%   BMI 36.93 kg/m    Review of Systems She denies hypoglycemia.       Objective:   Physical Exam VITAL SIGNS:  See vs page GENERAL: no distress Pulses: dorsalis pedis intact bilat.   MSK: no deformity of the feet CV: no leg edema Skin:  no ulcer on the feet.  normal color and temp on the feet. Neuro: sensation is intact to touch on the feet  A1c=10.6%     Assessment & Plan:  HTN: is noted today Type 2 DM: worse. We discussed.  She agrees to try soliqua.   Patient Instructions  Your blood pressure is high today.  Please see your primary care provider soon, to have it rechecked. I have sent a prescription to your pharmacy, to start soliqua, 20 units each morning. This replaces the victoza and the glimepiride. Please continue the same Tresiba check your blood sugar once a day.  vary the time of day when you check, between before the 3 meals, and at bedtime.  also check if you have symptoms of your blood sugar being too high or too low.  please keep a record of the readings and bring it to your next appointment here (or you can bring the meter itself).  You can write it on any piece of paper.  please call us sooner if your blood sugar goes below 70, or if you have a lot of readings over 200.   Please come back for a follow-up appointment in 1 month.

## 2018-11-26 NOTE — Telephone Encounter (Signed)
Pt had Migraine to start on June 2nd. Started all of a sudden.  She had nausea and was unable to focus. Pt also c/o nerve twitches.  Pt was not due for her Emgality until the 6th of June but she took it the day her Migraine started.  She continues to have a HA.  Pt has not taken Maxalt lately. She denies taking OTC pain relievers.  Pt admits to taking oxycodone nightly. She has a prescription for it to take BID for her Fibromyalgia.

## 2018-11-28 ENCOUNTER — Other Ambulatory Visit: Payer: Self-pay | Admitting: Endocrinology

## 2018-11-29 ENCOUNTER — Emergency Department (HOSPITAL_COMMUNITY)
Admission: EM | Admit: 2018-11-29 | Discharge: 2018-11-29 | Disposition: A | Discharge status: Home / Self Care | Payer: 59 | Attending: Emergency Medicine | Admitting: Emergency Medicine

## 2018-11-29 ENCOUNTER — Encounter (HOSPITAL_COMMUNITY): Payer: Self-pay | Admitting: Emergency Medicine

## 2018-11-29 ENCOUNTER — Other Ambulatory Visit: Payer: Self-pay

## 2018-11-29 ENCOUNTER — Emergency Department (HOSPITAL_COMMUNITY): Payer: 59

## 2018-11-29 DIAGNOSIS — E119 Type 2 diabetes mellitus without complications: Secondary | ICD-10-CM | POA: Insufficient documentation

## 2018-11-29 DIAGNOSIS — Z79899 Other long term (current) drug therapy: Secondary | ICD-10-CM | POA: Diagnosis not present

## 2018-11-29 DIAGNOSIS — Z87891 Personal history of nicotine dependence: Secondary | ICD-10-CM | POA: Diagnosis not present

## 2018-11-29 DIAGNOSIS — T675XXA Heat exhaustion, unspecified, initial encounter: Secondary | ICD-10-CM

## 2018-11-29 DIAGNOSIS — I10 Essential (primary) hypertension: Secondary | ICD-10-CM | POA: Insufficient documentation

## 2018-11-29 DIAGNOSIS — Z91040 Latex allergy status: Secondary | ICD-10-CM | POA: Diagnosis not present

## 2018-11-29 DIAGNOSIS — J4520 Mild intermittent asthma, uncomplicated: Secondary | ICD-10-CM | POA: Insufficient documentation

## 2018-11-29 DIAGNOSIS — E039 Hypothyroidism, unspecified: Secondary | ICD-10-CM | POA: Insufficient documentation

## 2018-11-29 DIAGNOSIS — Z8673 Personal history of transient ischemic attack (TIA), and cerebral infarction without residual deficits: Secondary | ICD-10-CM | POA: Diagnosis not present

## 2018-11-29 DIAGNOSIS — R55 Syncope and collapse: Secondary | ICD-10-CM | POA: Diagnosis not present

## 2018-11-29 DIAGNOSIS — Z794 Long term (current) use of insulin: Secondary | ICD-10-CM | POA: Diagnosis not present

## 2018-11-29 DIAGNOSIS — E860 Dehydration: Secondary | ICD-10-CM

## 2018-11-29 DIAGNOSIS — R42 Dizziness and giddiness: Secondary | ICD-10-CM | POA: Diagnosis present

## 2018-11-29 LAB — CBC WITH DIFFERENTIAL/PLATELET
Abs Immature Granulocytes: 0.05 10*3/uL (ref 0.00–0.07)
Basophils Absolute: 0 10*3/uL (ref 0.0–0.1)
Basophils Relative: 0 %
Eosinophils Absolute: 0.1 10*3/uL (ref 0.0–0.5)
Eosinophils Relative: 2 %
HCT: 48 % — ABNORMAL HIGH (ref 36.0–46.0)
Hemoglobin: 15.9 g/dL — ABNORMAL HIGH (ref 12.0–15.0)
Immature Granulocytes: 1 %
Lymphocytes Relative: 31 %
Lymphs Abs: 2.6 10*3/uL (ref 0.7–4.0)
MCH: 27.3 pg (ref 26.0–34.0)
MCHC: 33.1 g/dL (ref 30.0–36.0)
MCV: 82.5 fL (ref 80.0–100.0)
Monocytes Absolute: 0.6 10*3/uL (ref 0.1–1.0)
Monocytes Relative: 7 %
Neutro Abs: 5.1 10*3/uL (ref 1.7–7.7)
Neutrophils Relative %: 59 %
Platelets: 355 10*3/uL (ref 150–400)
RBC: 5.82 MIL/uL — ABNORMAL HIGH (ref 3.87–5.11)
RDW: 13.2 % (ref 11.5–15.5)
WBC: 8.5 10*3/uL (ref 4.0–10.5)
nRBC: 0 % (ref 0.0–0.2)

## 2018-11-29 LAB — COMPREHENSIVE METABOLIC PANEL
ALT: 52 U/L — ABNORMAL HIGH (ref 0–44)
AST: 43 U/L — ABNORMAL HIGH (ref 15–41)
Albumin: 4 g/dL (ref 3.5–5.0)
Alkaline Phosphatase: 314 U/L — ABNORMAL HIGH (ref 38–126)
Anion gap: 13 (ref 5–15)
BUN: 11 mg/dL (ref 6–20)
CO2: 22 mmol/L (ref 22–32)
Calcium: 9.3 mg/dL (ref 8.9–10.3)
Chloride: 104 mmol/L (ref 98–111)
Creatinine, Ser: 1.12 mg/dL — ABNORMAL HIGH (ref 0.44–1.00)
GFR calc Af Amer: >60 mL/min (ref >60)
GFR calc non Af Amer: 56 mL/min — ABNORMAL LOW (ref >60)
Glucose, Bld: 258 mg/dL — ABNORMAL HIGH (ref 70–99)
Potassium: 3.3 mmol/L — ABNORMAL LOW (ref 3.5–5.1)
Sodium: 139 mmol/L (ref 135–145)
Total Bilirubin: 1.1 mg/dL (ref 0.3–1.2)
Total Protein: 7.7 g/dL (ref 6.5–8.1)

## 2018-11-29 LAB — URINALYSIS, ROUTINE W REFLEX MICROSCOPIC
Bilirubin Urine: NEGATIVE
Glucose, UA: NEGATIVE mg/dL
Hgb urine dipstick: NEGATIVE
Ketones, ur: NEGATIVE mg/dL
Leukocytes,Ua: NEGATIVE
Nitrite: NEGATIVE
Protein, ur: NEGATIVE mg/dL
Specific Gravity, Urine: 1.043 — ABNORMAL HIGH (ref 1.005–1.030)
pH: 5 (ref 5.0–8.0)

## 2018-11-29 LAB — D-DIMER, QUANTITATIVE: D-Dimer, Quant: 0.75 ug/mL-FEU — ABNORMAL HIGH (ref 0.00–0.50)

## 2018-11-29 LAB — CK: Total CK: 159 U/L (ref 38–234)

## 2018-11-29 MED ORDER — ONDANSETRON HCL 4 MG/2ML IJ SOLN
4.0000 mg | Freq: Once | INTRAMUSCULAR | Status: AC
  Administered 2018-11-29: 4 mg via INTRAVENOUS
  Filled 2018-11-29: qty 2

## 2018-11-29 MED ORDER — ACETAMINOPHEN 500 MG PO TABS
1000.0000 mg | ORAL_TABLET | Freq: Once | ORAL | Status: AC
  Administered 2018-11-29: 1000 mg via ORAL
  Filled 2018-11-29: qty 2

## 2018-11-29 MED ORDER — SODIUM CHLORIDE 0.9 % IV BOLUS
1000.0000 mL | Freq: Once | INTRAVENOUS | Status: AC
  Administered 2018-11-29: 1000 mL via INTRAVENOUS

## 2018-11-29 MED ORDER — IOHEXOL 350 MG/ML SOLN
75.0000 mL | Freq: Once | INTRAVENOUS | Status: AC | PRN
  Administered 2018-11-29: 75 mL via INTRAVENOUS

## 2018-11-29 NOTE — ED Provider Notes (Signed)
[facility] EMERGENCY DEPARTMENT Provider Note   CSN: [id number] Arrival date & time: 11/29/18  1701    History   Chief Complaint Chief Complaint  Patient presents with  . Dizziness  . Nausea    HPI Kendra Hall is a 53 y.o. female.     HPI Patient presents to the emergency department with syncopal episode that occurred while she was outside today walking.  The patient states she has not eaten all day and she was at a protest when she felt like she was getting flushed and dizzy.  The patient states she vomited once.  The patient states that nothing seems to make the condition better or worse.  Patient states she was drinking water at the time but had not had a lot to drink prior to that.  The patient denies chest pain, shortness of breath, headache,blurred vision, neck pain, fever, cough, weakness, numbness, dizziness, anorexia, edema, abdominal pain,diarrhea, rash, back pain, dysuria, hematemesis, bloody stool Past Medical History:  Diagnosis Date  . Allergy   . Anemia    history of  . Anxiety   . Asthma   . Depression   . Diabetes mellitus    type 2   . Fibromyalgia   . Gastric ulcer   . GERD (gastroesophageal reflux disease)   . Grave's disease   . Graves disease   . H/O therapeutic radiation   . Migraine   . Multiple thyroid nodules   . Myofacial muscle pain   . Osteoarthritis    Facet hypertrophy with injections  . TIA (transient ischemic attack)    02/2015    Patient Active Problem List   Diagnosis Date Noted  . Lt facial numbness 09/25/2017  . Back pain 06/15/2017  . Postoperative state 03/11/2017  . Trigger point 11/28/2016  . Health care maintenance 02/04/2016  . Severe episode of recurrent major depressive disorder, with psychotic features ([place]) 08/22/2015  . GAD (generalized anxiety disorder) 08/22/2015  . PTSD (post-traumatic stress disorder) 08/22/2015  . Insomnia 08/22/2015  . GERD (gastroesophageal reflux disease) 04/18/2015   . Fibromyalgia 03/29/2015  . Type 2 diabetes mellitus without complication ([place])   . TIA (transient ischemic attack) 03/15/2015  . Mild intermittent asthma 02/28/2012  . Anxiety disorder 02/07/2012  . Hypertension 01/07/2012  . Hypothyroidism following radioiodine therapy 08/28/2011  . Grave's disease 05/07/2011  . Family hx-breast malignancy 11/20/2010  . Chronic migraine without aura 05/04/2010  . Left shoulder pain 09/08/2009  . TOBACCO USER 03/11/2009  . Notalgia 01/09/2009  . OBESITY, UNSPECIFIED 09/27/2008  . Mood disorder ([place]) 09/22/2008    Past Surgical History:  Procedure Laterality Date  . AXILLARY LYMPH NODE DISSECTION  2001  . CHOLECYSTECTOMY N/A 09/03/2018   Procedure: LAPAROSCOPIC CHOLECYSTECTOMY;  Surgeon: Coralie Keens, MD;  Location: [facility];  Service: General;  Laterality: N/A;  . HYDRADENITIS EXCISION    . TUBAL LIGATION  1995  . VAGINAL HYSTERECTOMY Left 03/11/2017   Procedure: HYSTERECTOMY VAGINAL W/LEFT PROXIMAL SALPINGECTOMY;  Surgeon: Princess Bruins, MD;  Location: [facility];  Service: Gynecology;  Laterality: Left;  request 7:30am OR time  request 2 hours       OB History    Gravida  5   Para      Term      Preterm      AB  2   Living  4     SAB      TAB      Ectopic  0  Multiple      Live Births               Home Medications    Prior to Admission medications   Medication Sig Start Date End Date Taking? Authorizing Provider  albuterol (PROAIR HFA) 108 (90 Base) MCG/ACT inhaler Inhale 2 puffs into the lungs every 4 (four) hours as needed for wheezing. 07/31/17  Yes Nicolette Bang, DO  albuterol (PROVENTIL) (2.5 MG/3ML) 0.083% nebulizer solution Take 3 mLs by nebulization 4 (four) times daily as needed for wheezing or shortness of breath.  03/02/18  Yes [provider]  baclofen (LIORESAL) 10 MG tablet TAKE 1 TABLET BY MOUTH THREE TIMES DAILY AS NEEDED FOR MUSCLE SPASMS Patient taking differently: Take  10 mg by mouth 3 (three) times daily as needed for muscle spasms.  04/24/18  Yes Bland, Scott, DO  benzonatate (TESSALON) 100 MG capsule Take 1 capsule (100 mg total) by mouth 2 (two) times daily as needed for cough. 01/07/18  Yes Lind Covert, MD  cimetidine (TAGAMET) 200 MG tablet Take 200 mg by mouth 2 (two) times daily.   Yes [provider]  DULoxetine (CYMBALTA) 30 MG capsule Take 1 capsule (30 mg total) by mouth 2 (two) times daily. 09/22/17  Yes Nicolette Bang, DO  EMGALITY 120 MG/ML SOAJ ADMINISTER 1 ML UNDER THE SKIN EVERY 30 DAYS Patient taking differently: Inject 1 mL into the skin every 30 (thirty) days.  09/07/18  Yes Jaffe, Adam R, DO  hydrOXYzine (VISTARIL) 25 MG capsule Take 25 mg by mouth 3 (three) times daily as needed for itching.   Yes [provider]  Insulin Glargine-Lixisenatide (SOLIQUA) 100-33 UNT-MCG/ML SOPN Inject 20 Units into the skin every morning. 11/26/18  Yes Renato Shin, MD  levothyroxine (SYNTHROID, LEVOTHROID) 125 MCG tablet Take 125 mcg by mouth daily before breakfast.   Yes [provider]  lidocaine (LIDODERM) 5 % UNWRAP AND APPLY 1 PATCH TO SKIN DAILY FOR 15 DAYS. REMOVE AND DISCARD PATCH WITHIN 12 HOURS OR AS DIRECTED Patient taking differently: Place 1 patch onto the skin daily as needed (pain).  02/09/18  Yes Bland, Scott, DO  Lifitegrast (XIIDRA) 5 % SOLN Place 1 drop into both eyes daily as needed (dry eyes).    Yes [provider]  linaclotide (LINZESS) 290 MCG CAPS capsule Take 290 mcg by mouth daily before breakfast.    Yes [provider]  Magnesium 250 MG TABS Take 1,000 mg by mouth at bedtime.   Yes [provider]  montelukast (SINGULAIR) 10 MG tablet Take 10 mg by mouth daily.  03/16/18  Yes [provider]  omeprazole (PRILOSEC) 40 MG capsule Take 40 mg by mouth 2 (two) times daily.    Yes [provider]  ondansetron (ZOFRAN) 4 MG tablet Take 1 tablet (4 mg  total) by mouth every 8 (eight) hours as needed for nausea or vomiting. 01/16/17  Yes Jaffe, Adam R, DO  oxyCODONE (OXY IR/ROXICODONE) 5 MG immediate release tablet Take 1 tablet (5 mg total) by mouth every 6 (six) hours as needed for moderate pain, severe pain or breakthrough pain. 09/03/18  Yes Coralie Keens, MD  pravastatin (PRAVACHOL) 10 MG tablet Take 10 mg by mouth every evening. 08/21/18  Yes [provider]  pregabalin (LYRICA) 75 MG capsule Take 75 mg by mouth 2 (two) times daily as needed (NERVE PAIN).   Yes [provider]  rizatriptan (MAXALT-MLT) 10 MG disintegrating tablet TAKE 1 TABLET BY  MOUTH AT EARLIEST ONSET OF HEADACHE, MAY REPEAT ONCE IN 2 HOURS IF NEEDED Patient taking differently: Take 10 mg by mouth as needed for migraine (AT EARLIEST ONSET OF HEADACHE, MAY REPEAT ONCE IN 2 HOURS IF NEEDED).  09/07/18  Yes Jaffe, Adam R, DO  TRULICITY 1.5 ES/9.2ZR SOPN INJECT 1.5MG UNDER THE SKIN ONCE EVERY WEEK ON TUESDAY Patient taking differently: Inject 1.5 mg into the muscle every Tuesday.  08/04/18  Yes Renato Shin, MD  Vitamin D, Ergocalciferol, (DRISDOL) 1.25 MG (50000 UT) CAPS capsule Take 50,000 Units by mouth every 7 (seven) days.   Yes [provider]  BD PEN NEEDLE NANO U/F 32G X 4 MM MISC USE FOR INSULIN THREE TIMES DAILY 11/28/18   Renato Shin, MD  Blood Glucose Calibration (ONETOUCH VERIO) SOLN Used to check meter accuracy 02/26/17   Renato Shin, MD  Blood Glucose Monitoring Suppl (ONETOUCH VERIO) w/Device KIT  03/23/18   [provider]  glucose blood (ONETOUCH VERIO) test strip 1 each by Other route 2 (two) times daily. use for testing 04/22/18   Renato Shin, MD  Lancets (ONETOUCH DELICA PLUS [id number]) [employer] AS DIRECTED 08/23/18   Renato Shin, MD    Family History Family History  Problem Relation Age of Onset  . Hypertension Mother   . Diabetes Mother   . Cancer Mother        rectal  . Heart disease Father 77        MI x5  . Hypertension Father   . Diabetes Father   . Drug abuse Brother   . Alcohol abuse Brother   . Breast cancer Sister        unsure of age , was between 108 and 87  . Drug abuse Brother   . Alcohol abuse Brother   . Anxiety disorder Neg Hx   . Bipolar disorder Neg Hx   . Depression Neg Hx     Social History Social History   Tobacco Use  . Smoking status: Former Smoker    Packs/day: 0.50    Years: 30.00    Pack years: 15.00    Types: Cigarettes    Last attempt to quit: 04/24/2018    Years since quitting: 0.6  . Smokeless tobacco: Never Used  Substance Use Topics  . Alcohol use: No    Comment: rare  . Drug use: No    Comment: cocaine use daily for 3 yrs in her 34's. 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  Today denies all drug abuse     Allergies   Ergotrate [ergonovine]; Ibuprofen; Latex; Metformin and related; and Yutopar [ritodrine]   Review of Systems Review of Systems  All other systems negative except as documented in the HPI. All pertinent positives and negatives as reviewed in the HPI. Physical Exam Updated Vital Signs BP (!) 155/84   Pulse 77   Temp 98 F (36.7 C) (Oral)   Resp 17   LMP 12/06/2016 (Approximate)   SpO2 100%   Physical Exam Vitals signs and nursing note reviewed.  Constitutional:      General: She is not in acute distress.    Appearance: She is well-developed.  HENT:     Head: Normocephalic and atraumatic.     Mouth/Throat:     Mouth: Mucous membranes are dry.  Eyes:     Pupils: Pupils are equal, round, and reactive to light.  Neck:     Musculoskeletal: Normal range of motion  and neck supple.  Cardiovascular:     Rate and Rhythm: Normal rate and regular rhythm.     Heart sounds: Normal heart sounds. No murmur. No friction rub. No gallop.   Pulmonary:     Effort: Pulmonary effort is normal. No respiratory distress.     Breath sounds: Normal breath sounds. No wheezing.  Abdominal:     General: Bowel  sounds are normal. There is no distension.     Palpations: Abdomen is soft.     Tenderness: There is no abdominal tenderness.  Skin:    General: Skin is warm and dry.     Capillary Refill: Capillary refill takes less than 2 seconds.     Findings: No erythema or rash.  Neurological:     Mental Status: She is alert and oriented to person, place, and time.     Motor: No abnormal muscle tone.     Coordination: Coordination normal.  Psychiatric:        Behavior: Behavior normal.      ED Treatments / Results  Labs (all labs ordered are listed, but only abnormal results are displayed) Labs Reviewed  COMPREHENSIVE METABOLIC PANEL - Abnormal; Notable for the following components:      Result Value   Potassium 3.3 (*)    Glucose, Bld 258 (*)    Creatinine, Ser 1.12 (*)    AST 43 (*)    ALT 52 (*)    Alkaline Phosphatase 314 (*)    GFR calc non Af Amer 56 (*)    All other components within normal limits  CBC WITH DIFFERENTIAL/PLATELET - Abnormal; Notable for the following components:   RBC 5.82 (*)    Hemoglobin 15.9 (*)    HCT 48.0 (*)    All other components within normal limits  D-DIMER, QUANTITATIVE (NOT AT [facility]) - Abnormal; Notable for the following components:   D-Dimer, Quant 0.75 (*)    All other components within normal limits  CK  URINALYSIS, ROUTINE W REFLEX MICROSCOPIC    EKG EKG Interpretation  Date/Time:  Sunday November 29 2018 17:16:47 EDT Ventricular Rate:  87 PR Interval:    QRS Duration: 96 QT Interval:  407 QTC Calculation: 490 R Axis:   64 Text Interpretation:  Sinus rhythm Low voltage, precordial leads Borderline prolonged QT interval Confirmed by Quintella Reichert [id number]) on 11/29/2018 5:21:20 PM   Radiology Ct Angio Chest Pe W/cm &/or Wo Cm  Result Date: 11/29/2018 CLINICAL DATA:  Lightheadedness, nausea, emesis EXAM: CT ANGIOGRAPHY CHEST WITH CONTRAST TECHNIQUE: Multidetector CT imaging of the chest was performed using the standard protocol during bolus  administration of intravenous contrast. Multiplanar CT image reconstructions and MIPs were obtained to evaluate the vascular anatomy. CONTRAST:  70m OMNIPAQUE IOHEXOL 350 MG/ML SOLN COMPARISON:  07/25/2014 FINDINGS: Cardiovascular: Satisfactory opacification of the pulmonary arteries to the segmental level. No evidence of pulmonary embolism. Normal heart size. No pericardial effusion. Mediastinum/Nodes: No enlarged mediastinal, hilar, or axillary lymph nodes. Thyroid gland, trachea, and esophagus demonstrate no significant findings. Lungs/Pleura: Lungs are clear. No pleural effusion or pneumothorax. Upper Abdomen: No acute abnormality. Musculoskeletal: No chest wall abnormality. No acute or significant osseous findings. Review of the MIP images confirms the above findings. IMPRESSION: Negative examination for pulmonary embolism. Electronically Signed   By: AEddie CandleM.D.   On: 11/29/2018 21:48    Procedures Procedures (including critical care time)  Medications Ordered in ED Medications  sodium chloride 0.9 % bolus 1,000 mL (0 mLs Intravenous Stopped 11/29/18 1914)  ondansetron [facility]) injection 4 mg (4 mg Intravenous Given 11/29/18 1735)  sodium chloride 0.9 % bolus 1,000 mL (0 mLs Intravenous Stopped 11/29/18 2141)  iohexol (OMNIPAQUE) 350 MG/ML injection 75 mL (75 mLs Intravenous Contrast Given 11/29/18 2107)  acetaminophen (TYLENOL) tablet 1,000 mg (1,000 mg Oral Given 11/29/18 2207)     Initial Impression / Assessment and Plan / ED Course  I have reviewed the triage vital signs and the nursing notes.  Pertinent labs & imaging results that were available during my care of the patient were reviewed by me and considered in my medical decision making (see chart for details).        I feel that the patient most likely had heat exhaustion that led to syncope.  The patient was given 2 L of IV fluid and is feeling improved at this time.  Patient was also given food and drink and was able to tolerate  this.  Have advised patient to increase her fluid intake and rest much possible.  Patient has been otherwise stable here in the emergency department.  Her laboratory testing was reviewed along with her CT scan imaging.  The CT scan imaging did not show any significant abnormalities at this time.  She did show signs of dehydration on her laboratory testing. Final Clinical Impressions(s) / ED Diagnoses   Final diagnoses:  None    ED Discharge Orders    None       Rebeca Allegra 11/29/18 2255    Quintella Reichert, MD 12/03/18 [phone]

## 2018-11-29 NOTE — ED Notes (Signed)
Unable to e-sign d/t equipment malfunction.  D/w pt d/c instructions, follow up and return precautions.  Pt verbalized understanding of all of the above.

## 2018-11-29 NOTE — ED Notes (Signed)
Full linen change completed for pt.

## 2018-11-29 NOTE — ED Notes (Signed)
Patient transported to CT 

## 2018-11-29 NOTE — ED Triage Notes (Signed)
Pt here via ems. [place] was @ park, sat on the ground bc was light headed. Pt vomited once and is nauseated. Pt states she hasnt ate today. A&Ox4

## 2018-11-29 NOTE — Discharge Instructions (Addendum)
Return here as needed. Follow up with your primary doctor. Increase your fluid intake. °

## 2018-11-29 NOTE — ED Notes (Signed)
Pt ambulated to restroom. Pt stated "I only have my dizzy spells when laying down".

## 2018-12-04 ENCOUNTER — Other Ambulatory Visit: Payer: Self-pay | Admitting: Endocrinology

## 2018-12-20 ENCOUNTER — Other Ambulatory Visit: Payer: Self-pay | Admitting: Endocrinology

## 2018-12-28 ENCOUNTER — Other Ambulatory Visit: Payer: Self-pay | Admitting: Endocrinology

## 2018-12-29 ENCOUNTER — Ambulatory Visit: Payer: 59 | Admitting: Endocrinology

## 2018-12-30 ENCOUNTER — Other Ambulatory Visit: Payer: Self-pay

## 2019-01-01 ENCOUNTER — Other Ambulatory Visit: Payer: Self-pay

## 2019-01-01 ENCOUNTER — Encounter: Payer: Self-pay | Admitting: Endocrinology

## 2019-01-01 ENCOUNTER — Ambulatory Visit (INDEPENDENT_AMBULATORY_CARE_PROVIDER_SITE_OTHER): Payer: 59 | Admitting: Endocrinology

## 2019-01-01 VITALS — BP 158/82 | HR 87 | Ht 62.0 in | Wt 207.6 lb

## 2019-01-01 DIAGNOSIS — R110 Nausea: Secondary | ICD-10-CM

## 2019-01-01 DIAGNOSIS — I10 Essential (primary) hypertension: Secondary | ICD-10-CM

## 2019-01-01 DIAGNOSIS — Z794 Long term (current) use of insulin: Secondary | ICD-10-CM | POA: Diagnosis not present

## 2019-01-01 DIAGNOSIS — E119 Type 2 diabetes mellitus without complications: Secondary | ICD-10-CM | POA: Diagnosis not present

## 2019-01-01 LAB — POCT GLYCOSYLATED HEMOGLOBIN (HGB A1C): Hemoglobin A1C: 10.6 % — AB (ref 4.0–5.6)

## 2019-01-01 MED ORDER — SOLIQUA 100-33 UNT-MCG/ML ~~LOC~~ SOPN: 40.0000 [IU] | PEN_INJECTOR | SUBCUTANEOUS | 11 refills | Status: DC

## 2019-01-01 NOTE — Progress Notes (Signed)
Subjective:    Patient ID: Kendra Hall, female    DOB: 01/01/1966, 53 y.o.   MRN: [id number]  HPI Pt returns for f/u of diabetes mellitus:  DM type: 2.  Dx'ed: 5329 Complications: renal insuff.    Therapy: trulicity, victoza, and glimepiride.  GDM: 1984.   DKA: never.   Severe hypoglycemia: once, in 2018.  Pancreatitis: never.  Other: metformin caused dizziness; she takes both trulicity and victoza, and she wants to continue; she took insulin 2012-2018, and declines to resume.  she did not tolerate invokana (vaginitis); renal insuff limits rx options.   Interval history:  no cbg record, but states cbg's vary from 140-250. No recent steroids.  She still seldom misses her meds.  No change in chronic headache.  She wants to continue trulicity for now.   Past Medical History:  Diagnosis Date  . Allergy   . Anemia    history of  . Anxiety   . Asthma   . Depression   . Diabetes mellitus    type 2   . Fibromyalgia   . Gastric ulcer   . GERD (gastroesophageal reflux disease)   . Grave's disease   . Graves disease   . H/O therapeutic radiation   . Migraine   . Multiple thyroid nodules   . Myofacial muscle pain   . Osteoarthritis    Facet hypertrophy with injections  . TIA (transient ischemic attack)    02/2015    Past Surgical History:  Procedure Laterality Date  . AXILLARY LYMPH NODE DISSECTION  2001  . CHOLECYSTECTOMY N/A 09/03/2018   Procedure: LAPAROSCOPIC CHOLECYSTECTOMY;  Surgeon: Coralie Keens, MD;  Location: [facility];  Service: General;  Laterality: N/A;  . HYDRADENITIS EXCISION    . TUBAL LIGATION  1995  . VAGINAL HYSTERECTOMY Left 03/11/2017   Procedure: HYSTERECTOMY VAGINAL W/LEFT PROXIMAL SALPINGECTOMY;  Surgeon: Princess Bruins, MD;  Location: [facility];  Service: Gynecology;  Laterality: Left;  request 7:30am OR time  request 2 hours      Social History   Socioeconomic History  . Marital status: Single    Spouse name: Not on file  . Number of  children: 4  . Years of education: 1  . Highest education level: Not on file  Occupational History  . Occupation: Research scientist (physical sciences): [employer]  . Financial resource strain: Not on file  . Food insecurity    Worry: Not on file    Inability: Not on file  . Transportation needs    Medical: Not on file    Non-medical: Not on file  Tobacco Use  . Smoking status: Former Smoker    Packs/day: 0.50    Years: 30.00    Pack years: 15.00    Types: Cigarettes    Quit date: 04/24/2018    Years since quitting: 0.6  . Smokeless tobacco: Never Used  Substance and Sexual Activity  . Alcohol use: No    Comment: rare  . 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  Today denies all drug abuse  . Sexual activity: Not Currently    Birth control/protection: Surgical  Lifestyle  . Physical activity    Days per week: Not on file    Minutes per session: Not on file  . Stress: Not on file  Relationships  . Social connections    Talks  on phone: Not on file    Gets together: Not on file    Attends religious service: Not on file    Active member of club or organization: Not on file    Attends meetings of clubs or organizations: Not on file    Relationship status: Not on file  . Intimate partner violence    Fear of current or ex partner: Not on file    Emotionally abused: Not on file    Physically abused: Not on file    Forced sexual activity: Not on file  Other Topics Concern  . Not on file  Social History Narrative   Lives alone in [place]. Her daugther occassoinally comes and stay with her. 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  originally from [place],  [place].  Raised by mom and her sister who is 8 yrs older than pt. Pt has several half siblings. No longer on disability. Pt has 4 kids, never married.            Current Outpatient Medications on File Prior to Visit  Medication Sig Dispense Refill  . albuterol (PROAIR HFA) 108 (90 Base) MCG/ACT inhaler Inhale 2 puffs into the lungs every 4 (four) hours as needed for wheezing. 1 Inhaler 2  . albuterol (PROVENTIL) (2.5 MG/3ML) 0.083% nebulizer solution Take 3 mLs by nebulization 4 (four) times daily as needed for wheezing or shortness of breath.   3  . baclofen (LIORESAL) 10 MG tablet TAKE 1 TABLET BY MOUTH THREE TIMES DAILY AS NEEDED FOR MUSCLE SPASMS (Patient taking differently: Take 10 mg by mouth 3 (three) times daily as needed for muscle spasms. ) 270 tablet 0  . BD PEN NEEDLE NANO U/F 32G X 4 MM MISC USE TO INJECT INSULIN THREE TIMES DAILY 100 each 0  . benzonatate (TESSALON) 100 MG capsule Take 1 capsule (100 mg total) by mouth 2 (two) times daily as needed for cough. 20 capsule 0  . Blood Glucose Calibration (ONETOUCH VERIO) SOLN Used to check meter accuracy 1 each 0  . Blood Glucose Monitoring Suppl (ONETOUCH VERIO) w/Device KIT     . cimetidine (TAGAMET) 200 MG tablet Take 200 mg by mouth 2 (two) times daily.    . DULoxetine (CYMBALTA) 30 MG capsule Take 1 capsule (30 mg total) by mouth 2 (two) times daily. 180 capsule 3  . EMGALITY 120 MG/ML SOAJ ADMINISTER 1 ML UNDER THE SKIN EVERY 30 DAYS (Patient taking differently: Inject 1 mL into the skin every 30 (thirty) days. ) 1 mL 11  . hydrOXYzine (VISTARIL) 25 MG capsule Take 25 mg by mouth 3 (three) times daily as needed for itching.    . Lancets (ONETOUCH DELICA PLUS [id number]) MISC USE FOUR TIMES DAILY AS DIRECTED 500 each 0  . levothyroxine (SYNTHROID, LEVOTHROID) 125 MCG tablet Take 125 mcg by mouth daily before breakfast.    . lidocaine (LIDODERM) 5 % UNWRAP AND APPLY 1 PATCH TO SKIN DAILY FOR 15 DAYS. REMOVE AND DISCARD PATCH WITHIN 12 HOURS OR AS DIRECTED (Patient taking differently: Place 1 patch onto the skin daily as needed (pain). ) 15 patch 0  . Lifitegrast (XIIDRA) 5 % SOLN Place 1  drop into both eyes daily as needed (dry eyes).     Marland Kitchen linaclotide (LINZESS) 290 MCG CAPS capsule Take 290 mcg by mouth daily before breakfast.     . Magnesium 250 MG TABS Take 1,000 mg by mouth  at bedtime.    . montelukast (SINGULAIR) 10 MG tablet Take 10 mg by mouth daily.   3  . omeprazole (PRILOSEC) 40 MG capsule Take 40 mg by mouth 2 (two) times daily.     . ondansetron (ZOFRAN) 4 MG tablet Take 1 tablet (4 mg total) by mouth every 8 (eight) hours as needed for nausea or vomiting. 20 tablet 2  . ONETOUCH VERIO test strip USE TO CHECK BLOOD SUGAR TWICE DAILY 150 strip 2  . oxyCODONE (OXY IR/ROXICODONE) 5 MG immediate release tablet Take 1 tablet (5 mg total) by mouth every 6 (six) hours as needed for moderate pain, severe pain or breakthrough pain. 25 tablet 0  . pravastatin (PRAVACHOL) 10 MG tablet Take 10 mg by mouth every evening.    . pregabalin (LYRICA) 75 MG capsule Take 75 mg by mouth 2 (two) times daily as needed (NERVE PAIN).    . rizatriptan (MAXALT-MLT) 10 MG disintegrating tablet TAKE 1 TABLET BY MOUTH AT EARLIEST ONSET OF HEADACHE, MAY REPEAT ONCE IN 2 HOURS IF NEEDED (Patient taking differently: Take 10 mg by mouth as needed for migraine (AT EARLIEST ONSET OF HEADACHE, MAY REPEAT ONCE IN 2 HOURS IF NEEDED). ) 9 tablet 3  . TRULICITY 1.5 ZS/8.2LM SOPN INJECT 1.5 MG UNDER THE SKIN EVERY WEEK ON TUESDAY 2 mL 2  . Vitamin D, Ergocalciferol, (DRISDOL) 1.25 MG (50000 UT) CAPS capsule Take 50,000 Units by mouth every 7 (seven) days.     No current facility-administered medications on file prior to visit.     Allergies  Allergen Reactions  . Ergotrate [Ergonovine] Other (See Comments)    Pt does not ever want  . Ibuprofen Other (See Comments)    Patient has ulcer  . Latex Other (See Comments)    Breaks out  . Metformin And Related Other (See Comments)    Dizziness  . Yutopar [Ritodrine] Other (See Comments)    Pt does not ever want    Family History  Problem Relation Age of  Onset  . Hypertension Mother   . Diabetes Mother   . Cancer Mother        rectal  . Heart disease Father 1       MI x5  . Hypertension Father   . Diabetes Father   . Drug abuse Brother   . Alcohol abuse Brother   . Breast cancer Sister        unsure of age , was between 80 and 86  . Drug abuse Brother   . Alcohol abuse Brother   . Anxiety disorder Neg Hx   . Bipolar disorder Neg Hx   . Depression Neg Hx     BP (!) 158/82 (BP Location: Right Arm, Patient Position: Sitting, Cuff Size: Large)   Pulse 87   Ht _0  (1.575 m)   Wt 207 lb 9.6 oz (94.2 kg)   LMP 12/06/2016 (Approximate)   SpO2 97%   BMI 37.97 kg/m    Review of Systems She denies hypoglycemia.  She has slight nausea    Objective:   Physical Exam VITAL SIGNS:  See vs page GENERAL: no distress Pulses: dorsalis pedis intact bilat.   MSK: no deformity of the feet CV: no leg edema Skin:  no ulcer on the feet.  normal color and temp on the feet. Neuro: sensation is intact to touch on the feet Ext: there is bilateral onychomycosis of the toenails.     Lab Results  Component Value Date  CREATININE 1.12 (H) 11/29/2018   BUN 11 11/29/2018   NA 139 11/29/2018   K 3.3 (L) 11/29/2018   CL 104 11/29/2018   CO2 22 11/29/2018       Assessment & Plan:  HTN: is noted today Insulin-requiring type 2 DM, with renal insuff: she needs increased rx Nausea, mild: we'll follow  Patient Instructions  Your blood pressure is high today.  Please see your primary care provider soon, to have it rechecked.   I have sent a prescription to your pharmacy, to increase soliqua to 40 units each morning.  Please continue the same Trulicity.  However, if your nausea worsens, we may have to stop this.   check your blood sugar once a day.  vary the time of day when you check, between before the 3 meals, and at bedtime.  also check if you have symptoms of your blood sugar being too high or too low.  please keep a record of the  readings and bring it to your next appointment here (or you can bring the meter itself).  You can write it on any piece of paper.  please call us sooner if your blood sugar goes below 70, or if you have a lot of readings over 200.   Please come back for a follow-up appointment in 6 weeks.

## 2019-01-01 NOTE — Patient Instructions (Addendum)
Your blood pressure is high today.  Please see your primary care provider soon, to have it rechecked.   I have sent a prescription to your pharmacy, to increase soliqua to 40 units each morning.  Please continue the same Trulicity.  However, if your nausea worsens, we may have to stop this.   check your blood sugar once a day.  vary the time of day when you check, between before the 3 meals, and at bedtime.  also check if you have symptoms of your blood sugar being too high or too low.  please keep a record of the readings and bring it to your next appointment here (or you can bring the meter itself).  You can write it on any piece of paper.  please call us sooner if your blood sugar goes below 70, or if you have a lot of readings over 200.   Please come back for a follow-up appointment in 6 weeks.

## 2019-01-25 ENCOUNTER — Other Ambulatory Visit: Payer: Self-pay | Admitting: Endocrinology

## 2019-02-12 ENCOUNTER — Other Ambulatory Visit: Payer: Self-pay

## 2019-02-16 ENCOUNTER — Encounter: Payer: Self-pay | Admitting: Endocrinology

## 2019-02-16 ENCOUNTER — Ambulatory Visit (INDEPENDENT_AMBULATORY_CARE_PROVIDER_SITE_OTHER): Payer: 59 | Admitting: Endocrinology

## 2019-02-16 ENCOUNTER — Other Ambulatory Visit: Payer: Self-pay

## 2019-02-16 VITALS — BP 146/78 | HR 88 | Ht 62.0 in | Wt 210.0 lb

## 2019-02-16 DIAGNOSIS — R110 Nausea: Secondary | ICD-10-CM | POA: Diagnosis not present

## 2019-02-16 DIAGNOSIS — Z794 Long term (current) use of insulin: Secondary | ICD-10-CM | POA: Diagnosis not present

## 2019-02-16 DIAGNOSIS — I10 Essential (primary) hypertension: Secondary | ICD-10-CM

## 2019-02-16 DIAGNOSIS — E119 Type 2 diabetes mellitus without complications: Secondary | ICD-10-CM

## 2019-02-16 LAB — POCT GLYCOSYLATED HEMOGLOBIN (HGB A1C): Hemoglobin A1C: 8.8 % — AB (ref 4.0–5.6)

## 2019-02-16 MED ORDER — SOLIQUA 100-33 UNT-MCG/ML ~~LOC~~ SOPN: 50.0000 [IU] | PEN_INJECTOR | SUBCUTANEOUS | 11 refills | Status: DC

## 2019-02-16 NOTE — Progress Notes (Signed)
Subjective:    Patient ID: Kendra Hall, female    DOB: 08-30-1965, 53 y.o.   MRN: [id number]  HPI Pt returns for f/u of diabetes mellitus:  DM type: Insulin-requiring type 2 Dx'ed: 1694 Complications: renal insuff.    Therapy: Soliqua and Trulicity.   GDM: 1984.   DKA: never.   Severe hypoglycemia: once, in 2018.  Pancreatitis: never.  Other: metformin caused dizziness; she takes both trulicity and soliqua, and she wants to continue; she also took insulin 2012-2018.  she did not tolerate invokana (vaginitis); renal insuff limits rx options.   Interval history:  no cbg record, but states cbg's vary from 102-200's. No recent steroids.  She still seldom misses her meds.  No change in chronic headache.  She wants to continue trulicity for now.   Past Medical History:  Diagnosis Date  . Allergy   . Anemia    history of  . Anxiety   . Asthma   . Depression   . Diabetes mellitus    type 2   . Fibromyalgia   . Gastric ulcer   . GERD (gastroesophageal reflux disease)   . Grave's disease   . Graves disease   . H/O therapeutic radiation   . Migraine   . Multiple thyroid nodules   . Myofacial muscle pain   . Osteoarthritis    Facet hypertrophy with injections  . TIA (transient ischemic attack)    02/2015    Past Surgical History:  Procedure Laterality Date  . AXILLARY LYMPH NODE DISSECTION  2001  . CHOLECYSTECTOMY N/A 09/03/2018   Procedure: LAPAROSCOPIC CHOLECYSTECTOMY;  Surgeon: Coralie Keens, MD;  Location: [facility];  Service: General;  Laterality: N/A;  . HYDRADENITIS EXCISION    . TUBAL LIGATION  1995  . VAGINAL HYSTERECTOMY Left 03/11/2017   Procedure: HYSTERECTOMY VAGINAL W/LEFT PROXIMAL SALPINGECTOMY;  Surgeon: Princess Bruins, MD;  Location: [facility];  Service: Gynecology;  Laterality: Left;  request 7:30am OR time  request 2 hours      Social History   Socioeconomic History  . Marital status: Single    Spouse name: Not on file  . Number of children: 4   . Years of education: 40  . Highest education level: Not on file  Occupational History  . Occupation: Research scientist (physical sciences): [employer]  . Financial resource strain: Not on file  . Food insecurity    Worry: Not on file    Inability: Not on file  . Transportation needs    Medical: Not on file    Non-medical: Not on file  Tobacco Use  . Smoking status: Former Smoker    Packs/day: 0.50    Years: 30.00    Pack years: 15.00    Types: Cigarettes    Quit date: 04/24/2018    Years since quitting: 0.8  . Smokeless tobacco: Never Used  Substance and Sexual Activity  . Alcohol use: No    Comment: rare  . 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  Today denies all drug abuse  . Sexual activity: Not Currently    Birth control/protection: Surgical  Lifestyle  . Physical activity    Days per week: Not on file    Minutes per session: Not on file  . Stress: Not on file  Relationships  . Social Herbalist on phone:  Not on file    Gets together: Not on file    Attends religious service: Not on file    Active member of club or organization: Not on file    Attends meetings of clubs or organizations: Not on file    Relationship status: Not on file  . Intimate partner violence    Fear of current or ex partner: Not on file    Emotionally abused: Not on file    Physically abused: Not on file    Forced sexual activity: Not on file  Other Topics Concern  . Not on file  Social History Narrative   Lives alone in [place]. Her daugther occassoinally comes and stay with her. 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  originally from [place],  [place].  Raised by mom and her sister who is 8 yrs older than pt. Pt has several half siblings. No longer on disability. Pt has 4 kids, never married.           Current  Outpatient Medications on File Prior to Visit  Medication Sig Dispense Refill  . albuterol (PROAIR HFA) 108 (90 Base) MCG/ACT inhaler Inhale 2 puffs into the lungs every 4 (four) hours as needed for wheezing. 1 Inhaler 2  . albuterol (PROVENTIL) (2.5 MG/3ML) 0.083% nebulizer solution Take 3 mLs by nebulization 4 (four) times daily as needed for wheezing or shortness of breath.   3  . baclofen (LIORESAL) 10 MG tablet TAKE 1 TABLET BY MOUTH THREE TIMES DAILY AS NEEDED FOR MUSCLE SPASMS (Patient taking differently: Take 10 mg by mouth 3 (three) times daily as needed for muscle spasms. ) 270 tablet 0  . BD PEN NEEDLE NANO U/F 32G X 4 MM MISC USE TO INJECT INSULIN THREE TIMES DAILY 100 each 0  . Blood Glucose Calibration (ONETOUCH VERIO) SOLN Used to check meter accuracy 1 each 0  . Blood Glucose Monitoring Suppl (ONETOUCH VERIO) w/Device KIT     . cimetidine (TAGAMET) 200 MG tablet Take 200 mg by mouth 2 (two) times daily.    . DULoxetine (CYMBALTA) 30 MG capsule Take 1 capsule (30 mg total) by mouth 2 (two) times daily. 180 capsule 3  . EMGALITY 120 MG/ML SOAJ ADMINISTER 1 ML UNDER THE SKIN EVERY 30 DAYS (Patient taking differently: Inject 1 mL into the skin every 30 (thirty) days. ) 1 mL 11  . hydrOXYzine (VISTARIL) 25 MG capsule Take 25 mg by mouth 3 (three) times daily as needed for itching.    . Lancets (ONETOUCH DELICA PLUS [id number]) MISC USE FOUR TIMES DAILY AS DIRECTED 500 each 0  . lidocaine (LIDODERM) 5 % UNWRAP AND APPLY 1 PATCH TO SKIN DAILY FOR 15 DAYS. REMOVE AND DISCARD PATCH WITHIN 12 HOURS OR AS DIRECTED (Patient taking differently: Place 1 patch onto the skin daily as needed (pain). ) 15 patch 0  . Lifitegrast (XIIDRA) 5 % SOLN Place 1 drop into both eyes daily as needed (dry eyes).     Marland Kitchen linaclotide (LINZESS) 290 MCG CAPS capsule Take 290 mcg by mouth daily before breakfast.     . Magnesium 250 MG TABS Take 1,000 mg by mouth at bedtime.    . montelukast (SINGULAIR) 10 MG tablet Take  10 mg by mouth daily.   3  . omeprazole (PRILOSEC) 40 MG capsule Take 40 mg by mouth 2 (two) times daily.     . ondansetron (ZOFRAN) 4 MG tablet  Take 1 tablet (4 mg total) by mouth every 8 (eight) hours as needed for nausea or vomiting. 20 tablet 2  . ONETOUCH VERIO test strip USE TO CHECK BLOOD SUGAR TWICE DAILY 150 strip 2  . pravastatin (PRAVACHOL) 10 MG tablet Take 10 mg by mouth every evening.    . pregabalin (LYRICA) 75 MG capsule Take 75 mg by mouth 2 (two) times daily as needed (NERVE PAIN).    . rizatriptan (MAXALT-MLT) 10 MG disintegrating tablet TAKE 1 TABLET BY MOUTH AT EARLIEST ONSET OF HEADACHE, MAY REPEAT ONCE IN 2 HOURS IF NEEDED (Patient taking differently: Take 10 mg by mouth as needed for migraine (AT EARLIEST ONSET OF HEADACHE, MAY REPEAT ONCE IN 2 HOURS IF NEEDED). ) 9 tablet 3  . TRULICITY 1.5 KX/3.8HW SOPN INJECT 1.5 MG UNDER THE SKIN EVERY WEEK ON TUESDAY 2 mL 2  . Vitamin D, Ergocalciferol, (DRISDOL) 1.25 MG (50000 UT) CAPS capsule Take 50,000 Units by mouth every 7 (seven) days.     No current facility-administered medications on file prior to visit.     Allergies  Allergen Reactions  . Ergotrate [Ergonovine] Other (See Comments)    Pt does not ever want  . Ibuprofen Other (See Comments)    Patient has ulcer  . Latex Other (See Comments)    Breaks out  . Metformin And Related Other (See Comments)    Dizziness  . Yutopar [Ritodrine] Other (See Comments)    Pt does not ever want    Family History  Problem Relation Age of Onset  . Hypertension Mother   . Diabetes Mother   . Cancer Mother        rectal  . Heart disease Father 72       MI x5  . Hypertension Father   . Diabetes Father   . Drug abuse Brother   . Alcohol abuse Brother   . Breast cancer Sister        unsure of age , was between 64 and 39  . Drug abuse Brother   . Alcohol abuse Brother   . Anxiety disorder Neg Hx   . Bipolar disorder Neg Hx   . Depression Neg Hx     BP (!) 146/78 (BP  Location: Left Arm, Patient Position: Sitting, Cuff Size: Large)   Pulse 88   Ht _0  (1.575 m)   Wt 210 lb (95.3 kg)   LMP 12/06/2016 (Approximate)   SpO2 95%   BMI 38.41 kg/m    Review of Systems She denies hypoglycemia. Nausea is mild.    Objective:   Physical Exam VITAL SIGNS:  See vs page GENERAL: no distress Pulses: dorsalis pedis intact bilat.   MSK: no deformity of the feet CV: no leg edema Skin:  no ulcer on the feet.  normal color and temp on the feet. Neuro: sensation is intact to touch on the feet  Lab Results  Component Value Date   CREATININE 1.12 (H) 11/29/2018   BUN 11 11/29/2018   NA 139 11/29/2018   K 3.3 (L) 11/29/2018   CL 104 11/29/2018   CO2 22 11/29/2018     Lab Results  Component Value Date   HGBA1C 8.8 (A) 02/16/2019       Assessment & Plan:  HTN: is noted today Insulin-requiring type 2 DM: she needs increased rx Nausea: poss due to being on 2 GLP meds.  We discussed.  She wants to stay with both for now.    Patient Instructions  Your blood pressure is high today.  Please see your primary care provider soon, to have it rechecked.   I have sent a prescription to your pharmacy, to increase soliqua to 50 units each morning.  Please continue the same Trulicity.  However, if your nausea worsens, we may have to stop this.   check your blood sugar once a day.  vary the time of day when you check, between before the 3 meals, and at bedtime.  also check if you have symptoms of your blood sugar being too high or too low.  please keep a record of the readings and bring it to your next appointment here (or you can bring the meter itself).  You can write it on any piece of paper.  please call us sooner if your blood sugar goes below 70, or if you have a lot of readings over 200.   Please come back for a follow-up appointment in 2 months.

## 2019-02-16 NOTE — Patient Instructions (Addendum)
Your blood pressure is high today.  Please see your primary care provider soon, to have it rechecked.   I have sent a prescription to your pharmacy, to increase soliqua to 50 units each morning.  Please continue the same Trulicity.  However, if your nausea worsens, we may have to stop this.   check your blood sugar once a day.  vary the time of day when you check, between before the 3 meals, and at bedtime.  also check if you have symptoms of your blood sugar being too high or too low.  please keep a record of the readings and bring it to your next appointment here (or you can bring the meter itself).  You can write it on any piece of paper.  please call us sooner if your blood sugar goes below 70, or if you have a lot of readings over 200.   Please come back for a follow-up appointment in 2 months.

## 2019-02-16 NOTE — Progress Notes (Signed)
error 

## 2019-02-20 ENCOUNTER — Other Ambulatory Visit: Payer: Self-pay | Admitting: Endocrinology

## 2019-02-23 ENCOUNTER — Other Ambulatory Visit: Payer: Self-pay | Admitting: Endocrinology

## 2019-03-08 ENCOUNTER — Encounter: Payer: 59 | Admitting: Nutrition

## 2019-03-18 NOTE — Progress Notes (Signed)
Virtual Visit via Video Note The purpose of this virtual visit is to provide medical care while limiting exposure to the novel coronavirus.    Consent was obtained for video visit:  Yes.   Answered questions that patient had about telehealth interaction:  Yes.   I discussed the limitations, risks, security and privacy concerns of performing an evaluation and management service by telemedicine. I also discussed with the patient that there may be a patient responsible charge related to this service. The patient expressed understanding and agreed to proceed.  Pt location: Home Physician Location: office Name of referring provider:  Simona Huh, NP I connected with Kendra Hall at patients initiation/request on 03/22/2019 at  8:30 AM EDT by video enabled telemedicine application and verified that I am speaking with the correct person using two identifiers. Pt MRN:  [id number] Pt DOB:  09-12-1965 Video Participants:  Kendra Hall   History of Present Illness:  Kendra Hall is a 53 year old woman with type 2 diabetes mellitus, hypothyroidism, fibromyalgia and depression who follows up for migraines.  UPDATE: She had an intractable headache in early June, requiring a headache cocktail, but otherwise she has been doing well. Intensity:  severe Duration:  Within an hour Frequency:  3 days a month Frequency of abortive medication:daily Current NSAIDS:none Current analgesics:Hydrocodone most days for fibromyalgia but has been treating headaches with them as well Current triptans:Maxalt MLT 10 mg Current ergotamine:None Current anti-emetic:Zofran 39m Current muscle relaxants:baclofen Current anti-anxiolytic:BuSpar Current sleep aide:None Current Antihypertensive medications:Would not use beta-blocker due to asthma Current Antidepressant medications:Cymbalta, Lyrica Current Anticonvulsant medications:none Current anti-CGRP:Emgality (first dose 10/31) Current  Vitamins/Herbal/Supplements:None Current Antihistamines/Decongestants:None Other therapy:None  For past couple of days, she reports posterior upper neck pain.  No strenuous activity.    Caffeine:No Diet:Drinks Exercise:Notroutine Depression:Yes; Anxiety:Yes Other pain:Neck pain, fibromyalgia Sleep hygiene:Good  HISTORY:  Onset: She has had history of migraines since her 89s but they became frequent in 2018. Location: Varies: back of head, across forehead, temples Quality: Varies: squeezing, sharp Initial Intensity: 10/10 Aura: no Prodrome: no Postdrome: no Associated symptoms: Nausea, photophobia, phonophobia. She denies associated vomiting, visual disturbance, or unilateral numbness or weakness. It is not a new thunderclap headache. Initial Duration: All day (usually lets up in 30 minutes after taking Maxalt, however lately headache returns in 2 hours) Initial Frequency: Every other day Initial Frequency of abortive medication: every other day Triggers:  None Relieving factors: Sleep Activity: aggravates  Past NSAIDS/steroids: ibuprofen, prednisone (unable to take0 Past analgesics: Tylenol, Excedrin Migraine Past abortive triptans: Sumatriptan tablet Past muscle relaxants: no Past anti-emetic: promethazine Past antihypertensive medications: no Past antidepressant medications: sertraline 548mnortriptyline 753ms needed for sleep (cannot take nightly due to extreme dry mouth), Lexapro 70m93mst anticonvulsant medications: topiramate immediate release 100mg65mgnitive problems), Topiramate ER 100 mg (effective) Past vitamins/Herbal/Supplements: no Other past therapies: no  Family history of headache: cousins  MRI and MRA of head from 03/15/15 were personally reviewed and were unremarkable except for minimal punctate white matter foci.  She has spinal stenosis in the neck. CT cervical spine from 01/09/18 showed cervical spondylosis  with degenerative changesgreatest at C4-C7 levels with bilateral neural foraminal stenosis, as well as mild multifactorial C5-6 canal stenosis. She is treated by pain management.  Past Medical History: Past Medical History:  Diagnosis Date  . Allergy   . Anemia    history of  . Anxiety   . Asthma   . Depression   . Diabetes mellitus  type 2   . Fibromyalgia   . Gastric ulcer   . GERD (gastroesophageal reflux disease)   . Grave's disease   . Graves disease   . H/O therapeutic radiation   . Migraine   . Multiple thyroid nodules   . Myofacial muscle pain   . Osteoarthritis    Facet hypertrophy with injections  . TIA (transient ischemic attack)    02/2015    Medications: Outpatient Encounter Medications as of 03/22/2019  Medication Sig Note  . albuterol (PROAIR HFA) 108 (90 Base) MCG/ACT inhaler Inhale 2 puffs into the lungs every 4 (four) hours as needed for wheezing.   Marland Kitchen albuterol (PROVENTIL) (2.5 MG/3ML) 0.083% nebulizer solution Take 3 mLs by nebulization 4 (four) times daily as needed for wheezing or shortness of breath.    . baclofen (LIORESAL) 10 MG tablet TAKE 1 TABLET BY MOUTH THREE TIMES DAILY AS NEEDED FOR MUSCLE SPASMS (Patient taking differently: Take 10 mg by mouth 3 (three) times daily as needed for muscle spasms. )   . BD PEN NEEDLE NANO U/F 32G X 4 MM MISC USE TO INJECT INSULIN THREE TIMES DAILY   . Blood Glucose Calibration (ONETOUCH VERIO) SOLN Used to check meter accuracy   . Blood Glucose Monitoring Suppl (ONETOUCH VERIO) w/Device KIT    . cimetidine (TAGAMET) 200 MG tablet Take 200 mg by mouth 2 (two) times daily.   . DULoxetine (CYMBALTA) 30 MG capsule Take 1 capsule (30 mg total) by mouth 2 (two) times daily.   Marland Kitchen EMGALITY 120 MG/ML SOAJ ADMINISTER 1 ML UNDER THE SKIN EVERY 30 DAYS (Patient taking differently: Inject 1 mL into the skin every 30 (thirty) days. )   . hydrOXYzine (VISTARIL) 25 MG capsule Take 25 mg by mouth 3 (three) times daily as needed for  itching.   . Insulin Glargine-Lixisenatide (SOLIQUA) 100-33 UNT-MCG/ML SOPN Inject 50 Units into the skin every morning.   . Lancets (ONETOUCH DELICA PLUS [id number]) MISC USE FOUR TIMES DAILY AS DIRECTED   . lidocaine (LIDODERM) 5 % UNWRAP AND APPLY 1 PATCH TO SKIN DAILY FOR 15 DAYS. REMOVE AND DISCARD PATCH WITHIN 12 HOURS OR AS DIRECTED (Patient taking differently: Place 1 patch onto the skin daily as needed (pain). )   . Lifitegrast (XIIDRA) 5 % SOLN Place 1 drop into both eyes daily as needed (dry eyes).    Marland Kitchen linaclotide (LINZESS) 290 MCG CAPS capsule Take 290 mcg by mouth daily before breakfast.    . Magnesium 250 MG TABS Take 1,000 mg by mouth at bedtime.   . montelukast (SINGULAIR) 10 MG tablet Take 10 mg by mouth daily.    Marland Kitchen omeprazole (PRILOSEC) 40 MG capsule Take 40 mg by mouth 2 (two) times daily.    . ondansetron (ZOFRAN) 4 MG tablet Take 1 tablet (4 mg total) by mouth every 8 (eight) hours as needed for nausea or vomiting.   Kendra Hall VERIO test strip USE TO CHECK BLOOD SUGAR TWICE DAILY   . pravastatin (PRAVACHOL) 10 MG tablet Take 10 mg by mouth every evening. 11/29/2018: Hasn't been taking, doctor's orders; will be put back on it.  . pregabalin (LYRICA) 75 MG capsule Take 75 mg by mouth 2 (two) times daily as needed (NERVE PAIN).   . rizatriptan (MAXALT-MLT) 10 MG disintegrating tablet TAKE 1 TABLET BY MOUTH AT EARLIEST ONSET OF HEADACHE, MAY REPEAT ONCE IN 2 HOURS IF NEEDED (Patient taking differently: Take 10 mg by mouth as needed for migraine (AT EARLIEST ONSET OF  HEADACHE, MAY REPEAT ONCE IN 2 HOURS IF NEEDED). )   . TRULICITY 1.5 UX/3.2GM SOPN INJECT 1.5 MG UNDER THE SKIN EVERY WEEK ON TUESDAY   . Vitamin D, Ergocalciferol, (DRISDOL) 1.25 MG (50000 UT) CAPS capsule Take 50,000 Units by mouth every 7 (seven) days.    No facility-administered encounter medications on file as of 03/22/2019.     Allergies: Allergies  Allergen Reactions  . Ergotrate [Ergonovine] Other (See  Comments)    Pt does not ever want  . Ibuprofen Other (See Comments)    Patient has ulcer  . Latex Other (See Comments)    Breaks out  . Metformin And Related Other (See Comments)    Dizziness  . Yutopar [Ritodrine] Other (See Comments)    Pt does not ever want    Family History: Family History  Problem Relation Age of Onset  . Hypertension Mother   . Diabetes Mother   . Cancer Mother        rectal  . Heart disease Father 44       MI x5  . Hypertension Father   . Diabetes Father   . Drug abuse Brother   . Alcohol abuse Brother   . Breast cancer Sister        unsure of age , was between 39 and 69  . Drug abuse Brother   . Alcohol abuse Brother   . Anxiety disorder Neg Hx   . Bipolar disorder Neg Hx   . Depression Neg Hx     Social History: Social History   Socioeconomic History  . Marital status: Single    Spouse name: Not on file  . Number of children: 4  . Years of education: 71  . Highest education level: Not on file  Occupational History  . Occupation: Research scientist (physical sciences): [employer]  . Financial resource strain: Not on file  . Food insecurity    Worry: Not on file    Inability: Not on file  . Transportation needs    Medical: Not on file    Non-medical: Not on file  Tobacco Use  . Smoking status: Former Smoker    Packs/day: 0.50    Years: 30.00    Pack years: 15.00    Types: Cigarettes    Quit date: 04/24/2018    Years since quitting: 0.8  . Smokeless tobacco: Never Used  Substance and Sexual Activity  . Alcohol use: No    Comment: rare  . 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  Today denies all drug abuse  . Sexual activity: Not Currently    Birth control/protection: Surgical  Lifestyle  . Physical activity    Days per week: Not on file    Minutes per session: Not on file  . Stress: Not on file  Relationships  . Social Product manager on phone: Not on file    Gets together: Not on file    Attends religious service: Not on file    Active member of club or organization: Not on file    Attends meetings of clubs or organizations: Not on file    Relationship status: Not on file  . Intimate partner violence    Fear of current or ex partner: Not on file    Emotionally abused: Not on file    Physically  abused: Not on file    Forced sexual activity: Not on file  Other Topics Concern  . Not on file  Social History Narrative   Lives alone in [place]. Her daugther occassoinally comes and stay with her. 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  originally from [place],  [place].  Raised by mom and her sister who is 8 yrs older than pt. Pt has several half siblings. No longer on disability. Pt has 4 kids, never married.           Observations/Objective:   Height '5\' 4"'  (1.626 m), weight 210 lb (95.3 kg), last menstrual period 12/06/2016. No acute distress.  Alert and oriented.  Speech fluent and not dysarthric.  Language intact.  Eyes orthophoric on primary gaze.  Face symmetric.  Assessment and Plan:   Migraine without aura, without status migrainosus, not intractable Myofascial neck pain  1.  For preventative management, Emgality 2.  For abortive therapy, Maxalt 68m 3. For neck pain, take baclofen PRN 4.  Limit use of pain relievers to no more than 2 days out of week to prevent risk of rebound or medication-overuse headache. 5.  Keep headache diary 6.  Exercise, hydration, caffeine cessation, sleep hygiene, monitor for and avoid triggers 7.  Consider:  magnesium citrate 4026mdaily, riboflavin 40058maily, and coenzyme Q10 100m22mree times daily 8. Always keep in mind that currently taking a hormone or birth control may be a possible trigger or aggravating factor for migraine. 9. Follow up 6 months.   Follow Up Instructions:    -I  discussed the assessment and treatment plan with the patient. The patient was provided an opportunity to ask questions and all were answered. The patient agreed with the plan and demonstrated an understanding of the instructions.   The patient was advised to call back or seek an in-person evaluation if the symptoms worsen or if the condition fails to improve as anticipated.   AdamDudley Major

## 2019-03-19 ENCOUNTER — Encounter: Payer: Self-pay | Admitting: Neurology

## 2019-03-22 ENCOUNTER — Encounter: Payer: Self-pay | Admitting: Neurology

## 2019-03-22 ENCOUNTER — Other Ambulatory Visit: Payer: Self-pay

## 2019-03-22 ENCOUNTER — Telehealth (INDEPENDENT_AMBULATORY_CARE_PROVIDER_SITE_OTHER): Payer: 59 | Admitting: Neurology

## 2019-03-22 VITALS — Ht 64.0 in | Wt 210.0 lb

## 2019-03-22 DIAGNOSIS — M542 Cervicalgia: Secondary | ICD-10-CM

## 2019-03-22 DIAGNOSIS — G43009 Migraine without aura, not intractable, without status migrainosus: Secondary | ICD-10-CM | POA: Diagnosis not present

## 2019-03-23 ENCOUNTER — Other Ambulatory Visit: Payer: Self-pay | Admitting: Endocrinology

## 2019-03-27 LAB — TSH: TSH: 0.36 — AB (ref 0.41–5.90)

## 2019-03-27 LAB — T3: Triiodothyronine (T3): 113.37

## 2019-04-16 ENCOUNTER — Other Ambulatory Visit: Payer: Self-pay

## 2019-04-20 ENCOUNTER — Ambulatory Visit (INDEPENDENT_AMBULATORY_CARE_PROVIDER_SITE_OTHER): Payer: 59 | Admitting: Endocrinology

## 2019-04-20 ENCOUNTER — Encounter: Payer: Self-pay | Admitting: Endocrinology

## 2019-04-20 ENCOUNTER — Other Ambulatory Visit: Payer: Self-pay

## 2019-04-20 VITALS — BP 146/80 | HR 88 | Ht 64.0 in | Wt 212.8 lb

## 2019-04-20 DIAGNOSIS — R110 Nausea: Secondary | ICD-10-CM

## 2019-04-20 DIAGNOSIS — Z794 Long term (current) use of insulin: Secondary | ICD-10-CM | POA: Diagnosis not present

## 2019-04-20 DIAGNOSIS — E119 Type 2 diabetes mellitus without complications: Secondary | ICD-10-CM

## 2019-04-20 DIAGNOSIS — I10 Essential (primary) hypertension: Secondary | ICD-10-CM | POA: Diagnosis not present

## 2019-04-20 DIAGNOSIS — E1129 Type 2 diabetes mellitus with other diabetic kidney complication: Secondary | ICD-10-CM | POA: Diagnosis not present

## 2019-04-20 LAB — POCT GLYCOSYLATED HEMOGLOBIN (HGB A1C): Hemoglobin A1C: 7.6 % — AB (ref 4.0–5.6)

## 2019-04-20 NOTE — Progress Notes (Signed)
Subjective:    Patient ID: Kendra Hall, female    DOB: [birth year], 53 y.o.   MRN: [id number]  HPI Pt returns for f/u of diabetes mellitus:  DM type: Insulin-requiring type 2 Dx'ed: 0086 Complications: renal insuff.    Therapy: Soliqua and Trulicity.   GDM: 1984.   DKA: never.   Severe hypoglycemia: once, in 2018.  Pancreatitis: never.  Other: metformin caused dizziness; she takes both trulicity and soliqua, and she wants to continue; she also took insulin 2012-2018.  she did not tolerate invokana (vaginitis); renal insuff limits rx options.   Interval history:  no cbg record, but states cbg's vary from 90-210. No recent steroids.  She still never misses her meds.  pt states she feels well in general.   Past Medical History:  Diagnosis Date  . Allergy   . Anemia    history of  . Anxiety   . Asthma   . Depression   . Diabetes mellitus    type 2   . Fibromyalgia   . Gastric ulcer   . GERD (gastroesophageal reflux disease)   . Grave's disease   . Graves disease   . H/O therapeutic radiation   . Migraine   . Multiple thyroid nodules   . Myofacial muscle pain   . Osteoarthritis    Facet hypertrophy with injections  . TIA (transient ischemic attack)    02/2015    Past Surgical History:  Procedure Laterality Date  . AXILLARY LYMPH NODE DISSECTION  2001  . CHOLECYSTECTOMY N/A 09/03/2018   Procedure: LAPAROSCOPIC CHOLECYSTECTOMY;  Surgeon: Coralie Keens, MD;  Location: [facility];  Service: General;  Laterality: N/A;  . HYDRADENITIS EXCISION    . TUBAL LIGATION  1995  . VAGINAL HYSTERECTOMY Left 03/11/2017   Procedure: HYSTERECTOMY VAGINAL W/LEFT PROXIMAL SALPINGECTOMY;  Surgeon: Princess Bruins, MD;  Location: [facility];  Service: Gynecology;  Laterality: Left;  request 7:30am OR time  request 2 hours      Social History   Socioeconomic History  . Marital status: Single    Spouse name: Not on file  . Number of children: 4  . Years of education: 68  . Highest  education level: Not on file  Occupational History  . Occupation: Research scientist (physical sciences): [employer]  . Financial resource strain: Not on file  . Food insecurity    Worry: Not on file    Inability: Not on file  . Transportation needs    Medical: Not on file    Non-medical: Not on file  Tobacco Use  . Smoking status: Former Smoker    Packs/day: 0.50    Years: 30.00    Pack years: 15.00    Types: Cigarettes    Quit date: 04/24/2018    Years since quitting: 0.9  . Smokeless tobacco: Never Used  Substance and Sexual Activity  . Alcohol use: No    Comment: rare  . Drug use: No    Comment: cocaine use daily for 3 yrs in her 62's. 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  Today denies all drug abuse  . Sexual activity: Not Currently    Birth control/protection: Surgical  Lifestyle  . Physical activity    Days per week: Not on file    Minutes per session: Not on file  . Stress: Not on file  Relationships  . Social connections    Talks on phone: Not on file  Gets together: Not on file    Attends religious service: Not on file    Active member of club or organization: Not on file    Attends meetings of clubs or organizations: Not on file    Relationship status: Not on file  . Intimate partner violence    Fear of current or ex partner: Not on file    Emotionally abused: Not on file    Physically abused: Not on file    Forced sexual activity: Not on file  Other Topics Concern  . Not on file  Social History Narrative   Lives alone in [place]. Her daugther occassoinally comes and stay with her. 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  originally from [place],  [place].  Raised by mom and her sister who is 8 yrs older than pt. Pt has several half siblings. No longer on disability. Pt has 4 kids, never married.           Current Outpatient Medications on File Prior  to Visit  Medication Sig Dispense Refill  . albuterol (PROAIR HFA) 108 (90 Base) MCG/ACT inhaler Inhale 2 puffs into the lungs every 4 (four) hours as needed for wheezing. 1 Inhaler 2  . albuterol (PROVENTIL) (2.5 MG/3ML) 0.083% nebulizer solution Take 3 mLs by nebulization 4 (four) times daily as needed for wheezing or shortness of breath.   3  . baclofen (LIORESAL) 10 MG tablet TAKE 1 TABLET BY MOUTH THREE TIMES DAILY AS NEEDED FOR MUSCLE SPASMS (Patient taking differently: Take 10 mg by mouth 3 (three) times daily as needed for muscle spasms. ) 270 tablet 0  . BD PEN NEEDLE NANO 2ND GEN 32G X 4 MM MISC USE TO INJECT INSULIN THREE TIMES DAILY 100 each 0  . Blood Glucose Calibration (ONETOUCH VERIO) SOLN Used to check meter accuracy (Patient taking differently: 1 each by Other route as needed. Use to calibrate meter prn) 1 each 0  . Blood Glucose Monitoring Suppl (ONETOUCH VERIO) w/Device KIT 1 each by Other route 2 (two) times daily.     . cimetidine (TAGAMET) 200 MG tablet Take 200 mg by mouth 2 (two) times daily.    Marland Kitchen EMGALITY 120 MG/ML SOAJ ADMINISTER 1 ML UNDER THE SKIN EVERY 30 DAYS (Patient taking differently: Inject 1 mL into the skin every 30 (thirty) days. ) 1 mL 11  . hydrOXYzine (VISTARIL) 25 MG capsule Take 25 mg by mouth 3 (three) times daily as needed for itching.    . Insulin Glargine-Lixisenatide (SOLIQUA) 100-33 UNT-MCG/ML SOPN Inject 50 Units into the skin every morning. (Patient taking differently: Inject 50 Units into the skin every evening. ) 10 pen 11  . Lancets (ONETOUCH DELICA PLUS [id number]) MISC USE FOUR TIMES DAILY AS DIRECTED (Patient taking differently: 1 each by Other route 2 (two) times daily. ) 500 each 0  . lidocaine (LIDODERM) 5 % UNWRAP AND APPLY 1 PATCH TO SKIN DAILY FOR 15 DAYS. REMOVE AND DISCARD PATCH WITHIN 12 HOURS OR AS DIRECTED (Patient taking differently: Place 1 patch onto the skin daily as needed (pain). ) 15 patch 0  . Lifitegrast (XIIDRA) 5 % SOLN Place  1 drop into both eyes daily as needed (dry eyes).     Marland Kitchen linaclotide (LINZESS) 290 MCG CAPS capsule Take 290 mcg by mouth daily before breakfast.     . Magnesium 250 MG TABS Take 1,000 mg by mouth at bedtime.    Marland Kitchen  montelukast (SINGULAIR) 10 MG tablet Take 10 mg by mouth daily.   3  . omeprazole (PRILOSEC) 40 MG capsule Take 40 mg by mouth 2 (two) times daily.     . ondansetron (ZOFRAN) 4 MG tablet Take 1 tablet (4 mg total) by mouth every 8 (eight) hours as needed for nausea or vomiting. 20 tablet 2  . ONETOUCH VERIO test strip USE TO CHECK BLOOD SUGAR TWICE DAILY (Patient taking differently: 1 each by Other route 2 (two) times daily. ) 150 strip 2  . pravastatin (PRAVACHOL) 10 MG tablet Take 10 mg by mouth every evening.    . pregabalin (LYRICA) 75 MG capsule Take 75 mg by mouth 2 (two) times daily as needed (NERVE PAIN).    . rizatriptan (MAXALT-MLT) 10 MG disintegrating tablet TAKE 1 TABLET BY MOUTH AT EARLIEST ONSET OF HEADACHE, MAY REPEAT ONCE IN 2 HOURS IF NEEDED (Patient taking differently: Take 10 mg by mouth as needed for migraine (AT EARLIEST ONSET OF HEADACHE, MAY REPEAT ONCE IN 2 HOURS IF NEEDED). ) 9 tablet 3  . TRULICITY 1.5 QQ/7.6PP SOPN INJECT 1.5 MG UNDER THE SKIN EVERY WEEK ON TUESDAY 2 mL 2  . Vitamin D, Ergocalciferol, (DRISDOL) 1.25 MG (50000 UT) CAPS capsule Take 50,000 Units by mouth every 7 (seven) days.     No current facility-administered medications on file prior to visit.     Allergies  Allergen Reactions  . Ergotrate [Ergonovine] Other (See Comments)    Pt does not ever want  . Ibuprofen Other (See Comments)    Patient has ulcer  . Latex Other (See Comments)    Breaks out  . Metformin And Related Other (See Comments)    Dizziness  . Yutopar [Ritodrine] Other (See Comments)    Pt does not ever want    Family History  Problem Relation Age of Onset  . Hypertension Mother   . Diabetes Mother   . Cancer Mother        rectal  . Heart disease Father 19        MI x5  . Hypertension Father   . Diabetes Father   . Drug abuse Brother   . Alcohol abuse Brother   . Breast cancer Sister        unsure of age , was between 35 and 31  . Drug abuse Brother   . Alcohol abuse Brother   . Anxiety disorder Neg Hx   . Bipolar disorder Neg Hx   . Depression Neg Hx     BP (!) 146/80 (BP Location: Right Arm, Patient Position: Sitting, Cuff Size: Large)   Pulse 88   Ht '5\' 4"'  (1.626 m)   Wt 212 lb 12.8 oz (96.5 kg)   LMP 12/06/2016 (Approximate)   SpO2 99%   BMI 36.53 kg/m    Review of Systems She denies hypoglycemia.  Nausea is less now.      Objective:   Physical Exam VITAL SIGNS:  See vs page GENERAL: no distress Pulses: dorsalis pedis intact bilat.   MSK: no deformity of the feet CV: no leg edema Skin:  no ulcer on the feet.  normal color and temp on the feet. Neuro: sensation is intact to touch on the feet.    Lab Results  Component Value Date   HGBA1C 7.6 (A) 04/20/2019       Assessment & Plan:  HTN: is noted today Nausea, improved: Please continue the same Trulicity.  Insulin-requiring type 2 DM, with renal insuff:  this is the best control this pt should aim for, given this regimen, which does match insulin to her changing needs throughout the day   Patient Instructions  Your blood pressure is high today.  Please see your primary care provider soon, to have it rechecked.    Please continue the same diabetes medications  check your blood sugar once a day.  vary the time of day when you check, between before the 3 meals, and at bedtime.  also check if you have symptoms of your blood sugar being too high or too low.  please keep a record of the readings and bring it to your next appointment here (or you can bring the meter itself).  You can write it on any piece of paper.  please call us sooner if your blood sugar goes below 70, or if you have a lot of readings over 200.   Please come back for a follow-up appointment in 3 months.

## 2019-04-20 NOTE — Patient Instructions (Addendum)
Your blood pressure is high today.  Please see your primary care provider soon, to have it rechecked Please continue the same diabetes medications check your blood sugar once a day.  vary the time of day when you check, between before the 3 meals, and at bedtime.  also check if you have symptoms of your blood sugar being too high or too low.  please keep a record of the readings and bring it to your next appointment here (or you can bring the meter itself).  You can write it on any piece of paper.  please call us sooner if your blood sugar goes below 70, or if you have a lot of readings over 200.  Please come back for a follow-up appointment in 3 months.  

## 2019-05-03 ENCOUNTER — Telehealth: Payer: Self-pay

## 2019-05-03 NOTE — Telephone Encounter (Signed)
Please advise if you have received any records (possibly sent as a NP referral - thyroid) for this pt from her PCP.

## 2019-05-03 NOTE — Telephone Encounter (Signed)
Patient called Kendra Hall wanting to know if PCP faxed over her Thyroid chart.  04/21/19 should have been faxed. Patient rather Dr. Loanne Drilling take over her care for Thyroids    Please call and Advise patient  Patient also request detailed message if no one answer

## 2019-05-04 NOTE — Telephone Encounter (Signed)
This is not a referral - I would not know if this has been completed or not

## 2019-05-07 ENCOUNTER — Other Ambulatory Visit: Payer: Self-pay | Admitting: Endocrinology

## 2019-05-07 ENCOUNTER — Telehealth: Payer: Self-pay | Admitting: Endocrinology

## 2019-05-07 NOTE — Telephone Encounter (Signed)
Returned pt call. Advised, on 05/03/19, I had asked PCC's to advise me if they had received any records that may have looked like a referral. It appears my question remains unanswered. Rather than delay her concerns any further, I asked pt to call her PCP and ask that they re-fax records to [phone], ATTENTION: Sahar Ryback. Advised I will ensure that her records are given to Dr. Loanne Drilling and that her concerns are addressed. Pt also asked to move up her next appt but not until she is certain we have received her records. Reassured pt that I will call her once records are received AND to reschedule per her request. Verbalized appreciation, acceptance and understanding.

## 2019-05-07 NOTE — Telephone Encounter (Signed)
Patient called to request Dr. Loanne Drilling treat her for thyroid. Patient requests to be called at ph# [phone] re: status of thyroid medical records that PCP was sending to Dr. Loanne Drilling so that Dr. Loanne Drilling can treat patient's thyroid. If patient does not answer phone-please leave a detailed message on voice mail (which say's GG)

## 2019-05-12 LAB — T4, FREE: T4,Free (Direct): 1.49

## 2019-05-12 NOTE — Telephone Encounter (Signed)
Received lab results from [facility]. Results placed on Dr. Cordelia Pen desk for him to review. Awaiting his response re: whether or not pt appt should be moved to a sooner date. Will call pt with Dr. Cordelia Pen advice once he responds.

## 2019-05-12 NOTE — Telephone Encounter (Signed)
Dr. Loanne Drilling reviewed lab results received from [facility]. States he would like the pt to come in sooner to discuss the results in further detail. Routing this message to the front desk for scheduling purposes. Lab results abstracted and now can be found in Epic.

## 2019-05-13 NOTE — Telephone Encounter (Signed)
Patient is scheduled for appointment on 05/24/19 at 4:00 p.m.

## 2019-05-19 ENCOUNTER — Other Ambulatory Visit: Payer: Self-pay

## 2019-05-24 ENCOUNTER — Encounter: Payer: Self-pay | Admitting: Endocrinology

## 2019-05-24 ENCOUNTER — Telehealth: Payer: Self-pay | Admitting: Endocrinology

## 2019-05-24 ENCOUNTER — Ambulatory Visit (INDEPENDENT_AMBULATORY_CARE_PROVIDER_SITE_OTHER): Payer: 59 | Admitting: Endocrinology

## 2019-05-24 VITALS — BP 142/78 | HR 96 | Temp 98.3°F | Ht 64.0 in | Wt 212.4 lb

## 2019-05-24 DIAGNOSIS — Z794 Long term (current) use of insulin: Secondary | ICD-10-CM | POA: Diagnosis not present

## 2019-05-24 DIAGNOSIS — E119 Type 2 diabetes mellitus without complications: Secondary | ICD-10-CM | POA: Diagnosis not present

## 2019-05-24 NOTE — Patient Instructions (Addendum)
Your blood pressure is high today.  Please see your primary care provider soon, to have it rechecked.    Please continue the same diabetes medications.   check your blood sugar once a day.  vary the time of day when you check, between before the 3 meals, and at bedtime.  also check if you have symptoms of your blood sugar being too high or too low.  please keep a record of the readings and bring it to your next appointment here (or you can bring the meter itself).  You can write it on any piece of paper.  please call us sooner if your blood sugar goes below 70, or if you have a lot of readings over 200.   We are obtaining thyroid blood tests from [facility].  We'll let you know.  Please come back for a follow-up appointment in 3 months.

## 2019-05-24 NOTE — Telephone Encounter (Signed)
I need recent thyroid blood tests from [place], thanks.

## 2019-05-24 NOTE — Progress Notes (Signed)
Emgality approved by [employer] through 05/17/2020.

## 2019-05-24 NOTE — Telephone Encounter (Signed)
Attempted to call [facility] and verbally request these labs. Could not reach anyone, so a fax request was sent.

## 2019-05-24 NOTE — Progress Notes (Signed)
Subjective:    Patient ID: Kendra Hall, female    DOB: Jun 08, 1966, 53 y.o.   MRN: [id number]  HPI Pt returns for f/u of diabetes mellitus:  DM type: Insulin-requiring type 2 Dx'ed: 4174 Complications: renal insuff.    Therapy: Soliqua and Trulicity.   GDM: 1984.   DKA: never.   Severe hypoglycemia: once, in 2018.  Pancreatitis: never.  Other: metformin caused dizziness; she takes both trulicity and soliqua, and she wants to continue; she also took insulin 2012-2018.  she did not tolerate invokana (vaginitis); renal insuff limits rx options.   Interval history:  no cbg record, but states cbg's vary from 89-400. No recent steroids.  Pt says she never misses her meds.  pt states she feels well in general.   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  she takes synthroid 137 mcg/d.   Past Medical History:  Diagnosis Date  . Allergy   . Anemia    history of  . Anxiety   . Asthma   . Depression   . Diabetes mellitus    type 2   . Fibromyalgia   . Gastric ulcer   . GERD (gastroesophageal reflux disease)   . Grave's disease   . Graves disease   . H/O therapeutic radiation   . Migraine   . Multiple thyroid nodules   . Myofacial muscle pain   . Osteoarthritis    Facet hypertrophy with injections  . TIA (transient ischemic attack)    02/2015    Past Surgical History:  Procedure Laterality Date  . AXILLARY LYMPH NODE DISSECTION  2001  . CHOLECYSTECTOMY N/A 09/03/2018   Procedure: LAPAROSCOPIC CHOLECYSTECTOMY;  Surgeon: Coralie Keens, MD;  Location: [facility];  Service: General;  Laterality: N/A;  . HYDRADENITIS EXCISION    . TUBAL LIGATION  1995  . VAGINAL HYSTERECTOMY Left 03/11/2017   Procedure: HYSTERECTOMY VAGINAL W/LEFT PROXIMAL SALPINGECTOMY;  Surgeon: Princess Bruins, MD;  Location: [facility];  Service: Gynecology;  Laterality: Left;  request 7:30am OR time  request 2 hours      Social History   Socioeconomic  History  . Marital status: Single    Spouse name: Not on file  . Number of children: 4  . Years of education: 96  . Highest education level: Not on file  Occupational History  . Occupation: Research scientist (physical sciences): [employer]  . Financial resource strain: Not on file  . Food insecurity    Worry: Not on file    Inability: Not on file  . Transportation needs    Medical: Not on file    Non-medical: Not on file  Tobacco Use  . Smoking status: Former Smoker    Packs/day: 0.50    Years: 30.00    Pack years: 15.00    Types: Cigarettes    Quit date: 04/24/2018    Years since quitting: 1.0  . Smokeless tobacco: Never Used  Substance and Sexual Activity  . Alcohol use: No    Comment: rare  . 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  Today denies all drug abuse  . Sexual activity: Not Currently    Birth control/protection: Surgical  Lifestyle  . Physical activity    Days per week: Not on file    Minutes per session: Not on file  .  Stress: Not on file  Relationships  . Social Herbalist on phone: Not on file    Gets together: Not on file    Attends religious service: Not on file    Active member of club or organization: Not on file    Attends meetings of clubs or organizations: Not on file    Relationship status: Not on file  . Intimate partner violence    Fear of current or ex partner: Not on file    Emotionally abused: Not on file    Physically abused: Not on file    Forced sexual activity: Not on file  Other Topics Concern  . Not on file  Social History Narrative   Lives alone in [place]. Her daugther occassoinally comes and stay with her. 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  originally from [place],  [place].  Raised by mom and her sister who is 8 yrs older than pt. Pt has several  half siblings. No longer on disability. Pt has 4 kids, never married.           Current Outpatient Medications on File Prior to Visit  Medication Sig Dispense Refill  . albuterol (PROAIR HFA) 108 (90 Base) MCG/ACT inhaler Inhale 2 puffs into the lungs every 4 (four) hours as needed for wheezing. 1 Inhaler 2  . albuterol (PROVENTIL) (2.5 MG/3ML) 0.083% nebulizer solution Take 3 mLs by nebulization 4 (four) times daily as needed for wheezing or shortness of breath.   3  . baclofen (LIORESAL) 10 MG tablet TAKE 1 TABLET BY MOUTH THREE TIMES DAILY AS NEEDED FOR MUSCLE SPASMS (Patient taking differently: Take 10 mg by mouth 3 (three) times daily as needed for muscle spasms. ) 270 tablet 0  . BD PEN NEEDLE NANO 2ND GEN 32G X 4 MM MISC USE TO INJECT INSULIN THREE TIMES DAILY 100 each 0  . Blood Glucose Calibration (ONETOUCH VERIO) SOLN Used to check meter accuracy (Patient taking differently: 1 each by Other route as needed. Use to calibrate meter prn) 1 each 0  . Blood Glucose Monitoring Suppl (ONETOUCH VERIO) w/Device KIT 1 each by Other route 2 (two) times daily.     . cimetidine (TAGAMET) 200 MG tablet Take 200 mg by mouth 2 (two) times daily.    Marland Kitchen EMGALITY 120 MG/ML SOAJ ADMINISTER 1 ML UNDER THE SKIN EVERY 30 DAYS (Patient taking differently: Inject 1 mL into the skin every 30 (thirty) days. ) 1 mL 11  . hydrOXYzine (VISTARIL) 25 MG capsule Take 25 mg by mouth 3 (three) times daily as needed for itching.    . Insulin Glargine-Lixisenatide (SOLIQUA) 100-33 UNT-MCG/ML SOPN Inject 50 Units into the skin every morning. (Patient taking differently: Inject 50 Units into the skin every evening. ) 10 pen 11  . Lancets (ONETOUCH DELICA PLUS [id number]) MISC USE FOUR TIMES DAILY AS DIRECTED (Patient taking differently: 1 each by Other route 2 (two) times daily. ) 500 each 0  . lidocaine (LIDODERM) 5 % UNWRAP AND APPLY 1 PATCH TO SKIN DAILY FOR 15 DAYS. REMOVE AND DISCARD PATCH WITHIN 12 HOURS OR AS DIRECTED  (Patient taking differently: Place 1 patch onto the skin daily as needed (pain). ) 15 patch 0  . Lifitegrast (XIIDRA) 5 % SOLN Place 1 drop into both eyes daily as needed (dry eyes).     Marland Kitchen linaclotide (LINZESS) 290 MCG CAPS capsule Take 290 mcg by mouth  daily before breakfast.     . Magnesium 250 MG TABS Take 1,000 mg by mouth at bedtime.    . montelukast (SINGULAIR) 10 MG tablet Take 10 mg by mouth daily.   3  . omeprazole (PRILOSEC) 40 MG capsule Take 40 mg by mouth 2 (two) times daily.     . ondansetron (ZOFRAN) 4 MG tablet Take 1 tablet (4 mg total) by mouth every 8 (eight) hours as needed for nausea or vomiting. 20 tablet 2  . ONETOUCH VERIO test strip USE TO CHECK BLOOD SUGAR TWICE DAILY (Patient taking differently: 1 each by Other route 2 (two) times daily. ) 150 strip 2  . pravastatin (PRAVACHOL) 10 MG tablet Take 10 mg by mouth every evening.    . pregabalin (LYRICA) 75 MG capsule Take 75 mg by mouth 2 (two) times daily as needed (NERVE PAIN).    . rizatriptan (MAXALT-MLT) 10 MG disintegrating tablet TAKE 1 TABLET BY MOUTH AT EARLIEST ONSET OF HEADACHE, MAY REPEAT ONCE IN 2 HOURS IF NEEDED (Patient taking differently: Take 10 mg by mouth as needed for migraine (AT EARLIEST ONSET OF HEADACHE, MAY REPEAT ONCE IN 2 HOURS IF NEEDED). ) 9 tablet 3  . TRULICITY 1.5 YQ/6.5HQ SOPN INJECT 1.5 MG UNDER THE SKIN EVERY WEEK ON TUESDAY 2 mL 2  . Vitamin D, Ergocalciferol, (DRISDOL) 1.25 MG (50000 UT) CAPS capsule Take 50,000 Units by mouth every 7 (seven) days.     No current facility-administered medications on file prior to visit.     Allergies  Allergen Reactions  . Ergotrate [Ergonovine] Other (See Comments)    Pt does not ever want  . Ibuprofen Other (See Comments)    Patient has ulcer  . Latex Other (See Comments)    Breaks out  . Metformin And Related Other (See Comments)    Dizziness  . Yutopar [Ritodrine] Other (See Comments)    Pt does not ever want    Family History  Problem  Relation Age of Onset  . Hypertension Mother   . Diabetes Mother   . Cancer Mother        rectal  . Heart disease Father 49       MI x5  . Hypertension Father   . Diabetes Father   . Drug abuse Brother   . Alcohol abuse Brother   . Breast cancer Sister        unsure of age , was between 1 and 65  . Drug abuse Brother   . Alcohol abuse Brother   . Anxiety disorder Neg Hx   . Bipolar disorder Neg Hx   . Depression Neg Hx     BP (!) 142/78 (BP Location: Right Arm, Patient Position: Sitting, Cuff Size: Large)   Pulse 96   Temp 98.3 F (36.8 C)   Ht '5\' 4"'$  (1.626 m)   Wt 212 lb 6.4 oz (96.3 kg)   LMP 12/06/2016 (Approximate)   SpO2 100%   BMI 36.46 kg/m    Review of Systems She denies hypoglycemia.      Objective:   Physical Exam VITAL SIGNS:  See vs page GENERAL: no distress Pulses: dorsalis pedis intact bilat.   MSK: no deformity of the feet CV: no leg edema Skin:  no ulcer on the feet.  normal color and temp on the feet. Neuro: sensation is intact to touch on the feet  Lab Results  Component Value Date   HGBA1C 7.6 (A) 04/20/2019   Lab Results  Component  Value Date   CREATININE 1.12 (H) 11/29/2018   BUN 11 11/29/2018   NA 139 11/29/2018   K 3.3 (L) 11/29/2018   CL 104 11/29/2018   CO2 22 11/29/2018       Assessment & Plan:  Insulin-requiring type 2 DM: this is the best control this pt should aim for, given this regimen, which does match insulin to her changing needs throughout the day HTN: is noted today.  Post-RAI hypothyroidism.    Patient Instructions  Your blood pressure is high today.  Please see your primary care provider soon, to have it rechecked.    Please continue the same diabetes medications.   check your blood sugar once a day.  vary the time of day when you check, between before the 3 meals, and at bedtime.  also check if you have symptoms of your blood sugar being too high or too low.  please keep a record of the readings and bring it  to your next appointment here (or you can bring the meter itself).  You can write it on any piece of paper.  please call us sooner if your blood sugar goes below 70, or if you have a lot of readings over 200.   We are obtaining thyroid blood tests from [facility].  We'll let you know.  Please come back for a follow-up appointment in 3 months.

## 2019-05-26 ENCOUNTER — Telehealth: Payer: Self-pay | Admitting: Endocrinology

## 2019-05-26 ENCOUNTER — Other Ambulatory Visit: Payer: Self-pay

## 2019-05-26 DIAGNOSIS — E119 Type 2 diabetes mellitus without complications: Secondary | ICD-10-CM

## 2019-05-26 DIAGNOSIS — Z794 Long term (current) use of insulin: Secondary | ICD-10-CM

## 2019-05-26 DIAGNOSIS — E890 Postprocedural hypothyroidism: Secondary | ICD-10-CM

## 2019-05-26 MED ORDER — LEVOTHYROXINE SODIUM 125 MCG PO TABS: 125.0000 ug | ORAL_TABLET | Freq: Every day | ORAL | 3 refills | Status: DC

## 2019-05-26 NOTE — Telephone Encounter (Signed)
Ok, please reduce to 125/d.  I'll see you next time.

## 2019-05-26 NOTE — Telephone Encounter (Signed)
please contact patient: I reviewed lab results.  It says you should slightly reduce the thyroid pill.  How long have you been on 137 mcg/d?

## 2019-05-26 NOTE — Telephone Encounter (Signed)
Called pt and informed of Dr. Cordelia Pen new orders. States, after our conversation, she noticed that she would need a refill of her Levothyroxine. Rx has been sent as requested.  levothyroxine (SYNTHROID) 125 MCG tablet 90 tablet 3 05/26/2019    Sig - Route: Take 1 tablet (125 mcg total) by mouth daily. - Oral   Sent to pharmacy as: levothyroxine (SYNTHROID) 125 MCG tablet   E-Prescribing Status: Receipt confirmed by pharmacy (05/26/2019 4:57 PM EST)

## 2019-05-26 NOTE — Addendum Note (Signed)
Addended by: Renato Shin on: 05/26/2019 04:45 PM   Modules accepted: Orders

## 2019-05-26 NOTE — Telephone Encounter (Signed)
Lab results reviewed by Dr. Loanne Drilling. Called pt to inform about lab results and to inquire further about her current dosage. LVM requesting returned call.

## 2019-05-26 NOTE — Telephone Encounter (Signed)
Pt returned call. States she has been on 137 mcg for about 1-2 months. Further states, if changing Rx to 125 mcg, she has enough for 2 months and would not need a new Rx at this time. Please advise

## 2019-06-28 ENCOUNTER — Telehealth: Payer: Self-pay

## 2019-06-28 NOTE — Telephone Encounter (Signed)
PRIOR AUTHORIZATION  PA initiation date: 06/28/19   Insurance Company: [employer] [facility] Submission completed electronically through [employer] My Meds: Yes  Will await insurance response re: approval/denial.  Sundra Aland Key: [id number] - PA Case [id number] - Rx #[id number] Need help? Call us at [phone] Status Sent to [facility] Trulicity 1.5MG /0.5ML pen-injectors Form [employer] Electronic Prior Authorization Form (2017 NCPDP) [id number]

## 2019-07-19 ENCOUNTER — Other Ambulatory Visit: Payer: Self-pay

## 2019-07-19 ENCOUNTER — Telehealth: Payer: Self-pay

## 2019-07-19 NOTE — Telephone Encounter (Signed)
APPROVAL  Received notification from [employer] that PA for Trulicity has been approved 06/28/19 through 06/27/20. Documents have been labeled and placed in scan file for HIM and for our future reference.

## 2019-07-19 NOTE — Telephone Encounter (Signed)
error 

## 2019-07-21 ENCOUNTER — Other Ambulatory Visit: Payer: Self-pay

## 2019-07-21 ENCOUNTER — Ambulatory Visit (INDEPENDENT_AMBULATORY_CARE_PROVIDER_SITE_OTHER): Payer: 59 | Admitting: Endocrinology

## 2019-07-21 ENCOUNTER — Encounter: Payer: Self-pay | Admitting: Endocrinology

## 2019-07-21 VITALS — BP 142/70 | HR 103 | Ht 64.0 in | Wt 211.2 lb

## 2019-07-21 DIAGNOSIS — E119 Type 2 diabetes mellitus without complications: Secondary | ICD-10-CM | POA: Diagnosis not present

## 2019-07-21 DIAGNOSIS — Z794 Long term (current) use of insulin: Secondary | ICD-10-CM | POA: Diagnosis not present

## 2019-07-21 LAB — POCT GLYCOSYLATED HEMOGLOBIN (HGB A1C): Hemoglobin A1C: 9.3 % — AB (ref 4.0–5.6)

## 2019-07-21 MED ORDER — TRULICITY 3 MG/0.5ML ~~LOC~~ SOAJ: 3.0000 mg | SUBCUTANEOUS | 3 refills | Status: DC

## 2019-07-21 NOTE — Progress Notes (Signed)
Subjective:    Patient ID: Kendra Hall, female    DOB: 01-Feb-1966, 54 y.o.   MRN: [id number]  HPI Pt returns for f/u of diabetes mellitus:  DM type: Insulin-requiring type 2 Dx'ed: 8592 Complications: renal insuff.    Therapy: Soliqua and Trulicity.   GDM: 1984.   DKA: never.   Severe hypoglycemia: once, in 2018.  Pancreatitis: never.  Other: metformin caused dizziness; she takes both trulicity and soliqua, and she wants to continue; she also took insulin 2012-2018.  she did not tolerate invokana (vaginitis); renal insuff limits rx options.   Interval history:  no cbg record, but states cbg's vary from 110-230.  No recent steroids.  Pt says she never misses her meds.  pt states she feels well in general.   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   Past Medical History:  Diagnosis Date  . Allergy   . Anemia    history of  . Anxiety   . Asthma   . Depression   . Diabetes mellitus    type 2   . Fibromyalgia   . Gastric ulcer   . GERD (gastroesophageal reflux disease)   . Grave's disease   . Graves disease   . H/O therapeutic radiation   . Migraine   . Multiple thyroid nodules   . Myofacial muscle pain   . Osteoarthritis    Facet hypertrophy with injections  . TIA (transient ischemic attack)    02/2015    Past Surgical History:  Procedure Laterality Date  . AXILLARY LYMPH NODE DISSECTION  2001  . CHOLECYSTECTOMY N/A 09/03/2018   Procedure: LAPAROSCOPIC CHOLECYSTECTOMY;  Surgeon: Coralie Keens, MD;  Location: [facility];  Service: General;  Laterality: N/A;  . HYDRADENITIS EXCISION    . TUBAL LIGATION  1995  . VAGINAL HYSTERECTOMY Left 03/11/2017   Procedure: HYSTERECTOMY VAGINAL W/LEFT PROXIMAL SALPINGECTOMY;  Surgeon: Princess Bruins, MD;  Location: [facility];  Service: Gynecology;  Laterality: Left;  request 7:30am OR time  request 2 hours      Social History   Socioeconomic History  . Marital status:  Single    Spouse name: Not on file  . Number of children: 4  . Years of education: 70  . Highest education level: Not on file  Occupational History  . Occupation: Research scientist (physical sciences): [employer]  Tobacco Use  . Smoking status: Former Smoker    Packs/day: 0.50    Years: 30.00    Pack years: 15.00    Types: Cigarettes    Quit date: 04/24/2018    Years since quitting: 1.2  . Smokeless tobacco: Never Used  Substance and Sexual Activity  . Alcohol use: No    Comment: rare  . Drug use: No    Comment: cocaine use daily for 3 yrs in her 25's. 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  Today denies all drug abuse  . Sexual activity: Not Currently    Birth control/protection: Surgical  Other Topics Concern  . Not on file  Social History Narrative   Lives alone in [place]. Her daugther occassoinally comes and stay with her. 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  originally from [place],  [place].  Raised by mom and her sister who is 8 yrs older than pt. Pt has several half siblings. No longer on disability. Pt has  4 kids, never married.          Social Determinants of Health   Financial Resource Strain:   . Difficulty of Paying Living Expenses: Not on file  Food Insecurity:   . Worried About Charity fundraiser in the Last Year: Not on file  . Ran Out of Food in the Last Year: Not on file  Transportation Needs:   . Lack of Transportation (Medical): Not on file  . Lack of Transportation (Non-Medical): Not on file  Physical Activity:   . Days of Exercise per Week: Not on file  . Minutes of Exercise per Session: Not on file  Stress:   . Feeling of Stress : Not on file  Social Connections:   . Frequency of Communication with Friends and Family: Not on file  . Frequency of Social Gatherings with Friends and Family: Not on file  . Attends Religious Services: Not on file  . Active  Member of Clubs or Organizations: Not on file  . Attends Archivist Meetings: Not on file  . Marital Status: Not on file  Intimate Partner Violence:   . Fear of Current or Ex-Partner: Not on file  . Emotionally Abused: Not on file  . Physically Abused: Not on file  . Sexually Abused: Not on file    Current Outpatient Medications on File Prior to Visit  Medication Sig Dispense Refill  . albuterol (PROAIR HFA) 108 (90 Base) MCG/ACT inhaler Inhale 2 puffs into the lungs every 4 (four) hours as needed for wheezing. 1 Inhaler 2  . albuterol (PROVENTIL) (2.5 MG/3ML) 0.083% nebulizer solution Take 3 mLs by nebulization 4 (four) times daily as needed for wheezing or shortness of breath.   3  . baclofen (LIORESAL) 10 MG tablet TAKE 1 TABLET BY MOUTH THREE TIMES DAILY AS NEEDED FOR MUSCLE SPASMS (Patient taking differently: Take 10 mg by mouth 3 (three) times daily as needed for muscle spasms. ) 270 tablet 0  . BD PEN NEEDLE NANO 2ND GEN 32G X 4 MM MISC USE TO INJECT INSULIN THREE TIMES DAILY 100 each 0  . Blood Glucose Calibration (ONETOUCH VERIO) SOLN Used to check meter accuracy (Patient taking differently: 1 each by Other route as needed. Use to calibrate meter prn) 1 each 0  . Blood Glucose Monitoring Suppl (ONETOUCH VERIO) w/Device KIT 1 each by Other route 2 (two) times daily.     . cimetidine (TAGAMET) 200 MG tablet Take 200 mg by mouth 2 (two) times daily.    Marland Kitchen EMGALITY 120 MG/ML SOAJ ADMINISTER 1 ML UNDER THE SKIN EVERY 30 DAYS (Patient taking differently: Inject 1 mL into the skin every 30 (thirty) days. ) 1 mL 11  . hydrOXYzine (VISTARIL) 25 MG capsule Take 25 mg by mouth 3 (three) times daily as needed for itching.    . Insulin Glargine-Lixisenatide (SOLIQUA) 100-33 UNT-MCG/ML SOPN Inject 50 Units into the skin every morning. (Patient taking differently: Inject 50 Units into the skin daily. ) 10 pen 11  . Lancets (ONETOUCH DELICA PLUS [id number]) MISC USE FOUR TIMES DAILY AS  DIRECTED (Patient taking differently: 1 each by Other route 2 (two) times daily. ) 500 each 0  . levothyroxine (SYNTHROID) 125 MCG tablet Take 1 tablet (125 mcg total) by mouth daily. 90 tablet 3  . lidocaine (LIDODERM) 5 % UNWRAP AND APPLY 1 PATCH TO SKIN DAILY FOR 15 DAYS. REMOVE AND DISCARD PATCH WITHIN 12 HOURS OR AS DIRECTED (Patient taking differently: Place 1 patch onto  the skin daily as needed (pain). ) 15 patch 0  . Lifitegrast (XIIDRA) 5 % SOLN Place 1 drop into both eyes daily as needed (dry eyes).     Marland Kitchen linaclotide (LINZESS) 290 MCG CAPS capsule Take 290 mcg by mouth daily before breakfast.     . Magnesium 250 MG TABS Take 1,000 mg by mouth at bedtime.    . montelukast (SINGULAIR) 10 MG tablet Take 10 mg by mouth daily.   3  . omeprazole (PRILOSEC) 40 MG capsule Take 40 mg by mouth 2 (two) times daily.     . ondansetron (ZOFRAN) 4 MG tablet Take 1 tablet (4 mg total) by mouth every 8 (eight) hours as needed for nausea or vomiting. 20 tablet 2  . pravastatin (PRAVACHOL) 10 MG tablet Take 10 mg by mouth every evening.    . pregabalin (LYRICA) 75 MG capsule Take 75 mg by mouth 2 (two) times daily as needed (NERVE PAIN).    . rizatriptan (MAXALT-MLT) 10 MG disintegrating tablet TAKE 1 TABLET BY MOUTH AT EARLIEST ONSET OF HEADACHE, MAY REPEAT ONCE IN 2 HOURS IF NEEDED (Patient taking differently: Take 10 mg by mouth as needed for migraine (AT EARLIEST ONSET OF HEADACHE, MAY REPEAT ONCE IN 2 HOURS IF NEEDED). ) 9 tablet 3  . Vitamin D, Ergocalciferol, (DRISDOL) 1.25 MG (50000 UT) CAPS capsule Take 50,000 Units by mouth every 7 (seven) days.     No current facility-administered medications on file prior to visit.    Allergies  Allergen Reactions  . Ergotrate [Ergonovine] Other (See Comments)    Pt does not ever want  . Ibuprofen Other (See Comments)    Patient has ulcer  . Latex Other (See Comments)    Breaks out  . Metformin And Related Other (See Comments)    Dizziness  . Yutopar  [Ritodrine] Other (See Comments)    Pt does not ever want    Family History  Problem Relation Age of Onset  . Hypertension Mother   . Diabetes Mother   . Cancer Mother        rectal  . Heart disease Father 57       MI x5  . Hypertension Father   . Diabetes Father   . Drug abuse Brother   . Alcohol abuse Brother   . Breast cancer Sister        unsure of age , was between 34 and 10  . Drug abuse Brother   . Alcohol abuse Brother   . Anxiety disorder Neg Hx   . Bipolar disorder Neg Hx   . Depression Neg Hx     BP (!) 142/70 (BP Location: Right Arm, Patient Position: Sitting, Cuff Size: Large)   Pulse (!) 103   Ht '5\' 4"'  (1.626 m)   Wt 211 lb 3.2 oz (95.8 kg)   LMP 12/06/2016 (Approximate)   SpO2 95%   BMI 36.25 kg/m    Review of Systems She denies hypoglycemia and nausea.     Objective:   Physical Exam VITAL SIGNS:  See vs page GENERAL: no distress Pulses: dorsalis pedis intact bilat.   MSK: no deformity of the feet CV: no leg edema Skin:  no ulcer on the feet.  normal color and temp on the feet. Neuro: sensation is intact to touch on the feet  Lab Results  Component Value Date   HGBA1C 9.3 (A) 07/21/2019        Assessment & Plan:  HTN: is noted today Insulin-requiring  type 2 DM, worse. Renal insuff: she is at risk for fasting hypoglycemia, so we'll increase the trulicity  Patient Instructions  Your blood pressure is high today.  Please see your primary care provider soon, to have it rechecked.   I have sent a prescription to your pharmacy, to increase the Trulicity.   Please continue the same [facility]   check your blood sugar once a day.  vary the time of day when you check, between before the 3 meals, and at bedtime.  also check if you have symptoms of your blood sugar being too high or too low.  please keep a record of the readings and bring it to your next appointment here (or you can bring the meter itself).  You can write it on any piece of paper.   please call us sooner if your blood sugar goes below 70, or if you have a lot of readings over 200.   Please come back for a follow-up appointment in 2 months.

## 2019-07-21 NOTE — Patient Instructions (Addendum)
Your blood pressure is high today.  Please see your primary care provider soon, to have it rechecked.   I have sent a prescription to your pharmacy, to increase the Trulicity.   Please continue the same [facility]   check your blood sugar once a day.  vary the time of day when you check, between before the 3 meals, and at bedtime.  also check if you have symptoms of your blood sugar being too high or too low.  please keep a record of the readings and bring it to your next appointment here (or you can bring the meter itself).  You can write it on any piece of paper.  please call us sooner if your blood sugar goes below 70, or if you have a lot of readings over 200.   Please come back for a follow-up appointment in 2 months.

## 2019-07-22 ENCOUNTER — Other Ambulatory Visit: Payer: Self-pay

## 2019-07-22 DIAGNOSIS — E119 Type 2 diabetes mellitus without complications: Secondary | ICD-10-CM

## 2019-07-22 DIAGNOSIS — Z794 Long term (current) use of insulin: Secondary | ICD-10-CM

## 2019-07-22 MED ORDER — ONETOUCH VERIO VI STRP: 1.0000 | ORAL_STRIP | Freq: Two times a day (BID) | 0 refills | Status: DC

## 2019-07-23 ENCOUNTER — Telehealth: Payer: Self-pay

## 2019-07-23 NOTE — Telephone Encounter (Signed)
PRIOR AUTHORIZATION  PA initiation date: 07/23/19   Insurance Company: [employer] Submission completed electronically through [employer] My Meds: Yes  Will await insurance response re: approval/denial.  Sundra Aland (Key: [id number])  [employer] is reviewing your PA request. Typically an electronic response will be received within 72 hours. To check for an update later, open this request from your dashboard.  You may close this dialog and return to your dashboard to perform other tasks.  Sundra Aland Key: [facility] - PA Case ID: [id number] - Rx #[id number] Need help? Call us at [phone] Status Sent to [employer] [facility] Trulicity 3MG /0.5ML pen-injectors Form [employer] Electronic Prior Authorization Form (2017 NCPDP) [place] [phone]

## 2019-07-23 NOTE — Telephone Encounter (Signed)
Received incoming fax from [place] stating that PA for Trulicity 3mg  has canceled d/t Trulicity 1.5mg  having already been approved. Further adding [id number] approved 06/28/19 through 06/27/20 and if pharmacy has questions or issues processing claim, MUST call help desk @ [phone]. Document has been placed in the faxed file for future reference.

## 2019-08-29 ENCOUNTER — Other Ambulatory Visit: Payer: Self-pay

## 2019-08-29 ENCOUNTER — Encounter (HOSPITAL_COMMUNITY): Payer: Self-pay

## 2019-08-29 ENCOUNTER — Emergency Department (HOSPITAL_COMMUNITY)
Admission: EM | Admit: 2019-08-29 | Discharge: 2019-08-29 | Disposition: A | Discharge status: Home / Self Care | Payer: 59 | Attending: Emergency Medicine | Admitting: Emergency Medicine

## 2019-08-29 DIAGNOSIS — M542 Cervicalgia: Secondary | ICD-10-CM | POA: Diagnosis present

## 2019-08-29 DIAGNOSIS — M79602 Pain in left arm: Secondary | ICD-10-CM

## 2019-08-29 DIAGNOSIS — M62838 Other muscle spasm: Secondary | ICD-10-CM | POA: Insufficient documentation

## 2019-08-29 DIAGNOSIS — M1990 Unspecified osteoarthritis, unspecified site: Secondary | ICD-10-CM | POA: Insufficient documentation

## 2019-08-29 DIAGNOSIS — M797 Fibromyalgia: Secondary | ICD-10-CM | POA: Diagnosis not present

## 2019-08-29 MED ORDER — METHOCARBAMOL 1000 MG/10ML IJ SOLN
1000.0000 mg | Freq: Once | INTRAMUSCULAR | Status: DC
  Filled 2019-08-29: qty 10

## 2019-08-29 MED ORDER — BACLOFEN 10 MG PO TABS: 10.0000 mg | ORAL_TABLET | Freq: Three times a day (TID) | ORAL | 0 refills | Status: DC

## 2019-08-29 MED ORDER — METHOCARBAMOL 500 MG PO TABS
1000.0000 mg | ORAL_TABLET | Freq: Once | ORAL | Status: AC
  Administered 2019-08-29: 1000 mg via ORAL
  Filled 2019-08-29: qty 2

## 2019-08-29 MED ORDER — HYDROCODONE-ACETAMINOPHEN 5-325 MG PO TABS
1.0000 | ORAL_TABLET | Freq: Once | ORAL | Status: AC
  Administered 2019-08-29: 1 via ORAL
  Filled 2019-08-29: qty 1

## 2019-08-29 NOTE — ED Triage Notes (Signed)
Pt reports neck pain over the last few days. Pt reports she went to [facility] on Friday and they were going to give her baclofen but she said she had some at home and then could not find it when she got home. Pt reports the neck pain has progressed and it now feels pressure in her ears.

## 2019-08-29 NOTE — Discharge Instructions (Addendum)
You may alternate taking Tylenol as needed for pain control. You may take [phone] mg of Tylenol every 6 hours. Do not exceed 4000 mg of Tylenol daily as this can lead to liver damage. You may use warm and cold compresses to help with your symptoms.   You were given a prescription for Baclofen  which is a muscle relaxer.  You should not drive, work, or operate machinery while taking this medication as it can make you very drowsy.    Please follow up with your primary care provider within 5-7 days for re-evaluation of your symptoms. If you do not have a primary care provider, information for a healthcare clinic has been provided for you to make arrangements for follow up care. Please return to the emergency department for any new or worsening symptoms.

## 2019-08-29 NOTE — ED Provider Notes (Signed)
[facility] DEPT Provider Note   CSN: [id number] Arrival date & time: 08/29/19  1343     History Chief Complaint  Patient presents with  . Neck Pain    Kendra Hall is a 54 y.o. female.  HPI   Patient is a 54 year old female with a history of anxiety/depression, asthma, diabetes, fibromyalgia, gastric ulcer, GERD, migraines, osteoarthritis, who presents to the ED today for evaluation of neck pain.  Pain has been present for about a week.  It is constant in nature and is located to the bilateral cervical paraspinous muscles.  She denies any injuries or falls.  States that it hurts worse when she tries to turn her head from side to side.  She states that her neck also hurts when she swallows.  She denies any throat pain.  She has tried using heat at home which provides temporary improvement.  She denies any numbness or weakness.  She denies any significant headaches or other neuro complaints.  She was seen at urgent care and was told that she had a muscle spasm.  She states they were going to give her a prescription for baclofen however she declined it stating that she had some at home but she was unable to find this Rx when she got back to her home.  Past Medical History:  Diagnosis Date  . Allergy   . Anemia    history of  . Anxiety   . Asthma   . Depression   . Diabetes mellitus    type 2   . Fibromyalgia   . Gastric ulcer   . GERD (gastroesophageal reflux disease)   . Grave's disease   . Graves disease   . H/O therapeutic radiation   . Migraine   . Multiple thyroid nodules   . Myofacial muscle pain   . Osteoarthritis    Facet hypertrophy with injections  . TIA (transient ischemic attack)    02/2015    Patient Active Problem List   Diagnosis Date Noted  . Lt facial numbness 09/25/2017  . Back pain 06/15/2017  . Postoperative state 03/11/2017  . Trigger point 11/28/2016  . Health care maintenance 02/04/2016  . Severe episode of  recurrent major depressive disorder, with psychotic features ([place]) 08/22/2015  . GAD (generalized anxiety disorder) 08/22/2015  . PTSD (post-traumatic stress disorder) 08/22/2015  . Insomnia 08/22/2015  . GERD (gastroesophageal reflux disease) 04/18/2015  . Fibromyalgia 03/29/2015  . Type 2 diabetes mellitus without complication ([place])   . TIA (transient ischemic attack) 03/15/2015  . Mild intermittent asthma 02/28/2012  . Anxiety disorder 02/07/2012  . Hypertension 01/07/2012  . Hypothyroidism following radioiodine therapy 08/28/2011  . Grave's disease 05/07/2011  . Family hx-breast malignancy 11/20/2010  . Chronic migraine without aura 05/04/2010  . Left shoulder pain 09/08/2009  . TOBACCO USER 03/11/2009  . Notalgia 01/09/2009  . OBESITY, UNSPECIFIED 09/27/2008  . Mood disorder ([place]) 09/22/2008    Past Surgical History:  Procedure Laterality Date  . AXILLARY LYMPH NODE DISSECTION  2001  . CHOLECYSTECTOMY N/A 09/03/2018   Procedure: LAPAROSCOPIC CHOLECYSTECTOMY;  Surgeon: Coralie Keens, MD;  Location: [facility];  Service: General;  Laterality: N/A;  . HYDRADENITIS EXCISION    . TUBAL LIGATION  1995  . VAGINAL HYSTERECTOMY Left 03/11/2017   Procedure: HYSTERECTOMY VAGINAL W/LEFT PROXIMAL SALPINGECTOMY;  Surgeon: Princess Bruins, MD;  Location: [facility];  Service: Gynecology;  Laterality: Left;  request 7:30am OR time  request 2 hours  OB History    Gravida  5   Para      Term      Preterm      AB  2   Living  4     SAB      TAB      Ectopic  0   Multiple      Live Births              Family History  Problem Relation Age of Onset  . Hypertension Mother   . Diabetes Mother   . Cancer Mother        rectal  . Heart disease Father 76       MI x5  . Hypertension Father   . Diabetes Father   . Drug abuse Brother   . Alcohol abuse Brother   . Breast cancer Sister        unsure of age , was between 19 and 82  . Drug abuse Brother   .  Alcohol abuse Brother   . Anxiety disorder Neg Hx   . Bipolar disorder Neg Hx   . Depression Neg Hx     Social History   Tobacco Use  . Smoking status: Former Smoker    Packs/day: 0.50    Years: 30.00    Pack years: 15.00    Types: Cigarettes    Quit date: 04/24/2018    Years since quitting: 1.3  . Smokeless tobacco: Never Used  Substance Use Topics  . Alcohol use: No    Comment: rare  . 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  Today denies all drug abuse    Home Medications Prior to Admission medications   Medication Sig Start Date End Date Taking? Authorizing Provider  albuterol (PROAIR HFA) 108 (90 Base) MCG/ACT inhaler Inhale 2 puffs into the lungs every 4 (four) hours as needed for wheezing. 07/31/17   Nicolette Bang, DO  albuterol (PROVENTIL) (2.5 MG/3ML) 0.083% nebulizer solution Take 3 mLs by nebulization 4 (four) times daily as needed for wheezing or shortness of breath.  03/02/18   [provider]  baclofen (LIORESAL) 10 MG tablet Take 1 tablet (10 mg total) by mouth 3 (three) times daily for 5 days. 08/29/19 09/03/19  Kaylinn Dedic S, PA-C  BD PEN NEEDLE NANO 2ND GEN 32G X 4 MM MISC USE TO INJECT INSULIN THREE TIMES DAILY 03/23/19   Renato Shin, MD  Blood Glucose Calibration (ONETOUCH VERIO) SOLN Used to check meter accuracy Patient taking differently: 1 each by Other route as needed. Use to calibrate meter prn 02/26/17   Renato Shin, MD  Blood Glucose Monitoring Suppl [facility] VERIO) w/Device KIT 1 each by Other route 2 (two) times daily.  03/23/18   [provider]  cimetidine (TAGAMET) 200 MG tablet Take 200 mg by mouth 2 (two) times daily.    [provider]  Dulaglutide (TRULICITY) 3 JG/2.8ZM SOPN Inject 3 mg into the skin once a week. 07/21/19   Renato Shin, MD  EMGALITY 120 MG/ML SOAJ ADMINISTER 1 ML UNDER THE SKIN EVERY 30 DAYS Patient taking  differently: Inject 1 mL into the skin every 30 (thirty) days.  09/07/18   Tomi Likens, Adam R, DO  glucose blood (ONETOUCH VERIO) test strip 1 each by Other route 2 (two) times daily. E11.9 07/22/19   Renato Shin, MD  hydrOXYzine (VISTARIL) 25  MG capsule Take 25 mg by mouth 3 (three) times daily as needed for itching.    [provider]  Insulin Glargine-Lixisenatide (SOLIQUA) 100-33 UNT-MCG/ML SOPN Inject 50 Units into the skin every morning. Patient taking differently: Inject 50 Units into the skin daily.  02/16/19   Renato Shin, MD  Lancets (ONETOUCH DELICA PLUS [id number]) Kulm AS DIRECTED Patient taking differently: 1 each by Other route 2 (two) times daily.  08/23/18   Renato Shin, MD  levothyroxine (SYNTHROID) 125 MCG tablet Take 1 tablet (125 mcg total) by mouth daily. 05/26/19   Renato Shin, MD  lidocaine (LIDODERM) 5 % UNWRAP AND APPLY 1 PATCH TO SKIN DAILY FOR 15 DAYS. REMOVE AND DISCARD PATCH WITHIN 12 HOURS OR AS DIRECTED Patient taking differently: Place 1 patch onto the skin daily as needed (pain).  02/09/18   Sherene Sires, DO  Lifitegrast [place]) 5 % SOLN Place 1 drop into both eyes daily as needed (dry eyes).     [provider]  linaclotide (LINZESS) 290 MCG CAPS capsule Take 290 mcg by mouth daily before breakfast.     [provider]  Magnesium 250 MG TABS Take 1,000 mg by mouth at bedtime.    [provider]  montelukast (SINGULAIR) 10 MG tablet Take 10 mg by mouth daily.  03/16/18   [provider]  omeprazole (PRILOSEC) 40 MG capsule Take 40 mg by mouth 2 (two) times daily.     [provider]  ondansetron (ZOFRAN) 4 MG tablet Take 1 tablet (4 mg total) by mouth every 8 (eight) hours as needed for nausea or vomiting. 01/16/17   Pieter Partridge, DO  pravastatin (PRAVACHOL) 10 MG tablet Take 10 mg by mouth every evening. 08/21/18   [provider]  pregabalin (LYRICA) 75 MG capsule Take 75 mg by mouth 2  (two) times daily as needed (NERVE PAIN).    [provider]  rizatriptan (MAXALT-MLT) 10 MG disintegrating tablet TAKE 1 TABLET BY MOUTH AT EARLIEST ONSET OF HEADACHE, MAY REPEAT ONCE IN 2 HOURS IF NEEDED Patient taking differently: Take 10 mg by mouth as needed for migraine (AT EARLIEST ONSET OF HEADACHE, MAY REPEAT ONCE IN 2 HOURS IF NEEDED).  09/07/18   Pieter Partridge, DO  Vitamin D, Ergocalciferol, (DRISDOL) 1.25 MG (50000 UT) CAPS capsule Take 50,000 Units by mouth every 7 (seven) days.    [provider]    Allergies    Ergotrate [ergonovine], Ibuprofen, Latex, Metformin and related, and Yutopar [ritodrine]  Review of Systems   Review of Systems  Constitutional: Negative for fever.  HENT: Negative for sore throat.   Eyes: Negative for visual disturbance.  Respiratory: Negative for shortness of breath.   Cardiovascular: Negative for chest pain.  Gastrointestinal: Negative for abdominal pain.  Genitourinary: Negative for dysuria and hematuria.  Musculoskeletal: Positive for neck pain. Negative for back pain.  Neurological: Negative for dizziness, weakness, light-headedness, numbness and headaches.  All other systems reviewed and are negative.   Physical Exam Updated Vital Signs BP (!) 161/86 (BP Location: Right Arm)   Pulse 98   Temp 99 F (37.2 C) (Oral)   Resp 18   LMP 12/06/2016 (Approximate)   SpO2 99%   Physical Exam Vitals and nursing note reviewed.  Constitutional:      General: She is not in acute distress.    Appearance: She is well-developed.  HENT:     Head: Normocephalic and atraumatic.  Eyes:  Conjunctiva/sclera: Conjunctivae normal.  Neck:     Comments: Able to touch chin to chest Cardiovascular:     Rate and Rhythm: Normal rate.  Pulmonary:     Effort: Pulmonary effort is normal.  Abdominal:     Palpations: Abdomen is soft.  Musculoskeletal:     Cervical back: Neck supple.     Comments: No midline cervical spine TTP. TTP to  the bilat cervical paraspinous muscles with muscle spasm bilaterally.  Skin:    General: Skin is warm and dry.  Neurological:     Mental Status: She is alert.     ED Results / Procedures / Treatments   Labs (all labs ordered are listed, but only abnormal results are displayed) Labs Reviewed - No data to display  EKG None  Radiology No results found.  Procedures Procedures (including critical care time)  Medications Ordered in ED Medications  HYDROcodone-acetaminophen (NORCO/VICODIN) 5-325 MG per tablet 1 tablet (has no administration in time range)  methocarbamol (ROBAXIN) injection 1,000 mg (has no administration in time range)    ED Course  I have reviewed the triage vital signs and the nursing notes.  Pertinent labs & imaging results that were available during my care of the patient were reviewed by me and considered in my medical decision making (see chart for details).    MDM Rules/Calculators/A&P                      54 year old female presenting for evaluation of neck pain which has been present for several days.  Pain is worse with movement.  Better with heat.  She was told it was a muscle spasm when she was seen in urgent care.  Presents today with continued symptoms she was not able to start the muscle relaxer that was suggested.  Exam is consistent with muscle spasm.  Doubt emergent cause of her symptoms.  She is given pain medications and a muscle relaxer in the ED.  I will discharge her with a muscle relaxer as well.  Advised on RICE protocol and plan for follow-up.  Advised on return precautions.  She voices understanding of plan and reasons to return.  Questions answered.  Patient stable for discharge.  Final Clinical Impression(s) / ED Diagnoses Final diagnoses:  Muscle spasms of neck    Rx / DC Orders ED Discharge Orders         Ordered    baclofen (LIORESAL) 10 MG tablet  3 times daily     08/29/19 [address], Lannis Lichtenwalner S,  PA-C 08/29/19 1430    Blanchie Dessert, MD 08/29/19 1501

## 2019-09-01 ENCOUNTER — Telehealth: Payer: Self-pay

## 2019-09-01 ENCOUNTER — Encounter: Payer: Self-pay | Admitting: Neurology

## 2019-09-01 NOTE — Telephone Encounter (Signed)
That's fine

## 2019-09-01 NOTE — Telephone Encounter (Signed)
Patient wants office visit instead of VV, had been off [place]. Er only gave 15 tablets. Patient had to go to ER thinks muscle pains but she states, "when she turn to the left, she has dizziness." she is scheduled for Friday VV.

## 2019-09-02 NOTE — Telephone Encounter (Signed)
Patient is going to leave time and VV for now

## 2019-09-02 NOTE — Progress Notes (Signed)
Virtual Visit via Video Note The purpose of this virtual visit is to provide medical care while limiting exposure to the novel coronavirus.    Consent was obtained for video visit:  Yes.   Answered questions that patient had about telehealth interaction:  Yes.   I discussed the limitations, risks, security and privacy concerns of performing an evaluation and management service by telemedicine. I also discussed with the patient that there may be a patient responsible charge related to this service. The patient expressed understanding and agreed to proceed.  Pt location: Home Physician Location: office Name of referring provider:  Simona Huh, NP I connected with Kendra Hall at patients initiation/request on 09/03/2019 at  7:50 AM EST by video enabled telemedicine application and verified that I am speaking with the correct person using two identifiers. Pt MRN:  [id number] Pt DOB:  18-Jul-1965 Video Participants:  Kendra Hall;   History of Present Illness:  Kendra Hall is a 54 year old woman with type 2 diabetes mellitus, hypothyroidism, fibromyalgia and depression who follows up for migraines and muscle spasms.Marland Kitchen  UPDATE: She was supposed to have an in-office visit but needed to switch due to another appointment. Migraines: She is doing very well on Emgality Intensity:  severe Duration:  Within an hour Frequency:  1 day in last month Frequency of abortive medication:daily Current NSAIDS:none Current analgesics:Hydrocodone most days for fibromyalgia but has been treating headaches with them as well Current triptans:Maxalt MLT 10 mg Current ergotamine:None Current anti-emetic:Zofran 69m Current muscle relaxants:baclofen Current anti-anxiolytic:BuSpar Current sleep aide:None Current Antihypertensive medications:Would not use beta-blocker due to asthma Current Antidepressant medications:Cymbalta, Lyrica Current Anticonvulsant medications:none Current  anti-CGRP:Emgality (first dose 10/31) Current Vitamins/Herbal/Supplements:None Current Antihistamines/Decongestants:None Other therapy:None  She reports dizzy spells since last week, when she moves her head from left to right or if laying on her bed.  It is a feeling that the bed or room that lasts a couple of seconds.  It does not occur while standing.  It happens throughout the day everyday.  Last week, she had associated nausea but not this week.  No double vision, vomiting, or slurred speech.  She reports prior history of similar vertigo last year and a couple of years ago.  In September, she endorsed non-radiating bilateral posterior upper neck pain.  I prescribed her baclofen.  She ran out of baclofen.  She was seen in Urgent Care on 08/29/2019 who diagnosed muscle spasms.  She was treated with hydrocodone and methocarbamol.  She was discharged on limited baclofen.  Caffeine:No Diet:Drinks Exercise:Notroutine Depression:Yes; Anxiety:Yes Other pain:Neck pain, fibromyalgia Sleep hygiene:Good  HISTORY: Onset: She has had history of migraines since her 89s but theybecame frequent in 2018. Location: Varies: back of head, across forehead, temples Quality: Varies: squeezing, sharp Initial Intensity: 10/10 Aura: no Prodrome: no Postdrome: no Associated symptoms: Nausea, photophobia, phonophobia. She denies associated vomiting, visual disturbance, or unilateral numbness or weakness. It is not a new thunderclap headache. Initial Duration: All day (usually lets up in 30 minutes after taking Maxalt, however lately headache returns in 2 hours) Initial Frequency: Every other day Initial Frequency of abortive medication: every other day Triggers:  None Relieving factors: Sleep Activity: aggravates  Past NSAIDS/steroids: ibuprofen, prednisone (unable to take0 Past analgesics: Tylenol, Excedrin Migraine Past abortive triptans: Sumatriptan tablet Past muscle  relaxants: no Past anti-emetic: promethazine Past antihypertensive medications: no Past antidepressant medications: sertraline 533mnortriptyline 7572ms needed for sleep (cannot take nightly due to extreme dry mouth), Lexapro 19m12mst anticonvulsant  medications: topiramate immediate release 184m (cognitive problems),Topiramate ER 100 mg (effective) Past vitamins/Herbal/Supplements: no Other past therapies: no  Family history of headache: cousins  MRI and MRA of head from 03/15/15 were personally reviewed and were unremarkable except for minimal punctate white matter foci.  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 CT cervical spine from 01/09/18 showed cervical spondylosis with degenerative changesgreatest at C4-C7 levels with bilateral neural foraminal stenosis, as well as mild multifactorial C5-6 canal stenosis. She is treated by pain management.  Past Medical History: Past Medical History:  Diagnosis Date  . Allergy   . Anemia    history of  . Anxiety   . Asthma   . Depression   . Diabetes mellitus    type 2   . Fibromyalgia   . Gastric ulcer   . GERD (gastroesophageal reflux disease)   . Grave's disease   . Graves disease   . H/O therapeutic radiation   . Migraine   . Multiple thyroid nodules   . Myofacial muscle pain   . Osteoarthritis    Facet hypertrophy with injections  . TIA (transient ischemic attack)    02/2015    Medications: Outpatient Encounter Medications as of 09/03/2019  Medication Sig Note  . albuterol (PROAIR HFA) 108 (90 Base) MCG/ACT inhaler Inhale 2 puffs into the lungs every 4 (four) hours as needed for wheezing.   .Marland Kitchenalbuterol (PROVENTIL) (2.5 MG/3ML) 0.083% nebulizer solution Take 3 mLs by nebulization 4 (four) times daily as needed for wheezing or shortness of breath.    . baclofen (LIORESAL) 10 MG tablet Take 1 tablet (10 mg total) by mouth 3 (three) times daily for 5 days.   . BD PEN  NEEDLE NANO 2ND GEN 32G X 4 MM MISC USE TO INJECT INSULIN THREE TIMES DAILY   . Blood Glucose Calibration (ONETOUCH VERIO) SOLN Used to check meter accuracy (Patient taking differently: 1 each by Other route as needed. Use to calibrate meter prn)   . Blood Glucose Monitoring Suppl (ONETOUCH VERIO) w/Device KIT 1 each by Other route 2 (two) times daily.    . cimetidine (TAGAMET) 200 MG tablet Take 200 mg by mouth 2 (two) times daily.   . Dulaglutide (TRULICITY) 3 MJE/5.6DJSOPN Inject 3 mg into the skin once a week.   .Marland KitchenEMGALITY 120 MG/ML SOAJ ADMINISTER 1 ML UNDER THE SKIN EVERY 30 DAYS (Patient taking differently: Inject 1 mL into the skin every 30 (thirty) days. )   . glucose blood (ONETOUCH VERIO) test strip 1 each by Other route 2 (two) times daily. E11.9   . hydrOXYzine (VISTARIL) 25 MG capsule Take 25 mg by mouth 3 (three) times daily as needed for itching.   . Insulin Glargine-Lixisenatide (SOLIQUA) 100-33 UNT-MCG/ML SOPN Inject 50 Units into the skin every morning. (Patient taking differently: Inject 50 Units into the skin daily. )   . Lancets (ONETOUCH DELICA PLUS [id number] MISC USE FOUR TIMES DAILY AS DIRECTED (Patient taking differently: 1 each by Other route 2 (two) times daily. )   . levothyroxine (SYNTHROID) 125 MCG tablet Take 1 tablet (125 mcg total) by mouth daily.   .Marland Kitchenlidocaine (LIDODERM) 5 % UNWRAP AND APPLY 1 PATCH TO SKIN DAILY FOR 15 DAYS. REMOVE AND DISCARD PATCH WITHIN 12 HOURS OR AS DIRECTED (Patient taking differently: Place 1 patch onto the skin daily as needed (pain). )   . Lifitegrast (XIIDRA) 5 % SOLN Place 1 drop into both eyes daily as needed (  dry eyes).    Marland Kitchen linaclotide (LINZESS) 290 MCG CAPS capsule Take 290 mcg by mouth daily before breakfast.    . Magnesium 250 MG TABS Take 1,000 mg by mouth at bedtime.   . montelukast (SINGULAIR) 10 MG tablet Take 10 mg by mouth daily.    Marland Kitchen omeprazole (PRILOSEC) 40 MG capsule Take 40 mg by mouth 2 (two) times daily.    .  ondansetron (ZOFRAN) 4 MG tablet Take 1 tablet (4 mg total) by mouth every 8 (eight) hours as needed for nausea or vomiting.   . pregabalin (LYRICA) 75 MG capsule Take 100 mg by mouth 2 (two) times daily. Only takes at night   . rizatriptan (MAXALT-MLT) 10 MG disintegrating tablet TAKE 1 TABLET BY MOUTH AT EARLIEST ONSET OF HEADACHE, MAY REPEAT ONCE IN 2 HOURS IF NEEDED (Patient taking differently: Take 10 mg by mouth as needed for migraine (AT EARLIEST ONSET OF HEADACHE, MAY REPEAT ONCE IN 2 HOURS IF NEEDED). )   . Vitamin D, Ergocalciferol, (DRISDOL) 1.25 MG (50000 UT) CAPS capsule Take 50,000 Units by mouth every 7 (seven) days.   Marland Kitchen amLODipine (NORVASC) 2.5 MG tablet Take 2.5 mg by mouth daily.   . DULoxetine (CYMBALTA) 60 MG capsule Take 60 mg by mouth 2 (two) times daily.   . pravastatin (PRAVACHOL) 10 MG tablet Take 10 mg by mouth every evening. 11/29/2018: Hasn't been taking, doctor's orders; will be put back on it.   No facility-administered encounter medications on file as of 09/03/2019.    Allergies: Allergies  Allergen Reactions  . Ergotrate [Ergonovine] Other (See Comments)    Pt does not ever want  . Ibuprofen Other (See Comments)    Patient has ulcer  . Latex Other (See Comments)    Breaks out  . Metformin And Related Other (See Comments)    Dizziness  . Yutopar [Ritodrine] Other (See Comments)    Pt does not ever want    Family History: Family History  Problem Relation Age of Onset  . Hypertension Mother   . Diabetes Mother   . Cancer Mother        rectal  . Heart disease Father 7       MI x5  . Hypertension Father   . Diabetes Father   . Drug abuse Brother   . Alcohol abuse Brother   . Breast cancer Sister        unsure of age , was between 4 and 6  . Drug abuse Brother   . Alcohol abuse Brother   . Anxiety disorder Neg Hx   . Bipolar disorder Neg Hx   . Depression Neg Hx     Social History: Social History   Socioeconomic History  . Marital status:  Single    Spouse name: Not on file  . Number of children: 4  . Years of education: 88  . Highest education level: Not on file  Occupational History  . Occupation: Research scientist (physical sciences): [employer]  Tobacco Use  . Smoking status: Former Smoker    Packs/day: 0.50    Years: 30.00    Pack years: 15.00    Types: Cigarettes    Quit date: 04/24/2018    Years since quitting: 1.3  . Smokeless tobacco: Never Used  Substance and Sexual Activity  . Alcohol use: No    Comment: rare  . 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  Today denies all drug abuse  . Sexual activity: Not Currently    Birth control/protection: Surgical  Other Topics Concern  . Not on file  Social History Narrative   Lives alone in [place]. Her daugther occassoinally comes and stay with her. 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  originally from [place],  [place].  Raised by mom and her sister who is 8 yrs older than pt. Pt has several half siblings. No longer on disability. Pt has 4 kids, never married.          Social Determinants of Health   Financial Resource Strain:   . Difficulty of Paying Living Expenses:   Food Insecurity:   . Worried About Charity fundraiser in the Last Year:   . Arboriculturist in the Last Year:   Transportation Needs:   . Film/video editor (Medical):   Marland Kitchen Lack of Transportation (Non-Medical):   Physical Activity:   . Days of Exercise per Week:   . Minutes of Exercise per Session:   Stress:   . Feeling of Stress :   Social Connections:   . Frequency of Communication with Friends and Family:   . Frequency of Social Gatherings with Friends and Family:   . Attends Religious Services:   . Active Member of Clubs or Organizations:   . Attends Archivist Meetings:   Marland Kitchen Marital Status:   Intimate Partner  Violence:   . Fear of Current or Ex-Partner:   . Emotionally Abused:   Marland Kitchen Physically Abused:   . Sexually Abused:     Observations/Objective:   Height '5\' 3"'  (1.6 m), weight 209 lb (94.8 kg), last menstrual period 12/06/2016. No acute distress.  Alert and oriented.  Speech fluent and not dysarthric.  Language intact.  Eyes orthophoric on primary gaze.  Face symmetric.  Assessment and Plan:   1.  Migraine without aura, without status migrainosus, not intractable 2.  Myofascial neck pain/history of chronic neck pain with cervical spinal and neural foraminal stenosis. 3.  Benign paroxysmal positional vertigo  1.  Refill baclofen 31m twice daily. 2.  Emgality for migraine preventative 3.  Maxalt for migraine rescue 4.  Limit use of pain relievers to no more than 2 days out of week to prevent risk of rebound or medication-overuse headache. 5.  Keep headache diary 6.  If vertigo does not improve, we can refer to vestibular rehab. 7.  Follow up in 6 months.  Follow Up Instructions:    -I discussed the assessment and treatment plan with the patient. The patient was provided an opportunity to ask questions and all were answered. The patient agreed with the plan and demonstrated an understanding of the instructions.   The patient was advised to call back or seek an in-person evaluation if the symptoms worsen or if the condition fails to improve as anticipated.    ADudley Major DO

## 2019-09-03 ENCOUNTER — Telehealth (INDEPENDENT_AMBULATORY_CARE_PROVIDER_SITE_OTHER): Payer: 59 | Admitting: Neurology

## 2019-09-03 ENCOUNTER — Other Ambulatory Visit: Payer: Self-pay

## 2019-09-03 VITALS — Ht 63.0 in | Wt 209.0 lb

## 2019-09-03 DIAGNOSIS — G43009 Migraine without aura, not intractable, without status migrainosus: Secondary | ICD-10-CM | POA: Diagnosis not present

## 2019-09-03 DIAGNOSIS — M4802 Spinal stenosis, cervical region: Secondary | ICD-10-CM

## 2019-09-03 DIAGNOSIS — G8929 Other chronic pain: Secondary | ICD-10-CM

## 2019-09-03 DIAGNOSIS — M542 Cervicalgia: Secondary | ICD-10-CM | POA: Diagnosis not present

## 2019-09-03 DIAGNOSIS — H8110 Benign paroxysmal vertigo, unspecified ear: Secondary | ICD-10-CM

## 2019-09-03 MED ORDER — BACLOFEN 10 MG PO TABS: 10.0000 mg | ORAL_TABLET | Freq: Two times a day (BID) | ORAL | 5 refills | Status: DC

## 2019-09-10 ENCOUNTER — Other Ambulatory Visit: Payer: Self-pay

## 2019-09-14 ENCOUNTER — Telehealth: Payer: Self-pay

## 2019-09-14 ENCOUNTER — Ambulatory Visit (INDEPENDENT_AMBULATORY_CARE_PROVIDER_SITE_OTHER): Payer: 59 | Admitting: Endocrinology

## 2019-09-14 ENCOUNTER — Encounter: Payer: Self-pay | Admitting: Endocrinology

## 2019-09-14 ENCOUNTER — Other Ambulatory Visit: Payer: Self-pay

## 2019-09-14 ENCOUNTER — Other Ambulatory Visit: Payer: Self-pay | Admitting: Endocrinology

## 2019-09-14 VITALS — BP 144/76 | HR 99 | Ht 63.0 in | Wt 209.0 lb

## 2019-09-14 DIAGNOSIS — E119 Type 2 diabetes mellitus without complications: Secondary | ICD-10-CM

## 2019-09-14 DIAGNOSIS — Z794 Long term (current) use of insulin: Secondary | ICD-10-CM | POA: Diagnosis not present

## 2019-09-14 LAB — T4, FREE: Free T4: 0.57 ng/dL — ABNORMAL LOW (ref 0.60–1.60)

## 2019-09-14 LAB — POCT GLYCOSYLATED HEMOGLOBIN (HGB A1C): Hemoglobin A1C: 9.4 % — AB (ref 4.0–5.6)

## 2019-09-14 LAB — TSH: TSH: 19.54 u[IU]/mL — ABNORMAL HIGH (ref 0.35–4.50)

## 2019-09-14 MED ORDER — LEVOTHYROXINE SODIUM 150 MCG PO TABS: 150.0000 ug | ORAL_TABLET | Freq: Every day | ORAL | 3 refills | Status: DC

## 2019-09-14 MED ORDER — TRULICITY 4.5 MG/0.5ML ~~LOC~~ SOAJ: 4.5000 mg | SUBCUTANEOUS | 3 refills | Status: DC

## 2019-09-14 NOTE — Patient Instructions (Addendum)
Your blood pressure is high today.  Please see your primary care provider soon, to have it rechecked.   I have sent a prescription to your pharmacy, to increase the Trulicity again.   Please continue the same [facility].   Thyroid blood tests are requested for you today.  We'll let you know about the results.  check your blood sugar once a day.  vary the time of day when you check, between before the 3 meals, and at bedtime.  also check if you have symptoms of your blood sugar being too high or too low.  please keep a record of the readings and bring it to your next appointment here (or you can bring the meter itself).  You can write it on any piece of paper.  please call us sooner if your blood sugar goes below 70, or if you have a lot of readings over 200.   Please come back for a follow-up appointment in 2 months.

## 2019-09-14 NOTE — Telephone Encounter (Signed)
PRIOR AUTHORIZATION  PA initiation date: 09/14/19  Medication: Trulicity 4.5mg /0.62mL Insurance Company: [facility] Submission completed electronically through [employer] My Meds: Yes  Will await insurance response re: approval/denial.  Kendra Hall (Key: [id number])  Your information has been sent to [facility]. Kendra Hall (Key: [id number])  [facility] is reviewing your PA request. Typically an electronic response will be received within 72 hours. To check for an update later, open this request from your dashboard.  You may close this dialog and return to your dashboard to perform other tasks.  Kendra Hall Key: [id number] - PA Case ID: [id number] - Rx #[id number] Need help? Call us at [phone] Status Sent to [facility] Trulicity 4.5MG /0.5ML pen-injectors Form [facility] Electronic Prior Authorization Form (2017 NCPDP) [place] [phone]

## 2019-09-14 NOTE — Addendum Note (Signed)
Addended by: Kaylyn Lim I on: 09/14/2019 08:28 AM   Modules accepted: Orders

## 2019-09-14 NOTE — Progress Notes (Signed)
Subjective:    Patient ID: Kendra Hall, female    DOB: 11/17/1965, 54 y.o.   MRN: [id number]  HPI Pt returns for f/u of diabetes mellitus:  DM type: Insulin-requiring type 2 Dx'ed: 9892 Complications: renal insuff.    Therapy: Soliqua and Trulicity.   GDM: 1984.   DKA: never.   Severe hypoglycemia: once, in 2018.  Pancreatitis: never.  Other: metformin caused dizziness; she takes both trulicity and soliqua, and she wants to continue; she also took insulin 2012-2018.  she did not tolerate invokana (vaginitis); renal insuff limits rx options.   Interval history: no cbg record  No recent steroids.  Pt says she sometimes misses her meds.  pt states she feels well in general.   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  Past Medical History:  Diagnosis Date  . Allergy   . Anemia    history of  . Anxiety   . Asthma   . Depression   . Diabetes mellitus    type 2   . Fibromyalgia   . Gastric ulcer   . GERD (gastroesophageal reflux disease)   . Grave's disease   . Graves disease   . H/O therapeutic radiation   . Migraine   . Multiple thyroid nodules   . Myofacial muscle pain   . Osteoarthritis    Facet hypertrophy with injections  . TIA (transient ischemic attack)    02/2015    Past Surgical History:  Procedure Laterality Date  . AXILLARY LYMPH NODE DISSECTION  2001  . CHOLECYSTECTOMY N/A 09/03/2018   Procedure: LAPAROSCOPIC CHOLECYSTECTOMY;  Surgeon: Coralie Keens, MD;  Location: [facility];  Service: General;  Laterality: N/A;  . HYDRADENITIS EXCISION    . TUBAL LIGATION  1995  . VAGINAL HYSTERECTOMY Left 03/11/2017   Procedure: HYSTERECTOMY VAGINAL W/LEFT PROXIMAL SALPINGECTOMY;  Surgeon: Princess Bruins, MD;  Location: [facility];  Service: Gynecology;  Laterality: Left;  request 7:30am OR time  request 2 hours      Social History   Socioeconomic History  . Marital status: Single    Spouse name: Not on file   . Number of children: 4  . Years of education: 22  . Highest education level: Not on file  Occupational History  . Occupation: Research scientist (physical sciences): [employer]  Tobacco Use  . Smoking status: Former Smoker    Packs/day: 0.50    Years: 30.00    Pack years: 15.00    Types: Cigarettes    Quit date: 04/24/2018    Years since quitting: 1.3  . Smokeless tobacco: Never Used  Substance and Sexual Activity  . Alcohol use: No    Comment: rare  . 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  Today denies all drug abuse  . Sexual activity: Not Currently    Birth control/protection: Surgical  Other Topics Concern  . Not on file  Social History Narrative   Lives alone in [place]. Her daugther occassoinally comes and stay with her. 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  originally from [place],  [place].  Raised by mom and her sister who is 8 yrs older than pt. Pt has several half siblings. No longer on disability. Pt has 4 kids, never married.  Social Determinants of Health   Financial Resource Strain:   . Difficulty of Paying Living Expenses:   Food Insecurity:   . Worried About Charity fundraiser in the Last Year:   . Arboriculturist in the Last Year:   Transportation Needs:   . Film/video editor (Medical):   Marland Kitchen Lack of Transportation (Non-Medical):   Physical Activity:   . Days of Exercise per Week:   . Minutes of Exercise per Session:   Stress:   . Feeling of Stress :   Social Connections:   . Frequency of Communication with Friends and Family:   . Frequency of Social Gatherings with Friends and Family:   . Attends Religious Services:   . Active Member of Clubs or Organizations:   . Attends Archivist Meetings:   Marland Kitchen Marital Status:   Intimate Partner Violence:   . Fear of Current or  Ex-Partner:   . Emotionally Abused:   Marland Kitchen Physically Abused:   . Sexually Abused:     Current Outpatient Medications on File Prior to Visit  Medication Sig Dispense Refill  . albuterol (PROAIR HFA) 108 (90 Base) MCG/ACT inhaler Inhale 2 puffs into the lungs every 4 (four) hours as needed for wheezing. 1 Inhaler 2  . albuterol (PROVENTIL) (2.5 MG/3ML) 0.083% nebulizer solution Take 3 mLs by nebulization 4 (four) times daily as needed for wheezing or shortness of breath.   3  . amLODipine (NORVASC) 2.5 MG tablet Take 2.5 mg by mouth daily.    . baclofen (LIORESAL) 10 MG tablet Take 1 tablet (10 mg total) by mouth 2 (two) times daily. Caution for drowsiness. 60 each 5  . BD PEN NEEDLE NANO 2ND GEN 32G X 4 MM MISC USE TO INJECT INSULIN THREE TIMES DAILY 100 each 0  . Blood Glucose Calibration (ONETOUCH VERIO) SOLN Used to check meter accuracy (Patient taking differently: 1 each by Other route as needed. Use to calibrate meter prn) 1 each 0  . Blood Glucose Monitoring Suppl (ONETOUCH VERIO) w/Device KIT 1 each by Other route 2 (two) times daily.     . cimetidine (TAGAMET) 200 MG tablet Take 200 mg by mouth 2 (two) times daily.    . DULoxetine (CYMBALTA) 60 MG capsule Take 60 mg by mouth 2 (two) times daily.    Marland Kitchen EMGALITY 120 MG/ML SOAJ ADMINISTER 1 ML UNDER THE SKIN EVERY 30 DAYS (Patient taking differently: Inject 1 mL into the skin every 30 (thirty) days. ) 1 mL 11  . glucose blood (ONETOUCH VERIO) test strip 1 each by Other route 2 (two) times daily. E11.9 200 strip 0  . hydrOXYzine (VISTARIL) 25 MG capsule Take 25 mg by mouth 3 (three) times daily as needed for itching.    . Insulin Glargine-Lixisenatide (SOLIQUA) 100-33 UNT-MCG/ML SOPN Inject 50 Units into the skin every morning. (Patient taking differently: Inject 50 Units into the skin daily. ) 10 pen 11  . Lancets (ONETOUCH DELICA PLUS [id number]) MISC USE FOUR TIMES DAILY AS DIRECTED (Patient taking differently: 1 each by Other route 2 (two)  times daily. ) 500 each 0  . levothyroxine (SYNTHROID) 125 MCG tablet Take 1 tablet (125 mcg total) by mouth daily. 90 tablet 3  . lidocaine (LIDODERM) 5 % UNWRAP AND APPLY 1 PATCH TO SKIN DAILY FOR 15 DAYS. REMOVE AND DISCARD PATCH WITHIN 12 HOURS OR AS DIRECTED (Patient taking differently: Place 1 patch onto the skin daily as needed (pain). ) 15 patch 0  .  Lifitegrast (XIIDRA) 5 % SOLN Place 1 drop into both eyes daily as needed (dry eyes).     Marland Kitchen linaclotide (LINZESS) 290 MCG CAPS capsule Take 290 mcg by mouth daily before breakfast.     . Magnesium 250 MG TABS Take 1,000 mg by mouth at bedtime.    . montelukast (SINGULAIR) 10 MG tablet Take 10 mg by mouth daily.   3  . omeprazole (PRILOSEC) 40 MG capsule Take 40 mg by mouth 2 (two) times daily.     . ondansetron (ZOFRAN) 4 MG tablet Take 1 tablet (4 mg total) by mouth every 8 (eight) hours as needed for nausea or vomiting. 20 tablet 2  . pravastatin (PRAVACHOL) 10 MG tablet Take 10 mg by mouth every evening.    . pregabalin (LYRICA) 75 MG capsule Take 100 mg by mouth 2 (two) times daily. Only takes at night    . rizatriptan (MAXALT-MLT) 10 MG disintegrating tablet TAKE 1 TABLET BY MOUTH AT EARLIEST ONSET OF HEADACHE, MAY REPEAT ONCE IN 2 HOURS IF NEEDED (Patient taking differently: Take 10 mg by mouth as needed for migraine (AT EARLIEST ONSET OF HEADACHE, MAY REPEAT ONCE IN 2 HOURS IF NEEDED). ) 9 tablet 3  . Vitamin D, Ergocalciferol, (DRISDOL) 1.25 MG (50000 UT) CAPS capsule Take 50,000 Units by mouth every 7 (seven) days.     No current facility-administered medications on file prior to visit.    Allergies  Allergen Reactions  . Ergotrate [Ergonovine] Other (See Comments)    Pt does not ever want  . Ibuprofen Other (See Comments)    Patient has ulcer  . Latex Other (See Comments)    Breaks out  . Metformin And Related Other (See Comments)    Dizziness  . Yutopar [Ritodrine] Other (See Comments)    Pt does not ever want    Family  History  Problem Relation Age of Onset  . Hypertension Mother   . Diabetes Mother   . Cancer Mother        rectal  . Heart disease Father 9       MI x5  . Hypertension Father   . Diabetes Father   . Drug abuse Brother   . Alcohol abuse Brother   . Breast cancer Sister        unsure of age , was between 8 and 19  . Drug abuse Brother   . Alcohol abuse Brother   . Anxiety disorder Neg Hx   . Bipolar disorder Neg Hx   . Depression Neg Hx     BP (!) 144/76   Pulse 99   Ht '5\' 3"'  (1.6 m)   Wt 209 lb (94.8 kg)   LMP 12/06/2016 (Approximate)   SpO2 97%   BMI 37.02 kg/m    Review of Systems     Objective:   Physical Exam VITAL SIGNS:  See vs page GENERAL: no distress Pulses: dorsalis pedis intact bilat.   MSK: no deformity of the feet CV: no leg edema Skin:  no ulcer on the feet.  normal color and temp on the feet. Neuro: sensation is intact to touch on the feet  A1c=9.4%     Assessment & Plan:  Insulin-requiring type 2 DM: worse.  Noncompliance with meds.  In this setting, we'll increase the rx just slightly.  HTN: is noted today  Patient Instructions  Your blood pressure is high today.  Please see your primary care provider soon, to have it rechecked.   I  have sent a prescription to your pharmacy, to increase the Trulicity again.   Please continue the same [facility].   Thyroid blood tests are requested for you today.  We'll let you know about the results.  check your blood sugar once a day.  vary the time of day when you check, between before the 3 meals, and at bedtime.  also check if you have symptoms of your blood sugar being too high or too low.  please keep a record of the readings and bring it to your next appointment here (or you can bring the meter itself).  You can write it on any piece of paper.  please call us sooner if your blood sugar goes below 70, or if you have a lot of readings over 200.   Please come back for a follow-up appointment in 2 months.

## 2019-09-14 NOTE — Telephone Encounter (Signed)
APPROVAL  Medication: Trulicity 4.5mg /0.92mL Insurance Company: [employer] [place] response: APPROVED Approval dates: 06/28/19 through 06/27/20  Documents have been labeled and placed in scan file for HIM and for our future reference.

## 2019-09-24 ENCOUNTER — Ambulatory Visit: Payer: 59 | Admitting: Neurology

## 2019-10-06 ENCOUNTER — Telehealth: Payer: Self-pay | Admitting: Neurology

## 2019-10-06 ENCOUNTER — Other Ambulatory Visit: Payer: Self-pay | Admitting: Neurology

## 2019-10-06 MED ORDER — EMGALITY 120 MG/ML ~~LOC~~ SOAJ: 120.0000 mg | SUBCUTANEOUS | 11 refills | Status: DC

## 2019-10-06 NOTE — Telephone Encounter (Signed)
Pt called stating she needs her Emgality 120 mg prescription refilled. She is waking up with headaches and she is out of the medication. Please call pt. [facility] on [address] and [address]. Pt was told the refill wasn't approved and she needs to contact her provider.

## 2019-10-06 NOTE — Telephone Encounter (Signed)
Prescription sent

## 2019-10-15 ENCOUNTER — Telehealth: Payer: Self-pay | Admitting: Endocrinology

## 2019-10-15 NOTE — Telephone Encounter (Signed)
Patient called stating she threw up, she is sweating profusely, and her sugar level was 320 this morning. She sounds very tired on the phone and she said she does not have strength to take check her sugar. Please advise. [phone]

## 2019-10-15 NOTE — Telephone Encounter (Signed)
Pt was advised that, due to vomiting and other symptoms, she should contact EMS for emergency assistance.

## 2019-10-25 ENCOUNTER — Other Ambulatory Visit: Payer: Self-pay

## 2019-10-27 ENCOUNTER — Other Ambulatory Visit (INDEPENDENT_AMBULATORY_CARE_PROVIDER_SITE_OTHER): Payer: 59

## 2019-10-27 ENCOUNTER — Encounter: Payer: Self-pay | Admitting: Endocrinology

## 2019-10-27 ENCOUNTER — Ambulatory Visit (INDEPENDENT_AMBULATORY_CARE_PROVIDER_SITE_OTHER): Payer: 59 | Admitting: Endocrinology

## 2019-10-27 ENCOUNTER — Other Ambulatory Visit: Payer: Self-pay

## 2019-10-27 VITALS — BP 140/82 | HR 98 | Ht 63.0 in | Wt 211.0 lb

## 2019-10-27 DIAGNOSIS — E119 Type 2 diabetes mellitus without complications: Secondary | ICD-10-CM

## 2019-10-27 DIAGNOSIS — Z794 Long term (current) use of insulin: Secondary | ICD-10-CM | POA: Diagnosis not present

## 2019-10-27 LAB — BASIC METABOLIC PANEL
BUN: 10 mg/dL (ref 6–23)
CO2: 28 mEq/L (ref 19–32)
Calcium: 8.9 mg/dL (ref 8.4–10.5)
Chloride: 103 mEq/L (ref 96–112)
Creatinine, Ser: 0.91 mg/dL (ref 0.40–1.20)
GFR: 77.96 mL/min (ref >60.00)
Glucose, Bld: 167 mg/dL — ABNORMAL HIGH (ref 70–99)
Potassium: 4.1 mEq/L (ref 3.5–5.1)
Sodium: 137 mEq/L (ref 135–145)

## 2019-10-27 LAB — POCT GLYCOSYLATED HEMOGLOBIN (HGB A1C): Hemoglobin A1C: 8.7 % — AB (ref 4.0–5.6)

## 2019-10-27 LAB — TSH: TSH: 2.29 u[IU]/mL (ref 0.35–4.50)

## 2019-10-27 MED ORDER — LANTUS SOLOSTAR 100 UNIT/ML ~~LOC~~ SOPN: 60.0000 [IU] | PEN_INJECTOR | SUBCUTANEOUS | 99 refills | Status: DC

## 2019-10-27 NOTE — Patient Instructions (Addendum)
Your blood pressure is high today.  Please see your primary care provider soon, to have it rechecked.   I have sent a prescription to your pharmacy, to change Soliuqa to lantus.  Please continue the same other diabetes meds.   Thyroid blood tests are requested for you today.  We'll let you know about the results.  check your blood sugar once a day.  vary the time of day when you check, between before the 3 meals, and at bedtime.  also check if you have symptoms of your blood sugar being too high or too low.  please keep a record of the readings and bring it to your next appointment here (or you can bring the meter itself).  You can write it on any piece of paper.  please call us sooner if your blood sugar goes below 70, or if you have a lot of readings over 200.   Please come back for a follow-up appointment in 2 months.      Bariatric Surgery You have so much to gain by losing weight.  You may have already tried every diet and exercise plan imaginable.  And, you may have sought advice from your family physician, too.   Sometimes, in spite of such diligent efforts, you may not be able to achieve long-term results by yourself.  In cases of severe obesity, bariatric or weight loss surgery is a proven method of achieving long-term weight control.  Our Services Our bariatric surgery programs offer our patients new hope and long-term weight-loss solution.  Since introducing our services in 2003, we have conducted more than 2,400 successful procedures.  Our program is designated as a Programmer, multimedia by the Metabolic and Bariatric Surgery Accreditation and Quality Improvement Program (MBSAQIP), a IT trainer that sets rigorous patient safety and outcome standards.  Our program is also designated as a Ecologist by [employer].   Our exceptional weight-loss surgery team specializes in diagnosis, treatment, follow-up care, and ongoing support for our patients with  severe weight loss challenges.  We currently offer laparoscopic sleeve gastrectomy, gastric bypass, and adjustable gastric band (LAP-BAND).    Attend our [place] Choosing to undergo a bariatric procedure is a big decision, and one that should not be taken lightly.  You now have two options in how you learn about weight-loss surgery - in person or online.  Our objective is to ensure you have all of the information that you need to evaluate the advantages and obligations of this life changing procedure.  Please note that you are not alone in this process, and our experienced team is ready to assist and answer all of your questions.  There are several ways to register for a seminar (either on-line or in person): 1)  Call [phone] 2) Go on-line to [facility] and register for either type of seminar.  [email or web address]

## 2019-10-27 NOTE — Progress Notes (Signed)
Subjective:    Patient ID: Kendra Hall, female    DOB: 06-01-1966, 54 y.o.   MRN: [id number]  HPI Pt returns for f/u of diabetes mellitus:  DM type: Insulin-requiring type 2 Dx'ed: 6301 Complications: renal insuff.    Therapy: Soliqua and Trulicity.   GDM: 1984.   DKA: never.   Severe hypoglycemia: once, in 2018.  Pancreatitis: never.  Other: metformin caused dizziness; she takes both trulicity and soliqua, and she wants to continue; she also took insulin 2012-2018.  she did not tolerate invokana (vaginitis); renal insuff limits rx options.   Interval history: No recent steroids.  Pt says she never misses her meds.  Pt says recent episode of nausea was not due to medication.  Despite continuation of the meds, nausea is resolved.  no cbg record, but states cbg's vary from 98-385.   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  She takes synthroid as rx'ed.   Obesity: pt says she took non-rx diet pills in 2016.  She has also joined [employer] [employer]" (online weight loss program).  She belonged to a gym in 2015.  She has also taken DM meds which help the weight Past Medical History:  Diagnosis Date  . Allergy   . Anemia    history of  . Anxiety   . Asthma   . Depression   . Diabetes mellitus    type 2   . Fibromyalgia   . Gastric ulcer   . GERD (gastroesophageal reflux disease)   . Grave's disease   . Graves disease   . H/O therapeutic radiation   . Migraine   . Multiple thyroid nodules   . Myofacial muscle pain   . Osteoarthritis    Facet hypertrophy with injections  . TIA (transient ischemic attack)    02/2015    Past Surgical History:  Procedure Laterality Date  . AXILLARY LYMPH NODE DISSECTION  2001  . CHOLECYSTECTOMY N/A 09/03/2018   Procedure: LAPAROSCOPIC CHOLECYSTECTOMY;  Surgeon: Coralie Keens, MD;  Location: [facility];  Service: General;  Laterality: N/A;  . HYDRADENITIS EXCISION    . TUBAL LIGATION  1995  .  VAGINAL HYSTERECTOMY Left 03/11/2017   Procedure: HYSTERECTOMY VAGINAL W/LEFT PROXIMAL SALPINGECTOMY;  Surgeon: Princess Bruins, MD;  Location: [facility];  Service: Gynecology;  Laterality: Left;  request 7:30am OR time  request 2 hours      Social History   Socioeconomic History  . Marital status: Single    Spouse name: Not on file  . Number of children: 4  . Years of education: 38  . Highest education level: Not on file  Occupational History  . Occupation: Research scientist (physical sciences): [employer]  Tobacco Use  . Smoking status: Former Smoker    Packs/day: 0.50    Years: 30.00    Pack years: 15.00    Types: Cigarettes    Quit date: 04/24/2018    Years since quitting: 1.5  . Smokeless tobacco: Never Used  Substance and Sexual Activity  . Alcohol use: No    Comment: rare  . 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  Today denies all drug abuse  . Sexual activity: Not Currently    Birth control/protection: Surgical  Other Topics Concern  . Not on file  Social History Narrative   Lives alone in  [place]. Her daugther occassoinally comes and stay with her. 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  originally from [place],  [place].  Raised by mom and her sister who is 8 yrs older than pt. Pt has several half siblings. No longer on disability. Pt has 4 kids, never married.          Social Determinants of Health   Financial Resource Strain:   . Difficulty of Paying Living Expenses:   Food Insecurity:   . Worried About Charity fundraiser in the Last Year:   . Arboriculturist in the Last Year:   Transportation Needs:   . Film/video editor (Medical):   Marland Kitchen Lack of Transportation (Non-Medical):   Physical Activity:   . Days of Exercise per Week:   . Minutes of Exercise per Session:   Stress:   . Feeling of Stress :    Social Connections:   . Frequency of Communication with Friends and Family:   . Frequency of Social Gatherings with Friends and Family:   . Attends Religious Services:   . Active Member of Clubs or Organizations:   . Attends Archivist Meetings:   Marland Kitchen Marital Status:   Intimate Partner Violence:   . Fear of Current or Ex-Partner:   . Emotionally Abused:   Marland Kitchen Physically Abused:   . Sexually Abused:     Current Outpatient Medications on File Prior to Visit  Medication Sig Dispense Refill  . albuterol (PROAIR HFA) 108 (90 Base) MCG/ACT inhaler Inhale 2 puffs into the lungs every 4 (four) hours as needed for wheezing. 1 Inhaler 2  . albuterol (PROVENTIL) (2.5 MG/3ML) 0.083% nebulizer solution Take 3 mLs by nebulization 4 (four) times daily as needed for wheezing or shortness of breath.   3  . amLODipine (NORVASC) 2.5 MG tablet Take 2.5 mg by mouth daily.    . baclofen (LIORESAL) 10 MG tablet Take 1 tablet (10 mg total) by mouth 2 (two) times daily. Caution for drowsiness. 60 each 5  . BD PEN NEEDLE NANO 2ND GEN 32G X 4 MM MISC USE TO INJECT INSULIN THREE TIMES DAILY 100 each 0  . Blood Glucose Calibration (ONETOUCH VERIO) SOLN Used to check meter accuracy (Patient taking differently: 1 each by Other route as needed. Use to calibrate meter prn) 1 each 0  . Blood Glucose Monitoring Suppl (ONETOUCH VERIO) w/Device KIT 1 each by Other route 2 (two) times daily.     . cimetidine (TAGAMET) 200 MG tablet Take 200 mg by mouth 2 (two) times daily.    . Dulaglutide (TRULICITY) 4.5 RS/8.5IO SOPN Inject 4.5 mg as directed once a week. 12 pen 3  . DULoxetine (CYMBALTA) 60 MG capsule Take 60 mg by mouth 2 (two) times daily.    . Galcanezumab-gnlm (EMGALITY) 120 MG/ML SOAJ Inject 120 mg into the skin every 30 (thirty) days. 1 pen 11  . glucose blood (ONETOUCH VERIO) test strip 1 each by Other route 2 (two) times daily. E11.9 200 strip 0  . hydrOXYzine (VISTARIL) 25 MG capsule Take 25 mg by mouth 3  (three) times daily as needed for itching.    . Lancets (ONETOUCH DELICA PLUS [id number]) MISC USE FOUR TIMES DAILY AS DIRECTED (Patient taking differently: 1 each by Other route 2 (two) times daily. ) 500 each 0  . levothyroxine (SYNTHROID) 150 MCG tablet Take 1 tablet (150 mcg total) by mouth daily  before breakfast. 90 tablet 3  . lidocaine (LIDODERM) 5 % UNWRAP AND APPLY 1 PATCH TO SKIN DAILY FOR 15 DAYS. REMOVE AND DISCARD PATCH WITHIN 12 HOURS OR AS DIRECTED (Patient taking differently: Place 1 patch onto the skin daily as needed (pain). ) 15 patch 0  . Lifitegrast (XIIDRA) 5 % SOLN Place 1 drop into both eyes daily as needed (dry eyes).     Marland Kitchen linaclotide (LINZESS) 290 MCG CAPS capsule Take 290 mcg by mouth daily before breakfast.     . Magnesium 250 MG TABS Take 1,000 mg by mouth at bedtime.    . montelukast (SINGULAIR) 10 MG tablet Take 10 mg by mouth daily.   3  . omeprazole (PRILOSEC) 40 MG capsule Take 40 mg by mouth 2 (two) times daily.     . ondansetron (ZOFRAN) 4 MG tablet Take 1 tablet (4 mg total) by mouth every 8 (eight) hours as needed for nausea or vomiting. 20 tablet 2  . pravastatin (PRAVACHOL) 10 MG tablet Take 10 mg by mouth every evening.    . pregabalin (LYRICA) 75 MG capsule Take 100 mg by mouth 2 (two) times daily. Only takes at night    . rizatriptan (MAXALT-MLT) 10 MG disintegrating tablet TAKE 1 TABLET BY MOUTH AT EARLIEST ONSET OF HEADACHE, MAY REPEAT ONCE IN 2 HOURS IF NEEDED (Patient taking differently: Take 10 mg by mouth as needed for migraine (AT EARLIEST ONSET OF HEADACHE, MAY REPEAT ONCE IN 2 HOURS IF NEEDED). ) 9 tablet 3  . Vitamin D, Ergocalciferol, (DRISDOL) 1.25 MG (50000 UT) CAPS capsule Take 50,000 Units by mouth every 7 (seven) days.     No current facility-administered medications on file prior to visit.    Allergies  Allergen Reactions  . Ergotrate [Ergonovine] Other (See Comments)    Pt does not ever want  . Ibuprofen Other (See Comments)     Patient has ulcer  . Latex Other (See Comments)    Breaks out  . Metformin And Related Other (See Comments)    Dizziness  . Yutopar [Ritodrine] Other (See Comments)    Pt does not ever want    Family History  Problem Relation Age of Onset  . Hypertension Mother   . Diabetes Mother   . Cancer Mother        rectal  . Heart disease Father 44       MI x5  . Hypertension Father   . Diabetes Father   . Drug abuse Brother   . Alcohol abuse Brother   . Breast cancer Sister        unsure of age , was between 60 and 33  . Drug abuse Brother   . Alcohol abuse Brother   . Anxiety disorder Neg Hx   . Bipolar disorder Neg Hx   . Depression Neg Hx     BP 140/82   Pulse 98   Ht '5\' 3"'  (1.6 m)   Wt 211 lb (95.7 kg)   LMP 12/06/2016 (Approximate)   SpO2 98%   BMI 37.38 kg/m     Review of Systems She denies hypoglycemia.     Objective:   Physical Exam VITAL SIGNS:  See vs page GENERAL: no distress Pulses: dorsalis pedis intact bilat.   MSK: no deformity of the feet CV: no leg edema Skin:  no ulcer on the feet.  normal color and temp on the feet. Neuro: sensation is intact to touch on the feet    Lab Results  Component Value Date   HGBA1C 8.7 (A) 10/27/2019   Lab Results  Component Value Date   TSH 2.29 10/27/2019   T3TOTAL 113.37 03/27/2019      Assessment & Plan:  HTN: is noted today.  Hypothyroidism: well-replaced.  Please continue the same medication.  Insulin-requiring type 2 DM, with CRI: she needs to phase out Soliqua, due to max dosage of GLP  Patient Instructions  Your blood pressure is high today.  Please see your primary care provider soon, to have it rechecked.   I have sent a prescription to your pharmacy, to change Soliuqa to lantus.  Please continue the same other diabetes meds.   Thyroid blood tests are requested for you today.  We'll let you know about the results.  check your blood sugar once a day.  vary the time of day when you check, between  before the 3 meals, and at bedtime.  also check if you have symptoms of your blood sugar being too high or too low.  please keep a record of the readings and bring it to your next appointment here (or you can bring the meter itself).  You can write it on any piece of paper.  please call us sooner if your blood sugar goes below 70, or if you have a lot of readings over 200.   Please come back for a follow-up appointment in 2 months.      Bariatric Surgery You have so much to gain by losing weight.  You may have already tried every diet and exercise plan imaginable.  And, you may have sought advice from your family physician, too.   Sometimes, in spite of such diligent efforts, you may not be able to achieve long-term results by yourself.  In cases of severe obesity, bariatric or weight loss surgery is a proven method of achieving long-term weight control.  Our Services Our bariatric surgery programs offer our patients new hope and long-term weight-loss solution.  Since introducing our services in 2003, we have conducted more than 2,400 successful procedures.  Our program is designated as a Programmer, multimedia by the Metabolic and Bariatric Surgery Accreditation and Quality Improvement Program (MBSAQIP), a IT trainer that sets rigorous patient safety and outcome standards.  Our program is also designated as a Ecologist by [employer].   Our exceptional weight-loss surgery team specializes in diagnosis, treatment, follow-up care, and ongoing support for our patients with severe weight loss challenges.  We currently offer laparoscopic sleeve gastrectomy, gastric bypass, and adjustable gastric band (LAP-BAND).    Attend our [place] Choosing to undergo a bariatric procedure is a big decision, and one that should not be taken lightly.  You now have two options in how you learn about weight-loss surgery - in person or online.  Our objective is to ensure you  have all of the information that you need to evaluate the advantages and obligations of this life changing procedure.  Please note that you are not alone in this process, and our experienced team is ready to assist and answer all of your questions.  There are several ways to register for a seminar (either on-line or in person): 1)  Call [phone] 2) Go on-line to [facility] and register for either type of seminar.  [email or web address]

## 2019-10-28 ENCOUNTER — Ambulatory Visit: Payer: 59 | Admitting: Endocrinology

## 2019-11-15 ENCOUNTER — Telehealth: Payer: Self-pay | Admitting: Endocrinology

## 2019-11-15 ENCOUNTER — Encounter: Payer: Self-pay | Admitting: Endocrinology

## 2019-11-15 MED ORDER — TRULICITY 1.5 MG/0.5ML ~~LOC~~ SOAJ: 1.5000 mg | SUBCUTANEOUS | 3 refills | Status: DC

## 2019-11-19 ENCOUNTER — Other Ambulatory Visit (HOSPITAL_COMMUNITY): Payer: Self-pay | Admitting: Gastroenterology

## 2019-11-19 ENCOUNTER — Other Ambulatory Visit: Payer: Self-pay | Admitting: Gastroenterology

## 2019-11-19 DIAGNOSIS — R110 Nausea: Secondary | ICD-10-CM

## 2019-12-05 ENCOUNTER — Encounter: Payer: Self-pay | Admitting: Endocrinology

## 2019-12-05 ENCOUNTER — Other Ambulatory Visit: Payer: Self-pay | Admitting: Endocrinology

## 2019-12-05 DIAGNOSIS — E119 Type 2 diabetes mellitus without complications: Secondary | ICD-10-CM

## 2019-12-05 DIAGNOSIS — Z794 Long term (current) use of insulin: Secondary | ICD-10-CM

## 2019-12-06 ENCOUNTER — Encounter (HOSPITAL_COMMUNITY)
Admission: RE | Admit: 2019-12-06 | Discharge: 2019-12-06 | Disposition: A | Discharge status: Home / Self Care | Payer: 59 | Source: Ambulatory Visit | Attending: Gastroenterology | Admitting: Gastroenterology

## 2019-12-06 ENCOUNTER — Other Ambulatory Visit: Payer: Self-pay

## 2019-12-06 ENCOUNTER — Ambulatory Visit (HOSPITAL_COMMUNITY): Payer: 59

## 2019-12-06 DIAGNOSIS — R110 Nausea: Secondary | ICD-10-CM | POA: Insufficient documentation

## 2019-12-06 MED ORDER — ONETOUCH VERIO VI STRP: 1.0000 | ORAL_STRIP | Freq: Two times a day (BID) | 0 refills | Status: DC

## 2019-12-06 MED ORDER — TECHNETIUM TC 99M SULFUR COLLOID
2.1000 | Freq: Once | INTRAVENOUS | Status: AC | PRN
  Administered 2019-12-06: 2.1 via INTRAVENOUS

## 2019-12-06 NOTE — Telephone Encounter (Signed)
Please see message . Thank you .

## 2019-12-29 ENCOUNTER — Other Ambulatory Visit: Payer: Self-pay

## 2019-12-29 ENCOUNTER — Ambulatory Visit (INDEPENDENT_AMBULATORY_CARE_PROVIDER_SITE_OTHER): Payer: 59 | Admitting: Endocrinology

## 2019-12-29 ENCOUNTER — Encounter: Payer: Self-pay | Admitting: Endocrinology

## 2019-12-29 VITALS — BP 134/80 | HR 96 | Ht 63.0 in | Wt 208.8 lb

## 2019-12-29 DIAGNOSIS — E119 Type 2 diabetes mellitus without complications: Secondary | ICD-10-CM

## 2019-12-29 DIAGNOSIS — Z794 Long term (current) use of insulin: Secondary | ICD-10-CM | POA: Diagnosis not present

## 2019-12-29 LAB — POCT GLYCOSYLATED HEMOGLOBIN (HGB A1C): Hemoglobin A1C: 9.1 % — AB (ref 4.0–5.6)

## 2019-12-29 MED ORDER — LANTUS SOLOSTAR 100 UNIT/ML ~~LOC~~ SOPN: 20.0000 [IU] | PEN_INJECTOR | SUBCUTANEOUS | 99 refills | Status: DC

## 2019-12-29 MED ORDER — TRULICITY 0.75 MG/0.5ML ~~LOC~~ SOAJ: 0.7500 mg | SUBCUTANEOUS | 11 refills | Status: DC

## 2019-12-29 NOTE — Patient Instructions (Addendum)
I have referred you to a dietician. I have sent a prescription to your pharmacy, to reduce the Trulicity.   Please take 20 units of Lantus, every day.   check your blood sugar once a day.  vary the time of day when you check, between before the 3 meals, and at bedtime.  also check if you have symptoms of your blood sugar being too high or too low.  please keep a record of the readings and bring it to your next appointment here (or you can bring the meter itself).  You can write it on any piece of paper.  please call us sooner if your blood sugar goes below 70, or if you have a lot of readings over 200.   Please come back for a follow-up appointment in 2 months.

## 2019-12-29 NOTE — Progress Notes (Signed)
Subjective:    Patient ID: Kendra Hall, female    DOB: [birth year], 54 y.o.   MRN: [id number]  HPI Pt returns for f/u of diabetes mellitus:  DM type: Insulin-requiring type 2 Dx'ed: 3254 Complications: renal insuff.    Therapy: Soliqua and Trulicity.   GDM: 1984.   DKA: never.   Severe hypoglycemia: once, in 2018.  Pancreatitis: never.  Other: metformin caused dizziness; she also took insulin 2012-2018.  she did not tolerate invokana (vaginitis); renal insuff limits rx options.   Interval history: No recent steroids.  Pt says she often misses the insulin.  Nausea and abd bloating persists.  no cbg record, but states cbg's vary from 82-200's.  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  She takes synthroid as rx'ed.   Past Medical History:  Diagnosis Date  . Allergy   . Anemia    history of  . Anxiety   . Asthma   . Depression   . Diabetes mellitus    type 2   . Fibromyalgia   . Gastric ulcer   . GERD (gastroesophageal reflux disease)   . Grave's disease   . Graves disease   . H/O therapeutic radiation   . Migraine   . Multiple thyroid nodules   . Myofacial muscle pain   . Osteoarthritis    Facet hypertrophy with injections  . TIA (transient ischemic attack)    02/2015    Past Surgical History:  Procedure Laterality Date  . AXILLARY LYMPH NODE DISSECTION  2001  . CHOLECYSTECTOMY N/A 09/03/2018   Procedure: LAPAROSCOPIC CHOLECYSTECTOMY;  Surgeon: Coralie Keens, MD;  Location: [facility];  Service: General;  Laterality: N/A;  . HYDRADENITIS EXCISION    . TUBAL LIGATION  1995  . VAGINAL HYSTERECTOMY Left 03/11/2017   Procedure: HYSTERECTOMY VAGINAL W/LEFT PROXIMAL SALPINGECTOMY;  Surgeon: Princess Bruins, MD;  Location: [facility];  Service: Gynecology;  Laterality: Left;  request 7:30am OR time  request 2 hours      Social History   Socioeconomic History  . Marital status: Single    Spouse name: Not on file    . Number of children: 4  . Years of education: 30  . Highest education level: Not on file  Occupational History  . Occupation: Research scientist (physical sciences): [employer]  Tobacco Use  . Smoking status: Former Smoker    Packs/day: 0.50    Years: 30.00    Pack years: 15.00    Types: Cigarettes    Quit date: 04/24/2018    Years since quitting: 1.6  . Smokeless tobacco: Never Used  Vaping Use  . Vaping Use: Never used  Substance and Sexual Activity  . Alcohol use: No    Comment: rare  . Drug use: No    Comment: cocaine use daily for 3 yrs in her 17's. 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  Today denies all drug abuse  . Sexual activity: Not Currently    Birth control/protection: Surgical  Other Topics Concern  . Not on file  Social History Narrative   Lives alone in [place]. Her daugther occassoinally comes and stay with her. 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  originally from [place],  [place].  Raised by mom and her sister who is 8 yrs older than pt. Pt has several half siblings. No longer on disability. Pt  has 4 kids, never married.          Social Determinants of Health   Financial Resource Strain:   . Difficulty of Paying Living Expenses:   Food Insecurity:   . Worried About Charity fundraiser in the Last Year:   . Arboriculturist in the Last Year:   Transportation Needs:   . Film/video editor (Medical):   Marland Kitchen Lack of Transportation (Non-Medical):   Physical Activity:   . Days of Exercise per Week:   . Minutes of Exercise per Session:   Stress:   . Feeling of Stress :   Social Connections:   . Frequency of Communication with Friends and Family:   . Frequency of Social Gatherings with Friends and Family:   . Attends Religious Services:   . Active Member of Clubs or Organizations:   . Attends Archivist Meetings:   Marland Kitchen Marital Status:   Intimate Partner  Violence:   . Fear of Current or Ex-Partner:   . Emotionally Abused:   Marland Kitchen Physically Abused:   . Sexually Abused:     Current Outpatient Medications on File Prior to Visit  Medication Sig Dispense Refill  . albuterol (PROAIR HFA) 108 (90 Base) MCG/ACT inhaler Inhale 2 puffs into the lungs every 4 (four) hours as needed for wheezing. 1 Inhaler 2  . albuterol (PROVENTIL) (2.5 MG/3ML) 0.083% nebulizer solution Take 3 mLs by nebulization 4 (four) times daily as needed for wheezing or shortness of breath.   3  . amLODipine (NORVASC) 2.5 MG tablet Take 2.5 mg by mouth daily.    . baclofen (LIORESAL) 10 MG tablet Take 1 tablet (10 mg total) by mouth 2 (two) times daily. Caution for drowsiness. 60 each 5  . BD PEN NEEDLE NANO 2ND GEN 32G X 4 MM MISC USE TO INJECT INSULIN THREE TIMES DAILY 100 each 0  . Blood Glucose Calibration (ONETOUCH VERIO) SOLN Used to check meter accuracy (Patient taking differently: 1 each by Other route as needed. Use to calibrate meter prn) 1 each 0  . Blood Glucose Monitoring Suppl (ONETOUCH VERIO) w/Device KIT 1 each by Other route 2 (two) times daily.     . cimetidine (TAGAMET) 200 MG tablet Take 200 mg by mouth 2 (two) times daily.    . DULoxetine (CYMBALTA) 60 MG capsule Take 60 mg by mouth 2 (two) times daily.    . Galcanezumab-gnlm (EMGALITY) 120 MG/ML SOAJ Inject 120 mg into the skin every 30 (thirty) days. 1 pen 11  . glucose blood (ONETOUCH VERIO) test strip 1 each by Other route 2 (two) times daily. E11.9 200 strip 0  . hydrOXYzine (VISTARIL) 25 MG capsule Take 25 mg by mouth 3 (three) times daily as needed for itching.    . Lancets (ONETOUCH DELICA PLUS [id number]) MISC USE FOUR TIMES DAILY AS DIRECTED (Patient taking differently: 1 each by Other route 2 (two) times daily. ) 500 each 0  . levothyroxine (SYNTHROID) 150 MCG tablet Take 1 tablet (150 mcg total) by mouth daily before breakfast. 90 tablet 3  . lidocaine (LIDODERM) 5 % UNWRAP AND APPLY 1 PATCH TO SKIN  DAILY FOR 15 DAYS. REMOVE AND DISCARD PATCH WITHIN 12 HOURS OR AS DIRECTED (Patient taking differently: Place 1 patch onto the skin daily as needed (pain). ) 15 patch 0  . Lifitegrast (XIIDRA) 5 % SOLN Place 1 drop into both eyes daily as needed (dry eyes).     Marland Kitchen linaclotide (LINZESS) 290 MCG  CAPS capsule Take 290 mcg by mouth daily before breakfast.     . Magnesium 250 MG TABS Take 1,000 mg by mouth at bedtime.    . montelukast (SINGULAIR) 10 MG tablet Take 10 mg by mouth daily.   3  . omeprazole (PRILOSEC) 40 MG capsule Take 40 mg by mouth 2 (two) times daily.     . ondansetron (ZOFRAN) 4 MG tablet Take 1 tablet (4 mg total) by mouth every 8 (eight) hours as needed for nausea or vomiting. 20 tablet 2  . pravastatin (PRAVACHOL) 10 MG tablet Take 10 mg by mouth every evening.    . pregabalin (LYRICA) 75 MG capsule Take 100 mg by mouth 2 (two) times daily. Only takes at night    . rizatriptan (MAXALT-MLT) 10 MG disintegrating tablet TAKE 1 TABLET BY MOUTH AT EARLIEST ONSET OF HEADACHE, MAY REPEAT ONCE IN 2 HOURS IF NEEDED (Patient taking differently: Take 10 mg by mouth as needed for migraine (AT EARLIEST ONSET OF HEADACHE, MAY REPEAT ONCE IN 2 HOURS IF NEEDED). ) 9 tablet 3  . Vitamin D, Ergocalciferol, (DRISDOL) 1.25 MG (50000 UT) CAPS capsule Take 50,000 Units by mouth every 7 (seven) days.     No current facility-administered medications on file prior to visit.    Allergies  Allergen Reactions  . Ergotrate [Ergonovine] Other (See Comments)    Pt does not ever want  . Ibuprofen Other (See Comments)    Patient has ulcer  . Latex Other (See Comments)    Breaks out  . Metformin And Related Other (See Comments)    Dizziness  . Yutopar [Ritodrine] Other (See Comments)    Pt does not ever want    Family History  Problem Relation Age of Onset  . Hypertension Mother   . Diabetes Mother   . Cancer Mother        rectal  . Heart disease Father 17       MI x5  . Hypertension Father   .  Diabetes Father   . Drug abuse Brother   . Alcohol abuse Brother   . Breast cancer Sister        unsure of age , was between 61 and 18  . Drug abuse Brother   . Alcohol abuse Brother   . Anxiety disorder Neg Hx   . Bipolar disorder Neg Hx   . Depression Neg Hx     BP 134/80   Pulse 96   Ht _0  (1.6 m)   Wt 208 lb 12.8 oz (94.7 kg)   LMP 12/06/2016 (Approximate)   SpO2 95%   BMI 36.99 kg/m    Review of Systems She denies hypoglycemia.      Objective:   Physical Exam VITAL SIGNS:  See vs page GENERAL: no distress Pulses: dorsalis pedis intact bilat.   MSK: no deformity of the feet CV: no leg edema Skin:  no ulcer on the feet.  normal color and temp on the feet. Neuro: sensation is intact to touch on the feet   Lab Results  Component Value Date   HGBA1C 9.1 (A) 12/29/2019   Lab Results  Component Value Date   CREATININE 0.91 10/27/2019   BUN 10 10/27/2019   NA 137 10/27/2019   K 4.1 10/27/2019   CL 103 10/27/2019   CO2 28 10/27/2019   Lab Results  Component Value Date   TSH 2.29 10/27/2019   T3TOTAL 113.37 03/27/2019       Assessment & Plan:  Insulin-requiring type 2 DM, with CRI: therapy limited by noncompliance.   Nausea, persistent. Due to Trulicity   Patient Instructions  I have referred you to a dietician. I have sent a prescription to your pharmacy, to reduce the Trulicity.   Please take 20 units of Lantus, every day.   check your blood sugar once a day.  vary the time of day when you check, between before the 3 meals, and at bedtime.  also check if you have symptoms of your blood sugar being too high or too low.  please keep a record of the readings and bring it to your next appointment here (or you can bring the meter itself).  You can write it on any piece of paper.  please call us sooner if your blood sugar goes below 70, or if you have a lot of readings over 200.   Please come back for a follow-up appointment in 2 months.

## 2020-01-03 ENCOUNTER — Telehealth: Payer: Self-pay | Admitting: Neurology

## 2020-01-03 NOTE — Telephone Encounter (Signed)
Patient called in with some new symptoms. She is on Emgality and the last few days she has been having very vivid dreams and has been waking up with headaches.

## 2020-01-03 NOTE — Telephone Encounter (Signed)
I don't think the Emgality is causing that.  Her other medications are more likely to cause vivid dreams, such as baclofen.

## 2020-01-04 ENCOUNTER — Other Ambulatory Visit: Payer: Self-pay | Admitting: Neurology

## 2020-01-04 NOTE — Telephone Encounter (Signed)
Kendra Hall,  Would you contact the patient to see if she is available for an office visit tomorrow at 11:10?

## 2020-01-04 NOTE — Telephone Encounter (Signed)
Pt is sch for a virtual with Jaffe on 01-05-20

## 2020-01-05 ENCOUNTER — Telehealth (INDEPENDENT_AMBULATORY_CARE_PROVIDER_SITE_OTHER): Payer: 59 | Admitting: Neurology

## 2020-01-05 ENCOUNTER — Encounter: Payer: Self-pay | Admitting: Neurology

## 2020-01-05 ENCOUNTER — Other Ambulatory Visit: Payer: Self-pay

## 2020-01-05 VITALS — Ht 64.0 in | Wt 203.0 lb

## 2020-01-05 DIAGNOSIS — R5383 Other fatigue: Secondary | ICD-10-CM | POA: Diagnosis not present

## 2020-01-05 DIAGNOSIS — G43009 Migraine without aura, not intractable, without status migrainosus: Secondary | ICD-10-CM

## 2020-01-05 DIAGNOSIS — R519 Headache, unspecified: Secondary | ICD-10-CM

## 2020-01-05 MED ORDER — ZONISAMIDE 25 MG PO CAPS: ORAL_CAPSULE | ORAL | 0 refills | Status: DC

## 2020-01-05 MED ORDER — RIZATRIPTAN BENZOATE 10 MG PO TBDP: ORAL_TABLET | ORAL | 3 refills | Status: DC

## 2020-01-05 NOTE — Progress Notes (Signed)
Virtual Visit via Video Note The purpose of this virtual visit is to provide medical care while limiting exposure to the novel coronavirus.    Consent was obtained for video visit:  Yes.   Answered questions that patient had about telehealth interaction:  Yes.   I discussed the limitations, risks, security and privacy concerns of performing an evaluation and management service by telemedicine. I also discussed with the patient that there may be a patient responsible charge related to this service. The patient expressed understanding and agreed to proceed.  Pt location: Home Physician Location: office Name of referring provider:  Simona Huh, NP I connected with Kendra Hall at patients initiation/request on 01/05/2020 at 11:10 AM EDT by video enabled telemedicine application and verified that I am speaking with the correct person using two identifiers. Pt MRN:  [id number] Pt DOB:  06/17/1966 Video Participants:  Kendra Hall   History of Present Illness:  Kendra Hall is a 54year old woman with type 2 diabetes mellitus, hypothyroidism, fibromyalgia and depression who follows up for migraines and muscle spasms.Marland Kitchen  UPDATE: For the past 4 days, she has been waking up every morning with a headache, a severe squeezing on the top of her head.  It tends to happen if she wakes up from a vivid dream.  If she takes Maxalt, it aborts in 1 to 2 hours, otherwise it would last 1/2 a day.  She has had a change in her sleep schedule beginning a couple of weeks ago in which she started sleeping during the day and has been up at night.  She reports that she has had a formal eye exam this past year which was unremarkable.  Current NSAIDS:none Current analgesics:Hydrocodone most days for fibromyalgia but has been treating headaches with them as well Current triptans:Maxalt MLT 10 mg Current ergotamine:None Current anti-emetic:Promethazine  Current muscle relaxants:baclofen Current  anti-anxiolytic:BuSpar Current sleep aide:None Current Antihypertensive medications:Would not use beta-blocker due to asthma Current Antidepressant medications:Cymbalta, Lyrica Current Anticonvulsant medications:none Current anti-CGRP:Emgality (first dose 10/31) Current Vitamins/Herbal/Supplements:None Current Antihistamines/Decongestants:None Other therapy:None  Caffeine:No Diet:Drinks Exercise:Notroutine Depression:Yes; Anxiety:Yes Other pain:Neck pain, fibromyalgia Sleep hygiene:Good  HISTORY: Onset: She has had history of migrainessince her 20s,but theybecame frequent in 2018. Location: Varies: back of head, across forehead, temples Quality: Varies: squeezing, sharp Initial Intensity: 10/10 Aura: no Prodrome: no Postdrome: no Associated symptoms:Nausea, photophobia, phonophobia. She denies associated vomiting, visual disturbance, or unilateral numbness or weakness. It is not a new thunderclap headache. Initial Duration: All day (usually lets up in 30 minutes after taking Maxalt, however lately headache returns in 2 hours) Initial Frequency: Every other day Initial Frequency of abortive medication: every other day Triggers: None Relieving factors: Sleep Activity: aggravates  Past NSAIDS/steroids: ibuprofen, prednisone (unable to take0 Past analgesics: Tylenol, Excedrin Migraine Past abortive triptans: Sumatriptan tablet Past muscle relaxants: no Past anti-emetic: Zofran Past antihypertensive medications: no Past antidepressant medications: sertraline 68m,nortriptyline 739mas needed for sleep (cannot take nightly due to extreme dry mouth), Lexapro 206mast anticonvulsant medications: topiramate immediate release 100m26mognitive problems),Topiramate ER 100 mg (effective) Past vitamins/Herbal/Supplements: no Other past therapies: no  Family history of headache: cousins  MRI and MRA of head from 03/15/15 were  personally reviewed and were unremarkable except for minimal punctate white matter foci.  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 CT cervical spine from 01/09/18 showed cervical spondylosis with degenerative changesgreatest at C4-C7 levels with bilateral neural foraminal stenosis, as well as mild multifactorial C5-6  canal stenosis. She is treated by pain management.  Past Medical History: Past Medical History:  Diagnosis Date  . Allergy   . Anemia    history of  . Anxiety   . Asthma   . Depression   . Diabetes mellitus    type 2   . Fibromyalgia   . Gastric ulcer   . GERD (gastroesophageal reflux disease)   . Grave's disease   . Graves disease   . H/O therapeutic radiation   . Migraine   . Multiple thyroid nodules   . Myofacial muscle pain   . Osteoarthritis    Facet hypertrophy with injections  . TIA (transient ischemic attack)    02/2015    Medications: Outpatient Encounter Medications as of 01/05/2020  Medication Sig Note  . albuterol (PROAIR HFA) 108 (90 Base) MCG/ACT inhaler Inhale 2 puffs into the lungs every 4 (four) hours as needed for wheezing.   Marland Kitchen albuterol (PROVENTIL) (2.5 MG/3ML) 0.083% nebulizer solution Take 3 mLs by nebulization 4 (four) times daily as needed for wheezing or shortness of breath.    Marland Kitchen amLODipine (NORVASC) 2.5 MG tablet Take 2.5 mg by mouth daily.   . baclofen (LIORESAL) 10 MG tablet Take 1 tablet (10 mg total) by mouth 2 (two) times daily. Caution for drowsiness.   . BD PEN NEEDLE NANO 2ND GEN 32G X 4 MM MISC USE TO INJECT INSULIN THREE TIMES DAILY   . Blood Glucose Calibration (ONETOUCH VERIO) SOLN Used to check meter accuracy (Patient taking differently: 1 each by Other route as needed. Use to calibrate meter prn)   . Blood Glucose Monitoring Suppl (ONETOUCH VERIO) w/Device KIT 1 each by Other route 2 (two) times daily.    . cimetidine (TAGAMET) 200 MG tablet Take 200 mg by mouth 2 (two) times  daily.   . Dulaglutide (TRULICITY) 1.61 WR/6.0AV SOPN Inject 0.5 mLs (0.75 mg total) into the skin once a week.   . DULoxetine (CYMBALTA) 60 MG capsule Take 60 mg by mouth 2 (two) times daily.   . Galcanezumab-gnlm (EMGALITY) 120 MG/ML SOAJ Inject 120 mg into the skin every 30 (thirty) days.   Marland Kitchen glucose blood (ONETOUCH VERIO) test strip 1 each by Other route 2 (two) times daily. E11.9   . hydrOXYzine (VISTARIL) 25 MG capsule Take 25 mg by mouth 3 (three) times daily as needed for itching.   . insulin glargine (LANTUS SOLOSTAR) 100 UNIT/ML Solostar Pen Inject 20 Units into the skin every morning. And pen needles 1/day   . Lancets (ONETOUCH DELICA PLUS [id number]) MISC USE FOUR TIMES DAILY AS DIRECTED (Patient taking differently: 1 each by Other route 2 (two) times daily. )   . levothyroxine (SYNTHROID) 150 MCG tablet Take 1 tablet (150 mcg total) by mouth daily before breakfast.   . lidocaine (LIDODERM) 5 % UNWRAP AND APPLY 1 PATCH TO SKIN DAILY FOR 15 DAYS. REMOVE AND DISCARD PATCH WITHIN 12 HOURS OR AS DIRECTED (Patient taking differently: Place 1 patch onto the skin daily as needed (pain). )   . Lifitegrast (XIIDRA) 5 % SOLN Place 1 drop into both eyes daily as needed (dry eyes).    Marland Kitchen linaclotide (LINZESS) 290 MCG CAPS capsule Take 290 mcg by mouth daily before breakfast.    . Magnesium 250 MG TABS Take 1,000 mg by mouth at bedtime.   . montelukast (SINGULAIR) 10 MG tablet Take 10 mg by mouth daily.    Marland Kitchen omeprazole (PRILOSEC) 40 MG capsule Take 40 mg by mouth 2 (  two) times daily.    . ondansetron (ZOFRAN) 4 MG tablet Take 1 tablet (4 mg total) by mouth every 8 (eight) hours as needed for nausea or vomiting.   . pravastatin (PRAVACHOL) 10 MG tablet Take 10 mg by mouth every evening. 11/29/2018: Hasn't been taking, doctor's orders; will be put back on it.  . pregabalin (LYRICA) 75 MG capsule Take 100 mg by mouth 2 (two) times daily. Only takes at night   . rizatriptan (MAXALT-MLT) 10 MG disintegrating  tablet TAKE 1 TABLET BY MOUTH AT EARLIEST ONSET OF HEADACHE, MAY REPEAT ONCE IN 2 HOURS IF NEEDED (Patient taking differently: Take 10 mg by mouth as needed for migraine (AT EARLIEST ONSET OF HEADACHE, MAY REPEAT ONCE IN 2 HOURS IF NEEDED). )   . Vitamin D, Ergocalciferol, (DRISDOL) 1.25 MG (50000 UT) CAPS capsule Take 50,000 Units by mouth every 7 (seven) days.    No facility-administered encounter medications on file as of 01/05/2020.    Allergies: Allergies  Allergen Reactions  . Ergotrate [Ergonovine] Other (See Comments)    Pt does not ever want  . Ibuprofen Other (See Comments)    Patient has ulcer  . Latex Other (See Comments)    Breaks out  . Metformin And Related Other (See Comments)    Dizziness  . Yutopar [Ritodrine] Other (See Comments)    Pt does not ever want    Family History: Family History  Problem Relation Age of Onset  . Hypertension Mother   . Diabetes Mother   . Cancer Mother        rectal  . Heart disease Father 30       MI x5  . Hypertension Father   . Diabetes Father   . Drug abuse Brother   . Alcohol abuse Brother   . Breast cancer Sister        unsure of age , was between 63 and 34  . Drug abuse Brother   . Alcohol abuse Brother   . Anxiety disorder Neg Hx   . Bipolar disorder Neg Hx   . Depression Neg Hx     Social History: Social History   Socioeconomic History  . Marital status: Single    Spouse name: Not on file  . Number of children: 4  . Years of education: 75  . Highest education level: Not on file  Occupational History  . Occupation: Research scientist (physical sciences): [employer]  Tobacco Use  . Smoking status: Former Smoker    Packs/day: 0.50    Years: 30.00    Pack years: 15.00    Types: Cigarettes    Quit date: 04/24/2018    Years since quitting: 1.7  . Smokeless tobacco: Never Used  Vaping Use  . Vaping Use: Never used  Substance and Sexual Activity  . Alcohol use: No    Comment: rare  . 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  Today denies all drug abuse  . Sexual activity: Not Currently    Birth control/protection: Surgical  Other Topics Concern  . Not on file  Social History Narrative   Lives alone in [place]. Her daugther occassoinally comes and stay with her. 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  originally from [place],  [place].  Raised by mom and her sister  who is 8 yrs older than pt. Pt has several half siblings. No longer on disability. Pt has 4 kids, never married.          Social Determinants of Health   Financial Resource Strain:   . Difficulty of Paying Living Expenses:   Food Insecurity:   . Worried About Charity fundraiser in the Last Year:   . Arboriculturist in the Last Year:   Transportation Needs:   . Film/video editor (Medical):   Marland Kitchen Lack of Transportation (Non-Medical):   Physical Activity:   . Days of Exercise per Week:   . Minutes of Exercise per Session:   Stress:   . Feeling of Stress :   Social Connections:   . Frequency of Communication with Friends and Family:   . Frequency of Social Gatherings with Friends and Family:   . Attends Religious Services:   . Active Member of Clubs or Organizations:   . Attends Archivist Meetings:   Marland Kitchen Marital Status:   Intimate Partner Violence:   . Fear of Current or Ex-Partner:   . Emotionally Abused:   Marland Kitchen Physically Abused:   . Sexually Abused:     Observations/Objective:   Height _0  (1.626 m), weight 203 lb (92.1 kg), last menstrual period 12/06/2016. No acute distress.  Alert and oriented.  Speech fluent and not dysarthric.  Language intact. .  Assessment and Plan:   1.  Morning headaches.  May be migraine aggravated by altered sleep schedule.  Also consider obstructive sleep apnea as she has fatigue as well.   2.  Migraine without aura,  without status migrainosus, not intractable  1.  Continue Emgality.  Will start zonisamide 29m daily for a week, then 573mdaily.  We can increase dose if needed. 2.  Refill Maxalt 3.  Limit use of pain relievers to no more than 2 days out of week to prevent risk of rebound or medication-overuse headache. 4.  Will order sleep study 5.  Follow up in September as already scheduled.  Follow Up Instructions:    -I discussed the assessment and treatment plan with the patient. The patient was provided an opportunity to ask questions and all were answered. The patient agreed with the plan and demonstrated an understanding of the instructions.   The patient was advised to call back or seek an in-person evaluation if the symptoms worsen or if the condition fails to improve as anticipated.   AdDudley MajorDO

## 2020-01-05 NOTE — Progress Notes (Signed)
Sleep study added per Dr. Tomi Likens

## 2020-01-20 ENCOUNTER — Encounter: Payer: 59 | Attending: Endocrinology | Admitting: Dietician

## 2020-01-20 ENCOUNTER — Other Ambulatory Visit: Payer: Self-pay

## 2020-01-20 ENCOUNTER — Encounter: Payer: Self-pay | Admitting: Dietician

## 2020-01-20 ENCOUNTER — Telehealth: Payer: Self-pay | Admitting: Dietician

## 2020-01-20 DIAGNOSIS — E119 Type 2 diabetes mellitus without complications: Secondary | ICD-10-CM | POA: Diagnosis present

## 2020-01-20 DIAGNOSIS — Z794 Long term (current) use of insulin: Secondary | ICD-10-CM | POA: Insufficient documentation

## 2020-01-20 NOTE — Progress Notes (Signed)
Medical Nutrition Therapy:  Appt start time: 1100 end time:  1210.   Assessment:  Primary concerns today: .  Patient is here today alone.  She was referred by Dr. Loanne Drilling. She states that she has been diagnosed with gastroparesis and would like to learn what to eat regarding this although she states that the diagnosis is in question and she does not want to claim this diagnosis. She reports chronic nausea. She drinks regular Pepsi which she states helps her stomach. She is not exercising, has insomnia and is tired all of the time. She complains of increased bloating after even just water. She states that she has a safe food list (soft foods) but states that some foods she will tolerate one day and another day not tolerate them. She states that she is overwhelmed and depressed that she does not know what to eat and does not feel well.  History includes Type 2 Diabetes, GDM, GERD, bipolar disorder, history of sleep eating (without knowledge) - now stopped, spinal stenosis, fibromyalgia. Medications include Trulicity, Lantus 60 units per  BG 168 in the office today  Patient lives with her daughter.  They share shopping and cooking.  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  Preferred Learning Style:   No preference indicated   Learning Readiness:   Ready   DIETARY INTAKE: Likes spinach, green beans, collards, potatoes, french fries, corn Dislikes eggs 24-hr recall:  B ( AM): banana OR skips  Snk ( AM):   L ( PM): bologna and cheese sandwich on raisin bread with butter, banana, pickles Snk ( PM): watermelon D ( PM): BBQ ribs from [employer], baked potato with butter Snk ( PM): chips, fruit (grapes, peaches, and cherries without the skin, watermelon) Beverages: water, sweet tea (16 oz 5 times per week), Pepsi (24 oz per day)  Usual physical activity: ADL's  Progress Towards Goal(s):  In progress.   Nutritional  Diagnosis:  NB-1.1 Food and nutrition-related knowledge deficit As related to balance of carbohydrate, protein, and fat.  As evidenced by diet hx and patient report.    Intervention:  Nutrition education related to type 2 diabetes, gastroparesis and other GI concerns.  Discussed importance of blood sugar control and limiting of carbohydrate containing beverages.   Discussed need for small, frequent meals that are low in fat and fiber to improve digestion.   Recommended 2 carb choices per meal and 1 carb choice per snack with small amount of protein. Suggested Boost Glucose Control or Glucerna if she is unable to tolerate solid foods.  Plan: Rethink what you drink.  It is best for blood sugar control to drink beverages with no carbohydrates. Small frequent meals (4-6 per day) Choose foods low in fat and do not add much fat to your foods Avoid greasy foods Choose foods low in fiber Avoid spicy foods Avoid Caffeine and mint Chew foods well, eat slowly. Foods may need to be liquid if you are not tolerating   Teaching Method Utilized:  Visual Auditory Hands on  Handouts given during visit include:  Gastroparesis Nutrition Therapy from [facility]  GERD nutrition therapy from [facility]  Meal plan card  Barriers to learning/adherence to lifestyle change: health  Demonstrated degree of understanding via:  Teach Back   Monitoring/Evaluation:  Dietary intake, exercise, and body weight in 1 month(s).

## 2020-01-20 NOTE — Patient Instructions (Signed)
Rethink what you drink.  It is best for blood sugar control to drink beverages with no carbohydrates. Small frequent meals (4-6 per day) Choose foods low in fat and do not add much fat to your foods Avoid greasy foods Choose foods low in fiber Avoid spicy foods Avoid Caffeine and mint Chew foods well, eat slowly. Foods may need to be liquid if you are not tolerating

## 2020-02-07 ENCOUNTER — Telehealth: Payer: Self-pay | Admitting: Neurology

## 2020-02-07 NOTE — Telephone Encounter (Signed)
PT states every time she looks to the left or turn her head she will get dizzy. Pt unable to get out of the bed since Saturday. No matter what she do she is getting dizzy. Even laying down on her back or on her side. Pt c/o sharp pain behind ear toward the top of her head that lasted a couple of seconds. This happened twice over the weekend. Pt states no N&V at this time no sensitive to light or sound as well.   Pt wants to know what she should do. Should she schedule a visit to come into the office.

## 2020-02-07 NOTE — Telephone Encounter (Signed)
Pt advised she will need to schedule a visit to be referred out. Pt will call back tomorrow.

## 2020-02-07 NOTE — Telephone Encounter (Signed)
It's probably vertigo. She may need to go to vestibular rehab but I would need to see her first.

## 2020-02-07 NOTE — Telephone Encounter (Signed)
AccessNurse 02/07/20 @ 1:05pm:  "Caller states she is having dizzy spells when turning to the left."

## 2020-02-08 ENCOUNTER — Other Ambulatory Visit: Payer: Self-pay | Admitting: Neurology

## 2020-02-10 ENCOUNTER — Encounter: Payer: Self-pay | Admitting: Neurology

## 2020-02-10 ENCOUNTER — Ambulatory Visit (INDEPENDENT_AMBULATORY_CARE_PROVIDER_SITE_OTHER): Payer: 59 | Admitting: Neurology

## 2020-02-10 VITALS — BP 152/92 | HR 86 | Ht 63.5 in | Wt 207.0 lb

## 2020-02-10 DIAGNOSIS — G44011 Episodic cluster headache, intractable: Secondary | ICD-10-CM | POA: Insufficient documentation

## 2020-02-10 DIAGNOSIS — F518 Other sleep disorders not due to a substance or known physiological condition: Secondary | ICD-10-CM

## 2020-02-10 DIAGNOSIS — G4719 Other hypersomnia: Secondary | ICD-10-CM

## 2020-02-10 DIAGNOSIS — R0683 Snoring: Secondary | ICD-10-CM | POA: Diagnosis not present

## 2020-02-10 DIAGNOSIS — I10 Essential (primary) hypertension: Secondary | ICD-10-CM

## 2020-02-10 DIAGNOSIS — R519 Headache, unspecified: Secondary | ICD-10-CM

## 2020-02-10 NOTE — Progress Notes (Addendum)
SLEEP MEDICINE CLINIC    Provider:  Larey Seat, MD  Primary Care Physician:  Simona Huh, NP [place] [address]     Referring Provider:  Pleas Koch, DO        Chief Complaint according to patient   Patient presents with:    . New Patient (Initial Visit)     pt with daughter, rm 73. presents today for sleep consult. pt describes she has always had problems with insomnia despite taking medication. she states that she may fall asleep but she wakes up around an hour. states that when she does sleep she snores. never had a PSG/ HST.      HISTORY OF PRESENT ILLNESS:  Kendra Hall is a 54 y.o.African American female patient and seen here upon request by Dr Tomi Likens for a sleep evaluation/  a on 02/10/2020 - this is not a transfer of care.    Chief concern according to patient :  " I am woken up by headaches and a I wake up with morning headaches - may be headaches are sleep related "   I have the pleasure of seeing Kendra Hall today, a right-handed [place] American female with a possible sleep disorder.  She   has a past medical history of Allergy, Anemia, Anxiety, Asthma, Depression, Diabetes mellitus,Gastric ulcer, GERD (gastroesophageal reflux disease), hypothyroidism -Graves disease, H/O therapeutic radiation, Multiple thyroid nodules, Myofacial muscle pain, Osteoarthritis, and Morbid Obesity, TIA (transient ischemic attack).     Sleep relevant medical history: Nocturia/ 2-3 times on average,  Night terrors, thyroid radiation, migraine headaches.   Family medical /sleep history:  sister with OSA, another sister with insomnia.   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  The patient currently works daytime. 8 AM - 8 Pm.   Pets are not present. Tobacco use; quit 2019 , but is smoking again.  ETOH use: none ,  Caffeine intake in form of Coffee( /) Soda( 24 ounces a day  ) Tea ( 36 ounces a day) , no energy drinks. Regular exercise; none.      Sleep habits are as follows: The patient's dinner time is between 6-9 PM. The patient goes to bed at 11-12 PM and continues to sleep for 1-2 hours, wakes from headaches and for  bathroom breaks, the first time at 1-2 AM.  She struggles to go back to sleep. Has averaged approximately 4-5 hours of nocturnal sleep.  Her mind is racing.  Bedroom is dark, cool, quiet.  The preferred sleep position is prone and on her sides.  Sleeps  with the support of 6 pillows. Likes to be propped up.   Dreams are reportedly frequent/vividly and she is often  Lucid.  Very realistic. These wake her too. Nightmares. .  10.30 AM is the usual rise time. Works begins at 11 AM. The patient wakes up with an alarm.  She reports not feeling refreshed or restored in AM, with symptoms such as dry mouth and morning headaches , and residual fatigue.  Naps are taken frequently when she is off work. , lasting from 60-180 minutes and less refreshing than nocturnal sleep.    Review of Systems: Out of a complete 14 system review, the patient complains of only the following symptoms, and all other reviewed systems are negative.:  Fatigue, sleepiness , snoring, fragmented sleep, Insomnia -vertigo. the patient reports vivid dreams of  nightmarish and she wakes up at the end of a lucid dreams sometimes being not quite sure if she dreams or if this is reality.  She also reports waking up with headaches and from headaches.  The headaches she wakes up with sometimes are associated with nausea the headaches at night that may wake her up and more sharp and more stabbing and given her history of migraines these are very likely cluster headaches.  Cluster headaches are seen in patients with hypoxemia at night and therefore correlate stronger with the presence of obstructive sleep apnea.   How likely are you to doze in the following situations: 0 = not likely, 1 = slight  chance, 2 = moderate chance, 3 = high chance   Sitting and Reading? Watching Television? Sitting inactive in a public place (theater or meeting)? As a passenger in a car for an hour without a break? Lying down in the afternoon when circumstances permit? Sitting and talking to someone? Sitting quietly after lunch without alcohol? In a car, while stopped for a few minutes in traffic?   Total = 14/ 24 points   FSS endorsed at  63/ 63 points.  Excessive fatigue.  Cluster stabbing pain.   Social History   Socioeconomic History  . Marital status: Single    Spouse name: Not on file  . Number of children: 4  . Years of education: 55  . Highest education level: Not on file  Occupational History  . Occupation: Research scientist (physical sciences): [employer]  Tobacco Use  . Smoking status: Former Smoker    Packs/day: 0.50    Years: 30.00    Pack years: 15.00    Types: Cigarettes    Quit date: 04/24/2018    Years since quitting: 1.8  . Smokeless tobacco: Never Used  Vaping Use  . Vaping Use: Never used  Substance and Sexual Activity  . Alcohol use: No    Comment: rare  . 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  Today denies all drug abuse  . Sexual activity: Not Currently    Birth control/protection: Surgical  Other Topics Concern  . Not on file  Social History Narrative   Lives alone in [place]. Her daugther occassoinally comes and stay with her. 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  originally from [place],  [place].  Raised by mom and her sister who is 8 yrs older than pt. Pt has several half siblings. No longer on disability. Pt has 4 kids, never married.          Social Determinants of Health   Financial Resource Strain:   . Difficulty of Paying Living Expenses: Not on file  Food Insecurity:   . Worried About  Charity fundraiser in the Last Year: Not on file  . Ran Out of Food in the Last Year: Not on file  Transportation Needs:   . Lack of Transportation (Medical): Not on file  . Lack of Transportation (Non-Medical): Not on file  Physical Activity:   . Days of Exercise per Week: Not on file  . Minutes of Exercise per Session: Not on file  Stress:   . Feeling of Stress : Not on file  Social Connections:   . Frequency of Communication with Friends and Family: Not on file  .  Frequency of Social Gatherings with Friends and Family: Not on file  . Attends Religious Services: Not on file  . Active Member of Clubs or Organizations: Not on file  . Attends Archivist Meetings: Not on file  . Marital Status: Not on file    Family History  Problem Relation Age of Onset  . Hypertension Mother   . Diabetes Mother   . Cancer Mother        rectal  . Heart disease Father 53       MI x5  . Hypertension Father   . Diabetes Father   . Drug abuse Brother   . Alcohol abuse Brother   . Breast cancer Sister        unsure of age , was between 39 and 42  . Drug abuse Brother   . Alcohol abuse Brother   . Anxiety disorder Neg Hx   . Bipolar disorder Neg Hx   . Depression Neg Hx     Past Medical History:  Diagnosis Date  . Allergy   . Anemia    history of  . Anxiety   . Asthma   . Depression   . Diabetes mellitus    type 2   . Fibromyalgia   . Gastric ulcer   . GERD (gastroesophageal reflux disease)   . Grave's disease   . Graves disease   . H/O therapeutic radiation   . Migraine   . Multiple thyroid nodules   . Myofacial muscle pain   . Osteoarthritis    Facet hypertrophy with injections  . TIA (transient ischemic attack)    02/2015    Past Surgical History:  Procedure Laterality Date  . AXILLARY LYMPH NODE DISSECTION  2001  . CHOLECYSTECTOMY N/A 09/03/2018   Procedure: LAPAROSCOPIC CHOLECYSTECTOMY;  Surgeon: Coralie Keens, MD;  Location: [facility];  Service: General;   Laterality: N/A;  . HYDRADENITIS EXCISION    . TUBAL LIGATION  1995  . VAGINAL HYSTERECTOMY Left 03/11/2017   Procedure: HYSTERECTOMY VAGINAL W/LEFT PROXIMAL SALPINGECTOMY;  Surgeon: Princess Bruins, MD;  Location: [facility];  Service: Gynecology;  Laterality: Left;  request 7:30am OR time  request 2 hours       Current Outpatient Medications on File Prior to Visit  Medication Sig Dispense Refill  . albuterol (PROAIR HFA) 108 (90 Base) MCG/ACT inhaler Inhale 2 puffs into the lungs every 4 (four) hours as needed for wheezing. 1 Inhaler 2  . albuterol (PROVENTIL) (2.5 MG/3ML) 0.083% nebulizer solution Take 3 mLs by nebulization 4 (four) times daily as needed for wheezing or shortness of breath.   3  . amLODipine (NORVASC) 2.5 MG tablet Take 2.5 mg by mouth daily.    . baclofen (LIORESAL) 10 MG tablet TAKE 1 TABLET BY MOUTH TWICE DAILY. CAUTION FOR DROWSINESS 60 tablet 0  . BD PEN NEEDLE NANO 2ND GEN 32G X 4 MM MISC USE TO INJECT INSULIN THREE TIMES DAILY 100 each 0  . Blood Glucose Calibration (ONETOUCH VERIO) SOLN Used to check meter accuracy (Patient taking differently: 1 each by Other route as needed. Use to calibrate meter prn) 1 each 0  . Blood Glucose Monitoring Suppl (ONETOUCH VERIO) w/Device KIT 1 each by Other route 2 (two) times daily.     . cimetidine (TAGAMET) 200 MG tablet Take 200 mg by mouth 2 (two) times daily.     . Dulaglutide (TRULICITY) 2.26 JF/3.5KT SOPN Inject 0.5 mLs (0.75 mg total) into the skin once a week. 4 pen  11  . DULoxetine (CYMBALTA) 60 MG capsule Take 60 mg by mouth 2 (two) times daily.    . Galcanezumab-gnlm (EMGALITY) 120 MG/ML SOAJ Inject 120 mg into the skin every 30 (thirty) days. 1 pen 11  . glucose blood (ONETOUCH VERIO) test strip 1 each by Other route 2 (two) times daily. E11.9 200 strip 0  . hydrOXYzine (VISTARIL) 25 MG capsule Take 25 mg by mouth 3 (three) times daily as needed for itching.    . insulin glargine (LANTUS SOLOSTAR) 100 UNIT/ML Solostar  Pen Inject 20 Units into the skin every morning. And pen needles 1/day 10 pen PRN  . Lancets (ONETOUCH DELICA PLUS [id number]) MISC USE FOUR TIMES DAILY AS DIRECTED (Patient taking differently: 1 each by Other route 2 (two) times daily. ) 500 each 0  . levothyroxine (SYNTHROID) 150 MCG tablet Take 1 tablet (150 mcg total) by mouth daily before breakfast. 90 tablet 3  . lidocaine (LIDODERM) 5 % UNWRAP AND APPLY 1 PATCH TO SKIN DAILY FOR 15 DAYS. REMOVE AND DISCARD PATCH WITHIN 12 HOURS OR AS DIRECTED (Patient taking differently: Place 1 patch onto the skin daily as needed (pain). ) 15 patch 0  . Lifitegrast (XIIDRA) 5 % SOLN Place 1 drop into both eyes daily as needed (dry eyes).     Marland Kitchen linaclotide (LINZESS) 290 MCG CAPS capsule Take 290 mcg by mouth daily before breakfast.     . Magnesium 250 MG TABS Take 1,000 mg by mouth at bedtime.    . montelukast (SINGULAIR) 10 MG tablet Take 10 mg by mouth daily.   3  . omeprazole (PRILOSEC) 40 MG capsule Take 40 mg by mouth 2 (two) times daily.     Marland Kitchen oxyCODONE-acetaminophen (PERCOCET) 7.5-325 MG tablet Take 1 tablet by mouth daily as needed.     . pravastatin (PRAVACHOL) 10 MG tablet Take 10 mg by mouth every evening.     . pregabalin (LYRICA) 75 MG capsule Take 100 mg by mouth 2 (two) times daily. Only takes at night    . promethazine (PHENERGAN) 12.5 MG tablet Take 12.5 mg by mouth every 6 (six) hours as needed for nausea or vomiting.    . rizatriptan (MAXALT-MLT) 10 MG disintegrating tablet DISSOLVE 1 TABLET ON THE TONGUE AT ONSET OF HEADACHE. MAY REPEAT 1 TIME IN 2 HOURS AS NEEDED 9 tablet 3  . Vitamin D, Ergocalciferol, (DRISDOL) 1.25 MG (50000 UT) CAPS capsule Take 50,000 Units by mouth every 7 (seven) days.    Marland Kitchen zonisamide (ZONEGRAN) 25 MG capsule TAKE 2 CAPSULES BY MOUTH DAILY 60 capsule 0   No current facility-administered medications on file prior to visit.    Allergies  Allergen Reactions  . Ergotrate [Ergonovine] Other (See Comments)    Pt  does not ever want  . Ibuprofen Other (See Comments)    Patient has ulcer  . Latex Other (See Comments)    Breaks out  . Metformin And Related Other (See Comments)    Dizziness  . Yutopar [Ritodrine] Other (See Comments)    Pt does not ever want    Physical exam:  Today's Vitals   02/10/20 1011  BP: (!) 152/92  Pulse: 86  Weight: 207 lb (93.9 kg)  Height: 5' 3.5" (1.613 m)   Body mass index is 36.09 kg/m.   Wt Readings from Last 3 Encounters:  02/10/20 207 lb (93.9 kg)  01/20/20 (!) 206 lb (93.4 kg)  01/05/20 203 lb (92.1 kg)     Ht Readings from  Last 3 Encounters:  02/10/20 5' 3.5" (1.613 m)  01/05/20 _0  (1.626 m)  12/29/19 _1  (1.6 m)      General: The patient is awake, alert and appears not in acute distress. The patient is groomed. Head: Normocephalic, atraumatic.  Neck is supple. Mallampati 3 plus, macroglossia.  ,  neck circumference:16 inches . Nasal airflow congested- Retrognathia is not noted.   Dental status: poor.  Cardiovascular:  Regular rate and cardiac rhythm by pulse,  without distended neck veins. Respiratory: Lungs are clear to auscultation.  Skin:  Without evidence of ankle edema, or rash. Trunk: The patient's posture is erect.   Neurologic exam : The patient is awake and alert, oriented to place and time.   Memory subjective described as intact.  Attention span & concentration ability appears normal.  Speech is fluent,  without  dysarthria, dysphonia or aphasia.  Mood and affect are appropriate.   Cranial nerves: no loss of smell or taste reported  Pupils are equal and briskly reactive to light. Funduscopic exam deferred. .  Extraocular movements in vertical and horizontal planes were intact and without nystagmus. No Diplopia. Visual fields by finger perimetry are intact. Hearing was intact to soft voice and finger rubbing.    Facial sensation intact to fine touch.  Facial motor strength is symmetric and tongue and uvula move midline.   Neck ROM : rotation, tilt and flexion extension were normal for age and shoulder shrug was symmetrical.    Motor exam:  Symmetric bulk, tone and ROM.     Sensory:   was normal.   Gait and station: Patient could rise unassisted from a seated position, walked without assistive device.  Stance is of normal width/ base .  Toe and heel walk were deferred.  Deep tendon reflexes: in the  upper and lower extremities are symmetric and intact.  Babinski response was deferred.      After spending a total time of  45 minutes face to face and additional time for physical and neurologic examination, review of laboratory studies,  personal review of imaging studies, reports and results of other testing and review of referral information / records as far as provided in visit, I have established the following assessments: Mrs. Lampley has a history of migraines but she has evolved into cluster headaches and also wakes up with morning headaches and dry mouth feeling never refreshed and restored.   Her sleep has been fragmented for a long time by vivid dreams by nocturia and by cluster headaches.  She has relapsed into smoking but she does not smoke a pack a day.  The patient was seen in a video visit by Dr.Jeffrey she also has a history of TIA 5 years ago multiple thyroid nodules with Graves' disease status post radiation therapy, I reviewed her medication she is an insulin-dependent diabetic who takes Phenergan , Lyrica, Maxalt.    My Plan is to proceed with:  1)  This patient will need an attended sleep study to rule out hypoxemia, OSA and  Early REM sleep onset - she has some features of narcolepsy. I will order a PSG and a HST ( [facility]) >  2) if apnea test is negative , she may stay or return  for MSLT.    I would like to thank Dr Tomi Likens for allowing me to meet with his pleasant patient. I will keep him informed about the sleep test results.  I plan to follow up either personally or through our NP  within 2-3  month.    Electronically signed by: Larey Seat, MD 02/10/2020 10:12 AM  [facility] Board certified by The [employer] of Sleep Medicine and Diplomate of the [employer] of Sleep Medicine. Board certified In Neurology through the [place], Fellow of the [employer] of Neurology. Medical Director of [facility].

## 2020-02-10 NOTE — Progress Notes (Signed)
NEUROLOGY FOLLOW UP OFFICE NOTE  Kendra Hall [id number]  HISTORY OF PRESENT ILLNESS: Kendra Hall is a 54year old woman with type 2 diabetes mellitus, hypothyroidism, fibromyalgia and depression who follows up for migrainesand now dizziness.  UPDATE: She was started on zonisamide last month and referred to sleep medicine for evaluation of OSA.  She saw Dr. Brett Fairy at [facility] yesterday who suspects OSA.  She is scheduled for a sleep study.  2 weeks.  If laying in bed and she turns to the left, she develops spinning sensation.  It lasts a few seconds.  No nausea.  Notes double vision during those few seconds.    Current NSAIDS:none Current analgesics:Hydrocodone most days for fibromyalgia but has been treating headaches with them as well Current triptans:Maxalt MLT 10 mg Current ergotamine:None Current anti-emetic:Promethazine 12.37m Current muscle relaxants:baclofen Current anti-anxiolytic:BuSpar Current sleep aide:None Current Antihypertensive medications:Would not use beta-blocker due to asthma Current Antidepressant medications:Cymbalta 668mBID Current Anticonvulsant medications:zonisamide 5076mt bedtime, Lyrica 100m58mD Current anti-CGRP:Emgality  Current Vitamins/Herbal/Supplements:None Current Antihistamines/Decongestants:None Other therapy:None  Caffeine:No Diet:Drinks Exercise:Notroutine Depression:Yes; Anxiety:Yes Other pain:Neck pain, fibromyalgia Sleep hygiene:Good  HISTORY: Onset: She has had history of migrainessince her 20s,but theybecame frequent in 2018. Location: Varies: back of head, across forehead, temples Quality: Varies: squeezing, sharp Initial Intensity: 10/10 Aura: no Prodrome: no Postdrome: no Associated symptoms:Nausea, photophobia, phonophobia. She denies associated vomiting, visual disturbance, or unilateral numbness or weakness. It is not a new  thunderclap headache. Initial Duration: All day (usually lets up in 30 minutes after taking Maxalt, however lately headache returns in 2 hours) Initial Frequency: Every other day Initial Frequency of abortive medication: every other day Triggers: None Relieving factors: Sleep Activity: aggravates  In July 2021, she began waking up every morning with a headache, a severe squeezing on the top of her head.  It tends to happen if she wakes up from a vivid dream.  If she takes Maxalt, it aborts in 1 to 2 hours, otherwise it would last 1/2 a day.  She has had a change in her sleep schedule beginning a couple of weeks ago in which she started sleeping during the day and has been up at night.  She reports that she has had a formal eye exam this past year which was unremarkable.  Past NSAIDS/steroids: ibuprofen, prednisone (unable to take0 Past analgesics: Tylenol, Excedrin Migraine Past abortive triptans: Sumatriptan tablet Past muscle relaxants: no Past anti-emetic: Zofran Past antihypertensive medications: no Past antidepressant medications: sertraline 50mg75mtriptyline 75mg 41meeded for sleep (cannot take nightly due to extreme dry mouth), Lexapro 20mg P70manticonvulsant medications: topiramate immediate release 100mg (c46mtive problems),Topiramate ER 100 mg (effective) Past vitamins/Herbal/Supplements: no Other past therapies: no  Family history of headache: cousins  MRI and MRA of head from 03/15/15 were personally reviewed and were unremarkable except for minimal punctate white matter foci.  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 CT cervical spine from 01/09/18 showed cervical spondylosis with degenerative changesgreatest at C4-C7 levels with bilateral neural foraminal stenosis, as well as mild multifactorial C5-6 canal stenosis. She is treated by pain management.  PAST MEDICAL HISTORY: Past Medical History:  Diagnosis  Date  . Allergy   . Anemia    history of  . Anxiety   . Asthma   . Depression   . Diabetes mellitus    type 2   . Fibromyalgia   . Gastric ulcer   . GERD (gastroesophageal reflux disease)   .  Grave's disease   . Graves disease   . H/O therapeutic radiation   . Migraine   . Multiple thyroid nodules   . Myofacial muscle pain   . Osteoarthritis    Facet hypertrophy with injections  . TIA (transient ischemic attack)    02/2015    MEDICATIONS: Current Outpatient Medications on File Prior to Visit  Medication Sig Dispense Refill  . albuterol (PROAIR HFA) 108 (90 Base) MCG/ACT inhaler Inhale 2 puffs into the lungs every 4 (four) hours as needed for wheezing. 1 Inhaler 2  . albuterol (PROVENTIL) (2.5 MG/3ML) 0.083% nebulizer solution Take 3 mLs by nebulization 4 (four) times daily as needed for wheezing or shortness of breath.   3  . amLODipine (NORVASC) 2.5 MG tablet Take 2.5 mg by mouth daily.    . baclofen (LIORESAL) 10 MG tablet TAKE 1 TABLET BY MOUTH TWICE DAILY. CAUTION FOR DROWSINESS 60 tablet 0  . BD PEN NEEDLE NANO 2ND GEN 32G X 4 MM MISC USE TO INJECT INSULIN THREE TIMES DAILY 100 each 0  . Blood Glucose Calibration (ONETOUCH VERIO) SOLN Used to check meter accuracy (Patient taking differently: 1 each by Other route as needed. Use to calibrate meter prn) 1 each 0  . Blood Glucose Monitoring Suppl (ONETOUCH VERIO) w/Device KIT 1 each by Other route 2 (two) times daily.     . cimetidine (TAGAMET) 200 MG tablet Take 200 mg by mouth 2 (two) times daily.     . Dulaglutide (TRULICITY) 2.69 SW/5.4OE SOPN Inject 0.5 mLs (0.75 mg total) into the skin once a week. 4 pen 11  . DULoxetine (CYMBALTA) 60 MG capsule Take 60 mg by mouth 2 (two) times daily.    . Galcanezumab-gnlm (EMGALITY) 120 MG/ML SOAJ Inject 120 mg into the skin every 30 (thirty) days. 1 pen 11  . glucose blood (ONETOUCH VERIO) test strip 1 each by Other route 2 (two) times daily. E11.9 200 strip 0  . hydrOXYzine  (VISTARIL) 25 MG capsule Take 25 mg by mouth 3 (three) times daily as needed for itching.    . insulin glargine (LANTUS SOLOSTAR) 100 UNIT/ML Solostar Pen Inject 20 Units into the skin every morning. And pen needles 1/day 10 pen PRN  . Lancets (ONETOUCH DELICA PLUS [id number]) MISC USE FOUR TIMES DAILY AS DIRECTED (Patient taking differently: 1 each by Other route 2 (two) times daily. ) 500 each 0  . levothyroxine (SYNTHROID) 150 MCG tablet Take 1 tablet (150 mcg total) by mouth daily before breakfast. 90 tablet 3  . lidocaine (LIDODERM) 5 % UNWRAP AND APPLY 1 PATCH TO SKIN DAILY FOR 15 DAYS. REMOVE AND DISCARD PATCH WITHIN 12 HOURS OR AS DIRECTED (Patient taking differently: Place 1 patch onto the skin daily as needed (pain). ) 15 patch 0  . Lifitegrast (XIIDRA) 5 % SOLN Place 1 drop into both eyes daily as needed (dry eyes).     Marland Kitchen linaclotide (LINZESS) 290 MCG CAPS capsule Take 290 mcg by mouth daily before breakfast.     . Magnesium 250 MG TABS Take 1,000 mg by mouth at bedtime.    . montelukast (SINGULAIR) 10 MG tablet Take 10 mg by mouth daily.   3  . omeprazole (PRILOSEC) 40 MG capsule Take 40 mg by mouth 2 (two) times daily.     Marland Kitchen oxyCODONE-acetaminophen (PERCOCET) 7.5-325 MG tablet Take 1 tablet by mouth daily as needed.     . pravastatin (PRAVACHOL) 10 MG tablet Take 10 mg by mouth every evening.     Marland Kitchen  pregabalin (LYRICA) 75 MG capsule Take 100 mg by mouth 2 (two) times daily. Only takes at night    . promethazine (PHENERGAN) 12.5 MG tablet Take 12.5 mg by mouth every 6 (six) hours as needed for nausea or vomiting.    . rizatriptan (MAXALT-MLT) 10 MG disintegrating tablet DISSOLVE 1 TABLET ON THE TONGUE AT ONSET OF HEADACHE. MAY REPEAT 1 TIME IN 2 HOURS AS NEEDED 9 tablet 3  . Vitamin D, Ergocalciferol, (DRISDOL) 1.25 MG (50000 UT) CAPS capsule Take 50,000 Units by mouth every 7 (seven) days.    Marland Kitchen zonisamide (ZONEGRAN) 25 MG capsule TAKE 2 CAPSULES BY MOUTH DAILY 60 capsule 0   No current  facility-administered medications on file prior to visit.    ALLERGIES: Allergies  Allergen Reactions  . Ergotrate [Ergonovine] Other (See Comments)    Pt does not ever want  . Ibuprofen Other (See Comments)    Patient has ulcer  . Latex Other (See Comments)    Breaks out  . Metformin And Related Other (See Comments)    Dizziness  . Yutopar [Ritodrine] Other (See Comments)    Pt does not ever want    FAMILY HISTORY: Family History  Problem Relation Age of Onset  . Hypertension Mother   . Diabetes Mother   . Cancer Mother        rectal  . Heart disease Father 63       MI x5  . Hypertension Father   . Diabetes Father   . Drug abuse Brother   . Alcohol abuse Brother   . Breast cancer Sister        unsure of age , was between 33 and 15  . Drug abuse Brother   . Alcohol abuse Brother   . Anxiety disorder Neg Hx   . Bipolar disorder Neg Hx   . Depression Neg Hx    SOCIAL HISTORY: Social History   Socioeconomic History  . Marital status: Single    Spouse name: Not on file  . Number of children: 4  . Years of education: 45  . Highest education level: Not on file  Occupational History  . Occupation: Research scientist (physical sciences): [employer]  Tobacco Use  . Smoking status: Former Smoker    Packs/day: 0.50    Years: 30.00    Pack years: 15.00    Types: Cigarettes    Quit date: 04/24/2018    Years since quitting: 1.8  . Smokeless tobacco: Never Used  Vaping Use  . Vaping Use: Never used  Substance and Sexual Activity  . Alcohol use: No    Comment: rare  . 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  Today denies all drug abuse  . Sexual activity: Not Currently    Birth control/protection: Surgical  Other Topics Concern  . Not on file  Social History Narrative   Lives alone in [place]. Her daugther occassoinally comes and stay with her. Pt is working Therapist, art at [employer]. Enjoys reading and pt has an associates in applied sciences.  originally from [place],  [place].  Raised by mom and her sister who is 8 yrs older than pt. Pt has several half siblings. No longer on disability. Pt has 4 kids, never married.          Social Determinants of Health   Financial Resource Strain:   .  Difficulty of Paying Living Expenses: Not on file  Food Insecurity:   . Worried About Charity fundraiser in the Last Year: Not on file  . Ran Out of Food in the Last Year: Not on file  Transportation Needs:   . Lack of Transportation (Medical): Not on file  . Lack of Transportation (Non-Medical): Not on file  Physical Activity:   . Days of Exercise per Week: Not on file  . Minutes of Exercise per Session: Not on file  Stress:   . Feeling of Stress : Not on file  Social Connections:   . Frequency of Communication with Friends and Family: Not on file  . Frequency of Social Gatherings with Friends and Family: Not on file  . Attends Religious Services: Not on file  . Active Member of Clubs or Organizations: Not on file  . Attends Archivist Meetings: Not on file  . Marital Status: Not on file  Intimate Partner Violence:   . Fear of Current or Ex-Partner: Not on file  . Emotionally Abused: Not on file  . Physically Abused: Not on file  . Sexually Abused: Not on file      PHYSICAL EXAM: Blood pressure (!) 141/85, pulse 92, height '5\' 3"'  (1.6 m), weight 207 lb (93.9 kg), last menstrual period 12/06/2016, SpO2 98 %. General: No acute distress.  Patient appears well-groomed.   Head:  Normocephalic/atraumatic.   Ears:  TMs clear bilaterally Eyes:  Fundi examined but not visualized Neck: supple, no paraspinal tenderness, full range of motion Heart:  Regular rate and rhythm Lungs:  Clear to auscultation bilaterally Back: No paraspinal tenderness Neurological Exam: alert and oriented to person, place, and time. Attention span and concentration intact, recent  and remote memory intact, fund of knowledge intact.  Speech fluent and not dysarthric, language intact.  CN II-XII intact. Bulk and tone normal, muscle strength 5/5 throughout.  Sensation to light touch intact.  Deep tendon reflexes 2+ throughout.  Finger to nose and heel to shin testing intact.  Gait normal, Romberg with mild sway.  Dix-Hallpike positive nystagmus on left.  IMPRESSION: 1.  Benign paroxysmal positional vertigo, left 2.   Migraines without aura, without status migrainosus, not intractable 3.   Excessive daytime sleepiness.  Suspect OSA  PLAN: 1. Refer for vestibular rehab  2. For preventative management: Emgality monthly, zonisamide 65m daily 3.  For abortive therapy, Maxalt-MLT 119m4.  Follow up for sleep study 5.  Limit use of pain relievers to no more than 2 days out of week to prevent risk of rebound or medication-overuse headache. 6.  Keep headache diary 7.  Exercise, hydration, caffeine cessation, sleep hygiene, monitor for and avoid triggers 8.  Follow up 4 to 6 months.   AdMetta ClinesDO  CC:  RaSimona HuhNP

## 2020-02-10 NOTE — Patient Instructions (Signed)

## 2020-02-11 ENCOUNTER — Encounter: Payer: Self-pay | Admitting: Neurology

## 2020-02-11 ENCOUNTER — Other Ambulatory Visit: Payer: Self-pay

## 2020-02-11 ENCOUNTER — Ambulatory Visit (INDEPENDENT_AMBULATORY_CARE_PROVIDER_SITE_OTHER): Payer: 59 | Admitting: Neurology

## 2020-02-11 VITALS — BP 141/85 | HR 92 | Ht 63.0 in | Wt 207.0 lb

## 2020-02-11 DIAGNOSIS — G43009 Migraine without aura, not intractable, without status migrainosus: Secondary | ICD-10-CM | POA: Diagnosis not present

## 2020-02-11 DIAGNOSIS — G4719 Other hypersomnia: Secondary | ICD-10-CM | POA: Diagnosis not present

## 2020-02-11 DIAGNOSIS — H8112 Benign paroxysmal vertigo, left ear: Secondary | ICD-10-CM

## 2020-02-11 MED ORDER — ZONISAMIDE 50 MG PO CAPS: 50.0000 mg | ORAL_CAPSULE | Freq: Every day | ORAL | 5 refills | Status: DC

## 2020-02-11 NOTE — Patient Instructions (Addendum)
1.  Refilled zonisamide, take 50mg  daily 2.  Continue Emgality 3.  Maxalt as needed.  Limit use of pain relievers to no more than 2 days out of week to prevent risk of rebound or medication-overuse headache. 4.  Will send you to physical therapy for vestibular rehabilitation (to help with dizziness) 5.  Follow up in 4 to 6 months.

## 2020-02-14 ENCOUNTER — Ambulatory Visit: Payer: 59 | Admitting: Dietician

## 2020-02-16 ENCOUNTER — Telehealth: Payer: Self-pay | Admitting: Neurology

## 2020-02-16 NOTE — Telephone Encounter (Signed)
Called [facility] to see if they received referral on patient. They did not. Refaxed on 02/16/20.

## 2020-03-01 ENCOUNTER — Other Ambulatory Visit: Payer: Self-pay

## 2020-03-01 ENCOUNTER — Encounter: Payer: Self-pay | Admitting: Endocrinology

## 2020-03-01 ENCOUNTER — Ambulatory Visit (INDEPENDENT_AMBULATORY_CARE_PROVIDER_SITE_OTHER): Payer: 59 | Admitting: Endocrinology

## 2020-03-01 VITALS — BP 148/88 | HR 92 | Ht 63.0 in | Wt 203.6 lb

## 2020-03-01 DIAGNOSIS — E119 Type 2 diabetes mellitus without complications: Secondary | ICD-10-CM

## 2020-03-01 DIAGNOSIS — Z794 Long term (current) use of insulin: Secondary | ICD-10-CM | POA: Diagnosis not present

## 2020-03-01 LAB — POCT GLYCOSYLATED HEMOGLOBIN (HGB A1C): Hemoglobin A1C: 11.1 % — AB (ref 4.0–5.6)

## 2020-03-01 MED ORDER — TOUJEO MAX SOLOSTAR 300 UNIT/ML ~~LOC~~ SOPN: 60.0000 [IU] | PEN_INJECTOR | SUBCUTANEOUS | 3 refills | Status: DC

## 2020-03-01 NOTE — Progress Notes (Signed)
Subjective:    Patient ID: Kendra Hall, female    DOB: Feb 19, 1966, 54 y.o.   MRN: [id number]  HPI Pt returns for f/u of diabetes mellitus:  DM type: Insulin-requiring type 2 Dx'ed: 1740 Complications: renal insuff.    Therapy: Soliqua and Trulicity.   GDM: 1984.   DKA: never.   Severe hypoglycemia: once, in 2018.  Pancreatitis: never.  Other: metformin caused dizziness; she also took insulin 2012-2018.  she did not tolerate invokana (vaginitis); renal insuff limits rx options.   Interval history: No recent steroids. no cbg record, but states cbg's vary from 79-343.  She takes 60 units/d, but she often misses.  She stopped trulicity, due to nausea. 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  She takes synthroid as rx'ed.   Past Medical History:  Diagnosis Date  . Allergy   . Anemia    history of  . Anxiety   . Asthma   . Depression   . Diabetes mellitus    type 2   . Fibromyalgia   . Gastric ulcer   . GERD (gastroesophageal reflux disease)   . Grave's disease   . Graves disease   . H/O therapeutic radiation   . Migraine   . Multiple thyroid nodules   . Myofacial muscle pain   . Osteoarthritis    Facet hypertrophy with injections  . TIA (transient ischemic attack)    02/2015    Past Surgical History:  Procedure Laterality Date  . AXILLARY LYMPH NODE DISSECTION  2001  . CHOLECYSTECTOMY N/A 09/03/2018   Procedure: LAPAROSCOPIC CHOLECYSTECTOMY;  Surgeon: Coralie Keens, MD;  Location: [facility];  Service: General;  Laterality: N/A;  . HYDRADENITIS EXCISION    . TUBAL LIGATION  1995  . VAGINAL HYSTERECTOMY Left 03/11/2017   Procedure: HYSTERECTOMY VAGINAL W/LEFT PROXIMAL SALPINGECTOMY;  Surgeon: Princess Bruins, MD;  Location: [facility];  Service: Gynecology;  Laterality: Left;  request 7:30am OR time  request 2 hours      Social History   Socioeconomic History  . Marital status: Single    Spouse name: Not on  file  . Number of children: 4  . Years of education: 64  . Highest education level: Not on file  Occupational History  . Occupation: Research scientist (physical sciences): [employer]  Tobacco Use  . Smoking status: Former Smoker    Packs/day: 0.50    Years: 30.00    Pack years: 15.00    Types: Cigarettes    Quit date: 04/24/2018    Years since quitting: 1.8  . Smokeless tobacco: Never Used  Vaping Use  . Vaping Use: Never used  Substance and Sexual Activity  . Alcohol use: No    Comment: rare  . 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  Today denies all drug abuse  . Sexual activity: Not Currently    Birth control/protection: Surgical  Other Topics Concern  . Not on file  Social History Narrative   Lives alone in [place]. Her daugther occassoinally comes and stay with her. 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  originally from [place],  [place].  Raised by mom and her sister who is 8 yrs older than pt. Pt has several half siblings. No longer on disability. Pt has  4 kids, never married.          Social Determinants of Health   Financial Resource Strain:   . Difficulty of Paying Living Expenses: Not on file  Food Insecurity:   . Worried About Charity fundraiser in the Last Year: Not on file  . Ran Out of Food in the Last Year: Not on file  Transportation Needs:   . Lack of Transportation (Medical): Not on file  . Lack of Transportation (Non-Medical): Not on file  Physical Activity:   . Days of Exercise per Week: Not on file  . Minutes of Exercise per Session: Not on file  Stress:   . Feeling of Stress : Not on file  Social Connections:   . Frequency of Communication with Friends and Family: Not on file  . Frequency of Social Gatherings with Friends and Family: Not on file  . Attends Religious Services: Not on file  .  Active Member of Clubs or Organizations: Not on file  . Attends Archivist Meetings: Not on file  . Marital Status: Not on file  Intimate Partner Violence:   . Fear of Current or Ex-Partner: Not on file  . Emotionally Abused: Not on file  . Physically Abused: Not on file  . Sexually Abused: Not on file    Current Outpatient Medications on File Prior to Visit  Medication Sig Dispense Refill  . albuterol (PROAIR HFA) 108 (90 Base) MCG/ACT inhaler Inhale 2 puffs into the lungs every 4 (four) hours as needed for wheezing. 1 Inhaler 2  . albuterol (PROVENTIL) (2.5 MG/3ML) 0.083% nebulizer solution Take 3 mLs by nebulization 4 (four) times daily as needed for wheezing or shortness of breath.   3  . amLODipine (NORVASC) 2.5 MG tablet Take 2.5 mg by mouth daily.    . baclofen (LIORESAL) 10 MG tablet TAKE 1 TABLET BY MOUTH TWICE DAILY. CAUTION FOR DROWSINESS 60 tablet 0  . BD PEN NEEDLE NANO 2ND GEN 32G X 4 MM MISC USE TO INJECT INSULIN THREE TIMES DAILY 100 each 0  . Blood Glucose Calibration (ONETOUCH VERIO) SOLN Used to check meter accuracy (Patient taking differently: 1 each by Other route as needed. Use to calibrate meter prn) 1 each 0  . Blood Glucose Monitoring Suppl (ONETOUCH VERIO) w/Device KIT 1 each by Other route 2 (two) times daily.     . cimetidine (TAGAMET) 200 MG tablet Take 200 mg by mouth 2 (two) times daily.     . DULoxetine (CYMBALTA) 60 MG capsule Take 60 mg by mouth 2 (two) times daily.    . Galcanezumab-gnlm (EMGALITY) 120 MG/ML SOAJ Inject 120 mg into the skin every 30 (thirty) days. 1 pen 11  . glucose blood (ONETOUCH VERIO) test strip 1 each by Other route 2 (two) times daily. E11.9 200 strip 0  . hydrOXYzine (VISTARIL) 25 MG capsule Take 25 mg by mouth 3 (three) times daily as needed for itching.    . insulin glargine (LANTUS SOLOSTAR) 100 UNIT/ML Solostar Pen Inject 20 Units into the skin every morning. And pen needles 1/day 10 pen PRN  . Lancets (ONETOUCH DELICA  PLUS [id number]) MISC USE FOUR TIMES DAILY AS DIRECTED (Patient taking differently: 1 each by Other route 2 (two) times daily. ) 500 each 0  . levothyroxine (SYNTHROID) 150 MCG tablet Take 1 tablet (150 mcg total) by mouth daily before breakfast. 90 tablet 3  . lidocaine (LIDODERM) 5 % UNWRAP AND APPLY 1 PATCH TO SKIN DAILY FOR  15 DAYS. REMOVE AND DISCARD PATCH WITHIN 12 HOURS OR AS DIRECTED (Patient taking differently: Place 1 patch onto the skin daily as needed (pain). ) 15 patch 0  . Lifitegrast (XIIDRA) 5 % SOLN Place 1 drop into both eyes daily as needed (dry eyes).     Marland Kitchen linaclotide (LINZESS) 290 MCG CAPS capsule Take 290 mcg by mouth daily before breakfast.     . Magnesium 250 MG TABS Take 1,000 mg by mouth at bedtime.    . montelukast (SINGULAIR) 10 MG tablet Take 10 mg by mouth daily.   3  . omeprazole (PRILOSEC) 40 MG capsule Take 40 mg by mouth 2 (two) times daily.     Marland Kitchen oxyCODONE-acetaminophen (PERCOCET) 7.5-325 MG tablet Take 1 tablet by mouth daily as needed.     . pravastatin (PRAVACHOL) 10 MG tablet Take 10 mg by mouth every evening.     . pregabalin (LYRICA) 75 MG capsule Take 100 mg by mouth 2 (two) times daily. Only takes at night    . promethazine (PHENERGAN) 12.5 MG tablet Take 12.5 mg by mouth every 6 (six) hours as needed for nausea or vomiting.    . rizatriptan (MAXALT-MLT) 10 MG disintegrating tablet DISSOLVE 1 TABLET ON THE TONGUE AT ONSET OF HEADACHE. MAY REPEAT 1 TIME IN 2 HOURS AS NEEDED 9 tablet 3  . Vitamin D, Ergocalciferol, (DRISDOL) 1.25 MG (50000 UT) CAPS capsule Take 50,000 Units by mouth every 7 (seven) days.    Marland Kitchen zonisamide (ZONEGRAN) 50 MG capsule Take 1 capsule (50 mg total) by mouth daily. 30 capsule 5   No current facility-administered medications on file prior to visit.    Allergies  Allergen Reactions  . Ergotrate [Ergonovine] Other (See Comments)    Pt does not ever want  . Ibuprofen Other (See Comments)    Patient has ulcer  . Latex Other (See  Comments)    Breaks out  . Metformin And Related Other (See Comments)    Dizziness  . Yutopar [Ritodrine] Other (See Comments)    Pt does not ever want    Family History  Problem Relation Age of Onset  . Hypertension Mother   . Diabetes Mother   . Cancer Mother        rectal  . Heart disease Father 12       MI x5  . Hypertension Father   . Diabetes Father   . Drug abuse Brother   . Alcohol abuse Brother   . Breast cancer Sister        unsure of age , was between 77 and 72  . Drug abuse Brother   . Alcohol abuse Brother   . Anxiety disorder Neg Hx   . Bipolar disorder Neg Hx   . Depression Neg Hx     BP (!) 148/88   Pulse 92   Ht _0  (1.6 m)   Wt 203 lb 9.6 oz (92.4 kg)   LMP 12/06/2016 (Approximate)   SpO2 95%   BMI 36.07 kg/m     Review of Systems She denies hypoglycemia    Objective:   Physical Exam VITAL SIGNS:  See vs page GENERAL: no distress Pulses: dorsalis pedis intact bilat.   MSK: no deformity of the feet CV: no leg edema Skin:  no ulcer on the feet.  normal color and temp on the feet. Neuro: sensation is intact to touch on the feet.    Lab Results  Component Value Date   HGBA1C 11.1 (A)  03/01/2020      Assessment & Plan:  HTN: is noted today Insulin-requiring type 2 DM, with CRI: uncontrolled  Patient Instructions  Your blood pressure is high today.  Please see your primary care provider soon, to have it rechecked Please take 60 units each morning. I have sent a prescription to your pharmacy, to change to the concentrated type ("Toujeo"). check your blood sugar once a day.  vary the time of day when you check, between before the 3 meals, and at bedtime.  also check if you have symptoms of your blood sugar being too high or too low.  please keep a record of the readings and bring it to your next appointment here (or you can bring the meter itself).  You can write it on any piece of paper.  please call us sooner if your blood sugar goes  below 70, or if you have a lot of readings over 200.   Please come back for a follow-up appointment in 2 months.   '

## 2020-03-01 NOTE — Patient Instructions (Addendum)
Your blood pressure is high today.  Please see your primary care provider soon, to have it rechecked Please take 60 units each morning. I have sent a prescription to your pharmacy, to change to the concentrated type ("Toujeo"). check your blood sugar once a day.  vary the time of day when you check, between before the 3 meals, and at bedtime.  also check if you have symptoms of your blood sugar being too high or too low.  please keep a record of the readings and bring it to your next appointment here (or you can bring the meter itself).  You can write it on any piece of paper.  please call us sooner if your blood sugar goes below 70, or if you have a lot of readings over 200.   Please come back for a follow-up appointment in 2 months.

## 2020-03-06 ENCOUNTER — Ambulatory Visit: Payer: 59 | Admitting: Neurology

## 2020-03-15 ENCOUNTER — Ambulatory Visit (INDEPENDENT_AMBULATORY_CARE_PROVIDER_SITE_OTHER): Payer: 59 | Admitting: Neurology

## 2020-03-15 DIAGNOSIS — R519 Headache, unspecified: Secondary | ICD-10-CM

## 2020-03-15 DIAGNOSIS — G4733 Obstructive sleep apnea (adult) (pediatric): Secondary | ICD-10-CM | POA: Diagnosis not present

## 2020-03-15 DIAGNOSIS — R0683 Snoring: Secondary | ICD-10-CM

## 2020-03-15 DIAGNOSIS — G4719 Other hypersomnia: Secondary | ICD-10-CM

## 2020-03-15 DIAGNOSIS — F518 Other sleep disorders not due to a substance or known physiological condition: Secondary | ICD-10-CM

## 2020-03-15 DIAGNOSIS — G4734 Idiopathic sleep related nonobstructive alveolar hypoventilation: Secondary | ICD-10-CM

## 2020-03-15 DIAGNOSIS — G44011 Episodic cluster headache, intractable: Secondary | ICD-10-CM

## 2020-03-15 DIAGNOSIS — I10 Essential (primary) hypertension: Secondary | ICD-10-CM

## 2020-03-17 ENCOUNTER — Other Ambulatory Visit: Payer: Self-pay | Admitting: Endocrinology

## 2020-03-17 DIAGNOSIS — Z794 Long term (current) use of insulin: Secondary | ICD-10-CM

## 2020-03-17 DIAGNOSIS — E119 Type 2 diabetes mellitus without complications: Secondary | ICD-10-CM

## 2020-03-22 DIAGNOSIS — G4733 Obstructive sleep apnea (adult) (pediatric): Secondary | ICD-10-CM | POA: Insufficient documentation

## 2020-03-22 DIAGNOSIS — G4734 Idiopathic sleep related nonobstructive alveolar hypoventilation: Secondary | ICD-10-CM | POA: Insufficient documentation

## 2020-03-22 NOTE — Progress Notes (Signed)
Summary & Diagnosis:     This HST confirmed a diagnosis of severe sleep apnea, OSA, at AHI  of 61.3/h and REM AHI of 78/h. there was severe, prolonged  hypoxia present at almost 3 hours total desaturation time in  sleep.  Recommendations:    The headaches described by the patient: dull morning headaches  and cluster headaches in sleep, are likely related to the  findings of severe OSA and severe hypoxemia.   The patient should return for an in- laboratory ( attended) PAP  titration, it is likely that she needs BiPAP given the high AHI  and she may need additional oxygen to be used and titrated as  well.   Interpreting Physician: Larey Seat, MD

## 2020-03-22 NOTE — Procedures (Signed)
Sleep Study Report   Patient Information     First Name: Cartha Last Name: Brasington ID: [id number]  Birth Date: [birth year] Age: 54 Gender: Female  Referring Provider: Dr Tomi Likens BMI: 35.4 (W=207 lb, H=5' 4'')  Neck Circ.:  16 '' Epworth:  14/24   Sleep Study Information    Study Date: 03/15/20 S/H/A Version: [id number] / 4.2.1023 / 79  History:    Kendra Hall is a 54 y.o., right-handed African- American female with a medical history of Allergy, Anemia, Anxiety, Asthma, Depression, Diabetes mellitus, Gastric ulcer, GERD (gastroesophageal reflux disease), hypothyroidism-Grave's disease, H/O therapeutic radiation of thyroid-, Multiple thyroid nodules, Myofascial muscle pain, Osteoarthritis, and Morbid Obesity, polysubstance abuse( cocaine) and TIA (transient ischemic attack). She reports excessive fatigue and daytime sleepiness, cluster headaches in sleep, and morning headaches upon waking up. She uses caffeine.    Summary & Diagnosis:      This HST confirmed a diagnosis of severe sleep apnea, OSA, at AHI of 61.3/h and REM AHI of 78/h. there was severe, prolonged hypoxia present at almost 3 hours total desaturation time in sleep. Recommendations:     The headaches described by the patient: dull morning headaches and cluster headaches in sleep, are likely related to the findings of severe OSA and severe hypoxemia.   The patient should return for an in- laboratory ( attended) PAP titration, it is likely that she needs BiPAP given the high AHI and she may need additional oxygen to be used and titrated as well.   Interpreting Physician: Larey Seat, MD                 Sleep Summary  Oxygen Saturation Statistics   Start Study Time: End Study Time: Total Recording Time:          10:05:23 PM 7:29:29 AM   9 h, 24 min  Total Sleep Time % REM of Sleep Time:  7 h, 37 min  31.8    Mean: 90 Minimum: N/A Maximum: 100  Mean of Desaturations Nadirs (%):   84  Oxygen Desaturation. %:  4-9 10-20 >20 Total  Events Number Total  268 91.8  20 6.8  4 1.4  292 100.0  Oxygen Saturation: <90 <=88 <85 <80 <70  Duration (minutes): Sleep % 173.8 165.3 38.0 36.2 120.2 26.3 70.8 15.5 2.3 0.5     Respiratory Indices      Total Events REM NREM All Night  pRDI: pAHI 3%:  393  393 78.7 78.2 52.7 53.0 61.3 61.3  ODI 4%: pAHI 4%:  292 296 70.2 33.4 45.6 46.2       Pulse Rate Statistics during Sleep (BPM)      Mean: 89 Minimum: 36 Maximum: 109     Indices are calculated using technically valid sleep time of 6 h, 24 min.                                                                             pAHI=61.3  Mild              Moderate                    Severe                                                5              15                    30  PAT  Respiratory Events       60  70  80  90  100          Oxygen Saturation: / Pulse Rate (BPM) / PAT Amplitude  SaO2 (%)           20  40  60  80  100  120  140  160  180     Pulse Rate (BPM)          0  200  400  600  800  1000  1200  1400      PAT Amp                         22:0  5   22:3  5   23:0  5   23:3  5  0  0:05  0  0:35  0  1:05  0  1:35  0  2:05  0  2:35  0  3:05  0  3:35  0  4:05  0  4:35  0  5:05  0  5:35  0  6:05  0  6:35  0  7:05      Wake / Sleep stages   L Sleep   D Sleep   Wake   REM

## 2020-03-22 NOTE — Addendum Note (Signed)
Addended by: Larey Seat on: 03/22/2020 12:42 PM   Modules accepted: Orders

## 2020-03-23 ENCOUNTER — Telehealth: Payer: Self-pay

## 2020-03-23 ENCOUNTER — Telehealth: Payer: Self-pay | Admitting: Neurology

## 2020-03-23 DIAGNOSIS — G4719 Other hypersomnia: Secondary | ICD-10-CM

## 2020-03-23 DIAGNOSIS — R0683 Snoring: Secondary | ICD-10-CM

## 2020-03-23 DIAGNOSIS — R519 Headache, unspecified: Secondary | ICD-10-CM

## 2020-03-23 DIAGNOSIS — R5383 Other fatigue: Secondary | ICD-10-CM

## 2020-03-23 DIAGNOSIS — G4733 Obstructive sleep apnea (adult) (pediatric): Secondary | ICD-10-CM

## 2020-03-23 NOTE — Telephone Encounter (Signed)
-----   Message from Larey Seat, MD sent at 03/22/2020 12:42 PM EDT ----- Summary & Diagnosis:     This HST confirmed a diagnosis of severe sleep apnea, OSA, at AHI  of 61.3/h and REM AHI of 78/h. there was severe, prolonged  hypoxia present at almost 3 hours total desaturation time in  sleep.  Recommendations:    The headaches described by the patient: dull morning headaches  and cluster headaches in sleep, are likely related to the  findings of severe OSA and severe hypoxemia.   The patient should return for an in- laboratory ( attended) PAP  titration, it is likely that she needs BiPAP given the high AHI  and she may need additional oxygen to be used and titrated as  well.   Interpreting Physician: Larey Seat, MD

## 2020-03-23 NOTE — Telephone Encounter (Signed)
I called pt. I advised pt that Dr. Brett Fairy reviewed their sleep study results and found that pt has severe sleep apnea. Dr. Brett Fairy recommends that pt starts auto CPAP. I reviewed PAP compliance expectations with the pt. Pt is agreeable to starting a CPAP. I advised pt that an order will be sent to a DME,[facility] and [facility] will call the pt within about one week after they file with the pt's insurance. [facility] will show the pt how to use the machine, fit for masks, and troubleshoot the CPAP if needed. A follow up appt was made for insurance purposes with Debbora Presto, NP on Jan 6,22 at 8 am. Pt verbalized understanding to arrive 15 minutes early and bring their CPAP. A letter with all of this information in it will be mailed to the pt as a reminder. I verified with the pt that the address we have on file is correct. Pt verbalized understanding of results. Pt had no questions at this time but was encouraged to call back if questions arise. I have sent the order to [facility] and have received confirmation that they have received the order.

## 2020-03-23 NOTE — Telephone Encounter (Signed)
Noted verbal order placed for auto CPAP 5-15 cm water pressure, with 2 cm EPR  per Dr Brett Fairy

## 2020-03-23 NOTE — Telephone Encounter (Signed)
[employer] will not approve cpap titration based on the data of HST. Patient will need Auto cpap. Needs to fail auto or have centrals to bring back for cpap titration.

## 2020-03-27 ENCOUNTER — Encounter: Payer: Self-pay | Admitting: Endocrinology

## 2020-03-28 NOTE — Telephone Encounter (Signed)
Increase toujeo to 80 units?

## 2020-03-29 ENCOUNTER — Telehealth: Payer: Self-pay | Admitting: Neurology

## 2020-03-29 NOTE — Telephone Encounter (Signed)
[facility]y [facility]) called, need physician to sign sleep study for 03/15/20 and DME order and fax.

## 2020-03-29 NOTE — Telephone Encounter (Signed)
I have completed this request and faxed to [place].

## 2020-03-31 ENCOUNTER — Telehealth: Payer: Self-pay | Admitting: Neurology

## 2020-03-31 ENCOUNTER — Other Ambulatory Visit: Payer: Self-pay | Admitting: Endocrinology

## 2020-03-31 MED ORDER — TOUJEO MAX SOLOSTAR 300 UNIT/ML ~~LOC~~ SOPN: 80.0000 [IU] | PEN_INJECTOR | SUBCUTANEOUS | 3 refills | Status: DC

## 2020-03-31 NOTE — Telephone Encounter (Signed)
Patient called stating her number this morning was 161 when she checked her sugar. She said the 80 units has been working for her.   Asked if Dr Loanne Drilling could please call in Rx for 80 units for Toujeo to:  [facility], [address] AT [place] [address] [facility]  [address] [place] [address]  Phone:  [phone] Fax:  [phone]

## 2020-03-31 NOTE — Telephone Encounter (Signed)
Advised pt to let [facility] know as well,Since she has an update appt with them coming ina couple of mnths. To see what will be the next steps.   Pt states she will give them a call. I will also send a message over as well.

## 2020-03-31 NOTE — Telephone Encounter (Signed)
Patient called in to report she had her sleep study that Dr. Tomi Likens ordered. She said she was diagnosed with "severe sleep apnea."   Patient further stated, "There is currently a shortage of C-PAP machines. I'd like to know if there's anything your office can do to help expedite me getting a machine? I've been told the wait is about a month."

## 2020-04-01 ENCOUNTER — Encounter: Payer: Self-pay | Admitting: Neurology

## 2020-04-03 ENCOUNTER — Telehealth: Payer: Self-pay | Admitting: Neurology

## 2020-04-03 NOTE — Telephone Encounter (Signed)
Called the patient and advised that after discussing with our sleep lab manager, we have some trial machines that had been donated to our sleep lab. Advised that if she would like we could offer a CPAP that she can temporarily use until she is able to get set up through [facility]. Pt was very appreciative. She will come by the sleep lab tomorrow 04/04/20 at 8 am and meet with Robin Hyler to get set up. The patient was so appreciative.

## 2020-04-03 NOTE — Telephone Encounter (Signed)
*  If patient calls, she was made aware at the time the order was sent that there is a shortage of CPAP's. From our office. Order has been sent to [facility] and they should be getting her scheduled. Unfortunately, this shortage is [place].

## 2020-04-04 ENCOUNTER — Telehealth: Payer: Self-pay

## 2020-04-04 NOTE — Telephone Encounter (Signed)
Patient came to sleep lab for a loaner cpap machine until she can get set up with DME. I gave instructions on use of machine, how to clean, and how to change temperature settings. I fitted her with a Res Med n30i medium. She did well with this mask and had no leaks with pressure of 5. I set her machine at 5-20 auto. Humidity at 4. Patient understood instructions and was thankful for our help.

## 2020-04-08 ENCOUNTER — Other Ambulatory Visit: Payer: 59

## 2020-04-11 ENCOUNTER — Other Ambulatory Visit: Payer: Self-pay | Admitting: Neurology

## 2020-05-29 ENCOUNTER — Encounter: Payer: Self-pay | Admitting: Neurology

## 2020-05-29 NOTE — Progress Notes (Signed)
Kendra Hall Key: [facility] [place] Case ID: [id number] - Rx #: [id number] Need help? Call us at [phone] Outcome Approvedtoday Request Reference Number: [id number]. EMGALITY INJ 120MG /ML is approved through 05/29/2021. Your patient may now fill this prescription and it will be covered. Drug Emgality 120MG /ML auto-injectors (migraine) Form [employer] Electronic Prior Authorization Form (2017 NCPDP) Original Claim Info [phone]

## 2020-06-06 NOTE — Progress Notes (Addendum)
NEUROLOGY FOLLOW UP OFFICE NOTE  [facility] [id number]   Subjective:  Kendra Hall is a 54year old woman with type 2 diabetes mellitus, hypothyroidism, fibromyalgia and depression who follows up for migraines.  UPDATE: Sent to vestibular rehab for BPPV.  Effective but returned 2 weeks ago.  Intensity:  Moderate to severe Duration:  1 to 2 hours Frequency:  1 to 2 days a month Current NSAIDS:none Current analgesics:Hydrocodone most days for fibromyalgia but has been treating headaches with them as well Current triptans:Maxalt MLT 10 mg Current ergotamine:None Current anti-emetic:Promethazine12.82m Current muscle relaxants:baclofen Current anti-anxiolytic:BuSpar Current sleep aide:None Current Antihypertensive medications:Would not use beta-blocker due to asthma Current Antidepressant medications:Cymbalta 675mBID Current Anticonvulsant medications:zonisamide 5039mt bedtime, Lyrica 100m29mD Current anti-CGRP:Emgality  Current Vitamins/Herbal/Supplements:None Current Antihistamines/Decongestants:None Other therapy:None  Caffeine:No Diet:Drinks Exercise:Notroutine Depression:Yes; Anxiety:Yes Other pain:Neck pain, fibromyalgia Sleep hygiene:She underwent sleep study and was diagnosed with severe OSA.  She is using a CPAP.  Sleep is improved, as well as headaches.   HISTORY: Onset: She has had history of migrainessince her 20s,but theybecame frequent in 2018. Location: Varies: back of head, across forehead, temples Quality: Varies: squeezing, sharp Initial Intensity: 10/10 Aura: no Prodrome: no Postdrome: no Associated symptoms:Nausea, photophobia, phonophobia. She denies associated vomiting, visual disturbance, or unilateral numbness or weakness. It is not a new thunderclap headache. Initial Duration: All day (usually lets up in 30 minutes after taking Maxalt, however lately headache returns in 2 hours) Initial  Frequency: Every other day Initial Frequency of abortive medication: every other day Triggers: None Relieving factors: Sleep Activity: aggravates  In July 2021, she began waking up every morning with a headache, a severe squeezing on the top of her head. It tends to happen if she wakes up from a vivid dream. If she takes Maxalt, it aborts in 1 to 2 hours, otherwise it would last 1/2 a day. She has had a change in her sleep schedule beginning a couple of weeks ago in which she started sleeping during the day and has been up at night. She reports that she has had a formal eye exam this past year which was unremarkable.  Past NSAIDS/steroids: ibuprofen, prednisone (unable to take0 Past analgesics: Tylenol, Excedrin Migraine Past abortive triptans: Sumatriptan tablet Past muscle relaxants: no Past anti-emetic: Zofran Past antihypertensive medications: no Past antidepressant medications: sertraline 50mg26mtriptyline 75mg 32meeded for sleep (cannot take nightly due to extreme dry mouth), Lexapro 20mg P9manticonvulsant medications: topiramate immediate release 100mg (c47mtive problems),Topiramate ER 100 mg (effective) Past vitamins/Herbal/Supplements: no Other past therapies: no  Family history of headache: cousins  MRI and MRA of head from 03/15/15 were personally reviewed and were unremarkable except for minimal punctate white matter foci.  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 CT cervical spine from 01/09/18 showed cervical spondylosis with degenerative changesgreatest at C4-C7 levels with bilateral neural foraminal stenosis, as well as mild multifactorial C5-6 canal stenosis. She is treated by pain management.  PAST MEDICAL HISTORY: Past Medical History:  Diagnosis Date  . Allergy   . Anemia    history of  . Anxiety   . Asthma   . Depression   . Diabetes mellitus    type 2   . Fibromyalgia   . Gastric  ulcer   . GERD (gastroesophageal reflux disease)   . Grave's disease   . Graves disease   . H/O therapeutic radiation   . Migraine   . Multiple thyroid nodules   .  Myofacial muscle pain   . Osteoarthritis    Facet hypertrophy with injections  . TIA (transient ischemic attack)    02/2015    MEDICATIONS: Current Outpatient Medications on File Prior to Visit  Medication Sig Dispense Refill  . albuterol (PROAIR HFA) 108 (90 Base) MCG/ACT inhaler Inhale 2 puffs into the lungs every 4 (four) hours as needed for wheezing. 1 Inhaler 2  . albuterol (PROVENTIL) (2.5 MG/3ML) 0.083% nebulizer solution Take 3 mLs by nebulization 4 (four) times daily as needed for wheezing or shortness of breath.   3  . amLODipine (NORVASC) 2.5 MG tablet Take 2.5 mg by mouth daily.    . baclofen (LIORESAL) 10 MG tablet TAKE 1 TABLET BY MOUTH TWICE DAILY. CAUTION FOR DROWSINESS 60 tablet 0  . BD PEN NEEDLE NANO 2ND GEN 32G X 4 MM MISC USE TO INJECT INSULIN THREE TIMES DAILY 100 each 0  . Blood Glucose Calibration (ONETOUCH VERIO) SOLN Used to check meter accuracy (Patient taking differently: 1 each by Other route as needed. Use to calibrate meter prn) 1 each 0  . Blood Glucose Monitoring Suppl (ONETOUCH VERIO) w/Device KIT 1 each by Other route 2 (two) times daily.     . cimetidine (TAGAMET) 200 MG tablet Take 200 mg by mouth 2 (two) times daily.     . DULoxetine (CYMBALTA) 60 MG capsule Take 60 mg by mouth 2 (two) times daily.    . Galcanezumab-gnlm (EMGALITY) 120 MG/ML SOAJ Inject 120 mg into the skin every 30 (thirty) days. 1 pen 11  . hydrOXYzine (VISTARIL) 25 MG capsule Take 25 mg by mouth 3 (three) times daily as needed for itching.    . insulin glargine, 2 Unit Dial, (TOUJEO MAX SOLOSTAR) 300 UNIT/ML Solostar Pen Inject 80 Units into the skin every morning. 84 mL 3  . Lancets (ONETOUCH DELICA PLUS [id number]) MISC USE FOUR TIMES DAILY AS DIRECTED (Patient taking differently: 1 each by Other route 2 (two) times  daily. ) 500 each 0  . levothyroxine (SYNTHROID) 150 MCG tablet Take 1 tablet (150 mcg total) by mouth daily before breakfast. 90 tablet 3  . lidocaine (LIDODERM) 5 % UNWRAP AND APPLY 1 PATCH TO SKIN DAILY FOR 15 DAYS. REMOVE AND DISCARD PATCH WITHIN 12 HOURS OR AS DIRECTED (Patient taking differently: Place 1 patch onto the skin daily as needed (pain). ) 15 patch 0  . Lifitegrast (XIIDRA) 5 % SOLN Place 1 drop into both eyes daily as needed (dry eyes).     Marland Kitchen linaclotide (LINZESS) 290 MCG CAPS capsule Take 290 mcg by mouth daily before breakfast.     . Magnesium 250 MG TABS Take 1,000 mg by mouth at bedtime.    . montelukast (SINGULAIR) 10 MG tablet Take 10 mg by mouth daily.   3  . omeprazole (PRILOSEC) 40 MG capsule Take 40 mg by mouth 2 (two) times daily.     Glory Rosebush VERIO test strip USE TO CHECK BLOOD SUGAR TWICE DAILY 200 strip 0  . oxyCODONE-acetaminophen (PERCOCET) 7.5-325 MG tablet Take 1 tablet by mouth daily as needed.     . pravastatin (PRAVACHOL) 10 MG tablet Take 10 mg by mouth every evening.     . pregabalin (LYRICA) 75 MG capsule Take 100 mg by mouth 2 (two) times daily. Only takes at night    . promethazine (PHENERGAN) 12.5 MG tablet Take 12.5 mg by mouth every 6 (six) hours as needed for nausea or vomiting.    . rizatriptan (MAXALT-MLT) 10  MG disintegrating tablet DISSOLVE 1 TABLET ON THE TONGUE AT ONSET OF HEADACHE. MAY REPEAT 1 TIME IN 2 HOURS AS NEEDED 9 tablet 3  . Vitamin D, Ergocalciferol, (DRISDOL) 1.25 MG (50000 UT) CAPS capsule Take 50,000 Units by mouth every 7 (seven) days.    Marland Kitchen zonisamide (ZONEGRAN) 50 MG capsule Take 1 capsule (50 mg total) by mouth daily. 30 capsule 5   No current facility-administered medications on file prior to visit.    ALLERGIES: Allergies  Allergen Reactions  . Ergotrate [Ergonovine] Other (See Comments)    Pt does not ever want  . Ibuprofen Other (See Comments)    Patient has ulcer  . Latex Other (See Comments)    Breaks out  .  Metformin And Related Other (See Comments)    Dizziness  . Yutopar [Ritodrine] Other (See Comments)    Pt does not ever want    FAMILY HISTORY: Family History  Problem Relation Age of Onset  . Hypertension Mother   . Diabetes Mother   . Cancer Mother        rectal  . Heart disease Father 51       MI x5  . Hypertension Father   . Diabetes Father   . Drug abuse Brother   . Alcohol abuse Brother   . Breast cancer Sister        unsure of age , was between 40 and 68  . Drug abuse Brother   . Alcohol abuse Brother   . Anxiety disorder Neg Hx   . Bipolar disorder Neg Hx   . Depression Neg Hx     SOCIAL HISTORY: Social History   Socioeconomic History  . Marital status: Single    Spouse name: Not on file  . Number of children: 4  . Years of education: 66  . Highest education level: Not on file  Occupational History  . Occupation: Research scientist (physical sciences): [employer]  Tobacco Use  . Smoking status: Former Smoker    Packs/day: 0.50    Years: 30.00    Pack years: 15.00    Types: Cigarettes    Quit date: 04/24/2018    Years since quitting: 2.1  . Smokeless tobacco: Never Used  Vaping Use  . Vaping Use: Never used  Substance and Sexual Activity  . Alcohol use: No    Comment: rare  . Drug use: No    Comment: cocaine use daily for 3 yrs in her 28's. 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  Today denies all drug abuse  . Sexual activity: Not Currently    Birth control/protection: Surgical  Other Topics Concern  . Not on file  Social History Narrative   Lives alone in [place]. Her daugther occassoinally comes and stay with her. 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  originally from [place],  [place].  Raised by mom and her sister who is 8 yrs older than pt. Pt has several half siblings. No longer on disability. Pt has 4 kids, never married.          Social Determinants of  Health   Financial Resource Strain: Not on file  Food Insecurity: Not on file  Transportation Needs: Not on file  Physical Activity: Not on file  Stress: Not on file  Social Connections: Not on file  Intimate Partner Violence: Not on file     Objective:  Blood  pressure (!) 161/95, pulse 88, height '5\' 7"'  (1.702 m), weight 212 lb 12.8 oz (96.5 kg), last menstrual period 12/06/2016, SpO2 98 %. General: No acute distress.  Patient appears well-groomed Head:  Normocephalic/atraumatic Eyes:  Fundi examined but not visualized Neck: supple, no paraspinal tenderness, full range of motion Heart:  Regular rate and rhythm Lungs:  Clear to auscultation bilaterally Back: No paraspinal tenderness Neurological Exam: alert and oriented to person, place, and time. Attention span and concentration intact, recent and remote memory intact, fund of knowledge intact.  Speech fluent and not dysarthric, language intact.  End-gaze nystagmus looking to left on tracking.  Otherwise, CN II-XII intact. Bulk and tone normal, muscle strength 5/5 throughout.  Sensation to light touch, temperature and vibration intact.  Deep tendon reflexes 2+ throughout, toes downgoing.  Finger to nose and heel to shin testing intact.  Gait normal, Romberg negative.   Assessment/Plan:   1.  Migraine without aura, without status migrainosus, not intractable 2.  Obstructive sleep apnea 3.  Left BPPV 4.  HTN  Performed Epley maneuver today 1.  Migraine prevention:  Emgality monthly; zonisamide 2.  Migraine rescue:  Rizatriptan 47m  3.  Use CPAP 4.  Follow Epley maneuver 3 times daily until resolved 5.  Limit use of pain relievers to no more than 2 days out of week to prevent risk of rebound or medication-overuse headache. 6.  Keep headache diary 7.  Follow up BP with PCP 8.  Follow up in 9 months  AMetta Clines DO  CC: RSimona Huh NP

## 2020-06-07 ENCOUNTER — Ambulatory Visit (INDEPENDENT_AMBULATORY_CARE_PROVIDER_SITE_OTHER): Payer: 59 | Admitting: Neurology

## 2020-06-07 ENCOUNTER — Encounter: Payer: Self-pay | Admitting: Neurology

## 2020-06-07 ENCOUNTER — Other Ambulatory Visit: Payer: Self-pay

## 2020-06-07 VITALS — BP 161/95 | HR 88 | Ht 67.0 in | Wt 212.8 lb

## 2020-06-07 DIAGNOSIS — G4733 Obstructive sleep apnea (adult) (pediatric): Secondary | ICD-10-CM | POA: Diagnosis not present

## 2020-06-07 DIAGNOSIS — I10 Essential (primary) hypertension: Secondary | ICD-10-CM

## 2020-06-07 DIAGNOSIS — G43009 Migraine without aura, not intractable, without status migrainosus: Secondary | ICD-10-CM | POA: Diagnosis not present

## 2020-06-07 DIAGNOSIS — H8112 Benign paroxysmal vertigo, left ear: Secondary | ICD-10-CM

## 2020-06-07 NOTE — Patient Instructions (Signed)
  1. Follow up with your PCP regarding blood pressure 2. Follow directions for Epley maneuver as directed 3. Emgality monthly 4. Take rizatriptan 10mg  at earliest onset of headache.  May repeat dose once in 2 hours if needed.  Maximum 2 tablets in 24 hours. 5. Limit use of pain relievers to no more than 2 days out of the week.  These medications include acetaminophen, NSAIDs (ibuprofen/Advil/Motrin, naproxen/Aleve, triptans (Imitrex/sumatriptan), Excedrin, and narcotics.  This will help reduce risk of rebound headaches. 6. Be aware of common food triggers:  - Caffeine:  coffee, black tea, cola, Mt. Dew  - Chocolate  - Dairy:  aged cheeses (brie, blue, cheddar, gouda, [place], provolone, [place], Swiss, etc), chocolate milk, buttermilk, sour cream, limit eggs and yogurt  - Nuts, peanut butter  - Alcohol  - Cereals/grains:  FRESH breads (fresh bagels, sourdough, doughnuts), yeast productions  - Processed/canned/aged/cured meats (pre-packaged deli meats, hotdogs)  - MSG/glutamate:  soy sauce, flavor enhancer, pickled/preserved/marinated foods  - Sweeteners:  aspartame (Equal, Nutrasweet).  Sugar and Splenda are okay  - Vegetables:  legumes (lima beans, lentils, snow peas, fava beans, pinto peans, peas, garbanzo beans), sauerkraut, onions, olives, pickles  - Fruit:  avocados, bananas, citrus fruit (orange, lemon, grapefruit), mango  - Other:  Frozen meals, macaroni and cheese 7. Routine exercise 8. Stay adequately hydrated (aim for 64 oz water daily) 9. Keep headache diary 10. Maintain proper stress management 11. Maintain proper sleep hygiene 12. Do not skip meals 13. Consider supplements:  magnesium citrate 400mg  daily, riboflavin 400mg  daily, coenzyme Q10 100mg  three times daily.

## 2020-06-29 ENCOUNTER — Ambulatory Visit: Payer: 59 | Admitting: Family Medicine

## 2020-07-06 ENCOUNTER — Other Ambulatory Visit: Payer: Self-pay | Admitting: Endocrinology

## 2020-09-04 ENCOUNTER — Other Ambulatory Visit: Payer: Self-pay

## 2020-09-04 ENCOUNTER — Other Ambulatory Visit: Payer: Self-pay | Admitting: Endocrinology

## 2020-09-04 DIAGNOSIS — E119 Type 2 diabetes mellitus without complications: Secondary | ICD-10-CM

## 2020-09-04 DIAGNOSIS — Z794 Long term (current) use of insulin: Secondary | ICD-10-CM

## 2020-09-04 MED ORDER — ONETOUCH VERIO VI STRP: ORAL_STRIP | 0 refills | Status: DC

## 2020-09-04 MED ORDER — ONETOUCH DELICA PLUS LANCET33G MISC: 0 refills | Status: DC

## 2020-09-11 ENCOUNTER — Encounter (HOSPITAL_COMMUNITY): Payer: Self-pay

## 2020-09-11 ENCOUNTER — Emergency Department (HOSPITAL_COMMUNITY): Payer: 59

## 2020-09-11 ENCOUNTER — Emergency Department (HOSPITAL_COMMUNITY)
Admission: EM | Admit: 2020-09-11 | Discharge: 2020-09-11 | Disposition: A | Discharge status: Home / Self Care | Payer: 59 | Attending: Emergency Medicine | Admitting: Emergency Medicine

## 2020-09-11 ENCOUNTER — Other Ambulatory Visit: Payer: Self-pay

## 2020-09-11 DIAGNOSIS — Z87891 Personal history of nicotine dependence: Secondary | ICD-10-CM | POA: Diagnosis not present

## 2020-09-11 DIAGNOSIS — E119 Type 2 diabetes mellitus without complications: Secondary | ICD-10-CM | POA: Insufficient documentation

## 2020-09-11 DIAGNOSIS — R4781 Slurred speech: Secondary | ICD-10-CM | POA: Diagnosis not present

## 2020-09-11 DIAGNOSIS — Z91040 Latex allergy status: Secondary | ICD-10-CM | POA: Diagnosis not present

## 2020-09-11 DIAGNOSIS — J45909 Unspecified asthma, uncomplicated: Secondary | ICD-10-CM | POA: Insufficient documentation

## 2020-09-11 DIAGNOSIS — Z79899 Other long term (current) drug therapy: Secondary | ICD-10-CM | POA: Insufficient documentation

## 2020-09-11 DIAGNOSIS — I10 Essential (primary) hypertension: Secondary | ICD-10-CM | POA: Diagnosis not present

## 2020-09-11 DIAGNOSIS — Z794 Long term (current) use of insulin: Secondary | ICD-10-CM | POA: Insufficient documentation

## 2020-09-11 DIAGNOSIS — E039 Hypothyroidism, unspecified: Secondary | ICD-10-CM | POA: Diagnosis not present

## 2020-09-11 DIAGNOSIS — R253 Fasciculation: Secondary | ICD-10-CM | POA: Diagnosis not present

## 2020-09-11 DIAGNOSIS — G514 Facial myokymia: Secondary | ICD-10-CM

## 2020-09-11 LAB — COMPREHENSIVE METABOLIC PANEL
ALT: 24 U/L (ref 0–44)
AST: 21 U/L (ref 15–41)
Albumin: 3.9 g/dL (ref 3.5–5.0)
Alkaline Phosphatase: 188 U/L — ABNORMAL HIGH (ref 38–126)
Anion gap: 11 (ref 5–15)
BUN: 15 mg/dL (ref 6–20)
CO2: 24 mmol/L (ref 22–32)
Calcium: 9.1 mg/dL (ref 8.9–10.3)
Chloride: 98 mmol/L (ref 98–111)
Creatinine, Ser: 1.01 mg/dL — ABNORMAL HIGH (ref 0.44–1.00)
GFR, Estimated: >60 mL/min (ref >60)
Glucose, Bld: 406 mg/dL — ABNORMAL HIGH (ref 70–99)
Potassium: 3.9 mmol/L (ref 3.5–5.1)
Sodium: 133 mmol/L — ABNORMAL LOW (ref 135–145)
Total Bilirubin: 0.9 mg/dL (ref 0.3–1.2)
Total Protein: 7.5 g/dL (ref 6.5–8.1)

## 2020-09-11 LAB — CBC
HCT: 46.9 % — ABNORMAL HIGH (ref 36.0–46.0)
Hemoglobin: 15.6 g/dL — ABNORMAL HIGH (ref 12.0–15.0)
MCH: 27.6 pg (ref 26.0–34.0)
MCHC: 33.3 g/dL (ref 30.0–36.0)
MCV: 82.9 fL (ref 80.0–100.0)
Platelets: 241 10*3/uL (ref 150–400)
RBC: 5.66 MIL/uL — ABNORMAL HIGH (ref 3.87–5.11)
RDW: 13.7 % (ref 11.5–15.5)
WBC: 7.7 10*3/uL (ref 4.0–10.5)
nRBC: 0 % (ref 0.0–0.2)

## 2020-09-11 LAB — DIFFERENTIAL
Abs Immature Granulocytes: 0.02 10*3/uL (ref 0.00–0.07)
Basophils Absolute: 0 10*3/uL (ref 0.0–0.1)
Basophils Relative: 0 %
Eosinophils Absolute: 0.1 10*3/uL (ref 0.0–0.5)
Eosinophils Relative: 1 %
Immature Granulocytes: 0 %
Lymphocytes Relative: 29 %
Lymphs Abs: 2.3 10*3/uL (ref 0.7–4.0)
Monocytes Absolute: 0.6 10*3/uL (ref 0.1–1.0)
Monocytes Relative: 8 %
Neutro Abs: 4.7 10*3/uL (ref 1.7–7.7)
Neutrophils Relative %: 62 %

## 2020-09-11 LAB — ETHANOL: Alcohol, Ethyl (B): <10 mg/dL (ref <10)

## 2020-09-11 LAB — CBG MONITORING, ED: Glucose-Capillary: 444 mg/dL — ABNORMAL HIGH (ref 70–99)

## 2020-09-11 LAB — PROTIME-INR
INR: 1 (ref 0.8–1.2)
Prothrombin Time: 13.1 seconds (ref 11.4–15.2)

## 2020-09-11 LAB — APTT: aPTT: 28 seconds (ref 24–36)

## 2020-09-11 NOTE — ED Notes (Signed)
Patient is able to drink water with no issues

## 2020-09-11 NOTE — ED Triage Notes (Signed)
Pt reports intermittent slurred speech and aphasia over the past few days. Pt states she felt her face droop twice today around 430p, but no facial droop noted at this time. Pt reports decreased sensation on left side. Bilateral grips and strength equal. MD assessed pt in triage.

## 2020-09-11 NOTE — ED Notes (Signed)
Patient is being transported to MRI. 

## 2020-09-11 NOTE — ED Provider Notes (Signed)
[facility] DEPT Provider Note   CSN: [id number] Arrival date & time: 09/11/20  1812     History Chief Complaint  Patient presents with  . Aphasia    Kendra Hall is a 55 y.o. female.  55 yo F with a chief complaints of slurred speech.  This been going on for past couple days off and on.  She had episode today where she felt like the left side of her mouth spasms for a second or less.  Has not resolved.  Occurred about an hour or 2 ago.  She called her physician who told her to come to the ED immediately to be seen.  She denies any numbness or weakness to the arms or legs denies difficulty speech or swallowing.  Denies headache or head injury.  She does have a history of a TIA in the past.  The history is provided by the patient.  Illness Severity:  Moderate Onset quality:  Gradual Duration:  2 days Timing:  Constant Progression:  Worsening Chronicity:  New Associated symptoms: no chest pain, no congestion, no fever, no headaches, no myalgias, no nausea, no rhinorrhea, no shortness of breath, no vomiting and no wheezing        Past Medical History:  Diagnosis Date  . Allergy   . Anemia    history of  . Anxiety   . Asthma   . Depression   . Diabetes mellitus    type 2   . Fibromyalgia   . Gastric ulcer   . GERD (gastroesophageal reflux disease)   . Grave's disease   . Graves disease   . H/O therapeutic radiation   . Migraine   . Multiple thyroid nodules   . Myofacial muscle pain   . Osteoarthritis    Facet hypertrophy with injections  . TIA (transient ischemic attack)    02/2015    Patient Active Problem List   Diagnosis Date Noted  . Severe obstructive sleep apnea-hypopnea syndrome 03/22/2020  . Sleep-related hypoxia 03/22/2020  . Intractable episodic cluster headache 02/10/2020  . Abnormal dreams 02/10/2020  . Excessive daytime sleepiness 02/10/2020  . Snoring 02/10/2020  . Sleep related headaches 02/10/2020  . Lt facial  numbness 09/25/2017  . Back pain 06/15/2017  . Postoperative state 03/11/2017  . Trigger point 11/28/2016  . Health care maintenance 02/04/2016  . Severe episode of recurrent major depressive disorder, with psychotic features ([place]) 08/22/2015  . GAD (generalized anxiety disorder) 08/22/2015  . PTSD (post-traumatic stress disorder) 08/22/2015  . Insomnia 08/22/2015  . GERD (gastroesophageal reflux disease) 04/18/2015  . Fibromyalgia 03/29/2015  . Type 2 diabetes mellitus without complication ([place])   . TIA (transient ischemic attack) 03/15/2015  . Mild intermittent asthma 02/28/2012  . Anxiety disorder 02/07/2012  . Hypertension 01/07/2012  . Hypothyroidism following radioiodine therapy 08/28/2011  . Grave's disease 05/07/2011  . Family hx-breast malignancy 11/20/2010  . Chronic migraine without aura 05/04/2010  . Left shoulder pain 09/08/2009  . TOBACCO USER 03/11/2009  . Notalgia 01/09/2009  . OBESITY, UNSPECIFIED 09/27/2008  . Mood disorder ([place]) 09/22/2008    Past Surgical History:  Procedure Laterality Date  . AXILLARY LYMPH NODE DISSECTION  2001  . CHOLECYSTECTOMY N/A 09/03/2018   Procedure: LAPAROSCOPIC CHOLECYSTECTOMY;  Surgeon: Coralie Keens, MD;  Location: [facility];  Service: General;  Laterality: N/A;  . HYDRADENITIS EXCISION    . TUBAL LIGATION  1995  . VAGINAL HYSTERECTOMY Left 03/11/2017   Procedure: HYSTERECTOMY VAGINAL W/LEFT PROXIMAL SALPINGECTOMY;  Surgeon:  Princess Bruins, MD;  Location: [facility];  Service: Gynecology;  Laterality: Left;  request 7:30am OR time  request 2 hours       OB History    Gravida  5   Para      Term      Preterm      AB  2   Living  4     SAB      IAB      Ectopic  0   Multiple      Live Births              Family History  Problem Relation Age of Onset  . Hypertension Mother   . Diabetes Mother   . Cancer Mother        rectal  . Heart disease Father 68       MI x5  . Hypertension Father   .  Diabetes Father   . Drug abuse Brother   . Alcohol abuse Brother   . Breast cancer Sister        unsure of age , was between 59 and 16  . Drug abuse Brother   . Alcohol abuse Brother   . Anxiety disorder Neg Hx   . Bipolar disorder Neg Hx   . Depression Neg Hx     Social History   Tobacco Use  . Smoking status: Former Smoker    Packs/day: 0.50    Years: 30.00    Pack years: 15.00    Types: Cigarettes    Quit date: 04/24/2018    Years since quitting: 2.3  . Smokeless tobacco: Never Used  Vaping Use  . Vaping Use: Never used  Substance Use Topics  . Alcohol use: No    Comment: rare  . 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  Today denies all drug abuse    Home Medications Prior to Admission medications   Medication Sig Start Date End Date Taking? Authorizing Provider  albuterol (PROAIR HFA) 108 (90 Base) MCG/ACT inhaler Inhale 2 puffs into the lungs every 4 (four) hours as needed for wheezing. 07/31/17  Yes Nicolette Bang, DO  albuterol (PROVENTIL) (2.5 MG/3ML) 0.083% nebulizer solution Take 3 mLs by nebulization 4 (four) times daily as needed for wheezing or shortness of breath.  03/02/18  Yes [provider]  amLODipine (NORVASC) 2.5 MG tablet Take 2.5 mg by mouth daily. 08/11/19  Yes [provider]  baclofen (LIORESAL) 10 MG tablet TAKE 1 TABLET BY MOUTH TWICE DAILY. CAUTION FOR DROWSINESS Patient taking differently: Take 10 mg by mouth daily as needed for muscle spasms. 02/08/20  Yes Jaffe, Adam R, DO  DULoxetine (CYMBALTA) 60 MG capsule Take 60 mg by mouth 2 (two) times daily. 08/09/19  Yes [provider]  Galcanezumab-gnlm (EMGALITY) 120 MG/ML SOAJ Inject 120 mg into the skin every 30 (thirty) days. 10/06/19  Yes Jaffe, Adam R, DO  hydrOXYzine (VISTARIL) 25 MG capsule Take 25 mg by mouth 3 (three) times daily as needed for itching.   Yes [provider]   insulin glargine, 2 Unit Dial, (TOUJEO MAX SOLOSTAR) 300 UNIT/ML Solostar Pen Inject 80 Units into the skin every morning. 03/31/20  Yes Renato Shin, MD  lamoTRIgine (LAMICTAL) 25 MG tablet Take 50 mg by mouth daily. 06/06/20  Yes [provider]  levothyroxine (SYNTHROID) 150 MCG tablet TAKE 1 TABLET(150  MCG) BY MOUTH DAILY BEFORE BREAKFAST Patient taking differently: Take 150 mcg by mouth daily before breakfast. 07/07/20  Yes Renato Shin, MD  lidocaine (LIDODERM) 5 % UNWRAP AND APPLY 1 PATCH TO SKIN DAILY FOR 15 DAYS. REMOVE AND DISCARD PATCH WITHIN 12 HOURS OR AS DIRECTED Patient taking differently: Place 1 patch onto the skin daily as needed (pain). 02/09/18  Yes Sherene Sires, DO  linaclotide (LINZESS) 290 MCG CAPS capsule Take 290 mcg by mouth daily before breakfast.    Yes [provider]  Magnesium 250 MG TABS Take 750 mg by mouth at bedtime.   Yes [provider]  montelukast (SINGULAIR) 10 MG tablet Take 10 mg by mouth daily.  03/16/18  Yes [provider]  omeprazole (PRILOSEC) 40 MG capsule Take 40 mg by mouth 2 (two) times daily.    Yes [provider]  pravastatin (PRAVACHOL) 20 MG tablet Take 20 mg by mouth daily.   Yes [provider]  pregabalin (LYRICA) 50 MG capsule Take 100 mg by mouth daily.   Yes [provider]  promethazine (PHENERGAN) 12.5 MG tablet Take 12.5 mg by mouth every 6 (six) hours as needed for nausea or vomiting.   Yes [provider]  rizatriptan (MAXALT-MLT) 10 MG disintegrating tablet DISSOLVE 1 TABLET ON THE TONGUE AT ONSET OF HEADACHE. MAY REPEAT 1 TIME IN 2 HOURS AS NEEDED Patient taking differently: Take 10 mg by mouth daily as needed for migraine. 01/05/20  Yes Jaffe, Adam R, DO  BD PEN NEEDLE NANO 2ND GEN 32G X 4 MM MISC USE TO INJECT INSULIN THREE TIMES DAILY 03/23/19   Renato Shin, MD  Blood Glucose Calibration (ONETOUCH VERIO) SOLN Used to check meter accuracy Patient taking  differently: 1 each by Other route as needed. Use to calibrate meter prn 02/26/17   Renato Shin, MD  Blood Glucose Monitoring [facility] VERIO) w/Device KIT 1 each by Other route 2 (two) times daily.  03/23/18   [provider]  glucose blood (ONETOUCH VERIO) test strip Check your blood sugar once a day.  vary the time of day when you check, between before the 3 meals, and at bedtime 09/04/20   Renato Shin, MD  Lancets (ONETOUCH DELICA PLUS [id number]) [place] as directed 09/04/20   Renato Shin, MD  zonisamide (ZONEGRAN) 50 MG capsule Take 1 capsule (50 mg total) by mouth daily. Patient not taking: Reported on 09/11/2020 02/11/20   Pieter Partridge, DO    Allergies    Eggs or egg-derived products, Ergotrate [ergonovine], Ibuprofen, Latex, Metformin and related, and Yutopar [ritodrine]  Review of Systems   Review of Systems  Constitutional: Negative for chills and fever.  HENT: Negative for congestion and rhinorrhea.   Eyes: Negative for redness and visual disturbance.  Respiratory: Negative for shortness of breath and wheezing.   Cardiovascular: Negative for chest pain and palpitations.  Gastrointestinal: Negative for nausea and vomiting.  Genitourinary: Negative for dysuria and urgency.  Musculoskeletal: Negative for arthralgias and myalgias.  Skin: Negative for pallor and wound.  Neurological: Positive for speech difficulty. Negative for dizziness and headaches.    Physical Exam Updated Vital Signs BP 98/86   Pulse 78   Temp 98.6 F (37 C) (Oral)   Resp 15   LMP 12/06/2016 (Approximate)   SpO2 100%   Physical Exam Vitals and nursing note reviewed.  Constitutional:      General: She is not in acute distress.    Appearance: She is well-developed. She is not  diaphoretic.  HENT:     Head: Normocephalic and atraumatic.  Eyes:     Pupils: Pupils are equal, round, and reactive to light.  Cardiovascular:     Rate and Rhythm: Normal rate and regular rhythm.     Heart  sounds: No murmur heard. No friction rub. No gallop.   Pulmonary:     Effort: Pulmonary effort is normal.     Breath sounds: No wheezing or rales.  Abdominal:     General: There is no distension.     Palpations: Abdomen is soft.     Tenderness: There is no abdominal tenderness.  Musculoskeletal:        General: No tenderness.     Cervical back: Normal range of motion and neck supple.  Skin:    General: Skin is warm and dry.  Neurological:     Mental Status: She is alert and oriented to person, place, and time.     GCS: GCS eye subscore is 4. GCS verbal subscore is 5. GCS motor subscore is 6.     Cranial Nerves: Cranial nerves are intact.     Sensory: Sensation is intact.     Motor: Motor function is intact.     Coordination: Coordination is intact.     Gait: Gait is intact.  Psychiatric:        Behavior: Behavior normal.     ED Results / Procedures / Treatments   Labs (all labs ordered are listed, but only abnormal results are displayed) Labs Reviewed  CBC - Abnormal; Notable for the following components:      Result Value   RBC 5.66 (*)    Hemoglobin 15.6 (*)    HCT 46.9 (*)    All other components within normal limits  COMPREHENSIVE METABOLIC PANEL - Abnormal; Notable for the following components:   Sodium 133 (*)    Glucose, Bld 406 (*)    Creatinine, Ser 1.01 (*)    Alkaline Phosphatase 188 (*)    All other components within normal limits  CBG MONITORING, ED - Abnormal; Notable for the following components:   Glucose-Capillary 444 (*)    All other components within normal limits  ETHANOL  PROTIME-INR  APTT  DIFFERENTIAL  RAPID URINE DRUG SCREEN, HOSP PERFORMED  URINALYSIS, ROUTINE W REFLEX MICROSCOPIC  HCG, SERUM, QUALITATIVE    EKG EKG Interpretation  Date/Time:  Monday September 11 2020 19:11:48 EDT Ventricular Rate:  83 PR Interval:    QRS Duration: 76 QT Interval:  492 QTC Calculation: 579 R Axis:     Text Interpretation: Sinus rhythm Nonspecific T  abnrm, anterolateral leads Prolonged QT interval No significant change since last tracing Confirmed by Deno Etienne [id number]) on 09/11/2020 7:16:51 PM   Radiology CT HEAD WO CONTRAST  Result Date: 09/11/2020 CLINICAL DATA:  55 year old female with neurologic deficit. EXAM: CT HEAD WITHOUT CONTRAST TECHNIQUE: Contiguous axial images were obtained from the base of the skull through the vertex without intravenous contrast. COMPARISON:  Head CT dated 09/26/2017. FINDINGS: Brain: The ventricles and sulci appropriate size for patient's age. The gray-white matter discrimination is preserved. There is no acute intracranial hemorrhage. No mass effect or midline shift. No extra-axial fluid collection. Vascular: No hyperdense vessel or unexpected calcification. Skull: Normal. Negative for fracture or focal lesion. Sinuses/Orbits: No acute finding. Other: None IMPRESSION: Unremarkable noncontrast CT of the brain. Electronically Signed   By: Anner Crete M.D.   On: 09/11/2020 19:57   MR BRAIN WO CONTRAST  Result Date: 09/11/2020  CLINICAL DATA:  Slurred speech and left facial twitching EXAM: MRI HEAD WITHOUT CONTRAST TECHNIQUE: Multiplanar, multiecho pulse sequences of the brain and surrounding structures were obtained without intravenous contrast. COMPARISON:  09/26/2017 FINDINGS: Brain: No acute infarct, mass effect or extra-axial collection. No acute or chronic hemorrhage. There is multifocal hyperintense T2-weighted signal within the white matter. There is a new focus of hyperintensity at the mid right cerebellum compared to 09/26/2017. Parenchymal volume and CSF spaces are normal. The midline structures are normal. Vascular: Major flow voids are preserved. Skull and upper cervical spine: Normal calvarium and skull base. Visualized upper cervical spine and soft tissues are normal. Sinuses/Orbits:No paranasal sinus fluid levels or advanced mucosal thickening. No mastoid or middle ear effusion. Normal orbits.  IMPRESSION: 1. No acute intracranial abnormality. 2. 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 Electronically Signed   By: Ulyses Jarred M.D.   On: 09/11/2020 21:16    Procedures Procedures   Medications Ordered in ED Medications - No data to display  ED Course  I have reviewed the triage vital signs and the nursing notes.  Pertinent labs & imaging results that were available during my care of the patient were reviewed by me and considered in my medical decision making (see chart for details).    MDM Rules/Calculators/A&P                          55 yo F with a chief complaints of slurred speech off and on for the past couple days.  Experienced an event today where she felt like her mouth drip for about a second and then resolved.  Not back to baseline.  Benign neuro exam for me.  Will obtain a CT of the head blood work reassess.  CT the head is unremarkable.  No significant lab finding.  No significant electrolyte abnormality.  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  I discussed case with Dr. Rory Percy, neurology felt she was okay for outpatient follow-up.  9:32 PM:  I have discussed the diagnosis/risks/treatment options with the patient and believe the pt to be eligible for discharge home to follow-up with PCP. We also discussed returning to the ED immediately if new or worsening sx occur. We discussed the sx which are most concerning (e.g., sudden worsening pain, fever, inability to tolerate by mouth) that necessitate immediate return. Medications administered to the patient during their visit and any new prescriptions provided to the patient are listed below.  Medications given during this visit Medications - No data to display   The patient appears reasonably screen and/or stabilized for discharge and I doubt any other medical condition or other [facility]  requiring further screening, evaluation, or treatment in the ED at this time prior to discharge.  Final Clinical Impression(s) / ED Diagnoses Final diagnoses:  Slurred speech  Facial twitching    Rx / DC Orders ED Discharge Orders    None       Deno Etienne, DO 09/11/20 [id number]

## 2020-09-11 NOTE — Discharge Instructions (Signed)
Follow up with your neurologist in the office.  Return for worsening symptoms.

## 2020-09-12 ENCOUNTER — Telehealth: Payer: Self-pay | Admitting: Neurology

## 2020-09-12 NOTE — Telephone Encounter (Signed)
Patient called back in wanting Dr. Tomi Likens to be aware she went to the emergency room last night and got several tests done.

## 2020-09-12 NOTE — Telephone Encounter (Signed)
Please see FYI. 

## 2020-09-17 NOTE — Telephone Encounter (Signed)
We can use a work-in slot or put her on near top of waitlist to discuss

## 2020-09-18 NOTE — Telephone Encounter (Signed)
Please see note, thank you

## 2020-09-18 NOTE — Telephone Encounter (Signed)
lmom for patient to call back to get worked in and I put her on the wait list

## 2020-09-19 NOTE — Progress Notes (Signed)
NEUROLOGY FOLLOW UP OFFICE NOTE  Kendra Hall [id number]  Assessment/Plan:   1.  Episode of facial twitching - brief - unclear etiology.  She has had at least two prior stroke-like episodes of (slurred speech with left sided weakness in 2016 and left sided facial numbness in 2019) with no correlating acute stroke.  Questionable if TIA vs migraine.  I do not believe that this most recent event was cerebrovascular. 2.  Increased headache and fatigue - may be related to OSA.  Just restarted using CPAP - monitor for improvement 3.  Incidental remote cerebellar infarct.  MRA of head in 2019 showed no vertebrobasilar insufficiency.  Given findings, recommend restarting ASA 75m daily. 4.  BPPV - resolved 5.  Follow up in September as scheduled.  Subjective:  Kendra Hall a 89ear old woman with type 2 diabetes mellitus, hypothyroidism, fibromyalgia and depression who follows up for stroke-like event.  UPDATE: On 09/11/2020, she went to the ED for twitching on the left side of her mouth lasting a second.  For the previous two days, she was experiencing intermittent slurred speech.  No unilateral numbness, weakness or headache.  CT head unremarkable.  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  She stayed out of work for the rest of last week.  On Monday and Tuesday she reports having a brief episode of speech disturbance, trouble saying the correct word.  She has had some headache.  She reports increased daytime fatigue.  She has OSA but had not been able to use her CPAP for some time.  Just started using it again over the weekend.  Intensity:  Moderate to severe Duration:  1 to 2 hours Frequency:  1 to 2 days a month Current NSAIDS:none Current analgesics:Hydrocodone most days for fibromyalgia but has been treating headaches with them as  well Current triptans:Maxalt MLT 10 mg Current ergotamine:None Current anti-emetic:Promethazine12.56mCurrent muscle relaxants:baclofen Current anti-anxiolytic:BuSpar Current sleep aide:None Current Antihypertensive medications:Would not use beta-blocker due to asthma Current Antidepressant medications:Cymbalta6072mID Current Anticonvulsant medications:zonisamide 28m2m bedtime, Lyrica 100mg51m Current anti-CGRP:Emgality  Current Vitamins/Herbal/Supplements:None Current Antihistamines/Decongestants:None Other therapy:None  Caffeine:No Diet:Drinks Exercise:Notroutine Depression:Yes; Anxiety:Yes Other pain:Neck pain, fibromyalgia Sleep hygiene:She underwent sleep study and was diagnosed with severe OSA.  She is using a CPAP.  Sleep is improved, as well as headaches.   HISTORY: Onset: She has had history of migrainessince her 20s,but theybecame frequent in 2018. Location: Varies: back of head, across forehead, temples Quality: Varies: squeezing, sharp Initial Intensity: 10/10 Aura: no Prodrome: no Postdrome: no Associated symptoms:Nausea, photophobia, phonophobia. She denies associated vomiting, visual disturbance, or unilateral numbness or weakness. It is not a new thunderclap headache. Initial Duration: All day (usually lets up in 30 minutes after taking Maxalt, however lately headache returns in 2 hours) Initial Frequency: Every other day Initial Frequency of abortive medication: every other day Triggers: None Relieving factors: Sleep Activity: aggravates  In July 2021, shebeganwaking up every morning with a headache, a severe squeezing on the top of her head. It tends to happen if she wakes up from a vivid dream. If she takes Maxalt, it aborts in 1 to 2 hours, otherwise it would last 1/2 a day. She has had a change in her sleep schedule beginning a couple of weeks ago in which she started sleeping during the day and has been  up at night.  She reports that she has had a formal eye exam this past year which was unremarkable.  Past NSAIDS/steroids: ibuprofen, prednisone (unable to take0 Past analgesics: Tylenol, Excedrin Migraine Past abortive triptans: Sumatriptan tablet Past muscle relaxants: no Past anti-emetic: Zofran Past antihypertensive medications: no Past antidepressant medications: sertraline 28m,nortriptyline 727mas needed for sleep (cannot take nightly due to extreme dry mouth), Lexapro 2021mast anticonvulsant medications: topiramate immediate release 100m68mognitive problems),Topiramate ER 100 mg (effective) Past vitamins/Herbal/Supplements: no Other past therapies: no  Family history of headache: cousins  MRI and MRA of head from 03/15/15 were personally reviewed and were unremarkable except for minimal punctate white matter foci.  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 CT cervical spine from 01/09/18 showed cervical spondylosis with degenerative changesgreatest at C4-C7 levels with bilateral neural foraminal stenosis, as well as mild multifactorial C5-6 canal stenosis. She is treated by pain management.  PAST MEDICAL HISTORY: Past Medical History:  Diagnosis Date  . Allergy   . Anemia    history of  . Anxiety   . Asthma   . Depression   . Diabetes mellitus    type 2   . Fibromyalgia   . Gastric ulcer   . GERD (gastroesophageal reflux disease)   . Grave's disease   . Graves disease   . H/O therapeutic radiation   . Migraine   . Multiple thyroid nodules   . Myofacial muscle pain   . Osteoarthritis    Facet hypertrophy with injections  . TIA (transient ischemic attack)    02/2015    MEDICATIONS: Current Outpatient Medications on File Prior to Visit  Medication Sig Dispense Refill  . albuterol (PROAIR HFA) 108 (90 Base) MCG/ACT inhaler Inhale 2 puffs into the lungs every 4 (four) hours as needed for wheezing. 1  Inhaler 2  . albuterol (PROVENTIL) (2.5 MG/3ML) 0.083% nebulizer solution Take 3 mLs by nebulization 4 (four) times daily as needed for wheezing or shortness of breath.   3  . amLODipine (NORVASC) 2.5 MG tablet Take 2.5 mg by mouth daily.    . baclofen (LIORESAL) 10 MG tablet TAKE 1 TABLET BY MOUTH TWICE DAILY. CAUTION FOR DROWSINESS (Patient taking differently: Take 10 mg by mouth daily as needed for muscle spasms.) 60 tablet 0  . BD PEN NEEDLE NANO 2ND GEN 32G X 4 MM MISC USE TO INJECT INSULIN THREE TIMES DAILY 100 each 0  . benzonatate (TESSALON) 100 MG capsule Take 100 mg by mouth 3 (three) times daily as needed for cough.    . Blood Glucose Calibration (ONETOUCH VERIO) SOLN Used to check meter accuracy (Patient taking differently: 1 each by Other route as needed. Use to calibrate meter prn) 1 each 0  . Blood Glucose Monitoring Suppl (ONETOUCH VERIO) w/Device KIT 1 each by Other route 2 (two) times daily.     . DULoxetine (CYMBALTA) 60 MG capsule Take 60 mg by mouth 2 (two) times daily.    . Galcanezumab-gnlm (EMGALITY) 120 MG/ML SOAJ Inject 120 mg into the skin every 30 (thirty) days. 1 pen 11  . glucose blood (ONETOUCH VERIO) test strip Check your blood sugar once a day.  vary the time of day when you check, between before the 3 meals, and at bedtime 200 strip 0  . HYDROMET 5-1.5 MG/5ML syrup Take 5 mLs by mouth 4 (four) times daily as needed for cough.    . hydrOXYzine (VISTARIL) 25 MG capsule Take 25 mg by mouth 3 (three) times  daily as needed for itching.    . insulin glargine, 2 Unit Dial, (TOUJEO MAX SOLOSTAR) 300 UNIT/ML Solostar Pen Inject 80 Units into the skin every morning. 84 mL 3  . lamoTRIgine (LAMICTAL) 25 MG tablet Take 50 mg by mouth daily.    . Lancets (ONETOUCH DELICA PLUS [id number]) MISC as directed 500 each 0  . levothyroxine (SYNTHROID) 150 MCG tablet TAKE 1 TABLET(150 MCG) BY MOUTH DAILY BEFORE BREAKFAST (Patient taking differently: Take 150 mcg by mouth daily before  breakfast.) 90 tablet 3  . lidocaine (LIDODERM) 5 % UNWRAP AND APPLY 1 PATCH TO SKIN DAILY FOR 15 DAYS. REMOVE AND DISCARD PATCH WITHIN 12 HOURS OR AS DIRECTED (Patient taking differently: Place 1 patch onto the skin daily as needed (pain).) 15 patch 0  . linaclotide (LINZESS) 290 MCG CAPS capsule Take 290 mcg by mouth daily before breakfast.     . Magnesium 250 MG TABS Take 750 mg by mouth at bedtime.    . montelukast (SINGULAIR) 10 MG tablet Take 10 mg by mouth daily.   3  . omeprazole (PRILOSEC) 40 MG capsule Take 40 mg by mouth 2 (two) times daily.     . pravastatin (PRAVACHOL) 20 MG tablet Take 20 mg by mouth daily.    . pregabalin (LYRICA) 50 MG capsule Take 100 mg by mouth daily.    . promethazine (PHENERGAN) 12.5 MG tablet Take 12.5 mg by mouth every 6 (six) hours as needed for nausea or vomiting.    Marland Kitchen QUEtiapine (SEROQUEL) 25 MG tablet Take 37.5 mg by mouth at bedtime.    . rizatriptan (MAXALT-MLT) 10 MG disintegrating tablet DISSOLVE 1 TABLET ON THE TONGUE AT ONSET OF HEADACHE. MAY REPEAT 1 TIME IN 2 HOURS AS NEEDED (Patient taking differently: Take 10 mg by mouth daily as needed for migraine.) 9 tablet 3  . triamcinolone (KENALOG) 0.1 % Apply 1 application topically 2 (two) times daily as needed (itching).    . valACYclovir (VALTREX) 500 MG tablet Take 500 mg by mouth daily.    Marland Kitchen zonisamide (ZONEGRAN) 50 MG capsule Take 1 capsule (50 mg total) by mouth daily. (Patient not taking: No sig reported) 30 capsule 5   No current facility-administered medications on file prior to visit.    ALLERGIES: Allergies  Allergen Reactions  . Eggs Or Egg-Derived Products Other (See Comments)    Don't eat eggs  . Ergotrate [Ergonovine] Other (See Comments)    Pt does not ever want  . Ibuprofen Other (See Comments)    Patient has ulcer  . Latex Other (See Comments)    Breaks out  . Metformin And Related Other (See Comments)    Dizziness  . Yutopar [Ritodrine] Other (See Comments)    Pt does  not ever want    FAMILY HISTORY: Family History  Problem Relation Age of Onset  . Hypertension Mother   . Diabetes Mother   . Cancer Mother        rectal  . Heart disease Father 27       MI x5  . Hypertension Father   . Diabetes Father   . Drug abuse Brother   . Alcohol abuse Brother   . Breast cancer Sister        unsure of age , was between 60 and 53  . Drug abuse Brother   . Alcohol abuse Brother   . Anxiety disorder Neg Hx   . Bipolar disorder Neg Hx   . Depression Neg Hx  Objective:  Blood pressure (!) 150/85, pulse (!) 108, height '5\' 7"'  (1.702 m), weight 207 lb (93.9 kg), last menstrual period 12/06/2016, SpO2 99 %. General: No acute distress.  Patient appears well-groomed.   Head:  Normocephalic/atraumatic Eyes:  Fundi examined but not visualized Neck: supple, no paraspinal tenderness, full range of motion Heart:  Regular rate and rhythm Lungs:  Clear to auscultation bilaterally Back: No paraspinal tenderness Neurological Exam: alert and oriented to person, place, and time. Attention span and concentration intact, recent and remote memory intact, fund of knowledge intact.  Speech fluent and not dysarthric, language intact.  CN II-XII intact. Bulk and tone normal, muscle strength 5/5 throughout.  Sensation to light touch, temperature and vibration intact.  Deep tendon reflexes 2+ throughout, toes downgoing.  Finger to nose and heel to shin testing intact.  Gait normal, Romberg negative.     Kendra Clines, DO  CC: Simona Huh, NP

## 2020-09-20 ENCOUNTER — Encounter: Payer: Self-pay | Admitting: Neurology

## 2020-09-20 ENCOUNTER — Other Ambulatory Visit: Payer: Self-pay

## 2020-09-20 ENCOUNTER — Ambulatory Visit (INDEPENDENT_AMBULATORY_CARE_PROVIDER_SITE_OTHER): Payer: 59 | Admitting: Neurology

## 2020-09-20 VITALS — BP 150/85 | HR 108 | Ht 67.0 in | Wt 207.0 lb

## 2020-09-20 DIAGNOSIS — H8112 Benign paroxysmal vertigo, left ear: Secondary | ICD-10-CM

## 2020-09-20 DIAGNOSIS — G4733 Obstructive sleep apnea (adult) (pediatric): Secondary | ICD-10-CM

## 2020-09-20 DIAGNOSIS — G43009 Migraine without aura, not intractable, without status migrainosus: Secondary | ICD-10-CM

## 2020-09-20 DIAGNOSIS — G514 Facial myokymia: Secondary | ICD-10-CM | POA: Diagnosis not present

## 2020-09-20 NOTE — Patient Instructions (Signed)
I am not sure exactly what happened last week.  While mri did not show any new stroke there was evidence of a small old stroke.  Not sure when it could have occurred but would recommend restarting aspirin 81mg  daily.  Continue CPAP and we will see if headaches and fatigue improves  Follow up in September as scheduled.

## 2020-09-22 ENCOUNTER — Other Ambulatory Visit: Payer: Self-pay

## 2020-09-22 ENCOUNTER — Ambulatory Visit (INDEPENDENT_AMBULATORY_CARE_PROVIDER_SITE_OTHER): Payer: 59 | Admitting: Endocrinology

## 2020-09-22 VITALS — BP 136/80 | HR 81 | Ht 64.0 in | Wt 208.8 lb

## 2020-09-22 DIAGNOSIS — E119 Type 2 diabetes mellitus without complications: Secondary | ICD-10-CM

## 2020-09-22 DIAGNOSIS — Z794 Long term (current) use of insulin: Secondary | ICD-10-CM | POA: Diagnosis not present

## 2020-09-22 LAB — T4, FREE: Free T4: 0.75 ng/dL (ref 0.60–1.60)

## 2020-09-22 LAB — POCT GLYCOSYLATED HEMOGLOBIN (HGB A1C): Hemoglobin A1C: 12.7 % — AB (ref 4.0–5.6)

## 2020-09-22 LAB — TSH: TSH: 62.72 u[IU]/mL — ABNORMAL HIGH (ref 0.35–4.50)

## 2020-09-22 MED ORDER — LEVOTHYROXINE SODIUM 150 MCG PO TABS: 150.0000 ug | ORAL_TABLET | Freq: Every day | ORAL | 3 refills | Status: DC

## 2020-09-22 MED ORDER — TOUJEO MAX SOLOSTAR 300 UNIT/ML ~~LOC~~ SOPN: 80.0000 [IU] | PEN_INJECTOR | SUBCUTANEOUS | 3 refills | Status: DC

## 2020-09-22 NOTE — Patient Instructions (Addendum)
Your blood pressure is high today.  Please see your primary care provider soon, to have it rechecked Please take 80 units each morning. I have sent a prescription to your pharmacy. check your blood sugar once a day.  vary the time of day when you check, between before the 3 meals, and at bedtime.  also check if you have symptoms of your blood sugar being too high or too low.  please keep a record of the readings and bring it to your next appointment here (or you can bring the meter itself).  You can write it on any piece of paper.  please call us sooner if your blood sugar goes below 70, or if you have a lot of readings over 200. For your weight, try to avoid snacking at night, and soft drinks.     Please come back for a follow-up appointment in 1 month.

## 2020-09-22 NOTE — Progress Notes (Signed)
Subjective:    Patient ID: Kendra Hall, female    DOB: [birth year], 55 y.o.   MRN: [id number]  HPI Pt returns for f/u of diabetes mellitus:  DM type: Insulin-requiring type 2 Dx'ed: 6160 Complications: renal insuff.    Therapy: insulin since 2020 GDM: 1984.   DKA: never.   Severe hypoglycemia: once, in 2018.  Pancreatitis: never.  Other: metformin caused dizziness; she also took insulin 2012-2018.  she did not tolerate invokana (vaginitis); renal insuff limits rx options; She stopped trulicity, due to nausea. Interval history: No recent steroids.  no cbg record, but states cbg's vary from 200-400.  She takes 60 units/d, but she often misses.   Obesity: she took Victoza/Trulicity 7371-0626.  Pt wants gastric bypass, but she needs to have monthly ov's here.   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  She takes synthroid as rx'ed.   Past Medical History:  Diagnosis Date  . Allergy   . Anemia    history of  . Anxiety   . Asthma   . Depression   . Diabetes mellitus    type 2   . Fibromyalgia   . Gastric ulcer   . GERD (gastroesophageal reflux disease)   . Grave's disease   . Graves disease   . H/O therapeutic radiation   . Migraine   . Multiple thyroid nodules   . Myofacial muscle pain   . Osteoarthritis    Facet hypertrophy with injections  . TIA (transient ischemic attack)    02/2015    Past Surgical History:  Procedure Laterality Date  . AXILLARY LYMPH NODE DISSECTION  2001  . CHOLECYSTECTOMY N/A 09/03/2018   Procedure: LAPAROSCOPIC CHOLECYSTECTOMY;  Surgeon: Coralie Keens, MD;  Location: [facility];  Service: General;  Laterality: N/A;  . HYDRADENITIS EXCISION    . TUBAL LIGATION  1995  . VAGINAL HYSTERECTOMY Left 03/11/2017   Procedure: HYSTERECTOMY VAGINAL W/LEFT PROXIMAL SALPINGECTOMY;  Surgeon: Princess Bruins, MD;  Location: [facility];  Service: Gynecology;  Laterality: Left;  request 7:30am OR  time  request 2 hours      Social History   Socioeconomic History  . Marital status: Single    Spouse name: Not on file  . Number of children: 4  . Years of education: 63  . Highest education level: Not on file  Occupational History  . Occupation: Research scientist (physical sciences): [employer]  Tobacco Use  . Smoking status: Former Smoker    Packs/day: 0.50    Years: 30.00    Pack years: 15.00    Types: Cigarettes    Quit date: 04/24/2018    Years since quitting: 2.4  . Smokeless tobacco: Never Used  Vaping Use  . Vaping Use: Never used  Substance and Sexual Activity  . Alcohol use: No    Comment: rare  . 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  Today denies all drug abuse  . Sexual activity: Not Currently    Birth control/protection: Surgical  Other Topics Concern  . Not on file  Social History Narrative   Lives alone in [place]. Her daugther occassoinally comes and stay with her. 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  originally from [place],  [place].  Raised by mom and her sister  who is 8 yrs older than pt. Pt has several half siblings. No longer on disability. Pt has 4 kids, never married.          Social Determinants of Health   Financial Resource Strain: Not on file  Food Insecurity: Not on file  Transportation Needs: Not on file  Physical Activity: Not on file  Stress: Not on file  Social Connections: Not on file  Intimate Partner Violence: Not on file    Current Outpatient Medications on File Prior to Visit  Medication Sig Dispense Refill  . albuterol (PROAIR HFA) 108 (90 Base) MCG/ACT inhaler Inhale 2 puffs into the lungs every 4 (four) hours as needed for wheezing. 1 Inhaler 2  . albuterol (PROVENTIL) (2.5 MG/3ML) 0.083% nebulizer solution Take 3 mLs by nebulization 4 (four) times daily as needed  for wheezing or shortness of breath.   3  . amLODipine (NORVASC) 2.5 MG tablet Take 2.5 mg by mouth daily.    . baclofen (LIORESAL) 10 MG tablet TAKE 1 TABLET BY MOUTH TWICE DAILY. CAUTION FOR DROWSINESS (Patient taking differently: Take 10 mg by mouth daily as needed for muscle spasms.) 60 tablet 0  . BD PEN NEEDLE NANO 2ND GEN 32G X 4 MM MISC USE TO INJECT INSULIN THREE TIMES DAILY 100 each 0  . benzonatate (TESSALON) 100 MG capsule Take 100 mg by mouth 3 (three) times daily as needed for cough.    . Blood Glucose Calibration (ONETOUCH VERIO) SOLN Used to check meter accuracy (Patient taking differently: 1 each by Other route as needed. Use to calibrate meter prn) 1 each 0  . Blood Glucose Monitoring Suppl (ONETOUCH VERIO) w/Device KIT 1 each by Other route 2 (two) times daily.     . DULoxetine (CYMBALTA) 60 MG capsule Take 60 mg by mouth 2 (two) times daily.    . Galcanezumab-gnlm (EMGALITY) 120 MG/ML SOAJ Inject 120 mg into the skin every 30 (thirty) days. 1 pen 11  . glucose blood (ONETOUCH VERIO) test strip Check your blood sugar once a day.  vary the time of day when you check, between before the 3 meals, and at bedtime 200 strip 0  . hydrOXYzine (VISTARIL) 25 MG capsule Take 25 mg by mouth 3 (three) times daily as needed for itching.    . lamoTRIgine (LAMICTAL) 100 MG tablet Take 100 mg by mouth daily.    Marland Kitchen lamoTRIgine (LAMICTAL) 25 MG tablet Take 50 mg by mouth daily.    . Lancets (ONETOUCH DELICA PLUS [id number]) MISC as directed 500 each 0  . lidocaine (LIDODERM) 5 % UNWRAP AND APPLY 1 PATCH TO SKIN DAILY FOR 15 DAYS. REMOVE AND DISCARD PATCH WITHIN 12 HOURS OR AS DIRECTED (Patient taking differently: Place 1 patch onto the skin daily as needed (pain).) 15 patch 0  . linaclotide (LINZESS) 290 MCG CAPS capsule Take 290 mcg by mouth daily before breakfast.     . Magnesium 250 MG TABS Take 750 mg by mouth at bedtime.    . montelukast (SINGULAIR) 10 MG tablet Take 10 mg by mouth daily.   3  .  omeprazole (PRILOSEC) 40 MG capsule Take 40 mg by mouth 2 (two) times daily.     . pravastatin (PRAVACHOL) 20 MG tablet Take 20 mg by mouth daily.    . pregabalin (LYRICA) 50 MG capsule Take 100 mg by mouth daily.    . promethazine (PHENERGAN) 12.5 MG tablet Take 12.5 mg by mouth every 6 (six) hours as needed for nausea  or vomiting.    Marland Kitchen QUEtiapine (SEROQUEL) 25 MG tablet Take 37.5 mg by mouth at bedtime.    . rizatriptan (MAXALT-MLT) 10 MG disintegrating tablet DISSOLVE 1 TABLET ON THE TONGUE AT ONSET OF HEADACHE. MAY REPEAT 1 TIME IN 2 HOURS AS NEEDED (Patient taking differently: Take 10 mg by mouth daily as needed for migraine.) 9 tablet 3  . triamcinolone (KENALOG) 0.1 % Apply 1 application topically 2 (two) times daily as needed (itching).    . valACYclovir (VALTREX) 500 MG tablet Take 500 mg by mouth daily.    Marland Kitchen zonisamide (ZONEGRAN) 50 MG capsule Take 1 capsule (50 mg total) by mouth daily. 30 capsule 5   No current facility-administered medications on file prior to visit.    Allergies  Allergen Reactions  . Eggs Or Egg-Derived Products Other (See Comments)    Don't eat eggs  . Ergotrate [Ergonovine] Other (See Comments)    Pt does not ever want  . Ibuprofen Other (See Comments)    Patient has ulcer  . Latex Other (See Comments)    Breaks out  . Metformin And Related Other (See Comments)    Dizziness  . Yutopar [Ritodrine] Other (See Comments)    Pt does not ever want    Family History  Problem Relation Age of Onset  . Hypertension Mother   . Diabetes Mother   . Cancer Mother        rectal  . Heart disease Father 61       MI x5  . Hypertension Father   . Diabetes Father   . Drug abuse Brother   . Alcohol abuse Brother   . Breast cancer Sister        unsure of age , was between 35 and 65  . Drug abuse Brother   . Alcohol abuse Brother   . Anxiety disorder Neg Hx   . Bipolar disorder Neg Hx   . Depression Neg Hx     BP 136/80 (BP Location: Right Arm, Patient  Position: Sitting, Cuff Size: Large)   Pulse 81   Ht '5\' 4"'  (1.626 m)   Wt 208 lb 12.8 oz (94.7 kg)   LMP 12/06/2016 (Approximate)   SpO2 98%   BMI 35.84 kg/m    Review of Systems     Objective:   Physical Exam    Lab Results  Component Value Date   HGBA1C 12.7 (A) 09/22/2020   Lab Results  Component Value Date   TSH 2.29 10/27/2019   T3TOTAL 113.37 03/27/2019   Lab Results  Component Value Date   TSH 62.72 Repeated and verified X2. (H) 09/22/2020   T3TOTAL 113.37 03/27/2019       Assessment & Plan:  Hypothyroidism: uncontrolled Insulin-requiring type 2 DM: uncontrolled Noncompliance with meds.  I have sent a prescription to your pharmacy, to refill synthroid. Obesity, persistent   Patient Instructions  Your blood pressure is high today.  Please see your primary care provider soon, to have it rechecked Please take 80 units each morning. I have sent a prescription to your pharmacy. check your blood sugar once a day.  vary the time of day when you check, between before the 3 meals, and at bedtime.  also check if you have symptoms of your blood sugar being too high or too low.  please keep a record of the readings and bring it to your next appointment here (or you can bring the meter itself).  You can write it on any piece  of paper.  please call us sooner if your blood sugar goes below 70, or if you have a lot of readings over 200. For your weight, try to avoid snacking at night, and soft drinks.     Please come back for a follow-up appointment in 1 month.

## 2020-10-03 ENCOUNTER — Other Ambulatory Visit: Payer: Self-pay | Admitting: Neurology

## 2020-10-04 ENCOUNTER — Telehealth: Payer: Self-pay | Admitting: Endocrinology

## 2020-10-04 NOTE — Telephone Encounter (Signed)
Pt calling stating that her thyroid levels were low last visit. States that she has been taking  levothyroxine (SYNTHROID) 150 MCG tablet which is same amount that has been taking and has not changed. Patient would like new prescription.

## 2020-10-05 ENCOUNTER — Telehealth: Payer: Self-pay | Admitting: Endocrinology

## 2020-10-05 MED ORDER — LEVOTHYROXINE SODIUM 175 MCG PO TABS: 175.0000 ug | ORAL_TABLET | Freq: Every day | ORAL | 3 refills | Status: DC

## 2020-10-05 NOTE — Telephone Encounter (Signed)
Ok, I have sent a prescription to your pharmacy, to increase

## 2020-10-05 NOTE — Telephone Encounter (Signed)
Per patient - Dr Loanne Drilling wanted her to take 80 units in the morning every day of toujeo, and she said the past few mornings her levels have been 79, 85, and 92 before she takes it and she wanted to know if she could take it in the afternoon instead? Ph# [phone]

## 2020-10-06 NOTE — Telephone Encounter (Signed)
Called and advised pt to reduce her insulin to 70 units qam. Pt advised she is not a big breakfast eater and her first meal of the day is not until after 11 and she just wanted to know if she could hold off on taking her insulin first thing in the morning and take it closer to her first meal of the day.

## 2020-10-06 NOTE — Telephone Encounter (Signed)
Please reduce to 70 units qam

## 2020-10-07 NOTE — Telephone Encounter (Signed)
It is best to not wait.  This is because low sugar in the morning is due to yesterday's insulin hanging around.

## 2020-10-08 NOTE — Telephone Encounter (Signed)
Message sent thru MyChart 

## 2020-10-12 ENCOUNTER — Telehealth: Payer: Self-pay | Admitting: Neurology

## 2020-10-12 NOTE — Telephone Encounter (Signed)
Patient called in wanting Dr. Tomi Likens to be aware of a situation that happened last night. She stated she was on the phone and the other person started to not understand what she was saying at the same time, she was getting confused with what she was saying also. She stated she called that person back today to double check. He told her he could understand what she was saying, but she was not speaking coherent sentences. He also told her she was "going in and out". This worries her since she had a TIA in the past. [place] and is planning on being back home on Monday.

## 2020-10-12 NOTE — Telephone Encounter (Signed)
Called patient and left a message for a call  Back. Can offer appt with sarah next week when she returns back home per Dr. Tomi Likens

## 2020-10-12 NOTE — Telephone Encounter (Signed)
Patient returned call to [facility].

## 2020-10-13 NOTE — Telephone Encounter (Signed)
Patient is scheduled with Clarise Cruz.

## 2020-10-17 NOTE — Progress Notes (Incomplete)
NEUROLOGY FOLLOW UP OFFICE NOTE  BONNEE ZERTUCHE [id number]  Assessment/Plan:   1.  Episode of facial twitching - brief - unclear etiology.  She has had at least two prior stroke-like episodes of (slurred speech with left sided weakness in 2016 and left sided facial numbness in 2019) with no correlating acute stroke.  Questionable if TIA vs migraine. It is not believed that  that this most recent event was cerebrovascular. 2.  Increased headache and fatigue - may be related to OSA.  Just restarted using CPAP - monitor for improvement 3.  Incidental remote cerebellar infarct.  MRA of head in 2019 showed no vertebrobasilar insufficiency.  Given findings, recommend restarting ASA 34m daily. 4.  BPPV - resolved 5.   Keep Follow up in September as scheduled.  Subjective:  TLenoir Facchiniis a 89ear old woman with type 2 diabetes mellitus, hypothyroidism, fibromyalgia, OSA on CPAP, history of headache, anxiety  and depression who follows up for stroke-like event.  Patient is seen prior to her scheduled appointment, after an episode of confusion and incoherent speech happening on 10/11/2020.  Per patient report, she was on the phone with another person, who witnessed the patient "not speaking coherent sentences, going in and out ".  Because of her prior history of TIA, patient wanted to be evaluated at our office.  Prior to this visit, she has been seen here  following an ED visit on 09/11/2020 for left facial and mouth twitching, and slurred speech.  There was no unilateral numbness, weakness or headache.  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 Of note her Hb A1C was over 12.  Today the patient***  Headaches:  Intensity:  Moderate to severe Duration:  1 to 2 hours Frequency:  1 to 2 days a month Current NSAIDS:none Current  analgesics:Hydrocodone most days for fibromyalgia but has been treating headaches with them as well Current triptans:Maxalt MLT 10 mg Current ergotamine:None Current anti-emetic:Promethazine12.57mCurrent muscle relaxants:baclofen Current anti-anxiolytic:BuSpar Current sleep aide:None Current Antihypertensive medications:Would not use beta-blocker due to asthma Current Antidepressant medications:Cymbalta6058mID Current Anticonvulsant medications:zonisamide 46m47m bedtime, Lyrica 100mg46m Current anti-CGRP:Emgality  Current Vitamins/Herbal/Supplements:None Current Antihistamines/Decongestants:None Other therapy:None  Caffeine:No Diet:Drinks Exercise:Notroutine Depression:Yes; Anxiety:Yes Other pain:Neck pain, fibromyalgia Sleep hygiene:She underwent sleep study and was diagnosed with severe OSA.  She is using a CPAP.  Sleep is improved, as well as headaches.   HISTORY: Onset: She has had history of migrainessince her 20s,but theybecame frequent in 2018. Location: Varies: back of head, across forehead, temples Quality: Varies: squeezing, sharp Initial Intensity: 10/10 Aura: no Prodrome: no Postdrome: no Associated symptoms:Nausea, photophobia, phonophobia. She denies associated vomiting, visual disturbance, or unilateral numbness or weakness. It is not a new thunderclap headache. Initial Duration: All day (usually lets up in 30 minutes after taking Maxalt, however lately headache returns in 2 hours) Initial Frequency: Every other day Initial Frequency of abortive medication: every other day Triggers: None Relieving factors: Sleep Activity: aggravates  In July 2021, shebeganwaking up every morning with a headache, a severe squeezing on the top of her head. It tends to happen if she wakes up from a vivid dream. If she takes Maxalt, it aborts in 1 to 2 hours, otherwise it would last 1/2 a day. She has had a change in her sleep  schedule beginning a couple of  weeks ago in which she started sleeping during the day and has been up at night. She reports that she has had a formal eye exam this past year which was unremarkable.  Past NSAIDS/steroids: ibuprofen, prednisone (unable to take) Past analgesics: Tylenol, Excedrin Migraine Past abortive triptans: Sumatriptan tablet Past muscle relaxants: no Past anti-emetic: Zofran Past antihypertensive medications: no Past antidepressant medications: sertraline 76m,nortriptyline 789mas needed for sleep (cannot take nightly due to extreme dry mouth), Lexapro 203mast anticonvulsant medications: topiramate immediate release 100m83mognitive problems),Topiramate ER 100 mg (effective) Past vitamins/Herbal/Supplements: no Other past therapies: no  Family history of headache: cousins   Imaging : MRI and MRA of head from 03/15/15 were personally reviewed and were unremarkable except for minimal punctate white matter foci.    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 CT cervical spine from 01/09/18 showed cervical spondylosis with degenerative changesgreatest at C4-C7 levels with bilateral neural foraminal stenosis, as well as mild multifactorial C5-6 canal stenosis. She is treated by pain management.  PAST MEDICAL HISTORY: Past Medical History:  Diagnosis Date  . Allergy   . Anemia    history of  . Anxiety   . Asthma   . Depression   . Diabetes mellitus    type 2   . Fibromyalgia   . Gastric ulcer   . GERD (gastroesophageal reflux disease)   . Grave's disease   . Graves disease   . H/O therapeutic radiation   . Migraine   . Multiple thyroid nodules   . Myofacial muscle pain   . Osteoarthritis    Facet hypertrophy with injections  . TIA (transient ischemic attack)    02/2015    MEDICATIONS: Current Outpatient Medications on File Prior to Visit  Medication Sig Dispense Refill  . albuterol (PROAIR  HFA) 108 (90 Base) MCG/ACT inhaler Inhale 2 puffs into the lungs every 4 (four) hours as needed for wheezing. 1 Inhaler 2  . albuterol (PROVENTIL) (2.5 MG/3ML) 0.083% nebulizer solution Take 3 mLs by nebulization 4 (four) times daily as needed for wheezing or shortness of breath.   3  . amLODipine (NORVASC) 2.5 MG tablet Take 2.5 mg by mouth daily.    . baclofen (LIORESAL) 10 MG tablet TAKE 1 TABLET BY MOUTH TWICE DAILY. CAUTION FOR DROWSINESS (Patient taking differently: Take 10 mg by mouth daily as needed for muscle spasms.) 60 tablet 0  . BD PEN NEEDLE NANO 2ND GEN 32G X 4 MM MISC USE TO INJECT INSULIN THREE TIMES DAILY 100 each 0  . benzonatate (TESSALON) 100 MG capsule Take 100 mg by mouth 3 (three) times daily as needed for cough.    . Blood Glucose Calibration (ONETOUCH VERIO) SOLN Used to check meter accuracy (Patient taking differently: 1 each by Other route as needed. Use to calibrate meter prn) 1 each 0  . Blood Glucose Monitoring Suppl (ONETOUCH VERIO) w/Device KIT 1 each by Other route 2 (two) times daily.     . DULoxetine (CYMBALTA) 60 MG capsule Take 60 mg by mouth 2 (two) times daily.    . EMMarland KitchenALITY 120 MG/ML SOAJ ADMINISTER 1 ML UNDER THE SKIN EVERY 30 DAYS 1 mL 5  . glucose blood (ONETOUCH VERIO) test strip Check your blood sugar once a day.  vary the time of day when you check, between before the 3 meals, and at bedtime 200 strip 0  . hydrOXYzine (VISTARIL) 25 MG capsule Take 25 mg by mouth 3 (three) times daily as  needed for itching.    . insulin glargine, 2 Unit Dial, (TOUJEO MAX SOLOSTAR) 300 UNIT/ML Solostar Pen Inject 80 Units into the skin every morning. 84 mL 3  . lamoTRIgine (LAMICTAL) 100 MG tablet Take 100 mg by mouth daily.    Marland Kitchen lamoTRIgine (LAMICTAL) 25 MG tablet Take 50 mg by mouth daily.    . Lancets (ONETOUCH DELICA PLUS [id number]) MISC as directed 500 each 0  . levothyroxine (SYNTHROID) 175 MCG tablet Take 1 tablet (175 mcg total) by mouth daily. 90 tablet 3  .  lidocaine (LIDODERM) 5 % UNWRAP AND APPLY 1 PATCH TO SKIN DAILY FOR 15 DAYS. REMOVE AND DISCARD PATCH WITHIN 12 HOURS OR AS DIRECTED (Patient taking differently: Place 1 patch onto the skin daily as needed (pain).) 15 patch 0  . linaclotide (LINZESS) 290 MCG CAPS capsule Take 290 mcg by mouth daily before breakfast.     . Magnesium 250 MG TABS Take 750 mg by mouth at bedtime.    . montelukast (SINGULAIR) 10 MG tablet Take 10 mg by mouth daily.   3  . omeprazole (PRILOSEC) 40 MG capsule Take 40 mg by mouth 2 (two) times daily.     . pravastatin (PRAVACHOL) 20 MG tablet Take 20 mg by mouth daily.    . pregabalin (LYRICA) 50 MG capsule Take 100 mg by mouth daily.    . promethazine (PHENERGAN) 12.5 MG tablet Take 12.5 mg by mouth every 6 (six) hours as needed for nausea or vomiting.    Marland Kitchen QUEtiapine (SEROQUEL) 25 MG tablet Take 37.5 mg by mouth at bedtime.    . rizatriptan (MAXALT-MLT) 10 MG disintegrating tablet DISSOLVE 1 TABLET ON THE TONGUE AT ONSET OF HEADACHE. MAY REPEAT 1 TIME IN 2 HOURS AS NEEDED (Patient taking differently: Take 10 mg by mouth daily as needed for migraine.) 9 tablet 3  . triamcinolone (KENALOG) 0.1 % Apply 1 application topically 2 (two) times daily as needed (itching).    . valACYclovir (VALTREX) 500 MG tablet Take 500 mg by mouth daily.    Marland Kitchen zonisamide (ZONEGRAN) 50 MG capsule Take 1 capsule (50 mg total) by mouth daily. 30 capsule 5   No current facility-administered medications on file prior to visit.    ALLERGIES: Allergies  Allergen Reactions  . Eggs Or Egg-Derived Products Other (See Comments)    Don't eat eggs  . Ergotrate [Ergonovine] Other (See Comments)    Pt does not ever want  . Ibuprofen Other (See Comments)    Patient has ulcer  . Latex Other (See Comments)    Breaks out  . Metformin And Related Other (See Comments)    Dizziness  . Yutopar [Ritodrine] Other (See Comments)    Pt does not ever want    FAMILY HISTORY: Family History  Problem  Relation Age of Onset  . Hypertension Mother   . Diabetes Mother   . Cancer Mother        rectal  . Heart disease Father 26       MI x5  . Hypertension Father   . Diabetes Father   . Drug abuse Brother   . Alcohol abuse Brother   . Breast cancer Sister        unsure of age , was between 76 and 10  . Drug abuse Brother   . Alcohol abuse Brother   . Anxiety disorder Neg Hx   . Bipolar disorder Neg Hx   . Depression Neg Hx  Objective:  LMP 12/06/2016 (Approximate)  Blood pressure (!) 150/85, pulse (!) 108, height '5\' 7"'  (1.702 m), weight 207 lb (93.9 kg), last menstrual period 12/06/2016, SpO2 99 %. General: No acute distress.  Patient appears well-groomed.   Head:  Normocephalic/atraumatic Eyes:  Fundi examined but not visualized Neck: supple, no paraspinal tenderness, full range of motion Heart:  Regular rate and rhythm Lungs:  Clear to auscultation bilaterally Back: No paraspinal tenderness Neurological Exam: alert and oriented to person, place, and time. Attention span and concentration intact, recent and remote memory intact, fund of knowledge intact.  Speech fluent and not dysarthric, language intact.  CN II-XII intact. Bulk and tone normal, muscle strength 5/5 throughout.  Sensation to light touch, temperature and vibration intact.  Deep tendon reflexes 2+ throughout, toes downgoing.  Finger to nose and heel to shin testing intact.  Gait normal, Romberg negative.  CBC Latest Ref Rng & Units 09/11/2020 11/29/2018 09/03/2018  WBC 4.0 - 10.5 K/uL 7.7 8.5 5.8  Hemoglobin 12.0 - 15.0 g/dL 15.6(H) 15.9(H) 14.1  Hematocrit 36.0 - 46.0 % 46.9(H) 48.0(H) 45.0  Platelets 150 - 400 K/uL 241 355 272   CMP Latest Ref Rng & Units 09/11/2020 10/27/2019 11/29/2018  Glucose 70 - 99 mg/dL 406(H) 167(H) 258(H)  BUN 6 - 20 mg/dL '15 10 11  ' Creatinine 0.44 - 1.00 mg/dL 1.01(H) 0.91 1.12(H)  Sodium 135 - 145 mmol/L 133(L) 137 139  Potassium 3.5 - 5.1 mmol/L 3.9 4.1 3.3(L)  Chloride 98 - 111  mmol/L 98 103 104  CO2 22 - 32 mmol/L '24 28 22  ' Calcium 8.9 - 10.3 mg/dL 9.1 8.9 9.3  Total Protein 6.5 - 8.1 g/dL 7.5 - 7.7  Total Bilirubin 0.3 - 1.2 mg/dL 0.9 - 1.1  Alkaline Phos 38 - 126 U/L 188(H) - 314(H)  AST 15 - 41 U/L 21 - 43(H)  ALT 0 - 44 U/L 24 - 52(H)   TSH on 09/22/2020 was 62.72, with normal T4 at 0.75 Sharene Butters, PA-C  CC: Simona Huh, NP

## 2020-10-19 ENCOUNTER — Ambulatory Visit: Payer: 59 | Admitting: Physician Assistant

## 2020-10-23 ENCOUNTER — Ambulatory Visit (INDEPENDENT_AMBULATORY_CARE_PROVIDER_SITE_OTHER): Payer: 59 | Admitting: Physician Assistant

## 2020-10-23 ENCOUNTER — Encounter: Payer: Self-pay | Admitting: Physician Assistant

## 2020-10-23 ENCOUNTER — Other Ambulatory Visit: Payer: Self-pay

## 2020-10-23 VITALS — BP 162/89 | HR 85 | Ht 63.0 in | Wt 211.6 lb

## 2020-10-23 DIAGNOSIS — R519 Headache, unspecified: Secondary | ICD-10-CM | POA: Diagnosis not present

## 2020-10-23 MED ORDER — RIZATRIPTAN BENZOATE 10 MG PO TBDP: ORAL_TABLET | ORAL | 3 refills | Status: DC

## 2020-10-23 MED ORDER — ZONISAMIDE 50 MG PO CAPS: 50.0000 mg | ORAL_CAPSULE | Freq: Every day | ORAL | 5 refills | Status: DC

## 2020-10-23 NOTE — Progress Notes (Signed)
NEUROLOGY FOLLOW UP OFFICE NOTE  Kendra Hall [id number]  Assessment/Plan:   1.  Episode of facial twitching - brief, resolved - unclear etiology.  She has had at least two prior stroke-like episodes of (slurred speech with left sided weakness in 2016 and left sided facial numbness in 2019) with no correlating acute stroke.  Questionable if TIA vs migraine. It is not believed that  this most recent event was cerebrovascular.  Check MRI brain with and without contrast to rule out occult disease in view that the headaches are described differently from prior and due to above  2. Headache and fatigue - may be related to OSA.  Just restarted using CPAP - improving 3.  Incidental remote cerebellar infarct.  MRA of head in 2019 showed no vertebrobasilar insufficiency.  Continue ASA 73m daily. 4.  BPPV - resolved 5.   Keep Follow up in September as scheduled.  Subjective:   Kendra Hall a 89ear old woman with type 2 diabetes mellitus, hypothyroidism, fibromyalgia, OSA on CPAP, history of headache, anxiety  and depression who follows up for stroke-like event.  Patient is seen prior to her scheduled appointment, after an episode of confusion and incoherent speech happening on 10/11/2020. She reports being on the phone with another person, who witnessed the patient "not speaking coherent sentences, going in and out ".  Because of her prior history of TIA, patient wanted to be evaluated at our office- she has an ED visit on 09/12/2020 for left facial and mouth twitching  and slurred speech without unilateral numbness, weakness or headache.  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  The patient reports that "the twitching and slurred speech went away in April 5 ", her main complaint today is "pain in the head "described as sharp, stabbing, 6/10,  "all over ", comes about 4-5 times a day, lasting 5 to 20 minutes, and states that nothing makes it better or worse.  She also has seasonal allergies, "which make contribute to these ".  She took baclofen 2 days ago, and says "help a little bit ".  However, she has been skipping medications, especially zonisamide.  She continues to take Maxalt, and Emgality, although she forgets some doses.  She continues to take aspirin.  She also complains of dry eyes, but using eyedrops helps.  She denies any numbness, occasional tingling in the left hand, no recent falls or trauma.  Appetite is good, denies trouble swallowing.  The patient has multiple complaints, nonneurological, that are being addressed by her PCP. Of note her Hb A1C was over 12 and has an appointment with her endocrinologist regarding this..    Headaches:  Intensity:  Moderate to severe Duration:  5-20 mins Frequency:  4-5 times a day  Current NSAIDS: ASA 81 mg daily  Current analgesics:Hydrocodone most days for fibromyalgia but has been treating headaches with them as well Current triptans:Maxalt MLT 10 mg (takes it once a month) Current ergotamine:None Current anti-emetic:Promethazine12.568m Current muscle relaxants:baclofen prn  Current anti-anxiolytic:BuSpar Current sleep aide:None Current Antihypertensive medications:Would not use beta-blocker due to asthma Current Antidepressant medications:Cymbalta6035mID Current Anticonvulsant medications:zonisamide 51m82m bedtime, Lyrica 100mg26m Current anti-CGRP:Emgality  Current Vitamins/Herbal/Supplements:None Current Antihistamines/Decongestants:None Other therapy:None  Caffeine:No Diet:Drinks Exercise:Notroutine Depression:Yes; Anxiety:Yes Other pain:Neck pain, fibromyalgia Sleep hygiene:She underwent sleep study and was diagnosed with severe OSA.  She is using a CPAP.  Sleep is improved, as well as headaches.   HISTORY: Onset: She has had  history of migrainessince her 20s,but theybecame frequent in 2018. Location: Varies: back of head, across forehead, temples Quality: Varies: squeezing, sharp, knife like  Initial Intensity: 10/10 Aura: no Prodrome: no Postdrome: no Associated symptoms:Nausea, photophobia, phonophobia. She denies associated vomiting, visual disturbance, or unilateral numbness or weakness. It is not a new thunderclap headache. Initial Duration: All day (usually lets up in 30 minutes after taking Maxalt, however lately headache returns in 2 hours) Initial Frequency: Every other day Initial Frequency of abortive medication: every other day Triggers: None Relieving factors: Sleep Activity: aggravates  In July 2021, shebeganwaking up every morning with a headache, a severe squeezing on the top of her head. It tends to happen if she wakes up from a vivid dream. If she takes Maxalt, it aborts in 1 to 2 hours, otherwise it would last 1/2 a day. She has had a change in her sleep schedule beginning a couple of weeks ago in which she started sleeping during the day and has been up at night. She reports that she has had a formal eye exam this past year which was unremarkable.  Past NSAIDS/steroids: ibuprofen, prednisone (unable to take) Past analgesics: Tylenol, Excedrin Migraine Past abortive triptans: Sumatriptan tablet Past muscle relaxants: no Past anti-emetic: Zofran Past antihypertensive medications: no Past antidepressant medications: sertraline 1m,nortriptyline 770mas needed for sleep (cannot take nightly due to extreme dry mouth), Lexapro 2047mast anticonvulsant medications: topiramate immediate release 100m57mognitive problems),Topiramate ER 100 mg (effective) Past vitamins/Herbal/Supplements: no Other past therapies: no  Family history of headache: cousins   Imaging : MRI and MRA of head from 03/15/15 were personally reviewed and were unremarkable except for  minimal punctate white matter foci.    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 CT cervical spine from 01/09/18 showed cervical spondylosis with degenerative changesgreatest at C4-C7 levels with bilateral neural foraminal stenosis, as well as mild multifactorial C5-6 canal stenosis. She is treated by pain management.  PAST MEDICAL HISTORY: Past Medical History:  Diagnosis Date  . Allergy   . Anemia    history of  . Anxiety   . Asthma   . Depression   . Diabetes mellitus    type 2   . Fibromyalgia   . Gastric ulcer   . GERD (gastroesophageal reflux disease)   . Grave's disease   . Graves disease   . H/O therapeutic radiation   . Migraine   . Multiple thyroid nodules   . Myofacial muscle pain   . Osteoarthritis    Facet hypertrophy with injections  . TIA (transient ischemic attack)    02/2015    MEDICATIONS: Current Outpatient Medications on File Prior to Visit  Medication Sig Dispense Refill  . albuterol (PROAIR HFA) 108 (90 Base) MCG/ACT inhaler Inhale 2 puffs into the lungs every 4 (four) hours as needed for wheezing. 1 Inhaler 2  . albuterol (PROVENTIL) (2.5 MG/3ML) 0.083% nebulizer solution Take 3 mLs by nebulization 4 (four) times daily as needed for wheezing or shortness of breath.   3  . amLODipine (NORVASC) 2.5 MG tablet Take 2.5 mg by mouth daily.    . asMarland Kitchenirin EC 81 MG tablet Take 81 mg by mouth daily. Swallow whole.    . baclofen (LIORESAL) 10 MG tablet TAKE 1 TABLET BY MOUTH TWICE DAILY. CAUTION FOR DROWSINESS (Patient taking differently: Take 10 mg by  mouth daily as needed for muscle spasms.) 60 tablet 0  . BD PEN NEEDLE NANO 2ND GEN 32G X 4 MM MISC USE TO INJECT INSULIN THREE TIMES DAILY 100 each 0  . benzonatate (TESSALON) 100 MG capsule Take 100 mg by mouth 3 (three) times daily as needed for cough.    . Blood Glucose Calibration (ONETOUCH VERIO) SOLN Used to check meter accuracy (Patient taking differently: 1 each  by Other route as needed. Use to calibrate meter prn) 1 each 0  . Blood Glucose Monitoring Suppl (ONETOUCH VERIO) w/Device KIT 1 each by Other route 2 (two) times daily.     . DULoxetine (CYMBALTA) 60 MG capsule Take 60 mg by mouth 2 (two) times daily.    Marland Kitchen EMGALITY 120 MG/ML SOAJ ADMINISTER 1 ML UNDER THE SKIN EVERY 30 DAYS 1 mL 5  . famciclovir (FAMVIR) 500 MG tablet Take 500 mg by mouth 2 (two) times daily.    Marland Kitchen glucose blood (ONETOUCH VERIO) test strip Check your blood sugar once a day.  vary the time of day when you check, between before the 3 meals, and at bedtime 200 strip 0  . hydrOXYzine (VISTARIL) 25 MG capsule Take 25 mg by mouth 3 (three) times daily as needed for itching.    . insulin glargine, 2 Unit Dial, (TOUJEO MAX SOLOSTAR) 300 UNIT/ML Solostar Pen Inject 80 Units into the skin every morning. 84 mL 3  . lamoTRIgine (LAMICTAL) 100 MG tablet Take 100 mg by mouth daily.    . Lancets (ONETOUCH DELICA PLUS [id number]) MISC as directed 500 each 0  . levothyroxine (SYNTHROID) 175 MCG tablet Take 1 tablet (175 mcg total) by mouth daily. 90 tablet 3  . lidocaine (LIDODERM) 5 % UNWRAP AND APPLY 1 PATCH TO SKIN DAILY FOR 15 DAYS. REMOVE AND DISCARD PATCH WITHIN 12 HOURS OR AS DIRECTED (Patient taking differently: Place 1 patch onto the skin daily as needed (pain).) 15 patch 0  . linaclotide (LINZESS) 290 MCG CAPS capsule Take 290 mcg by mouth daily before breakfast.     . Magnesium 250 MG TABS Take 750 mg by mouth at bedtime.    . montelukast (SINGULAIR) 10 MG tablet Take 10 mg by mouth daily.   3  . omeprazole (PRILOSEC) 40 MG capsule Take 40 mg by mouth 2 (two) times daily.     . pravastatin (PRAVACHOL) 20 MG tablet Take 20 mg by mouth daily.    . pregabalin (LYRICA) 50 MG capsule Take 100 mg by mouth daily.    . promethazine (PHENERGAN) 12.5 MG tablet Take 12.5 mg by mouth every 6 (six) hours as needed for nausea or vomiting.    Marland Kitchen QUEtiapine (SEROQUEL) 25 MG tablet Take 37.5 mg by mouth  at bedtime.    . triamcinolone (KENALOG) 0.1 % Apply 1 application topically 2 (two) times daily as needed (itching).     No current facility-administered medications on file prior to visit.    ALLERGIES: Allergies  Allergen Reactions  . Eggs Or Egg-Derived Products Other (See Comments)    Don't eat eggs  . Ergotrate [Ergonovine] Other (See Comments)    Pt does not ever want  . Ibuprofen Other (See Comments)    Patient has ulcer  . Latex Other (See Comments)    Breaks out  . Metformin And Related Other (See Comments)    Dizziness  . Yutopar [Ritodrine] Other (See Comments)    Pt does not ever want    FAMILY HISTORY: Family History  Problem Relation Age of Onset  . Hypertension Mother   . Diabetes Mother   . Cancer Mother        rectal  . Heart disease Father 65       MI x5  . Hypertension Father   . Diabetes Father   . Drug abuse Brother   . Alcohol abuse Brother   . Breast cancer Sister        unsure of age , was between 59 and 61  . Drug abuse Brother   . Alcohol abuse Brother   . Anxiety disorder Neg Hx   . Bipolar disorder Neg Hx   . Depression Neg Hx       Objective:  BP (!) 162/89   Pulse 85   Ht '5\' 3"'  (1.6 m)   Wt 211 lb 9.6 oz (96 kg)   LMP 12/06/2016 (Approximate)   SpO2 96%   BMI 37.48 kg/m   General: No acute distress.  Patient appears well-groomed. Flat affect  Head:  Normocephalic/atraumatic Eyes:  Fundi examined but not visualized Neck: supple, no paraspinal tenderness, full range of motion Heart:  Regular rate and rhythm Lungs:  Clear to auscultation bilaterally Back: No paraspinal tenderness Neurological Exam: alert and oriented to person, place, and time. Attention span and concentration intact, recent and remote memory intact, fund of knowledge intact.  Speech fluent and not dysarthric, language intact.  CN II-XII intact. Bulk and tone normal, muscle strength 5/5 throughout.  Sensation to light touch, temperature and vibration intact.   Deep tendon reflexes 2+ throughout, toes downgoing.  Finger to nose and heel to shin testing intact.  Gait normal, Romberg negative.  CBC Latest Ref Rng & Units 09/11/2020 11/29/2018 09/03/2018  WBC 4.0 - 10.5 K/uL 7.7 8.5 5.8  Hemoglobin 12.0 - 15.0 g/dL 15.6(H) 15.9(H) 14.1  Hematocrit 36.0 - 46.0 % 46.9(H) 48.0(H) 45.0  Platelets 150 - 400 K/uL 241 355 272   CMP Latest Ref Rng & Units 09/11/2020 10/27/2019 11/29/2018  Glucose 70 - 99 mg/dL 406(H) 167(H) 258(H)  BUN 6 - 20 mg/dL '15 10 11  ' Creatinine 0.44 - 1.00 mg/dL 1.01(H) 0.91 1.12(H)  Sodium 135 - 145 mmol/L 133(L) 137 139  Potassium 3.5 - 5.1 mmol/L 3.9 4.1 3.3(L)  Chloride 98 - 111 mmol/L 98 103 104  CO2 22 - 32 mmol/L '24 28 22  ' Calcium 8.9 - 10.3 mg/dL 9.1 8.9 9.3  Total Protein 6.5 - 8.1 g/dL 7.5 - 7.7  Total Bilirubin 0.3 - 1.2 mg/dL 0.9 - 1.1  Alkaline Phos 38 - 126 U/L 188(H) - 314(H)  AST 15 - 41 U/L 21 - 43(H)  ALT 0 - 44 U/L 24 - 52(H)   TSH on 09/22/2020 was 62.72, with normal T4 at 0.75 Sharene Butters, PA-C  CC: Simona Huh, NP

## 2020-10-23 NOTE — Patient Instructions (Addendum)
   I am not sure exactly why there is headaches, slurred speech  MRI  did not show any new stroke there was evidence of a small old stroke but, as the symptoms continue, will reorder MRI brain to rule out any occult findings to explain   As instructed prior, would recommend restarting aspirin 81mg  daily.  Continue CPAP and we will see if headaches and fatigue improves  Continue taking the headache meds as prescribed so they can help you with the headaches. Refill for Maxalt and Zonezimide were sent to the pharmacy  Follow up in September as scheduled.

## 2020-10-25 ENCOUNTER — Ambulatory Visit: Payer: 59 | Admitting: Neurology

## 2020-10-26 ENCOUNTER — Other Ambulatory Visit: Payer: Self-pay

## 2020-10-26 ENCOUNTER — Ambulatory Visit (INDEPENDENT_AMBULATORY_CARE_PROVIDER_SITE_OTHER): Payer: 59 | Admitting: Endocrinology

## 2020-10-26 VITALS — BP 140/84 | HR 93 | Ht 63.0 in | Wt 216.6 lb

## 2020-10-26 DIAGNOSIS — E890 Postprocedural hypothyroidism: Secondary | ICD-10-CM | POA: Diagnosis not present

## 2020-10-26 LAB — TSH: TSH: 44 u[IU]/mL — ABNORMAL HIGH (ref 0.35–4.50)

## 2020-10-26 LAB — T4, FREE: Free T4: 0.38 ng/dL — ABNORMAL LOW (ref 0.60–1.60)

## 2020-10-26 MED ORDER — CLOTRIMAZOLE 10 MG MT TROC: 10.0000 mg | Freq: Every day | OROMUCOSAL | 0 refills | Status: DC

## 2020-10-26 MED ORDER — LEVOTHYROXINE SODIUM 200 MCG PO TABS: 200.0000 ug | ORAL_TABLET | Freq: Every day | ORAL | 3 refills | Status: DC

## 2020-10-26 NOTE — Progress Notes (Signed)
Subjective:    Patient ID: Kendra Hall, female    DOB: 07/24/1965, 55 y.o.   MRN: [id number]  HPI Pt returns for f/u of diabetes mellitus:  DM type: Insulin-requiring type 2 Dx'ed: 2035 Complications: renal insuff.    Therapy: insulin since 2020 GDM: 1984.   DKA: never.   Severe hypoglycemia: once, in 2018.  Pancreatitis: never.  Other: metformin caused dizziness; she also took insulin 2012-2018.  she did not tolerate invokana (vaginitis); renal insuff limits rx options; She stopped trulicity, due to nausea. Interval history: no cbg record, but states cbg's vary from 69-250.  she has not recently missed insulin.   Obesity: she took Victoza/Trulicity 5974-1638.  Pt wants gastric bypass, so she needs to have monthly ov's here.  Pt says she took saxenda in the past.   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  She takes synthroid as rx'ed.   Past Medical History:  Diagnosis Date  . Allergy   . Anemia    history of  . Anxiety   . Asthma   . Depression   . Diabetes mellitus    type 2   . Fibromyalgia   . Gastric ulcer   . GERD (gastroesophageal reflux disease)   . Grave's disease   . Graves disease   . H/O therapeutic radiation   . Migraine   . Multiple thyroid nodules   . Myofacial muscle pain   . Osteoarthritis    Facet hypertrophy with injections  . TIA (transient ischemic attack)    02/2015    Past Surgical History:  Procedure Laterality Date  . AXILLARY LYMPH NODE DISSECTION  2001  . CHOLECYSTECTOMY N/A 09/03/2018   Procedure: LAPAROSCOPIC CHOLECYSTECTOMY;  Surgeon: Coralie Keens, MD;  Location: [facility];  Service: General;  Laterality: N/A;  . HYDRADENITIS EXCISION    . TUBAL LIGATION  1995  . VAGINAL HYSTERECTOMY Left 03/11/2017   Procedure: HYSTERECTOMY VAGINAL W/LEFT PROXIMAL SALPINGECTOMY;  Surgeon: Princess Bruins, MD;  Location: [facility];  Service: Gynecology;  Laterality: Left;  request 7:30am OR  time  request 2 hours      Social History   Socioeconomic History  . Marital status: Single    Spouse name: Not on file  . Number of children: 4  . Years of education: 12  . Highest education level: Not on file  Occupational History  . Occupation: Research scientist (physical sciences): [employer]  Tobacco Use  . Smoking status: Current Every Day Smoker    Packs/day: 0.50    Years: 30.00    Pack years: 15.00    Types: Cigarettes    Last attempt to quit: 04/24/2018    Years since quitting: 2.5  . Smokeless tobacco: Never Used  Vaping Use  . Vaping Use: Never used  Substance and Sexual Activity  . Alcohol use: No    Comment: rare  . 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  Today denies all drug abuse  . Sexual activity: Not Currently    Birth control/protection: Surgical  Other Topics Concern  . Not on file  Social History Narrative   Lives alone in [place]. Her daugther occassoinally comes and stay with her. 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  originally from [place],  [place].  Raised by mom and her sister who is 8 yrs older than pt. Pt has several half siblings. No longer on disability. Pt has 4 kids, never married.          Social Determinants of Health   Financial Resource Strain: Not on file  Food Insecurity: Not on file  Transportation Needs: Not on file  Physical Activity: Not on file  Stress: Not on file  Social Connections: Not on file  Intimate Partner Violence: Not on file    Current Outpatient Medications on File Prior to Visit  Medication Sig Dispense Refill  . albuterol (PROAIR HFA) 108 (90 Base) MCG/ACT inhaler Inhale 2 puffs into the lungs every 4 (four) hours as needed for wheezing. 1 Inhaler 2  . albuterol (PROVENTIL) (2.5 MG/3ML) 0.083% nebulizer solution Take 3 mLs by nebulization 4 (four)  times daily as needed for wheezing or shortness of breath.   3  . amLODipine (NORVASC) 2.5 MG tablet Take 2.5 mg by mouth daily.    Marland Kitchen aspirin EC 81 MG tablet Take 81 mg by mouth daily. Swallow whole.    . baclofen (LIORESAL) 10 MG tablet TAKE 1 TABLET BY MOUTH TWICE DAILY. CAUTION FOR DROWSINESS (Patient taking differently: Take 10 mg by mouth daily as needed for muscle spasms.) 60 tablet 0  . BD PEN NEEDLE NANO 2ND GEN 32G X 4 MM MISC USE TO INJECT INSULIN THREE TIMES DAILY 100 each 0  . benzonatate (TESSALON) 100 MG capsule Take 100 mg by mouth 3 (three) times daily as needed for cough.    . Blood Glucose Calibration (ONETOUCH VERIO) SOLN Used to check meter accuracy (Patient taking differently: 1 each by Other route as needed. Use to calibrate meter prn) 1 each 0  . Blood Glucose Monitoring Suppl (ONETOUCH VERIO) w/Device KIT 1 each by Other route 2 (two) times daily.     . DULoxetine (CYMBALTA) 60 MG capsule Take 60 mg by mouth 2 (two) times daily.    Marland Kitchen EMGALITY 120 MG/ML SOAJ ADMINISTER 1 ML UNDER THE SKIN EVERY 30 DAYS 1 mL 5  . famciclovir (FAMVIR) 500 MG tablet Take 500 mg by mouth 2 (two) times daily.    Marland Kitchen glucose blood (ONETOUCH VERIO) test strip Check your blood sugar once a day.  vary the time of day when you check, between before the 3 meals, and at bedtime 200 strip 0  . hydrOXYzine (VISTARIL) 25 MG capsule Take 25 mg by mouth 3 (three) times daily as needed for itching.    . insulin glargine, 2 Unit Dial, (TOUJEO MAX SOLOSTAR) 300 UNIT/ML Solostar Pen Inject 80 Units into the skin every morning. 84 mL 3  . lamoTRIgine (LAMICTAL) 100 MG tablet Take 100 mg by mouth daily.    . Lancets (ONETOUCH DELICA PLUS [id number]) MISC as directed 500 each 0  . lidocaine (LIDODERM) 5 % UNWRAP AND APPLY 1 PATCH TO SKIN DAILY FOR 15 DAYS. REMOVE AND DISCARD PATCH WITHIN 12 HOURS OR AS DIRECTED (Patient taking differently: Place 1 patch onto the skin daily as needed (pain).) 15 patch 0  . linaclotide  (LINZESS) 290 MCG CAPS capsule Take 290 mcg by mouth daily before breakfast.     . Magnesium 250 MG TABS Take 750 mg by mouth at bedtime.    . montelukast (SINGULAIR) 10 MG tablet Take 10 mg by mouth daily.   3  . omeprazole (PRILOSEC) 40 MG capsule Take 40 mg by mouth 2 (two) times daily.     Marland Kitchen  pravastatin (PRAVACHOL) 20 MG tablet Take 20 mg by mouth daily.    . pregabalin (LYRICA) 50 MG capsule Take 100 mg by mouth daily.    . promethazine (PHENERGAN) 12.5 MG tablet Take 12.5 mg by mouth every 6 (six) hours as needed for nausea or vomiting.    Marland Kitchen QUEtiapine (SEROQUEL) 25 MG tablet Take 37.5 mg by mouth at bedtime.    . rizatriptan (MAXALT-MLT) 10 MG disintegrating tablet DISSOLVE 1 TABLET ON THE TONGUE AT ONSET OF HEADACHE. MAY REPEAT 1 TIME IN 2 HOURS AS NEEDED 9 tablet 3  . triamcinolone (KENALOG) 0.1 % Apply 1 application topically 2 (two) times daily as needed (itching).    . zonisamide (ZONEGRAN) 50 MG capsule Take 1 capsule (50 mg total) by mouth daily. 30 capsule 5   No current facility-administered medications on file prior to visit.    Allergies  Allergen Reactions  . Eggs Or Egg-Derived Products Other (See Comments)    Don't eat eggs  . Ergotrate [Ergonovine] Other (See Comments)    Pt does not ever want  . Ibuprofen Other (See Comments)    Patient has ulcer  . Latex Other (See Comments)    Breaks out  . Metformin And Related Other (See Comments)    Dizziness  . Yutopar [Ritodrine] Other (See Comments)    Pt does not ever want    Family History  Problem Relation Age of Onset  . Hypertension Mother   . Diabetes Mother   . Cancer Mother        rectal  . Heart disease Father 50       MI x5  . Hypertension Father   . Diabetes Father   . Drug abuse Brother   . Alcohol abuse Brother   . Breast cancer Sister        unsure of age , was between 55 and 78  . Drug abuse Brother   . Alcohol abuse Brother   . Anxiety disorder Neg Hx   . Bipolar disorder Neg Hx   .  Depression Neg Hx     BP 140/84 (BP Location: Right Arm, Patient Position: Sitting, Cuff Size: Large)   Pulse 93   Ht _0  (1.6 m)   Wt 216 lb 9.6 oz (98.2 kg)   LMP 12/06/2016 (Approximate)   SpO2 99%   BMI 38.37 kg/m    Review of Systems Pt says her tongue is dry.  She say she has thrush.      Objective:   Physical Exam VITAL SIGNS:  See vs page GENERAL: no distress Tongue: normal  Lab Results  Component Value Date   TSH 44.00 (H) 10/26/2020   T3TOTAL 113.37 03/27/2019      Assessment & Plan:  Tongue sxs, poss due to thrush Hypothyroidism, uncontrolled.  Usually due to noncompliance.  Insulin-requiring type 2 DM: uncertain glycemic control.  Obesity, persistent.   Patient Instructions  Please continue the same insulin. I have sent a prescription to your pharmacy for thrush.   Blood tests are requested for you today.  We'll let you know about the results.  check your blood sugar once a day.  vary the time of day when you check, between before the 3 meals, and at bedtime.  also check if you have symptoms of your blood sugar being too high or too low.  please keep a record of the readings and bring it to your next appointment here (or you can bring the meter itself).  You  can write it on any piece of paper.  please call us sooner if your blood sugar goes below 70, or if you have a lot of readings over 200. For your weight, try walking every day.      Please come back for a follow-up appointment in 1 month.

## 2020-10-26 NOTE — Patient Instructions (Addendum)
Please continue the same insulin. I have sent a prescription to your pharmacy for thrush.   Blood tests are requested for you today.  We'll let you know about the results.  check your blood sugar once a day.  vary the time of day when you check, between before the 3 meals, and at bedtime.  also check if you have symptoms of your blood sugar being too high or too low.  please keep a record of the readings and bring it to your next appointment here (or you can bring the meter itself).  You can write it on any piece of paper.  please call us sooner if your blood sugar goes below 70, or if you have a lot of readings over 200. For your weight, try walking every day.      Please come back for a follow-up appointment in 1 month.

## 2020-11-11 ENCOUNTER — Other Ambulatory Visit: Payer: Self-pay

## 2020-11-11 ENCOUNTER — Ambulatory Visit
Admission: RE | Admit: 2020-11-11 | Discharge: 2020-11-11 | Disposition: A | Discharge status: Home / Self Care | Payer: 59 | Source: Ambulatory Visit | Attending: Neurology | Admitting: Neurology

## 2020-11-11 DIAGNOSIS — R519 Headache, unspecified: Secondary | ICD-10-CM

## 2020-11-11 MED ORDER — GADOBENATE DIMEGLUMINE 529 MG/ML IV SOLN
20.0000 mL | Freq: Once | INTRAVENOUS | Status: AC | PRN
  Administered 2020-11-11: 20 mL via INTRAVENOUS

## 2020-11-13 NOTE — Progress Notes (Signed)
Pt advised of her MRI results.

## 2020-11-14 ENCOUNTER — Other Ambulatory Visit: Payer: Self-pay

## 2020-11-14 DIAGNOSIS — R202 Paresthesia of skin: Secondary | ICD-10-CM

## 2020-11-15 ENCOUNTER — Other Ambulatory Visit: Payer: Self-pay | Admitting: Orthopedic Surgery

## 2020-11-15 DIAGNOSIS — R2231 Localized swelling, mass and lump, right upper limb: Secondary | ICD-10-CM

## 2020-11-22 ENCOUNTER — Other Ambulatory Visit: Payer: Self-pay

## 2020-11-22 ENCOUNTER — Ambulatory Visit: Payer: 59 | Admitting: Neurology

## 2020-11-22 ENCOUNTER — Ambulatory Visit (INDEPENDENT_AMBULATORY_CARE_PROVIDER_SITE_OTHER): Payer: 59 | Admitting: Endocrinology

## 2020-11-22 VITALS — BP 130/82 | HR 83 | Ht 63.0 in | Wt 212.2 lb

## 2020-11-22 DIAGNOSIS — E039 Hypothyroidism, unspecified: Secondary | ICD-10-CM | POA: Diagnosis not present

## 2020-11-22 DIAGNOSIS — E119 Type 2 diabetes mellitus without complications: Secondary | ICD-10-CM

## 2020-11-22 DIAGNOSIS — Z794 Long term (current) use of insulin: Secondary | ICD-10-CM | POA: Diagnosis not present

## 2020-11-22 DIAGNOSIS — R202 Paresthesia of skin: Secondary | ICD-10-CM

## 2020-11-22 DIAGNOSIS — G5603 Carpal tunnel syndrome, bilateral upper limbs: Secondary | ICD-10-CM

## 2020-11-22 LAB — POCT GLYCOSYLATED HEMOGLOBIN (HGB A1C): Hemoglobin A1C: 10.2 % — AB (ref 4.0–5.6)

## 2020-11-22 LAB — TSH: TSH: 32.31 u[IU]/mL — ABNORMAL HIGH (ref 0.35–4.50)

## 2020-11-22 LAB — T4, FREE: Free T4: 0.49 ng/dL — ABNORMAL LOW (ref 0.60–1.60)

## 2020-11-22 MED ORDER — TOUJEO MAX SOLOSTAR 300 UNIT/ML ~~LOC~~ SOPN: 90.0000 [IU] | PEN_INJECTOR | SUBCUTANEOUS | 3 refills | Status: DC

## 2020-11-22 NOTE — Patient Instructions (Addendum)
Please increase the insulin to 90 units each morning.  check your blood sugar once a day.  vary the time of day when you check, between before the 3 meals, and at bedtime.  also check if you have symptoms of your blood sugar being too high or too low.  please keep a record of the readings and bring it to your next appointment here (or you can bring the meter itself).  You can write it on any piece of paper.  please call us sooner if your blood sugar goes below 70, or if you have a lot of readings over 200.      Please come back for a follow-up appointment in 3 months.

## 2020-11-22 NOTE — Procedures (Signed)
[facility]  [place], [place]  [place], [place] [address] Tel: [phone] Fax:  [phone] Test Date:  11/22/2020  Patient: Kendra Hall DOB: May 12, 1966 Physician: Narda Amber, DO  Sex: Female Height: 5\' 3"  Ref Phys: Daryll Brod, MD  ID#: [id number]   Technician:    Patient Complaints: This is a 55 year old female referred for evaluation of right hand pain and paresthesias.  NCV & EMG Findings: Extensive electrodiagnostic testing of the right upper extremity and additional studies of the left shows:  1. Bilateral median sensory responses show prolonged latency (L4.3, R4.0 ms).  Bilateral ulnar sensory responses are within normal limits.   2. Bilateral median and ulnar motor responses are within normal limits.   3. There is no evidence of active or chronic motor axonal loss changes affecting any of the tested muscles.  Motor unit configuration and recruitment pattern is within normal limits.  Needle electrode examination of the left upper extremity was terminated due to pain and strong withdrawal response upon needle insertion, compromising provider safety.    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    ___________________________ Narda Amber, DO    Nerve Conduction Studies Anti Sensory Summary Table   Stim Site NR Peak (ms) Norm Peak (ms) P-T Amp (V) Norm P-T Amp  Left Median Anti Sensory (2nd Digit)  33C  Wrist    4.3 <3.6 21.8 >15  Right Median Anti Sensory (2nd Digit)  33C  Wrist    4.0 <3.6 18.2 >15  Left Ulnar Anti Sensory (5th Digit)  33C  Wrist    2.8 <3.1 34.5 >10  Right Ulnar Anti Sensory (5th Digit)  33C  Wrist    2.6 <3.1 37.8 >10   Motor Summary Table   Stim Site NR Onset (ms) Norm Onset (ms) O-P Amp (mV) Norm O-P Amp Site1 Site2 Delta-0 (ms) Dist (cm) Vel (m/s) Norm Vel (m/s)  Left Median Motor (Abd Poll Brev)  33C  Wrist    3.9 <4.0  9.5 >6 Elbow Wrist 5.3 28.0 53 >50  Elbow    9.2  8.9         Right Median Motor (Abd Poll Brev)  33C  Wrist    3.8 <4.0 10.3 >6 Elbow Wrist 5.0 29.0 58 >50  Elbow    8.8  9.7         Left Ulnar Motor (Abd Dig Minimi)  33C  Wrist    2.1 <3.1 8.6 >7 B Elbow Wrist 4.0 23.0 58 >50  B Elbow    6.1  8.0  A Elbow B Elbow 1.9 10.0 53 >50  A Elbow    8.0  8.0         Right Ulnar Motor (Abd Dig Minimi)  33C  Wrist    2.3 <3.1 9.8 >7 B Elbow Wrist 3.8 22.0 58 >50  B Elbow    6.1  9.1  A Elbow B Elbow 1.9 10.0 53 >50  A Elbow    8.0  8.8          EMG   Side Muscle Ins Act Fibs Psw Fasc Number Recrt Dur Dur. Amp Amp. Poly Poly. Comment  Right 1stDorInt Nml Nml Nml Nml Nml Nml Nml Nml Nml Nml Nml Nml N/A  Right Abd Poll Brev Nml Nml Nml Nml Nml Nml Nml Nml Nml Nml Nml Nml N/A  Right PronatorTeres Nml Nml Nml Nml Nml  Nml Nml Nml Nml Nml Nml Nml N/A  Right Biceps Nml Nml Nml Nml Nml Nml Nml Nml Nml Nml Nml Nml N/A  Right Triceps Nml Nml Nml Nml Nml Nml Nml Nml Nml Nml Nml Nml N/A  Right Deltoid Nml Nml Nml Nml Nml Nml Nml Nml Nml Nml Nml Nml N/A  Left PronatorTeres Nml Nml Nml Nml Nml Nml Nml Nml Nml Nml Nml Nml N/A      Waveforms:

## 2020-11-22 NOTE — Progress Notes (Signed)
Subjective:    Patient ID: Kendra Hall, female    DOB: 11/30/1965, 55 y.o.   MRN: [id number]  HPI Pt returns for f/u of diabetes mellitus:  DM type: Insulin-requiring type 2 Dx'ed: 0539 Complications: renal insuff.    Therapy: insulin since 2020 GDM: 1984.   DKA: never.   Severe hypoglycemia: once, in 2018.  Pancreatitis: never.  Other: metformin caused dizziness; she also took insulin 2012-2018.  she did not tolerate invokana (vaginitis); renal insuff limits rx options; She stopped trulicity, due to nausea; she declines multiple daily injections.   Interval history: no cbg record, but states cbg's vary from 76-258.  she has not recently missed insulin.    Obesity: she took Victoza/Trulicity 7673-4193.  Pt wants gastric bypass, so she needs to have monthly ov's here.  Pt says she took saxenda in the past.   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  She takes synthroid as rx'ed.   Past Medical History:  Diagnosis Date  . Allergy   . Anemia    history of  . Anxiety   . Asthma   . Depression   . Diabetes mellitus    type 2   . Fibromyalgia   . Gastric ulcer   . GERD (gastroesophageal reflux disease)   . Grave's disease   . Graves disease   . H/O therapeutic radiation   . Migraine   . Multiple thyroid nodules   . Myofacial muscle pain   . Osteoarthritis    Facet hypertrophy with injections  . TIA (transient ischemic attack)    02/2015    Past Surgical History:  Procedure Laterality Date  . AXILLARY LYMPH NODE DISSECTION  2001  . CHOLECYSTECTOMY N/A 09/03/2018   Procedure: LAPAROSCOPIC CHOLECYSTECTOMY;  Surgeon: Coralie Keens, MD;  Location: [facility];  Service: General;  Laterality: N/A;  . HYDRADENITIS EXCISION    . TUBAL LIGATION  1995  . VAGINAL HYSTERECTOMY Left 03/11/2017   Procedure: HYSTERECTOMY VAGINAL W/LEFT PROXIMAL SALPINGECTOMY;  Surgeon: Princess Bruins, MD;  Location: [facility];  Service: Gynecology;   Laterality: Left;  request 7:30am OR time  request 2 hours      Social History   Socioeconomic History  . Marital status: Single    Spouse name: Not on file  . Number of children: 4  . Years of education: 21  . Highest education level: Not on file  Occupational History  . Occupation: Research scientist (physical sciences): [employer]  Tobacco Use  . Smoking status: Current Every Day Smoker    Packs/day: 0.50    Years: 30.00    Pack years: 15.00    Types: Cigarettes    Last attempt to quit: 04/24/2018    Years since quitting: 2.5  . Smokeless tobacco: Never Used  Vaping Use  . Vaping Use: Never used  Substance and Sexual Activity  . Alcohol use: No    Comment: rare  . 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  Today denies all drug abuse  . Sexual activity: Not Currently    Birth control/protection: Surgical  Other Topics Concern  . Not on file  Social History Narrative   Lives alone in [place]. Her daugther occassoinally comes and stay with her. Pt is working Therapist, art at [employer]. Enjoys reading and pt has an associates in  applied sciences.  originally from [place],  [place].  Raised by mom and her sister who is 8 yrs older than pt. Pt has several half siblings. No longer on disability. Pt has 4 kids, never married.          Social Determinants of Health   Financial Resource Strain: Not on file  Food Insecurity: Not on file  Transportation Needs: Not on file  Physical Activity: Not on file  Stress: Not on file  Social Connections: Not on file  Intimate Partner Violence: Not on file    Current Outpatient Medications on File Prior to Visit  Medication Sig Dispense Refill  . albuterol (PROAIR HFA) 108 (90 Base) MCG/ACT inhaler Inhale 2 puffs into the lungs every 4 (four) hours as needed for wheezing. 1 Inhaler 2  . albuterol (PROVENTIL) (2.5 MG/3ML) 0.083% nebulizer solution  Take 3 mLs by nebulization 4 (four) times daily as needed for wheezing or shortness of breath.   3  . amLODipine (NORVASC) 2.5 MG tablet Take 2.5 mg by mouth daily.    Marland Kitchen aspirin EC 81 MG tablet Take 81 mg by mouth daily. Swallow whole.    . baclofen (LIORESAL) 10 MG tablet TAKE 1 TABLET BY MOUTH TWICE DAILY. CAUTION FOR DROWSINESS (Patient taking differently: Take 10 mg by mouth daily as needed for muscle spasms.) 60 tablet 0  . BD PEN NEEDLE NANO 2ND GEN 32G X 4 MM MISC USE TO INJECT INSULIN THREE TIMES DAILY 100 each 0  . benzonatate (TESSALON) 100 MG capsule Take 100 mg by mouth 3 (three) times daily as needed for cough.    . Blood Glucose Calibration (ONETOUCH VERIO) SOLN Used to check meter accuracy (Patient taking differently: 1 each by Other route as needed. Use to calibrate meter prn) 1 each 0  . Blood Glucose Monitoring Suppl (ONETOUCH VERIO) w/Device KIT 1 each by Other route 2 (two) times daily.     . clotrimazole (MYCELEX) 10 MG troche Take 1 tablet (10 mg total) by mouth 5 (five) times daily. 35 Troche 0  . DULoxetine (CYMBALTA) 60 MG capsule Take 60 mg by mouth 2 (two) times daily.    Marland Kitchen EMGALITY 120 MG/ML SOAJ ADMINISTER 1 ML UNDER THE SKIN EVERY 30 DAYS 1 mL 5  . famciclovir (FAMVIR) 500 MG tablet Take 500 mg by mouth 2 (two) times daily.    Marland Kitchen glucose blood (ONETOUCH VERIO) test strip Check your blood sugar once a day.  vary the time of day when you check, between before the 3 meals, and at bedtime 200 strip 0  . hydrOXYzine (VISTARIL) 25 MG capsule Take 25 mg by mouth 3 (three) times daily as needed for itching.    . lamoTRIgine (LAMICTAL) 100 MG tablet Take 100 mg by mouth daily.    . Lancets (ONETOUCH DELICA PLUS [id number]) MISC as directed 500 each 0  . levothyroxine (SYNTHROID) 200 MCG tablet Take 1 tablet (200 mcg total) by mouth daily. 90 tablet 3  . lidocaine (LIDODERM) 5 % UNWRAP AND APPLY 1 PATCH TO SKIN DAILY FOR 15 DAYS. REMOVE AND DISCARD PATCH WITHIN 12 HOURS OR AS  DIRECTED (Patient taking differently: Place 1 patch onto the skin daily as needed (pain).) 15 patch 0  . linaclotide (LINZESS) 290 MCG CAPS capsule Take 290 mcg by mouth daily before breakfast.     . Magnesium 250 MG TABS Take 750 mg by mouth at bedtime.    . montelukast (SINGULAIR) 10 MG tablet Take 10 mg by  mouth daily.   3  . omeprazole (PRILOSEC) 40 MG capsule Take 40 mg by mouth 2 (two) times daily.     . pravastatin (PRAVACHOL) 20 MG tablet Take 20 mg by mouth daily.    . pregabalin (LYRICA) 50 MG capsule Take 100 mg by mouth daily.    . promethazine (PHENERGAN) 12.5 MG tablet Take 12.5 mg by mouth every 6 (six) hours as needed for nausea or vomiting.    Marland Kitchen QUEtiapine (SEROQUEL) 25 MG tablet Take 37.5 mg by mouth at bedtime.    . rizatriptan (MAXALT-MLT) 10 MG disintegrating tablet DISSOLVE 1 TABLET ON THE TONGUE AT ONSET OF HEADACHE. MAY REPEAT 1 TIME IN 2 HOURS AS NEEDED 9 tablet 3  . triamcinolone (KENALOG) 0.1 % Apply 1 application topically 2 (two) times daily as needed (itching).    . zonisamide (ZONEGRAN) 50 MG capsule Take 1 capsule (50 mg total) by mouth daily. 30 capsule 5   No current facility-administered medications on file prior to visit.    Allergies  Allergen Reactions  . Eggs Or Egg-Derived Products Other (See Comments)    Don't eat eggs  . Ergotrate [Ergonovine] Other (See Comments)    Pt does not ever want  . Ibuprofen Other (See Comments)    Patient has ulcer  . Latex Other (See Comments)    Breaks out  . Metformin And Related Other (See Comments)    Dizziness  . Yutopar [Ritodrine] Other (See Comments)    Pt does not ever want    Family History  Problem Relation Age of Onset  . Hypertension Mother   . Diabetes Mother   . Cancer Mother        rectal  . Heart disease Father 59       MI x5  . Hypertension Father   . Diabetes Father   . Drug abuse Brother   . Alcohol abuse Brother   . Breast cancer Sister        unsure of age , was between 42 and 78   . Drug abuse Brother   . Alcohol abuse Brother   . Anxiety disorder Neg Hx   . Bipolar disorder Neg Hx   . Depression Neg Hx     BP 130/82 (BP Location: Right Arm, Patient Position: Sitting, Cuff Size: Large)   Pulse 83   Ht _0  (1.6 m)   Wt 212 lb 3.2 oz (96.3 kg)   LMP 12/06/2016 (Approximate)   SpO2 98%   BMI 37.59 kg/m    Review of Systems     Objective:   Physical Exam VITAL SIGNS:  See vs page GENERAL: no distress Pulses: dorsalis pedis intact bilat.   MSK: no deformity of the feet CV: no leg edema Skin:  no ulcer on the feet.  normal color and temp on the feet. Neuro: sensation is intact to touch on the feet.      A1c=12.2%    Assessment & Plan:  Insulin-requiring type 2 DM: uncontrolled Hypothyroidism: recheck today  Patient Instructions  Please increase the insulin to 90 units each morning.  check your blood sugar once a day.  vary the time of day when you check, between before the 3 meals, and at bedtime.  also check if you have symptoms of your blood sugar being too high or too low.  please keep a record of the readings and bring it to your next appointment here (or you can bring the meter itself).  You can write  it on any piece of paper.  please call us sooner if your blood sugar goes below 70, or if you have a lot of readings over 200.      Please come back for a follow-up appointment in 3 months.

## 2020-11-28 ENCOUNTER — Other Ambulatory Visit: Payer: Self-pay | Admitting: Endocrinology

## 2020-12-14 ENCOUNTER — Ambulatory Visit: Payer: 59 | Admitting: Endocrinology

## 2020-12-30 ENCOUNTER — Other Ambulatory Visit: Payer: Self-pay | Admitting: Neurology

## 2021-01-07 ENCOUNTER — Other Ambulatory Visit: Payer: Self-pay | Admitting: Neurology

## 2021-01-18 ENCOUNTER — Other Ambulatory Visit: Payer: Self-pay | Admitting: Orthopedic Surgery

## 2021-01-18 DIAGNOSIS — R2231 Localized swelling, mass and lump, right upper limb: Secondary | ICD-10-CM

## 2021-02-03 ENCOUNTER — Ambulatory Visit
Admission: RE | Admit: 2021-02-03 | Discharge: 2021-02-03 | Disposition: A | Discharge status: Home / Self Care | Payer: 59 | Source: Ambulatory Visit | Attending: Orthopedic Surgery | Admitting: Orthopedic Surgery

## 2021-02-03 ENCOUNTER — Other Ambulatory Visit: Payer: Self-pay

## 2021-02-03 DIAGNOSIS — R2231 Localized swelling, mass and lump, right upper limb: Secondary | ICD-10-CM

## 2021-02-05 ENCOUNTER — Other Ambulatory Visit: Payer: Self-pay | Admitting: Endocrinology

## 2021-02-06 ENCOUNTER — Telehealth: Payer: Self-pay | Admitting: Neurology

## 2021-02-06 NOTE — Telephone Encounter (Signed)
LVM returning call.   Was going to clarify a couple things with her:  Needing to know if she was able to get set up with new machine via [employer] and when she was set up. We will also need to get her set up for follow up appt 31-90 days from set up date for continued insurance coverage.  We also need to know what machine she has.

## 2021-02-06 NOTE — Telephone Encounter (Signed)
Pt returned call and I was able to ask what machine she was set up with. She states that she was set up on [employer] machine. She is due for her 31-90 day follow up. Went ahead and set the patient up with a initial cpap visit so pt can be seen and reminded her to bring her machine and power cord to the visit.   At this time pt will bring the loaner machine we provided her.

## 2021-02-06 NOTE — Telephone Encounter (Signed)
Pt would like a call to discuss returning the CPAP she borrowed .  Pt states she would like to discuss the order the DME needs for the calibrating of her new CPAP, please call.

## 2021-02-07 ENCOUNTER — Ambulatory Visit: Payer: 59 | Admitting: Family Medicine

## 2021-02-07 ENCOUNTER — Encounter: Payer: Self-pay | Admitting: Family Medicine

## 2021-02-07 NOTE — Progress Notes (Deleted)
PATIENT: Kendra Hall DOB: [birth year]  REASON FOR VISIT: follow up HISTORY FROM: patient  No chief complaint on file.    HISTORY OF PRESENT ILLNESS: 02/07/21 Kendra Hall:  Kendra Hall is a 55 y.o. female here today for follow up for OSA on CPAP. HST 02/2020 confirmed severe OSA with AHI of 61.3/hr with prolonged, severe hypoxia at almost 3 hours of desaturation time. Due to national backorder of CPAP machines, she was offered a [employer]. She received her CPAP in      HISTORY: (copied from Dr Dohmeier's previous note)  Kendra Hall is a 55 y.o.African American female patient and seen here upon request by Dr Tomi Likens for a sleep evaluation/  a on 02/10/2020 - this is not a transfer of care.    Chief concern according to patient :  " I am woken up by headaches and a I wake up with morning headaches - may be headaches are sleep related "   I have the pleasure of seeing Kendra Hall today, a right-handed [place] American female with a possible sleep disorder.  She   has a past medical history of Allergy, Anemia, Anxiety, Asthma, Depression, Diabetes mellitus,Gastric ulcer, GERD (gastroesophageal reflux disease), hypothyroidism -Graves disease, H/O therapeutic radiation, Multiple thyroid nodules, Myofacial muscle pain, Osteoarthritis, and Morbid Obesity, TIA (transient ischemic attack).   Sleep relevant medical history: Nocturia/ 2-3 times on average,  Night terrors, thyroid radiation, migraine headaches.   Family medical /sleep history:  sister with OSA, another sister with insomnia.   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  The patient currently works daytime. 8 AM - 8 Pm.   Pets are not present. Tobacco use; quit 2019 , but is smoking again.  ETOH use: none ,  Caffeine intake in form of Coffee( /) Soda( 24 ounces a day ) Tea ( 36 ounces a day) , no energy drinks. Regular exercise; none.   Sleep  habits are as follows: The patient's dinner time is between 6-9 PM. The patient goes to bed at 11-12 PM and continues to sleep for 1-2 hours, wakes from headaches and for  bathroom breaks, the first time at 1-2 AM.  She struggles to go back to sleep. Has averaged approximately 4-5 hours of nocturnal sleep.  Her mind is racing.  Bedroom is dark, cool, quiet.  The preferred sleep position is prone and on her sides.  Sleeps  with the support of 6 pillows. Likes to be propped up.   Dreams are reportedly frequent/vividly and she is often  Lucid.  Very realistic. These wake her too. Nightmares. .  10.30 AM is the usual rise time. Works begins at 11 AM. The patient wakes up with an alarm.  She reports not feeling refreshed or restored in AM, with symptoms such as dry mouth and morning headaches , and residual fatigue.  Naps are taken frequently when she is off work. , lasting from 60-180 minutes and less refreshing than nocturnal sleep.    REVIEW OF SYSTEMS: Out of a complete 14 system review of symptoms, the patient complains only of the following symptoms, and Kendra Hall other reviewed systems are negative.  ESS: FSS:  ALLERGIES: Allergies  Allergen Reactions   Eggs Or Egg-Derived Products Other (See Comments)    Don't eat eggs   Ergotrate [Ergonovine] Other (See Comments)    Pt does not ever want   Ibuprofen Other (  See Comments)    Patient has ulcer   Latex Other (See Comments)    Breaks out   Metformin And Related Other (See Comments)    Dizziness   Yutopar [Ritodrine] Other (See Comments)    Pt does not ever want    HOME MEDICATIONS: Outpatient Medications Prior to Visit  Medication Sig Dispense Refill   albuterol (PROAIR HFA) 108 (90 Base) MCG/ACT inhaler Inhale 2 puffs into the lungs every 4 (four) hours as needed for wheezing. 1 Inhaler 2   albuterol (PROVENTIL) (2.5 MG/3ML) 0.083% nebulizer solution Take 3 mLs by nebulization 4 (four) times daily as needed for wheezing or shortness of  breath.   3   amLODipine (NORVASC) 2.5 MG tablet Take 2.5 mg by mouth daily.     aspirin EC 81 MG tablet Take 81 mg by mouth daily. Swallow whole.     baclofen (LIORESAL) 10 MG tablet TAKE 1 TABLET BY MOUTH TWICE DAILY. CAUTION FOR DROWSINESS 60 tablet 0   BD PEN NEEDLE NANO 2ND GEN 32G X 4 MM MISC USE TO INJECT LANTUS UNDER THE SKIN EVERY MORNING 100 each 3   benzonatate (TESSALON) 100 MG capsule Take 100 mg by mouth 3 (three) times daily as needed for cough.     Blood Glucose Calibration (ONETOUCH VERIO) SOLN Used to check meter accuracy (Patient taking differently: 1 each by Other route as needed. Use to calibrate meter prn) 1 each 0   Blood Glucose Monitoring Suppl (ONETOUCH VERIO) w/Device KIT 1 each by Other route 2 (two) times daily.      clotrimazole (MYCELEX) 10 MG troche Take 1 tablet (10 mg total) by mouth 5 (five) times daily. 35 Troche 0   DULoxetine (CYMBALTA) 60 MG capsule Take 60 mg by mouth 2 (two) times daily.     EMGALITY 120 MG/ML SOAJ ADMINISTER 1 ML UNDER THE SKIN EVERY 30 DAYS 1 mL 5   famciclovir (FAMVIR) 500 MG tablet Take 500 mg by mouth 2 (two) times daily.     glucose blood (ONETOUCH VERIO) test strip Check your blood sugar once a day.  vary the time of day when you check, between before the 3 meals, and at bedtime 200 strip 0   hydrOXYzine (VISTARIL) 25 MG capsule Take 25 mg by mouth 3 (three) times daily as needed for itching.     insulin glargine, 2 Unit Dial, (TOUJEO MAX SOLOSTAR) 300 UNIT/ML Solostar Pen Inject 90 Units into the skin every morning. 30 mL 3   lamoTRIgine (LAMICTAL) 100 MG tablet Take 100 mg by mouth daily.     Lancets (ONETOUCH DELICA PLUS [id number]) MISC USE TO TEST BLOOD GLUCOSE ONCE DAILY, ALTERNATING TESTING TIMES 500 each 0   levothyroxine (SYNTHROID) 200 MCG tablet Take 1 tablet (200 mcg total) by mouth daily. 90 tablet 3   lidocaine (LIDODERM) 5 % UNWRAP AND APPLY 1 PATCH TO SKIN DAILY FOR 15 DAYS. REMOVE AND DISCARD PATCH WITHIN 12 HOURS OR  AS DIRECTED (Patient taking differently: Place 1 patch onto the skin daily as needed (pain).) 15 patch 0   linaclotide (LINZESS) 290 MCG CAPS capsule Take 290 mcg by mouth daily before breakfast.      Magnesium 250 MG TABS Take 750 mg by mouth at bedtime.     montelukast (SINGULAIR) 10 MG tablet Take 10 mg by mouth daily.   3   omeprazole (PRILOSEC) 40 MG capsule Take 40 mg by mouth 2 (two) times daily.      pravastatin (PRAVACHOL)  20 MG tablet Take 20 mg by mouth daily.     pregabalin (LYRICA) 50 MG capsule Take 100 mg by mouth daily.     promethazine (PHENERGAN) 12.5 MG tablet Take 12.5 mg by mouth every 6 (six) hours as needed for nausea or vomiting.     QUEtiapine (SEROQUEL) 25 MG tablet Take 37.5 mg by mouth at bedtime.     rizatriptan (MAXALT-MLT) 10 MG disintegrating tablet DISSOLVE 1 TABLET ON THE TONGUE AT ONSET OF HEADACHE. MAY REPEAT 1 TIME IN 2 HOURS AS NEEDED 9 tablet 3   triamcinolone (KENALOG) 0.1 % Apply 1 application topically 2 (two) times daily as needed (itching).     zonisamide (ZONEGRAN) 50 MG capsule Take 1 capsule (50 mg total) by mouth daily. 30 capsule 5   No facility-administered medications prior to visit.    PAST MEDICAL HISTORY: Past Medical History:  Diagnosis Date   Allergy    Anemia    history of   Anxiety    Asthma    Depression    Diabetes mellitus    type 2    Fibromyalgia    Gastric ulcer    GERD (gastroesophageal reflux disease)    Grave's disease    Graves disease    H/O therapeutic radiation    Migraine    Multiple thyroid nodules    Myofacial muscle pain    Osteoarthritis    Facet hypertrophy with injections   TIA (transient ischemic attack)    02/2015    PAST SURGICAL HISTORY: Past Surgical History:  Procedure Laterality Date   AXILLARY LYMPH NODE DISSECTION  2001   CHOLECYSTECTOMY N/A 09/03/2018   Procedure: LAPAROSCOPIC CHOLECYSTECTOMY;  Surgeon: Coralie Keens, MD;  Location: [facility];  Service: General;  Laterality: N/A;    [place] Left 03/11/2017   Procedure: HYSTERECTOMY VAGINAL W/LEFT PROXIMAL SALPINGECTOMY;  Surgeon: Princess Bruins, MD;  Location: [facility];  Service: Gynecology;  Laterality: Left;  request 7:30am OR time  request 2 hours      FAMILY HISTORY: Family History  Problem Relation Age of Onset   Hypertension Mother    Diabetes Mother    Cancer Mother        rectal   Heart disease Father 48       MI x5   Hypertension Father    Diabetes Father    Drug abuse Brother    Alcohol abuse Brother    Breast cancer Sister        unsure of age , was between 34 and 52   Drug abuse Brother    Alcohol abuse Brother    Anxiety disorder Neg Hx    Bipolar disorder Neg Hx    Depression Neg Hx     SOCIAL HISTORY: Social History   Socioeconomic History   Marital status: Single    Spouse name: Not on file   Number of children: 4   Years of education: 14   Highest education level: Not on file  Occupational History   Occupation: customer service    Employer: [employer]  Tobacco Use   Smoking status: Every Day    Packs/day: 0.50    Years: 30.00    Pack years: 15.00    Types: Cigarettes    Last attempt to quit: 04/24/2018    Years since quitting: 2.7   Smokeless tobacco: Never  Vaping Use   Vaping Use: Never used  Substance and Sexual  Activity   Alcohol use: No    Comment: rare   Drug use: No    Comment: 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  Today denies Kendra Hall drug abuse   Sexual activity: Not Currently    Birth control/protection: Surgical  Other Topics Concern   Not on file  Social History Narrative   Lives alone in [place]. Her daugther occassoinally comes and stay with her. 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  originally from [place],  [place].  Raised by mom and her sister who is  8 yrs older than pt. Pt has several half siblings. No longer on disability. Pt has 4 kids, never married.          Social Determinants of Health   Financial Resource Strain: Not on file  Food Insecurity: Not on file  Transportation Needs: Not on file  Physical Activity: Not on file  Stress: Not on file  Social Connections: Not on file  Intimate Partner Violence: Not on file     PHYSICAL EXAM  There were no vitals filed for this visit. There is no height or weight on file to calculate BMI.  Generalized: Well developed, in no acute distress  Cardiology: normal rate and rhythm, no murmur noted Respiratory: clear to auscultation bilaterally  Neurological examination  Mentation: Alert oriented to time, place, history taking. Follows Kendra Hall commands speech and language fluent Cranial nerve II-XII: Pupils were equal round reactive to light. Extraocular movements were full, visual field were full  Motor: The motor testing reveals 5 over 5 strength of Kendra Hall 4 extremities. Good symmetric motor tone is noted throughout.  Gait and station: Gait is normal.    DIAGNOSTIC DATA (LABS, IMAGING, TESTING) - I reviewed patient records, labs, notes, testing and imaging myself where available.  MMSE - Mini Mental State Exam 10/15/2016  Orientation to time 5  Orientation to Place 5  Registration 3  Attention/ Calculation 5  Recall 3  Language- name 2 objects 2  Language- repeat 1  Language- follow 3 step command 3  Language- read & follow direction 1  Write a sentence 1  Copy design 1  Total score 30     Lab Results  Component Value Date   WBC 7.7 09/11/2020   HGB 15.6 (H) 09/11/2020   HCT 46.9 (H) 09/11/2020   MCV 82.9 09/11/2020   PLT 241 09/11/2020      Component Value Date/Time   NA 133 (L) 09/11/2020 1944   K 3.9 09/11/2020 1944   CL 98 09/11/2020 1944   CO2 24 09/11/2020 1944   GLUCOSE 406 (H) 09/11/2020 1944   BUN 15 09/11/2020 1944   CREATININE 1.01 (H) 09/11/2020 1944    CREATININE 1.06 07/28/2015 1230   CALCIUM 9.1 09/11/2020 1944   PROT 7.5 09/11/2020 1944   ALBUMIN 3.9 09/11/2020 1944   AST 21 09/11/2020 1944   ALT 24 09/11/2020 1944   ALKPHOS 188 (H) 09/11/2020 1944   BILITOT 0.9 09/11/2020 1944   GFRNONAA >60 09/11/2020 1944   GFRNONAA 62 07/28/2015 1230   GFRAA >60 11/29/2018 1729   GFRAA 71 07/28/2015 1230   Lab Results  Component Value Date   CHOL 122 (L) 02/02/2016   HDL 41 (L) 02/02/2016   LDLCALC 55 02/02/2016   LDLDIRECT 60 11/08/2011   TRIG 131 02/02/2016   CHOLHDL 3.0 02/02/2016  Lab Results  Component Value Date   HGBA1C 10.2 (A) 11/22/2020   No results found for: VITAMINB12 Lab Results  Component Value Date   TSH 32.31 (H) 11/22/2020     ASSESSMENT AND PLAN 55 y.o. year old female  has a past medical history of Allergy, Anemia, Anxiety, Asthma, Depression, Diabetes mellitus, Fibromyalgia, Gastric ulcer, GERD (gastroesophageal reflux disease), Grave's disease, Graves disease, H/O therapeutic radiation, Migraine, Multiple thyroid nodules, Myofacial muscle pain, Osteoarthritis, and TIA (transient ischemic attack). here with   No diagnosis found.    Kendra Hall is doing well on CPAP therapy. Compliance report reveals ***. *** was encouraged to continue using CPAP nightly and for greater than 4 hours each night. We will update supply orders as indicated. Risks of untreated sleep apnea review and education materials provided. Healthy lifestyle habits encouraged. *** will follow up in ***, sooner if needed. *** verbalizes understanding and agreement with this plan.    No orders of the defined types were placed in this encounter.    No orders of the defined types were placed in this encounter.     Debbora Presto, FNP-C 02/07/2021, 7:12 AM [facility] [address] [place], [place] [address] [phone]

## 2021-02-07 NOTE — Telephone Encounter (Signed)
Called pt. She showed up late for appt today, front desk r/s her for December but she will need appt asap. I r/s her appt for tomorrow at 745am w/ AL,NP. Advised pt to check in by 715am. She verbalized understanding.

## 2021-02-08 ENCOUNTER — Ambulatory Visit: Payer: 59 | Admitting: Family Medicine

## 2021-02-14 ENCOUNTER — Encounter: Payer: Self-pay | Admitting: Neurology

## 2021-02-14 ENCOUNTER — Ambulatory Visit (INDEPENDENT_AMBULATORY_CARE_PROVIDER_SITE_OTHER): Payer: 59 | Admitting: Neurology

## 2021-02-14 VITALS — BP 179/93 | HR 90 | Ht 64.0 in | Wt 217.0 lb

## 2021-02-14 DIAGNOSIS — G4733 Obstructive sleep apnea (adult) (pediatric): Secondary | ICD-10-CM | POA: Diagnosis not present

## 2021-02-14 DIAGNOSIS — G43701 Chronic migraine without aura, not intractable, with status migrainosus: Secondary | ICD-10-CM

## 2021-02-14 DIAGNOSIS — G4734 Idiopathic sleep related nonobstructive alveolar hypoventilation: Secondary | ICD-10-CM | POA: Diagnosis not present

## 2021-02-14 NOTE — Progress Notes (Signed)
SLEEP MEDICINE CLINIC    Provider:  Larey Seat, MD  Primary Care Physician:  Simona Huh, NP [place] [id number]     Referring Provider:  Pleas Koch, DO        Chief Complaint according to patient   Patient presents with:     New Patient (Initial Visit)           HISTORY OF PRESENT ILLNESS:  Kendra Hall is a 55 y.o.African American female patient and seen here upon request by Dr Tomi Likens for a RV on  02/14/2021 - this is not a transfer of care.   Patient was tested for SA and diagnosed with severe sleep apnea.  She underwent testing on 03-15-2020 and the home slept sleep test confirmed an AHI of 61.3/h during REM sleep a little bit higher to 78/h so this was severe also she had prolonged hypoxia for almost 3 hours of total desaturation time during her sleep time.  Be suspected that she may need BiPAP given the high AHI but I ordered an auto titration CPAP first she has done remarkably well her usage days were 29 out of 30 for the last 30 days, she is using it 70% of the time required that means the average hours of 4.7 hours at night just enough to not do with insurance coverage.  She is using a lunar machine and the pressure at the 95th percentile is 13.4 cmH2O her residual AHI is 7.0 with no central apneas arising the pressure range is 5 through 15 cmH2O but I would like to do is to give her actually 2 cm more in order to reduce the restrictive and obstructive apnea.-I hope that will reduce the AHI to 5 or less, and hope to further increase her compliance.  She reports insomnia, and she reports being severely heat intolerant. Mrs. Mikkelsen has a history of migraines but she has evolved into cluster headaches and also wakes up with morning headaches and dry mouth feeling never refreshed and restored.   Her sleep has been fragmented for a long time by vivid dreams by nocturia and by cluster headaches.  She has relapsed into smoking but she does not smoke a  pack a day.    The patient was seen in a video visit by Dr.Jeffrey she also has a history of TIA 09-11-2020-multiple thyroid nodules with Graves' disease status post radiation therapy, I reviewed her medication she is an insulin-dependent diabetic who takes Phenergan , Lyrica, Maxalt.    Chief Effect according to patient : " also a had a TIA in March 2022. I am sleeping so much better with CPAP and I have less daytime sleepiness, fewer bathroom."  " I am no longer woken up by headaches and a I will no longer wake up with morning headaches - may be headaches are sleep related "   I have the pleasure of seeing Kendra Hall today, a right-handed [place] American female with a possible sleep disorder.  She has a past medical history of Allergy, Anemia, Anxiety, Asthma, Depression, Diabetes mellitus,Gastric ulcer, GERD (gastroesophageal reflux disease), hypothyroidism -Graves disease, H/O therapeutic radiation, Multiple thyroid nodules, Myofacial muscle pain, Osteoarthritis, and Morbid Obesity, TIA (transient ischemic attack).     Sleep relevant medical history: Nocturia/ 2-3 times on average,  Night terrors, thyroid radiation, migraine headaches.   Family medical /sleep history:  sister with OSA, another sister with insomnia.   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  The patient currently works daytime. 8 AM - 8 Pm.   Pets are not present. Tobacco use; quit 2019 , but is smoking again.  ETOH use: none ,  Caffeine intake in form of Coffee( /) Soda( 24 ounces a day ) Tea ( 36 ounces a day) , no energy drinks. Regular exercise; none.      Sleep habits are as follows: The patient's dinner time is between 6-9 PM. The patient goes to bed at 11-12 PM and continues to sleep for 1-2 hours, wakes from headaches and for  bathroom breaks, the first time at 1-2 AM.  She struggles to go back to sleep. Has averaged  approximately 4-5 hours of nocturnal sleep.  Her mind is racing.  Bedroom is dark, cool, quiet.  The preferred sleep position is prone and on her sides.  Sleeps  with the support of 6 pillows. Likes to be propped up.   Dreams are reportedly frequent/vividly and she is often  Lucid.  Very realistic. These wake her too. Nightmares. .  10.30 AM is the usual rise time. Works begins at 11 AM. The patient wakes up with an alarm.  She reports not feeling refreshed or restored in AM, with symptoms such as dry mouth and morning headaches , and residual fatigue.  Naps are taken frequently when she is off work. , lasting from 60-180 minutes and less refreshing than nocturnal sleep.    Review of Systems: Out of a complete 14 system review, the patient complains of only the following symptoms, and all other reviewed systems are negative.:  Fatigue, sleepiness , snoring, fragmented sleep, Insomnia -vertigo. the patient reports vivid dreams of nightmarish and she wakes up at the end of a lucid dreams sometimes being not quite sure if she dreams or if this is reality.  She also reports waking up with headaches and from headaches.  The headaches she wakes up with sometimes are associated with nausea the headaches at night that may wake her up and more sharp and more stabbing and given her history of migraines these are very likely cluster headaches.  Cluster headaches are seen in patients with hypoxemia at night and therefore correlate stronger with the presence of obstructive sleep apnea.   How likely are you to doze in the following situations: 0 = not likely, 1 = slight chance, 2 = moderate chance, 3 = high chance   Sitting and Reading? Watching Television? Sitting inactive in a public place (theater or meeting)? As a passenger in a car for an hour without a break? Lying down in the afternoon when circumstances permit? Sitting and talking to someone? Sitting quietly after lunch without alcohol? In a car,  while stopped for a few minutes in traffic?   Total =  post CPAP 12 / 24   Pre CPAP 14/ 24 points   FSS endorsed at  54/ 63 points post CPAP.  Excessive fatigue pre CPAP was 63/ 63 points .  Cluster stabbing pain.  Shoulder pain. Neck pain, carpal tunnel.   Social History   Socioeconomic History   Marital status: Single    Spouse name: Not on file   Number of children: 4   Years of education: 14   Highest education level: Not on file  Occupational History   Occupation: customer service    Employer: [employer]  Tobacco Use   Smoking status: Every Day    Packs/day: 0.50  Years: 30.00    Pack years: 15.00    Types: Cigarettes    Last attempt to quit: 04/24/2018    Years since quitting: 2.8   Smokeless tobacco: Never  Vaping Use   Vaping Use: Never used  Substance and Sexual Activity   Alcohol use: No    Comment: rare   Drug use: No    Comment: 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  Today denies all drug abuse   Sexual activity: Not Currently    Birth control/protection: Surgical  Other Topics Concern   Not on file  Social History Narrative   Lives alone in [place]. Her daugther occassoinally comes and stay with her. 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  originally from [place],  [place].  Raised by mom and her sister who is 8 yrs older than pt. Pt has several half siblings. No longer on disability. Pt has 4 kids, never married.          Social Determinants of Health   Financial Resource Strain: Not on file  Food Insecurity: Not on file  Transportation Needs: Not on file  Physical Activity: Not on file  Stress: Not on file  Social Connections: Not on file    Family History  Problem Relation Age of Onset   Hypertension Mother    Diabetes Mother    Cancer Mother        rectal   Heart disease Father 79       MI x5    Hypertension Father    Diabetes Father    Drug abuse Brother    Alcohol abuse Brother    Breast cancer Sister        unsure of age , was between 39 and 44   Drug abuse Brother    Alcohol abuse Brother    Anxiety disorder Neg Hx    Bipolar disorder Neg Hx    Depression Neg Hx     Past Medical History:  Diagnosis Date   Allergy    Anemia    history of   Anxiety    Asthma    Depression    Diabetes mellitus    type 2    Fibromyalgia    Gastric ulcer    GERD (gastroesophageal reflux disease)    Grave's disease    Graves disease    H/O therapeutic radiation    Migraine    Multiple thyroid nodules    Myofacial muscle pain    Osteoarthritis    Facet hypertrophy with injections   TIA (transient ischemic attack)    02/2015    Past Surgical History:  Procedure Laterality Date   AXILLARY LYMPH NODE DISSECTION  2001   CHOLECYSTECTOMY N/A 09/03/2018   Procedure: LAPAROSCOPIC CHOLECYSTECTOMY;  Surgeon: Coralie Keens, MD;  Location: [facility];  Service: General;  Laterality: N/A;   Sunflower Left 03/11/2017   Procedure: HYSTERECTOMY VAGINAL W/LEFT PROXIMAL SALPINGECTOMY;  Surgeon: Princess Bruins, MD;  Location: [facility];  Service: Gynecology;  Laterality: Left;  request 7:30am OR time  request 2 hours       Current Outpatient Medications on File Prior to Visit  Medication Sig Dispense Refill   albuterol (PROAIR HFA) 108 (90 Base) MCG/ACT inhaler Inhale 2 puffs into the lungs every 4 (four)  hours as needed for wheezing. 1 Inhaler 2   albuterol (PROVENTIL) (2.5 MG/3ML) 0.083% nebulizer solution Take 3 mLs by nebulization 4 (four) times daily as needed for wheezing or shortness of breath.   3   amLODipine (NORVASC) 2.5 MG tablet Take 2.5 mg by mouth daily.     aspirin EC 81 MG tablet Take 81 mg by mouth daily. Swallow whole.     baclofen (LIORESAL) 10 MG tablet TAKE 1 TABLET BY MOUTH TWICE DAILY. CAUTION FOR  DROWSINESS 60 tablet 0   BD PEN NEEDLE NANO 2ND GEN 32G X 4 MM MISC USE TO INJECT LANTUS UNDER THE SKIN EVERY MORNING 100 each 3   benzonatate (TESSALON) 200 MG capsule Take 200 mg by mouth 3 (three) times daily as needed.     Blood Glucose Calibration (ONETOUCH VERIO) SOLN Used to check meter accuracy (Patient taking differently: 1 each by Other route as needed. Use to calibrate meter prn) 1 each 0   Blood Glucose Monitoring Suppl (ONETOUCH VERIO) w/Device KIT 1 each by Other route 2 (two) times daily.      clotrimazole (MYCELEX) 10 MG troche Take 1 tablet (10 mg total) by mouth 5 (five) times daily. 35 Troche 0   DULoxetine (CYMBALTA) 60 MG capsule Take 60 mg by mouth 2 (two) times daily.     EMGALITY 120 MG/ML SOAJ ADMINISTER 1 ML UNDER THE SKIN EVERY 30 DAYS 1 mL 5   famciclovir (FAMVIR) 500 MG tablet Take 500 mg by mouth 2 (two) times daily.     glucose blood (ONETOUCH VERIO) test strip Check your blood sugar once a day.  vary the time of day when you check, between before the 3 meals, and at bedtime 200 strip 0   hydrOXYzine (VISTARIL) 25 MG capsule Take 25 mg by mouth 3 (three) times daily as needed for itching.     insulin glargine, 2 Unit Dial, (TOUJEO MAX SOLOSTAR) 300 UNIT/ML Solostar Pen Inject 90 Units into the skin every morning. 30 mL 3   lamoTRIgine (LAMICTAL) 100 MG tablet Take 100 mg by mouth daily.     Lancets (ONETOUCH DELICA PLUS HMCNOB09G) MISC USE TO TEST BLOOD GLUCOSE ONCE DAILY, ALTERNATING TESTING TIMES 500 each 0   levothyroxine (SYNTHROID) 200 MCG tablet Take 1 tablet (200 mcg total) by mouth daily. 90 tablet 3   lidocaine (LIDODERM) 5 % UNWRAP AND APPLY 1 PATCH TO SKIN DAILY FOR 15 DAYS. REMOVE AND DISCARD PATCH WITHIN 12 HOURS OR AS DIRECTED (Patient taking differently: Place 1 patch onto the skin daily as needed (pain).) 15 patch 0   linaclotide (LINZESS) 290 MCG CAPS capsule Take 290 mcg by mouth daily before breakfast.      Magnesium 250 MG TABS Take 750 mg by mouth  at bedtime.     mirtazapine (REMERON) 7.5 MG tablet Take 7.5 mg by mouth at bedtime.     montelukast (SINGULAIR) 10 MG tablet Take 10 mg by mouth daily.   3   omeprazole (PRILOSEC) 40 MG capsule Take 40 mg by mouth 2 (two) times daily.      pravastatin (PRAVACHOL) 20 MG tablet Take 20 mg by mouth daily.     pregabalin (LYRICA) 50 MG capsule Take 100 mg by mouth daily.     promethazine (PHENERGAN) 12.5 MG tablet Take 12.5 mg by mouth every 6 (six) hours as needed for nausea or vomiting.     rizatriptan (MAXALT-MLT) 10 MG disintegrating tablet DISSOLVE 1 TABLET ON THE TONGUE AT ONSET  OF HEADACHE. MAY REPEAT 1 TIME IN 2 HOURS AS NEEDED 9 tablet 3   triamcinolone (KENALOG) 0.1 % Apply 1 application topically 2 (two) times daily as needed (itching).     zonisamide (ZONEGRAN) 50 MG capsule Take 1 capsule (50 mg total) by mouth daily. (Patient not taking: Reported on 02/14/2021) 30 capsule 5   No current facility-administered medications on file prior to visit.    Allergies  Allergen Reactions   Eggs Or Egg-Derived Products Other (See Comments)    Don't eat eggs   Ergotrate [Ergonovine] Other (See Comments)    Pt does not ever want   Ibuprofen Other (See Comments)    Patient has ulcer   Latex Other (See Comments)    Breaks out   Metformin And Related Other (See Comments)    Dizziness   Yutopar [Ritodrine] Other (See Comments)    Pt does not ever want    Physical exam:  Today's Vitals   02/14/21 0820  BP: (!) 179/93  Pulse: 90  Weight: 217 lb (98.4 kg)  Height: '5\' 4"'  (1.626 m)   Body mass index is 37.25 kg/m.   Wt Readings from Last 3 Encounters:  02/14/21 217 lb (98.4 kg)  11/22/20 212 lb 3.2 oz (96.3 kg)  10/26/20 216 lb 9.6 oz (98.2 kg)     Ht Readings from Last 3 Encounters:  02/14/21 '5\' 4"'  (1.626 m)  11/22/20 '5\' 3"'  (1.6 m)  10/26/20 '5\' 3"'  (1.6 m)      General: The patient is awake, alert and appears not in acute distress. The patient is groomed. Head: Normocephalic,  atraumatic.  Neck is supple. Mallampati 3 plus, macroglossia.  ,  neck circumference:16 inches . Nasal airflow congested-  Retrognathia is not noted.   Dental status: poor.  Cardiovascular:  Regular rate and cardiac rhythm by pulse,  w ithout distended neck veins. Respiratory: Lungs are clear to auscultation.  Skin:  Without evidence of ankle edema, or rash. Trunk: The patient's posture is erect.   Neurologic exam : The patient is awake and alert, oriented to place and time.   Memory subjective described as intact.  Attention span & concentration ability appears normal.  Speech is fluent,  without  dysarthria, dysphonia or aphasia.  Mood and affect are appropriate.   Cranial nerves: no loss of smell or taste reported  Pupils are equal and briskly reactive to light.  Facial motor strength is symmetric and tongue and uvula move midline.  Motor exam:  Symmetric bulk, tone and ROM.     Sensory:   was normal.   Gait and station: Patient could rise unassisted from a seated position, walked without assistive device.  Stance is of normal width/ base .  Toe and heel walk were deferred.  Deep tendon reflexes: in the  upper and lower extremities are symmetric and intact.  Babinski response was deferred.      After spending a total time of  25 minutes face to face and additional time for physical and neurologic examination, review of laboratory studies,  personal review of imaging studies, reports and results of other testing and review of referral information / records as far as provided in visit, I have established the following assessments:    My Plan is to proceed with:  1)  Severe hypersomnia was related to severe OSA and hypoxia, patient can still increase compliance . Significant improvement in sleep quality, headache frequency, nocturia frequency and fatigue is also somewhat improved.  She likes to  use a FFM, with magnetic clips.  She is needing an adjustment in pressure , 6-17 cm  water, 3 cm EPR.  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 I plan to follow up either personally or through our NP within 9-12  month.    Electronically signed by: Larey Seat, MD 02/14/2021 8:41 AM  [facility] Board certified by The [employer] of Sleep Medicine and Diplomate of the [employer] of Sleep Medicine. Board certified In Neurology through the [employer], Fellow of the [employer] of Neurology. Medical Director of [facility].

## 2021-02-14 NOTE — Patient Instructions (Signed)
Sleep Apnea Sleep apnea affects breathing during sleep. It causes breathing to stop for 10 seconds or more, or to become shallow. People with sleep apnea usually snoreloudly. It can also increase the risk of: Heart attack. Stroke. Being very overweight (obese). Diabetes. Heart failure. Irregular heartbeat. High blood pressure. The goal of treatment is to help you breathe normally again. What are the causes?  The most common cause of this condition is a collapsed or blocked airway. There are three kinds of sleep apnea: Obstructive sleep apnea. This is caused by a blocked or collapsed airway. Central sleep apnea. This happens when the brain does not send the right signals to the muscles that control breathing. Mixed sleep apnea. This is a combination of obstructive and central sleep apnea. What increases the risk? Being overweight. Smoking. Having a small airway. Being older. Being female. Drinking alcohol. Taking medicines to calm yourself (sedatives or tranquilizers). Having family members with the condition. Having a tongue or tonsils that are larger than normal. What are the signs or symptoms? Trouble staying asleep. Loud snoring. Headaches in the morning. Waking up gasping. Dry mouth or sore throat in the morning. Being sleepy or tired during the day. If you are sleepy or tired during the day, you may also: Not be able to focus your mind (concentrate). Forget things. Get angry a lot and have mood swings. Feel sad (depressed). Have changes in your personality. Have less interest in sex, if you are female. Be unable to have an erection, if you are female. How is this treated?  Sleeping on your side. Using a medicine to get rid of mucus in your nose (decongestant). Avoiding the use of alcohol, medicines to help you relax, or certain pain medicines (narcotics). Losing weight, if needed. Changing your diet. Quitting smoking. Using a machine to open your airway while you  sleep, such as: An oral appliance. This is a mouthpiece that shifts your lower jaw forward. A CPAP device. This device blows air through a mask when you breathe out (exhale). An EPAP device. This has valves that you put in each nostril. A BPAP device. This device blows air through a mask when you breathe in (inhale) and breathe out. Having surgery if other treatments do not work. Follow these instructions at home: Lifestyle Make changes that your doctor recommends. Eat a healthy diet. Lose weight if needed. Avoid alcohol, medicines to help you relax, and some pain medicines. Do not smoke or use any products that contain nicotine or tobacco. If you need help quitting, ask your doctor. General instructions Take over-the-counter and prescription medicines only as told by your doctor. If you were given a machine to use while you sleep, use it only as told by your doctor. If you are having surgery, make sure to tell your doctor you have sleep apnea. You may need to bring your device with you. Keep all follow-up visits. Contact a doctor if: The machine that you were given to use during sleep bothers you or does not seem to be working. You do not get better. You get worse. Get help right away if: Your chest hurts. You have trouble breathing in enough air. You have an uncomfortable feeling in your back, arms, or stomach. You have trouble talking. One side of your body feels weak. A part of your face is hanging down. These symptoms may be an emergency. Get help right away. Call your local emergency services (911 in the [place] Do not wait to see if the symptoms   will go away. Do not drive yourself to the hospital. Summary This condition affects breathing during sleep. The most common cause is a collapsed or blocked airway. The goal of treatment is to help you breathe normally while you sleep. This information is not intended to replace advice given to you by your health care provider. Make  sure you discuss any questions you have with your healthcare provider. Document Revised: 05/19/2020 Document Reviewed: 05/19/2020 Elsevier Patient Education  2022 [employer]  

## 2021-02-22 ENCOUNTER — Ambulatory Visit: Payer: 59 | Admitting: Endocrinology

## 2021-03-05 ENCOUNTER — Other Ambulatory Visit: Payer: Self-pay | Admitting: Neurology

## 2021-03-07 NOTE — Progress Notes (Deleted)
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  MRA of head in 2019 revealed no vertebrobasilar insufficiency  Subjective:  Kendra Hall is a 55 year old woman with type 2 diabetes mellitus, hypothyroidism, fibromyalgia and depression who follows up for stroke-like event.   UPDATE: In April, she had another episode of confusion and incoherent speech.  She was on the phone and the other person was unable to understand what she was saying.  She was advised to restart ASA 39m daily.  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   Intensity:  Moderate to severe Duration:  1 to 2 hours Frequency:  1 to 2 days a month Current NSAIDS:  ASA 878mdaily Current analgesics:  Hydrocodone most days for fibromyalgia but has been treating headaches with them as well Current triptans: Maxalt MLT 10 mg Current ergotamine: None Current anti-emetic: Promethazine 12.45m345murrent muscle relaxants:  baclofen Current anti-anxiolytic:  BuSpar Current sleep aide: None Current Antihypertensive medications: Would not use beta-blocker due to asthma Current Antidepressant medications:  Cymbalta 58m39mD Current Anticonvulsant medications: zonisamide 50mg27mbedtime, Lyrica 100mg 92mCurrent anti-CGRP:  Emgality  Current Vitamins/Herbal/Supplements: None Current Antihistamines/Decongestants: None Other therapy: None   Caffeine: No Diet: Drinks Exercise: Not routine Depression: Yes; Anxiety: Yes Other pain: Neck pain, fibromyalgia Sleep hygiene: She  underwent sleep study and was diagnosed with severe OSA.  She is using a CPAP.  Sleep is improved, as well as headaches.    HISTORY: Onset: She has had history of migraines since her 20s, b57sthey became frequent in 2018. Location:  Varies: back of head, across forehead, temples Quality:  Varies: squeezing, sharp Initial Intensity:  10/10 Aura:  no Prodrome:  no Postdrome:  no Associated symptoms: Nausea, photophobia, phonophobia.  She denies associated vomiting, visual disturbance, or unilateral numbness or weakness.  It is not a new thunderclap headache. Initial Duration:  All day (usually lets up in 30 minutes after taking Maxalt, however lately headache returns in 2 hours) Initial Frequency:  Every other day Initial Frequency of abortive medication: every other day Triggers:  None Relieving factors: Sleep Activity:  aggravates  On 09/11/2020, she went to the ED for twitching on the left side of her mouth lasting a second.  For the previous two days, she was experiencing intermittent slurred speech.  No unilateral numbness, weakness or headache.  CT head unremarkable.  Follow up MRI of brain pshowed chronic small vessel ischemic changes with old right cerebellar stroke (new compared to prior imaging from 09/26/2017) but no acute abnormality.  She stayed out of work for the rest of last week.  On Monday and Tuesday she reports having a brief episode of speech disturbance, trouble saying the correct word.  She has had some headache.    Past NSAIDS/steroids:  ibuprofen, prednisone (unable to take0 Past analgesics:  Tylenol, Excedrin Migraine Past abortive triptans:  Sumatriptan tablet Past muscle relaxants:  none Past anti-emetic:  Zofran Past antihypertensive medications:  none Past antidepressant medications:  sertraline 50mg, 1mriptyline 745mg as51mded for sleep (cannot take nightly due to extreme dry mouth), Lexapro  71m Past anticonvulsant medications:  topiramate immediate release  1046m(cognitive problems), Topiramate ER 100 mg (effective) Past anti-CGRP:  none Past vitamins/Herbal/Supplements:  none Other past therapies:  none   Family history of headache:  cousins   MRI and MRA of head from 03/15/15 were personally reviewed and were unremarkable except for minimal punctate white matter foci.   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  CT cervical spine from 01/09/18 showed cervical spondylosis with degenerative changes greatest at C4-C7 levels with bilateral neural foraminal stenosis, as well as mild multifactorial C5-6 canal stenosis.  She is treated by pain management.  PAST MEDICAL HISTORY: Past Medical History:  Diagnosis Date   Allergy    Anemia    history of   Anxiety    Asthma    Depression    Diabetes mellitus    type 2    Fibromyalgia    Gastric ulcer    GERD (gastroesophageal reflux disease)    Grave's disease    Graves disease    H/O therapeutic radiation    Migraine    Multiple thyroid nodules    Myofacial muscle pain    Osteoarthritis    Facet hypertrophy with injections   TIA (transient ischemic attack)    02/2015    MEDICATIONS: Current Outpatient Medications on File Prior to Visit  Medication Sig Dispense Refill   albuterol (PROAIR HFA) 108 (90 Base) MCG/ACT inhaler Inhale 2 puffs into the lungs every 4 (four) hours as needed for wheezing. 1 Inhaler 2   albuterol (PROVENTIL) (2.5 MG/3ML) 0.083% nebulizer solution Take 3 mLs by nebulization 4 (four) times daily as needed for wheezing or shortness of breath.   3   amLODipine (NORVASC) 2.5 MG tablet Take 2.5 mg by mouth daily.     aspirin EC 81 MG tablet Take 81 mg by mouth daily. Swallow whole.     baclofen (LIORESAL) 10 MG tablet TAKE 1 TABLET BY MOUTH TWICE DAILY. CAUTION FOR DROWSINESS 60 tablet 0   BD PEN NEEDLE NANO 2ND GEN 32G X 4 MM MISC USE TO INJECT LANTUS UNDER THE SKIN EVERY MORNING 100 each 3   benzonatate (TESSALON) 200 MG  capsule Take 200 mg by mouth 3 (three) times daily as needed.     Blood Glucose Calibration (ONETOUCH VERIO) SOLN Used to check meter accuracy (Patient taking differently: 1 each by Other route as needed. Use to calibrate meter prn) 1 each 0   Blood Glucose Monitoring Suppl (ONETOUCH VERIO) w/Device KIT 1 each by Other route 2 (two) times daily.      clotrimazole (MYCELEX) 10 MG troche Take 1 tablet (10 mg total) by mouth 5 (five) times daily. 35 Troche 0   DULoxetine (CYMBALTA) 60 MG capsule Take 60 mg by mouth 2 (two) times daily.     EMGALITY 120 MG/ML SOAJ ADMINISTER 1 ML UNDER THE SKIN EVERY 30 DAYS 1 mL 1   famciclovir (FAMVIR) 500 MG tablet Take 500 mg by mouth 2 (two) times daily.     glucose blood (ONETOUCH VERIO) test strip Check your blood sugar once a day.  vary the time of day when you check, between before the 3 meals, and at bedtime 200 strip 0   hydrOXYzine (VISTARIL) 25 MG capsule Take 25 mg by mouth 3 (three) times daily as needed for itching.     insulin glargine, 2 Unit Dial, (TOUJEO MAX SOLOSTAR) 300 UNIT/ML Solostar Pen Inject 90 Units into the skin every  morning. 30 mL 3   lamoTRIgine (LAMICTAL) 100 MG tablet Take 100 mg by mouth daily.     Lancets (ONETOUCH DELICA PLUS [id number]) MISC USE TO TEST BLOOD GLUCOSE ONCE DAILY, ALTERNATING TESTING TIMES 500 each 0   levothyroxine (SYNTHROID) 200 MCG tablet Take 1 tablet (200 mcg total) by mouth daily. 90 tablet 3   lidocaine (LIDODERM) 5 % UNWRAP AND APPLY 1 PATCH TO SKIN DAILY FOR 15 DAYS. REMOVE AND DISCARD PATCH WITHIN 12 HOURS OR AS DIRECTED (Patient taking differently: Place 1 patch onto the skin daily as needed (pain).) 15 patch 0   linaclotide (LINZESS) 290 MCG CAPS capsule Take 290 mcg by mouth daily before breakfast.      Magnesium 250 MG TABS Take 750 mg by mouth at bedtime.     mirtazapine (REMERON) 7.5 MG tablet Take 7.5 mg by mouth at bedtime.     montelukast (SINGULAIR) 10 MG tablet Take 10 mg by mouth daily.   3    omeprazole (PRILOSEC) 40 MG capsule Take 40 mg by mouth 2 (two) times daily.      pravastatin (PRAVACHOL) 20 MG tablet Take 20 mg by mouth daily.     pregabalin (LYRICA) 50 MG capsule Take 100 mg by mouth daily.     promethazine (PHENERGAN) 12.5 MG tablet Take 12.5 mg by mouth every 6 (six) hours as needed for nausea or vomiting.     rizatriptan (MAXALT-MLT) 10 MG disintegrating tablet DISSOLVE 1 TABLET ON THE TONGUE AT ONSET OF HEADACHE. MAY REPEAT 1 TIME IN 2 HOURS AS NEEDED 9 tablet 3   triamcinolone (KENALOG) 0.1 % Apply 1 application topically 2 (two) times daily as needed (itching).     zonisamide (ZONEGRAN) 50 MG capsule Take 1 capsule (50 mg total) by mouth daily. (Patient not taking: Reported on 02/14/2021) 30 capsule 5   No current facility-administered medications on file prior to visit.    ALLERGIES: Allergies  Allergen Reactions   Eggs Or Egg-Derived Products Other (See Comments)    Don't eat eggs   Ergotrate [Ergonovine] Other (See Comments)    Pt does not ever want   Ibuprofen Other (See Comments)    Patient has ulcer   Latex Other (See Comments)    Breaks out   Metformin And Related Other (See Comments)    Dizziness   Yutopar [Ritodrine] Other (See Comments)    Pt does not ever want    FAMILY HISTORY: Family History  Problem Relation Age of Onset   Hypertension Mother    Diabetes Mother    Cancer Mother        rectal   Heart disease Father 60       MI x5   Hypertension Father    Diabetes Father    Drug abuse Brother    Alcohol abuse Brother    Breast cancer Sister        unsure of age , was between 19 and 38   Drug abuse Brother    Alcohol abuse Brother    Anxiety disorder Neg Hx    Bipolar disorder Neg Hx    Depression Neg Hx       Objective:  *** General: No acute distress.  Patient appears ***-groomed.   Head:  Normocephalic/atraumatic Eyes:  Fundi examined but not visualized Neck: supple, no paraspinal tenderness, full range of motion Heart:   Regular rate and rhythm Lungs:  Clear to auscultation bilaterally Back: No paraspinal tenderness Neurological Exam: alert and oriented to person,  place, and time.  Speech fluent and not dysarthric, language intact.  CN II-XII intact. Bulk and tone normal, muscle strength 5/5 throughout.  Sensation to light touch intact.  Deep tendon reflexes 2+ throughout, toes downgoing.  Finger to nose testing intact.  Gait normal, Romberg negative.   Metta Clines, DO  CC: ***

## 2021-03-08 ENCOUNTER — Ambulatory Visit: Payer: 59 | Admitting: Neurology

## 2021-03-26 ENCOUNTER — Other Ambulatory Visit: Payer: Self-pay

## 2021-03-26 ENCOUNTER — Ambulatory Visit (INDEPENDENT_AMBULATORY_CARE_PROVIDER_SITE_OTHER): Payer: 59 | Admitting: Endocrinology

## 2021-03-26 VITALS — BP 140/86 | HR 83 | Ht 64.0 in | Wt 217.4 lb

## 2021-03-26 DIAGNOSIS — E039 Hypothyroidism, unspecified: Secondary | ICD-10-CM

## 2021-03-26 LAB — T4, FREE: Free T4: 1 ng/dL (ref 0.60–1.60)

## 2021-03-26 LAB — POCT GLYCOSYLATED HEMOGLOBIN (HGB A1C): Hemoglobin A1C: 9.7 % — AB (ref 4.0–5.6)

## 2021-03-26 LAB — TSH: TSH: 8.2 u[IU]/mL — ABNORMAL HIGH (ref 0.35–5.50)

## 2021-03-26 MED ORDER — LEVOTHYROXINE SODIUM 112 MCG PO TABS: 224.0000 ug | ORAL_TABLET | Freq: Every day | ORAL | 1 refills | Status: DC

## 2021-03-26 MED ORDER — TOUJEO MAX SOLOSTAR 300 UNIT/ML ~~LOC~~ SOPN: 100.0000 [IU] | PEN_INJECTOR | SUBCUTANEOUS | 3 refills | Status: DC

## 2021-03-26 NOTE — Patient Instructions (Signed)
Please increase the insulin to 100 units each morning.  Blood tests are requested for you today.  We'll let you know about the results.  check your blood sugar once a day.  vary the time of day when you check, between before the 3 meals, and at bedtime.  also check if you have symptoms of your blood sugar being too high or too low.  please keep a record of the readings and bring it to your next appointment here (or you can bring the meter itself).  You can write it on any piece of paper.  please call us sooner if your blood sugar goes below 70, or if you have a lot of readings over 200.      Please come back for a follow-up appointment in 2 months.

## 2021-03-26 NOTE — Progress Notes (Signed)
Subjective:    Patient ID: Kendra Hall, female    DOB: 1965/11/28, 55 y.o.   MRN: [id number]  HPI Pt returns for f/u of diabetes mellitus:  DM type: Insulin-requiring type 2 Dx'ed: 1884 Complications: renal insuff.    Therapy: insulin since 2020. GDM: 1984.   DKA: never.   Severe hypoglycemia: once, in 2018.  Pancreatitis: never.  Other: metformin caused dizziness; she also took insulin 2012-2018.  she did not tolerate invokana (vaginitis); renal insuff limits rx options; She stopped trulicity, due to nausea; she declines multiple daily injections.   Interval history: no cbg record, but states cbg's vary from 120-190.  she has not recently missed insulin.  She says she never misses the insulin.    Obesity: she took Victoza/Trulicity 1660-6301.  Pt says she took saxenda in the past.   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  She takes synthroid as rx'ed.  Past Medical History:  Diagnosis Date   Allergy    Anemia    history of   Anxiety    Asthma    Depression    Diabetes mellitus    type 2    Fibromyalgia    Gastric ulcer    GERD (gastroesophageal reflux disease)    Grave's disease    Graves disease    H/O therapeutic radiation    Migraine    Multiple thyroid nodules    Myofacial muscle pain    Osteoarthritis    Facet hypertrophy with injections   TIA (transient ischemic attack)    02/2015    Past Surgical History:  Procedure Laterality Date   AXILLARY LYMPH NODE DISSECTION  2001   CHOLECYSTECTOMY N/A 09/03/2018   Procedure: LAPAROSCOPIC CHOLECYSTECTOMY;  Surgeon: Coralie Keens, MD;  Location: [facility];  Service: General;  Laterality: N/A;   [place] Left 03/11/2017   Procedure: HYSTERECTOMY VAGINAL W/LEFT PROXIMAL SALPINGECTOMY;  Surgeon: Princess Bruins, MD;  Location: [facility];  Service: Gynecology;  Laterality: Left;  request 7:30am OR  time  request 2 hours      Social History   Socioeconomic History   Marital status: Single    Spouse name: Not on file   Number of children: 4   Years of education: 14   Highest education level: Not on file  Occupational History   Occupation: customer service    Employer: [employer]  Tobacco Use   Smoking status: Every Day    Packs/day: 0.50    Years: 30.00    Pack years: 15.00    Types: Cigarettes    Last attempt to quit: 04/24/2018    Years since quitting: 2.9   Smokeless tobacco: Never  Vaping Use   Vaping Use: Never used  Substance and Sexual Activity   Alcohol use: No    Comment: rare   Drug use: No    Comment: 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  Today denies all drug abuse   Sexual activity: Not Currently    Birth control/protection: Surgical  Other Topics Concern   Not on file  Social History Narrative   Lives alone in [place]. Her daugther occassoinally comes and stay with her. 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  originally from [place],  [place].  Raised by mom and her sister who is 8 yrs older than pt. Pt has several half siblings. No longer on disability. Pt has 4 kids, never married.          Social Determinants of Health   Financial Resource Strain: Not on file  Food Insecurity: Not on file  Transportation Needs: Not on file  Physical Activity: Not on file  Stress: Not on file  Social Connections: Not on file  Intimate Partner Violence: Not on file    Current Outpatient Medications on File Prior to Visit  Medication Sig Dispense Refill   albuterol (PROAIR HFA) 108 (90 Base) MCG/ACT inhaler Inhale 2 puffs into the lungs every 4 (four) hours as needed for wheezing. 1 Inhaler 2   albuterol (PROVENTIL) (2.5 MG/3ML) 0.083% nebulizer solution Take 3 mLs by nebulization 4 (four) times daily as needed for wheezing  or shortness of breath.   3   amLODipine (NORVASC) 2.5 MG tablet Take 2.5 mg by mouth daily.     aspirin EC 81 MG tablet Take 81 mg by mouth daily. Swallow whole.     baclofen (LIORESAL) 10 MG tablet TAKE 1 TABLET BY MOUTH TWICE DAILY. CAUTION FOR DROWSINESS 60 tablet 0   BD PEN NEEDLE NANO 2ND GEN 32G X 4 MM MISC USE TO INJECT LANTUS UNDER THE SKIN EVERY MORNING 100 each 3   benzonatate (TESSALON) 200 MG capsule Take 200 mg by mouth 3 (three) times daily as needed.     Blood Glucose Calibration (ONETOUCH VERIO) SOLN Used to check meter accuracy (Patient taking differently: 1 each by Other route as needed. Use to calibrate meter prn) 1 each 0   Blood Glucose Monitoring Suppl (ONETOUCH VERIO) w/Device KIT 1 each by Other route 2 (two) times daily.      clotrimazole (MYCELEX) 10 MG troche Take 1 tablet (10 mg total) by mouth 5 (five) times daily. 35 Troche 0   DULoxetine (CYMBALTA) 60 MG capsule Take 60 mg by mouth 2 (two) times daily.     EMGALITY 120 MG/ML SOAJ ADMINISTER 1 ML UNDER THE SKIN EVERY 30 DAYS 1 mL 1   famciclovir (FAMVIR) 500 MG tablet Take 500 mg by mouth 2 (two) times daily.     glucose blood (ONETOUCH VERIO) test strip Check your blood sugar once a day.  vary the time of day when you check, between before the 3 meals, and at bedtime 200 strip 0   hydrOXYzine (VISTARIL) 25 MG capsule Take 25 mg by mouth 3 (three) times daily as needed for itching.     lamoTRIgine (LAMICTAL) 100 MG tablet Take 100 mg by mouth daily.     Lancets (ONETOUCH DELICA PLUS [id number]) MISC USE TO TEST BLOOD GLUCOSE ONCE DAILY, ALTERNATING TESTING TIMES 500 each 0   lidocaine (LIDODERM) 5 % UNWRAP AND APPLY 1 PATCH TO SKIN DAILY FOR 15 DAYS. REMOVE AND DISCARD PATCH WITHIN 12 HOURS OR AS DIRECTED (Patient taking differently: Place 1 patch onto the skin daily as needed (pain).) 15 patch 0   linaclotide (LINZESS) 290 MCG CAPS capsule Take 290 mcg by mouth daily before breakfast.      Magnesium 250 MG TABS Take  750 mg by mouth at bedtime.     mirtazapine (REMERON) 7.5 MG tablet Take 7.5 mg by mouth at bedtime.     montelukast (SINGULAIR) 10 MG tablet Take 10 mg by mouth daily.   3   omeprazole (PRILOSEC) 40 MG capsule Take 40 mg by mouth 2 (  two) times daily.      pravastatin (PRAVACHOL) 20 MG tablet Take 20 mg by mouth daily.     pregabalin (LYRICA) 50 MG capsule Take 100 mg by mouth daily.     promethazine (PHENERGAN) 12.5 MG tablet Take 12.5 mg by mouth every 6 (six) hours as needed for nausea or vomiting.     rizatriptan (MAXALT-MLT) 10 MG disintegrating tablet DISSOLVE 1 TABLET ON THE TONGUE AT ONSET OF HEADACHE. MAY REPEAT 1 TIME IN 2 HOURS AS NEEDED 9 tablet 3   triamcinolone (KENALOG) 0.1 % Apply 1 application topically 2 (two) times daily as needed (itching).     zonisamide (ZONEGRAN) 50 MG capsule Take 1 capsule (50 mg total) by mouth daily. 30 capsule 5   No current facility-administered medications on file prior to visit.    Allergies  Allergen Reactions   Eggs Or Egg-Derived Products Other (See Comments)    Don't eat eggs   Ergotrate [Ergonovine] Other (See Comments)    Pt does not ever want   Ibuprofen Other (See Comments)    Patient has ulcer   Latex Other (See Comments)    Breaks out   Metformin And Related Other (See Comments)    Dizziness   Yutopar [Ritodrine] Other (See Comments)    Pt does not ever want    Family History  Problem Relation Age of Onset   Hypertension Mother    Diabetes Mother    Cancer Mother        rectal   Heart disease Father 66       MI x5   Hypertension Father    Diabetes Father    Drug abuse Brother    Alcohol abuse Brother    Breast cancer Sister        unsure of age , was between 75 and 6   Drug abuse Brother    Alcohol abuse Brother    Anxiety disorder Neg Hx    Bipolar disorder Neg Hx    Depression Neg Hx     BP 140/86 (BP Location: Right Arm, Patient Position: Sitting, Cuff Size: Normal)   Pulse 83   Ht _0  (1.626 m)   Wt  217 lb 6.4 oz (98.6 kg)   LMP 12/06/2016 (Approximate)   SpO2 95%   BMI 37.32 kg/m    Review of Systems She denies hypoglycemia.     Objective:   Physical Exam Pulses: dorsalis pedis intact bilat.   MSK: no deformity of the feet CV: no leg edema Skin:  no ulcer on the feet.  normal color and temp on the feet. Neuro: sensation is intact to touch on the feet.  A1c=9.7% Lab Results  Component Value Date   TSH 8.20 (H) 03/26/2021   T3TOTAL 113.37 03/27/2019       Assessment & Plan:  Insulin-requiring type 2 DM: uncontrolled Hypothyroidism: uncontrolled.  I have sent a prescription to your pharmacy, to increase synthroid.    Patient Instructions  Please increase the insulin to 100 units each morning.  Blood tests are requested for you today.  We'll let you know about the results.  check your blood sugar once a day.  vary the time of day when you check, between before the 3 meals, and at bedtime.  also check if you have symptoms of your blood sugar being too high or too low.  please keep a record of the readings and bring it to your next appointment here (or you can bring the meter  itself).  You can write it on any piece of paper.  please call us sooner if your blood sugar goes below 70, or if you have a lot of readings over 200.      Please come back for a follow-up appointment in 2 months.

## 2021-03-30 ENCOUNTER — Other Ambulatory Visit: Payer: Self-pay | Admitting: Endocrinology

## 2021-04-05 ENCOUNTER — Telehealth: Payer: Self-pay | Admitting: Endocrinology

## 2021-04-05 DIAGNOSIS — E119 Type 2 diabetes mellitus without complications: Secondary | ICD-10-CM

## 2021-04-05 MED ORDER — ONETOUCH VERIO VI STRP: ORAL_STRIP | 1 refills | Status: DC

## 2021-04-05 NOTE — Telephone Encounter (Signed)
MEDICATION: One Touch Verio Strips  PHARMACY:  [facility] on [address] CONTACTED THEIR PHARMACY?  yes  IS THIS A 90 DAY SUPPLY : unknown  IS PATIENT OUT OF MEDICATION: no  IF NOT; HOW MUCH IS LEFT: unknown - has no refills left on current RX  LAST APPOINTMENT DATE: @10 /12/2020  NEXT APPOINTMENT DATE:@12 /12/2020  DO WE HAVE YOUR PERMISSION TO LEAVE A DETAILED MESSAGE?: yes   OTHER COMMENTS:    **Let patient know to contact pharmacy at the end of the day to make sure medication is ready. **  ** Please notify patient to allow 48-72 hours to process**  **Encourage patient to contact the pharmacy for refills or they can request refills through [facility]**

## 2021-04-05 NOTE — Telephone Encounter (Signed)
Script sent  

## 2021-05-28 ENCOUNTER — Other Ambulatory Visit: Payer: Self-pay | Admitting: Endocrinology

## 2021-05-28 DIAGNOSIS — Z794 Long term (current) use of insulin: Secondary | ICD-10-CM

## 2021-05-30 ENCOUNTER — Ambulatory Visit: Payer: 59 | Admitting: Endocrinology

## 2021-05-30 ENCOUNTER — Other Ambulatory Visit: Payer: Self-pay | Admitting: Neurology

## 2021-05-31 ENCOUNTER — Ambulatory Visit: Payer: 59 | Admitting: Family Medicine

## 2021-06-01 ENCOUNTER — Telehealth: Payer: Self-pay

## 2021-06-01 NOTE — Telephone Encounter (Signed)
New message   Sundra Aland (Key: [id number]) Emgality 120MG /ML auto-injectors (migraine)   Form [facility] Electronic Prior Authorization Form (2017 NCP[employer]) Created 6 days ago Sent to Plan 4 minutes ago Plan Response 3 minutes ago Submit Clinical Questions less than a minute ago Determination Wait for Determination Please wait for [facility] 2017 NCP[employer] to return a determination.     Your information has been sent to [facility].

## 2021-07-04 ENCOUNTER — Ambulatory Visit: Payer: 59 | Admitting: Endocrinology

## 2021-07-06 ENCOUNTER — Other Ambulatory Visit: Payer: Self-pay

## 2021-07-06 DIAGNOSIS — Z794 Long term (current) use of insulin: Secondary | ICD-10-CM

## 2021-07-06 DIAGNOSIS — E119 Type 2 diabetes mellitus without complications: Secondary | ICD-10-CM

## 2021-07-06 MED ORDER — ONETOUCH VERIO VI STRP
ORAL_STRIP | 1 refills | Status: DC
Start: 2021-07-06 — End: 2021-10-31

## 2021-07-06 MED ORDER — ONETOUCH DELICA PLUS LANCET33G MISC: 0 refills | Status: DC

## 2021-07-26 ENCOUNTER — Other Ambulatory Visit: Payer: Self-pay

## 2021-07-26 DIAGNOSIS — E119 Type 2 diabetes mellitus without complications: Secondary | ICD-10-CM

## 2021-07-26 DIAGNOSIS — Z794 Long term (current) use of insulin: Secondary | ICD-10-CM

## 2021-07-26 MED ORDER — ONETOUCH DELICA PLUS LANCET33G MISC: 0 refills | Status: DC

## 2021-07-27 ENCOUNTER — Ambulatory Visit: Payer: 59 | Admitting: Endocrinology

## 2021-08-09 ENCOUNTER — Ambulatory Visit (INDEPENDENT_AMBULATORY_CARE_PROVIDER_SITE_OTHER): Payer: 59 | Admitting: Endocrinology

## 2021-08-09 ENCOUNTER — Other Ambulatory Visit: Payer: Self-pay

## 2021-08-09 VITALS — BP 140/70 | HR 98 | Ht 64.0 in | Wt 210.0 lb

## 2021-08-09 DIAGNOSIS — E890 Postprocedural hypothyroidism: Secondary | ICD-10-CM

## 2021-08-09 DIAGNOSIS — E119 Type 2 diabetes mellitus without complications: Secondary | ICD-10-CM

## 2021-08-09 DIAGNOSIS — Z794 Long term (current) use of insulin: Secondary | ICD-10-CM

## 2021-08-09 LAB — POCT GLYCOSYLATED HEMOGLOBIN (HGB A1C): Hemoglobin A1C: 12.2 % — AB (ref 4.0–5.6)

## 2021-08-09 MED ORDER — TOUJEO MAX SOLOSTAR 300 UNIT/ML ~~LOC~~ SOPN: 110.0000 [IU] | PEN_INJECTOR | SUBCUTANEOUS | 3 refills | Status: DC

## 2021-08-09 NOTE — Patient Instructions (Addendum)
I have sent a prescription to your pharmacy, to increase the insulin to 110 units each morning.   Blood tests are requested for you today.  We'll let you know about the results.   check your blood sugar once a day.  vary the time of day when you check, between before the 3 meals, and at bedtime.  also check if you have symptoms of your blood sugar being too high or too low.  please keep a record of the readings and bring it to your next appointment here (or you can bring the meter itself).  You can write it on any piece of paper.  please call us sooner if your blood sugar goes below 70, or if you have a lot of readings over 200.   Please come back for a follow-up appointment in 2 months.

## 2021-08-09 NOTE — Progress Notes (Signed)
Subjective:    Patient ID: Kendra Hall, female    DOB: [birth year], 56 y.o.   MRN: [id number]  HPI Pt returns for f/u of diabetes mellitus:  DM type: Insulin-requiring type 2 Dx'ed: 7619 Complications: renal insuff.    Therapy: insulin since 2020. GDM: 1984.   DKA: never.   Severe hypoglycemia: once, in 2018.  Pancreatitis: never.  Other: metformin caused dizziness; she also took insulin 2012-2018.  she did not tolerate invokana (vaginitis) or trulicity (nausea); she declines multiple daily injections.   Interval history: no cbg record, but states cbg's are high.  She says she sometimes misses the insulin, due to cost.  However, she now gets for free at work.   Obesity: she took Victoza/Trulicity 5093-2671.  Pt says she took saxenda in the past.  He goes to a gym.   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  She takes synthroid as rx'ed.   Past Medical History:  Diagnosis Date   Allergy    Anemia    history of   Anxiety    Asthma    Depression    Diabetes mellitus    type 2    Fibromyalgia    Gastric ulcer    GERD (gastroesophageal reflux disease)    Grave's disease    Graves disease    H/O therapeutic radiation    Migraine    Multiple thyroid nodules    Myofacial muscle pain    Osteoarthritis    Facet hypertrophy with injections   TIA (transient ischemic attack)    02/2015    Past Surgical History:  Procedure Laterality Date   AXILLARY LYMPH NODE DISSECTION  2001   CHOLECYSTECTOMY N/A 09/03/2018   Procedure: LAPAROSCOPIC CHOLECYSTECTOMY;  Surgeon: Coralie Keens, MD;  Location: [facility];  Service: General;  Laterality: N/A;   [place] Left 03/11/2017   Procedure: HYSTERECTOMY VAGINAL W/LEFT PROXIMAL SALPINGECTOMY;  Surgeon: Princess Bruins, MD;  Location: [facility];  Service: Gynecology;  Laterality: Left;  request 7:30am OR time  request 2 hours       Social History   Socioeconomic History   Marital status: Single    Spouse name: Not on file   Number of children: 4   Years of education: 14   Highest education level: Not on file  Occupational History   Occupation: customer service    Employer: [employer]  Tobacco Use   Smoking status: Every Day    Packs/day: 0.50    Years: 30.00    Pack years: 15.00    Types: Cigarettes    Last attempt to quit: 04/24/2018    Years since quitting: 3.3   Smokeless tobacco: Never  Vaping Use   Vaping Use: Never used  Substance and Sexual Activity   Alcohol use: No    Comment: rare   Drug use: No    Comment: 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  Today denies all drug abuse   Sexual activity: Not Currently    Birth control/protection: Surgical  Other Topics Concern   Not on file  Social History Narrative   Lives alone in [place]. Her daugther occassoinally comes and stay with her. 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  originally from [place],  [place].  Raised by mom and her sister who is 8 yrs older than pt. Pt has several half siblings. No longer on disability. Pt has 4 kids, never married.          Social Determinants of Health   Financial Resource Strain: Not on file  Food Insecurity: Not on file  Transportation Needs: Not on file  Physical Activity: Not on file  Stress: Not on file  Social Connections: Not on file  Intimate Partner Violence: Not on file    Current Outpatient Medications on File Prior to Visit  Medication Sig Dispense Refill   albuterol (PROAIR HFA) 108 (90 Base) MCG/ACT inhaler Inhale 2 puffs into the lungs every 4 (four) hours as needed for wheezing. 1 Inhaler 2   albuterol (PROVENTIL) (2.5 MG/3ML) 0.083% nebulizer solution Take 3 mLs by nebulization 4 (four) times daily as needed for wheezing or shortness of breath.   3    amLODipine (NORVASC) 2.5 MG tablet Take 2.5 mg by mouth daily.     aspirin EC 81 MG tablet Take 81 mg by mouth daily. Swallow whole.     baclofen (LIORESAL) 10 MG tablet TAKE 1 TABLET BY MOUTH TWICE DAILY. CAUTION FOR DROWSINESS 60 tablet 0   BD PEN NEEDLE NANO 2ND GEN 32G X 4 MM MISC USE TO INJECT LANTUS UNDER THE SKIN EVERY MORNING 100 each 3   benzonatate (TESSALON) 200 MG capsule Take 200 mg by mouth 3 (three) times daily as needed.     Blood Glucose Calibration (ONETOUCH VERIO) SOLN Used to check meter accuracy (Patient taking differently: 1 each by Other route as needed. Use to calibrate meter prn) 1 each 0   Blood Glucose Monitoring Suppl (ONETOUCH VERIO) w/Device KIT 1 each by Other route 2 (two) times daily.      clotrimazole (MYCELEX) 10 MG troche Take 1 tablet (10 mg total) by mouth 5 (five) times daily. 35 Troche 0   DULoxetine (CYMBALTA) 60 MG capsule Take 60 mg by mouth 2 (two) times daily.     famciclovir (FAMVIR) 500 MG tablet Take 500 mg by mouth 2 (two) times daily.     glucose blood (ONETOUCH VERIO) test strip CHECK YOUR BLOOD SUGAR ONCE DAILY(VARY THE TIME OF DAY WHEN YOU CHECK BETWEEN, BEFORE THE 3 MEALS, AND AT BEDTIME) 200 strip 1   hydrOXYzine (VISTARIL) 25 MG capsule Take 25 mg by mouth 3 (three) times daily as needed for itching.     lamoTRIgine (LAMICTAL) 100 MG tablet Take 100 mg by mouth daily.     Lancets (ONETOUCH DELICA PLUS [id number]) MISC USE TO TEST BLOOD GLUCOSE ONCE DAILY, ALTERNATING TESTING TIMES 500 each 0   levothyroxine (SYNTHROID) 112 MCG tablet Take 2 tablets (224 mcg total) by mouth daily. 180 tablet 1   lidocaine (LIDODERM) 5 % UNWRAP AND APPLY 1 PATCH TO SKIN DAILY FOR 15 DAYS. REMOVE AND DISCARD PATCH WITHIN 12 HOURS OR AS DIRECTED (Patient taking differently: Place 1 patch onto the skin daily as needed (pain).) 15 patch 0   linaclotide (LINZESS) 290 MCG CAPS capsule Take 290 mcg by mouth daily before breakfast.      Magnesium 250 MG TABS Take 750  mg by mouth at bedtime.     mirtazapine (REMERON) 7.5 MG tablet Take 7.5 mg by mouth at bedtime.     montelukast (SINGULAIR) 10 MG tablet Take 10 mg by mouth daily.   3   omeprazole (PRILOSEC) 40 MG capsule Take 40 mg by mouth 2 (  two) times daily.      pravastatin (PRAVACHOL) 20 MG tablet Take 20 mg by mouth daily.     pregabalin (LYRICA) 50 MG capsule Take 100 mg by mouth daily.     promethazine (PHENERGAN) 12.5 MG tablet Take 12.5 mg by mouth every 6 (six) hours as needed for nausea or vomiting.     rizatriptan (MAXALT-MLT) 10 MG disintegrating tablet DISSOLVE 1 TABLET ON THE TONGUE AT ONSET OF HEADACHE. MAY REPEAT 1 TIME IN 2 HOURS AS NEEDED 9 tablet 3   triamcinolone (KENALOG) 0.1 % Apply 1 application topically 2 (two) times daily as needed (itching).     zonisamide (ZONEGRAN) 50 MG capsule Take 1 capsule (50 mg total) by mouth daily. 30 capsule 5   No current facility-administered medications on file prior to visit.    Allergies  Allergen Reactions   Eggs Or Egg-Derived Products Other (See Comments)    Don't eat eggs   Ergotrate [Ergonovine] Other (See Comments)    Pt does not ever want   Ibuprofen Other (See Comments)    Patient has ulcer   Latex Other (See Comments)    Breaks out   Metformin And Related Other (See Comments)    Dizziness   Yutopar [Ritodrine] Other (See Comments)    Pt does not ever want    Family History  Problem Relation Age of Onset   Hypertension Mother    Diabetes Mother    Cancer Mother        rectal   Heart disease Father 28       MI x5   Hypertension Father    Diabetes Father    Drug abuse Brother    Alcohol abuse Brother    Breast cancer Sister        unsure of age , was between 37 and 52   Drug abuse Brother    Alcohol abuse Brother    Anxiety disorder Neg Hx    Bipolar disorder Neg Hx    Depression Neg Hx     BP 140/70    Pulse 98    Ht '5\' 4"'  (1.626 m)    Wt 210 lb (95.3 kg)    LMP 12/06/2016 (Approximate)    SpO2 99%    BMI 36.05  kg/m    Review of Systems     Objective:   Physical Exam     Lab Results  Component Value Date   HGBA1C 12.2 (A) 08/09/2021      Assessment & Plan:  Insulin-requiring type 2 DM: uncontrolled Obesity: uncontrolled.  I did bariatric surg letter  Patient Instructions  I have sent a prescription to your pharmacy, to increase the insulin to 110 units each morning.   Blood tests are requested for you today.  We'll let you know about the results.   check your blood sugar once a day.  vary the time of day when you check, between before the 3 meals, and at bedtime.  also check if you have symptoms of your blood sugar being too high or too low.  please keep a record of the readings and bring it to your next appointment here (or you can bring the meter itself).  You can write it on any piece of paper.  please call us sooner if your blood sugar goes below 70, or if you have a lot of readings over 200.   Please come back for a follow-up appointment in 2 months.

## 2021-08-10 ENCOUNTER — Other Ambulatory Visit: Payer: Self-pay | Admitting: Neurology

## 2021-08-10 ENCOUNTER — Other Ambulatory Visit: Payer: 59

## 2021-08-30 ENCOUNTER — Other Ambulatory Visit: Payer: Self-pay

## 2021-08-30 ENCOUNTER — Other Ambulatory Visit (INDEPENDENT_AMBULATORY_CARE_PROVIDER_SITE_OTHER): Payer: 59

## 2021-08-30 DIAGNOSIS — E119 Type 2 diabetes mellitus without complications: Secondary | ICD-10-CM

## 2021-08-30 DIAGNOSIS — E890 Postprocedural hypothyroidism: Secondary | ICD-10-CM | POA: Diagnosis not present

## 2021-08-30 DIAGNOSIS — Z794 Long term (current) use of insulin: Secondary | ICD-10-CM | POA: Diagnosis not present

## 2021-08-30 LAB — BASIC METABOLIC PANEL
BUN: 14 mg/dL (ref 6–23)
CO2: 29 mEq/L (ref 19–32)
Calcium: 9.3 mg/dL (ref 8.4–10.5)
Chloride: 103 mEq/L (ref 96–112)
Creatinine, Ser: 0.82 mg/dL (ref 0.40–1.20)
GFR: 80.3 mL/min (ref >60.00)
Glucose, Bld: 143 mg/dL — ABNORMAL HIGH (ref 70–99)
Potassium: 4.2 mEq/L (ref 3.5–5.1)
Sodium: 140 mEq/L (ref 135–145)

## 2021-08-30 LAB — TSH: TSH: 0.13 u[IU]/mL — ABNORMAL LOW (ref 0.35–5.50)

## 2021-08-30 LAB — T4, FREE: Free T4: 0.82 ng/dL (ref 0.60–1.60)

## 2021-10-03 ENCOUNTER — Other Ambulatory Visit: Payer: Self-pay

## 2021-10-03 DIAGNOSIS — E119 Type 2 diabetes mellitus without complications: Secondary | ICD-10-CM

## 2021-10-03 MED ORDER — LEVOTHYROXINE SODIUM 112 MCG PO TABS: 224.0000 ug | ORAL_TABLET | Freq: Every day | ORAL | 1 refills | Status: DC

## 2021-10-09 LAB — HM DIABETES EYE EXAM

## 2021-10-11 ENCOUNTER — Other Ambulatory Visit: Payer: Self-pay | Admitting: Neurology

## 2021-10-15 ENCOUNTER — Encounter: Payer: Self-pay | Admitting: Endocrinology

## 2021-10-18 ENCOUNTER — Ambulatory Visit (INDEPENDENT_AMBULATORY_CARE_PROVIDER_SITE_OTHER): Payer: 59 | Admitting: Endocrinology

## 2021-10-18 ENCOUNTER — Telehealth: Payer: Self-pay

## 2021-10-18 VITALS — BP 144/82 | HR 87 | Ht 64.0 in | Wt 214.2 lb

## 2021-10-18 DIAGNOSIS — Z794 Long term (current) use of insulin: Secondary | ICD-10-CM

## 2021-10-18 DIAGNOSIS — E890 Postprocedural hypothyroidism: Secondary | ICD-10-CM | POA: Diagnosis not present

## 2021-10-18 DIAGNOSIS — E119 Type 2 diabetes mellitus without complications: Secondary | ICD-10-CM

## 2021-10-18 LAB — POCT GLYCOSYLATED HEMOGLOBIN (HGB A1C): Hemoglobin A1C: 11 % — AB (ref 4.0–5.6)

## 2021-10-18 LAB — POCT GLUCOSE (DEVICE FOR HOME USE): POC Glucose: 145 mg/dl — AB (ref 70–99)

## 2021-10-18 MED ORDER — TOUJEO MAX SOLOSTAR 300 UNIT/ML ~~LOC~~ SOPN: 110.0000 [IU] | PEN_INJECTOR | SUBCUTANEOUS | 3 refills | Status: DC

## 2021-10-18 NOTE — Progress Notes (Signed)
? ?Subjective:  ? ? Patient ID: Kendra Hall, female    DOB: 1965/10/11, 56 y.o.   MRN: [id number] ? 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    ?Therapy: insulin since 2020. ?GDM: 1984.   ?DKA: never.   ?Severe hypoglycemia: once, in 2018.  ?Pancreatitis: never.  ?Other: metformin caused dizziness; she also took insulin 2012-2018.  she did not tolerate Invokana (vaginitis) or Trulicity (nausea); she declines multiple daily injections.   ?Interval history: no cbg record, but states cbg's vary from 75-350.  There is no trend throughout the day.  She says she never misses the insulin.   ?Obesity: she took Victoza/Trulicity 3244-0102.  Pt says she took saxenda in the past.  she goes to a gym.   ?She also has post-RAI hypothyroidism (she had RAI for hyperthyroidism (Grave's Dz), in 2012).  She takes synthroid as rx'ed.   ?Past Medical History:  ?Diagnosis Date  ? Allergy   ? Anemia   ? history of  ? Anxiety   ? Asthma   ? Depression   ? Diabetes mellitus   ? type 2   ? Fibromyalgia   ? Gastric ulcer   ? GERD (gastroesophageal reflux disease)   ? Grave's disease   ? Graves disease   ? H/O therapeutic radiation   ? Migraine   ? Multiple thyroid nodules   ? Myofacial muscle pain   ? Osteoarthritis   ? Facet hypertrophy with injections  ? TIA (transient ischemic attack)   ? 02/2015  ? ? ?Past Surgical History:  ?Procedure Laterality Date  ? AXILLARY LYMPH NODE DISSECTION  2001  ? CHOLECYSTECTOMY N/A 09/03/2018  ? Procedure: LAPAROSCOPIC CHOLECYSTECTOMY;  Surgeon: Coralie Keens, MD;  Location: [facility];  Service: General;  Laterality: N/A;  ? HYDRADENITIS EXCISION    ? TUBAL LIGATION  1995  ? VAGINAL HYSTERECTOMY Left 03/11/2017  ? Procedure: HYSTERECTOMY VAGINAL W/LEFT PROXIMAL SALPINGECTOMY;  Surgeon: Princess Bruins, MD;  Location: [facility];  Service: Gynecology;  Laterality: Left;  request 7:30am OR time ? ?request 2 hours ? ?   ? ? ?Social History  ? ?Socioeconomic History  ? Marital status: Single  ?  Spouse name: Not on file  ? Number of children: 4  ? Years of education: 30  ? Highest education level: Not on file  ?Occupational History  ? Occupation: customer service  ?  Employer: [employer]  ?Tobacco Use  ? Smoking status: Every Day  ?  Packs/day: 0.50  ?  Years: 30.00  ?  Pack years: 15.00  ?  Types: Cigarettes  ?  Last attempt to quit: 04/24/2018  ?  Years since quitting: 3.4  ? Smokeless tobacco: Never  ?Vaping Use  ? Vaping Use: Never used  ?Substance and Sexual Activity  ? Alcohol use: No  ?  Comment: rare  ? Drug use: No  ?  Comment: cocaine use daily for 3 yrs in her 48's. 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  Today denies all drug abuse  ? Sexual activity: Not Currently  ?  Birth control/protection: Surgical  ?Other Topics Concern  ? Not on file  ?Social History Narrative  ? Lives alone in [place]. Her daugther occassoinally comes and stay with her. 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  originally from [place],  [place].  Raised by mom and her sister who is 8 yrs older than pt. Pt has several half siblings. No longer on disability. Pt has 4 kids, never married.   ?   ?   ? ?Social Determinants of Health  ? ?Financial Resource Strain: Not on file  ?Food Insecurity: Not on file  ?Transportation Needs: Not on file  ?Physical Activity: Not on file  ?Stress: Not on file  ?Social Connections: Not on file  ?Intimate Partner Violence: Not on file  ? ? ?Current Outpatient Medications on File Prior to Visit  ?Medication Sig Dispense Refill  ? albuterol (PROAIR HFA) 108 (90 Base) MCG/ACT inhaler Inhale 2 puffs into the lungs every 4 (four) hours as needed for wheezing. 1 Inhaler 2  ? albuterol (PROVENTIL) (2.5 MG/3ML) 0.083% nebulizer solution Take 3 mLs by nebulization 4 (four) times daily as needed for wheezing or shortness of breath.   3   ? amLODipine (NORVASC) 2.5 MG tablet Take 2.5 mg by mouth daily.    ? aspirin EC 81 MG tablet Take 81 mg by mouth daily. Swallow whole.    ? baclofen (LIORESAL) 10 MG tablet TAKE 1 TABLET BY MOUTH TWICE DAILY. CAUTION FOR DROWSINESS 60 tablet 0  ? BD PEN NEEDLE NANO 2ND GEN 32G X 4 MM MISC USE TO INJECT LANTUS UNDER THE SKIN EVERY MORNING 100 each 3  ? benzonatate (TESSALON) 200 MG capsule Take 200 mg by mouth 3 (three) times daily as needed.    ? Blood Glucose Calibration (ONETOUCH VERIO) SOLN Used to check meter accuracy (Patient taking differently: 1 each by Other route as needed. Use to calibrate meter prn) 1 each 0  ? Blood Glucose Monitoring Suppl (ONETOUCH VERIO) w/Device KIT 1 each by Other route 2 (two) times daily.     ? clotrimazole (MYCELEX) 10 MG troche Take 1 tablet (10 mg total) by mouth 5 (five) times daily. 35 Troche 0  ? DULoxetine (CYMBALTA) 60 MG capsule Take 60 mg by mouth 2 (two) times daily.    ? EMGALITY 120 MG/ML SOAJ ADMINISTER 1 ML UNDER THE SKIN EVERY 30 DAYS 1 mL 1  ? famciclovir (FAMVIR) 500 MG tablet Take 500 mg by mouth 2 (two) times daily.    ? glucose blood (ONETOUCH VERIO) test strip CHECK YOUR BLOOD SUGAR ONCE DAILY(VARY THE TIME OF DAY WHEN YOU CHECK BETWEEN, BEFORE THE 3 MEALS, AND AT BEDTIME) 200 strip 1  ? hydrOXYzine (VISTARIL) 25 MG capsule Take 25 mg by mouth 3 (three) times daily as needed for itching.    ? lamoTRIgine (LAMICTAL) 100 MG tablet Take 100 mg by mouth daily.    ? Lancets (ONETOUCH DELICA PLUS [id number]) MISC USE TO TEST BLOOD GLUCOSE ONCE DAILY, ALTERNATING TESTING TIMES 500 each 0  ? levothyroxine (SYNTHROID) 112 MCG tablet Take 2 tablets (224 mcg total) by mouth daily. 180 tablet 1  ? lidocaine (LIDODERM) 5 % UNWRAP AND APPLY 1 PATCH TO SKIN DAILY FOR 15 DAYS. REMOVE AND DISCARD PATCH WITHIN 12 HOURS OR AS DIRECTED (Patient taking differently: Place 1 patch onto the skin daily as needed (pain).) 15 patch 0  ? linaclotide (LINZESS) 290 MCG CAPS capsule Take  290 mcg by mouth daily before breakfast.     ? Magnesium 250 MG TABS Take 750 mg by mouth at bedtime.    ? mirtazapine (REMERON) 7.5 MG tablet Take 7.5 mg by mouth at bedtime.    ? montelukast (SINGULAIR) 10 MG tablet Take 10 mg by mouth daily.  3  ? omeprazole (PRILOSEC) 40 MG capsule Take 40 mg by mouth 2 (two) times daily.     ? pravastatin (PRAVACHOL) 20 MG tablet Take 20 mg by mouth daily.    ? pregabalin (LYRICA) 50 MG capsule Take 100 mg by mouth daily.    ? promethazine (PHENERGAN) 12.5 MG tablet Take 12.5 mg by mouth every 6 (six) hours as needed for nausea or vomiting.    ? rizatriptan (MAXALT-MLT) 10 MG disintegrating tablet DISSOLVE 1 TABLET ON THE TONGUE AT ONSET OF HEADACHE. MAY REPEAT 1 TIME IN 2 HOURS AS NEEDED 9 tablet 3  ? triamcinolone (KENALOG) 0.1 % Apply 1 application topically 2 (two) times daily as needed (itching).    ? zonisamide (ZONEGRAN) 50 MG capsule Take 1 capsule (50 mg total) by mouth daily. 30 capsule 5  ? ?No current facility-administered medications on file prior to visit.  ? ? ?Allergies  ?Allergen Reactions  ? Eggs Or Egg-Derived Products Other (See Comments)  ?  Don't eat eggs  ? Ergotrate [Ergonovine] Other (See Comments)  ?  Pt does not ever want  ? Ibuprofen Other (See Comments)  ?  Patient has ulcer  ? Latex Other (See Comments)  ?  Breaks out  ? Metformin And Related Other (See Comments)  ?  Dizziness  ? Yutopar [Ritodrine] Other (See Comments)  ?  Pt does not ever want  ? ? ?Family History  ?Problem Relation Age of Onset  ? Hypertension Mother   ? Diabetes Mother   ? Cancer Mother   ?     rectal  ? Heart disease Father 62  ?     MI x5  ? Hypertension Father   ? Diabetes Father   ? Drug abuse Brother   ? Alcohol abuse Brother   ? Breast cancer Sister   ?     unsure of age , was between 78 and 11  ? Drug abuse Brother   ? Alcohol abuse Brother   ? Anxiety disorder Neg Hx   ? Bipolar disorder Neg Hx   ? Depression Neg Hx   ? ? ?BP (!) 144/82 (BP Location: Left Arm, Patient  Position: Sitting, Cuff Size: Normal)   Pulse 87   Ht '5\' 4"'  (1.626 m)   Wt 214 lb 3.2 oz (97.2 kg)   LMP 12/06/2016 (Approximate)   SpO2 97%   BMI 36.77 kg/m?  ? ? ?Review of Systems ?She denies hypoglycemia.

## 2021-10-18 NOTE — Telephone Encounter (Signed)
Patient left OV with blood sugar at 145 and called the office now with her sugar at 69. Patient advised to eat to bring her blood sugar up. Provider advised to decreased insulin from 110 units to 100 units. Patient expressed understanding. ?

## 2021-10-18 NOTE — Patient Instructions (Addendum)
Please continue the same insulin for now.    ?Blood tests are requested for you today.  We'll let you know about the results.   ?check your blood sugar once a day.  vary the time of day when you check, between before the 3 meals, and at bedtime.  also check if you have symptoms of your blood sugar being too high or too low.  please keep a record of the readings and bring it to your next appointment here (or you can bring the meter itself).  You can write it on any piece of paper.  please call us sooner if your blood sugar goes below 70, or if you have a lot of readings over 200.   ?You should have an endocrinology follow-up appointment in 3 months.   ?

## 2021-10-19 LAB — TSH: TSH: 0.02 u[IU]/mL — ABNORMAL LOW (ref 0.35–5.50)

## 2021-10-19 LAB — T4, FREE: Free T4: 1.3 ng/dL (ref 0.60–1.60)

## 2021-10-22 ENCOUNTER — Ambulatory Visit (HOSPITAL_COMMUNITY)
Admission: EM | Admit: 2021-10-22 | Discharge: 2021-10-22 | Disposition: A | Discharge status: Home / Self Care | Payer: 59 | Attending: Physician Assistant | Admitting: Physician Assistant

## 2021-10-22 ENCOUNTER — Encounter (HOSPITAL_COMMUNITY): Payer: Self-pay | Admitting: *Deleted

## 2021-10-22 ENCOUNTER — Other Ambulatory Visit: Payer: Self-pay

## 2021-10-22 DIAGNOSIS — M62838 Other muscle spasm: Secondary | ICD-10-CM | POA: Diagnosis not present

## 2021-10-22 LAB — FRUCTOSAMINE: Fructosamine: 286 umol/L — ABNORMAL HIGH (ref 205–285)

## 2021-10-22 MED ORDER — KETOROLAC TROMETHAMINE 30 MG/ML IJ SOLN
30.0000 mg | Freq: Once | INTRAMUSCULAR | Status: AC
  Administered 2021-10-22: 30 mg via INTRAMUSCULAR

## 2021-10-22 MED ORDER — KETOROLAC TROMETHAMINE 30 MG/ML IJ SOLN
INTRAMUSCULAR | Status: AC
  Filled 2021-10-22: qty 1

## 2021-10-22 MED ORDER — CYCLOBENZAPRINE HCL 5 MG PO TABS: 5.0000 mg | ORAL_TABLET | Freq: Three times a day (TID) | ORAL | 0 refills | Status: DC | PRN

## 2021-10-22 NOTE — ED Provider Notes (Signed)
?[facility] ? ? ? ?CSN: [id number] ?Arrival date & time: 10/22/21  1929 ? ? ?  ? ?History   ?Chief Complaint ?Chief Complaint  ?Patient presents with  ? Neck Pain  ? ? ?HPI ?Kendra Hall is a 56 y.o. female.  ? ?Pt complains of cervical muscle soreness and pain that started a few days ago.  Denies injury or trauma. Right side greater than left.  Pain is worse when turning her head to the left.  She denies radiation of pain, numbness, tingling, weakness.  She reports experiencing this in the past and she had to have a shot for the pain to resolve, unsure what type.  She is currently taking nothing for the pain.  ? ? ?Past Medical History:  ?Diagnosis Date  ? Allergy   ? Anemia   ? history of  ? Anxiety   ? Asthma   ? Depression   ? Diabetes mellitus   ? type 2   ? Fibromyalgia   ? Gastric ulcer   ? GERD (gastroesophageal reflux disease)   ? Grave's disease   ? Graves disease   ? H/O therapeutic radiation   ? Migraine   ? Multiple thyroid nodules   ? Myofacial muscle pain   ? Osteoarthritis   ? Facet hypertrophy with injections  ? TIA (transient ischemic attack)   ? 02/2015  ? ? ?Patient Active Problem List  ? Diagnosis Date Noted  ? Severe obstructive sleep apnea-hypopnea syndrome 03/22/2020  ? Sleep-related hypoxia 03/22/2020  ? Intractable episodic cluster headache 02/10/2020  ? Abnormal dreams 02/10/2020  ? Excessive daytime sleepiness 02/10/2020  ? Snoring 02/10/2020  ? Sleep related headaches 02/10/2020  ? Lt facial numbness 09/25/2017  ? Back pain 06/15/2017  ? Postoperative state 03/11/2017  ? Trigger point 11/28/2016  ? Health care maintenance 02/04/2016  ? Severe episode of recurrent major depressive disorder, with psychotic features ([place]) 08/22/2015  ? GAD (generalized anxiety disorder) 08/22/2015  ? PTSD (post-traumatic stress disorder) 08/22/2015  ? Insomnia 08/22/2015  ? GERD (gastroesophageal reflux disease) 04/18/2015  ? Fibromyalgia 03/29/2015  ? Hypothyroidism   ? Type 2 diabetes mellitus  without complication (H[facility])   ? TIA (transient ischemic attack) 03/15/2015  ? Mild intermittent asthma 02/28/2012  ? Anxiety disorder 02/07/2012  ? Hypertension 01/07/2012  ? Hypothyroidism following radioiodine therapy 08/28/2011  ? Grave's disease 05/07/2011  ? Family hx-breast malignancy 11/20/2010  ? Chronic migraine without aura 05/04/2010  ? Left shoulder pain 09/08/2009  ? TOBACCO USER 03/11/2009  ? Notalgia 01/09/2009  ? OBESITY, UNSPECIFIED 09/27/2008  ? Mood disorder ([place]) 09/22/2008  ? ? ?Past Surgical History:  ?Procedure Laterality Date  ? AXILLARY LYMPH NODE DISSECTION  2001  ? CHOLECYSTECTOMY N/A 09/03/2018  ? Procedure: LAPAROSCOPIC CHOLECYSTECTOMY;  Surgeon: Coralie Keens, MD;  Location: [facility];  Service: General;  Laterality: N/A;  ? HYDRADENITIS EXCISION    ? TUBAL LIGATION  1995  ? VAGINAL HYSTERECTOMY Left 03/11/2017  ? Procedure: HYSTERECTOMY VAGINAL W/LEFT PROXIMAL SALPINGECTOMY;  Surgeon: Princess Bruins, MD;  Location: [facility];  Service: Gynecology;  Laterality: Left;  request 7:30am OR time ? ?request 2 hours ? ?  ? ? ?OB History   ? ? Gravida  ?5  ? Para  ?   ? Term  ?   ? Preterm  ?   ? AB  ?2  ? Living  ?4  ?  ? ? SAB  ?   ? IAB  ?   ? Ectopic  ?  0  ? Multiple  ?   ? Live Births  ?   ?   ?  ?  ? ? ? ?Home Medications   ? ?Prior to Admission medications   ?Medication Sig Start Date End Date Taking? Authorizing Provider  ?cyclobenzaprine (FLEXERIL) 5 MG tablet Take 1 tablet (5 mg total) by mouth 3 (three) times daily as needed for muscle spasms. 10/22/21  Yes Ward, Lenise Arena, PA-C  ?albuterol (PROAIR HFA) 108 (90 Base) MCG/ACT inhaler Inhale 2 puffs into the lungs every 4 (four) hours as needed for wheezing. 07/31/17   Nicolette Bang, MD  ?albuterol (PROVENTIL) (2.5 MG/3ML) 0.083% nebulizer solution Take 3 mLs by nebulization 4 (four) times daily as needed for wheezing or shortness of breath.  03/02/18   [provider]  ?amLODipine (NORVASC) 2.5 MG tablet Take 2.5 mg by  mouth daily. 08/11/19   [provider]  ?aspirin EC 81 MG tablet Take 81 mg by mouth daily. Swallow whole.    [provider]  ?BD PEN NEEDLE NANO 2ND GEN 32G X 4 MM MISC USE TO INJECT LANTUS UNDER THE SKIN EVERY MORNING 03/30/21   Renato Shin, MD  ?benzonatate (TESSALON) 200 MG capsule Take 200 mg by mouth 3 (three) times daily as needed. 12/29/20   [provider]  ?Blood Glucose Calibration (ONETOUCH VERIO) SOLN Used to check meter accuracy ?Patient taking differently: 1 each by Other route as needed. Use to calibrate meter prn 02/26/17   Renato Shin, MD  ?Blood Glucose Monitoring Suppl (ONETOUCH VERIO) w/Device KIT 1 each by Other route 2 (two) times daily.  03/23/18   [provider]  ?clotrimazole (MYCELEX) 10 MG troche Take 1 tablet (10 mg total) by mouth 5 (five) times daily. 10/26/20   Renato Shin, MD  ?DULoxetine (CYMBALTA) 60 MG capsule Take 60 mg by mouth 2 (two) times daily. 08/09/19   [provider]  ?Verdell Face 120 MG/ML SOAJ ADMINISTER 1 ML UNDER THE SKIN EVERY 30 DAYS 10/11/21   Pieter Partridge, DO  ?famciclovir (FAMVIR) 500 MG tablet Take 500 mg by mouth 2 (two) times daily. 09/23/20   [provider]  ?glucose blood (ONETOUCH VERIO) test strip CHECK YOUR BLOOD SUGAR ONCE DAILY(VARY THE TIME OF DAY WHEN YOU CHECK BETWEEN, BEFORE THE 3 MEALS, AND AT BEDTIME) 07/06/21   Renato Shin, MD  ?hydrOXYzine (VISTARIL) 25 MG capsule Take 25 mg by mouth 3 (three) times daily as needed for itching.    [provider]  ?insulin glargine, 2 Unit Dial, (TOUJEO MAX SOLOSTAR) 300 UNIT/ML Solostar Pen Inject 110 Units into the skin every morning. 10/18/21   Renato Shin, MD  ?lamoTRIgine (LAMICTAL) 100 MG tablet Take 100 mg by mouth daily. 09/12/20   [provider]  ?Lancets (ONETOUCH DELICA PLUS [id number]) MISC USE TO TEST BLOOD GLUCOSE ONCE DAILY, ALTERNATING TESTING TIMES 07/26/21   Renato Shin, MD  ?levothyroxine (SYNTHROID) 112 MCG tablet Take 2  tablets (224 mcg total) by mouth daily. 10/03/21   Renato Shin, MD  ?lidocaine (LIDODERM) 5 % UNWRAP AND APPLY 1 PATCH TO SKIN DAILY FOR 15 DAYS. REMOVE AND DISCARD PATCH WITHIN 12 HOURS OR AS DIRECTED ?Patient taking differently: Place 1 patch onto the skin daily as needed (pain). 02/09/18   Sherene Sires, DO  ?linaclotide (LINZESS) 290 MCG CAPS capsule Take 290 mcg by mouth daily before breakfast.     [provider]  ?Magnesium 250 MG TABS Take 750 mg by mouth at bedtime.  [provider]  ?mirtazapine (REMERON) 7.5 MG tablet Take 7.5 mg by mouth at bedtime. 01/21/21   [provider]  ?montelukast (SINGULAIR) 10 MG tablet Take 10 mg by mouth daily.  03/16/18   [provider]  ?omeprazole (PRILOSEC) 40 MG capsule Take 40 mg by mouth 2 (two) times daily.     [provider]  ?pravastatin (PRAVACHOL) 20 MG tablet Take 20 mg by mouth daily.    [provider]  ?pregabalin (LYRICA) 50 MG capsule Take 100 mg by mouth daily.    [provider]  ?promethazine (PHENERGAN) 12.5 MG tablet Take 12.5 mg by mouth every 6 (six) hours as needed for nausea or vomiting.    [provider]  ?rizatriptan (MAXALT-MLT) 10 MG disintegrating tablet DISSOLVE 1 TABLET ON THE TONGUE AT ONSET OF HEADACHE. MAY REPEAT 1 TIME IN 2 HOURS AS NEEDED 10/23/20   Rondel Jumbo, PA-C  ?triamcinolone (KENALOG) 0.1 % Apply 1 application topically 2 (two) times daily as needed (itching). 07/05/20   [provider]  ?zonisamide (ZONEGRAN) 50 MG capsule Take 1 capsule (50 mg total) by mouth daily. 10/23/20   Rondel Jumbo, PA-C  ? ? ?Family History ?Family History  ?Problem Relation Age of Onset  ? Hypertension Mother   ? Diabetes Mother   ? Cancer Mother   ?     rectal  ? Heart disease Father 51  ?     MI x5  ? Hypertension Father   ? Diabetes Father   ? Drug abuse Brother   ? Alcohol abuse Brother   ? Breast cancer Sister   ?     unsure of age , was between 51 and 60  ?  Drug abuse Brother   ? Alcohol abuse Brother   ? Anxiety disorder Neg Hx   ? Bipolar disorder Neg Hx   ? Depression Neg Hx   ? ? ?Social History ?Social History  ? ?Tobacco Use  ? Smoking status: Every Day  ?

## 2021-10-22 NOTE — Discharge Instructions (Signed)
Can take '600mg'$  Ibuprofen as needed  ?Take Flexeril as needed for muscle spasm ?Recommend gentle stretching and heat to affected area, gentle massage ?If no improvement follow up with PCP  ?

## 2021-10-22 NOTE — ED Triage Notes (Signed)
Pt reports a lump on Rt side of neck that ia painful. Pt reports she has received a shot before that made the lump go down. ?

## 2021-10-31 ENCOUNTER — Other Ambulatory Visit: Payer: Self-pay

## 2021-10-31 DIAGNOSIS — E119 Type 2 diabetes mellitus without complications: Secondary | ICD-10-CM

## 2021-10-31 MED ORDER — ONETOUCH VERIO VI STRP: ORAL_STRIP | 1 refills | Status: DC

## 2021-10-31 MED ORDER — ONETOUCH DELICA PLUS LANCET33G MISC: 0 refills | Status: DC

## 2021-10-31 MED ORDER — BD PEN NEEDLE NANO 2ND GEN 32G X 4 MM MISC: 3 refills | Status: DC

## 2021-11-15 ENCOUNTER — Emergency Department (HOSPITAL_COMMUNITY): Payer: 59

## 2021-11-15 ENCOUNTER — Observation Stay (HOSPITAL_BASED_OUTPATIENT_CLINIC_OR_DEPARTMENT_OTHER): Payer: 59

## 2021-11-15 ENCOUNTER — Other Ambulatory Visit: Payer: Self-pay

## 2021-11-15 ENCOUNTER — Observation Stay (HOSPITAL_COMMUNITY)
Admission: EM | Admit: 2021-11-15 | Discharge: 2021-11-17 | Disposition: A | Discharge status: Home / Self Care | Payer: 59 | Attending: Internal Medicine | Admitting: Internal Medicine

## 2021-11-15 ENCOUNTER — Observation Stay (HOSPITAL_COMMUNITY): Payer: 59

## 2021-11-15 ENCOUNTER — Encounter (HOSPITAL_COMMUNITY): Payer: Self-pay

## 2021-11-15 ENCOUNTER — Telehealth: Payer: Self-pay | Admitting: Neurology

## 2021-11-15 DIAGNOSIS — E6609 Other obesity due to excess calories: Secondary | ICD-10-CM

## 2021-11-15 DIAGNOSIS — E1169 Type 2 diabetes mellitus with other specified complication: Secondary | ICD-10-CM | POA: Diagnosis not present

## 2021-11-15 DIAGNOSIS — Z8673 Personal history of transient ischemic attack (TIA), and cerebral infarction without residual deficits: Secondary | ICD-10-CM | POA: Diagnosis not present

## 2021-11-15 DIAGNOSIS — Z20822 Contact with and (suspected) exposure to covid-19: Secondary | ICD-10-CM | POA: Insufficient documentation

## 2021-11-15 DIAGNOSIS — Z794 Long term (current) use of insulin: Secondary | ICD-10-CM | POA: Diagnosis not present

## 2021-11-15 DIAGNOSIS — M4802 Spinal stenosis, cervical region: Secondary | ICD-10-CM | POA: Diagnosis not present

## 2021-11-15 DIAGNOSIS — F39 Unspecified mood [affective] disorder: Secondary | ICD-10-CM | POA: Diagnosis present

## 2021-11-15 DIAGNOSIS — I10 Essential (primary) hypertension: Secondary | ICD-10-CM | POA: Diagnosis present

## 2021-11-15 DIAGNOSIS — F17200 Nicotine dependence, unspecified, uncomplicated: Secondary | ICD-10-CM | POA: Diagnosis present

## 2021-11-15 DIAGNOSIS — Z91040 Latex allergy status: Secondary | ICD-10-CM | POA: Diagnosis not present

## 2021-11-15 DIAGNOSIS — Z6837 Body mass index (BMI) 37.0-37.9, adult: Secondary | ICD-10-CM | POA: Insufficient documentation

## 2021-11-15 DIAGNOSIS — Z7982 Long term (current) use of aspirin: Secondary | ICD-10-CM | POA: Diagnosis not present

## 2021-11-15 DIAGNOSIS — R200 Anesthesia of skin: Secondary | ICD-10-CM | POA: Diagnosis present

## 2021-11-15 DIAGNOSIS — F17210 Nicotine dependence, cigarettes, uncomplicated: Secondary | ICD-10-CM | POA: Insufficient documentation

## 2021-11-15 DIAGNOSIS — E785 Hyperlipidemia, unspecified: Secondary | ICD-10-CM | POA: Diagnosis present

## 2021-11-15 DIAGNOSIS — E039 Hypothyroidism, unspecified: Secondary | ICD-10-CM | POA: Insufficient documentation

## 2021-11-15 DIAGNOSIS — R531 Weakness: Secondary | ICD-10-CM | POA: Insufficient documentation

## 2021-11-15 DIAGNOSIS — G4733 Obstructive sleep apnea (adult) (pediatric): Secondary | ICD-10-CM | POA: Diagnosis not present

## 2021-11-15 DIAGNOSIS — Z79899 Other long term (current) drug therapy: Secondary | ICD-10-CM | POA: Diagnosis not present

## 2021-11-15 DIAGNOSIS — E119 Type 2 diabetes mellitus without complications: Secondary | ICD-10-CM

## 2021-11-15 DIAGNOSIS — R29898 Other symptoms and signs involving the musculoskeletal system: Secondary | ICD-10-CM

## 2021-11-15 DIAGNOSIS — R4781 Slurred speech: Secondary | ICD-10-CM

## 2021-11-15 DIAGNOSIS — R29818 Other symptoms and signs involving the nervous system: Principal | ICD-10-CM | POA: Insufficient documentation

## 2021-11-15 DIAGNOSIS — R079 Chest pain, unspecified: Secondary | ICD-10-CM | POA: Diagnosis not present

## 2021-11-15 DIAGNOSIS — E890 Postprocedural hypothyroidism: Secondary | ICD-10-CM | POA: Diagnosis present

## 2021-11-15 DIAGNOSIS — G459 Transient cerebral ischemic attack, unspecified: Secondary | ICD-10-CM | POA: Diagnosis not present

## 2021-11-15 DIAGNOSIS — J45909 Unspecified asthma, uncomplicated: Secondary | ICD-10-CM | POA: Insufficient documentation

## 2021-11-15 DIAGNOSIS — R2990 Unspecified symptoms and signs involving the nervous system: Secondary | ICD-10-CM

## 2021-11-15 LAB — CBC WITH DIFFERENTIAL/PLATELET
Abs Immature Granulocytes: 0.06 10*3/uL (ref 0.00–0.07)
Basophils Absolute: 0 10*3/uL (ref 0.0–0.1)
Basophils Relative: 0 %
Eosinophils Absolute: 0.1 10*3/uL (ref 0.0–0.5)
Eosinophils Relative: 2 %
HCT: 45 % (ref 36.0–46.0)
Hemoglobin: 14.8 g/dL (ref 12.0–15.0)
Immature Granulocytes: 1 %
Lymphocytes Relative: 29 %
Lymphs Abs: 2.2 10*3/uL (ref 0.7–4.0)
MCH: 27.2 pg (ref 26.0–34.0)
MCHC: 32.9 g/dL (ref 30.0–36.0)
MCV: 82.6 fL (ref 80.0–100.0)
Monocytes Absolute: 0.6 10*3/uL (ref 0.1–1.0)
Monocytes Relative: 8 %
Neutro Abs: 4.5 10*3/uL (ref 1.7–7.7)
Neutrophils Relative %: 60 %
Platelets: 306 10*3/uL (ref 150–400)
RBC: 5.45 MIL/uL — ABNORMAL HIGH (ref 3.87–5.11)
RDW: 14.4 % (ref 11.5–15.5)
WBC: 7.5 10*3/uL (ref 4.0–10.5)
nRBC: 0 % (ref 0.0–0.2)

## 2021-11-15 LAB — COMPREHENSIVE METABOLIC PANEL
ALT: 58 U/L — ABNORMAL HIGH (ref 0–44)
AST: 42 U/L — ABNORMAL HIGH (ref 15–41)
Albumin: 3.4 g/dL — ABNORMAL LOW (ref 3.5–5.0)
Alkaline Phosphatase: 286 U/L — ABNORMAL HIGH (ref 38–126)
Anion gap: 9 (ref 5–15)
BUN: 11 mg/dL (ref 6–20)
CO2: 27 mmol/L (ref 22–32)
Calcium: 9.2 mg/dL (ref 8.9–10.3)
Chloride: 105 mmol/L (ref 98–111)
Creatinine, Ser: 0.81 mg/dL (ref 0.44–1.00)
GFR, Estimated: >60 mL/min (ref >60)
Glucose, Bld: 92 mg/dL (ref 70–99)
Potassium: 4 mmol/L (ref 3.5–5.1)
Sodium: 141 mmol/L (ref 135–145)
Total Bilirubin: 0.7 mg/dL (ref 0.3–1.2)
Total Protein: 7.3 g/dL (ref 6.5–8.1)

## 2021-11-15 LAB — ECHOCARDIOGRAM COMPLETE
AR max vel: 2.27 cm2
AV Peak grad: 7.6 mmHg
Ao pk vel: 1.38 m/s
Area-P 1/2: 3.89 cm2
Height: 63.5 in
S' Lateral: 2.4 cm
Weight: 3440 oz

## 2021-11-15 LAB — TSH: TSH: <0.01 u[IU]/mL — ABNORMAL LOW (ref 0.350–4.500)

## 2021-11-15 LAB — GLUCOSE, CAPILLARY
Glucose-Capillary: 321 mg/dL — ABNORMAL HIGH (ref 70–99)
Glucose-Capillary: 100 mg/dL — ABNORMAL HIGH (ref 70–99)

## 2021-11-15 LAB — PROTIME-INR
INR: 1 (ref 0.8–1.2)
Prothrombin Time: 13.1 seconds (ref 11.4–15.2)

## 2021-11-15 LAB — TROPONIN I (HIGH SENSITIVITY)
Troponin I (High Sensitivity): 6 ng/L (ref <18)
Troponin I (High Sensitivity): 6 ng/L (ref <18)

## 2021-11-15 LAB — SARS CORONAVIRUS 2 BY RT PCR: SARS Coronavirus 2 by RT PCR: NEGATIVE

## 2021-11-15 LAB — T4, FREE: Free T4: 1.4 ng/dL — ABNORMAL HIGH (ref 0.61–1.12)

## 2021-11-15 MED ORDER — IOHEXOL 350 MG/ML SOLN
80.0000 mL | Freq: Once | INTRAVENOUS | Status: AC | PRN
  Administered 2021-11-15: 80 mL via INTRAVENOUS

## 2021-11-15 MED ORDER — ENOXAPARIN SODIUM 40 MG/0.4ML IJ SOSY
40.0000 mg | PREFILLED_SYRINGE | INTRAMUSCULAR | Status: DC
  Administered 2021-11-15 – 2021-11-16 (×2): 40 mg via SUBCUTANEOUS
  Filled 2021-11-15 (×2): qty 0.4

## 2021-11-15 MED ORDER — ASPIRIN 81 MG PO TBEC
81.0000 mg | DELAYED_RELEASE_TABLET | Freq: Every day | ORAL | Status: DC
  Administered 2021-11-15 – 2021-11-17 (×3): 81 mg via ORAL
  Filled 2021-11-15 (×3): qty 1

## 2021-11-15 MED ORDER — STROKE: EARLY STAGES OF RECOVERY BOOK
Freq: Once | Status: DC
  Filled 2021-11-15 (×2): qty 1

## 2021-11-15 MED ORDER — MONTELUKAST SODIUM 10 MG PO TABS
10.0000 mg | ORAL_TABLET | Freq: Every day | ORAL | Status: DC
  Administered 2021-11-16 – 2021-11-17 (×2): 10 mg via ORAL
  Filled 2021-11-15 (×2): qty 1

## 2021-11-15 MED ORDER — DULOXETINE HCL 60 MG PO CPEP
60.0000 mg | ORAL_CAPSULE | Freq: Two times a day (BID) | ORAL | Status: DC
  Administered 2021-11-15 – 2021-11-17 (×4): 60 mg via ORAL
  Filled 2021-11-15 (×6): qty 1

## 2021-11-15 MED ORDER — INSULIN ASPART 100 UNIT/ML IJ SOLN
0.0000 [IU] | Freq: Three times a day (TID) | INTRAMUSCULAR | Status: DC
  Administered 2021-11-15: 11 [IU] via SUBCUTANEOUS
  Administered 2021-11-16: 8 [IU] via SUBCUTANEOUS

## 2021-11-15 MED ORDER — ALBUTEROL SULFATE (2.5 MG/3ML) 0.083% IN NEBU: 2.5000 mg | INHALATION_SOLUTION | RESPIRATORY_TRACT | Status: DC | PRN

## 2021-11-15 MED ORDER — INSULIN GLARGINE-YFGN 100 UNIT/ML ~~LOC~~ SOLN
110.0000 [IU] | SUBCUTANEOUS | Status: DC
  Filled 2021-11-15 (×3): qty 1.1

## 2021-11-15 MED ORDER — LEVOTHYROXINE SODIUM 112 MCG PO TABS: 224.0000 ug | ORAL_TABLET | Freq: Every day | ORAL | Status: DC

## 2021-11-15 MED ORDER — ACETAMINOPHEN 160 MG/5ML PO SOLN: 650.0000 mg | ORAL | Status: DC | PRN

## 2021-11-15 MED ORDER — NICOTINE 14 MG/24HR TD PT24
14.0000 mg | MEDICATED_PATCH | Freq: Every day | TRANSDERMAL | Status: DC
  Administered 2021-11-15 – 2021-11-17 (×3): 14 mg via TRANSDERMAL
  Filled 2021-11-15 (×3): qty 1

## 2021-11-15 MED ORDER — LINACLOTIDE 145 MCG PO CAPS
290.0000 ug | ORAL_CAPSULE | Freq: Every day | ORAL | Status: DC
  Administered 2021-11-16 – 2021-11-17 (×2): 290 ug via ORAL
  Filled 2021-11-15 (×2): qty 2

## 2021-11-15 MED ORDER — FAMCICLOVIR 500 MG PO TABS
500.0000 mg | ORAL_TABLET | Freq: Two times a day (BID) | ORAL | Status: DC
  Administered 2021-11-15 – 2021-11-17 (×4): 500 mg via ORAL
  Filled 2021-11-15 (×6): qty 1

## 2021-11-15 MED ORDER — LEVOTHYROXINE SODIUM 100 MCG PO TABS
200.0000 ug | ORAL_TABLET | Freq: Every day | ORAL | Status: DC
  Administered 2021-11-16: 200 ug via ORAL
  Filled 2021-11-15 (×2): qty 2

## 2021-11-15 MED ORDER — LAMOTRIGINE 150 MG PO TABS
150.0000 mg | ORAL_TABLET | Freq: Every day | ORAL | Status: DC
Start: 2021-11-15 — End: 2021-11-17
  Administered 2021-11-15 – 2021-11-16 (×2): 150 mg via ORAL
  Filled 2021-11-15 (×5): qty 1

## 2021-11-15 MED ORDER — ATORVASTATIN CALCIUM 10 MG PO TABS
20.0000 mg | ORAL_TABLET | Freq: Every evening | ORAL | Status: DC
  Administered 2021-11-15 – 2021-11-16 (×2): 20 mg via ORAL
  Filled 2021-11-15 (×2): qty 2

## 2021-11-15 MED ORDER — ACETAMINOPHEN 325 MG PO TABS
650.0000 mg | ORAL_TABLET | ORAL | Status: DC | PRN
  Administered 2021-11-16: 650 mg via ORAL
  Filled 2021-11-15: qty 2

## 2021-11-15 MED ORDER — MIRTAZAPINE 7.5 MG PO TABS
7.5000 mg | ORAL_TABLET | Freq: Every day | ORAL | Status: DC
  Administered 2021-11-15 – 2021-11-16 (×2): 7.5 mg via ORAL
  Filled 2021-11-15 (×5): qty 1

## 2021-11-15 MED ORDER — LORAZEPAM 2 MG/ML IJ SOLN
1.0000 mg | Freq: Once | INTRAMUSCULAR | Status: AC | PRN
  Administered 2021-11-15: 1 mg via INTRAVENOUS
  Filled 2021-11-15: qty 1

## 2021-11-15 MED ORDER — INSULIN ASPART 100 UNIT/ML IJ SOLN: 0.0000 [IU] | Freq: Every day | INTRAMUSCULAR | Status: DC

## 2021-11-15 MED ORDER — ACETAMINOPHEN 650 MG RE SUPP: 650.0000 mg | RECTAL | Status: DC | PRN

## 2021-11-15 MED ORDER — TRAMADOL HCL 50 MG PO TABS
50.0000 mg | ORAL_TABLET | Freq: Once | ORAL | Status: AC
  Administered 2021-11-15: 50 mg via ORAL
  Filled 2021-11-15: qty 1

## 2021-11-15 MED ORDER — VALACYCLOVIR HCL 500 MG PO TABS
1000.0000 mg | ORAL_TABLET | Freq: Every day | ORAL | Status: DC
Start: 2021-11-16 — End: 2021-11-15

## 2021-11-15 MED ORDER — HYDROXYZINE HCL 25 MG PO TABS
25.0000 mg | ORAL_TABLET | Freq: Three times a day (TID) | ORAL | Status: DC | PRN
  Administered 2021-11-15: 25 mg via ORAL
  Filled 2021-11-15: qty 1

## 2021-11-15 NOTE — H&P (Signed)
History and Physical    Patient: Kendra Hall WUJ:[id number] DOB: 03/11/1966 DOA: 11/15/2021 DOS: the patient was seen and examined on 11/15/2021 PCP: Simona Huh, NP  Patient coming from: Home - lives with daughter; Donald Prose: Daughter, [phone]   Chief Complaint: Stroke-like symptoms  HPI: Kendra Hall is a 56 y.o. female with medical history significant of depression; DM; TIA (2019); fibromyalgia; and Graves disease presenting with stroke-like symptoms. She had slurred speech for 2 days but didn't really pay attention, thought she was excessively tired.  Yesterday, she got off work at 4 and went to sleep about 430, got up at 9 to eat and went back to sleep.  She was ok, just felt very tired.  She got up overnight to went to the bathroom and everything was ok.  This AM about 715, she has LUE tingling and it wouldn't stop, all the way to her fingertips.  She had pain from her L trap and all the way down her L arm.  Her chest was "annoying" - didn't really hurt but bothered her enough to come in.  She called her job and they told her to call 911.  She has been having floaters in her B peripheral vision x 2 days.  Symptoms are not completely gone but resolved for the most part spontaneously.  She is currently eating fried chicken and french fries that her children brought in.    ER Course:  Slurred speech yesterday.  Woke up this AM with L arm heaviness, tingling in fingers. Also with peripheral vision changes and CP.  Labs ok.  CT and CTA head/neck unremarkable.  Still with left-sided symptoms.  Neurology recommends observation.     Review of Systems: As mentioned in the history of present illness. All other systems reviewed and are negative. Past Medical History:  Diagnosis Date   Allergy    Anemia    history of   Anxiety    Asthma    Depression    Diabetes mellitus    type 2    Fibromyalgia    Gastric ulcer    GERD (gastroesophageal reflux disease)    Graves disease    H/O  therapeutic radiation    Migraine    Multiple thyroid nodules    Myofacial muscle pain    Osteoarthritis    Facet hypertrophy with injections   TIA (transient ischemic attack)    02/2015   Past Surgical History:  Procedure Laterality Date   AXILLARY LYMPH NODE DISSECTION  2001   CHOLECYSTECTOMY N/A 09/03/2018   Procedure: LAPAROSCOPIC CHOLECYSTECTOMY;  Surgeon: Coralie Keens, MD;  Location: [facility];  Service: General;  Laterality: N/A;   [facility] Left 03/11/2017   Procedure: HYSTERECTOMY VAGINAL W/LEFT PROXIMAL SALPINGECTOMY;  Surgeon: Princess Bruins, MD;  Location: [facility];  Service: Gynecology;  Laterality: Left;  request 7:30am OR time  request 2 hours     Social History:  reports that she has been smoking cigarettes. She has a 20.50 pack-year smoking history. She has never used smokeless tobacco. She reports that she does not currently use drugs. She reports that she does not drink alcohol.  Allergies  Allergen Reactions   Gabapentin Other (See Comments)    unknown   Eggs Or Egg-Derived Products Other (See Comments)    Don't eat eggs   Ergotrate [Ergonovine] Other (See Comments)    Pt does not ever want   Ibuprofen Other (See  Comments)    Patient has ulcer   Latex Other (See Comments)    Breaks out   Metformin And Related Other (See Comments)    Dizziness   Yutopar [Ritodrine] Other (See Comments)    Pt does not ever want    Family History  Problem Relation Age of Onset   Hypertension Mother    Diabetes Mother    Cancer Mother        rectal   Stroke Father    Heart disease Father 77       MI x5   Hypertension Father    Diabetes Father    Breast cancer Sister        unsure of age , was between 79 and 37   Drug abuse Brother    Alcohol abuse Brother    Drug abuse Brother    Alcohol abuse Brother    Anxiety disorder Neg Hx    Bipolar disorder Neg Hx    Depression Neg Hx     Prior to  Admission medications   Medication Sig Start Date End Date Taking? Authorizing Provider  acetaminophen (TYLENOL) 500 MG tablet Take 1,000 mg by mouth every 6 (six) hours as needed for moderate pain.   Yes [provider]  albuterol (PROAIR HFA) 108 (90 Base) MCG/ACT inhaler Inhale 2 puffs into the lungs every 4 (four) hours as needed for wheezing. 07/31/17  Yes Nicolette Bang, MD  albuterol (PROVENTIL) (2.5 MG/3ML) 0.083% nebulizer solution Take 3 mLs by nebulization 4 (four) times daily as needed for wheezing or shortness of breath.  03/02/18  Yes [provider]  amLODipine (NORVASC) 10 MG tablet Take 10 mg by mouth daily. 10/21/21  Yes [provider]  atorvastatin (LIPITOR) 20 MG tablet Take 20 mg by mouth every evening. 10/17/21  Yes [provider]  benzonatate (TESSALON) 200 MG capsule Take 200 mg by mouth 3 (three) times daily as needed for cough. 12/29/20  Yes [provider]  DULoxetine (CYMBALTA) 60 MG capsule Take 60 mg by mouth 2 (two) times daily. 08/09/19  Yes [provider]  EMGALITY 120 MG/ML SOAJ ADMINISTER 1 ML UNDER THE SKIN EVERY 30 DAYS Patient taking differently: Inject 1 mL into the skin every 30 (thirty) days. 10/11/21  Yes Tomi Likens, Adam R, DO  famciclovir (FAMVIR) 500 MG tablet Take 500 mg by mouth 2 (two) times daily. 09/23/20  Yes [provider]  hydrOXYzine (VISTARIL) 25 MG capsule Take 25 mg by mouth 3 (three) times daily as needed for itching.   Yes [provider]  insulin glargine, 2 Unit Dial, (TOUJEO MAX SOLOSTAR) 300 UNIT/ML Solostar Pen Inject 110 Units into the skin every morning. 10/18/21  Yes Renato Shin, MD  lamoTRIgine (LAMICTAL) 150 MG tablet Take 150 mg by mouth daily. 11/06/21  Yes [provider]  levothyroxine (SYNTHROID) 112 MCG tablet Take 2 tablets (224 mcg total) by mouth daily. 10/03/21  Yes Renato Shin, MD  linaclotide [facility]) 290 MCG CAPS capsule Take 290 mcg by  mouth daily before breakfast.    Yes [provider]  Magnesium 250 MG TABS Take 750 mg by mouth at bedtime.   Yes [provider]  mirtazapine (REMERON) 7.5 MG tablet Take 7.5 mg by mouth at bedtime. 01/21/21  Yes [provider]  montelukast (SINGULAIR) 10 MG tablet Take 10 mg by mouth daily.  03/16/18  Yes [provider]  promethazine (PHENERGAN) 12.5 MG tablet Take 12.5 mg by mouth every 6 (  six) hours as needed for nausea or vomiting.   Yes [provider]  rizatriptan (MAXALT-MLT) 10 MG disintegrating tablet DISSOLVE 1 TABLET ON THE TONGUE AT ONSET OF HEADACHE. MAY REPEAT 1 TIME IN 2 HOURS AS NEEDED 10/23/20  Yes Elease Hashimoto  aspirin EC 81 MG tablet Take 81 mg by mouth daily. Swallow whole. Patient not taking: Reported on 11/15/2021    [provider]  cyclobenzaprine (FLEXERIL) 5 MG tablet Take 1 tablet (5 mg total) by mouth 3 (three) times daily as needed for muscle spasms. Patient not taking: Reported on 11/15/2021 10/22/21   Ward, Janett Billow Z, PA-C  glucose blood (ONETOUCH VERIO) test strip CHECK YOUR BLOOD SUGAR ONCE DAILY(VARY THE TIME OF DAY WHEN YOU CHECK BETWEEN, BEFORE THE 3 MEALS, AND AT BEDTIME) 10/31/21   Shamleffer, Melanie Crazier, MD  Insulin Pen Needle (BD PEN NEEDLE NANO 2ND GEN) 32G X 4 MM MISC USE TO INJECT LANTUS UNDER THE SKIN EVERY MORNING 10/31/21   Shamleffer, Melanie Crazier, MD  zonisamide (ZONEGRAN) 50 MG capsule Take 1 capsule (50 mg total) by mouth daily. Patient not taking: Reported on 11/15/2021 10/23/20   Rondel Jumbo, PA-C    Physical Exam: Vitals:   11/15/21 1245 11/15/21 1300 11/15/21 1315 11/15/21 1330  BP:   137/79 (!) 119/55  Pulse: 83 80 79 81  Resp: '18 15 19 18  '$ Temp:      TempSrc:      SpO2: 96% 98% 96% 97%  Weight:      Height:       General:  Appears calm and comfortable and is in NAD Eyes:  PERRL, EOMI, normal lids, iris ENT:  grossly normal hearing, lips & tongue, mmm; mostly absent  dentition Neck:  no LAD, masses or thyromegaly Cardiovascular:  RRR, no m/r/g. No LE edema.  Respiratory:   CTA bilaterally with no wheezes/rales/rhonchi.  Normal respiratory effort. Abdomen:  soft, NT, ND Skin:  no rash or induration seen on limited exam Musculoskeletal:  grossly normal tone BUE/BLE, good ROM, no bony abnormality, inconsistent diffuse left-sided weakness on exam 4/5 but not noticeable while patient was eating or moving in the bed Psychiatric:  grossly normal mood and affect, speech fluent and appropriate, AOx3 Neurologic:  CN 2-12 grossly intact, moves all extremities in coordinated fashion, sensation diffusely diminished on left face and body per patient report   Radiological Exams on Admission: Independently reviewed - see discussion in A/P where applicable  CT ANGIO HEAD NECK W WO CM  Result Date: 11/15/2021 CLINICAL DATA:  Provided history: Neuro deficit, acute, stroke suspected. Additional history obtained from [place] arm numbness, headache, left-sided chest pain, shortness of breath, nausea. EXAM: CT ANGIOGRAPHY HEAD AND NECK TECHNIQUE: Multidetector CT imaging of the head and neck was performed using the standard protocol during bolus administration of intravenous contrast. Multiplanar CT image reconstructions and MIPs were obtained to evaluate the vascular anatomy. Carotid stenosis measurements (when applicable) are obtained utilizing NASCET criteria, using the distal internal carotid diameter as the denominator. RADIATION DOSE REDUCTION: This exam was performed according to the departmental dose-optimization program which includes automated exposure control, adjustment of the mA and/or kV according to patient size and/or use of iterative reconstruction technique. CONTRAST:  29m OMNIPAQUE IOHEXOL 350 MG/ML SOLN COMPARISON:  Brain MRI 11/11/2020. Head CT 11/11/2020. MRA head 09/26/2017. FINDINGS: CT HEAD FINDINGS Brain: Cerebral volume is normal.  Patchy and ill-defined hypoattenuation within the cerebral white matter, nonspecific but compatible with mild chronic  small vessel ischemic disease. There is no acute intracranial hemorrhage. No demarcated cortical infarct. No extra-axial fluid collection. No evidence of an intracranial mass. No midline shift. Vascular: No hyperdense vessel. Skull: No fracture or aggressive osseous lesion. Sinuses: No significant paranasal sinus disease. Orbits: Bilateral proptosis. No orbital mass or acute orbital finding. Review of the MIP images confirms the above findings CTA NECK FINDINGS Aortic arch: The left vertebral artery arises directly from the aortic arch. Common origin of the innominate and left common carotid arteries. The visualized aortic arch is normal in caliber. Streak and beam hardening artifact arising from a dense right-sided contrast bolus partially obscures the right subclavian artery. Within this limitation, there is no appreciable hemodynamically significant innominate or proximal subclavian artery stenosis. Right carotid system: CCA and ICA patent within the neck without stenosis or significant atherosclerotic disease. Left carotid system: CCA and ICA patent within the neck without stenosis or significant atherosclerotic disease. Vertebral arteries: The right vertebral artery is dominant. Vertebral arteries patent within the neck. Moderate V2 right vertebral artery at C4-C5 level due to mass effect from degenerative bony spurring. Otherwise, there is no significant stenosis within the cervical vertebral arteries. Skeleton: Cervical spondylosis with multilevel disc space narrowing, disc bulges/central disc protrusions, endplate spurring and uncovertebral hypertrophy. Disc space narrowing is advanced at C4-C5, C5-C6 and C6-C7. Multilevel spinal canal stenosis. Most notably, spinal canal stenosis appears moderate/severe at C4-C5 and severe at C5-C6. Multilevel bony neural foraminal narrowing. Other neck: No  neck mass or cervical lymphadenopathy. Diminutive thyroid gland. Upper chest: No consolidation within the imaged lung apices. Review of the MIP images confirms the above findings CTA HEAD FINDINGS Anterior circulation: The intracranial vertebral arteries are patent.Nonstenotic atherosclerotic plaque within both vessels. The M1 middle cerebral arteries are patent. No M2 proximal branch occlusion or high-grade proximal stenosis. The anterior cerebral arteries are patent. No intracranial aneurysm is identified. Posterior circulation: The intracranial vertebral arteries are patent. The basilar artery is patent. The posterior cerebral arteries are patent. Posterior communicating arteries are present bilaterally. Venous sinuses: Within the limitations of contrast timing, no convincing thrombus. Anatomic variants: None significant. Review of the MIP images confirms the above findings IMPRESSION: CT head: 1. No evidence of acute intracranial abnormality. 2. Mild chronic small vessel ischemic changes within the cerebral white matter. 3. Bilateral proptosis. CTA neck: 1. The common carotid and internal carotid arteries are patent within the neck without stenosis or significant atherosclerotic disease. 2. Vertebral arteries patent within the neck. Moderate focal narrowing of the V2 right vertebral artery due to mass effect from degenerative bony spurring. 3. Cervical spondylosis, as described. Spinal canal stenosis is greatest at C4-C5 (at least moderate) and C5-C6 (severe). Multilevel bony neural foraminal narrowing. CTA head: 1. No intracranial large vessel occlusion or proximal high-grade arterial stenosis. 2. Nonstenotic calcified plaque within the intracranial ICAs, bilaterally. Electronically Signed   By: Kellie Simmering D.O.   On: 11/15/2021 11:32   CT HEAD WO CONTRAST (5MM)  Result Date: 11/15/2021 CLINICAL DATA:  Provided history: Neuro deficit, acute, stroke suspected. Additional history obtained from [place] arm numbness, headache, left-sided chest pain, shortness of breath, nausea. EXAM: CT ANGIOGRAPHY HEAD AND NECK TECHNIQUE: Multidetector CT imaging of the head and neck was performed using the standard protocol during bolus administration of intravenous contrast. Multiplanar CT image reconstructions and MIPs were obtained to evaluate the vascular anatomy. Carotid stenosis measurements (when applicable) are obtained utilizing NASCET criteria, using the distal internal carotid diameter as the  denominator. RADIATION DOSE REDUCTION: This exam was performed according to the departmental dose-optimization program which includes automated exposure control, adjustment of the mA and/or kV according to patient size and/or use of iterative reconstruction technique. CONTRAST:  49m OMNIPAQUE IOHEXOL 350 MG/ML SOLN COMPARISON:  Brain MRI 11/11/2020. Head CT 11/11/2020. MRA head 09/26/2017. FINDINGS: CT HEAD FINDINGS Brain: Cerebral volume is normal. Patchy and ill-defined hypoattenuation within the cerebral white matter, nonspecific but compatible with mild chronic small vessel ischemic disease. There is no acute intracranial hemorrhage. No demarcated cortical infarct. No extra-axial fluid collection. No evidence of an intracranial mass. No midline shift. Vascular: No hyperdense vessel. Skull: No fracture or aggressive osseous lesion. Sinuses: No significant paranasal sinus disease. Orbits: Bilateral proptosis. No orbital mass or acute orbital finding. Review of the MIP images confirms the above findings CTA NECK FINDINGS Aortic arch: The left vertebral artery arises directly from the aortic arch. Common origin of the innominate and left common carotid arteries. The visualized aortic arch is normal in caliber. Streak and beam hardening artifact arising from a dense right-sided contrast bolus partially obscures the right subclavian artery. Within this limitation, there is no appreciable hemodynamically  significant innominate or proximal subclavian artery stenosis. Right carotid system: CCA and ICA patent within the neck without stenosis or significant atherosclerotic disease. Left carotid system: CCA and ICA patent within the neck without stenosis or significant atherosclerotic disease. Vertebral arteries: The right vertebral artery is dominant. Vertebral arteries patent within the neck. Moderate V2 right vertebral artery at C4-C5 level due to mass effect from degenerative bony spurring. Otherwise, there is no significant stenosis within the cervical vertebral arteries. Skeleton: Cervical spondylosis with multilevel disc space narrowing, disc bulges/central disc protrusions, endplate spurring and uncovertebral hypertrophy. Disc space narrowing is advanced at C4-C5, C5-C6 and C6-C7. Multilevel spinal canal stenosis. Most notably, spinal canal stenosis appears moderate/severe at C4-C5 and severe at C5-C6. Multilevel bony neural foraminal narrowing. Other neck: No neck mass or cervical lymphadenopathy. Diminutive thyroid gland. Upper chest: No consolidation within the imaged lung apices. Review of the MIP images confirms the above findings CTA HEAD FINDINGS Anterior circulation: The intracranial vertebral arteries are patent.Nonstenotic atherosclerotic plaque within both vessels. The M1 middle cerebral arteries are patent. No M2 proximal branch occlusion or high-grade proximal stenosis. The anterior cerebral arteries are patent. No intracranial aneurysm is identified. Posterior circulation: The intracranial vertebral arteries are patent. The basilar artery is patent. The posterior cerebral arteries are patent. Posterior communicating arteries are present bilaterally. Venous sinuses: Within the limitations of contrast timing, no convincing thrombus. Anatomic variants: None significant. Review of the MIP images confirms the above findings IMPRESSION: CT head: 1. No evidence of acute intracranial abnormality. 2. Mild  chronic small vessel ischemic changes within the cerebral white matter. 3. Bilateral proptosis. CTA neck: 1. The common carotid and internal carotid arteries are patent within the neck without stenosis or significant atherosclerotic disease. 2. Vertebral arteries patent within the neck. Moderate focal narrowing of the V2 right vertebral artery due to mass effect from degenerative bony spurring. 3. Cervical spondylosis, as described. Spinal canal stenosis is greatest at C4-C5 (at least moderate) and C5-C6 (severe). Multilevel bony neural foraminal narrowing. CTA head: 1. No intracranial large vessel occlusion or proximal high-grade arterial stenosis. 2. Nonstenotic calcified plaque within the intracranial ICAs, bilaterally. Electronically Signed   By: KKellie SimmeringD.O.   On: 11/15/2021 11:32    EKG: Independently reviewed.  NSR with rate 86; low voltage with no evidence of acute ischemia  Labs on Admission: I have personally reviewed the available labs and imaging studies at the time of the admission.  Pertinent labs:     Glucose 286 AST 42/ALT 58 HS troponin 6, 6 Unremarkable CBC TSH <0.010 Free T4 1.4 COVID negative   Assessment and Plan: Principal Problem:   Stroke-like symptoms Active Problems:   Class 2 obesity due to excess calories with body mass index (BMI) of 37.0 to 37.9 in adult   TOBACCO USER   Mood disorder (HCC)   Hypothyroidism following radioiodine therapy   Hypertension   Type 2 diabetes mellitus without complication (HCC)   Severe obstructive sleep apnea-hypopnea syndrome   Dyslipidemia   Cervical stenosis of spine   Stroke-like symptoms -Patient presenting with some speech impairment for several days and more acute left-sided weakness, tingling -Concerning for TIA/CVA -Aspirin has been given to reduce stroke mortality and decrease morbidity -Will place in observation status for CVA/TIA evaluation -Telemetry monitoring -CT and CTA negative for stroke or LVO  but does show severe spinal stenosis which may be related to her symptoms; if negative MRI, consider neurosurgery consultation -MRI -Echo -Risk stratification with FLP, A1c; will also check TSH and UDS -Has not been taking ASA at home. Will defer about antiplatelet therapy to neurology for now. -Neurology consult -PT/OT/ST/Nutrition Consults  HTN -Allow permissive HTN for now -Treat BP only if >180, and then with goal of 15% reduction -Hold amlodipine   HLD -Check FLP -Continue Lipitor   DM -A1c -Continue glargine -Will order moderate-scale SSI  Hypothyroidism -h/o Graves s/p ablation -Now with hypothyroidism -Has been oversuppressed and dose was changed one month ago -Still oversuppressed - will decrease dose again and she will need recheck in 4-6 weeks (pharmacy requested to reduce dose)  Mood d/o -Continue Cymbalta, Lamictal, hydroxyzine, mirtazepine  OSA -Continue CPAP  Tobacco dependence -Encourage cessation.   -This was discussed with the patient and should be reviewed on an ongoing basis.   -Patch ordered at patient request.   Class 2 Obesity -Body mass index is 37.49 kg/m..  -Weight loss should be encouraged -Outpatient PCP/bariatric medicine/bariatric surgery f/u encouraged    Advance Care Planning:   Code Status: Full Code   Consults: Neurology; PT/OT/ST; Nutrition; TOC team  DVT Prophylaxis: Lovenox  Family Communication: Son and daughter were present throughout evaluation  Severity of Illness: The appropriate patient status for this patient is OBSERVATION. Observation status is judged to be reasonable and necessary in order to provide the required intensity of service to ensure the patient's safety. The patient's presenting symptoms, physical exam findings, and initial radiographic and laboratory data in the context of their medical condition is felt to place them at decreased risk for further clinical deterioration. Furthermore, it is anticipated  that the patient will be medically stable for discharge from the hospital within 2 midnights of admission.   Author: Karmen Bongo, MD 11/15/2021 3:53 PM  For on call review [email or web address].

## 2021-11-15 NOTE — ED Provider Notes (Signed)
[facility] EMERGENCY DEPARTMENT Provider Note   CSN: [id number] Arrival date & time: 11/15/21  0901     History  Chief Complaint  Patient presents with   Chest Pain   Numbness    Kendra Hall is a 56 y.o. female.  56 y.o female with a PMH of DM, TIA, [place] presents to the ED with a chief complaint of slurred speech, left arm weakness.  Patient currently employed as a Publishing copy for [employer], states approximately 2 days ago she noticed some changes in her speech, thought she was likely just tired.  However, reports after work yesterday going home and taking a 3-hour nap, waking up around 8 PM for going back to bed last night.  She reports going back to bed at 1 AM where she was last normal.  States waking up this morning around 7:30 AM and noticing some weakness to her left arm, reports this feels much heavier than on the right side. Also, there is a tingling sensation to her left fingers, she tried to shake this off like "it was sleep" but reports not sleeping on her left arm. Does have a prior history of TIAs, currently not anticoagulated.  She is also endorsing some vision changes, with visualization of shadows to her peripheral vision. She has not taken any medications for improvement in symptoms. She is also describing an "annoying" feeling to the left shoulder and left chest. No alleviating or exacerbating factors. In addition, complains of generalized weakness. No shortness of breath, no fever, no urinary symptoms.   The history is provided by the patient.  Chest Pain Pain location:  L chest Pain quality: aching   Pain radiates to:  Does not radiate Pain severity:  Moderate Chronicity:  New Associated symptoms: weakness   Associated symptoms: no abdominal pain, no back pain, no fever, no headache, no nausea, no shortness of breath and no vomiting       Home Medications Prior to Admission medications   Medication Sig Start Date End Date  Taking? Authorizing Provider  acetaminophen (TYLENOL) 500 MG tablet Take 1,000 mg by mouth every 6 (six) hours as needed for moderate pain.   Yes [provider]  albuterol (PROAIR HFA) 108 (90 Base) MCG/ACT inhaler Inhale 2 puffs into the lungs every 4 (four) hours as needed for wheezing. 07/31/17  Yes Nicolette Bang, MD  albuterol (PROVENTIL) (2.5 MG/3ML) 0.083% nebulizer solution Take 3 mLs by nebulization 4 (four) times daily as needed for wheezing or shortness of breath.  03/02/18  Yes [provider]  amLODipine (NORVASC) 10 MG tablet Take 10 mg by mouth daily. 10/21/21  Yes [provider]  atorvastatin (LIPITOR) 20 MG tablet Take 20 mg by mouth every evening. 10/17/21  Yes [provider]  benzonatate (TESSALON) 200 MG capsule Take 200 mg by mouth 3 (three) times daily as needed for cough. 12/29/20  Yes [provider]  DULoxetine (CYMBALTA) 60 MG capsule Take 60 mg by mouth 2 (two) times daily. 08/09/19  Yes [provider]  EMGALITY 120 MG/ML SOAJ ADMINISTER 1 ML UNDER THE SKIN EVERY 30 DAYS Patient taking differently: Inject 1 mL into the skin every 30 (thirty) days. 10/11/21  Yes Tomi Likens, Adam R, DO  famciclovir (FAMVIR) 500 MG tablet Take 500 mg by mouth 2 (two) times daily. 09/23/20  Yes [provider]  hydrOXYzine (VISTARIL) 25 MG capsule Take 25 mg by mouth 3 (three) times daily as needed for itching.  Yes [provider]  insulin glargine, 2 Unit Dial, (TOUJEO MAX SOLOSTAR) 300 UNIT/ML Solostar Pen Inject 110 Units into the skin every morning. 10/18/21  Yes Renato Shin, MD  lamoTRIgine (LAMICTAL) 150 MG tablet Take 150 mg by mouth at bedtime. 11/06/21  Yes [provider]  levothyroxine (SYNTHROID) 112 MCG tablet Take 2 tablets (224 mcg total) by mouth daily. 10/03/21  Yes Renato Shin, MD  linaclotide [facility]) 290 MCG CAPS capsule Take 290 mcg by mouth daily before breakfast.    Yes [provider]  Magnesium 250 MG TABS Take 750 mg by mouth at bedtime.   Yes [provider]  mirtazapine (REMERON) 7.5 MG tablet Take 7.5 mg by mouth at bedtime. 01/21/21  Yes [provider]  montelukast (SINGULAIR) 10 MG tablet Take 10 mg by mouth daily.  03/16/18  Yes [provider]  promethazine (PHENERGAN) 12.5 MG tablet Take 12.5 mg by mouth every 6 (six) hours as needed for nausea or vomiting.   Yes [provider]  rizatriptan (MAXALT-MLT) 10 MG disintegrating tablet DISSOLVE 1 TABLET ON THE TONGUE AT ONSET OF HEADACHE. MAY REPEAT 1 TIME IN 2 HOURS AS NEEDED Patient taking differently: Take 10 mg by mouth as needed for migraine. May repeat 1 time in 2 hours 10/23/20  Yes Shawn Route, Nancy Marus  aspirin EC 81 MG tablet Take 81 mg by mouth daily. Swallow whole. Patient not taking: Reported on 11/15/2021    [provider]  cyclobenzaprine (FLEXERIL) 5 MG tablet Take 1 tablet (5 mg total) by mouth 3 (three) times daily as needed for muscle spasms. Patient not taking: Reported on 11/15/2021 10/22/21   Ward, Janett Billow Z, PA-C  glucose blood (ONETOUCH VERIO) test strip CHECK YOUR BLOOD SUGAR ONCE DAILY(VARY THE TIME OF DAY WHEN YOU CHECK BETWEEN, BEFORE THE 3 MEALS, AND AT BEDTIME) 10/31/21   Shamleffer, Melanie Crazier, MD  Insulin Pen Needle (BD PEN NEEDLE NANO 2ND GEN) 32G X 4 MM MISC USE TO INJECT LANTUS UNDER THE SKIN EVERY MORNING 10/31/21   Shamleffer, Melanie Crazier, MD  zonisamide (ZONEGRAN) 50 MG capsule Take 1 capsule (50 mg total) by mouth daily. Patient not taking: Reported on 11/15/2021 10/23/20   Rondel Jumbo, PA-C      Allergies    Gabapentin, Eggs or egg-derived products, Ergotrate [ergonovine], Ibuprofen, Latex, Metformin and related, and Yutopar [ritodrine]    Review of Systems   Review of Systems  Constitutional:  Negative for chills and fever.  HENT:  Negative for sore throat and voice change.   Respiratory:  Negative for shortness of breath.    Cardiovascular:  Positive for chest pain.  Gastrointestinal:  Negative for abdominal pain, nausea and vomiting.  Genitourinary:  Negative for flank pain.  Musculoskeletal:  Negative for back pain, myalgias and neck pain.  Neurological:  Positive for speech difficulty and weakness. Negative for facial asymmetry and headaches.  All other systems reviewed and are negative.  Physical Exam Updated Vital Signs BP (!) 119/55   Pulse 81   Temp 97.9 F (36.6 C) (Oral)   Resp 18   Ht 5' 3.5" (1.613 m)   Wt 97.5 kg   LMP 12/06/2016 (Approximate)   SpO2 97%   BMI 37.49 kg/m  Physical Exam Vitals and nursing note reviewed.  Constitutional:      Appearance: She is well-developed.  HENT:     Head: Normocephalic and atraumatic.  Cardiovascular:     Rate and Rhythm: Normal rate.  Pulses:          Radial pulses are 2+ on the right side and 2+ on the left side.  Pulmonary:     Effort: Pulmonary effort is normal.     Breath sounds: Examination of the right-lower field reveals decreased breath sounds. Examination of the left-lower field reveals decreased breath sounds. Decreased breath sounds present.  Chest:     Chest wall: No tenderness.  Abdominal:     Palpations: Abdomen is soft.  Musculoskeletal:     Cervical back: Normal range of motion and neck supple.     Right lower leg: No edema.     Left lower leg: No edema.  Skin:    General: Skin is warm and dry.  Neurological:     Mental Status: She is alert and oriented to person, place, and time.     Sensory: Sensory deficit present.     Coordination: Finger-Nose-Finger Test and Heel to [place] Test normal.     Comments: Alert, oriented, thought content appropriate. Speech is slurred. Able to follow 2 step commands without difficulty.  Cranial Nerves:  II:  Peripheral visual fields grossly normal, pupils, round, reactive to light III,IV, VI: ptosis not present, extra-ocular motions intact bilaterally  V,VII: smile symmetric, facial  light touch sensation different in left side of her face VIII: hearing grossly normal bilaterally  IX,X: midline uvula rise  XI: bilateral shoulder shrug equal and strong XII: midline tongue extension  Motor:  5/5 in upper and lower RIGHT extremities bilaterally including strong and equal grip strength and dorsiflexion/plantar flexion 4/5 upper an lower LEFT extremities bilaterally including grip and dorsiflexion/plantar flexion Sensory: light touch normal in all extremities except for lighter touch on left side of her extremities Cerebellar: normal finger-to-nose with bilateral upper extremities, pronator drift negative Gait: normal gait and balance      ED Results / Procedures / Treatments   Labs (all labs ordered are listed, but only abnormal results are displayed) Labs Reviewed  CBC WITH DIFFERENTIAL/PLATELET - Abnormal; Notable for the following components:      Result Value   RBC 5.45 (*)    All other components within normal limits  COMPREHENSIVE METABOLIC PANEL - Abnormal; Notable for the following components:   Albumin 3.4 (*)    AST 42 (*)    ALT 58 (*)    Alkaline Phosphatase 286 (*)    All other components within normal limits  TSH - Abnormal; Notable for the following components:   TSH <0.010 (*)    All other components within normal limits  SARS CORONAVIRUS 2 BY RT PCR  PROTIME-INR  URINALYSIS, ROUTINE W REFLEX MICROSCOPIC  T4, FREE  T3  CBG MONITORING, ED  TROPONIN I (HIGH SENSITIVITY)  TROPONIN I (HIGH SENSITIVITY)    EKG EKG Interpretation  Date/Time:  Thursday Nov 15 2021 09:08:14 EDT Ventricular Rate:  86 PR Interval:  134 QRS Duration: 76 QT Interval:  394 QTC Calculation: 471 R Axis:   20 Text Interpretation: Normal sinus rhythm Low voltage QRS Borderline ECG When compared with ECG of 15-Nov-2021 09:07, PREVIOUS ECG IS PRESENT Confirmed by Nanda Quinton [phone]) on 11/15/2021 9:18:40 AM  Radiology CT ANGIO HEAD NECK W WO CM  Result Date:  11/15/2021 CLINICAL DATA:  Provided history: Neuro deficit, acute, stroke suspected. Additional history obtained from [place] arm numbness, headache, left-sided chest pain, shortness of breath, nausea. EXAM: CT ANGIOGRAPHY HEAD AND NECK TECHNIQUE: Multidetector CT imaging of the head and neck was  performed using the standard protocol during bolus administration of intravenous contrast. Multiplanar CT image reconstructions and MIPs were obtained to evaluate the vascular anatomy. Carotid stenosis measurements (when applicable) are obtained utilizing NASCET criteria, using the distal internal carotid diameter as the denominator. RADIATION DOSE REDUCTION: This exam was performed according to the departmental dose-optimization program which includes automated exposure control, adjustment of the mA and/or kV according to patient size and/or use of iterative reconstruction technique. CONTRAST:  77m OMNIPAQUE IOHEXOL 350 MG/ML SOLN COMPARISON:  Brain MRI 11/11/2020. Head CT 11/11/2020. MRA head 09/26/2017. FINDINGS: CT HEAD FINDINGS Brain: Cerebral volume is normal. Patchy and ill-defined hypoattenuation within the cerebral white matter, nonspecific but compatible with mild chronic small vessel ischemic disease. There is no acute intracranial hemorrhage. No demarcated cortical infarct. No extra-axial fluid collection. No evidence of an intracranial mass. No midline shift. Vascular: No hyperdense vessel. Skull: No fracture or aggressive osseous lesion. Sinuses: No significant paranasal sinus disease. Orbits: Bilateral proptosis. No orbital mass or acute orbital finding. Review of the MIP images confirms the above findings CTA NECK FINDINGS Aortic arch: The left vertebral artery arises directly from the aortic arch. Common origin of the innominate and left common carotid arteries. The visualized aortic arch is normal in caliber. Streak and beam hardening artifact arising from a dense right-sided  contrast bolus partially obscures the right subclavian artery. Within this limitation, there is no appreciable hemodynamically significant innominate or proximal subclavian artery stenosis. Right carotid system: CCA and ICA patent within the neck without stenosis or significant atherosclerotic disease. Left carotid system: CCA and ICA patent within the neck without stenosis or significant atherosclerotic disease. Vertebral arteries: The right vertebral artery is dominant. Vertebral arteries patent within the neck. Moderate V2 right vertebral artery at C4-C5 level due to mass effect from degenerative bony spurring. Otherwise, there is no significant stenosis within the cervical vertebral arteries. Skeleton: Cervical spondylosis with multilevel disc space narrowing, disc bulges/central disc protrusions, endplate spurring and uncovertebral hypertrophy. Disc space narrowing is advanced at C4-C5, C5-C6 and C6-C7. Multilevel spinal canal stenosis. Most notably, spinal canal stenosis appears moderate/severe at C4-C5 and severe at C5-C6. Multilevel bony neural foraminal narrowing. Other neck: No neck mass or cervical lymphadenopathy. Diminutive thyroid gland. Upper chest: No consolidation within the imaged lung apices. Review of the MIP images confirms the above findings CTA HEAD FINDINGS Anterior circulation: The intracranial vertebral arteries are patent.Nonstenotic atherosclerotic plaque within both vessels. The M1 middle cerebral arteries are patent. No M2 proximal branch occlusion or high-grade proximal stenosis. The anterior cerebral arteries are patent. No intracranial aneurysm is identified. Posterior circulation: The intracranial vertebral arteries are patent. The basilar artery is patent. The posterior cerebral arteries are patent. Posterior communicating arteries are present bilaterally. Venous sinuses: Within the limitations of contrast timing, no convincing thrombus. Anatomic variants: None significant. Review  of the MIP images confirms the above findings IMPRESSION: CT head: 1. No evidence of acute intracranial abnormality. 2. Mild chronic small vessel ischemic changes within the cerebral white matter. 3. Bilateral proptosis. CTA neck: 1. The common carotid and internal carotid arteries are patent within the neck without stenosis or significant atherosclerotic disease. 2. Vertebral arteries patent within the neck. Moderate focal narrowing of the V2 right vertebral artery due to mass effect from degenerative bony spurring. 3. Cervical spondylosis, as described. Spinal canal stenosis is greatest at C4-C5 (at least moderate) and C5-C6 (severe). Multilevel bony neural foraminal narrowing. CTA head: 1. No intracranial large vessel occlusion or proximal high-grade arterial stenosis.  2. Nonstenotic calcified plaque within the intracranial ICAs, bilaterally. Electronically Signed   By: Kellie Simmering D.O.   On: 11/15/2021 11:32   CT HEAD WO CONTRAST (5MM)  Result Date: 11/15/2021 CLINICAL DATA:  Provided history: Neuro deficit, acute, stroke suspected. Additional history obtained from [place] arm numbness, headache, left-sided chest pain, shortness of breath, nausea. EXAM: CT ANGIOGRAPHY HEAD AND NECK TECHNIQUE: Multidetector CT imaging of the head and neck was performed using the standard protocol during bolus administration of intravenous contrast. Multiplanar CT image reconstructions and MIPs were obtained to evaluate the vascular anatomy. Carotid stenosis measurements (when applicable) are obtained utilizing NASCET criteria, using the distal internal carotid diameter as the denominator. RADIATION DOSE REDUCTION: This exam was performed according to the departmental dose-optimization program which includes automated exposure control, adjustment of the mA and/or kV according to patient size and/or use of iterative reconstruction technique. CONTRAST:  17m OMNIPAQUE IOHEXOL 350 MG/ML SOLN COMPARISON:   Brain MRI 11/11/2020. Head CT 11/11/2020. MRA head 09/26/2017. FINDINGS: CT HEAD FINDINGS Brain: Cerebral volume is normal. Patchy and ill-defined hypoattenuation within the cerebral white matter, nonspecific but compatible with mild chronic small vessel ischemic disease. There is no acute intracranial hemorrhage. No demarcated cortical infarct. No extra-axial fluid collection. No evidence of an intracranial mass. No midline shift. Vascular: No hyperdense vessel. Skull: No fracture or aggressive osseous lesion. Sinuses: No significant paranasal sinus disease. Orbits: Bilateral proptosis. No orbital mass or acute orbital finding. Review of the MIP images confirms the above findings CTA NECK FINDINGS Aortic arch: The left vertebral artery arises directly from the aortic arch. Common origin of the innominate and left common carotid arteries. The visualized aortic arch is normal in caliber. Streak and beam hardening artifact arising from a dense right-sided contrast bolus partially obscures the right subclavian artery. Within this limitation, there is no appreciable hemodynamically significant innominate or proximal subclavian artery stenosis. Right carotid system: CCA and ICA patent within the neck without stenosis or significant atherosclerotic disease. Left carotid system: CCA and ICA patent within the neck without stenosis or significant atherosclerotic disease. Vertebral arteries: The right vertebral artery is dominant. Vertebral arteries patent within the neck. Moderate V2 right vertebral artery at C4-C5 level due to mass effect from degenerative bony spurring. Otherwise, there is no significant stenosis within the cervical vertebral arteries. Skeleton: Cervical spondylosis with multilevel disc space narrowing, disc bulges/central disc protrusions, endplate spurring and uncovertebral hypertrophy. Disc space narrowing is advanced at C4-C5, C5-C6 and C6-C7. Multilevel spinal canal stenosis. Most notably, spinal  canal stenosis appears moderate/severe at C4-C5 and severe at C5-C6. Multilevel bony neural foraminal narrowing. Other neck: No neck mass or cervical lymphadenopathy. Diminutive thyroid gland. Upper chest: No consolidation within the imaged lung apices. Review of the MIP images confirms the above findings CTA HEAD FINDINGS Anterior circulation: The intracranial vertebral arteries are patent.Nonstenotic atherosclerotic plaque within both vessels. The M1 middle cerebral arteries are patent. No M2 proximal branch occlusion or high-grade proximal stenosis. The anterior cerebral arteries are patent. No intracranial aneurysm is identified. Posterior circulation: The intracranial vertebral arteries are patent. The basilar artery is patent. The posterior cerebral arteries are patent. Posterior communicating arteries are present bilaterally. Venous sinuses: Within the limitations of contrast timing, no convincing thrombus. Anatomic variants: None significant. Review of the MIP images confirms the above findings IMPRESSION: CT head: 1. No evidence of acute intracranial abnormality. 2. Mild chronic small vessel ischemic changes within the cerebral white matter. 3. Bilateral proptosis. CTA neck: 1.  The common carotid and internal carotid arteries are patent within the neck without stenosis or significant atherosclerotic disease. 2. Vertebral arteries patent within the neck. Moderate focal narrowing of the V2 right vertebral artery due to mass effect from degenerative bony spurring. 3. Cervical spondylosis, as described. Spinal canal stenosis is greatest at C4-C5 (at least moderate) and C5-C6 (severe). Multilevel bony neural foraminal narrowing. CTA head: 1. No intracranial large vessel occlusion or proximal high-grade arterial stenosis. 2. Nonstenotic calcified plaque within the intracranial ICAs, bilaterally. Electronically Signed   By: Kellie Simmering D.O.   On: 11/15/2021 11:32    Procedures Procedures    Medications  Ordered in ED Medications  iohexol (OMNIPAQUE) 350 MG/ML injection 80 mL (80 mLs Intravenous Contrast Given 11/15/21 1104)    ED Course/ Medical Decision Making/ A&P                           Medical Decision Making Amount and/or Complexity of Data Reviewed Labs: ordered. Radiology: ordered.  Risk Prescription drug management.  This patient presents to the ED for concern of slurred speech, chest pain, this involves a number of treatment options, and is a complaint that carries with it a high risk of complications and morbidity.  The differential diagnosis includes ACS, CVA, metabolic derangement, infection.   Co morbidities: Discussed in HPI   Brief History:  Patient presents to the ED with a chief complaint of slurred speech, left arm weakness for the past ongoing days.  Patient does have a prior history of TIA x2, not anticoagulated.  Here with worsening left arm weakness, last known normal last night around 1 AM.  No alleviating or exacerbating factors, did not try to take any medication for improvement in symptoms.  No recent infections.  EMR reviewed including pt PMHx, past surgical history and past visits to ER.   See HPI for more details   Lab Tests:  I ordered and independently interpreted labs.  The pertinent results include:    I personally reviewed all laboratory work and imaging. Metabolic panel without any acute abnormality specifically kidney function within normal limits and no significant electrolyte abnormalities. CBC without leukocytosis or significant anemia.   Imaging Studies:    CT head:     1. No evidence of acute intracranial abnormality.  2. Mild chronic small vessel ischemic changes within the cerebral  white matter.  3. Bilateral proptosis.     CTA neck:     1. The common carotid and internal carotid arteries are patent  within the neck without stenosis or significant atherosclerotic  disease.  2. Vertebral arteries patent within the  neck. Moderate focal  narrowing of the V2 right vertebral artery due to mass effect from  degenerative bony spurring.  3. Cervical spondylosis, as described. Spinal canal stenosis is  greatest at C4-C5 (at least moderate) and C5-C6 (severe). Multilevel  bony neural foraminal narrowing.     CTA head:     1. No intracranial large vessel occlusion or proximal high-grade  arterial stenosis.  2. Nonstenotic calcified plaque within the intracranial ICAs,  bilaterally.      Cardiac Monitoring:  The patient was maintained on a cardiac monitor.  I personally viewed and interpreted the cardiac monitored which showed an underlying rhythm of: NSR  EKG non-ischemic  Consults:  I requested consultation with Dr. Theda Sers,  and discussed lab and imaging findings as well as pertinent plan - they recommend: admission for TIA workup although  symptoms have not resolved.     Reevaluation:  After the interventions noted above I re-evaluated patient and found that they have :stayed the same   Social Determinants of Health:  The patient's social determinants of health were a factor in the care of this patient    Problem List / ED Course:  Patient here with slurred speech, left arm weakness which worsen this morning.  Last known normal yesterday approximately 1 AM.  Negative work-up in the ED, no way blood cell count elevation, no electrolyte derangement.  TSH noted to be low, patient with underlying Graves' disease.  First troponin is negative, I do not SPECT ACS at this time as patient endorses the chest pain as a "annoying "symptom but continues to endorse a left arm heaviness.  We will still obtain delta Trope at this time.  No work-up to suggest infection.  CT head, CT angio head and neck are within normal limits.  I did discuss case with neurology who felt patient would need admission for further management of her acute symptoms for the past 3 days.  Patient is hemodynamically stable.  Will call  hospitalist for admission   Dispostion:  After consideration of the diagnostic results and the patients response to treatment, I feel that the patent would benefit from admission for further management of her left arm weakness along with slurred speech.   1:52 PM spoke to Dr. Lorin [facility] hospitalist service, appreciate her assistance.  She will admit the patient for further management.    Portions of this note were generated with [employer]. Dictation errors may occur despite best attempts at proofreading.   Final Clinical Impression(s) / ED Diagnoses Final diagnoses:  Slurred speech  Left arm weakness    Rx / DC Orders ED Discharge Orders     None         Janeece Fitting, PA-C 11/15/21 1352    Long, W[facility], MD 11/18/21 1306

## 2021-11-15 NOTE — ED Notes (Signed)
Patient transported to CT 

## 2021-11-15 NOTE — ED Triage Notes (Addendum)
Pt arrived POV from home c/o left arm numbness a headache and slight left sided chest pain that radiates down your arm. Pt also endorses some shortness of breath and nausea. Pt's LKW was 3 days ago.

## 2021-11-15 NOTE — Telephone Encounter (Signed)
Patient called to let jaffe know she is in ER due to stroke symptoms  again and they done a CT scan with contrast and is waiting on results

## 2021-11-15 NOTE — Consult Note (Signed)
Neurology Consultation  Reason for Consult: stroke like symptoms  Referring Physician: Dr. Lorin Mercy   CC: stroke like symptoms   History is obtained from:patient   HPI: Kendra Hall is a 56 y.o. female with past medical history of anxiety, depression; DM; TIA, fibromyalgia; and Graves disease, GERD, migraines, current smoker (5 cig/day) who presented to the ED for evaluation of stroke-like symptoms.She states that for the past week she has had problems with equilibrium and balance and for the last 2 days she has experienced slurred speech which she contributed to being tired and paid no attention to it. Yesterday she states she came home from work at [employer], went to sleep at 430 got back up around 18mand then went back to bed. She states that this morning around 7am she noticed tingling down her whole left arm to her fingertips. She endorses have some floaters in peripheral vision bilaterally for the last 2 days. She also states she had some chest discomfort that was bothering her so she called her job health line and they recommend she come to the hospital. She has a history of migraines and does have floaters with her migraines as well as photophobia, but has never had tingling. Currently only symptoms she has now is tingling in the left fingers, all else has resolved. She denies having a headache. She is eating food brought in by her family. She denies facial droop, slurred speech and weakness.   Premorbid modified Rankin scale (mRS):  0-Completely asymptomatic and back to baseline post-stroke  ROS: Full ROS was performed and is negative except as noted in the HPI.    Past Medical History:  Diagnosis Date   Allergy    Anemia    history of   Anxiety    Asthma    Depression    Diabetes mellitus    type 2    Fibromyalgia    Gastric ulcer    GERD (gastroesophageal reflux disease)    Graves disease    H/O therapeutic radiation    Migraine    Multiple thyroid nodules    Myofacial muscle  pain    Osteoarthritis    Facet hypertrophy with injections   TIA (transient ischemic attack)    02/2015    Hypertension Emergency (SBP > 180 or DBP > 120 & end organ damage), Type 2 diabetes mellitus with hyperglycemia , and Obesity   Family History  Problem Relation Age of Onset   Hypertension Mother    Diabetes Mother    Cancer Mother        rectal   Stroke Father    Heart disease Father 581      MI x5   Hypertension Father    Diabetes Father    Breast cancer Sister        unsure of age , was between 269and 320  Drug abuse Brother    Alcohol abuse Brother    Drug abuse Brother    Alcohol abuse Brother    Anxiety disorder Neg Hx    Bipolar disorder Neg Hx    Depression Neg Hx      Social History:   reports that she has been smoking cigarettes. She has a 20.50 pack-year smoking history. She has never used smokeless tobacco. She reports that she does not currently use drugs. She reports that she does not drink alcohol.  Medications  Current Facility-Administered Medications:     stroke: early stages of recovery book, , Does not  apply, Once, Karmen Bongo, MD   acetaminophen (TYLENOL) tablet 650 mg, 650 mg, Oral, Q4H PRN **OR** acetaminophen (TYLENOL) 160 MG/5ML solution 650 mg, 650 mg, Per Tube, Q4H PRN **OR** acetaminophen (TYLENOL) suppository 650 mg, 650 mg, Rectal, Q4H PRN, Karmen Bongo, MD   albuterol (VENTOLIN HFA) 108 (90 Base) MCG/ACT inhaler 2 puff, 2 puff, Inhalation, Q4H PRN, Karmen Bongo, MD   aspirin EC tablet 81 mg, 81 mg, Oral, Daily, Karmen Bongo, MD   atorvastatin (LIPITOR) tablet 20 mg, 20 mg, Oral, QPM, Karmen Bongo, MD   DULoxetine (CYMBALTA) DR capsule 60 mg, 60 mg, Oral, BID, Karmen Bongo, MD   enoxaparin (LOVENOX) injection 40 mg, 40 mg, Subcutaneous, Q24H, Karmen Bongo, MD   hydrOXYzine (VISTARIL) capsule 25 mg, 25 mg, Oral, TID PRN, Karmen Bongo, MD   insulin aspart (novoLOG) injection 0-15 Units, 0-15 Units, Subcutaneous,  TID WC, Karmen Bongo, MD   insulin aspart (novoLOG) injection 0-5 Units, 0-5 Units, Subcutaneous, QHS, Karmen Bongo, MD   Derrill Memo ON 11/16/2021] insulin glargine (2 Unit Dial) (TOUJEO MAX) Solostar Pen SOPN 110 Units, 110 Units, Subcutaneous, Ledell Noss, MD   lamoTRIgine (LAMICTAL) tablet 150 mg, 150 mg, Oral, QHS, Karmen Bongo, MD   Derrill Memo ON 11/16/2021] levothyroxine (SYNTHROID) tablet 224 mcg, 224 mcg, Oral, Daily, Karmen Bongo, MD   Derrill Memo ON 11/16/2021] linaclotide (LINZESS) capsule 290 mcg, 290 mcg, Oral, QAC breakfast, Karmen Bongo, MD   LORazepam (ATIVAN) injection 1 mg, 1 mg, Intravenous, Once PRN, Karmen Bongo, MD   mirtazapine (REMERON) tablet 7.5 mg, 7.5 mg, Oral, QHS, Karmen Bongo, MD   Derrill Memo ON 11/16/2021] montelukast (SINGULAIR) tablet 10 mg, 10 mg, Oral, Daily, Karmen Bongo, MD   nicotine (NICODERM CQ - dosed in mg/24 hours) patch 14 mg, 14 mg, Transdermal, Daily, Karmen Bongo, MD   Derrill Memo ON 11/16/2021] valACYclovir (VALTREX) tablet 1,000 mg, 1,000 mg, Oral, Daily, Karmen Bongo, MD  Current Outpatient Medications:    acetaminophen (TYLENOL) 500 MG tablet, Take 1,000 mg by mouth every 6 (six) hours as needed for moderate pain., Disp: , Rfl:    albuterol (PROAIR HFA) 108 (90 Base) MCG/ACT inhaler, Inhale 2 puffs into the lungs every 4 (four) hours as needed for wheezing., Disp: 1 Inhaler, Rfl: 2   albuterol (PROVENTIL) (2.5 MG/3ML) 0.083% nebulizer solution, Take 3 mLs by nebulization 4 (four) times daily as needed for wheezing or shortness of breath. , Disp: , Rfl: 3   amLODipine (NORVASC) 10 MG tablet, Take 10 mg by mouth daily., Disp: , Rfl:    atorvastatin (LIPITOR) 20 MG tablet, Take 20 mg by mouth every evening., Disp: , Rfl:    benzonatate (TESSALON) 200 MG capsule, Take 200 mg by mouth 3 (three) times daily as needed for cough., Disp: , Rfl:    DULoxetine (CYMBALTA) 60 MG capsule, Take 60 mg by mouth 2 (two) times daily., Disp: , Rfl:     EMGALITY 120 MG/ML SOAJ, ADMINISTER 1 ML UNDER THE SKIN EVERY 30 DAYS (Patient taking differently: Inject 1 mL into the skin every 30 (thirty) days.), Disp: 1 mL, Rfl: 1   famciclovir (FAMVIR) 500 MG tablet, Take 500 mg by mouth 2 (two) times daily., Disp: , Rfl:    hydrOXYzine (VISTARIL) 25 MG capsule, Take 25 mg by mouth 3 (three) times daily as needed for itching., Disp: , Rfl:    insulin glargine, 2 Unit Dial, (TOUJEO MAX SOLOSTAR) 300 UNIT/ML Solostar Pen, Inject 110 Units into the skin every morning., Disp: 36 mL, Rfl: 3  lamoTRIgine (LAMICTAL) 150 MG tablet, Take 150 mg by mouth at bedtime., Disp: , Rfl:    levothyroxine (SYNTHROID) 112 MCG tablet, Take 2 tablets (224 mcg total) by mouth daily., Disp: 180 tablet, Rfl: 1   linaclotide (LINZESS) 290 MCG CAPS capsule, Take 290 mcg by mouth daily before breakfast. , Disp: , Rfl:    Magnesium 250 MG TABS, Take 750 mg by mouth at bedtime., Disp: , Rfl:    mirtazapine (REMERON) 7.5 MG tablet, Take 7.5 mg by mouth at bedtime., Disp: , Rfl:    montelukast (SINGULAIR) 10 MG tablet, Take 10 mg by mouth daily. , Disp: , Rfl: 3   promethazine (PHENERGAN) 12.5 MG tablet, Take 12.5 mg by mouth every 6 (six) hours as needed for nausea or vomiting., Disp: , Rfl:    rizatriptan (MAXALT-MLT) 10 MG disintegrating tablet, DISSOLVE 1 TABLET ON THE TONGUE AT ONSET OF HEADACHE. MAY REPEAT 1 TIME IN 2 HOURS AS NEEDED (Patient taking differently: Take 10 mg by mouth as needed for migraine. May repeat 1 time in 2 hours), Disp: 9 tablet, Rfl: 3   aspirin EC 81 MG tablet, Take 81 mg by mouth daily. Swallow whole. (Patient not taking: Reported on 11/15/2021), Disp: , Rfl:    glucose blood (ONETOUCH VERIO) test strip, CHECK YOUR BLOOD SUGAR ONCE DAILY(VARY THE TIME OF DAY WHEN YOU CHECK BETWEEN, BEFORE THE 3 MEALS, AND AT BEDTIME), Disp: 200 strip, Rfl: 1   Insulin Pen Needle (BD PEN NEEDLE NANO 2ND GEN) 32G X 4 MM MISC, USE TO INJECT LANTUS UNDER THE SKIN EVERY MORNING,  Disp: 100 each, Rfl: 3   Exam: Current vital signs: BP (!) 154/69   Pulse 86   Temp 97.9 F (36.6 C) (Oral)   Resp 17   Ht 5' 3.5" (1.613 m)   Wt 97.5 kg   LMP 12/06/2016 (Approximate)   SpO2 98%   BMI 37.49 kg/m  Vital signs in last 24 hours: Temp:  [97.9 F (36.6 C)] 97.9 F (36.6 C) (05/25 0907) Pulse Rate:  [79-91] 86 (05/25 1545) Resp:  [13-25] 17 (05/25 1545) BP: (119-154)/(55-88) 154/69 (05/25 1545) SpO2:  [92 %-100 %] 98 % (05/25 1545) Weight:  [97.5 kg] 97.5 kg (05/25 0911)  GENERAL: Awake, alert in NAD HEENT: - Normocephalic and atraumatic, dry mm LUNGS - Clear to auscultation bilaterally with no wheezes CV - S1S2 RRR, no m/r/g, equal pulses bilaterally. ABDOMEN - Soft, nontender, nondistended with normoactive BS Ext: warm, well perfused, intact peripheral pulses, no edema  NEURO:  Mental Status: AA&Ox4 Language: speech is clear Naming, repetition, fluency, and comprehension intact. Cranial Nerves: PERRL 2 mm/brisk. EOMI, visual fields full, no facial asymmetry, facial sensation intact, hearing intact, tongue/uvula/soft palate midline, normal sternocleidomastoid and trapezius muscle strength. No evidence of tongue atrophy or fibrillations Motor: 5/5 in all 4 extremities  Tone: is normal and bulk is normal Sensation- decreased sensation on left  Coordination: FTN intact bilaterally, no ataxia in BLE. Gait- deferred  NIHSS 1a Level of Conscious.: 0 1b LOC Questions: 0 1c LOC Commands: 0 2 Best Gaze: 0 3 Visual: 0 4 Facial Palsy: 0 5a Motor Arm - left: 0 5b Motor Arm - Right: 0 6a Motor Leg - Left: 0 6b Motor Leg - Right: 0 7 Limb Ataxia: 0 8 Sensory: 0 9 Best Language: 0 10 Dysarthria: 0 11 Extinct. and Inatten.: 0 TOTAL: 0    Imaging I have reviewed the images obtained:  CT-head 1. No evidence of acute intracranial abnormality.  2. Mild chronic small vessel ischemic changes within the cerebral white matter. 3. Bilateral proptosis  CTA  head 1. No intracranial large vessel occlusion or proximal high-grade arterial stenosis. 2. Nonstenotic calcified plaque within the intracranial ICAs, bilaterally.  CTA neck  1. The common carotid and internal carotid arteries are patent within the neck without stenosis or significant atherosclerotic disease. 2. Vertebral arteries patent within the neck. Moderate focal narrowing of the V2 right vertebral artery due to mass effect from degenerative bony spurring. 3. Cervical spondylosis, as described. Spinal canal stenosis is greatest at C4-C5 (at least moderate) and C5-C6 (severe). Multilevel bony neural foraminal narrowing.  Assessment:  Kendra Hall is a 56 y.o. female with past medical history of anxiety, depression; DM; TIA, fibromyalgia; and Graves disease, GERD, migraines, current smoker (5 cig/day) who presented to the ED for evaluation of stroke-like symptoms.She states that for the past week she has had problems with equilibrium and balance and for the last 2 days she has experienced slurred speech which she contributed to being tired and paid no attention to it. Yesterday she states she came home from work at [employer], went to sleep at 430 got back up around 59mand then went back to bed. She states that this morning around 7am she noticed tingling down her whole left arm to her fingertips. She endorses have some floaters in peripheral vision bilaterally for the last 2 days  Stroke like symptoms possible CVA or TIA   Recommendations: - HgbA1c, fasting lipid panel - MRI of the brain without contrast - Frequent neuro checks - Echocardiogram - Prophylactic therapy-Antiplatelet med: Aspirin - dose '81mg'$   - Risk factor modification - Telemetry monitoring - PT consult, OT consult, Speech consult - Stroke team to follow   DBeulah GandyDNP, ACNPC-AG

## 2021-11-16 ENCOUNTER — Observation Stay (HOSPITAL_COMMUNITY): Payer: 59

## 2021-11-16 DIAGNOSIS — R2990 Unspecified symptoms and signs involving the nervous system: Secondary | ICD-10-CM | POA: Diagnosis not present

## 2021-11-16 LAB — HIV ANTIBODY (ROUTINE TESTING W REFLEX): HIV Screen 4th Generation wRfx: NONREACTIVE

## 2021-11-16 LAB — LIPID PANEL
Cholesterol: 102 mg/dL (ref 0–200)
HDL: 34 mg/dL — ABNORMAL LOW (ref >40)
LDL Cholesterol: 28 mg/dL (ref 0–99)
Total CHOL/HDL Ratio: 3 RATIO
Triglycerides: 199 mg/dL — ABNORMAL HIGH (ref <150)
VLDL: 40 mg/dL (ref 0–40)

## 2021-11-16 LAB — GLUCOSE, CAPILLARY
Glucose-Capillary: 327 mg/dL — ABNORMAL HIGH (ref 70–99)
Glucose-Capillary: 268 mg/dL — ABNORMAL HIGH (ref 70–99)
Glucose-Capillary: 140 mg/dL — ABNORMAL HIGH (ref 70–99)
Glucose-Capillary: 121 mg/dL — ABNORMAL HIGH (ref 70–99)
Glucose-Capillary: 136 mg/dL — ABNORMAL HIGH (ref 70–99)

## 2021-11-16 LAB — TSH: TSH: 0.014 u[IU]/mL — ABNORMAL LOW (ref 0.350–4.500)

## 2021-11-16 LAB — T3: T3, Total: 151 ng/dL (ref 71–180)

## 2021-11-16 LAB — T4, FREE: Free T4: 1.61 ng/dL — ABNORMAL HIGH (ref 0.61–1.12)

## 2021-11-16 MED ORDER — INSULIN GLARGINE-YFGN 100 UNIT/ML ~~LOC~~ SOLN
50.0000 [IU] | Freq: Every day | SUBCUTANEOUS | Status: DC
  Administered 2021-11-16 – 2021-11-17 (×2): 50 [IU] via SUBCUTANEOUS
  Filled 2021-11-16 (×2): qty 0.5

## 2021-11-16 MED ORDER — INSULIN ASPART 100 UNIT/ML IJ SOLN
0.0000 [IU] | Freq: Three times a day (TID) | INTRAMUSCULAR | Status: DC
  Administered 2021-11-16: 2 [IU] via SUBCUTANEOUS
  Administered 2021-11-17: 11 [IU] via SUBCUTANEOUS

## 2021-11-16 MED ORDER — LORAZEPAM 2 MG/ML IJ SOLN: 1.0000 mg | Freq: Once | INTRAMUSCULAR | Status: DC | PRN

## 2021-11-16 MED ORDER — INSULIN ASPART 100 UNIT/ML IJ SOLN
3.0000 [IU] | Freq: Three times a day (TID) | INTRAMUSCULAR | Status: DC
  Administered 2021-11-16: 3 [IU] via SUBCUTANEOUS

## 2021-11-16 MED ORDER — INSULIN ASPART 100 UNIT/ML IJ SOLN: 0.0000 [IU] | Freq: Every day | INTRAMUSCULAR | Status: DC

## 2021-11-16 MED ORDER — PROCHLORPERAZINE EDISYLATE 10 MG/2ML IJ SOLN
10.0000 mg | Freq: Once | INTRAMUSCULAR | Status: AC
  Administered 2021-11-16: 10 mg via INTRAVENOUS
  Filled 2021-11-16: qty 2

## 2021-11-16 MED ORDER — DIPHENHYDRAMINE HCL 50 MG/ML IJ SOLN
25.0000 mg | Freq: Once | INTRAMUSCULAR | Status: AC
  Administered 2021-11-16: 25 mg via INTRAVENOUS
  Filled 2021-11-16: qty 1

## 2021-11-16 MED ORDER — INSULIN ASPART 100 UNIT/ML IJ SOLN
0.0000 [IU] | Freq: Three times a day (TID) | INTRAMUSCULAR | Status: DC
  Administered 2021-11-16: 3 [IU] via SUBCUTANEOUS

## 2021-11-16 MED ORDER — MAGNESIUM GLUCONATE 500 MG PO TABS
750.0000 mg | ORAL_TABLET | Freq: Every day | ORAL | Status: DC
Start: 2021-11-16 — End: 2021-11-17
  Administered 2021-11-16: 750 mg via ORAL
  Filled 2021-11-16 (×3): qty 2

## 2021-11-16 NOTE — Progress Notes (Signed)
PROGRESS NOTE                                                                                                                                                                                                             Patient Demographics:    Kendra Hall, is a 56 y.o. female, DOB - 05-10-1966, UKG:[id number]  Outpatient Primary MD for the patient is Simona Huh, NP    LOS - 0  Admit date - 11/15/2021    Chief Complaint  Patient presents with   Chest Pain   Numbness       Brief Narrative (HPI from H&P)   56 y.o. female with medical history significant of depression; DM; TIA (2019); fibromyalgia; and Graves disease presenting with stroke-like symptoms. She had slurred speech for 2 days but didn't really pay attention, thought she was excessively tired.  She also had pain in her neck around the C-spine area radiating down to her left arm and left leg.  She has known history of C-spine stenosis.  She was admitted for further care.   Subjective:    Kendra Hall today has, No headache, No chest pain, No abdominal pain - No Nausea, No new weakness tingling or numbness, no shortness of breath, does have ongoing neck pain with left-sided tingling and numbness in both arms and legs with some weakness in the left leg.   Assessment  & Plan :    Stroke-like symptoms versus symptoms coming from C-spine stenosis.  MRI brain, CTA head and neck nonacute, echocardiogram stable, her main symptoms now seem to be C-spine pain and tingling numbness in her left arm and left leg.  Neurology already following the patient, [place] on aspirin and statin for secondary prevention, further TIA/stroke work-up per neurology team.  Neurosurgery also consulted.  Continue supportive care, PT OT and speech to follow as well.   HTN - blood pressure is soft, as needed hydralazine for now.  HLD -  Continue Lipitor, LDL under control.    Hypothyroidism  - H/O  Graves s/p ablation, TSH severely suppressed.  Hold Synthroid for now.  Resume at half home dose upon discharge with PCP to monitor  Mood d/o - Continue Cymbalta, Lamictal, hydroxyzine, mirtazepine   OSA  - Continue CPAP   Tobacco dependence - Encourage cessation. Patch ordered at patient request.  Class 2 Obesity - Body mass index is 37.49 kg/m.  BCP for weight loss.  DM 2 - in poor outpatient control due to hyperglycemia with elevated A1c recently.  Diabetic and insulin education, continue high-dose Lantus along with sliding scale.  Monitor and adjust.     Lab Results  Component Value Date   HGBA1C 11.0 (A) 10/18/2021   CBG (last 3)  Recent Labs    11/15/21 2204 11/16/21 0715 11/16/21 0827  GLUCAP 100* 327* 20*        Condition - Fair  Family Communication  :   Daughter  Alveria Apley [phone]  on 11/16/2021 10:45 AM - updated  Called daughter Demetrius Charity [phone] on 11/16/2021 10:46 AM - message left   Code Status :  Full  Consults  :  Neuro, N Surg  PUD Prophylaxis :    Procedures  :     MRI Brain - 1. Motion degraded examination without evidence of acute intracranial abnormality. 2. Grossly unchanged age advanced white matter disease, nonspecific but favored to reflect chronic small vessel ischemia given vascular risk factors.  CTA Head and Neck -   CT head: 1. No evidence of acute intracranial abnormality. 2. Mild chronic small vessel ischemic changes within the cerebral white matter. 3. Bilateral proptosis.   CTA neck: 1. The common carotid and internal carotid arteries are patent within the neck without stenosis or significant atherosclerotic disease. 2. Vertebral arteries patent within the neck. Moderate focal narrowing of the V2 right vertebral artery due to mass effect from degenerative bony spurring. 3. Cervical spondylosis, as described. Spinal canal stenosis is greatest at C4-C5 (at least moderate) and C5-C6 (severe). Multilevel bony neural foraminal  narrowing.   CTA head: 1. No intracranial large vessel occlusion or proximal high-grade arterial stenosis. 2. Nonstenotic calcified plaque within the intracranial ICAs, bilaterally.   TTE - 1. Left ventricular ejection fraction, by estimation, is 70 to 75%. Left ventricular ejection fraction by PLAX is 74 %. The left ventricle has hyperdynamic function. The left ventricle has no regional wall motion abnormalities. There is mild left ventricular hypertrophy. Left ventricular diastolic parameters are consistent with Grade I diastolic dysfunction (impaired relaxation).  2. Right ventricular systolic function is normal. The right ventricular size is normal. Tricuspid regurgitation signal is inadequate for assessing PA pressure.  3. The mitral valve is abnormal. Trivial mitral valve regurgitation.  4. The aortic valve is tricuspid. Aortic valve regurgitation is not visualized.  5. The inferior vena cava is normal in size with <50% respiratory variability, suggesting right atrial pressure of 8 mmHg      Disposition Plan  :    Status is: Observation  DVT Prophylaxis  :    enoxaparin (LOVENOX) injection 40 mg Start: 11/15/21 1530    Lab Results  Component Value Date   PLT 306 11/15/2021    Diet :  Diet Order             Diet heart healthy/carb modified Room service appropriate? Yes; Fluid consistency: Thin  Diet effective ____                    Inpatient Medications  Scheduled Meds:   stroke: early stages of recovery book   Does not apply Once   aspirin EC  81 mg Oral Daily   atorvastatin  20 mg Oral QPM   DULoxetine  60 mg Oral BID   enoxaparin (LOVENOX) injection  40 mg Subcutaneous Q24H   famciclovir  500 mg Oral  Q12H   insulin aspart  0-15 Units Subcutaneous TID WC   insulin aspart  0-5 Units Subcutaneous QHS   insulin glargine-yfgn  110 Units Subcutaneous BH-q7a   lamoTRIgine  150 mg Oral QHS   linaclotide  290 mcg Oral QAC breakfast   mirtazapine  7.5 mg Oral QHS    montelukast  10 mg Oral Daily   nicotine  14 mg Transdermal Daily   Continuous Infusions: PRN Meds:.acetaminophen **OR** acetaminophen (TYLENOL) oral liquid 160 mg/5 mL **OR** acetaminophen, albuterol, hydrOXYzine  Antibiotics  :    Anti-infectives (From admission, onward)    Start     Dose/Rate Route Frequency Ordered Stop   11/16/21 1000  valACYclovir (VALTREX) tablet 1,000 mg  Status:  Discontinued        1,000 mg Oral Daily 11/15/21 1527 11/15/21 2231   11/15/21 2330  famciclovir (FAMVIR) tablet 500 mg        500 mg Oral Every 12 hours 11/15/21 2231          Time Spent in minutes  30   Lala Lund M.D on 11/16/2021 at 10:43 AM  To page go to [email or web address]   [facility] -  Office  [phone]  See all Orders from today for further details    Objective:   Vitals:   11/15/21 1656 11/15/21 2005 11/15/21 2352 11/16/21 0824  BP: (!) 170/83 135/70  (!) 110/46  Pulse: 90 94 98   Resp: '19 18 17   '$ Temp: 97.6 F (36.4 C) 98.3 F (36.8 C)  98.3 F (36.8 C)  TempSrc:  Oral  Oral  SpO2: 97% 100% 100%   Weight:      Height:        Wt Readings from Last 3 Encounters:  11/15/21 97.5 kg  10/18/21 97.2 kg  08/09/21 95.3 kg    No intake or output data in the 24 hours ending 11/16/21 1043   Physical Exam  Awake Alert, No new F.N deficits, Normal affect [place].AT,PERRAL Supple Neck, No JVD,   Symmetrical Chest wall movement, Good air movement bilaterally, CTAB RRR,No Gallops,Rubs or new Murmurs,  +ve B.Sounds, Abd Soft, No tenderness,   No Cyanosis, Clubbing or edema         Data Review:    CBC Recent Labs  Lab 11/15/21 0925  WBC 7.5  HGB 14.8  HCT 45.0  PLT 306  MCV 82.6  MCH 27.2  MCHC 32.9  RDW 14.4  LYMPHSABS 2.2  MONOABS 0.6  EOSABS 0.1  BASOSABS 0.0    Electrolytes Recent Labs  Lab 11/15/21 0925 11/15/21 0950  NA 141  --   K 4.0  --   CL 105  --   CO2 27  --   GLUCOSE 92  --   BUN 11  --   CREATININE 0.81  --   CALCIUM  9.2  --   AST 42*  --   ALT 58*  --   ALKPHOS 286*  --   BILITOT 0.7  --   ALBUMIN 3.4*  --   INR 1.0  --   TSH  --  <0.010*    ------------------------------------------------------------------------------------------------------------------ Recent Labs    11/16/21 0202  CHOL 102  HDL 34*  LDLCALC 28  TRIG 199*  CHOLHDL 3.0    Lab Results  Component Value Date   HGBA1C 11.0 (A) 10/18/2021    Recent Labs    11/15/21 0950  TSH <0.010*   ------------------------------------------------------------------------------------------------------------------ ID Labs Recent Labs  Lab 11/15/21 [phone]  WBC 7.5  PLT 306  CREATININE 0.81      Radiology Reports CT ANGIO HEAD NECK W WO CM  Result Date: 11/15/2021 CLINICAL DATA:  Provided history: Neuro deficit, acute, stroke suspected. Additional history obtained from [place] arm numbness, headache, left-sided chest pain, shortness of breath, nausea. EXAM: CT ANGIOGRAPHY HEAD AND NECK TECHNIQUE: Multidetector CT imaging of the head and neck was performed using the standard protocol during bolus administration of intravenous contrast. Multiplanar CT image reconstructions and MIPs were obtained to evaluate the vascular anatomy. Carotid stenosis measurements (when applicable) are obtained utilizing NASCET criteria, using the distal internal carotid diameter as the denominator. RADIATION DOSE REDUCTION: This exam was performed according to the departmental dose-optimization program which includes automated exposure control, adjustment of the mA and/or kV according to patient size and/or use of iterative reconstruction technique. CONTRAST:  71m OMNIPAQUE IOHEXOL 350 MG/ML SOLN COMPARISON:  Brain MRI 11/11/2020. Head CT 11/11/2020. MRA head 09/26/2017. FINDINGS: CT HEAD FINDINGS Brain: Cerebral volume is normal. Patchy and ill-defined hypoattenuation within the cerebral white matter, nonspecific but compatible with mild  chronic small vessel ischemic disease. There is no acute intracranial hemorrhage. No demarcated cortical infarct. No extra-axial fluid collection. No evidence of an intracranial mass. No midline shift. Vascular: No hyperdense vessel. Skull: No fracture or aggressive osseous lesion. Sinuses: No significant paranasal sinus disease. Orbits: Bilateral proptosis. No orbital mass or acute orbital finding. Review of the MIP images confirms the above findings CTA NECK FINDINGS Aortic arch: The left vertebral artery arises directly from the aortic arch. Common origin of the innominate and left common carotid arteries. The visualized aortic arch is normal in caliber. Streak and beam hardening artifact arising from a dense right-sided contrast bolus partially obscures the right subclavian artery. Within this limitation, there is no appreciable hemodynamically significant innominate or proximal subclavian artery stenosis. Right carotid system: CCA and ICA patent within the neck without stenosis or significant atherosclerotic disease. Left carotid system: CCA and ICA patent within the neck without stenosis or significant atherosclerotic disease. Vertebral arteries: The right vertebral artery is dominant. Vertebral arteries patent within the neck. Moderate V2 right vertebral artery at C4-C5 level due to mass effect from degenerative bony spurring. Otherwise, there is no significant stenosis within the cervical vertebral arteries. Skeleton: Cervical spondylosis with multilevel disc space narrowing, disc bulges/central disc protrusions, endplate spurring and uncovertebral hypertrophy. Disc space narrowing is advanced at C4-C5, C5-C6 and C6-C7. Multilevel spinal canal stenosis. Most notably, spinal canal stenosis appears moderate/severe at C4-C5 and severe at C5-C6. Multilevel bony neural foraminal narrowing. Other neck: No neck mass or cervical lymphadenopathy. Diminutive thyroid gland. Upper chest: No consolidation within the  imaged lung apices. Review of the MIP images confirms the above findings CTA HEAD FINDINGS Anterior circulation: The intracranial vertebral arteries are patent.Nonstenotic atherosclerotic plaque within both vessels. The M1 middle cerebral arteries are patent. No M2 proximal branch occlusion or high-grade proximal stenosis. The anterior cerebral arteries are patent. No intracranial aneurysm is identified. Posterior circulation: The intracranial vertebral arteries are patent. The basilar artery is patent. The posterior cerebral arteries are patent. Posterior communicating arteries are present bilaterally. Venous sinuses: Within the limitations of contrast timing, no convincing thrombus. Anatomic variants: None significant. Review of the MIP images confirms the above findings IMPRESSION: CT head: 1. No evidence of acute intracranial abnormality. 2. Mild chronic small vessel ischemic changes within the cerebral white matter. 3. Bilateral proptosis. CTA neck: 1. The common carotid and internal carotid arteries are  patent within the neck without stenosis or significant atherosclerotic disease. 2. Vertebral arteries patent within the neck. Moderate focal narrowing of the V2 right vertebral artery due to mass effect from degenerative bony spurring. 3. Cervical spondylosis, as described. Spinal canal stenosis is greatest at C4-C5 (at least moderate) and C5-C6 (severe). Multilevel bony neural foraminal narrowing. CTA head: 1. No intracranial large vessel occlusion or proximal high-grade arterial stenosis. 2. Nonstenotic calcified plaque within the intracranial ICAs, bilaterally. Electronically Signed   By: Kellie Simmering D.O.   On: 11/15/2021 11:32   CT HEAD WO CONTRAST (5MM)  Result Date: 11/15/2021 CLINICAL DATA:  Provided history: Neuro deficit, acute, stroke suspected. Additional history obtained from [place] arm numbness, headache, left-sided chest pain, shortness of breath, nausea. EXAM: CT  ANGIOGRAPHY HEAD AND NECK TECHNIQUE: Multidetector CT imaging of the head and neck was performed using the standard protocol during bolus administration of intravenous contrast. Multiplanar CT image reconstructions and MIPs were obtained to evaluate the vascular anatomy. Carotid stenosis measurements (when applicable) are obtained utilizing NASCET criteria, using the distal internal carotid diameter as the denominator. RADIATION DOSE REDUCTION: This exam was performed according to the departmental dose-optimization program which includes automated exposure control, adjustment of the mA and/or kV according to patient size and/or use of iterative reconstruction technique. CONTRAST:  66m OMNIPAQUE IOHEXOL 350 MG/ML SOLN COMPARISON:  Brain MRI 11/11/2020. Head CT 11/11/2020. MRA head 09/26/2017. FINDINGS: CT HEAD FINDINGS Brain: Cerebral volume is normal. Patchy and ill-defined hypoattenuation within the cerebral white matter, nonspecific but compatible with mild chronic small vessel ischemic disease. There is no acute intracranial hemorrhage. No demarcated cortical infarct. No extra-axial fluid collection. No evidence of an intracranial mass. No midline shift. Vascular: No hyperdense vessel. Skull: No fracture or aggressive osseous lesion. Sinuses: No significant paranasal sinus disease. Orbits: Bilateral proptosis. No orbital mass or acute orbital finding. Review of the MIP images confirms the above findings CTA NECK FINDINGS Aortic arch: The left vertebral artery arises directly from the aortic arch. Common origin of the innominate and left common carotid arteries. The visualized aortic arch is normal in caliber. Streak and beam hardening artifact arising from a dense right-sided contrast bolus partially obscures the right subclavian artery. Within this limitation, there is no appreciable hemodynamically significant innominate or proximal subclavian artery stenosis. Right carotid system: CCA and ICA patent within  the neck without stenosis or significant atherosclerotic disease. Left carotid system: CCA and ICA patent within the neck without stenosis or significant atherosclerotic disease. Vertebral arteries: The right vertebral artery is dominant. Vertebral arteries patent within the neck. Moderate V2 right vertebral artery at C4-C5 level due to mass effect from degenerative bony spurring. Otherwise, there is no significant stenosis within the cervical vertebral arteries. Skeleton: Cervical spondylosis with multilevel disc space narrowing, disc bulges/central disc protrusions, endplate spurring and uncovertebral hypertrophy. Disc space narrowing is advanced at C4-C5, C5-C6 and C6-C7. Multilevel spinal canal stenosis. Most notably, spinal canal stenosis appears moderate/severe at C4-C5 and severe at C5-C6. Multilevel bony neural foraminal narrowing. Other neck: No neck mass or cervical lymphadenopathy. Diminutive thyroid gland. Upper chest: No consolidation within the imaged lung apices. Review of the MIP images confirms the above findings CTA HEAD FINDINGS Anterior circulation: The intracranial vertebral arteries are patent.Nonstenotic atherosclerotic plaque within both vessels. The M1 middle cerebral arteries are patent. No M2 proximal branch occlusion or high-grade proximal stenosis. The anterior cerebral arteries are patent. No intracranial aneurysm is identified. Posterior circulation: The intracranial vertebral arteries are  patent. The basilar artery is patent. The posterior cerebral arteries are patent. Posterior communicating arteries are present bilaterally. Venous sinuses: Within the limitations of contrast timing, no convincing thrombus. Anatomic variants: None significant. Review of the MIP images confirms the above findings IMPRESSION: CT head: 1. No evidence of acute intracranial abnormality. 2. Mild chronic small vessel ischemic changes within the cerebral white matter. 3. Bilateral proptosis. CTA neck: 1. The  common carotid and internal carotid arteries are patent within the neck without stenosis or significant atherosclerotic disease. 2. Vertebral arteries patent within the neck. Moderate focal narrowing of the V2 right vertebral artery due to mass effect from degenerative bony spurring. 3. Cervical spondylosis, as described. Spinal canal stenosis is greatest at C4-C5 (at least moderate) and C5-C6 (severe). Multilevel bony neural foraminal narrowing. CTA head: 1. No intracranial large vessel occlusion or proximal high-grade arterial stenosis. 2. Nonstenotic calcified plaque within the intracranial ICAs, bilaterally. Electronically Signed   By: Kellie Simmering D.O.   On: 11/15/2021 11:32   MR BRAIN WO CONTRAST  Result Date: 11/15/2021 CLINICAL DATA:  Stroke follow-up. EXAM: MRI HEAD WITHOUT CONTRAST TECHNIQUE: Multiplanar, multiecho pulse sequences of the brain and surrounding structures were obtained without intravenous contrast. COMPARISON:  Head CT and CTA 11/15/2021.  Head MRI 11/11/2020. FINDINGS: The study is moderately to severely motion degraded, and a coronal T2 sequence was not obtained. Brain: There is no evidence of an acute infarct, intracranial hemorrhage, mass, midline shift, or extra-axial fluid collection. The ventricles and sulci are normal. T2 hyperintensities in the cerebral white matter, pons, and right middle cerebellar peduncle are grossly unchanged from the prior MRI. Vascular: Major intracranial vascular flow voids are preserved. Skull and upper cervical spine: No destructive skull lesion. Sinuses/Orbits: Unremarkable orbits. Grossly clear paranasal sinuses and mastoid air cells. Other: None. IMPRESSION: 1. Motion degraded examination without evidence of acute intracranial abnormality. 2. Grossly unchanged age advanced white matter disease, nonspecific but favored to reflect chronic small vessel ischemia given vascular risk factors. Electronically Signed   By: Logan Bores M.D.   On: 11/15/2021  19:20   ECHOCARDIOGRAM COMPLETE  Result Date: 11/15/2021    ECHOCARDIOGRAM REPORT   Patient Name:   SAMAYA BOARDLEY Date of Exam: 11/15/2021 Medical Rec #:  [id number]      Height:       63.5 in Accession #:    [id number]     Weight:       215.0 lb Date of Birth:  January 17, 1966      BSA:          2.006 m Patient Age:    64 years       BP:           154/69 mmHg Patient Gender: F              HR:           83 bpm. Exam Location:  Inpatient Procedure: 2D Echo, Cardiac Doppler and Color Doppler Indications:    TIA  History:        Patient has no prior history of Echocardiogram examinations.                 Risk Factors:Hypertension, Diabetes and Sleep Apnea.  Sonographer:    Jefferey Pica Referring Phys: Chambers  1. Left ventricular ejection fraction, by estimation, is 70 to 75%. Left ventricular ejection fraction by PLAX is 74 %. The left ventricle has hyperdynamic function. The left ventricle has no regional  wall motion abnormalities. There is mild left ventricular hypertrophy. Left ventricular diastolic parameters are consistent with Grade I diastolic dysfunction (impaired relaxation).  2. Right ventricular systolic function is normal. The right ventricular size is normal. Tricuspid regurgitation signal is inadequate for assessing PA pressure.  3. The mitral valve is abnormal. Trivial mitral valve regurgitation.  4. The aortic valve is tricuspid. Aortic valve regurgitation is not visualized.  5. The inferior vena cava is normal in size with <50% respiratory variability, suggesting right atrial pressure of 8 mmHg. Comparison(s): No prior Echocardiogram. FINDINGS  Left Ventricle: Left ventricular ejection fraction, by estimation, is 70 to 75%. Left ventricular ejection fraction by PLAX is 74 %. The left ventricle has hyperdynamic function. The left ventricle has no regional wall motion abnormalities. The left ventricular internal cavity size was normal in size. There is mild left ventricular  hypertrophy. Left ventricular diastolic parameters are consistent with Grade I diastolic dysfunction (impaired relaxation). Indeterminate filling pressures. Right Ventricle: The right ventricular size is normal. No increase in right ventricular wall thickness. Right ventricular systolic function is normal. Tricuspid regurgitation signal is inadequate for assessing PA pressure. Left Atrium: Left atrial size was normal in size. Right Atrium: Right atrial size was normal in size. Pericardium: There is no evidence of pericardial effusion. Mitral Valve: The mitral valve is abnormal. There is mild calcification of the anterior and posterior mitral valve leaflet(s). Trivial mitral valve regurgitation. Tricuspid Valve: The tricuspid valve is grossly normal. Tricuspid valve regurgitation is trivial. Aortic Valve: The aortic valve is tricuspid. Aortic valve regurgitation is not visualized. Aortic valve peak gradient measures 7.6 mmHg. Pulmonic Valve: The pulmonic valve was normal in structure. Pulmonic valve regurgitation is not visualized. Aorta: The aortic root and ascending aorta are structurally normal, with no evidence of dilitation. Venous: The inferior vena cava is normal in size with less than 50% respiratory variability, suggesting right atrial pressure of 8 mmHg. IAS/Shunts: The interatrial septum was not well visualized.  LEFT VENTRICLE PLAX 2D LV EF:         Left            Diastology                ventricular     LV e' medial:    5.78 cm/s                ejection        LV E/e' medial:  15.1                fraction by     LV e' lateral:   7.43 cm/s                PLAX is 74      LV E/e' lateral: 11.8                %. LVIDd:         4.20 cm LVIDs:         2.40 cm LV PW:         1.20 cm LV IVS:        1.10 cm LVOT diam:     1.90 cm LV SV:         57 LV SV Index:   28 LVOT Area:     2.84 cm  IVC IVC diam: 1.80 cm LEFT ATRIUM             Index        RIGHT ATRIUM  Index LA diam:        3.60 cm 1.79 cm/m    RA Area:     7.18 cm LA Vol (A2C):   42.2 ml 21.04 ml/m  RA Volume:   11.90 ml 5.93 ml/m LA Vol (A4C):   38.9 ml 19.39 ml/m LA Biplane Vol: 40.9 ml 20.39 ml/m  AORTIC VALVE                 PULMONIC VALVE AV Area (Vmax): 2.27 cm     PV Vmax:       0.96 m/s AV Vmax:        137.50 cm/s  PV Peak grad:  3.7 mmHg AV Peak Grad:   7.6 mmHg LVOT Vmax:      110.00 cm/s LVOT Vmean:     66.500 cm/s LVOT VTI:       0.201 m  AORTA Ao Root diam: 2.80 cm Ao Asc diam:  2.70 cm MITRAL VALVE MV Area (PHT): 3.89 cm    SHUNTS MV Decel Time: 195 msec    Systemic VTI:  0.20 m MV E velocity: 87.50 cm/s  Systemic Diam: 1.90 cm MV A velocity: 85.10 cm/s MV E/A ratio:  1.03 Lyman Bishop MD Electronically signed by Lyman Bishop MD Signature Date/Time: 11/15/2021/5:03:04 PM    Final

## 2021-11-16 NOTE — Consult Note (Signed)
Neurosurgery Consultation  Reason for Consult: Left sided weakness Referring Physician: Candiss Norse  CC: Left sided weakness  HPI: This is a 56 y.o. woman that presents with dysarthria and left sided weakness. Of note, she has MRIs of the brain going back to 2016 with left sided lateralizing symptoms, some studies with dysarthria in the indication for scan. She denies any dysarthria today with me, does complain of left sided weakness, worst at the left hip. No new numbness /  parasthesias, no recent change in bowel or bladder function.   ROS: A 14 point ROS was performed and is negative except as noted in the HPI.   PMHx:  Past Medical History:  Diagnosis Date   Allergy    Anemia    history of   Anxiety    Asthma    Depression    Diabetes mellitus    type 2    Fibromyalgia    Gastric ulcer    GERD (gastroesophageal reflux disease)    Graves disease    H/O therapeutic radiation    Migraine    Multiple thyroid nodules    Myofacial muscle pain    Osteoarthritis    Facet hypertrophy with injections   TIA (transient ischemic attack)    02/2015   FamHx:  Family History  Problem Relation Age of Onset   Hypertension Mother    Diabetes Mother    Cancer Mother        rectal   Stroke Father    Heart disease Father 66       MI x5   Hypertension Father    Diabetes Father    Breast cancer Sister        unsure of age , was between 56 and 2   Drug abuse Brother    Alcohol abuse Brother    Drug abuse Brother    Alcohol abuse Brother    Anxiety disorder Neg Hx    Bipolar disorder Neg Hx    Depression Neg Hx    SocHx:  reports that she has been smoking cigarettes. She has a 20.50 pack-year smoking history. She has never used smokeless tobacco. She reports that she does not currently use drugs. She reports that she does not drink alcohol.  Exam: Vital signs in last 24 hours: Temp:  [97.6 F (36.4 C)-98.3 F (36.8 C)] 98.3 F (36.8 C) (05/26 0824) Pulse Rate:  [79-98] 98 (05/25  2352) Resp:  [15-23] 17 (05/25 2352) BP: (110-170)/(46-88) 110/46 (05/26 0824) SpO2:  [92 %-100 %] 100 % (05/25 2352) General: Awake, alert, cooperative, lying in bed in NAD Head: Normocephalic and atruamatic HEENT: Neck supple Pulmonary: breathing room air comfortably, no evidence of increased work of breathing Cardiac: RRR Abdomen: S NT ND Extremities: Warm and well perfused x4 Neuro: AOx3, PERRL, EOMI, FS, speech fluent, no dysarthria, no dysmetria Strength 5/5 x4, SILTx4, no hoffman's, reflexes 2+, no ankle clonus   Assessment and Plan: 56 y.o. woman with left sided weakness and dysarthria at presentation, no clear focal weakness on my exam. 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   -no acute neurosurgical intervention indicated at this time, no scheduled neurosurgical follow up needed unless the patient develops new concerning symptoms or radicular symptoms -please call with any concerns or questions  Judith Part,  MD 11/16/21 11:04 AM [facility]

## 2021-11-16 NOTE — Progress Notes (Signed)
Inpatient Diabetes Program Recommendations  AACE/ADA: New Consensus Statement on Inpatient Glycemic Control (2015)  Target Ranges:  Prepandial:   less than 140 mg/dL      Peak postprandial:   less than 180 mg/dL (1-2 hours)      Critically ill patients:  140 - 180 mg/dL   Lab Results  Component Value Date   GLUCAP 140 (H) 11/16/2021   HGBA1C 11.0 (A) 10/18/2021    Review of Glycemic Control  Latest Reference Range & Units 11/16/21 07:15 11/16/21 08:27 11/16/21 12:33  Glucose-Capillary 70 - 99 mg/dL 327 (H) 268 (H) 140 (H)  (H): Data is abnormally high Diabetes history: Type 2 DM Outpatient Diabetes medications: Toujeo 110 units QD Current orders for Inpatient glycemic control: Semglee 110 units QA, Novolog 0-15 units TID & HS  Inpatient Diabetes Program Recommendations:    Per MAR under Semglee dose "incomplete". RN unaware if patient received.  Consider reducing Semglee to 20 units QHS (97.5 kg x 0.2).  Secure chat sent to MD.    Damaris Schooner with patient regarding outpatient diabetes management. Is being followed by Dr Loanne Drilling outpatient endocrinology.  Patient denies receiving AM dose of Semglee. Nightshift RN held because blood sugar was 100 mg/dL.  She reports her dose was significantly increased when she was placed on steroids three years ago. Has multiple lows a week and has struggle to manage her diabetes." Denies missing doses. Reviewed patient's current A1c of 11.0%. Explained what a A1c is and what it measures. Also reviewed goal A1c with patient, importance of good glucose control @ home, and blood sugar goals. Reviewed patho of DM, impact of steroids to glucose control, survival skills, interventions, when to call MD, vascular changes and commorbidities.  Patient has a meter and testing supplies.  Admits to drinking sugary beverages, especially Pepsi. Reports that she has been trying to work on this and knows it is contributing to her health. Reviewed alternatives, plate  method, and importance of being mindful of CHO intake.  Patient plans to follow up with Dr Dwyane Dee for DM. Has no further questions at this time.   Thanks, Bronson Curb, MSN, RNC-OB Diabetes Coordinator [phone] (8a-5p)

## 2021-11-16 NOTE — Plan of Care (Signed)
Nutrition Education Note  RD consulted for assessment of nutritional status. Pt reports eating eating well PTA with no changes to her intake. She typically has 3 meals with 2 snacks. Breakfast typically includes oatmeal; midmorning snack include watermelon; her lunch consists of a [place] or steak and cheese sub loaded with vegetables; dinner consists of a variety of meat and vegetables; after dinner snack she has more watermelon. She does not typically eat sweets, occasionally custard from [employer]. Her beverages include pepsi, sweet tea and water. She is trying to cut back on pepsi and sweet tea slowly to help with her sugar intake. Pt also endorses eating whole wheat toast and low sodium d/t hypertension.   Lipid Panel     Component Value Date/Time   CHOL 102 11/16/2021 0202   TRIG 199 (H) 11/16/2021 0202   HDL 34 (L) 11/16/2021 0202   CHOLHDL 3.0 11/16/2021 0202   VLDL 40 11/16/2021 0202   LDLCALC 28 11/16/2021 0202   Lab Results  Component Value Date   HGBA1C 11.0 (A) 10/18/2021   RD provided "Heart Healthy Consistent Carbohydrate Nutrition Therapy" handout from the Academy of Nutrition and Dietetics. Reviewed patient's dietary recall. Provided examples on ways to decrease sodium and fat intake in diet and balance blood sugars through Plate Method. Discouraged intake of processed foods and use of salt shaker. Encouraged fresh fruits and vegetables as well as whole grain sources of carbohydrates to maximize fiber intake. Teach back method used.  Expect fair compliance.  Body mass index is 37.49 kg/m. Pt meets criteria for obesity based on current BMI.  Current diet order is heart healthy/carb modified, patient is consuming approximately 100% of meals at this time. Labs and medications reviewed. No further nutrition interventions warranted at this time. RD contact information provided. If additional nutrition issues arise, please re-consult RD.  Clayborne Dana, RDN, LDN Clinical  Nutrition

## 2021-11-16 NOTE — Plan of Care (Signed)

## 2021-11-16 NOTE — Evaluation (Signed)
Occupational Therapy Evaluation Patient Details Name: Kendra Hall MRN: [id number] DOB: 1965/09/13 Today's Date: 11/16/2021   History of Present Illness Pt is a 56 y.o. female admitted 5/25 for stroke-like symptoms. MRI and CT negative. PMH:  depression; DM; TIA (2019); fibromyalgia; and Graves disease   Clinical Impression   Pt awakened for OT eval and immediately went back to sleep at end of session. Pt presents with lethargy, generalized weakness, decreased activity tolerance and slow processing. She needs up to min guard assist for ADLs and mobility assessed this visit. Will follow acutely. Recommending HHOT.      Recommendations for follow up therapy are one component of a multi-disciplinary discharge planning process, led by the attending physician.  Recommendations may be updated based on patient status, additional functional criteria and insurance authorization.   Follow Up Recommendations  Home health OT    Assistance Recommended at Discharge Intermittent Supervision/Assistance  Patient can return home with the following A little help with bathing/dressing/bathroom;A little help with bathing/dressing/bathroom;Assist for transportation;Help with stairs or ramp for entrance    Functional Status Assessment  Patient has had a recent decline in their functional status and demonstrates the ability to make significant improvements in function in a reasonable and predictable amount of time.  Equipment Recommendations  Other (comment) (TBD)    Recommendations for Other Services       Precautions / Restrictions Precautions Precautions: Fall Restrictions Weight Bearing Restrictions: No      Mobility Bed Mobility Overal bed mobility: Needs Assistance Bed Mobility: Supine to Sit, Sit to Supine     Supine to sit: HOB elevated, Supervision Sit to supine: Supervision, HOB elevated   General bed mobility comments: increased time, HOB up, use of rail    Transfers Overall  transfer level: Needs assistance Equipment used: None Transfers: Sit to/from Stand, Bed to chair/wheelchair/BSC Sit to Stand: Min guard Stand pivot transfers: Min guard         General transfer comment: increased time, no physical assist      Balance Overall balance assessment: Needs assistance   Sitting balance-Leahy Scale: Good Sitting balance - Comments: able to don socks EOB without LOB   Standing balance support: No upper extremity supported, Bilateral upper extremity supported, During functional activity Standing balance-Leahy Scale: Fair                             ADL either performed or assessed with clinical judgement   ADL Overall ADL's : Needs assistance/impaired Eating/Feeding: Set up;Bed level Eating/Feeding Details (indicate cue type and reason): observed drinking only Grooming: Wash/dry hands;Sitting;Set up   Upper Body Bathing: Minimal assistance;Sitting   Lower Body Bathing: Sit to/from stand;Min guard   Upper Body Dressing : Set up;Sitting   Lower Body Dressing: Sit to/from stand;Min guard Lower Body Dressing Details (indicate cue type and reason): able to don socks at EOB Toilet Transfer: Stand-pivot;Min guard;BSC/3in1   Toileting- Clothing Manipulation and Hygiene: Set up;Sitting/lateral lean               Vision Ability to See in Adequate Light: 0 Adequate Patient Visual Report: No change from baseline       Perception     Praxis      Pertinent Vitals/Pain Pain Assessment Pain Assessment: Faces Faces Pain Scale: No hurt     Hand Dominance Right   Extremity/Trunk Assessment Upper Extremity Assessment Upper Extremity Assessment: Generalized weakness;Overall [facility] for tasks assessed   Lower  Extremity Assessment Lower Extremity Assessment: Defer to PT evaluation   Cervical / Trunk Assessment Cervical / Trunk Assessment: Normal   Communication Communication Communication: Expressive difficulties;Other (comment)  (mumbling, difficult to understand at times)   Cognition Arousal/Alertness: Lethargic Behavior During Therapy: Flat affect Overall Cognitive Status: Impaired/Different from baseline Area of Impairment: Problem solving                             Problem Solving: Slow processing General Comments: awakened for evaluation, pt groggy     General Comments  VSS on RA. BP monitored    Exercises     Shoulder Instructions      Home Living Family/patient expects to be discharged to:: Private residence Living Arrangements: Children (adult daughter) Available Help at Discharge: Family;Available PRN/intermittently Type of Home: Apartment Home Access: Stairs to enter Entrance Stairs-Number of Steps: 16 Entrance Stairs-Rails: Right;Left;Can reach both Home Layout: One level     Bathroom Shower/Tub: Teacher, early years/pre: Standard     Home Equipment: None          Prior Functioning/Environment Prior Level of Function : Independent/Modified Independent;Driving                        OT Problem List: Decreased strength;Decreased knowledge of use of DME or AE      OT Treatment/Interventions: Self-care/ADL training;DME and/or AE instruction;Therapeutic activities;Balance training;Patient/family education    OT Goals(Current goals can be found in the care plan section) Acute Rehab OT Goals OT Goal Formulation: With patient Time For Goal Achievement: 11/16/21 Potential to Achieve Goals: Good ADL Goals Pt Will Perform Grooming: with modified independence;standing Pt Will Perform Lower Body Bathing: with modified independence;sit to/from stand Pt Will Perform Lower Body Dressing: with modified independence;sit to/from stand Pt Will Transfer to Toilet: with modified independence;ambulating;regular height toilet Pt Will Perform Toileting - Clothing Manipulation and hygiene: with modified independence;sit to/from stand Pt/caregiver will Perform Home  Exercise Program: Both right and left upper extremity;With theraband;Independently Additional ADL Goal #1: Pt will gather items necessary for ADLs around her room modified independently.  OT Frequency: Min 2X/week    Co-evaluation              AM-PAC OT "6 Clicks" Daily Activity     Outcome Measure Help from another person eating meals?: None Help from another person taking care of personal grooming?: A Little Help from another person toileting, which includes using toliet, bedpan, or urinal?: A Little Help from another person bathing (including washing, rinsing, drying)?: A Little Help from another person to put on and taking off regular upper body clothing?: A Little Help from another person to put on and taking off regular lower body clothing?: A Little 6 Click Score: 19   End of Session Equipment Utilized During Treatment: Gait belt  Activity Tolerance: Patient limited by lethargy Patient left: in bed;with call bell/phone within reach;with bed alarm set  OT Visit Diagnosis: Unsteadiness on feet (R26.81);Other abnormalities of gait and mobility (R26.89);Muscle weakness (generalized) (M62.81)                Time: 0947-0962 OT Time Calculation (min): 16 min Charges:  OT General Charges $OT Visit: 1 Visit OT Evaluation $OT Eval Moderate Complexity: 1 Mod Nestor Lewandowsky, OTR/L Acute Rehabilitation Services Pager: [phone] Office: [phone]  Malka So 11/16/2021, 3:39 PM

## 2021-11-16 NOTE — Evaluation (Signed)
Physical Therapy Evaluation Patient Details Name: Kendra Hall MRN: [id number] DOB: 1965/09/03 Today's Date: 11/16/2021  History of Present Illness  Pt is a 56 y.o. female admitted 5/25 for stroke-like symptoms. MRI and CT negative. PMH:  depression; DM; TIA (2019); fibromyalgia; and Graves disease   Clinical Impression  Pt admitted with above diagnosis. PTA pt lived in 2nd floor apartment with her adult daughter, independent mobility/ADLs, driving. Pt currently with functional limitations due to the deficits listed below (see PT Problem List). On eval, pt presenting with lethargy. She required S/min guard bed mobility, min assist transfers, and min assist ambulation 25' with RW. Mobility and gait distance limited by LLE pain. Pt inconsistent with MMT, displaying varying levels of effort. On admission, pt with c/o numbness/tingling LUE/LE. Pt reports this has resolved and now its just the pain in LLE. Pt will benefit from skilled PT to increase their independence and safety with mobility to allow discharge to the venue listed below.          Recommendations for follow up therapy are one component of a multi-disciplinary discharge planning process, led by the attending physician.  Recommendations may be updated based on patient status, additional functional criteria and insurance authorization.  Follow Up Recommendations Home health PT    Assistance Recommended at Discharge PRN  Patient can return home with the following  A little help with bathing/dressing/bathroom;Assist for transportation;Help with stairs or ramp for entrance;Assistance with cooking/housework    Equipment Recommendations Rolling walker (2 wheels)  Recommendations for Other Services       Functional Status Assessment Patient has had a recent decline in their functional status and demonstrates the ability to make significant improvements in function in a reasonable and predictable amount of time.     Precautions /  Restrictions Precautions Precautions: Fall      Mobility  Bed Mobility Overal bed mobility: Needs Assistance Bed Mobility: Supine to Sit, Sit to Supine     Supine to sit: HOB elevated, Supervision Sit to supine: Supervision, HOB elevated   General bed mobility comments: +rail, increased time. Pt needed encouragement to attempt supine to sit without physical assist. Initially reaching for therapist for help with elevating trunk.    Transfers Overall transfer level: Needs assistance Equipment used: Ambulation equipment used Transfers: Sit to/from Stand Sit to Stand: Min assist           General transfer comment: increased time to power up, assist for safety    Ambulation/Gait Ambulation/Gait assistance: Min assist Gait Distance (Feet): 25 Feet Assistive device: Rolling walker (2 wheels) Gait Pattern/deviations: Step-to pattern, Step-through pattern, Antalgic, Decreased stride length Gait velocity: decreased     General Gait Details: Initially attempted gait without AD. Only able to take 2-3 steps due to L hip pain. Returned to sitting EOB and RW retrieved. Antalgic gait with RW. Distance limited by LLE pain.  Stairs            Wheelchair Mobility    Modified Rankin (Stroke Patients Only)       Balance Overall balance assessment: Needs assistance Sitting-balance support: No upper extremity supported, Feet supported Sitting balance-Leahy Scale: Good Sitting balance - Comments: able to don socks EOB   Standing balance support: No upper extremity supported, Bilateral upper extremity supported, During functional activity Standing balance-Leahy Scale: Fair Standing balance comment: RW for amb due to L hip pain  Pertinent Vitals/Pain Pain Assessment Pain Assessment: 0-10 Pain Score: 9  Pain Location: L hip Pain Descriptors / Indicators: Throbbing, Grimacing, Guarding Pain Intervention(s): Limited activity within  patient's tolerance, Repositioned    Home Living Family/patient expects to be discharged to:: Private residence Living Arrangements: Children (adult daughter) Available Help at Discharge: Family;Available PRN/intermittently Type of Home: Apartment Home Access: Stairs to enter Entrance Stairs-Rails: Right;Left;Can reach both Entrance Stairs-Number of Steps: 16   Home Layout: One level Home Equipment: None      Prior Function Prior Level of Function : Independent/Modified Independent;Driving                     Hand Dominance   Dominant Hand: Right    Extremity/Trunk Assessment   Upper Extremity Assessment Upper Extremity Assessment: Defer to OT evaluation    Lower Extremity Assessment Lower Extremity Assessment: Generalized weakness (inconsistent with MMT. Pt does not appear to give full effort.)    Cervical / Trunk Assessment Cervical / Trunk Assessment: Normal  Communication   Communication: No difficulties  Cognition Arousal/Alertness: Awake/alert Behavior During Therapy: WFL for tasks assessed/performed Overall Cognitive Status: Within Functional Limits for tasks assessed                                 General Comments: Pt difficult to understand at times due to mumbling, soft voice.        General Comments General comments (skin integrity, edema, etc.): VSS on RA. BP monitored    Exercises     Assessment/Plan    PT Assessment Patient needs continued PT services  PT Problem List Decreased strength;Decreased mobility;Decreased balance;Pain;Decreased knowledge of use of DME;Decreased activity tolerance       PT Treatment Interventions Therapeutic activities;DME instruction;Gait training;Therapeutic exercise;Patient/family education;Stair training;Functional mobility training    PT Goals (Current goals can be found in the Care Plan section)  Acute Rehab PT Goals Patient Stated Goal: find out what's wrong PT Goal Formulation: With  patient Time For Goal Achievement: 11/30/21 Potential to Achieve Goals: Good    Frequency Min 3X/week     Co-evaluation               AM-PAC PT "6 Clicks" Mobility  Outcome Measure Help needed turning from your back to your side while in a flat bed without using bedrails?: None Help needed moving from lying on your back to sitting on the side of a flat bed without using bedrails?: A Little Help needed moving to and from a bed to a chair (including a wheelchair)?: A Little Help needed standing up from a chair using your arms (e.g., wheelchair or bedside chair)?: A Little Help needed to walk in hospital room?: A Little Help needed climbing 3-5 steps with a railing? : A Lot 6 Click Score: 18    End of Session Equipment Utilized During Treatment: Gait belt Activity Tolerance: Patient limited by pain Patient left: in bed;with call bell/phone within reach Nurse Communication: Mobility status PT Visit Diagnosis: Difficulty in walking, not elsewhere classified (R26.2);Pain    Time: 1136-1208 PT Time Calculation (min) (ACUTE ONLY): 32 min   Charges:   PT Evaluation $PT Eval Moderate Complexity: 1 Mod PT Treatments $Gait Training: 8-22 mins        Lorrin Goodell, PT  Office # [phone] Pager [phone]   Lorriane Shire 11/16/2021, 12:39 PM

## 2021-11-16 NOTE — Discharge Instructions (Signed)
Heart-Healthy Consistent Carbohydrate Nutrition Therapy   A heart-healthy and consistent carbohydrate diet is recommended to manage heart disease and diabetes. To follow a heart-healthy and consistent carbohydrate diet, Eat a balanced diet with whole grains, fruits and vegetables, and lean protein sources. Choose heart-healthy unsaturated fats. Limit saturated fats, trans fats, and cholesterol intake. Eat more plant-based or vegetarian meals using beans and soy foods for protein. Eat whole, unprocessed foods to limit the amount of sodium (salt) you eat. Choose a consistent amount of carbohydrate at each meal and snack. Limit refined carbohydrates especially sugar, sweets and sugar-sweetened beverages. If you drink alcohol, do so in moderation: one serving per day (women) and two servings per day (men). o One serving is equivalent to 12 ounces beer, 5 ounces wine, or 1.5 ounces distilled spirits  Tips Tips for Choosing Heart-Healthy Fats Choose lean protein and low-fat dairy foods to reduce saturated fat intake. Saturated fat is usually found in animal-based protein and is associated with certain health risks. Saturated fat is the biggest contributor to raise low-density lipoprotein (LDL) cholesterol levels. Research shows that limiting saturated fat lowers unhealthy cholesterol levels. Eat no more than 7% of your total calories each day from saturated fat. Ask your RDN to help you determine how much saturated fat is right for you. There are many foods that do not contain large amounts of saturated fats. Swapping these foods to replace foods high in saturated fats will help you limit the saturated fat you eat and improve your cholesterol levels. You can also try eating more plant-based or vegetarian meals.   Instead of. Try:  Whole milk, cheese, yogurt, and ice cream 1% or skim milk, low-fat cheese, non-fat yogurt, and low-fat ice cream  Fatty, marbled beef and pork Lean beef, pork, or venison   Poultry with skin Poultry without skin  Butter, stick margarine Reduced-fat, whipped, or liquid spreads  Coconut oil, palm oil Liquid vegetable oils: corn, canola, olive, soybean and safflower oils   Avoid foods that contain trans fats. Trans fats increase levels of LDL-cholesterol. Hydrogenated fat in processed foods is the main source of trans fats in foods.  Trans fats can be found in stick margarine, shortening, processed sweets, baked goods, some fried foods, and packaged foods made with hydrogenated oils. Avoid foods with "partially hydrogenated oil" on the ingredient list such as: cookies, pastries, baked goods, biscuits, crackers, microwave popcorn, and frozen dinners. Choose foods with heart healthy fats. Polyunsaturated and monounsaturated fat are unsaturated fats that may help lower your blood cholesterol level when used in place of saturated fat in your diet. Ask your RDN about taking a dietary supplement with plant sterols and stanols to help lower your cholesterol level. Research shows that substituting saturated fats with unsaturated fats is beneficial to cholesterol levels. Try these easy swaps:   Instead of. Try:  Butter, stick margarine, or solid shortening Reduced-fat, whipped, or liquid spreads  Beef, pork, or poultry with skin   Fish and seafood  Chips, crackers, snack foods Raw or unsalted nuts and seeds or nut butters Hummus with vegetables Avocado on toast  Coconut oil, palm oil Liquid vegetable oils: corn, canola, olive, soybean and safflower oils    Limit the amount of cholesterol you eat to less than 200 milligrams per day. Cholesterol is a substance carried through the bloodstream via lipoproteins, which are known as "transporters" of fat. Some body functions need cholesterol to work properly, but too much cholesterol in the bloodstream can damage arteries and build up  blood vessel linings (which can lead to heart attack and stroke). You should eat less than 200  milligrams cholesterol per day. People respond differently to eating cholesterol. There is no test available right now that can figure out which people will respond more to dietary cholesterol and which will respond less. For individuals with high intake of dietary cholesterol, different types of increase (none, small, moderate, large) in LDL-cholesterol levels are all possible.   Food sources of cholesterol include egg yolks and organ meats such as liver, gizzards.  Limit egg yolks to two to four per week and avoid organ meats like liver and gizzards to control cholesterol intake.  Tips for Choosing Heart-Healthy Carbohydrates Consume a consistent amount of carbohydrate It is important to eat foods with carbohydrates in moderation because they impact your blood glucose level. Carbohydrates can be found in many foods such as: Grains (breads, crackers, rice, pasta, and cereals) Starchy Vegetables (potatoes, corn, and peas) Beans and legumes Milk, soy milk, and yogurt Fruit and fruit juice Sweets (cakes, cookies, ice cream, jam and jelly) Your RDN will help you set a goal for how many carbohydrate servings to eat at your meals and snacks. For many adults, eating 3 to 5 servings of carbohydrate foods at each meal and 1 or 2 carbohydrate servings for each snack works well. Check your blood glucose level regularly. It can tell you if you need to adjust when you eat carbohydrates.  Choose foods rich in viscous (soluble) fiber Viscous, or soluble, is found in the walls of plant cells. Viscous fiber is found only in plant-based foods. Eating foods with fiber helps to lower your unhealthy cholesterol and keep your blood glucose in range Rich sources of viscous fiber include vegetables (asparagus, Brussels sprouts, sweet potatoes, turnips) fruit (apricots, mangoes, oranges), legumes, and whole grains (barley, oats, and oat bran). As you increase your fiber intake gradually, also increase the amount of water  you drink. This will help prevent constipation. If you have difficulty achieving this goal, ask your RDN about fiber laxatives. Choose fiber supplements made with viscous fibers such as psyllium seed husks or methylcellulose to help lower unhealthy cholesterol.   Limit refined carbohydrates There are three types of carbohydrates: starches, sugar, and fiber. Some carbohydrates occur naturally in food, like the starches in rice or corn or the sugars in fruits and milk. Refined carbohydrates--foods with high amounts of simple sugars--can raise triglyceride levels. High triglyceride levels are associated with coronary heart disease. Some examples of refined carbohydrate foods are table sugar, sweets, and beverages sweetened with added sugar. Tips for Reducing Sodium (Salt) Although sodium is important for your body to function, too much sodium can be harmful for people with high blood pressure. As sodium and fluid buildup in your tissues and bloodstream, your blood pressure increases. High blood pressure may cause damage to other organs and increase your risk for a stroke. Even if you take a pill for blood pressure or a water pill (diuretic) to remove fluid, it is still important to have less salt in your diet. Ask your doctor and RDN what amount of sodium is right for you. Avoid processed foods. Eat more fresh foods. Fresh fruits and vegetables are naturally low in sodium, as well as frozen vegetables and fruits that have no added juices or sauces. Fresh meats are lower in sodium than processed meats, such as bacon, sausage, and hotdogs. Read the nutrition label or ask your butcher to help you find a fresh meat that is  low in sodium. Eat less salt--at the table and when cooking. A single teaspoon of table salt has 2,300 mg of sodium. Leave the salt out of recipes for pasta, casseroles, and soups. Ask your RDN how to cook your favorite recipes without sodium Be a smart shopper. Look for food packages  that say "salt-free" or "sodium-free." These items contain less than 5 milligrams of sodium per serving. "Very low-sodium" products contain less than 35 milligrams of sodium per serving. "Low-sodium" products contain less than 140 milligrams of sodium per serving. Beware for "Unsalted" or "No Added Salt" products. These items may still be high in sodium. Check the nutrition label. Add flavors to your food without adding sodium. Try lemon juice, lime juice, fruit juice or vinegar. Dry or fresh herbs add flavor. Try basil, bay leaf, dill, rosemary, parsley, sage, dry mustard, nutmeg, thyme, and paprika. Pepper, red pepper flakes, and cayenne pepper can add spice t your meals without adding sodium. Hot sauce contains sodium, but if you use just a drop or two, it will not add up to much. Buy a sodium-free seasoning blend or make your own at home.   Additional Lifestyle Tips Achieve and maintain a healthy weight. Talk with your RDN or your doctor about what is a healthy weight for you. Set goals to reach and maintain that weight.  To lose weight, reduce your calorie intake along with increasing your physical activity. A weight loss of 10 to 15 pounds could reduce LDL-cholesterol by 5 milligrams per deciliter. Participate in physical activity. Talk with your health care team to find out what types of physical activity are best for you. Set a plan to get about 30 minutes of exercise on most days.  Foods Recommended Food Group Foods Recommended  Grains Whole grain breads and cereals, including whole wheat, barley, rye, buckwheat, corn, teff, quinoa, millet, amaranth, brown or wild rice, sorghum, and oats Pasta, especially whole wheat or other whole grain types [employer], quinoa or wild rice Whole grain crackers, bread, rolls, pitas Home-made bread with reduced-sodium baking soda  Protein Foods Lean cuts of beef and pork (loin, leg, round, extra lean hamburger) Skinless Cytogeneticist and  other wild game Dried beans and peas Nuts and nut butters Meat alternatives made with soy or textured vegetable protein Egg whites or egg substitute Cold cuts made with lean meat or soy protein  Dairy Nonfat (skim), low-fat, or 1%-fat milk Nonfat or low-fat yogurt or cottage cheese Fat-free and low-fat cheese  Vegetables Fresh, frozen, or canned vegetables without added fat or salt  Fruits Fresh, frozen, canned, or dried fruit  Oils Unsaturated oils (corn, olive, peanut, soy, sunflower, canola) Soft or liquid margarines and vegetable oil spreads Salad dressings Seeds and nuts Avocado   Foods Not Recommended Food Group Foods Not Recommended  Grains Breads or crackers topped with salt Cereals (hot or cold) with more than 300 mg sodium per serving Biscuits, cornbread, and other "quick" breads prepared with baking soda Bread crumbs or stuffing mix from a store High-fat bakery products, such as doughnuts, biscuits, croissants, danish pastries, pies, cookies Instant cooking foods to which you add hot water and stir--potatoes, noodles, rice, etc. Packaged starchy foods--seasoned noodle or rice dishes, stuffing mix, macaroni and cheese dinner Snacks made with partially hydrogenated oils, including chips, cheese puffs, snack mixes, regular crackers, butter-flavored popcorn  Protein Foods Higher-fat cuts of meats (ribs, t-bone steak, regular hamburger) Bacon, sausage, or hot dogs Cold cuts, such as salami or bologna, deli  meats, cured meats, corned beef Organ meats (liver, brains, gizzards, sweetbreads) Poultry with skin Fried or smoked meat, poultry, and fish Whole eggs and egg yolks (more than 2-4 per week) Salted legumes, nuts, seeds, or nut/seed butters Meat alternatives with high levels of sodium (>300 mg per serving) or saturated fat (>5 g per serving)  Dairy Whole milk,?2% fat milk, buttermilk Whole milk yogurt or ice cream Cream Half-&-half Cream cheese Sour cream Cheese   Vegetables Canned or frozen vegetables with salt, fresh vegetables prepared with salt, butter, cheese, or cream sauce Fried vegetables Pickled vegetables such as olives, pickles, or sauerkraut  Fruits Fried fruits Fruits served with butter or cream  Oils Butter, stick margarine, shortening Partially hydrogenated oils or trans fats Tropical oils (coconut, palm, palm kernel oils)  Other Candy, sugar sweetened soft drinks and desserts Salt, sea salt, garlic salt, and seasoning mixes containing salt Bouillon cubes Ketchup, barbecue sauce, Worcestershire sauce, soy sauce, teriyaki sauce Miso Salsa Pickles, olives, relish

## 2021-11-17 DIAGNOSIS — R2990 Unspecified symptoms and signs involving the nervous system: Secondary | ICD-10-CM | POA: Diagnosis not present

## 2021-11-17 LAB — GLUCOSE, CAPILLARY
Glucose-Capillary: 85 mg/dL (ref 70–99)
Glucose-Capillary: 336 mg/dL — ABNORMAL HIGH (ref 70–99)

## 2021-11-17 MED ORDER — CLOPIDOGREL BISULFATE 75 MG PO TABS: 75.0000 mg | ORAL_TABLET | Freq: Every day | ORAL | 0 refills | Status: DC

## 2021-11-17 MED ORDER — NICOTINE 14 MG/24HR TD PT24: 14.0000 mg | MEDICATED_PATCH | Freq: Every day | TRANSDERMAL | 0 refills | Status: DC

## 2021-11-17 MED ORDER — LEVOTHYROXINE SODIUM 112 MCG PO TABS: 112.0000 ug | ORAL_TABLET | Freq: Every day | ORAL | 1 refills | Status: DC

## 2021-11-17 MED ORDER — ASPIRIN EC 81 MG PO TBEC: 81.0000 mg | DELAYED_RELEASE_TABLET | Freq: Every day | ORAL | 11 refills | Status: AC

## 2021-11-17 MED ORDER — CLOPIDOGREL BISULFATE 75 MG PO TABS
75.0000 mg | ORAL_TABLET | Freq: Every day | ORAL | Status: DC
  Administered 2021-11-17: 75 mg via ORAL
  Filled 2021-11-17: qty 1

## 2021-11-17 MED ORDER — FAMOTIDINE 20 MG PO TABS: 20.0000 mg | ORAL_TABLET | Freq: Every evening | ORAL | 0 refills | Status: DC

## 2021-11-17 NOTE — Progress Notes (Signed)
Occupational Therapy Treatment and Discharge Patient Details Name: Kendra Hall MRN: [id number] DOB: [birth year] Today's Date: 11/17/2021   History of present illness Pt is a 56 y.o. female admitted 5/25 for stroke-like symptoms. MRI and CT negative. PMH:  depression; DM; TIA (2019); fibromyalgia; and Graves disease   OT comments  Pt has returned to modified independence in self care and ADL transfers. Her L hip pain is greatly improved. No further OT needs.    Recommendations for follow up therapy are one component of a multi-disciplinary discharge planning process, led by the attending physician.  Recommendations may be updated based on patient status, additional functional criteria and insurance authorization.    Follow Up Recommendations  No OT follow up    Assistance Recommended at Discharge PRN  Patient can return home with the following  Assist for transportation   Equipment Recommendations  None recommended by OT    Recommendations for Other Services      Precautions / Restrictions Precautions Precautions: None Restrictions Weight Bearing Restrictions: No       Mobility Bed Mobility Overal bed mobility: Modified Independent                  Transfers Overall transfer level: Modified independent Equipment used: None                     Balance Overall balance assessment: Mild deficits observed, not formally tested                                         ADL either performed or assessed with clinical judgement   ADL Overall ADL's : Modified independent                                            Extremity/Trunk Assessment              Vision       Perception     Praxis      Cognition Arousal/Alertness: Awake/alert Behavior During Therapy: WFL for tasks assessed/performed Overall Cognitive Status: Within Functional Limits for tasks assessed                                           Exercises      Shoulder Instructions       General Comments      Pertinent Vitals/ Pain       Pain Assessment Pain Assessment: Faces Faces Pain Scale: Hurts a little bit Pain Location: L hip Pain Descriptors / Indicators: Discomfort, Grimacing Pain Intervention(s): Monitored during session, Repositioned  Home Living                                          Prior Functioning/Environment              Frequency           Progress Toward Goals  OT Goals(current goals can now be found in the care plan section)  Progress towards OT goals: Goals met/education completed, patient discharged from OT  Acute Rehab OT  Goals OT Goal Formulation: With patient  Plan All goals met and education completed, patient discharged from OT services    Co-evaluation                 AM-PAC OT "6 Clicks" Daily Activity     Outcome Measure   Help from another person eating meals?: None Help from another person taking care of personal grooming?: None Help from another person toileting, which includes using toliet, bedpan, or urinal?: None Help from another person bathing (including washing, rinsing, drying)?: None Help from another person to put on and taking off regular upper body clothing?: None Help from another person to put on and taking off regular lower body clothing?: None 6 Click Score: 24    End of Session    OT Visit Diagnosis: Pain   Activity Tolerance Patient tolerated treatment well   Patient Left in bed;with call bell/phone within reach   Nurse Communication          Time: 0920-0943 OT Time Calculation (min): 23 min  Charges: OT General Charges $OT Visit: 1 Visit OT Treatments $Self Care/Home Management : 23-37 mins  Kendra Hall, OTR/L Acute Rehabilitation Services Pager: [phone] Office: [phone]  Kendra Hall 11/17/2021, 1:23 PM

## 2021-11-17 NOTE — Plan of Care (Signed)

## 2021-11-17 NOTE — Evaluation (Signed)
Physical Therapy Evaluation & Discharge Patient Details Name: Kendra Hall MRN: [id number] DOB: [birth year] Today's Date: 11/17/2021  History of Present Illness  Pt is a 56 y.o. female admitted 5/25 for stroke-like symptoms. MRI and CT negative. PMH:  depression; DM; TIA (2019); fibromyalgia; and Graves disease  Clinical Impression  Patient met 3/3 PT goals this session. Ambulating at modI level with no AD. Able to safely negotiate flight of stairs to access 2nd floor apartment. Patient continues to complain of L LE pain. Continues to be limited by activity tolerance but overall improved from last session. Updated discharge recommendation to OPPT at discharge to address strength and endurance deficits. No further skilled PT needs identified acutely. PT will sign off.     Recommendations for follow up therapy are one component of a multi-disciplinary discharge planning process, led by the attending physician.  Recommendations may be updated based on patient status, additional functional criteria and insurance authorization.  Follow Up Recommendations Outpatient PT    Assistance Recommended at Discharge PRN  Patient can return home with the following  A little help with bathing/dressing/bathroom;Assist for transportation;Help with stairs or ramp for entrance;Assistance with cooking/housework    Equipment Recommendations None recommended by PT  Recommendations for Other Services       Functional Status Assessment       Precautions / Restrictions Precautions Precautions: Fall Restrictions Weight Bearing Restrictions: No      Mobility  Bed Mobility Overal bed mobility: Modified Independent                  Transfers Overall transfer level: Modified independent Equipment used: None                    Ambulation/Gait Ambulation/Gait assistance: Modified independent (Device/Increase time) Gait Distance (Feet): 300 Feet Assistive device: None Gait  Pattern/deviations: WFL(Within Functional Limits) Gait velocity: decreased     General Gait Details: complaining of L LE pain but able to complete ambulation with x1 standing rest break  Stairs Stairs: Yes Stairs assistance: Modified independent (Device/Increase time) Stair Management: One rail Left, Alternating pattern, Forwards Number of Stairs: 10    Wheelchair Mobility    Modified Rankin (Stroke Patients Only)       Balance Overall balance assessment: Mild deficits observed, not formally tested                                           Pertinent Vitals/Pain Pain Assessment Pain Assessment: Faces Faces Pain Scale: Hurts a little bit Pain Location: L hip Pain Descriptors / Indicators: Discomfort, Grimacing Pain Intervention(s): Monitored during session    Home Living                          Prior Function                       Hand Dominance        Extremity/Trunk Assessment                Communication      Cognition Arousal/Alertness: Awake/alert Behavior During Therapy: WFL for tasks assessed/performed Overall Cognitive Status: Within Functional Limits for tasks assessed  General Comments      Exercises     Assessment/Plan    PT Assessment    PT Problem List         PT Treatment Interventions      PT Goals (Current goals can be found in the Care Plan section)  Acute Rehab PT Goals PT Goal Formulation: With patient    Frequency Min 3X/week     Co-evaluation               AM-PAC PT "6 Clicks" Mobility  Outcome Measure Help needed turning from your back to your side while in a flat bed without using bedrails?: None Help needed moving from lying on your back to sitting on the side of a flat bed without using bedrails?: None Help needed moving to and from a bed to a chair (including a wheelchair)?: None Help needed standing up  from a chair using your arms (e.g., wheelchair or bedside chair)?: None Help needed to walk in hospital room?: None Help needed climbing 3-5 steps with a railing? : None 6 Click Score: 24    End of Session   Activity Tolerance: Patient tolerated treatment well Patient left: in bed;with call bell/phone within reach Nurse Communication: Mobility status PT Visit Diagnosis: Difficulty in walking, not elsewhere classified (R26.2);Pain    Time: 0240-9735 PT Time Calculation (min) (ACUTE ONLY): 25 min   Charges:     PT Treatments $Gait Training: 23-37 mins        Darlina Mccaughey A. Gilford Rile PT, DPT Acute Rehabilitation Services Pager [phone] Office [phone]   Linna Hoff 11/17/2021, 1:01 PM

## 2021-11-17 NOTE — Discharge Summary (Signed)
Kendra Hall DGU:[id number] DOB: 05/10/1966 DOA: 11/15/2021  PCP: Simona Huh, NP  Admit date: 11/15/2021  Discharge date: 11/17/2021  Admitted From: Home   Disposition:  Home   Recommendations for Outpatient Follow-up:   Follow up with PCP in 1-2 weeks  PCP Please obtain BMP/CBC, 2 view CXR in 1week,  (see Discharge instructions)   PCP Please follow up on the following pending results: Monitor TSH, free T4 and Synthroid dose closely, check CBC, CMP in 7 to 10 days.  Needs close outpatient endocrine and neurology follow-up within 1 to 2 weeks of discharge.   Home Health: PT, OT if qulifies   Equipment/Devices: None  Consultations: Neurosurgery, neurology Discharge Condition: Stable    CODE STATUS: Full    Diet Recommendation: Heart Healthy Low Carb    Chief Complaint  Patient presents with   Chest Pain   Numbness     Brief history of present illness from the day of admission and additional interim summary    56 y.o. female with medical history significant of depression; DM; TIA (2019); fibromyalgia; and Graves disease presenting with stroke-like symptoms. She had slurred speech for 2 days but didn't really pay attention, thought she was excessively tired.  She also had pain in her neck around the C-spine area radiating down to her left arm and left leg.  She has known history of C-spine stenosis.  She was admitted for further care.                                                                 Hospital Course    Stroke-like symptoms versus symptoms coming from C-spine stenosis.  MRI brain, CTA head and neck nonacute, echocardiogram stable, also underwent MRI of C-spine which had nonspecific changes was seen by neurology and neurosurgery.  At this time symptoms most likely suggestive of TIA, LDL was stable,  A1c suggestive of poor glycemic control due to hyperglycemia, patient received diabetes and insulin.  Was on aspirin before which will be continued, Plavix added for 21 days continue home dose statin.  PCP to monitor secondary to factors closely.  Case discussed with Dr. Lynnae Sandhoff neurologist on 11/17/2021 in detail.  PCP to arrange for outpatient neurology follow-up in 1 to [redacted] weeks along with endocrine follow-up   HTN - continue home blood pressure regimen.   HLD -  Continue Lipitor, LDL under control.   Hypothyroidism  - H/O Graves s/p ablation, TSH severely suppressed.  Hold Synthroid for now.  Resume at half home dose upon discharge with PCP to monitor closely.   Mood d/o - Continue Cymbalta, Lamictal, hydroxyzine, mirtazepine   OSA  - Continue CPAP   Tobacco dependence - Encourage cessation. Patch ordered at patient request.    Class 2 Obesity - Body mass index  is 37.49 kg/m.  BCP for weight loss.   DM 2 - in poor outpatient control due to hyperglycemia with elevated A1c recently.  Diabetic and insulin education, continue insulin regimen requested to check CBGs before every meal at bedtime maintain a logbook and showed to PCP next visit.  PCP to monitor and adjust further dose of insulin as needed.  Will benefit from outpatient endocrine follow-up     Discharge diagnosis     Principal Problem:   Stroke-like symptoms Active Problems:   Class 2 obesity due to excess calories with body mass index (BMI) of 37.0 to 37.9 in adult   TOBACCO USER   Mood disorder (HCC)   Hypothyroidism following radioiodine therapy   Hypertension   Type 2 diabetes mellitus without complication (HCC)   Severe obstructive sleep apnea-hypopnea syndrome   Dyslipidemia   Cervical stenosis of spine    Discharge instructions    Discharge Instructions     Discharge instructions   Complete by: As directed    Follow with Primary MD Simona Huh, NP in 7 days   Get CBC, CMP,  TSH, free T4  -   checked next visit within 1 week by Primary MD   Activity: As tolerated with Full fall precautions use walker/cane & assistance as needed  Disposition Home    Diet: Heart Healthy Low Carb  Accuchecks 4 times/day, Once in AM empty stomach and then before each meal. Log in all results and show them to your Prim.MD in 3 days. If any glucose reading is under 80 or above 300 call your Prim MD immidiately. Follow Low glucose instructions for glucose under 80 as instructed.  Special Instructions: If you have smoked or chewed Tobacco  in the last 2 yrs please stop smoking, stop any regular Alcohol  and or any Recreational drug use.  On your next visit with your primary care physician please Get Medicines reviewed and adjusted.  Please request your Prim.MD to go over all Hospital Tests and Procedure/Radiological results at the follow up, please get all Hospital records sent to your Prim MD by signing hospital release before you go home.  If you experience worsening of your admission symptoms, develop shortness of breath, life threatening emergency, suicidal or homicidal thoughts you must seek medical attention immediately by calling 911 or calling your MD immediately  if symptoms less severe.  You Must read complete instructions/literature along with all the possible adverse reactions/side effects for all the Medicines you take and that have been prescribed to you. Take any new Medicines after you have completely understood and accpet all the possible adverse reactions/side effects.   Increase activity slowly   Complete by: As directed        Discharge Medications   Allergies as of 11/17/2021       Reactions   Gabapentin Other (See Comments)   unknown   Eggs Or Egg-derived Products Other (See Comments)   Don't eat eggs   Ergotrate [ergonovine] Other (See Comments)   Pt does not ever want   Ibuprofen Other (See Comments)   Patient has ulcer   Latex Other (See Comments)   Breaks out    Metformin And Related Other (See Comments)   Dizziness   Yutopar [ritodrine] Other (See Comments)   Pt does not ever want        Medication List     TAKE these medications    acetaminophen 500 MG tablet Commonly known as: TYLENOL Take 1,000 mg by mouth every  6 (six) hours as needed for moderate pain.   albuterol 108 (90 Base) MCG/ACT inhaler Commonly known as: ProAir HFA Inhale 2 puffs into the lungs every 4 (four) hours as needed for wheezing.   albuterol (2.5 MG/3ML) 0.083% nebulizer solution Commonly known as: PROVENTIL Take 3 mLs by nebulization 4 (four) times daily as needed for wheezing or shortness of breath.   amLODipine 10 MG tablet Commonly known as: NORVASC Take 10 mg by mouth daily.   aspirin EC 81 MG tablet Take 1 tablet (81 mg total) by mouth daily. Swallow whole.   atorvastatin 20 MG tablet Commonly known as: LIPITOR Take 20 mg by mouth every evening.   BD Pen Needle Nano 2nd Gen 32G X 4 MM Misc Generic drug: Insulin Pen Needle USE TO INJECT LANTUS UNDER THE SKIN EVERY MORNING   benzonatate 200 MG capsule Commonly known as: TESSALON Take 200 mg by mouth 3 (three) times daily as needed for cough.   clopidogrel 75 MG tablet Commonly known as: PLAVIX Take 1 tablet (75 mg total) by mouth daily.   DULoxetine 60 MG capsule Commonly known as: CYMBALTA Take 60 mg by mouth 2 (two) times daily.   Emgality 120 MG/ML Soaj Generic drug: Galcanezumab-gnlm ADMINISTER 1 ML UNDER THE SKIN EVERY 30 DAYS What changed: See the new instructions.   famciclovir 500 MG tablet Commonly known as: FAMVIR Take 500 mg by mouth 2 (two) times daily.   famotidine 20 MG tablet Commonly known as: PEPCID Take 1 tablet (20 mg total) by mouth every evening.   hydrOXYzine 25 MG capsule Commonly known as: VISTARIL Take 25 mg by mouth 3 (three) times daily as needed for itching.   lamoTRIgine 150 MG tablet Commonly known as: LAMICTAL Take 150 mg by mouth at bedtime.    levothyroxine 112 MCG tablet Commonly known as: SYNTHROID Take 1 tablet (112 mcg total) by mouth daily. Start taking on: Nov 20, 2021 What changed:  how much to take These instructions start on Nov 20, 2021. If you are unsure what to do until then, ask your doctor or other care provider.   linaclotide 290 MCG Caps capsule Commonly known as: LINZESS Take 290 mcg by mouth daily before breakfast.   Magnesium 250 MG Tabs Take 750 mg by mouth at bedtime.   mirtazapine 7.5 MG tablet Commonly known as: REMERON Take 7.5 mg by mouth at bedtime.   montelukast 10 MG tablet Commonly known as: SINGULAIR Take 10 mg by mouth daily.   nicotine 14 mg/24hr patch Commonly known as: NICODERM CQ - dosed in mg/24 hours Place 1 patch (14 mg total) onto the skin daily. Start taking on: Nov 18, 2021   OneTouch Verio test strip Generic drug: glucose blood CHECK YOUR BLOOD SUGAR ONCE DAILY(VARY THE TIME OF DAY WHEN YOU CHECK BETWEEN, BEFORE THE 3 MEALS, AND AT BEDTIME)   promethazine 12.5 MG tablet Commonly known as: PHENERGAN Take 12.5 mg by mouth every 6 (six) hours as needed for nausea or vomiting.   rizatriptan 10 MG disintegrating tablet Commonly known as: MAXALT-MLT DISSOLVE 1 TABLET ON THE TONGUE AT ONSET OF HEADACHE. MAY REPEAT 1 TIME IN 2 HOURS AS NEEDED What changed:  how much to take how to take this when to take this reasons to take this additional instructions   Toujeo Max SoloStar 300 UNIT/ML Solostar Pen Generic drug: insulin glargine (2 Unit Dial) Inject 110 Units into the skin every morning.         Follow-up Information  Simona Huh, NP. Schedule an appointment as soon as possible for a visit in 1 week(s).   Specialty: Nurse Practitioner Contact information: [place] [address] [phone]         [place]. Schedule an appointment as soon as possible for a visit in 2 week(s).   Contact information: [address]     [place] [address] [phone]                Major procedures and Radiology Reports - PLEASE review detailed and final reports thoroughly  -       CT ANGIO HEAD NECK W WO CM  Result Date: 11/15/2021 CLINICAL DATA:  Provided history: Neuro deficit, acute, stroke suspected. Additional history obtained from [place] arm numbness, headache, left-sided chest pain, shortness of breath, nausea. EXAM: CT ANGIOGRAPHY HEAD AND NECK TECHNIQUE: Multidetector CT imaging of the head and neck was performed using the standard protocol during bolus administration of intravenous contrast. Multiplanar CT image reconstructions and MIPs were obtained to evaluate the vascular anatomy. Carotid stenosis measurements (when applicable) are obtained utilizing NASCET criteria, using the distal internal carotid diameter as the denominator. RADIATION DOSE REDUCTION: This exam was performed according to the departmental dose-optimization program which includes automated exposure control, adjustment of the mA and/or kV according to patient size and/or use of iterative reconstruction technique. CONTRAST:  5m OMNIPAQUE IOHEXOL 350 MG/ML SOLN COMPARISON:  Brain MRI 11/11/2020. Head CT 11/11/2020. MRA head 09/26/2017. FINDINGS: CT HEAD FINDINGS Brain: Cerebral volume is normal. Patchy and ill-defined hypoattenuation within the cerebral white matter, nonspecific but compatible with mild chronic small vessel ischemic disease. There is no acute intracranial hemorrhage. No demarcated cortical infarct. No extra-axial fluid collection. No evidence of an intracranial mass. No midline shift. Vascular: No hyperdense vessel. Skull: No fracture or aggressive osseous lesion. Sinuses: No significant paranasal sinus disease. Orbits: Bilateral proptosis. No orbital mass or acute orbital finding. Review of the MIP images confirms the above findings CTA NECK FINDINGS Aortic arch:  The left vertebral artery arises directly from the aortic arch. Common origin of the innominate and left common carotid arteries. The visualized aortic arch is normal in caliber. Streak and beam hardening artifact arising from a dense right-sided contrast bolus partially obscures the right subclavian artery. Within this limitation, there is no appreciable hemodynamically significant innominate or proximal subclavian artery stenosis. Right carotid system: CCA and ICA patent within the neck without stenosis or significant atherosclerotic disease. Left carotid system: CCA and ICA patent within the neck without stenosis or significant atherosclerotic disease. Vertebral arteries: The right vertebral artery is dominant. Vertebral arteries patent within the neck. Moderate V2 right vertebral artery at C4-C5 level due to mass effect from degenerative bony spurring. Otherwise, there is no significant stenosis within the cervical vertebral arteries. Skeleton: Cervical spondylosis with multilevel disc space narrowing, disc bulges/central disc protrusions, endplate spurring and uncovertebral hypertrophy. Disc space narrowing is advanced at C4-C5, C5-C6 and C6-C7. Multilevel spinal canal stenosis. Most notably, spinal canal stenosis appears moderate/severe at C4-C5 and severe at C5-C6. Multilevel bony neural foraminal narrowing. Other neck: No neck mass or cervical lymphadenopathy. Diminutive thyroid gland. Upper chest: No consolidation within the imaged lung apices. Review of the MIP images confirms the above findings CTA HEAD FINDINGS Anterior circulation: The intracranial vertebral arteries are patent.Nonstenotic atherosclerotic plaque within both vessels. The M1 middle cerebral arteries are patent. No M2 proximal branch occlusion or high-grade proximal stenosis. The anterior cerebral  arteries are patent. No intracranial aneurysm is identified. Posterior circulation: The intracranial vertebral arteries are patent. The basilar  artery is patent. The posterior cerebral arteries are patent. Posterior communicating arteries are present bilaterally. Venous sinuses: Within the limitations of contrast timing, no convincing thrombus. Anatomic variants: None significant. Review of the MIP images confirms the above findings IMPRESSION: CT head: 1. No evidence of acute intracranial abnormality. 2. Mild chronic small vessel ischemic changes within the cerebral white matter. 3. Bilateral proptosis. CTA neck: 1. The common carotid and internal carotid arteries are patent within the neck without stenosis or significant atherosclerotic disease. 2. Vertebral arteries patent within the neck. Moderate focal narrowing of the V2 right vertebral artery due to mass effect from degenerative bony spurring. 3. Cervical spondylosis, as described. Spinal canal stenosis is greatest at C4-C5 (at least moderate) and C5-C6 (severe). Multilevel bony neural foraminal narrowing. CTA head: 1. No intracranial large vessel occlusion or proximal high-grade arterial stenosis. 2. Nonstenotic calcified plaque within the intracranial ICAs, bilaterally. Electronically Signed   By: Kellie Simmering D.O.   On: 11/15/2021 11:32   CT HEAD WO CONTRAST (5MM)  Result Date: 11/15/2021 CLINICAL DATA:  Provided history: Neuro deficit, acute, stroke suspected. Additional history obtained from [place] arm numbness, headache, left-sided chest pain, shortness of breath, nausea. EXAM: CT ANGIOGRAPHY HEAD AND NECK TECHNIQUE: Multidetector CT imaging of the head and neck was performed using the standard protocol during bolus administration of intravenous contrast. Multiplanar CT image reconstructions and MIPs were obtained to evaluate the vascular anatomy. Carotid stenosis measurements (when applicable) are obtained utilizing NASCET criteria, using the distal internal carotid diameter as the denominator. RADIATION DOSE REDUCTION: This exam was performed according to the  departmental dose-optimization program which includes automated exposure control, adjustment of the mA and/or kV according to patient size and/or use of iterative reconstruction technique. CONTRAST:  83m OMNIPAQUE IOHEXOL 350 MG/ML SOLN COMPARISON:  Brain MRI 11/11/2020. Head CT 11/11/2020. MRA head 09/26/2017. FINDINGS: CT HEAD FINDINGS Brain: Cerebral volume is normal. Patchy and ill-defined hypoattenuation within the cerebral white matter, nonspecific but compatible with mild chronic small vessel ischemic disease. There is no acute intracranial hemorrhage. No demarcated cortical infarct. No extra-axial fluid collection. No evidence of an intracranial mass. No midline shift. Vascular: No hyperdense vessel. Skull: No fracture or aggressive osseous lesion. Sinuses: No significant paranasal sinus disease. Orbits: Bilateral proptosis. No orbital mass or acute orbital finding. Review of the MIP images confirms the above findings CTA NECK FINDINGS Aortic arch: The left vertebral artery arises directly from the aortic arch. Common origin of the innominate and left common carotid arteries. The visualized aortic arch is normal in caliber. Streak and beam hardening artifact arising from a dense right-sided contrast bolus partially obscures the right subclavian artery. Within this limitation, there is no appreciable hemodynamically significant innominate or proximal subclavian artery stenosis. Right carotid system: CCA and ICA patent within the neck without stenosis or significant atherosclerotic disease. Left carotid system: CCA and ICA patent within the neck without stenosis or significant atherosclerotic disease. Vertebral arteries: The right vertebral artery is dominant. Vertebral arteries patent within the neck. Moderate V2 right vertebral artery at C4-C5 level due to mass effect from degenerative bony spurring. Otherwise, there is no significant stenosis within the cervical vertebral arteries. Skeleton: Cervical  spondylosis with multilevel disc space narrowing, disc bulges/central disc protrusions, endplate spurring and uncovertebral hypertrophy. Disc space narrowing is advanced at C4-C5, C5-C6 and C6-C7. Multilevel spinal canal stenosis. Most notably, spinal  canal stenosis appears moderate/severe at C4-C5 and severe at C5-C6. Multilevel bony neural foraminal narrowing. Other neck: No neck mass or cervical lymphadenopathy. Diminutive thyroid gland. Upper chest: No consolidation within the imaged lung apices. Review of the MIP images confirms the above findings CTA HEAD FINDINGS Anterior circulation: The intracranial vertebral arteries are patent.Nonstenotic atherosclerotic plaque within both vessels. The M1 middle cerebral arteries are patent. No M2 proximal branch occlusion or high-grade proximal stenosis. The anterior cerebral arteries are patent. No intracranial aneurysm is identified. Posterior circulation: The intracranial vertebral arteries are patent. The basilar artery is patent. The posterior cerebral arteries are patent. Posterior communicating arteries are present bilaterally. Venous sinuses: Within the limitations of contrast timing, no convincing thrombus. Anatomic variants: None significant. Review of the MIP images confirms the above findings IMPRESSION: CT head: 1. No evidence of acute intracranial abnormality. 2. Mild chronic small vessel ischemic changes within the cerebral white matter. 3. Bilateral proptosis. CTA neck: 1. The common carotid and internal carotid arteries are patent within the neck without stenosis or significant atherosclerotic disease. 2. Vertebral arteries patent within the neck. Moderate focal narrowing of the V2 right vertebral artery due to mass effect from degenerative bony spurring. 3. Cervical spondylosis, as described. Spinal canal stenosis is greatest at C4-C5 (at least moderate) and C5-C6 (severe). Multilevel bony neural foraminal narrowing. CTA head: 1. No intracranial large  vessel occlusion or proximal high-grade arterial stenosis. 2. Nonstenotic calcified plaque within the intracranial ICAs, bilaterally. Electronically Signed   By: Kellie Simmering D.O.   On: 11/15/2021 11:32   MR BRAIN WO CONTRAST  Result Date: 11/15/2021 CLINICAL DATA:  Stroke follow-up. EXAM: MRI HEAD WITHOUT CONTRAST TECHNIQUE: Multiplanar, multiecho pulse sequences of the brain and surrounding structures were obtained without intravenous contrast. COMPARISON:  Head CT and CTA 11/15/2021.  Head MRI 11/11/2020. FINDINGS: The study is moderately to severely motion degraded, and a coronal T2 sequence was not obtained. Brain: There is no evidence of an acute infarct, intracranial hemorrhage, mass, midline shift, or extra-axial fluid collection. The ventricles and sulci are normal. T2 hyperintensities in the cerebral white matter, pons, and right middle cerebellar peduncle are grossly unchanged from the prior MRI. Vascular: Major intracranial vascular flow voids are preserved. Skull and upper cervical spine: No destructive skull lesion. Sinuses/Orbits: Unremarkable orbits. Grossly clear paranasal sinuses and mastoid air cells. Other: None. IMPRESSION: 1. Motion degraded examination without evidence of acute intracranial abnormality. 2. Grossly unchanged age advanced white matter disease, nonspecific but favored to reflect chronic small vessel ischemia given vascular risk factors. Electronically Signed   By: Logan Bores M.D.   On: 11/15/2021 19:20   MR CERVICAL SPINE WO CONTRAST  Result Date: 11/16/2021 CLINICAL DATA:  Acute cervical myelopathy. Slurred speech for 2 days. Left upper extremity tingling down to fingertips. Left arm pain. EXAM: MRI CERVICAL SPINE WITHOUT CONTRAST TECHNIQUE: Multiplanar, multisequence MR imaging of the cervical spine was performed. No intravenous contrast was administered. COMPARISON:  CT cervical spine 06/23/2018 FINDINGS: Alignment: There is again straightening of the normal cervical  lordosis. Minimal 1-2 mm retrolisthesis of C4 on C5 C5 on C6 and C6 on C7, unchanged from prior. Vertebrae: Vertebral body heights are maintained. Moderate C4-5 and C5-6 and moderate to severe C6-7 disc space narrowing with anterior endplate osteophytes at these levels. Moderate C4-5 edematous marrow endplate degenerative changes favored to be stress related/reactive. No fluid is seen within the C4-5 disc space to indicate discitis/osteomyelitis. Mild-to-moderate C5-6 and C6-7 chronic fat intensity marrow endplate degenerative changes.  The atlantodens interval is intact. Cord: Normal signal and caliber of the cervical cord. Posterior Fossa, vertebral arteries, paraspinal tissues: Visualized posterior fossa is unremarkable. Preserved vertebral body flow voids. No cervical chain lymphadenopathy. Disc levels: Unfortunately, there is moderate patient motion artifact on the axial T2 and gradient echo series that limits evaluation. C2-3: No posterior disc bulge, central canal narrowing, or neuroforaminal stenosis. C3-4: Mild bilateral facet joint hypertrophy. Mild broad-based posterior disc bulge. No significant central canal stenosis. Left-greater-than-right uncovertebral hypertrophy. Probable mild-to-moderate left and mild right neuroforaminal stenosis. C4-5: Moderate bilateral facet joint hypertrophy. Moderate broad-based posterior disc osteophyte complex with bilateral intraforaminal extension. At least moderate bilateral neuroforaminal stenosis. Mild-to-moderate central canal stenosis. Disc mildly impresses on the ventral cord. C5-6: Moderate bilateral facet joint hypertrophy. Moderate broad-based posterior disc osteophyte complex mildly impresses on the ventral cord. Moderate central canal stenosis. At least moderate bilateral neuroforaminal stenosis. C6-7: Moderate bilateral facet joint hypertrophy. Mild-to-moderate broad-based posterior disc osteophyte complex. Left-greater-than-right intraforaminal disc  osteophyte extension. Moderate to severe left and moderate right neuroforaminal stenosis. No significant central canal stenosis. C7-T1: Mild bilateral facet joint hypertrophy. Mild extension of disc into the inferior aspect of the right neural foramen. Mild right neuroforaminal stenosis. No central canal stenosis. IMPRESSION: 1. Multilevel degenerative disc and joint changes as above. Mild-to-moderate C4-5 and moderate C5-6 central canal stenosis. 2. Moderate C4-5 endplate marrow edema favored to be secondary to degenerative/stress related changes. 3. Multilevel neuroforaminal stenosis including at least moderate bilateral C4-5, at least moderate bilateral C5-6, and moderate to severe left and moderate right C6-7 neuroforaminal stenosis. Electronically Signed   By: Yvonne Kendall M.D.   On: 11/16/2021 18:15   ECHOCARDIOGRAM COMPLETE  Result Date: 11/15/2021    ECHOCARDIOGRAM REPORT   Patient Name:   Kendra Hall Date of Exam: 11/15/2021 Medical Rec #:  [id number]      Height:       63.5 in Accession #:    [id number]     Weight:       215.0 lb Date of Birth:  1966/05/04      BSA:          2.006 m Patient Age:    56 years       BP:           154/69 mmHg Patient Gender: F              HR:           83 bpm. Exam Location:  Inpatient Procedure: 2D Echo, Cardiac Doppler and Color Doppler Indications:    TIA  History:        Patient has no prior history of Echocardiogram examinations.                 Risk Factors:Hypertension, Diabetes and Sleep Apnea.  Sonographer:    Jefferey Pica Referring Phys: [place]  1. Left ventricular ejection fraction, by estimation, is 70 to 75%. Left ventricular ejection fraction by PLAX is 74 %. The left ventricle has hyperdynamic function. The left ventricle has no regional wall motion abnormalities. There is mild left ventricular hypertrophy. Left ventricular diastolic parameters are consistent with Grade I diastolic dysfunction (impaired relaxation).  2. Right  ventricular systolic function is normal. The right ventricular size is normal. Tricuspid regurgitation signal is inadequate for assessing PA pressure.  3. The mitral valve is abnormal. Trivial mitral valve regurgitation.  4. The aortic valve is tricuspid. Aortic valve regurgitation is not visualized.  5. The  inferior vena cava is normal in size with <50% respiratory variability, suggesting right atrial pressure of 8 mmHg. Comparison(s): No prior Echocardiogram. FINDINGS  Left Ventricle: Left ventricular ejection fraction, by estimation, is 70 to 75%. Left ventricular ejection fraction by PLAX is 74 %. The left ventricle has hyperdynamic function. The left ventricle has no regional wall motion abnormalities. The left ventricular internal cavity size was normal in size. There is mild left ventricular hypertrophy. Left ventricular diastolic parameters are consistent with Grade I diastolic dysfunction (impaired relaxation). Indeterminate filling pressures. Right Ventricle: The right ventricular size is normal. No increase in right ventricular wall thickness. Right ventricular systolic function is normal. Tricuspid regurgitation signal is inadequate for assessing PA pressure. Left Atrium: Left atrial size was normal in size. Right Atrium: Right atrial size was normal in size. Pericardium: There is no evidence of pericardial effusion. Mitral Valve: The mitral valve is abnormal. There is mild calcification of the anterior and posterior mitral valve leaflet(s). Trivial mitral valve regurgitation. Tricuspid Valve: The tricuspid valve is grossly normal. Tricuspid valve regurgitation is trivial. Aortic Valve: The aortic valve is tricuspid. Aortic valve regurgitation is not visualized. Aortic valve peak gradient measures 7.6 mmHg. Pulmonic Valve: The pulmonic valve was normal in structure. Pulmonic valve regurgitation is not visualized. Aorta: The aortic root and ascending aorta are structurally normal, with no evidence of  dilitation. Venous: The inferior vena cava is normal in size with less than 50% respiratory variability, suggesting right atrial pressure of 8 mmHg. IAS/Shunts: The interatrial septum was not well visualized.  LEFT VENTRICLE PLAX 2D LV EF:         Left            Diastology                ventricular     LV e' medial:    5.78 cm/s                ejection        LV E/e' medial:  15.1                fraction by     LV e' lateral:   7.43 cm/s                PLAX is 74      LV E/e' lateral: 11.8                %. LVIDd:         4.20 cm LVIDs:         2.40 cm LV PW:         1.20 cm LV IVS:        1.10 cm LVOT diam:     1.90 cm LV SV:         57 LV SV Index:   28 LVOT Area:     2.84 cm  IVC IVC diam: 1.80 cm LEFT ATRIUM             Index        RIGHT ATRIUM          Index LA diam:        3.60 cm 1.79 cm/m   RA Area:     7.18 cm LA Vol (A2C):   42.2 ml 21.04 ml/m  RA Volume:   11.90 ml 5.93 ml/m LA Vol (A4C):   38.9 ml 19.39 ml/m LA Biplane Vol: 40.9 ml 20.39 ml/m  AORTIC VALVE  PULMONIC VALVE AV Area (Vmax): 2.27 cm     PV Vmax:       0.96 m/s AV Vmax:        137.50 cm/s  PV Peak grad:  3.7 mmHg AV Peak Grad:   7.6 mmHg LVOT Vmax:      110.00 cm/s LVOT Vmean:     66.500 cm/s LVOT VTI:       0.201 m  AORTA Ao Root diam: 2.80 cm Ao Asc diam:  2.70 cm MITRAL VALVE MV Area (PHT): 3.89 cm    SHUNTS MV Decel Time: 195 msec    Systemic VTI:  0.20 m MV E velocity: 87.50 cm/s  Systemic Diam: 1.90 cm MV A velocity: 85.10 cm/s MV E/A ratio:  1.03 Lyman Bishop MD Electronically signed by Lyman Bishop MD Signature Date/Time: 11/15/2021/5:03:04 PM    Final      Today   Subjective    Kendra Hall today has no headache,no chest abdominal pain,no new weakness tingling or numbness, feels much better wants to go home today.    Objective   Blood pressure 128/68, pulse 87, temperature 98 F (36.7 C), temperature source Oral, resp. rate 20, height 5' 3.5" (1.613 m), weight 97.5 kg, last menstrual period  12/06/2016, SpO2 100 %.  No intake or output data in the 24 hours ending 11/17/21 1053  Exam  Awake Alert, No new F.N deficits,    [place].AT,PERRAL Supple Neck,   Symmetrical Chest wall movement, Good air movement bilaterally, CTAB RRR,No Gallops,   +ve B.Sounds, Abd Soft, Non tender,  No Cyanosis, Clubbing or edema    Data Review   Recent Labs  Lab 11/15/21 0925  WBC 7.5  HGB 14.8  HCT 45.0  PLT 306  MCV 82.6  MCH 27.2  MCHC 32.9  RDW 14.4  LYMPHSABS 2.2  MONOABS 0.6  EOSABS 0.1  BASOSABS 0.0    Recent Labs  Lab 11/15/21 0925 11/15/21 0950 11/16/21 0202  NA 141  --   --   K 4.0  --   --   CL 105  --   --   CO2 27  --   --   GLUCOSE 92  --   --   BUN 11  --   --   CREATININE 0.81  --   --   CALCIUM 9.2  --   --   AST 42*  --   --   ALT 58*  --   --   ALKPHOS 286*  --   --   BILITOT 0.7  --   --   ALBUMIN 3.4*  --   --   INR 1.0  --   --   TSH  --  <0.010* 0.014*   Lab Results  Component Value Date   HGBA1C 11.0 (A) 10/18/2021   Lab Results  Component Value Date   CHOL 102 11/16/2021   HDL 34 (L) 11/16/2021   LDLCALC 28 11/16/2021   LDLDIRECT 60 11/08/2011   TRIG 199 (H) 11/16/2021   CHOLHDL 3.0 11/16/2021    Total Time in preparing paper work, data evaluation and todays exam - 35 minutes  Lala Lund M.D on 11/17/2021 at 10:53 AM  [facility]

## 2021-11-17 NOTE — Progress Notes (Signed)
Discharge paperwork reviewed with pt. Pt verbalized understanding. Pt's daughter will transport pt home. Pt does not have any clothes here. Daughter to bring pt's clothes then transport her home.

## 2021-11-18 NOTE — Progress Notes (Signed)
Per Mable Paris, RN Explanation for insulin dose documentation from 5/26 evening:  It wasn't given at the scheduled time because it was not available as I charted. When I did receive the insulin it was 55 units and the dose was for 50 units therefore I had 2 witnesses to verify the waste of 5 units and then the patient got it.

## 2021-11-20 NOTE — Telephone Encounter (Signed)
Pt is sch for 11-28-21 with Tomi Likens

## 2021-11-27 NOTE — Progress Notes (Unsigned)
NEUROLOGY FOLLOW UP OFFICE NOTE  Kendra Hall [id number]  Assessment/Plan:   1.  Episode of left sided weakness and slurred speech.  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   2.  Left sided cervical radiculopathy - May be attributed to findings on cervical spine MRI.  Not an ongoing problem and therefore treatment not currently indicated.   3.  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  Migraine without aura, without status migrainosus, not intractable - controlled 5.  Uncontrolled type 2 diabetes mellitus 6.  Hypertension 7.  OSA  Migraine prevention:  Emgality every 4 weeks, zonidamide '50mg'$  daily Migraine rescue:  will have her try samples of Ubrelvy.  Due to cerebrovascular disease/history of TIA vs possible hemiplegic migraine, triptans are contraindicated Once finished with Plavix, continue ASA '81mg'$  monotherapy daily Continue management of stroke risk factors as per PCP: Statin.  LDL goal less than 70 Normotensive blood pressure Optimize glycemic control.  Supposed to see endocrinology Continue management of sleep apnea Follow up for outpatient PT/OT (scheduled) Follow up 4 months.    Subjective:  Kendra Hall is a 56 year old woman with type 2 diabetes mellitus, hypothyroidism, fibromyalgia and depression who follows up for stroke-like event.  MRI brain, MRI C-spine and CTA head and neck personally reviewed.   UPDATE: Admitted to hospital on 11/15/2021 for another episode of slurred speech and nausea.  Also endorsed neck pain and numbness radiating down left arm.  Left leg seemed slightly weak on exam.  Symptoms lasted a couple of hours.  MRI of brain revealed stable chronic small  vessel ischemic changes but no acute stroke.  CTA of head and neck showed moderate focal narrowing of right V2 vertebral artery due to mass effect from degenerative bony spurring but no significant intracranial or extracranial stenosis or occlusion.  2D echocardiogram showed EF 70-75%.  LDL was 28.  She had a recent Hgb A1c in April that was 11.  TIA suspected.  In addition to ASA, she was placed on Plavix for 21 days followed again by ASA monotherapy.  Due to neck pain and reported left sided pain and weakness, she had an MRI of the cervical spine which revealed moderate spinal stenosis at C5-6 by no severe stenosis or cord abnormality.   Intensity:  Moderate to severe Duration:  1 to 2 hours Frequency:  1 to 2 days a month Current NSAIDS:  none Current analgesics:  Hydrocodone most days for fibromyalgia but has been treating headaches with them as well Current triptans: none Current ergotamine: None Current anti-emetic: Promethazine 12.'5mg'$  Current muscle relaxants:  baclofen Current anti-anxiolytic:  BuSpar Current sleep aide: None Current Antihypertensive medications: Would not use beta-blocker due to asthma Current Antidepressant medications:  Cymbalta '60mg'$  BID Current Anticonvulsant medications: zonisamide '50mg'$  at bedtime, Lyrica '100mg'$  BID Current anti-CGRP:  Emgality  Current Vitamins/Herbal/Supplements: None Current Antihistamines/Decongestants: None Other therapy: None   Caffeine: No Diet: Drinks Exercise: Not routine Depression: Yes; Anxiety: Yes Other pain: Neck pain, fibromyalgia Sleep hygiene: She underwent sleep study and was diagnosed with severe OSA.  She is using a CPAP.  Sleep is improved   HISTORY: Onset: She has had history of migraines since her 16s, but they became frequent in 2018. Location:  Varies: back of head, across forehead, temples Quality:  Varies: squeezing, sharp Initial Intensity:  10/10 Aura:  no Prodrome:  no Postdrome:  no Associated symptoms:  Nausea, photophobia, phonophobia.  She denies associated vomiting, visual disturbance, or unilateral numbness or weakness.  It is not a new thunderclap headache. Initial Duration:  All day (usually lets up in 30 minutes after taking Maxalt, however lately headache returns in 2 hours) Initial Frequency:  Every other day Initial Frequency of abortive medication: every other day Triggers:  None Relieving factors: Sleep Activity:  aggravates   History of recurrent stroke-like episodes with negative imaging.  In 2016, she had an episode of slurred speech with left sided weakness.  In 2019, she had an episode of left sided facial numbness.  In March 2022, she had twitching on the left side of her mouth lasting a second and intermittent slurred speech.  No unilateral numbness, weakness or headache.  CT head unremarkable.  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  Following this event, she had another episode of speech disturbance, trouble saying the correct word.   A couple of nights ago, she was experiencing a paroxysmal shooting pain from crown down the right occipital region for a couple of seconds.   Past NSAIDS/steroids:  ibuprofen, prednisone (unable to take0 Past analgesics:  Tylenol, Excedrin Migraine Past abortive triptans:  Sumatriptan tablet, rizatriptan Past muscle relaxants:  no Past anti-emetic:  Zofran Past antihypertensive medications:  no Past antidepressant medications:  sertraline '50mg'$ , nortriptyline '75mg'$  as needed for sleep (cannot take nightly due to extreme dry mouth), Lexapro '20mg'$  Past anticonvulsant medications:  topiramate immediate release '100mg'$  (cognitive problems), Topiramate ER 100 mg (effective) Past vitamins/Herbal/Supplements:  no Other past therapies:  no   Family history of headache:  cousins   MRI and MRA of head from 03/15/15 were  personally reviewed and were unremarkable except for minimal punctate white matter foci.   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  CT cervical spine from 01/09/18 showed cervical spondylosis with degenerative changes greatest at C4-C7 levels with bilateral neural foraminal stenosis, as well as mild multifactorial C5-6 canal stenosis.  She is treated by pain management.  Endorses numbness and tingling in hands.  NCV-EMG of upper extremities in June 2022 revealed mild carpal tunnel syndrome.    PAST MEDICAL HISTORY: Past Medical History:  Diagnosis Date   Allergy    Anemia    history of   Anxiety    Asthma    Depression    Diabetes mellitus    type 2    Fibromyalgia    Gastric ulcer    GERD (gastroesophageal reflux disease)    Graves disease    H/O therapeutic radiation    Migraine    Multiple thyroid nodules    Myofacial muscle pain    Osteoarthritis    Facet hypertrophy with injections   TIA (transient ischemic attack)    02/2015    MEDICATIONS: Current Outpatient Medications on File Prior to Visit  Medication Sig Dispense Refill   acetaminophen (TYLENOL) 500 MG tablet Take 1,000 mg by mouth every 6 (six) hours as needed for moderate pain.     albuterol (PROAIR HFA) 108 (90 Base) MCG/ACT inhaler Inhale 2 puffs into the lungs every 4 (four) hours as needed for wheezing. 1 Inhaler 2   albuterol (PROVENTIL) (2.5 MG/3ML) 0.083%  nebulizer solution Take 3 mLs by nebulization 4 (four) times daily as needed for wheezing or shortness of breath.   3   amLODipine (NORVASC) 10 MG tablet Take 10 mg by mouth daily.     aspirin EC 81 MG tablet Take 1 tablet (81 mg total) by mouth daily. Swallow whole. 30 tablet 11   atorvastatin (LIPITOR) 20 MG tablet Take 20 mg by mouth every evening.     benzonatate (TESSALON) 200 MG capsule Take 200 mg by mouth 3 (three) times daily as needed for cough.     clopidogrel (PLAVIX) 75 MG tablet Take 1 tablet (75 mg  total) by mouth daily. 20 tablet 0   DULoxetine (CYMBALTA) 60 MG capsule Take 60 mg by mouth 2 (two) times daily.     EMGALITY 120 MG/ML SOAJ ADMINISTER 1 ML UNDER THE SKIN EVERY 30 DAYS (Patient taking differently: Inject 1 mL into the skin every 30 (thirty) days.) 1 mL 1   famciclovir (FAMVIR) 500 MG tablet Take 500 mg by mouth 2 (two) times daily.     famotidine (PEPCID) 20 MG tablet Take 1 tablet (20 mg total) by mouth every evening. 30 tablet 0   glucose blood (ONETOUCH VERIO) test strip CHECK YOUR BLOOD SUGAR ONCE DAILY(VARY THE TIME OF DAY WHEN YOU CHECK BETWEEN, BEFORE THE 3 MEALS, AND AT BEDTIME) 200 strip 1   hydrOXYzine (VISTARIL) 25 MG capsule Take 25 mg by mouth 3 (three) times daily as needed for itching.     insulin glargine, 2 Unit Dial, (TOUJEO MAX SOLOSTAR) 300 UNIT/ML Solostar Pen Inject 110 Units into the skin every morning. 36 mL 3   Insulin Pen Needle (BD PEN NEEDLE NANO 2ND GEN) 32G X 4 MM MISC USE TO INJECT LANTUS UNDER THE SKIN EVERY MORNING 100 each 3   lamoTRIgine (LAMICTAL) 150 MG tablet Take 150 mg by mouth at bedtime.     levothyroxine (SYNTHROID) 112 MCG tablet Take 1 tablet (112 mcg total) by mouth daily. 180 tablet 1   linaclotide (LINZESS) 290 MCG CAPS capsule Take 290 mcg by mouth daily before breakfast.      Magnesium 250 MG TABS Take 750 mg by mouth at bedtime.     mirtazapine (REMERON) 7.5 MG tablet Take 7.5 mg by mouth at bedtime.     montelukast (SINGULAIR) 10 MG tablet Take 10 mg by mouth daily.   3   nicotine (NICODERM CQ - DOSED IN MG/24 HOURS) 14 mg/24hr patch Place 1 patch (14 mg total) onto the skin daily. 26 patch 0   promethazine (PHENERGAN) 12.5 MG tablet Take 12.5 mg by mouth every 6 (six) hours as needed for nausea or vomiting.     rizatriptan (MAXALT-MLT) 10 MG disintegrating tablet DISSOLVE 1 TABLET ON THE TONGUE AT ONSET OF HEADACHE. MAY REPEAT 1 TIME IN 2 HOURS AS NEEDED (Patient taking differently: Take 10 mg by mouth as needed for migraine.  May repeat 1 time in 2 hours) 9 tablet 3   No current facility-administered medications on file prior to visit.    ALLERGIES: Allergies  Allergen Reactions   Gabapentin Other (See Comments)    unknown   Eggs Or Egg-Derived Products Other (See Comments)    Don't eat eggs   Ergotrate [Ergonovine] Other (See Comments)    Pt does not ever want   Ibuprofen Other (See Comments)    Patient has ulcer   Latex Other (See Comments)    Breaks out   Metformin And Related Other (See  Comments)    Dizziness   Yutopar [Ritodrine] Other (See Comments)    Pt does not ever want    FAMILY HISTORY: Family History  Problem Relation Age of Onset   Hypertension Mother    Diabetes Mother    Cancer Mother        rectal   Stroke Father    Heart disease Father 72       MI x5   Hypertension Father    Diabetes Father    Breast cancer Sister        unsure of age , was between 54 and 67   Drug abuse Brother    Alcohol abuse Brother    Drug abuse Brother    Alcohol abuse Brother    Anxiety disorder Neg Hx    Bipolar disorder Neg Hx    Depression Neg Hx       Objective:  Blood pressure (!) 141/83, pulse 97, resp. rate (!) 99, height '5\' 4"'$  (1.626 m), weight 213 lb (96.6 kg), last menstrual period 12/06/2016. General: No acute distress.  Patient appears well-groomed.   Head:  Normocephalic/atraumatic Eyes:  Fundi examined but not visualized Neck: supple, bilateral paraspinal tenderness, full range of motion Heart:  Regular rate and rhythm Lungs:  Clear to auscultation bilaterally Back: No paraspinal tenderness Neurological Exam: alert and oriented to person, place, and time.  Speech fluent and not dysarthric, language intact.  Decreased left V1-V3.  Otherwise, CN II-XII intact. Bulk and tone normal, muscle strength 5- left upper and lower extremities, 5/5 on right  Sensation to pinprick and vibration intact.  Deep tendon reflexes 2+ throughout, toes downgoing.  Finger to nose testing intact.   Gait normal, Romberg negative.   Metta Clines, DO  CC: Simona Huh, NP

## 2021-11-28 ENCOUNTER — Encounter: Payer: Self-pay | Admitting: Neurology

## 2021-11-28 ENCOUNTER — Ambulatory Visit (INDEPENDENT_AMBULATORY_CARE_PROVIDER_SITE_OTHER): Payer: 59 | Admitting: Neurology

## 2021-11-28 VITALS — BP 141/83 | HR 97 | Resp 99 | Ht 64.0 in | Wt 213.0 lb

## 2021-11-28 DIAGNOSIS — G459 Transient cerebral ischemic attack, unspecified: Secondary | ICD-10-CM | POA: Diagnosis not present

## 2021-11-28 DIAGNOSIS — G43409 Hemiplegic migraine, not intractable, without status migrainosus: Secondary | ICD-10-CM | POA: Diagnosis not present

## 2021-11-28 DIAGNOSIS — G4733 Obstructive sleep apnea (adult) (pediatric): Secondary | ICD-10-CM

## 2021-11-28 DIAGNOSIS — E1165 Type 2 diabetes mellitus with hyperglycemia: Secondary | ICD-10-CM

## 2021-11-28 DIAGNOSIS — M5412 Radiculopathy, cervical region: Secondary | ICD-10-CM

## 2021-11-28 DIAGNOSIS — I10 Essential (primary) hypertension: Secondary | ICD-10-CM

## 2021-11-28 DIAGNOSIS — G43009 Migraine without aura, not intractable, without status migrainosus: Secondary | ICD-10-CM

## 2021-11-28 MED ORDER — ZONISAMIDE 50 MG PO CAPS
50.0000 mg | ORAL_CAPSULE | Freq: Every day | ORAL | 5 refills | Status: DC
Start: 2021-11-28 — End: 2022-05-01

## 2021-11-28 NOTE — Patient Instructions (Signed)
At earliest onset of migraine, take Ubrelvy '100mg'$ .  May repeat after 2 hours.  Maximum 2 tablets in 24 hours.  Let me know if effective. Continue Emgality and zonisamide Once you finish clopidogrel, stop it.  But continue aspirin '81mg'$  daily Follow up 4 months.

## 2021-11-28 NOTE — Progress Notes (Signed)
Medication Samples have been provided to the patient.  Drug name: Roselyn Meier     Strength: '100mg'$         Qty: 4  LOT: [id number]  Exp.Date: 06/24  Dosing instructions: as needed  The patient has been instructed regarding the correct time, dose, and frequency of taking this medication, including desired effects and most common side effects.   Renae Gloss 9:25 AM 11/28/2021

## 2021-12-03 ENCOUNTER — Telehealth: Payer: Self-pay | Admitting: Neurology

## 2021-12-03 NOTE — Telephone Encounter (Signed)
Patient called in, another doctor is wanting to put her on atorvastatin, wants to know if that will be okay

## 2021-12-03 NOTE — Telephone Encounter (Signed)
Called patient and let her know Dr. Tomi Likens that Dr. Ivar Drape is fine to take

## 2021-12-03 NOTE — Therapy (Signed)
OUTPATIENT PHYSICAL THERAPY NEURO EVALUATION   Patient Name: Kendra Hall MRN: [id number] DOB:22-Jul-1965, 56 y.o., female Today's Date: 12/05/2021   PCP: Simona Huh, NP REFERRING PROVIDER: Thurnell Lose, MD    PT End of Session - 12/05/21 1702     Visit Number 1    Number of Visits 7    Date for PT Re-Evaluation 01/16/22    Authorization Type [employer]    PT Start Time 1622    PT Stop Time 1700    PT Time Calculation (min) 38 min    Activity Tolerance Patient tolerated treatment well    Behavior During Therapy WFL for tasks assessed/performed             Past Medical History:  Diagnosis Date   Allergy    Anemia    history of   Anxiety    Asthma    Depression    Diabetes mellitus    type 2    Fibromyalgia    Gastric ulcer    GERD (gastroesophageal reflux disease)    Graves disease    H/O therapeutic radiation    Migraine    Multiple thyroid nodules    Myofacial muscle pain    Osteoarthritis    Facet hypertrophy with injections   TIA (transient ischemic attack)    02/2015   Past Surgical History:  Procedure Laterality Date   AXILLARY LYMPH NODE DISSECTION  2001   CHOLECYSTECTOMY N/A 09/03/2018   Procedure: LAPAROSCOPIC CHOLECYSTECTOMY;  Surgeon: Coralie Keens, MD;  Location: [facility];  Service: General;  Laterality: N/A;   [place] Left 03/11/2017   Procedure: HYSTERECTOMY VAGINAL W/LEFT PROXIMAL SALPINGECTOMY;  Surgeon: Princess Bruins, MD;  Location: [facility];  Service: Gynecology;  Laterality: Left;  request 7:30am OR time  request 2 hours     Patient Active Problem List   Diagnosis Date Noted   Stroke-like symptoms 11/15/2021   Dyslipidemia 11/15/2021   Cervical stenosis of spine 11/15/2021   Severe obstructive sleep apnea-hypopnea syndrome 03/22/2020   Sleep-related hypoxia 03/22/2020   Intractable episodic cluster headache 02/10/2020   Abnormal dreams 02/10/2020   Excessive  daytime sleepiness 02/10/2020   Snoring 02/10/2020   Sleep related headaches 02/10/2020   Lt facial numbness 09/25/2017   Back pain 06/15/2017   Postoperative state 03/11/2017   Trigger point 11/28/2016   Health care maintenance 02/04/2016   Severe episode of recurrent major depressive disorder, with psychotic features ([place]) 08/22/2015   GAD (generalized anxiety disorder) 08/22/2015   PTSD (post-traumatic stress disorder) 08/22/2015   Insomnia 08/22/2015   GERD (gastroesophageal reflux disease) 04/18/2015   Fibromyalgia 03/29/2015   Hypothyroidism    Type 2 diabetes mellitus without complication ([place])    TIA (transient ischemic attack) 03/15/2015   Mild intermittent asthma 02/28/2012   Anxiety disorder 02/07/2012   Hypertension 01/07/2012   Hypothyroidism following radioiodine therapy 08/28/2011   Grave's disease 05/07/2011   Family hx-breast malignancy 11/20/2010   Chronic migraine without aura 05/04/2010   Left shoulder pain 09/08/2009   TOBACCO USER 03/11/2009   Notalgia 01/09/2009   Class 2 obesity due to excess calories with body mass index (BMI) of 37.0 to 37.9 in adult 09/27/2008   Mood disorder ([place]) 09/22/2008    ONSET DATE: 11/15/21  REFERRING DIAG: R47.81 (ICD-10-CM) - Slurred speech R29.90 (ICD-10-CM) - Stroke-like symptoms   THERAPY DIAG:  Muscle weakness (generalized)  Other abnormalities of gait and mobility  Muscle  spasm of back  Rationale for Evaluation and Treatment Rehabilitation  SUBJECTIVE:                                                                                                                                                                                              SUBJECTIVE STATEMENT: Patient reports that she had a suspected TIA on 05/25. Since then, having trouble concentrating, difficulty with word finding. Denies N/T. Reports that her daughter and grand kids notice that she isn't walking straight and leans side to side when walking.  Reports occasional heaviness in the L LE. Denies dizziness or changes in vision. Reports tightness in B shoulders and chronic LBP for years- worse when standing up from prolonged sitting).  Pt accompanied by: self  PERTINENT HISTORY: anemia, asthma, anxiety, depression, DM, fibromyalgia, GERD, migraines, TIA  PAIN:  Are you having pain? No  PRECAUTIONS: Fall  WEIGHT BEARING RESTRICTIONS No  FALLS: Has patient fallen in last 6 months? Yes. 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  [employer] work on muscle tightness   OBJECTIVE:   DIAGNOSTIC FINDINGS: 11/16/21 cervical MRI: mild-moderate C4-5 and C5-6 central canal stenosis, moderate-severe C4-5, C5-6, C6-7 neuroforaminal stenosis   11/15/21 brain MRI: Motion degraded examination without evidence of acute intracranial abnormality  COGNITION: Overall cognitive status: No family/caregiver present to determine baseline cognitive functioning   SENSATION: WFL  COORDINATION: Alt pronation/supination slightly dysmetric on L hand; B finger to nose and heel to shin normal   MUSCLE TONE: LLE: Within functional limits   POSTURE: rounded shoulders and increased lumbar lordosis   LOWER EXTREMITY ROM:     Active  Right Eval Left Eval  Hip flexion    Hip extension    Hip abduction    Hip adduction    Hip internal rotation    Hip external rotation    Knee flexion    Knee extension    Ankle dorsiflexion 24 20  Ankle plantarflexion    Ankle inversion    Ankle eversion     (Blank rows = not tested)  Active   Eval *pain with all movements  lumbar flexion Mid shin  Lumbar extension To neutral  R SBing Distal tibia  L SBing Distal tibia  R  rotation 75% limited  L rotation 75% limited   (Blank rows = not tested)  *midline lumbar spine and L  superior glute TTP; increased soft tissue restriction in B lumbar paraspinals, glutes and piriformis   LOWER EXTREMITY MMT:    MMT (in sitting) Right Eval Left Eval  Hip flexion 5 4-  Hip extension    Hip abduction 4+ 4  Hip adduction 4+ 4  Hip internal rotation    Hip external rotation    Knee flexion 4+ 4  Knee extension 4+ 4  Ankle dorsiflexion 5 4  Ankle plantarflexion 4+ 4  Ankle inversion    Ankle eversion    (Blank rows = not tested)  GAIT: Gait pattern:   , step to pattern, step through pattern, decreased step length- Right, and decreased stance time- Left Assistive device utilized: None Level of assistance: Complete Independence   FUNCTIONAL TESTs:  5 times sit to stand: 15.23 sec without Ues; unable to fully stand each time  Timed up and go (TUG): 15.87 sec   PATIENT SURVEYS:  FOTO next session    PATIENT EDUCATION: Education details: prognosis, POC, HEP Person educated: Patient Education method: Explanation, Demonstration, and Handouts Education comprehension: verbalized understanding   HOME EXERCISE PROGRAM: Access Code: [email or web address] URL: [email or web address] Date: 12/05/2021 Prepared by: [facility]  Exercises - Marching with Resistance  - 1 x daily - 5 x weekly - 2 sets - 10 reps - Standing Hip Abduction with Counter Support  - 1 x daily - 5 x weekly - 2 sets - 10 reps - Sit to Stand Without Arm Support  - 1 x daily - 5 x weekly - 2 sets - 10 reps - Heel Toe Raises with Unilateral Counter Support  - 1 x daily - 5 x weekly - 2 sets - 10 reps   GOALS: Goals reviewed with patient? Yes  SHORT TERM GOALS: Target date: 12/19/2021  Patient to be independent with initial HEP. Baseline: HEP initiated Goal status: INITIAL    LONG TERM GOALS: Target date: 01/16/2022  Patient to be independent  with advanced HEP. Baseline: Not yet initiated  Goal status: INITIAL  Patient to demonstrate B LE strength >/=4+/5.  Baseline: See above Goal status: INITIAL  Patient to demonstrate lumbar AROM WFL and without pain limiting.  Baseline: see above Goal status: INITIAL  Patient to report and demonstrate improved head, neck, and shoulder posture at rest and with activity.  Baseline: see above Goal status: INITIAL  Patient to complete TUG in <14 sec with LRAD in order to decrease risk of falls.   Baseline: 15.87 sec Goal status: INITIAL  Patient to demonstrate 5xSTS test in <15 sec in order to decrease risk of falls.  Baseline: 15.23 sec Goal status: INITIAL  Patient to score at least 46/56 on Berg in order to decrease risk of falls.  Baseline: NT Goal status: INITIAL    ASSESSMENT:  CLINICAL IMPRESSION:  Patient is a 56 y/o F presenting to OPPT with c/o L LE weakness and gait deviations s/p hospital admission on 11/15/21 for stroke-like symptoms. Patient also with c/o chronic LBP and neck tightness which limits walking and may limit tolerance for sessions, thus plan to address this as well. Patient today presenting with rounded posture with increased lumbar lordosis, limited and painful lumbar AROM, TTP in midline lumbar spine and L superior glute TTP, increased soft tissue restriction in B lumbar paraspinals, glutes and piriformis, gross L LE weakness, gait deviations, and increased time for transfers/gait. Patient was educated on gentle strengthening HEP and reported understanding. Would benefit from skilled PT  services 1 x/week for 6 weeks to address aforementioned impairments in order to optimize level of function.     OBJECTIVE IMPAIRMENTS Abnormal gait, decreased activity tolerance, decreased balance, decreased coordination, difficulty walking, decreased ROM, decreased strength, impaired flexibility, improper body mechanics, postural dysfunction, and pain.   ACTIVITY  LIMITATIONS carrying, lifting, bending, standing, squatting, stairs, and dressing  PARTICIPATION LIMITATIONS: meal prep, cleaning, laundry, shopping, community activity, and church  PERSONAL FACTORS Age, Fitness, Past/current experiences, Time since onset of injury/illness/exacerbation, and 3+ comorbidities: anemia, asthma, anxiety, depression, DM, fibromyalgia, GERD, migraines, TIA  are also affecting patient's functional outcome.   REHAB POTENTIAL: Good  CLINICAL DECISION MAKING: Evolving/moderate complexity  EVALUATION COMPLEXITY: Moderate  PLAN: PT FREQUENCY: 1x/week  PT DURATION: 6 weeks  PLANNED INTERVENTIONS: Therapeutic exercises, Therapeutic activity, Neuromuscular re-education, Balance training, Gait training, Patient/Family education, Joint mobilization, Stair training, Vestibular training, Canalith repositioning, Dry Needling, Electrical stimulation, Cryotherapy, Moist heat, Taping, Manual therapy, and Re-evaluation  PLAN FOR NEXT SESSION: FOTO, assess Berg, progress L LE strengthening, hip and lumbar mobility    Janene Harvey, PT, DPT 12/05/21 5:13 PM  [facility] at [facility] [address] [place], [place] [address] Phone # [phone] Fax # [phone]

## 2021-12-05 ENCOUNTER — Ambulatory Visit: Payer: 59 | Attending: Internal Medicine | Admitting: Physical Therapy

## 2021-12-05 ENCOUNTER — Encounter: Payer: Self-pay | Admitting: Physical Therapy

## 2021-12-05 DIAGNOSIS — R2990 Unspecified symptoms and signs involving the nervous system: Secondary | ICD-10-CM | POA: Insufficient documentation

## 2021-12-05 DIAGNOSIS — M6281 Muscle weakness (generalized): Secondary | ICD-10-CM | POA: Diagnosis present

## 2021-12-05 DIAGNOSIS — M62830 Muscle spasm of back: Secondary | ICD-10-CM | POA: Insufficient documentation

## 2021-12-05 DIAGNOSIS — R4781 Slurred speech: Secondary | ICD-10-CM | POA: Diagnosis not present

## 2021-12-05 DIAGNOSIS — R2689 Other abnormalities of gait and mobility: Secondary | ICD-10-CM | POA: Diagnosis present

## 2021-12-10 NOTE — Progress Notes (Unsigned)
Patient ID: Kendra Hall, female   DOB: [birth year], 56 y.o.   MRN: [id number]           Reason for Appointment: Type II Diabetes follow-up   History of Present Illness   Diagnosis date:   Previous history:  Oral hypoglycemic drugs previously used are: Insulin was started in  A1c range in the last few years is:  Recent history:     Non-insulin hypoglycemic drugs:      Insulin regimen:            Side effects from medications: None  Current self management, blood sugar patterns and problems identified:  A1c is  Exercise: Diet management:      Monitors blood glucose: Once a day.    Glucometer: One Touch.           Blood Glucose readings from meter download:   PRE-MEAL Fasting Lunch Dinner Bedtime Overall  Glucose range:       Mean/median:        POST-MEAL PC Breakfast PC Lunch PC Dinner  Glucose range:     Mean/median:        Hypoglycemia:  none                        Dietician visit: Most recent:      Weight control:  Wt Readings from Last 3 Encounters:  11/28/21 213 lb (96.6 kg)  11/15/21 215 lb (97.5 kg)  10/18/21 214 lb 3.2 oz (97.2 kg)            Diabetes labs:  Lab Results  Component Value Date   HGBA1C 11.0 (A) 10/18/2021   HGBA1C 12.2 (A) 08/09/2021   HGBA1C 9.7 (A) 03/26/2021   Lab Results  Component Value Date   LDLCALC 28 11/16/2021   CREATININE 0.81 11/15/2021     Allergies as of 12/11/2021       Reactions   Gabapentin Other (See Comments)   unknown   Eggs Or Egg-derived Products Other (See Comments)   Don't eat eggs   Ergotrate [ergonovine] Other (See Comments)   Pt does not ever want   Ibuprofen Other (See Comments)   Patient has ulcer   Latex Other (See Comments)   Breaks out   Metformin And Related Other (See Comments)   Dizziness   Yutopar [ritodrine] Other (See Comments)   Pt does not ever want        Medication List        Accurate as of December 10, 2021  8:52 PM. If you have any questions, ask your nurse  or doctor.          acetaminophen 500 MG tablet Commonly known as: TYLENOL Take 1,000 mg by mouth every 6 (six) hours as needed for moderate pain.   albuterol 108 (90 Base) MCG/ACT inhaler Commonly known as: ProAir HFA Inhale 2 puffs into the lungs every 4 (four) hours as needed for wheezing.   albuterol (2.5 MG/3ML) 0.083% nebulizer solution Commonly known as: PROVENTIL Take 3 mLs by nebulization 4 (four) times daily as needed for wheezing or shortness of breath.   amLODipine 10 MG tablet Commonly known as: NORVASC Take 10 mg by mouth daily.   aspirin EC 81 MG tablet Take 1 tablet (81 mg total) by mouth daily. Swallow whole.   atorvastatin 20 MG tablet Commonly known as: LIPITOR Take 20 mg by mouth every evening.   BD Pen Needle Nano 2nd Gen 32G X 4  MM Misc Generic drug: Insulin Pen Needle USE TO INJECT LANTUS UNDER THE SKIN EVERY MORNING   benzonatate 200 MG capsule Commonly known as: TESSALON Take 200 mg by mouth 3 (three) times daily as needed for cough.   clopidogrel 75 MG tablet Commonly known as: PLAVIX Take 1 tablet (75 mg total) by mouth daily.   DULoxetine 60 MG capsule Commonly known as: CYMBALTA Take 60 mg by mouth 2 (two) times daily.   Emgality 120 MG/ML Soaj Generic drug: Galcanezumab-gnlm ADMINISTER 1 ML UNDER THE SKIN EVERY 30 DAYS What changed: See the new instructions.   famciclovir 500 MG tablet Commonly known as: FAMVIR Take 500 mg by mouth 2 (two) times daily.   famotidine 20 MG tablet Commonly known as: PEPCID Take 1 tablet (20 mg total) by mouth every evening.   hydrOXYzine 25 MG capsule Commonly known as: VISTARIL Take 25 mg by mouth 3 (three) times daily as needed for itching.   lamoTRIgine 150 MG tablet Commonly known as: LAMICTAL Take 200 mg by mouth at bedtime.   levothyroxine 112 MCG tablet Commonly known as: SYNTHROID Take 1 tablet (112 mcg total) by mouth daily.   linaclotide 290 MCG Caps capsule Commonly known as:  LINZESS Take 290 mcg by mouth daily before breakfast.   Magnesium 250 MG Tabs Take 750 mg by mouth at bedtime.   mirtazapine 7.5 MG tablet Commonly known as: REMERON Take 7.5 mg by mouth at bedtime.   montelukast 10 MG tablet Commonly known as: SINGULAIR Take 10 mg by mouth daily.   nicotine 14 mg/24hr patch Commonly known as: NICODERM CQ - dosed in mg/24 hours Place 1 patch (14 mg total) onto the skin daily.   OneTouch Verio test strip Generic drug: glucose blood CHECK YOUR BLOOD SUGAR ONCE DAILY(VARY THE TIME OF DAY WHEN YOU CHECK BETWEEN, BEFORE THE 3 MEALS, AND AT BEDTIME)   promethazine 12.5 MG tablet Commonly known as: PHENERGAN Take 12.5 mg by mouth every 6 (six) hours as needed for nausea or vomiting.   Toujeo Max SoloStar 300 UNIT/ML Solostar Pen Generic drug: insulin glargine (2 Unit Dial) Inject 110 Units into the skin every morning. What changed: how much to take   zonisamide 50 MG capsule Commonly known as: ZONEGRAN Take 1 capsule (50 mg total) by mouth daily.        Allergies:  Allergies  Allergen Reactions   Gabapentin Other (See Comments)    unknown   Eggs Or Egg-Derived Products Other (See Comments)    Don't eat eggs   Ergotrate [Ergonovine] Other (See Comments)    Pt does not ever want   Ibuprofen Other (See Comments)    Patient has ulcer   Latex Other (See Comments)    Breaks out   Metformin And Related Other (See Comments)    Dizziness   Yutopar [Ritodrine] Other (See Comments)    Pt does not ever want    Past Medical History:  Diagnosis Date   Allergy    Anemia    history of   Anxiety    Asthma    Depression    Diabetes mellitus    type 2    Fibromyalgia    Gastric ulcer    GERD (gastroesophageal reflux disease)    Graves disease    H/O therapeutic radiation    Migraine    Multiple thyroid nodules    Myofacial muscle pain    Osteoarthritis    Facet hypertrophy with injections   TIA (transient ischemic  attack)     02/2015    Past Surgical History:  Procedure Laterality Date   AXILLARY LYMPH NODE DISSECTION  2001   CHOLECYSTECTOMY N/A 09/03/2018   Procedure: LAPAROSCOPIC CHOLECYSTECTOMY;  Surgeon: Coralie Keens, MD;  Location: [facility];  Service: General;  Laterality: N/A;   [place] Left 03/11/2017   Procedure: HYSTERECTOMY VAGINAL W/LEFT PROXIMAL SALPINGECTOMY;  Surgeon: Princess Bruins, MD;  Location: [facility];  Service: Gynecology;  Laterality: Left;  request 7:30am OR time  request 2 hours      Family History  Problem Relation Age of Onset   Hypertension Mother    Diabetes Mother    Cancer Mother        rectal   Stroke Father    Heart disease Father 86       MI x5   Hypertension Father    Diabetes Father    Breast cancer Sister        unsure of age , was between 72 and 4   Drug abuse Brother    Alcohol abuse Brother    Drug abuse Brother    Alcohol abuse Brother    Anxiety disorder Neg Hx    Bipolar disorder Neg Hx    Depression Neg Hx     Social History:  reports that she has been smoking cigarettes. She has a 20.50 pack-year smoking history. She has never used smokeless tobacco. She reports that she does not currently use drugs. She reports that she does not drink alcohol.  Review of Systems:  Last diabetic eye exam date  Last foot exam date:  Symptoms of neuropathy:  Hypertension:   Treatment includes  BP Readings from Last 3 Encounters:  11/28/21 (!) 141/83  11/17/21 128/68  10/22/21 122/60    Lipids:     Lab Results  Component Value Date   CHOL 102 11/16/2021   CHOL 122 (L) 02/02/2016   CHOL 118 03/16/2015   Lab Results  Component Value Date   HDL 34 (L) 11/16/2021   HDL 41 (L) 02/02/2016   HDL 37 (L) 03/16/2015   Lab Results  Component Value Date   LDLCALC 28 11/16/2021   LDLCALC 55 02/02/2016   LDLCALC 58 03/16/2015   Lab Results  Component Value Date   TRIG 199 (H)  11/16/2021   TRIG 131 02/02/2016   TRIG 113 03/16/2015   Lab Results  Component Value Date   CHOLHDL 3.0 11/16/2021   CHOLHDL 3.0 02/02/2016   CHOLHDL 3.2 03/16/2015   Lab Results  Component Value Date   LDLDIRECT 60 11/08/2011   LDLDIRECT 57 11/16/2010     Lab Results  Component Value Date   TSH 0.014 (L) 11/16/2021   TSH <0.010 (L) 11/15/2021   TSH 0.02 (L) 10/18/2021   FREET4 1.61 (H) 11/16/2021   FREET4 1.40 (H) 11/15/2021   FREET4 1.30 10/18/2021   Lab Results  Component Value Date   TSH 0.014 (L) 11/16/2021   TSH <0.010 (L) 11/15/2021   TSH 0.02 (L) 10/18/2021   TSH 0.13 (L) 08/30/2021   TSH 8.20 (H) 03/26/2021   TSH 32.31 (H) 11/22/2020   TSH 44.00 (H) 10/26/2020     Examination:   LMP 12/06/2016 (Approximate)   There is no height or weight on file to calculate BMI.    ASSESSMENT/ PLAN:    Diabetes type 2:   Current regimen:  A1c is  Blood glucose control  There  are no Patient Instructions on file for this visit.   Elayne Snare 12/10/2021, 8:52 PM

## 2021-12-11 ENCOUNTER — Ambulatory Visit (INDEPENDENT_AMBULATORY_CARE_PROVIDER_SITE_OTHER): Payer: 59 | Admitting: Endocrinology

## 2021-12-11 ENCOUNTER — Encounter: Payer: Self-pay | Admitting: Endocrinology

## 2021-12-11 VITALS — BP 132/74 | HR 91 | Ht 64.0 in | Wt 213.0 lb

## 2021-12-11 DIAGNOSIS — E782 Mixed hyperlipidemia: Secondary | ICD-10-CM

## 2021-12-11 DIAGNOSIS — E119 Type 2 diabetes mellitus without complications: Secondary | ICD-10-CM | POA: Diagnosis not present

## 2021-12-11 DIAGNOSIS — Z794 Long term (current) use of insulin: Secondary | ICD-10-CM | POA: Diagnosis not present

## 2021-12-11 DIAGNOSIS — E1165 Type 2 diabetes mellitus with hyperglycemia: Secondary | ICD-10-CM

## 2021-12-11 DIAGNOSIS — E890 Postprocedural hypothyroidism: Secondary | ICD-10-CM

## 2021-12-11 DIAGNOSIS — Z0279 Encounter for issue of other medical certificate: Secondary | ICD-10-CM

## 2021-12-11 LAB — POCT GLYCOSYLATED HEMOGLOBIN (HGB A1C): Hemoglobin A1C: 11.4 % — AB (ref 4.0–5.6)

## 2021-12-11 LAB — POCT GLUCOSE (DEVICE FOR HOME USE): POC Glucose: 253 mg/dl — AB (ref 70–99)

## 2021-12-11 LAB — TSH: TSH: 11.91 u[IU]/mL — ABNORMAL HIGH (ref 0.35–5.50)

## 2021-12-11 LAB — BASIC METABOLIC PANEL
BUN: 17 mg/dL (ref 6–23)
CO2: 29 mEq/L (ref 19–32)
Calcium: 9.4 mg/dL (ref 8.4–10.5)
Chloride: 98 mEq/L (ref 96–112)
Creatinine, Ser: 1.13 mg/dL (ref 0.40–1.20)
GFR: 54.54 mL/min — ABNORMAL LOW (ref >60.00)
Glucose, Bld: 244 mg/dL — ABNORMAL HIGH (ref 70–99)
Potassium: 3.7 mEq/L (ref 3.5–5.1)
Sodium: 136 mEq/L (ref 135–145)

## 2021-12-11 LAB — T4, FREE: Free T4: 0.76 ng/dL (ref 0.60–1.60)

## 2021-12-11 MED ORDER — CANAGLIFLOZIN 300 MG PO TABS: 300.0000 mg | ORAL_TABLET | Freq: Every day | ORAL | 3 refills | Status: DC

## 2021-12-11 MED ORDER — HUMULIN R U-500 KWIKPEN 500 UNIT/ML ~~LOC~~ SOPN: PEN_INJECTOR | SUBCUTANEOUS | 0 refills | Status: DC

## 2021-12-11 NOTE — Patient Instructions (Addendum)
Walk daily  Check blood sugars on waking up   Also check blood sugars about 2 hours after meals and do this after different meals by rotation  Recommended blood sugar levels on waking up are 90-130 and about 2 hours after meal is 130-180  Please bring your blood sugar monitor to each visit, thank you  Reduce Toujeo to 50  Humulin R 50 units, 30 min before each meal  IF SUGAR IS <100 within 6 hrs of new insulin dose reduce that dose by 5 units  IF SUGAR IS >250 within 6 hrs of new insulin dose then go up 10 unit of that dose next day

## 2021-12-11 NOTE — Therapy (Signed)
OUTPATIENT PHYSICAL THERAPY NEURO TREATMENT   Patient Name: Kendra Hall MRN: [id number] DOB:1965-11-30, 56 y.o., female Today's Date: 12/12/2021   PCP: Simona Huh, NP REFERRING PROVIDER: Thurnell Lose, MD    PT End of Session - 12/12/21 1705     Visit Number 2    Number of Visits 7    Date for PT Re-Evaluation 01/16/22    Authorization Type [employer]    PT Start Time 1626   pt late   PT Stop Time 1702    PT Time Calculation (min) 36 min    Equipment Utilized During Treatment Gait belt    Activity Tolerance Patient tolerated treatment well    Behavior During Therapy WFL for tasks assessed/performed              Past Medical History:  Diagnosis Date   Allergy    Anemia    history of   Anxiety    Asthma    Depression    Diabetes mellitus    type 2    Fibromyalgia    Gastric ulcer    GERD (gastroesophageal reflux disease)    Graves disease    H/O therapeutic radiation    Migraine    Multiple thyroid nodules    Myofacial muscle pain    Osteoarthritis    Facet hypertrophy with injections   TIA (transient ischemic attack)    02/2015   Past Surgical History:  Procedure Laterality Date   AXILLARY LYMPH NODE DISSECTION  2001   CHOLECYSTECTOMY N/A 09/03/2018   Procedure: LAPAROSCOPIC CHOLECYSTECTOMY;  Surgeon: Coralie Keens, MD;  Location: [facility];  Service: General;  Laterality: N/A;   [place] Left 03/11/2017   Procedure: HYSTERECTOMY VAGINAL W/LEFT PROXIMAL SALPINGECTOMY;  Surgeon: Princess Bruins, MD;  Location: [facility];  Service: Gynecology;  Laterality: Left;  request 7:30am OR time  request 2 hours     Patient Active Problem List   Diagnosis Date Noted   Stroke-like symptoms 11/15/2021   Dyslipidemia 11/15/2021   Cervical stenosis of spine 11/15/2021   Severe obstructive sleep apnea-hypopnea syndrome 03/22/2020   Sleep-related hypoxia 03/22/2020   Intractable episodic cluster  headache 02/10/2020   Abnormal dreams 02/10/2020   Excessive daytime sleepiness 02/10/2020   Snoring 02/10/2020   Sleep related headaches 02/10/2020   Lt facial numbness 09/25/2017   Back pain 06/15/2017   Postoperative state 03/11/2017   Trigger point 11/28/2016   Health care maintenance 02/04/2016   Severe episode of recurrent major depressive disorder, with psychotic features ([place]) 08/22/2015   GAD (generalized anxiety disorder) 08/22/2015   PTSD (post-traumatic stress disorder) 08/22/2015   Insomnia 08/22/2015   GERD (gastroesophageal reflux disease) 04/18/2015   Fibromyalgia 03/29/2015   Hypothyroidism    Type 2 diabetes mellitus without complication ([place])    TIA (transient ischemic attack) 03/15/2015   Mild intermittent asthma 02/28/2012   Anxiety disorder 02/07/2012   Hypertension 01/07/2012   Hypothyroidism following radioiodine therapy 08/28/2011   Grave's disease 05/07/2011   Family hx-breast malignancy 11/20/2010   Chronic migraine without aura 05/04/2010   Left shoulder pain 09/08/2009   TOBACCO USER 03/11/2009   Notalgia 01/09/2009   Class 2 obesity due to excess calories with body mass index (BMI) of 37.0 to 37.9 in adult 09/27/2008   Mood disorder ([place]) 09/22/2008    ONSET DATE: 11/15/21  REFERRING DIAG: R47.81 (ICD-10-CM) - Slurred speech R29.90 (ICD-10-CM) - Stroke-like symptoms   THERAPY DIAG:  Muscle weakness (generalized)  Other abnormalities of gait and mobility  Muscle spasm of back  Rationale for Evaluation and Treatment Rehabilitation  SUBJECTIVE:                                                                                                                                                                                              SUBJECTIVE STATEMENT: Got stuck on a call at work. Saw her endocrinologist yesterday and her BG was in the 500s; was 170 this AM. Pt accompanied by: self  PERTINENT HISTORY: anemia, asthma, anxiety, depression,  DM, fibromyalgia, GERD, migraines, TIA  PAIN:  Are you having pain? Yes: NPRS scale: 5/10 Pain location: LB Pain description: tight Aggravating factors: walking Relieving factors: stretching, heat, muscle relaxants  PRECAUTIONS: Fall  PATIENT GOALS work on muscle tightness   OBJECTIVE:     TODAY'S TREATMENT: 12/12/21 Activity Comments  FOTO [phone]  open book stretch 5x each   prayer stretch ball forward 5x5" Limited ROM; encouraged pain-free movement  Fitting fig 4/KTOS 30" Cueing for positioning; reports more stretch on R  R/L open book stretch 5x C/o "clicking and popping" in shoulder and LB    [facility] PT Assessment - 12/12/21 0001       Standardized Balance Assessment   Standardized Balance Assessment Berg Balance Test      Berg Balance Test   Sit to Stand Able to stand without using hands and stabilize independently    Standing Unsupported Able to stand safely 2 minutes    Sitting with Back Unsupported but Feet Supported on Floor or Stool Able to sit safely and securely 2 minutes    Stand to Sit Sits safely with minimal use of hands    Transfers Able to transfer safely, minor use of hands    Standing Unsupported with Eyes Closed Able to stand 10 seconds safely    Standing Unsupported with Feet Together Able to place feet together independently and stand 1 minute safely    From Standing, Reach Forward with Outstretched Arm Can reach forward >12 cm safely (5")    From Standing Position, Pick up Object from Floor Able to pick up shoe, needs supervision    From Standing Position, Turn to Look Behind Over each Shoulder Looks behind from both sides and weight shifts well    Turn 360 Degrees Able to turn 360 degrees safely one side only in 4 seconds or less    Standing Unsupported, Alternately Place Feet on Step/Stool Able to stand independently and safely and complete 8 steps in 20 seconds    Standing Unsupported, One Foot in [place] to  place foot tandem independently and  hold 30 seconds    Standing on One Leg Able to lift leg independently and hold > 10 seconds    Total Score 53              PATIENT EDUCATION: Education details: edu on avoiding exercise if BG valgues >300; HEP update Person educated: Patient Education method: Explanation, Demonstration, Tactile cues, Verbal cues, and Handouts Education comprehension: verbalized understanding and returned demonstration HOME EXERCISE PROGRAM Last updated: 12/12/21 Access Code: [email or web address] URL: [email or web address] Date: 12/12/2021 Prepared by: [facility]  Exercises - Marching with Resistance  - 1 x daily - 5 x weekly - 2 sets - 10 reps - Standing Hip Abduction with Counter Support  - 1 x daily - 5 x weekly - 2 sets - 10 reps - Sit to Stand Without Arm Support  - 1 x daily - 5 x weekly - 2 sets - 10 reps - Heel Toe Raises with Unilateral Counter Support  - 1 x daily - 5 x weekly - 2 sets - 10 reps - Seated Flexion Stretch with Swiss Ball  - 1 x daily - 5 x weekly - 2 sets - 5 reps - 5 sec hold - Seated Piriformis Stretch  - 1 x daily - 5 x weekly - 2 sets - 30 sec hold - Seated Figure 4 Piriformis Stretch  - 1 x daily - 5 x weekly - 2 sets - 30 sec hold - Sidelying Thoracic Rotation with Open Book  - 1 x daily - 5 x weekly - 2 sets - 5 reps    Below measures were taken at time of initial evaluation unless otherwise specified:   DIAGNOSTIC FINDINGS: 11/16/21 cervical MRI: mild-moderate C4-5 and C5-6 central canal stenosis, moderate-severe C4-5, C5-6, C6-7 neuroforaminal stenosis   11/15/21 brain MRI: Motion degraded examination without evidence of acute intracranial abnormality  COGNITION: Overall cognitive status: No family/caregiver present to determine baseline cognitive functioning   SENSATION: WFL  COORDINATION: Alt pronation/supination slightly dysmetric on L hand; B finger to nose and heel to shin normal   MUSCLE TONE: LLE:  Within functional limits   POSTURE: rounded shoulders and increased lumbar lordosis   LOWER EXTREMITY ROM:     Active  Right Eval Left Eval  Hip flexion    Hip extension    Hip abduction    Hip adduction    Hip internal rotation    Hip external rotation    Knee flexion    Knee extension    Ankle dorsiflexion 24 20  Ankle plantarflexion    Ankle inversion    Ankle eversion     (Blank rows = not tested)  Active   Eval *pain with all movements  lumbar flexion Mid shin  Lumbar extension To neutral  R SBing Distal tibia  L SBing Distal tibia  R rotation 75% limited  L rotation 75% limited   (Blank rows = not tested)  *midline lumbar spine and L superior glute TTP; increased soft tissue restriction in B lumbar paraspinals, glutes and piriformis   LOWER EXTREMITY MMT:    MMT (in sitting) Right Eval Left Eval  Hip flexion 5 4-  Hip extension    Hip abduction 4+ 4  Hip adduction 4+ 4  Hip internal rotation    Hip external rotation    Knee flexion 4+ 4  Knee extension 4+ 4  Ankle dorsiflexion 5 4  Ankle plantarflexion 4+ 4  Ankle inversion    Ankle eversion    (Blank rows = not tested)  GAIT: Gait pattern:   , step to pattern, step through pattern, decreased step length- Right, and decreased stance time- Left Assistive device utilized: None Level of assistance: Complete Independence   FUNCTIONAL TESTs:  5 times sit to stand: 15.23 sec without Ues; unable to fully stand each time  Timed up and go (TUG): 15.87 sec   PATIENT SURVEYS:  NGEX [id number]    PATIENT EDUCATION: Education details: prognosis, POC, HEP Person educated: Patient Education method: Explanation, Demonstration, and Handouts Education comprehension: verbalized understanding   HOME EXERCISE PROGRAM: Access Code: [email or web address] URL: [email or web address] Date: 12/05/2021 Prepared by: [facility]  Exercises - Marching with Resistance  - 1 x  daily - 5 x weekly - 2 sets - 10 reps - Standing Hip Abduction with Counter Support  - 1 x daily - 5 x weekly - 2 sets - 10 reps - Sit to Stand Without Arm Support  - 1 x daily - 5 x weekly - 2 sets - 10 reps - Heel Toe Raises with Unilateral Counter Support  - 1 x daily - 5 x weekly - 2 sets - 10 reps   GOALS: Goals reviewed with patient? Yes  SHORT TERM GOALS: Target date: 12/19/2021  Patient to be independent with initial HEP. Baseline: HEP initiated Goal status: IN PROGRESS    LONG TERM GOALS: Target date: 01/16/2022  Patient to be independent with advanced HEP. Baseline: Not yet initiated  Goal status: IN PROGRESS  Patient to demonstrate B LE strength >/=4+/5.  Baseline: See above Goal status: IN PROGRESS  Patient to demonstrate lumbar AROM WFL and without pain limiting.  Baseline: see above Goal status: IN PROGRESS  Patient to report and demonstrate improved head, neck, and shoulder posture at rest and with activity.  Baseline: see above Goal status: IN PROGRESS  Patient to complete TUG in <14 sec with LRAD in order to decrease risk of falls.   Baseline: 15.87 sec Goal status: IN PROGRESS  Patient to demonstrate 5xSTS test in <15 sec in order to decrease risk of falls.  Baseline: 15.23 sec Goal status: IN PROGRESS  Patient to score at least 46/56 on Berg in order to decrease risk of falls.  Baseline: NT Goal status: IN PROGRESS    ASSESSMENT:  CLINICAL IMPRESSION:  Patient arrived to session with report of seeing Endocrinology for elevated BG in the 500s yesterday. Reports more normal BG today. Educated patient on importance of tracking BG levels and precaution to avoid exercise at values >300. Balance was assessed which revealed 53/56 on Berg with most challenge on SLS and narrow BOS. However patient's scored indicates a decreased risk of falls. Initiated gentle lumbopelvic ROM with consistent cueing to avoid pushing into pain. HEP was updated and patient  reported understanding. No complaints at end of session.     OBJECTIVE IMPAIRMENTS Abnormal gait, decreased activity tolerance, decreased balance, decreased coordination, difficulty walking, decreased ROM, decreased strength, impaired flexibility, improper body mechanics, postural dysfunction, and pain.   ACTIVITY LIMITATIONS carrying, lifting, bending, standing, squatting, stairs, and dressing  PARTICIPATION LIMITATIONS: meal prep, cleaning, laundry, shopping, community activity, and church  PERSONAL FACTORS Age, Fitness, Past/current experiences, Time since onset of injury/illness/exacerbation, and 3+ comorbidities: anemia, asthma, anxiety, depression, DM, fibromyalgia, GERD, migraines, TIA  are also affecting patient's functional outcome.   REHAB POTENTIAL: Good  CLINICAL DECISION MAKING: Evolving/moderate complexity  EVALUATION COMPLEXITY:  Moderate  PLAN: PT FREQUENCY: 1x/week  PT DURATION: 6 weeks  PLANNED INTERVENTIONS: Therapeutic exercises, Therapeutic activity, Neuromuscular re-education, Balance training, Gait training, Patient/Family education, Joint mobilization, Stair training, Vestibular training, Canalith repositioning, Dry Needling, Electrical stimulation, Cryotherapy, Moist heat, Taping, Manual therapy, and Re-evaluation  PLAN FOR NEXT SESSION: progress L LE strengthening, hip and lumbar mobility    Janene Harvey, PT, DPT 12/12/21 5:09 PM  [facility] at [facility] [address], [place], [place] [address] Phone # [phone] Fax # [phone]

## 2021-12-12 ENCOUNTER — Telehealth: Payer: Self-pay | Admitting: Endocrinology

## 2021-12-12 ENCOUNTER — Ambulatory Visit: Payer: 59 | Admitting: Physical Therapy

## 2021-12-12 ENCOUNTER — Encounter: Payer: Self-pay | Admitting: Physical Therapy

## 2021-12-12 DIAGNOSIS — R2689 Other abnormalities of gait and mobility: Secondary | ICD-10-CM

## 2021-12-12 DIAGNOSIS — M62830 Muscle spasm of back: Secondary | ICD-10-CM

## 2021-12-12 DIAGNOSIS — M6281 Muscle weakness (generalized): Secondary | ICD-10-CM

## 2021-12-12 DIAGNOSIS — E119 Type 2 diabetes mellitus without complications: Secondary | ICD-10-CM

## 2021-12-12 NOTE — Telephone Encounter (Signed)
Patient called to advise that the Victoza is going to require a Prior Authorization for her insurance to cover.  Patient is also requesting a new meter to be sent to [facility] on [address]. Patient is also requesting that the test strips and new lancets be sent for the new meter to [place] for fill.  Call back # [phone]

## 2021-12-13 MED ORDER — ONETOUCH ULTRASOFT LANCETS MISC: 2 refills | Status: DC

## 2021-12-13 MED ORDER — ONETOUCH VERIO W/DEVICE KIT: PACK | 0 refills | Status: DC

## 2021-12-13 MED ORDER — ONETOUCH VERIO VI STRP: ORAL_STRIP | 2 refills | Status: DC

## 2021-12-14 ENCOUNTER — Telehealth: Payer: Self-pay | Admitting: Neurology

## 2021-12-17 ENCOUNTER — Telehealth: Payer: Self-pay | Admitting: Neurology

## 2021-12-17 ENCOUNTER — Other Ambulatory Visit (HOSPITAL_COMMUNITY): Payer: Self-pay

## 2021-12-17 ENCOUNTER — Other Ambulatory Visit: Payer: Self-pay

## 2021-12-17 DIAGNOSIS — E119 Type 2 diabetes mellitus without complications: Secondary | ICD-10-CM

## 2021-12-17 DIAGNOSIS — E890 Postprocedural hypothyroidism: Secondary | ICD-10-CM

## 2021-12-17 MED ORDER — LEVOTHYROXINE SODIUM 112 MCG PO TABS: 112.0000 ug | ORAL_TABLET | Freq: Every day | ORAL | 1 refills | Status: DC

## 2021-12-17 MED ORDER — ONETOUCH VERIO VI STRP: ORAL_STRIP | 2 refills | Status: DC

## 2021-12-17 MED ORDER — ONETOUCH ULTRASOFT LANCETS MISC: 2 refills | Status: DC

## 2021-12-17 MED ORDER — LEVOTHYROXINE SODIUM 137 MCG PO TABS: 137.0000 ug | ORAL_TABLET | Freq: Every day | ORAL | 3 refills | Status: DC

## 2021-12-17 MED ORDER — ONETOUCH VERIO W/DEVICE KIT: PACK | 0 refills | Status: AC

## 2021-12-17 NOTE — Telephone Encounter (Signed)
Working on [employer] for [employer]. Would the pt be able to take Jardiance or Tranjenta instead of Invokana? Neither of those require a PA and are $40 copays.

## 2021-12-18 ENCOUNTER — Other Ambulatory Visit (HOSPITAL_COMMUNITY): Payer: Self-pay

## 2021-12-18 NOTE — Therapy (Signed)
OUTPATIENT PHYSICAL THERAPY NEURO TREATMENT   Patient Name: Kendra Hall MRN: [id number] DOB:01-24-1966, 56 y.o., female Today's Date: 12/20/2021   PCP: Simona Huh, NP REFERRING PROVIDER: Thurnell Lose, MD    PT End of Session - 12/20/21 1639     Visit Number 3    Number of Visits 7    Date for PT Re-Evaluation 01/16/22    Authorization Type [employer]    PT Start Time 9179    PT Stop Time 1633    PT Time Calculation (min) 16 min    Equipment Utilized During Treatment Gait belt    Activity Tolerance Other (comment)    Behavior During Therapy WFL for tasks assessed/performed               Past Medical History:  Diagnosis Date   Allergy    Anemia    history of   Anxiety    Asthma    Depression    Diabetes mellitus    type 2    Fibromyalgia    Gastric ulcer    GERD (gastroesophageal reflux disease)    Graves disease    H/O therapeutic radiation    Migraine    Multiple thyroid nodules    Myofacial muscle pain    Osteoarthritis    Facet hypertrophy with injections   TIA (transient ischemic attack)    02/2015   Past Surgical History:  Procedure Laterality Date   AXILLARY LYMPH NODE DISSECTION  2001   CHOLECYSTECTOMY N/A 09/03/2018   Procedure: LAPAROSCOPIC CHOLECYSTECTOMY;  Surgeon: Coralie Keens, MD;  Location: [facility];  Service: General;  Laterality: N/A;   [place] Left 03/11/2017   Procedure: HYSTERECTOMY VAGINAL W/LEFT PROXIMAL SALPINGECTOMY;  Surgeon: Princess Bruins, MD;  Location: [facility];  Service: Gynecology;  Laterality: Left;  request 7:30am OR time  request 2 hours     Patient Active Problem List   Diagnosis Date Noted   Stroke-like symptoms 11/15/2021   Dyslipidemia 11/15/2021   Cervical stenosis of spine 11/15/2021   Severe obstructive sleep apnea-hypopnea syndrome 03/22/2020   Sleep-related hypoxia 03/22/2020   Intractable episodic cluster headache 02/10/2020    Abnormal dreams 02/10/2020   Excessive daytime sleepiness 02/10/2020   Snoring 02/10/2020   Sleep related headaches 02/10/2020   Lt facial numbness 09/25/2017   Back pain 06/15/2017   Postoperative state 03/11/2017   Trigger point 11/28/2016   Health care maintenance 02/04/2016   Severe episode of recurrent major depressive disorder, with psychotic features ([place]) 08/22/2015   GAD (generalized anxiety disorder) 08/22/2015   PTSD (post-traumatic stress disorder) 08/22/2015   Insomnia 08/22/2015   GERD (gastroesophageal reflux disease) 04/18/2015   Fibromyalgia 03/29/2015   Hypothyroidism    Type 2 diabetes mellitus without complication ([place])    TIA (transient ischemic attack) 03/15/2015   Mild intermittent asthma 02/28/2012   Anxiety disorder 02/07/2012   Hypertension 01/07/2012   Hypothyroidism following radioiodine therapy 08/28/2011   Grave's disease 05/07/2011   Family hx-breast malignancy 11/20/2010   Chronic migraine without aura 05/04/2010   Left shoulder pain 09/08/2009   TOBACCO USER 03/11/2009   Notalgia 01/09/2009   Class 2 obesity due to excess calories with body mass index (BMI) of 37.0 to 37.9 in adult 09/27/2008   Mood disorder ([place]) 09/22/2008    ONSET DATE: 11/15/21  REFERRING DIAG: R47.81 (ICD-10-CM) - Slurred speech R29.90 (ICD-10-CM) - Stroke-like symptoms   THERAPY DIAG:  Muscle weakness (generalized)  Other abnormalities of gait and mobility  Muscle spasm of back  Rationale for Evaluation and Treatment Rehabilitation  SUBJECTIVE:                                                                                                                                                                                              SUBJECTIVE STATEMENT: Back pain is worse today; more on the L side. Every since she got her continuous glucose monitor, BG has been ready low. Currently at '73mg'$ /dL despite drinking a pepsi. Had an episode when it was 43-68 mg/dL and had  cold feet, nausea. Denies current nausea, lightheadedness, no diaphoresis currently. Had a banana for breakfast and had a full lunch today.  Pt accompanied by: self  PERTINENT HISTORY: anemia, asthma, anxiety, depression, DM, fibromyalgia, GERD, migraines, TIA  PAIN:  Are you having pain? Yes: NPRS scale: 8/10 Pain location: LB Pain description: tight Aggravating factors: walking Relieving factors: stretching, heat, muscle relaxants  PRECAUTIONS: Fall  PATIENT GOALS work on muscle tightness   OBJECTIVE:     TODAY'S TREATMENT: 12/20/21 Activity Comments  STS L foot back x5      PATIENT EDUCATION: Education details: educated patient on safety concerns on exercising with blood glucose <70; told patient that this PT will reach out to endocrinologist to make aware Person educated: Patient Education method: Explanation Education comprehension: verbalized understanding   HOME EXERCISE PROGRAM Last updated: 12/12/21 Access Code: [email or web address] URL: [email or web address] Date: 12/12/2021 Prepared by: [facility]  Exercises - Marching with Resistance  - 1 x daily - 5 x weekly - 2 sets - 10 reps - Standing Hip Abduction with Counter Support  - 1 x daily - 5 x weekly - 2 sets - 10 reps - Sit to Stand Without Arm Support  - 1 x daily - 5 x weekly - 2 sets - 10 reps - Heel Toe Raises with Unilateral Counter Support  - 1 x daily - 5 x weekly - 2 sets - 10 reps - Seated Flexion Stretch with Swiss Ball  - 1 x daily - 5 x weekly - 2 sets - 5 reps - 5 sec hold - Seated Piriformis Stretch  - 1 x daily - 5 x weekly - 2 sets - 30 sec hold - Seated Figure 4 Piriformis Stretch  - 1 x daily - 5 x weekly - 2 sets - 30 sec hold - Sidelying Thoracic Rotation with Open Book  - 1 x daily - 5 x weekly - 2 sets - 5 reps    Below measures were taken at time  of initial evaluation unless otherwise specified:   DIAGNOSTIC FINDINGS: 11/16/21 cervical MRI:  mild-moderate C4-5 and C5-6 central canal stenosis, moderate-severe C4-5, C5-6, C6-7 neuroforaminal stenosis   11/15/21 brain MRI: Motion degraded examination without evidence of acute intracranial abnormality  COGNITION: Overall cognitive status: No family/caregiver present to determine baseline cognitive functioning   SENSATION: WFL  COORDINATION: Alt pronation/supination slightly dysmetric on L hand; B finger to nose and heel to shin normal   MUSCLE TONE: LLE: Within functional limits   POSTURE: rounded shoulders and increased lumbar lordosis   LOWER EXTREMITY ROM:     Active  Right Eval Left Eval  Hip flexion    Hip extension    Hip abduction    Hip adduction    Hip internal rotation    Hip external rotation    Knee flexion    Knee extension    Ankle dorsiflexion 24 20  Ankle plantarflexion    Ankle inversion    Ankle eversion     (Blank rows = not tested)  Active   Eval *pain with all movements  lumbar flexion Mid shin  Lumbar extension To neutral  R SBing Distal tibia  L SBing Distal tibia  R rotation 75% limited  L rotation 75% limited   (Blank rows = not tested)  *midline lumbar spine and L superior glute TTP; increased soft tissue restriction in B lumbar paraspinals, glutes and piriformis   LOWER EXTREMITY MMT:    MMT (in sitting) Right Eval Left Eval  Hip flexion 5 4-  Hip extension    Hip abduction 4+ 4  Hip adduction 4+ 4  Hip internal rotation    Hip external rotation    Knee flexion 4+ 4  Knee extension 4+ 4  Ankle dorsiflexion 5 4  Ankle plantarflexion 4+ 4  Ankle inversion    Ankle eversion    (Blank rows = not tested)  GAIT: Gait pattern:   , step to pattern, step through pattern, decreased step length- Right, and decreased stance time- Left Assistive device utilized: None Level of assistance: Complete Independence   FUNCTIONAL TESTs:  5 times sit to stand: 15.23 sec without Ues; unable to fully stand each time  Timed up  and go (TUG): 15.87 sec   PATIENT SURVEYS:  QJJH [phone]    PATIENT EDUCATION: Education details: prognosis, POC, HEP Person educated: Patient Education method: Explanation, Demonstration, and Handouts Education comprehension: verbalized understanding   HOME EXERCISE PROGRAM: Access Code: [email or web address] URL: [email or web address] Date: 12/05/2021 Prepared by: [facility]  Exercises - Marching with Resistance  - 1 x daily - 5 x weekly - 2 sets - 10 reps - Standing Hip Abduction with Counter Support  - 1 x daily - 5 x weekly - 2 sets - 10 reps - Sit to Stand Without Arm Support  - 1 x daily - 5 x weekly - 2 sets - 10 reps - Heel Toe Raises with Unilateral Counter Support  - 1 x daily - 5 x weekly - 2 sets - 10 reps   GOALS: Goals reviewed with patient? Yes  SHORT TERM GOALS: Target date: 12/19/2021  Patient to be independent with initial HEP. Baseline: HEP initiated Goal status: IN PROGRESS    LONG TERM GOALS: Target date: 01/16/2022  Patient to be independent with advanced HEP. Baseline: Not yet initiated  Goal status: IN PROGRESS  Patient to demonstrate B LE strength >/=4+/5.  Baseline: See above Goal status: IN PROGRESS  Patient to demonstrate lumbar AROM WFL and without pain limiting.  Baseline: see above Goal status: IN PROGRESS  Patient to report and demonstrate improved head, neck, and shoulder posture at rest and with activity.  Baseline: see above Goal status: IN PROGRESS  Patient to complete TUG in <14 sec with LRAD in order to decrease risk of falls.   Baseline: 15.87 sec Goal status: IN PROGRESS  Patient to demonstrate 5xSTS test in <15 sec in order to decrease risk of falls.  Baseline: 15.23 sec Goal status: IN PROGRESS  Patient to score at least 46/56 on Berg in order to decrease risk of falls.  Baseline: NT Goal status: IN PROGRESS    ASSESSMENT:  CLINICAL IMPRESSION:  Patient arrived to  session blood glucose at 73 mg/dL however was asymptomatic. Reports that since she has been using her CGM that her glucose levels have been low, with lowest value at 43. Provided patient with a high-sugar snack and monitored for symptoms. BG levels dropped to '68mg'$ /dL however patient remained asymptomatic. Patient had daughter meet her at end of session for safety as full session could not be completed.     OBJECTIVE IMPAIRMENTS Abnormal gait, decreased activity tolerance, decreased balance, decreased coordination, difficulty walking, decreased ROM, decreased strength, impaired flexibility, improper body mechanics, postural dysfunction, and pain.   ACTIVITY LIMITATIONS carrying, lifting, bending, standing, squatting, stairs, and dressing  PARTICIPATION LIMITATIONS: meal prep, cleaning, laundry, shopping, community activity, and church  PERSONAL FACTORS Age, Fitness, Past/current experiences, Time since onset of injury/illness/exacerbation, and 3+ comorbidities: anemia, asthma, anxiety, depression, DM, fibromyalgia, GERD, migraines, TIA  are also affecting patient's functional outcome.   REHAB POTENTIAL: Good  CLINICAL DECISION MAKING: Evolving/moderate complexity  EVALUATION COMPLEXITY: Moderate  PLAN: PT FREQUENCY: 1x/week  PT DURATION: 6 weeks  PLANNED INTERVENTIONS: Therapeutic exercises, Therapeutic activity, Neuromuscular re-education, Balance training, Gait training, Patient/Family education, Joint mobilization, Stair training, Vestibular training, Canalith repositioning, Dry Needling, Electrical stimulation, Cryotherapy, Moist heat, Taping, Manual therapy, and Re-evaluation  PLAN FOR NEXT SESSION: progress L LE strengthening, hip and lumbar mobility    Janene Harvey, PT, DPT 12/20/21 4:41 PM  [facility] at [facility] [address], [place],  [address] Phone # [phone] Fax # [phone]

## 2021-12-19 ENCOUNTER — Encounter: Payer: 59 | Admitting: Occupational Therapy

## 2021-12-19 ENCOUNTER — Other Ambulatory Visit: Payer: Self-pay | Admitting: Endocrinology

## 2021-12-19 MED ORDER — EMPAGLIFLOZIN 10 MG PO TABS: 10.0000 mg | ORAL_TABLET | Freq: Every day | ORAL | 3 refills | Status: DC

## 2021-12-20 ENCOUNTER — Telehealth: Payer: Self-pay | Admitting: Physical Therapy

## 2021-12-20 ENCOUNTER — Ambulatory Visit: Payer: 59 | Admitting: Physical Therapy

## 2021-12-20 ENCOUNTER — Other Ambulatory Visit (HOSPITAL_COMMUNITY): Payer: Self-pay

## 2021-12-20 DIAGNOSIS — M6281 Muscle weakness (generalized): Secondary | ICD-10-CM | POA: Diagnosis not present

## 2021-12-20 DIAGNOSIS — R2689 Other abnormalities of gait and mobility: Secondary | ICD-10-CM

## 2021-12-20 DIAGNOSIS — M62830 Muscle spasm of back: Secondary | ICD-10-CM

## 2021-12-20 NOTE — Telephone Encounter (Signed)
Hi Dr. Dwyane Dee,  I am seeing [name] in [place] s/p suspected TIA. She presented today with Continuous Glucose Monitor showing BG at '73mg'$ /dL and dropping down to '68mg'$ /dL after a high sugar snack. Patient was asymptomatic today, but recalled other recent episodes of nausea and cold feet while values were at 43-'68mg'$ /dL. Please advise.  Thanks!  Janene Harvey, PT, DPT 12/20/21 4:43 PM  [facility] at [facility] [address] [place], [place], [place] [address] Phone # [phone] Fax # [phone]

## 2021-12-21 NOTE — Telephone Encounter (Signed)
She has been contacted and understands.

## 2021-12-26 ENCOUNTER — Encounter: Payer: 59 | Admitting: Occupational Therapy

## 2021-12-27 ENCOUNTER — Ambulatory Visit (INDEPENDENT_AMBULATORY_CARE_PROVIDER_SITE_OTHER): Payer: 59 | Admitting: Endocrinology

## 2021-12-27 ENCOUNTER — Ambulatory Visit: Payer: 59 | Attending: Internal Medicine

## 2021-12-27 ENCOUNTER — Other Ambulatory Visit: Payer: Self-pay | Admitting: Endocrinology

## 2021-12-27 ENCOUNTER — Telehealth: Payer: Self-pay | Admitting: Neurology

## 2021-12-27 ENCOUNTER — Encounter: Payer: Self-pay | Admitting: Endocrinology

## 2021-12-27 VITALS — BP 134/70 | HR 80 | Ht 64.0 in | Wt 221.2 lb

## 2021-12-27 DIAGNOSIS — R2689 Other abnormalities of gait and mobility: Secondary | ICD-10-CM | POA: Insufficient documentation

## 2021-12-27 DIAGNOSIS — M6281 Muscle weakness (generalized): Secondary | ICD-10-CM | POA: Insufficient documentation

## 2021-12-27 DIAGNOSIS — E063 Autoimmune thyroiditis: Secondary | ICD-10-CM | POA: Diagnosis not present

## 2021-12-27 DIAGNOSIS — Z794 Long term (current) use of insulin: Secondary | ICD-10-CM

## 2021-12-27 DIAGNOSIS — E1165 Type 2 diabetes mellitus with hyperglycemia: Secondary | ICD-10-CM

## 2021-12-27 MED ORDER — FAMOTIDINE 20 MG PO TABS: 20.0000 mg | ORAL_TABLET | Freq: Every evening | ORAL | 0 refills | Status: DC

## 2021-12-27 NOTE — Patient Instructions (Addendum)
Humulin 40-55 before meals   Toujeo 46 units in am daily  Check blood sugars on waking up    Also check blood sugars about 2 hours after meals and do this after different meals by rotation  Recommended blood sugar levels on waking up are 90-130 and about 2 hours after meal is 130-180  Please bring your blood sugar monitor to each visit, thank you  Walk daily

## 2021-12-27 NOTE — Progress Notes (Signed)
Patient ID: Kendra Hall, female   DOB: 06-Feb-1966, 56 y.o.   MRN: [id number]           Reason for Appointment: Endocrinology follow-up   History of Present Illness   Diagnosis date: 2012  Previous history:  hypoglycemic drugs previously used : Trulicity, Invokana, glimepiride, Prandin and Victoza Insulin was started in 2020  Her blood sugars have been progressively higher since 2019 A1c range in the last few years is: 6-12.7  Recent history:     Non-insulin hypoglycemic drugs: Jardiance 10 mg daily     Insulin regimen: Toujeo 50 units in the morning daily, Humulin R U-500 insulin, 50-units before meals         Side effects from medications: Vomiting and constipation from Trulicity  Current self management, blood sugar patterns and problems identified:  A1c is 11.4 and has been about the same this year She started using the Dexcom about 9 days ago and has had no difficulties with this  Although her blood sugars are somewhat variable they are overall much better than before  She is now using a regimen of HUMULIN R and has been able to reduce her Toujeo dose significantly  She had called to report relatively low blood sugars overnight and was told to reduce her insulin but she reduced her doses to 40 units only yesterday  She still has some high readings periodically after meals based on her diet with occasional readings over 200  Also may have some tendency to low sugars still right after midnight or low normal reading before dinnertime  She appears to be a little more active with the afternoon and also may gently eat lower carbohydrate meals at lunch With this her lowest blood sugars are around 6 PM before dinner  She has still not seen the diabetes educator for review of her management as appointment is in September With her blood sugars coming down her weight has gone up This is despite starting JARDIANCE 10 mg daily which is being tolerated well Diet management: Breakfast  usually:oatmeal/ cereal/muffin, usually eating 3 meals a day       Blood sugars are showing moderate variability with CV 37 HYPERGLYCEMIA is present inconsistently at different times including early morning, early afternoon and evenings On an average her highest blood sugar is 182 at 6-7 AM LOWEST blood sugars on average is 111 from 5-6 PM POSTPRANDIAL readings are sporadically higher at all the meals especially after dinner However she may have HYPERGLYCEMIA is a rebound from low normal sugars around 3 AM Hypoglycemia is transiently seen at least twice between 12 AM-2 AM and low normal readings early evening  CGM use % of time 57  2-week average/GV 153  Time in range      69%  % Time Above 180 21+7  % Time above 250   % Time Below 70 2     EXERCISE: She is starting to do some walking now           Dietician visit: Most recent: None     Weight control: Previously maximum was 218  Wt Readings from Last 3 Encounters:  12/27/21 221 lb 3.2 oz (100.3 kg)  12/11/21 213 lb (96.6 kg)  11/28/21 213 lb (96.6 kg)         Diabetes labs:  Lab Results  Component Value Date   HGBA1C 11.4 (A) 12/11/2021   HGBA1C 11.0 (A) 10/18/2021   HGBA1C 12.2 (A) 08/09/2021   Lab Results  Component  Value Date   LDLCALC 28 11/16/2021   CREATININE 1.13 12/11/2021     Allergies as of 12/27/2021       Reactions   Gabapentin Other (See Comments)   unknown   Eggs Or Egg-derived Products Other (See Comments)   Don't eat eggs   Ergotrate [ergonovine] Other (See Comments)   Pt does not ever want   Ibuprofen Other (See Comments)   Patient has ulcer   Latex Other (See Comments)   Breaks out   Metformin And Related Other (See Comments)   Dizziness   Yutopar [ritodrine] Other (See Comments)   Pt does not ever want        Medication List        Accurate as of December 27, 2021 12:04 PM. If you have any questions, ask your nurse or doctor.          acetaminophen 500 MG tablet Commonly known  as: TYLENOL Take 1,000 mg by mouth every 6 (six) hours as needed for moderate pain.   albuterol 108 (90 Base) MCG/ACT inhaler Commonly known as: ProAir HFA Inhale 2 puffs into the lungs every 4 (four) hours as needed for wheezing.   albuterol (2.5 MG/3ML) 0.083% nebulizer solution Commonly known as: PROVENTIL Take 3 mLs by nebulization 4 (four) times daily as needed for wheezing or shortness of breath.   amLODipine 10 MG tablet Commonly known as: NORVASC Take 10 mg by mouth daily.   aspirin EC 81 MG tablet Take 1 tablet (81 mg total) by mouth daily. Swallow whole.   atorvastatin 20 MG tablet Commonly known as: LIPITOR Take 20 mg by mouth every evening.   BD Pen Needle Nano 2nd Gen 32G X 4 MM Misc Generic drug: Insulin Pen Needle USE TO INJECT LANTUS UNDER THE SKIN EVERY MORNING   benzonatate 200 MG capsule Commonly known as: TESSALON Take 200 mg by mouth 3 (three) times daily as needed for cough.   clopidogrel 75 MG tablet Commonly known as: PLAVIX Take 1 tablet (75 mg total) by mouth daily.   DULoxetine 60 MG capsule Commonly known as: CYMBALTA Take 60 mg by mouth 2 (two) times daily.   Emgality 120 MG/ML Soaj Generic drug: Galcanezumab-gnlm ADMINISTER 1 ML UNDER THE SKIN EVERY 30 DAYS What changed: See the new instructions.   empagliflozin 10 MG Tabs tablet Commonly known as: Jardiance Take 1 tablet (10 mg total) by mouth daily with breakfast.   famciclovir 500 MG tablet Commonly known as: FAMVIR Take 500 mg by mouth 2 (two) times daily.   famotidine 20 MG tablet Commonly known as: PEPCID Take 1 tablet (20 mg total) by mouth every evening.   HumuLIN R U-500 KwikPen 500 UNIT/ML KwikPen Generic drug: insulin regular human CONCENTRATED 50Units, 30 minutes before each meal   Hydromet 5-1.5 MG/5ML syrup Generic drug: HYDROcodone bit-homatropine Hydromet 5 mg-1.5 mg/5 mL oral syrup  TAKE 5 ML BY MOUTH FOUR TIMES DAILY AS NEEDED   hydrOXYzine 25 MG  capsule Commonly known as: VISTARIL Take 25 mg by mouth 3 (three) times daily as needed for itching.   lamoTRIgine 150 MG tablet Commonly known as: LAMICTAL Take 200 mg by mouth at bedtime.   levothyroxine 137 MCG tablet Commonly known as: SYNTHROID Take 1 tablet (137 mcg total) by mouth daily before breakfast.   linaclotide 290 MCG Caps capsule Commonly known as: LINZESS Take 290 mcg by mouth daily before breakfast.   Magnesium 250 MG Tabs Take 750 mg by mouth at bedtime.  mirtazapine 7.5 MG tablet Commonly known as: REMERON Take 7.5 mg by mouth at bedtime.   montelukast 10 MG tablet Commonly known as: SINGULAIR Take 10 mg by mouth daily.   nicotine 14 mg/24hr patch Commonly known as: NICODERM CQ - dosed in mg/24 hours Place 1 patch (14 mg total) onto the skin daily.   onetouch ultrasoft lancets Check blood sugar once daily.   OneTouch Verio test strip Generic drug: glucose blood CHECK YOUR BLOOD SUGAR ONCE DAILY(VARY THE TIME OF DAY WHEN YOU CHECK BETWEEN, BEFORE THE 3 MEALS, AND AT BEDTIME)   OneTouch Verio w/Device Kit Check blood sugar once daily   promethazine 12.5 MG tablet Commonly known as: PHENERGAN Take 12.5 mg by mouth every 6 (six) hours as needed for nausea or vomiting.   Toujeo Max SoloStar 300 UNIT/ML Solostar Pen Generic drug: insulin glargine (2 Unit Dial) Inject 110 Units into the skin every morning. What changed: how much to take   zonisamide 50 MG capsule Commonly known as: ZONEGRAN Take 1 capsule (50 mg total) by mouth daily.        Allergies:  Allergies  Allergen Reactions   Gabapentin Other (See Comments)    unknown   Eggs Or Egg-Derived Products Other (See Comments)    Don't eat eggs   Ergotrate [Ergonovine] Other (See Comments)    Pt does not ever want   Ibuprofen Other (See Comments)    Patient has ulcer   Latex Other (See Comments)    Breaks out   Metformin And Related Other (See Comments)    Dizziness   Yutopar  [Ritodrine] Other (See Comments)    Pt does not ever want    Past Medical History:  Diagnosis Date   Allergy    Anemia    history of   Anxiety    Asthma    Depression    Diabetes mellitus    type 2    Fibromyalgia    Gastric ulcer    GERD (gastroesophageal reflux disease)    Graves disease    H/O therapeutic radiation    Migraine    Multiple thyroid nodules    Myofacial muscle pain    Osteoarthritis    Facet hypertrophy with injections   TIA (transient ischemic attack)    02/2015    Past Surgical History:  Procedure Laterality Date   AXILLARY LYMPH NODE DISSECTION  2001   CHOLECYSTECTOMY N/A 09/03/2018   Procedure: LAPAROSCOPIC CHOLECYSTECTOMY;  Surgeon: Coralie Keens, MD;  Location: [facility];  Service: General;  Laterality: N/A;   [place] Left 03/11/2017   Procedure: HYSTERECTOMY VAGINAL W/LEFT PROXIMAL SALPINGECTOMY;  Surgeon: Princess Bruins, MD;  Location: [facility];  Service: Gynecology;  Laterality: Left;  request 7:30am OR time  request 2 hours      Family History  Problem Relation Age of Onset   Hypertension Mother    Diabetes Mother    Cancer Mother        rectal   Stroke Father    Heart disease Father 51       MI x5   Hypertension Father    Diabetes Father    Breast cancer Sister        unsure of age , was between 47 and 62   Drug abuse Brother    Alcohol abuse Brother    Drug abuse Brother    Alcohol abuse Brother    Anxiety disorder Neg  Hx    Bipolar disorder Neg Hx    Depression Neg Hx     Social History:  reports that she has been smoking cigarettes. She has a 20.50 pack-year smoking history. She has never used smokeless tobacco. She reports that she does not currently use drugs. She reports that she does not drink alcohol.  Review of Systems:  Last diabetic eye exam date/23  Last foot exam date: 10/22  No recent urine microalbumin available  Hypertension:   Treatment  includes amlodipine but no ACE inhibitors or ARB drugs  BP Readings from Last 3 Encounters:  12/27/21 134/70  12/11/21 132/74  11/28/21 (!) 141/83    Lipids: Now treated with atorvastatin 20 mg daily    Lab Results  Component Value Date   CHOL 102 11/16/2021   CHOL 122 (L) 02/02/2016   CHOL 118 03/16/2015   Lab Results  Component Value Date   HDL 34 (L) 11/16/2021   HDL 41 (L) 02/02/2016   HDL 37 (L) 03/16/2015   Lab Results  Component Value Date   LDLCALC 28 11/16/2021   LDLCALC 55 02/02/2016   LDLCALC 58 03/16/2015   Lab Results  Component Value Date   TRIG 199 (H) 11/16/2021   TRIG 131 02/02/2016   TRIG 113 03/16/2015   Lab Results  Component Value Date   CHOLHDL 3.0 11/16/2021   CHOLHDL 3.0 02/02/2016   CHOLHDL 3.2 03/16/2015   Lab Results  Component Value Date   LDLDIRECT 60 11/08/2011   LDLDIRECT 57 11/16/2010   HYPOTHYROIDISM: She has had hypothyroidism since she had I-131 for Graves' disease in 2012  In 2022 she was taking 224 mcg levothyroxine and because of persistently low TSH her levothyroxine was reduced to 112 mcg during her hospital stay in 5/23 She is not taking 137 mcg as she just got finished with her previous supply and has only taken 1 dose of the new tablets  Lab Results  Component Value Date   TSH 11.91 (H) 12/11/2021   TSH 0.014 (L) 11/16/2021   TSH <0.010 (L) 11/15/2021   FREET4 0.76 12/11/2021   FREET4 1.61 (H) 11/16/2021   FREET4 1.40 (H) 11/15/2021   Lab Results  Component Value Date   TSH 11.91 (H) 12/11/2021   TSH 0.014 (L) 11/16/2021   TSH <0.010 (L) 11/15/2021   TSH 0.02 (L) 10/18/2021   TSH 0.13 (L) 08/30/2021   TSH 8.20 (H) 03/26/2021   TSH 32.31 (H) 11/22/2020    She takes Cymbalta and Remeron for depression   Examination:   BP 134/70   Pulse 80   Ht '5\' 4"'  (1.626 m)   Wt 221 lb 3.2 oz (100.3 kg)   LMP 12/06/2016 (Approximate)   SpO2 94%   BMI 37.97 kg/m   Body mass index is 37.97 kg/m.     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:   Current regimen: Humulin R 50 units 3 times daily and Toujeo 50 units daily; Jardiance 10 mg daily  A1c is last over 11%   Blood glucose control which was consistently poor is now much better with her recent Dexcom sensor showing 69% within target range  She is also able to see her blood sugar patterns better with the Dexcom sensor  Although she does need some adjustment of her mealtime dose based on her diet she generally not getting high readings  She does need some diabetes education and meal planning advice   Recommendations:  Discussed  adjustment of her Humulin R based on meal size instead of taking just 40 units with every meal Likely can take 40 units at lunchtime when she is eating a smaller meal and insulin more active However at dinnertime can go to 45-55 units based on total carbohydrate intake and meal size She can reduce her Toujeo also but not down to 40 units, she can try 46 units for now and continue using the Toujeo max  Continue increasing exercise  HYPOTHYROIDISM: She only is now starting her 137 mcg levothyroxine, last TSH high Thyroid levels will be checked on the next visit  Consider increasing Jardiance on the next visit  Follow-up BMP will be checked on the next visit   Patient Instructions  Humulin 40-55 before meals   Toujeo 46 units in am daily   Elayne Snare 12/27/2021, 12:04 PM

## 2021-12-27 NOTE — Telephone Encounter (Signed)
Spoke to patient, she may come back and pick up her paperwork.

## 2021-12-27 NOTE — Telephone Encounter (Signed)
Pt came in the office wanting to see if her FMLA paperwork was ready?

## 2022-01-01 ENCOUNTER — Other Ambulatory Visit: Payer: Self-pay | Admitting: Neurology

## 2022-01-02 ENCOUNTER — Encounter: Payer: 59 | Admitting: Occupational Therapy

## 2022-01-03 ENCOUNTER — Ambulatory Visit: Payer: 59

## 2022-01-03 DIAGNOSIS — R2689 Other abnormalities of gait and mobility: Secondary | ICD-10-CM | POA: Diagnosis present

## 2022-01-03 DIAGNOSIS — M6281 Muscle weakness (generalized): Secondary | ICD-10-CM | POA: Diagnosis present

## 2022-01-03 NOTE — Therapy (Signed)
OUTPATIENT PHYSICAL THERAPY NEURO TREATMENT   Patient Name: Kendra Hall MRN: [id number] DOB:Nov 15, 1965, 56 y.o., female Today's Date: 01/03/2022   PCP: Simona Huh, NP REFERRING PROVIDER: Thurnell Lose, MD    PT End of Session - 01/03/22 1630     Visit Number 4    Number of Visits 7    Date for PT Re-Evaluation 01/16/22    Authorization Type [employer]    PT Start Time 1627   late arrival   PT Stop Time 1700    PT Time Calculation (min) 33 min    Equipment Utilized During Treatment Gait belt    Activity Tolerance Other (comment)    Behavior During Therapy WFL for tasks assessed/performed               Past Medical History:  Diagnosis Date   Allergy    Anemia    history of   Anxiety    Asthma    Depression    Diabetes mellitus    type 2    Fibromyalgia    Gastric ulcer    GERD (gastroesophageal reflux disease)    Graves disease    H/O therapeutic radiation    Migraine    Multiple thyroid nodules    Myofacial muscle pain    Osteoarthritis    Facet hypertrophy with injections   TIA (transient ischemic attack)    02/2015   Past Surgical History:  Procedure Laterality Date   AXILLARY LYMPH NODE DISSECTION  2001   CHOLECYSTECTOMY N/A 09/03/2018   Procedure: LAPAROSCOPIC CHOLECYSTECTOMY;  Surgeon: Coralie Keens, MD;  Location: [facility];  Service: General;  Laterality: N/A;   [place] Left 03/11/2017   Procedure: HYSTERECTOMY VAGINAL W/LEFT PROXIMAL SALPINGECTOMY;  Surgeon: Princess Bruins, MD;  Location: [facility];  Service: Gynecology;  Laterality: Left;  request 7:30am OR time  request 2 hours     Patient Active Problem List   Diagnosis Date Noted   Stroke-like symptoms 11/15/2021   Dyslipidemia 11/15/2021   Cervical stenosis of spine 11/15/2021   Severe obstructive sleep apnea-hypopnea syndrome 03/22/2020   Sleep-related hypoxia 03/22/2020   Intractable episodic cluster headache  02/10/2020   Abnormal dreams 02/10/2020   Excessive daytime sleepiness 02/10/2020   Snoring 02/10/2020   Sleep related headaches 02/10/2020   Lt facial numbness 09/25/2017   Back pain 06/15/2017   Postoperative state 03/11/2017   Trigger point 11/28/2016   Health care maintenance 02/04/2016   Severe episode of recurrent major depressive disorder, with psychotic features ([place]) 08/22/2015   GAD (generalized anxiety disorder) 08/22/2015   PTSD (post-traumatic stress disorder) 08/22/2015   Insomnia 08/22/2015   GERD (gastroesophageal reflux disease) 04/18/2015   Fibromyalgia 03/29/2015   Hypothyroidism    Type 2 diabetes mellitus without complication ([place])    TIA (transient ischemic attack) 03/15/2015   Mild intermittent asthma 02/28/2012   Anxiety disorder 02/07/2012   Hypertension 01/07/2012   Hypothyroidism following radioiodine therapy 08/28/2011   Grave's disease 05/07/2011   Family hx-breast malignancy 11/20/2010   Chronic migraine without aura 05/04/2010   Left shoulder pain 09/08/2009   TOBACCO USER 03/11/2009   Notalgia 01/09/2009   Class 2 obesity due to excess calories with body mass index (BMI) of 37.0 to 37.9 in adult 09/27/2008   Mood disorder ([place]) 09/22/2008    ONSET DATE: 11/15/21  REFERRING DIAG: R47.81 (ICD-10-CM) - Slurred speech R29.90 (ICD-10-CM) - Stroke-like symptoms   THERAPY DIAG:  Muscle weakness (generalized)  Other abnormalities of gait and mobility  Rationale for Evaluation and Treatment Rehabilitation  SUBJECTIVE:                                                                                                                                                                                              SUBJECTIVE STATEMENT: "Back is feeling disgusting.  Having a lot of weight gain since starting new insulin medication. Back pain happens a lot more at night" Pt accompanied by: self  PERTINENT HISTORY: anemia, asthma, anxiety, depression, DM,  fibromyalgia, GERD, migraines, TIA  PAIN:  Are you having pain? Yes: NPRS scale: 8/10 Pain location: LB Pain description: tight Aggravating factors: walking Relieving factors: stretching, heat, muscle relaxants  PRECAUTIONS: Fall  PATIENT GOALS work on muscle tightness   OBJECTIVE:    TODAY'S TREATMENT: 01/03/22 Activity Comments  Seated Flexion Stretch with Swiss Ball  10 x 5 sec hold  Standing calf raise 2x10 Countertop support  Standing hip abduction 2x10 Leaning forward on counter for support of back  Sit to stand 2x5   Tourist information centre manager with Open Book 10x ea  Piriformis stretch Attempted in sitting and supine, body habitus limits participation  Pt education in resistance training and DM       PATIENT EDUCATION:  Person educated: Patient Education method: Explanation Education comprehension: verbalized understanding   HOME EXERCISE PROGRAM Last updated: 12/12/21 Access Code: [email or web address] URL: [email or web address] Date: 12/12/2021 Prepared by: [facility]  Exercises - Marching with Resistance  - 1 x daily - 5 x weekly - 2 sets - 10 reps - Standing Hip Abduction with Counter Support  - 1 x daily - 5 x weekly - 2 sets - 10 reps - Sit to Stand Without Arm Support  - 1 x daily - 5 x weekly - 2 sets - 10 reps - Heel Toe Raises with Unilateral Counter Support  - 1 x daily - 5 x weekly - 2 sets - 10 reps - Seated Flexion Stretch with Swiss Ball  - 1 x daily - 5 x weekly - 2 sets - 5 reps - 5 sec hold - Seated Piriformis Stretch  - 1 x daily - 5 x weekly - 2 sets - 30 sec hold - Seated Figure 4 Piriformis Stretch  - 1 x daily - 5 x weekly - 2 sets - 30 sec hold - Sidelying Thoracic Rotation with Open Book  - 1 x daily - 5 x weekly - 2 sets - 5 reps    Below measures were taken at time of initial  evaluation unless otherwise specified:   DIAGNOSTIC FINDINGS: 11/16/21 cervical MRI: mild-moderate C4-5 and C5-6  central canal stenosis, moderate-severe C4-5, C5-6, C6-7 neuroforaminal stenosis   11/15/21 brain MRI: Motion degraded examination without evidence of acute intracranial abnormality  COGNITION: Overall cognitive status: No family/caregiver present to determine baseline cognitive functioning   SENSATION: WFL  COORDINATION: Alt pronation/supination slightly dysmetric on L hand; B finger to nose and heel to shin normal   MUSCLE TONE: LLE: Within functional limits   POSTURE: rounded shoulders and increased lumbar lordosis   LOWER EXTREMITY ROM:     Active  Right Eval Left Eval  Hip flexion    Hip extension    Hip abduction    Hip adduction    Hip internal rotation    Hip external rotation    Knee flexion    Knee extension    Ankle dorsiflexion 24 20  Ankle plantarflexion    Ankle inversion    Ankle eversion     (Blank rows = not tested)  Active   Eval *pain with all movements  lumbar flexion Mid shin  Lumbar extension To neutral  R SBing Distal tibia  L SBing Distal tibia  R rotation 75% limited  L rotation 75% limited   (Blank rows = not tested)  *midline lumbar spine and L superior glute TTP; increased soft tissue restriction in B lumbar paraspinals, glutes and piriformis   LOWER EXTREMITY MMT:    MMT (in sitting) Right Eval Left Eval  Hip flexion 5 4-  Hip extension    Hip abduction 4+ 4  Hip adduction 4+ 4  Hip internal rotation    Hip external rotation    Knee flexion 4+ 4  Knee extension 4+ 4  Ankle dorsiflexion 5 4  Ankle plantarflexion 4+ 4  Ankle inversion    Ankle eversion    (Blank rows = not tested)  GAIT: Gait pattern:   , step to pattern, step through pattern, decreased step length- Right, and decreased stance time- Left Assistive device utilized: None Level of assistance: Complete Independence   FUNCTIONAL TESTs:  5 times sit to stand: 15.23 sec without Ues; unable to fully stand each time  Timed up and go (TUG): 15.87 sec    PATIENT SURVEYS:  BJSE [phone]    PATIENT EDUCATION: Education details: prognosis, POC, HEP Person educated: Patient Education method: Explanation, Demonstration, and Handouts Education comprehension: verbalized understanding   HOME EXERCISE PROGRAM: Access Code: [email or web address] URL: [email or web address] Date: 12/05/2021 Prepared by: [facility]  Exercises - Marching with Resistance  - 1 x daily - 5 x weekly - 2 sets - 10 reps - Standing Hip Abduction with Counter Support  - 1 x daily - 5 x weekly - 2 sets - 10 reps - Sit to Stand Without Arm Support  - 1 x daily - 5 x weekly - 2 sets - 10 reps - Heel Toe Raises with Unilateral Counter Support  - 1 x daily - 5 x weekly - 2 sets - 10 reps   GOALS: Goals reviewed with patient? Yes  SHORT TERM GOALS: Target date: 12/19/2021  Patient to be independent with initial HEP. Baseline: HEP initiated Goal status: IN PROGRESS    LONG TERM GOALS: Target date: 01/16/2022  Patient to be independent with advanced HEP. Baseline: Not yet initiated  Goal status: IN PROGRESS  Patient to demonstrate B LE strength >/=4+/5.  Baseline: See above Goal status: IN PROGRESS  Patient to  demonstrate lumbar AROM WFL and without pain limiting.  Baseline: see above Goal status: IN PROGRESS  Patient to report and demonstrate improved head, neck, and shoulder posture at rest and with activity.  Baseline: see above Goal status: IN PROGRESS  Patient to complete TUG in <14 sec with LRAD in order to decrease risk of falls.   Baseline: 15.87 sec Goal status: IN PROGRESS  Patient to demonstrate 5xSTS test in <15 sec in order to decrease risk of falls.  Baseline: 15.23 sec Goal status: IN PROGRESS  Patient to score at least 46/56 on Berg in order to decrease risk of falls.  Baseline: NT Goal status: IN PROGRESS    ASSESSMENT:  CLINICAL IMPRESSION:   Patient reports increased weight gain in  past month which makes activity and breathing more labored.  Poor HEP compliance as evidenced by need for 100% cues in activity and sequence for recall.  Continued sessions to progress HEP development and improve activity tolerance.     OBJECTIVE IMPAIRMENTS Abnormal gait, decreased activity tolerance, decreased balance, decreased coordination, difficulty walking, decreased ROM, decreased strength, impaired flexibility, improper body mechanics, postural dysfunction, and pain.   ACTIVITY LIMITATIONS carrying, lifting, bending, standing, squatting, stairs, and dressing  PARTICIPATION LIMITATIONS: meal prep, cleaning, laundry, shopping, community activity, and church  PERSONAL FACTORS Age, Fitness, Past/current experiences, Time since onset of injury/illness/exacerbation, and 3+ comorbidities: anemia, asthma, anxiety, depression, DM, fibromyalgia, GERD, migraines, TIA  are also affecting patient's functional outcome.   REHAB POTENTIAL: Good  CLINICAL DECISION MAKING: Evolving/moderate complexity  EVALUATION COMPLEXITY: Moderate  PLAN: PT FREQUENCY: 1x/week  PT DURATION: 6 weeks  PLANNED INTERVENTIONS: Therapeutic exercises, Therapeutic activity, Neuromuscular re-education, Balance training, Gait training, Patient/Family education, Joint mobilization, Stair training, Vestibular training, Canalith repositioning, Dry Needling, Electrical stimulation, Cryotherapy, Moist heat, Taping, Manual therapy, and Re-evaluation  PLAN FOR NEXT SESSION: progress L LE strengthening, hip and lumbar mobility   5:03 PM, 01/03/22 M. Sherlyn Lees, PT, DPT Physical Therapist- [place] Office Number: [phone]     [place] at [facility] [address], [place], [place] [address] Phone # [phone] Fax # [phone]

## 2022-01-04 ENCOUNTER — Encounter: Payer: Self-pay | Admitting: Endocrinology

## 2022-01-08 ENCOUNTER — Telehealth: Payer: Self-pay

## 2022-01-08 NOTE — Telephone Encounter (Signed)
Patient called to inform us that a program has changed her from [facility] to [place]

## 2022-01-09 ENCOUNTER — Encounter: Payer: 59 | Admitting: Occupational Therapy

## 2022-01-09 ENCOUNTER — Encounter: Payer: 59 | Attending: Endocrinology | Admitting: Nutrition

## 2022-01-09 ENCOUNTER — Telehealth: Payer: Self-pay | Admitting: Nutrition

## 2022-01-09 DIAGNOSIS — Z794 Long term (current) use of insulin: Secondary | ICD-10-CM | POA: Insufficient documentation

## 2022-01-09 DIAGNOSIS — Z713 Dietary counseling and surveillance: Secondary | ICD-10-CM | POA: Diagnosis not present

## 2022-01-09 DIAGNOSIS — E1165 Type 2 diabetes mellitus with hyperglycemia: Secondary | ICD-10-CM | POA: Diagnosis not present

## 2022-01-09 DIAGNOSIS — E119 Type 2 diabetes mellitus without complications: Secondary | ICD-10-CM

## 2022-01-09 NOTE — Progress Notes (Signed)
Patient is here to review her diet and insulin doses: Current insulin:  Toujeo 50u q.  Says forgets rarely, maybe 1 q 2 weeks.                               Humulin R-u500  40-50 units ac meals.  Pt. Reports taking this 15-30 minutes before eating.   Low blood sugars:  only once in last 2 weeks.  BS was 53 ac S last night.  Did not take toujeo that AM-forgot, and took 40u R acB, 50u R lunch at 1PM.  Ate sandwich, drank sweet tea, ate watermellon in the afternoon, blood sugar dropped to 53 at 6PM.  She did not take the R insulin and blood sugar went up to 300, but came down to 191 at 8 AM and now 176 at 9:30 AM.   SBGM:  says insurance prefers she wears the [place], but likes the dexcom better.  Currently wearing the [place].  Readings are linked to her phone and I linked them to this practice.  She was told to make sure she scans ac and HS.  She agreed to do this. ACTIVITY:  non due to back pain Patient reports that she is very upset at her weight gain.  Says is not eating that much that she should be gaining this weight. DIET: 7AM:  up, takes Toujeo and R.  Waits 15-30 minutes and eats breakfast of cereal-Cheerios, or eggs, toast and sometimes biscuits.   9-11 watermelon-2-3 servings for snack 11-12, bologna and cheese sandwich-sometimes with small amount of mayo.  No chips or pretzels. Regular soda or sweet tea 1/2 of the time 3-4 snacks on watermelon during the afternoon 6-8 meat, fried chicken, with fries or hamburger/cheeseburger  with fries  Sometimes sweet drink 10 bed.  Denies eating anything after supper or during the night Discussion: On large amounts of insulin due to weight and drinking sweet drinks and eating a lot of fried food and high fat foods.  If she wants weight loss, she will need to stop the sweet drinks, reduce the amount of R before meals and decrease the amount of fat in her meal:  stop fried food, reduce salad dressings and biscuits, and bake, not fry meats.  Limit hot dogs to 1  per meal, or sandwiches to 1, and limit reduce 12 inch sub to 6 inch with reduced oil on sandwich.   Also needs to start exercise program of chair exercises for 20 minutes per day and work up to 30 minutes 4-5 days/wk.   Discussed GLP1s , but said had to stop trulicity due to nausea, and constipation .  Discussed that when she starts this drug, she need to reduce the amount of fat to reduce the nausea and constipation. She reports that she has read about Mounjaro and feels like this might help her more, because when she was on the Trulicity, it did not curb her appetite.  I explained that she will need to discuss this with Dr. Dwyane Dee at next visit.

## 2022-01-09 NOTE — Telephone Encounter (Signed)
Saw patient today.  Takes 50u Toujeo q AM, U-500R 40-50u tid ac meals. Did not take toujeo yesterday.  Took 40u of R acB, 50 acL and blood sugar dropped to 53 acS.  Did not take R acS, ate 3 pieces of pizza, and FBS today was 191, and dropped to 176 while here-Ate no bfast this am and no Toujeo. Advised her to reduce her dose of R to 40 ac meals, and reduce toujeo to 40 as well.  Please advise

## 2022-01-09 NOTE — Patient Instructions (Addendum)
Stop all sweet drinks and switch to 0 calorie drinks like flavored waters and diet beverages Stop all cold cereals and milk Limit fried foods to 1-2 X/wk and only one high fat food in the meal. Start chair exercises for 30 minutes 2X/wk and work up to 4-5 X/wk.   Reduce Toujeo to 40u  daily Reduce R insulin to 35 if not drinking sweet drinks, and 40 if drinking sweet drinks or meal is high in fat-like fried foods.

## 2022-01-09 NOTE — Telephone Encounter (Signed)
Patient was told to reduce her Toujeo and R doses as per dr. Ronnie Derby message.  She re verbalized the new dosages correctly.  She also asked if she can start [facility], but was told she needs to discuss this with Dr. Dwyane Dee at her next appointment in 2 weeks.

## 2022-01-10 ENCOUNTER — Ambulatory Visit: Payer: 59 | Admitting: Physical Therapy

## 2022-01-11 ENCOUNTER — Other Ambulatory Visit: Payer: Self-pay

## 2022-01-11 DIAGNOSIS — E1165 Type 2 diabetes mellitus with hyperglycemia: Secondary | ICD-10-CM

## 2022-01-11 MED ORDER — EMPAGLIFLOZIN 10 MG PO TABS: 10.0000 mg | ORAL_TABLET | Freq: Every day | ORAL | 3 refills | Status: DC

## 2022-01-16 ENCOUNTER — Encounter: Payer: 59 | Admitting: Occupational Therapy

## 2022-01-21 ENCOUNTER — Other Ambulatory Visit: Payer: Self-pay

## 2022-01-21 DIAGNOSIS — E1165 Type 2 diabetes mellitus with hyperglycemia: Secondary | ICD-10-CM

## 2022-01-21 MED ORDER — HUMULIN R U-500 KWIKPEN 500 UNIT/ML ~~LOC~~ SOPN: PEN_INJECTOR | SUBCUTANEOUS | 2 refills | Status: DC

## 2022-01-22 NOTE — Therapy (Signed)
OUTPATIENT PHYSICAL THERAPY NEURO TREATMENT   Patient Name: Kendra Hall MRN: [id number] DOB:[birth year], 56 y.o., female Today's Date: 01/22/2022   PCP: Simona Huh, NP REFERRING PROVIDER: Thurnell Lose, MD        Past Medical History:  Diagnosis Date   Allergy    Anemia    history of   Anxiety    Asthma    Depression    Diabetes mellitus    type 2    Fibromyalgia    Gastric ulcer    GERD (gastroesophageal reflux disease)    Graves disease    H/O therapeutic radiation    Migraine    Multiple thyroid nodules    Myofacial muscle pain    Osteoarthritis    Facet hypertrophy with injections   TIA (transient ischemic attack)    02/2015   Past Surgical History:  Procedure Laterality Date   AXILLARY LYMPH NODE DISSECTION  2001   CHOLECYSTECTOMY N/A 09/03/2018   Procedure: LAPAROSCOPIC CHOLECYSTECTOMY;  Surgeon: Coralie Keens, MD;  Location: [facility];  Service: General;  Laterality: N/A;   [place] Left 03/11/2017   Procedure: HYSTERECTOMY VAGINAL W/LEFT PROXIMAL SALPINGECTOMY;  Surgeon: Princess Bruins, MD;  Location: [facility];  Service: Gynecology;  Laterality: Left;  request 7:30am OR time  request 2 hours     Patient Active Problem List   Diagnosis Date Noted   Stroke-like symptoms 11/15/2021   Dyslipidemia 11/15/2021   Cervical stenosis of spine 11/15/2021   Severe obstructive sleep apnea-hypopnea syndrome 03/22/2020   Sleep-related hypoxia 03/22/2020   Intractable episodic cluster headache 02/10/2020   Abnormal dreams 02/10/2020   Excessive daytime sleepiness 02/10/2020   Snoring 02/10/2020   Sleep related headaches 02/10/2020   Lt facial numbness 09/25/2017   Back pain 06/15/2017   Postoperative state 03/11/2017   Trigger point 11/28/2016   Health care maintenance 02/04/2016   Severe episode of recurrent major depressive disorder, with psychotic features ([place]) 08/22/2015   GAD  (generalized anxiety disorder) 08/22/2015   PTSD (post-traumatic stress disorder) 08/22/2015   Insomnia 08/22/2015   GERD (gastroesophageal reflux disease) 04/18/2015   Fibromyalgia 03/29/2015   Hypothyroidism    Type 2 diabetes mellitus without complication ([place])    TIA (transient ischemic attack) 03/15/2015   Mild intermittent asthma 02/28/2012   Anxiety disorder 02/07/2012   Hypertension 01/07/2012   Hypothyroidism following radioiodine therapy 08/28/2011   Grave's disease 05/07/2011   Family hx-breast malignancy 11/20/2010   Chronic migraine without aura 05/04/2010   Left shoulder pain 09/08/2009   TOBACCO USER 03/11/2009   Notalgia 01/09/2009   Class 2 obesity due to excess calories with body mass index (BMI) of 37.0 to 37.9 in adult 09/27/2008   Mood disorder ([place]) 09/22/2008    ONSET DATE: 11/15/21  REFERRING DIAG: R47.81 (ICD-10-CM) - Slurred speech R29.90 (ICD-10-CM) - Stroke-like symptoms   THERAPY DIAG:  No diagnosis found.  Rationale for Evaluation and Treatment Rehabilitation  SUBJECTIVE:  SUBJECTIVE STATEMENT: "Back is feeling disgusting.  Having a lot of weight gain since starting new insulin medication. Back pain happens a lot more at night" Pt accompanied by: self  PERTINENT HISTORY: anemia, asthma, anxiety, depression, DM, fibromyalgia, GERD, migraines, TIA  PAIN:  Are you having pain? Yes: NPRS scale: 8/10 Pain location: LB Pain description: tight Aggravating factors: walking Relieving factors: stretching, heat, muscle relaxants  PRECAUTIONS: Fall  PATIENT GOALS work on muscle tightness   OBJECTIVE:     TODAY'S TREATMENT: 01/23/22 Activity Comments                         HOME EXERCISE PROGRAM Last updated: 12/12/21 Access Code: [email or web address] URL:  [email or web address] Date: 12/12/2021 Prepared by: [facility]  Exercises - Marching with Resistance  - 1 x daily - 5 x weekly - 2 sets - 10 reps - Standing Hip Abduction with Counter Support  - 1 x daily - 5 x weekly - 2 sets - 10 reps - Sit to Stand Without Arm Support  - 1 x daily - 5 x weekly - 2 sets - 10 reps - Heel Toe Raises with Unilateral Counter Support  - 1 x daily - 5 x weekly - 2 sets - 10 reps - Seated Flexion Stretch with Swiss Ball  - 1 x daily - 5 x weekly - 2 sets - 5 reps - 5 sec hold - Seated Piriformis Stretch  - 1 x daily - 5 x weekly - 2 sets - 30 sec hold - Seated Figure 4 Piriformis Stretch  - 1 x daily - 5 x weekly - 2 sets - 30 sec hold - Sidelying Thoracic Rotation with Open Book  - 1 x daily - 5 x weekly - 2 sets - 5 reps    Below measures were taken at time of initial evaluation unless otherwise specified:   DIAGNOSTIC FINDINGS: 11/16/21 cervical MRI: mild-moderate C4-5 and C5-6 central canal stenosis, moderate-severe C4-5, C5-6, C6-7 neuroforaminal stenosis   11/15/21 brain MRI: Motion degraded examination without evidence of acute intracranial abnormality  COGNITION: Overall cognitive status: No family/caregiver present to determine baseline cognitive functioning   SENSATION: WFL  COORDINATION: Alt pronation/supination slightly dysmetric on L hand; B finger to nose and heel to shin normal   MUSCLE TONE: LLE: Within functional limits   POSTURE: rounded shoulders and increased lumbar lordosis   LOWER EXTREMITY ROM:     Active  Right Eval Left Eval  Hip flexion    Hip extension    Hip abduction    Hip adduction    Hip internal rotation    Hip external rotation    Knee flexion    Knee extension    Ankle dorsiflexion 24 20  Ankle plantarflexion    Ankle inversion    Ankle eversion     (Blank rows = not tested)  Active   Eval *pain with all movements  lumbar flexion Mid shin   Lumbar extension To neutral  R SBing Distal tibia  L SBing Distal tibia  R rotation 75% limited  L rotation 75% limited   (Blank rows = not tested)  *midline lumbar spine and L superior glute TTP; increased soft tissue restriction in B lumbar paraspinals, glutes and piriformis   LOWER EXTREMITY MMT:    MMT (in sitting) Right Eval Left Eval  Hip flexion 5 4-  Hip extension    Hip abduction 4+ 4  Hip adduction 4+ 4  Hip internal rotation    Hip external rotation    Knee flexion 4+ 4  Knee extension 4+ 4  Ankle dorsiflexion 5 4  Ankle plantarflexion 4+ 4  Ankle inversion    Ankle eversion    (Blank rows = not tested)  GAIT: Gait pattern:   , step to pattern, step through pattern, decreased step length- Right, and decreased stance time- Left Assistive device utilized: None Level of assistance: Complete Independence   FUNCTIONAL TESTs:  5 times sit to stand: 15.23 sec without Ues; unable to fully stand each time  Timed up and go (TUG): 15.87 sec   PATIENT SURVEYS:  DEYC 14.4818    PATIENT EDUCATION: Education details: prognosis, POC, HEP Person educated: Patient Education method: Explanation, Demonstration, and Handouts Education comprehension: verbalized understanding   HOME EXERCISE PROGRAM: Access Code: [email or web address] URL: [email or web address] Date: 12/05/2021 Prepared by: [facility]  Exercises - Marching with Resistance  - 1 x daily - 5 x weekly - 2 sets - 10 reps - Standing Hip Abduction with Counter Support  - 1 x daily - 5 x weekly - 2 sets - 10 reps - Sit to Stand Without Arm Support  - 1 x daily - 5 x weekly - 2 sets - 10 reps - Heel Toe Raises with Unilateral Counter Support  - 1 x daily - 5 x weekly - 2 sets - 10 reps   GOALS: Goals reviewed with patient? Yes  SHORT TERM GOALS: Target date: 12/19/2021  Patient to be independent with initial HEP. Baseline: HEP initiated Goal status: IN  PROGRESS    LONG TERM GOALS: Target date: 01/16/2022  Patient to be independent with advanced HEP. Baseline: Not yet initiated  Goal status: IN PROGRESS  Patient to demonstrate B LE strength >/=4+/5.  Baseline: See above Goal status: IN PROGRESS  Patient to demonstrate lumbar AROM WFL and without pain limiting.  Baseline: see above Goal status: IN PROGRESS  Patient to report and demonstrate improved head, neck, and shoulder posture at rest and with activity.  Baseline: see above Goal status: IN PROGRESS  Patient to complete TUG in <14 sec with LRAD in order to decrease risk of falls.   Baseline: 15.87 sec Goal status: IN PROGRESS  Patient to demonstrate 5xSTS test in <15 sec in order to decrease risk of falls.  Baseline: 15.23 sec Goal status: IN PROGRESS  Patient to score at least 46/56 on Berg in order to decrease risk of falls.  Baseline: NT Goal status: IN PROGRESS    ASSESSMENT:  CLINICAL IMPRESSION:   Patient reports increased weight gain in past month which makes activity and breathing more labored.  Poor HEP compliance as evidenced by need for 100% cues in activity and sequence for recall.  Continued sessions to progress HEP development and improve activity tolerance.     OBJECTIVE IMPAIRMENTS Abnormal gait, decreased activity tolerance, decreased balance, decreased coordination, difficulty walking, decreased ROM, decreased strength, impaired flexibility, improper body mechanics, postural dysfunction, and pain.   ACTIVITY LIMITATIONS carrying, lifting, bending, standing, squatting, stairs, and dressing  PARTICIPATION LIMITATIONS: meal prep, cleaning, laundry, shopping, community activity, and church  PERSONAL FACTORS Age, Fitness, Past/current experiences, Time since onset of injury/illness/exacerbation, and 3+ comorbidities: anemia, asthma, anxiety, depression, DM, fibromyalgia, GERD, migraines, TIA  are also affecting patient's functional outcome.   REHAB  POTENTIAL: Good  CLINICAL DECISION MAKING: Evolving/moderate complexity  EVALUATION COMPLEXITY: Moderate  PLAN: PT FREQUENCY: 1x/week  PT DURATION: 6 weeks  PLANNED INTERVENTIONS: Therapeutic exercises, Therapeutic activity, Neuromuscular re-education, Balance training, Gait training, Patient/Family education, Joint mobilization, Stair training, Vestibular training, Canalith repositioning, Dry Needling, Electrical stimulation, Cryotherapy, Moist heat, Taping, Manual therapy, and Re-evaluation  PLAN FOR NEXT SESSION: progress L LE strengthening, hip and lumbar mobility   10:49 AM, 01/22/22 M. Sherlyn Lees, PT, DPT Physical Therapist- [place] Office Number: [phone]     [place] at [facility] [address] [place], [place] [address] Phone # [phone] Fax # [phone]

## 2022-01-23 ENCOUNTER — Ambulatory Visit: Payer: 59 | Attending: Internal Medicine | Admitting: Physical Therapy

## 2022-01-23 ENCOUNTER — Encounter: Payer: Self-pay | Admitting: Physical Therapy

## 2022-01-23 DIAGNOSIS — M62830 Muscle spasm of back: Secondary | ICD-10-CM | POA: Diagnosis present

## 2022-01-23 DIAGNOSIS — R2689 Other abnormalities of gait and mobility: Secondary | ICD-10-CM | POA: Insufficient documentation

## 2022-01-23 DIAGNOSIS — M6281 Muscle weakness (generalized): Secondary | ICD-10-CM | POA: Insufficient documentation

## 2022-01-24 ENCOUNTER — Ambulatory Visit: Payer: 59 | Admitting: Endocrinology

## 2022-02-04 NOTE — Progress Notes (Signed)
DMV placard form received. Form place on Dr.Jaffe desk.

## 2022-02-05 ENCOUNTER — Encounter: Payer: Self-pay | Admitting: Endocrinology

## 2022-02-05 ENCOUNTER — Ambulatory Visit (INDEPENDENT_AMBULATORY_CARE_PROVIDER_SITE_OTHER): Payer: 59 | Admitting: Endocrinology

## 2022-02-05 VITALS — BP 130/78 | HR 86 | Ht 64.0 in | Wt 222.4 lb

## 2022-02-05 DIAGNOSIS — E1165 Type 2 diabetes mellitus with hyperglycemia: Secondary | ICD-10-CM | POA: Diagnosis not present

## 2022-02-05 DIAGNOSIS — Z794 Long term (current) use of insulin: Secondary | ICD-10-CM | POA: Diagnosis not present

## 2022-02-05 DIAGNOSIS — E890 Postprocedural hypothyroidism: Secondary | ICD-10-CM | POA: Diagnosis not present

## 2022-02-05 LAB — BASIC METABOLIC PANEL
BUN: 19 mg/dL (ref 6–23)
CO2: 26 mEq/L (ref 19–32)
Calcium: 9.7 mg/dL (ref 8.4–10.5)
Chloride: 100 mEq/L (ref 96–112)
Creatinine, Ser: 1.07 mg/dL (ref 0.40–1.20)
GFR: 58.17 mL/min — ABNORMAL LOW (ref >60.00)
Glucose, Bld: 148 mg/dL — ABNORMAL HIGH (ref 70–99)
Potassium: 4.3 mEq/L (ref 3.5–5.1)
Sodium: 138 mEq/L (ref 135–145)

## 2022-02-05 LAB — POCT GLYCOSYLATED HEMOGLOBIN (HGB A1C): Hemoglobin A1C: 8.2 % — AB (ref 4.0–5.6)

## 2022-02-05 LAB — TSH: TSH: 1.43 u[IU]/mL (ref 0.35–5.50)

## 2022-02-05 LAB — T4, FREE: Free T4: 1.14 ng/dL (ref 0.60–1.60)

## 2022-02-05 NOTE — Patient Instructions (Addendum)
30-40 at lunch and 25-30 at supper of R insulin  Mounjaro 2.'5mg'$  weekly for 4 weekly

## 2022-02-05 NOTE — Progress Notes (Signed)
Patient ID: Kendra Hall, female   DOB: 1966-02-22, 56 y.o.   MRN: [id number]           Reason for Appointment: Endocrinology follow-up   History of Present Illness   Diagnosis date: 2012  Previous history:  hypoglycemic drugs previously used : Trulicity, Invokana, glimepiride, Prandin and Victoza Insulin was started in 2020  Her blood sugars have been progressively higher since 2019 A1c range in the last few years is: 6-12.7  Recent history:     Non-insulin hypoglycemic drugs: Jardiance 10 mg daily     Insulin regimen: Toujeo 30 units in the morning daily, Humulin R U-500 insulin, 30-units before meals         Side effects from medications: Vomiting and constipation from Trulicity  Current self management, blood sugar patterns and problems identified:  A1c was  11.4 and now 8.2 She started using the freestyle libre 2 sensor now because the Dexcom was denied by insurance She has seen the diabetes educator Since then she has made some changes in her diet Also trying to do a little more walking at the grocery stores With this her blood sugars have been improving and she has been able to cut back on her insulin doses across-the-board She is usually getting some carbohydrates with all meals although occasionally may skip breakfast Previously had been gaining weight and now this is down 6 pounds Although she thinks that Crystal light is making her sugar go up she still has some high readings after meals despite stopping this She is continues to take Jardiance 10 mg, renal function pending      Interpretation of her freestyle [employer] shows the following  Compared to her last visit her time in range is slightly worse at 61% HYPERGLYCEMIA is present at variable times on different days and mostly postprandial  HIGHEST blood sugars are usually in the afternoon after lunch  LOWEST blood sugars appear to be before and right after dinnertime including occasional mild  hypoglycemia OVERNIGHT blood sugars are variable and still running high but occasionally may be low normal at different times POSTPRANDIAL readings are at times higher after breakfast and dinner but generally more often after lunch but not daily  In the last few days has had a few low normal readings after dinner also Hypoglycemia only seen occasionally around dinner as above  CGM use % of time 98  2-week average/GV 171  Time in range        631%  % Time Above 180 25  % Time above 250 12  % Time Below 70 2     PRE-MEAL Fasting Lunch Dinner Bedtime Overall  Glucose range:       Averages: 163 168      POST-MEAL PC Breakfast PC Lunch PC Dinner  Glucose range:     Averages: 184 229 177   Previously:  CGM use % of time 57  2-week average/GV 153  Time in range      69%  % Time Above 180 21+7  % Time above 250   % Time Below 70 2     EXERCISE: She is some walking at store           Dietician visit: Most recent: 7/23  Weight control: Previously maximum was 218  Wt Readings from Last 3 Encounters:  02/05/22 222 lb 6.4 oz (100.9 kg)  01/09/22 228 lb (103.4 kg)  12/27/21 221 lb 3.2 oz (100.3 kg)  Diabetes labs:  Lab Results  Component Value Date   HGBA1C 11.4 (A) 12/11/2021   HGBA1C 11.0 (A) 10/18/2021   HGBA1C 12.2 (A) 08/09/2021   Lab Results  Component Value Date   LDLCALC 28 11/16/2021   CREATININE 1.13 12/11/2021     Allergies as of 02/05/2022       Reactions   Gabapentin Other (See Comments)   unknown   Eggs Or Egg-derived Products Other (See Comments)   Don't eat eggs   Ergotrate [ergonovine] Other (See Comments)   Pt does not ever want   Ibuprofen Other (See Comments)   Patient has ulcer   Latex Other (See Comments)   Breaks out   Metformin And Related Other (See Comments)   Dizziness   Yutopar [ritodrine] Other (See Comments)   Pt does not ever want        Medication List        Accurate as of February 05, 2022  1:33 PM. If you  have any questions, ask your nurse or doctor.          acetaminophen 500 MG tablet Commonly known as: TYLENOL Take 1,000 mg by mouth every 6 (six) hours as needed for moderate pain.   albuterol 108 (90 Base) MCG/ACT inhaler Commonly known as: ProAir HFA Inhale 2 puffs into the lungs every 4 (four) hours as needed for wheezing.   albuterol (2.5 MG/3ML) 0.083% nebulizer solution Commonly known as: PROVENTIL Take 3 mLs by nebulization 4 (four) times daily as needed for wheezing or shortness of breath.   amLODipine 10 MG tablet Commonly known as: NORVASC Take 10 mg by mouth daily.   aspirin EC 81 MG tablet Take 1 tablet (81 mg total) by mouth daily. Swallow whole.   atorvastatin 20 MG tablet Commonly known as: LIPITOR Take 20 mg by mouth every evening.   BD Pen Needle Nano 2nd Gen 32G X 4 MM Misc Generic drug: Insulin Pen Needle USE TO INJECT LANTUS UNDER THE SKIN EVERY MORNING   benzonatate 200 MG capsule Commonly known as: TESSALON Take 200 mg by mouth 3 (three) times daily as needed for cough.   clopidogrel 75 MG tablet Commonly known as: PLAVIX Take 1 tablet (75 mg total) by mouth daily.   DULoxetine 60 MG capsule Commonly known as: CYMBALTA Take 60 mg by mouth 2 (two) times daily.   Emgality 120 MG/ML Soaj Generic drug: Galcanezumab-gnlm ADMINISTER 1 ML UNDER THE SKIN EVERY 30 DAYS   empagliflozin 10 MG Tabs tablet Commonly known as: Jardiance Take 1 tablet (10 mg total) by mouth daily with breakfast.   famciclovir 500 MG tablet Commonly known as: FAMVIR Take 500 mg by mouth 2 (two) times daily.   famotidine 20 MG tablet Commonly known as: PEPCID Take 1 tablet (20 mg total) by mouth every evening.   HumuLIN R U-500 KwikPen 500 UNIT/ML KwikPen Generic drug: insulin regular human CONCENTRATED ADMINISTER 50 UNITS UNDER THE SKIN 30 MINUTES BEFORE EACH MEAL   Hydromet 5-1.5 MG/5ML syrup Generic drug: HYDROcodone bit-homatropine Hydromet 5 mg-1.5 mg/5 mL  oral syrup  TAKE 5 ML BY MOUTH FOUR TIMES DAILY AS NEEDED   hydrOXYzine 25 MG capsule Commonly known as: VISTARIL Take 25 mg by mouth 3 (three) times daily as needed for itching.   lamoTRIgine 150 MG tablet Commonly known as: LAMICTAL Take 200 mg by mouth at bedtime.   levothyroxine 137 MCG tablet Commonly known as: SYNTHROID Take 1 tablet (137 mcg total) by mouth daily before breakfast.  linaclotide 290 MCG Caps capsule Commonly known as: LINZESS Take 290 mcg by mouth daily before breakfast.   Magnesium 250 MG Tabs Take 750 mg by mouth at bedtime.   mirtazapine 7.5 MG tablet Commonly known as: REMERON Take 7.5 mg by mouth at bedtime.   montelukast 10 MG tablet Commonly known as: SINGULAIR Take 10 mg by mouth daily.   nicotine 14 mg/24hr patch Commonly known as: NICODERM CQ - dosed in mg/24 hours Place 1 patch (14 mg total) onto the skin daily.   onetouch ultrasoft lancets Check blood sugar once daily.   OneTouch Verio test strip Generic drug: glucose blood CHECK YOUR BLOOD SUGAR ONCE DAILY(VARY THE TIME OF DAY WHEN YOU CHECK BETWEEN, BEFORE THE 3 MEALS, AND AT BEDTIME)   OneTouch Verio w/Device Kit Check blood sugar once daily   promethazine 12.5 MG tablet Commonly known as: PHENERGAN Take 12.5 mg by mouth every 6 (six) hours as needed for nausea or vomiting.   Toujeo Max SoloStar 300 UNIT/ML Solostar Pen Generic drug: insulin glargine (2 Unit Dial) Inject 110 Units into the skin every morning. What changed: how much to take   zonisamide 50 MG capsule Commonly known as: ZONEGRAN Take 1 capsule (50 mg total) by mouth daily.        Allergies:  Allergies  Allergen Reactions   Gabapentin Other (See Comments)    unknown   Eggs Or Egg-Derived Products Other (See Comments)    Don't eat eggs   Ergotrate [Ergonovine] Other (See Comments)    Pt does not ever want   Ibuprofen Other (See Comments)    Patient has ulcer   Latex Other (See Comments)     Breaks out   Metformin And Related Other (See Comments)    Dizziness   Yutopar [Ritodrine] Other (See Comments)    Pt does not ever want    Past Medical History:  Diagnosis Date   Allergy    Anemia    history of   Anxiety    Asthma    Depression    Diabetes mellitus    type 2    Fibromyalgia    Gastric ulcer    GERD (gastroesophageal reflux disease)    Graves disease    H/O therapeutic radiation    Migraine    Multiple thyroid nodules    Myofacial muscle pain    Osteoarthritis    Facet hypertrophy with injections   TIA (transient ischemic attack)    02/2015    Past Surgical History:  Procedure Laterality Date   AXILLARY LYMPH NODE DISSECTION  2001   CHOLECYSTECTOMY N/A 09/03/2018   Procedure: LAPAROSCOPIC CHOLECYSTECTOMY;  Surgeon: Coralie Keens, MD;  Location: [facility];  Service: General;  Laterality: N/A;   [place] Left 03/11/2017   Procedure: HYSTERECTOMY VAGINAL W/LEFT PROXIMAL SALPINGECTOMY;  Surgeon: Princess Bruins, MD;  Location: [facility];  Service: Gynecology;  Laterality: Left;  request 7:30am OR time  request 2 hours      Family History  Problem Relation Age of Onset   Hypertension Mother    Diabetes Mother    Cancer Mother        rectal   Stroke Father    Heart disease Father 34       MI x5   Hypertension Father    Diabetes Father    Breast cancer Sister        unsure of age , was between  55 and 30   Drug abuse Brother    Alcohol abuse Brother    Drug abuse Brother    Alcohol abuse Brother    Anxiety disorder Neg Hx    Bipolar disorder Neg Hx    Depression Neg Hx     Social History:  reports that she has been smoking cigarettes. She has a 20.50 pack-year smoking history. She has never used smokeless tobacco. She reports that she does not currently use drugs. She reports that she does not drink alcohol.  Review of Systems:  Last diabetic eye exam date 8/23  Last foot exam  date: 10/22  No recent urine microalbumin available  Hypertension:   Treatment includes amlodipine but no ACE inhibitors or ARB drugs  BP Readings from Last 3 Encounters:  02/05/22 130/78  12/27/21 134/70  12/11/21 132/74    Lipids: Now treated with atorvastatin 20 mg daily    Lab Results  Component Value Date   CHOL 102 11/16/2021   CHOL 122 (L) 02/02/2016   CHOL 118 03/16/2015   Lab Results  Component Value Date   HDL 34 (L) 11/16/2021   HDL 41 (L) 02/02/2016   HDL 37 (L) 03/16/2015   Lab Results  Component Value Date   LDLCALC 28 11/16/2021   LDLCALC 55 02/02/2016   LDLCALC 58 03/16/2015   Lab Results  Component Value Date   TRIG 199 (H) 11/16/2021   TRIG 131 02/02/2016   TRIG 113 03/16/2015   Lab Results  Component Value Date   CHOLHDL 3.0 11/16/2021   CHOLHDL 3.0 02/02/2016   CHOLHDL 3.2 03/16/2015   Lab Results  Component Value Date   LDLDIRECT 60 11/08/2011   LDLDIRECT 57 11/16/2010   HYPOTHYROIDISM: She has had hypothyroidism since she had I-131 for Graves' disease in 2012  In 2022 she was taking 224 mcg levothyroxine and because of persistently low TSH her levothyroxine was reduced to 112 mcg during her hospital stay in 5/23 She is not taking 137 mcg as she just got finished with her previous supply and has only taken 1 dose of the new tablets  Lab Results  Component Value Date   TSH 11.91 (H) 12/11/2021   TSH 0.014 (L) 11/16/2021   TSH <0.010 (L) 11/15/2021   FREET4 0.76 12/11/2021   FREET4 1.61 (H) 11/16/2021   FREET4 1.40 (H) 11/15/2021   Lab Results  Component Value Date   TSH 11.91 (H) 12/11/2021   TSH 0.014 (L) 11/16/2021   TSH <0.010 (L) 11/15/2021   TSH 0.02 (L) 10/18/2021   TSH 0.13 (L) 08/30/2021   TSH 8.20 (H) 03/26/2021   TSH 32.31 (H) 11/22/2020    She takes Cymbalta and Remeron for depression   Examination:   BP 130/78   Pulse 86   Ht '5\' 4"'  (1.626 m)   Wt 222 lb 6.4 oz (100.9 kg)   LMP 12/06/2016 (Approximate)    SpO2 98%   BMI 38.17 kg/m   Body mass index is 38.17 kg/m.    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:   Current regimen: Humulin R 50 units 3 times daily and Toujeo 50 units daily; Jardiance 10 mg daily  A1c is 8.2, previously over 11%   Blood glucose control is about the same although she is requiring less insulin Recent GMI 7.4 Not clear if her freestyle Elenor Legato is the accurate However her postprandial readings are less consistently controlled especially for lunch Diet may be improving and  she is losing weight She is also getting relatively good readings overnight although still fluctuation is present  Not clear as yet if she may benefit from using a rapid acting insulin instead of Humulin R for postprandial control However gradually is likely helping her overnight readings also  BMI is now 38  Recommendations:  Again discussed adjustment of her Humulin R based on meal size especially at lunch and dinner  However for most meals she will likely need to take 40 units at lunchtime and again 30 minutes before eating  Most likely she can reduce her suppertime doses to 25 units unless eating more carbohydrates  No change in Toujeo 30 units also  Continue regular walking  Since she is willing to try a GLP type drug again to help with better control and insulin reduction we will give her a trial of 2.5 mg Mounjaro weekly  Discussed with the patient the action of GIP/GLP-1 drugs, the effects on pancreatic and liver function, effects on brain and stomach with improved satiety, slowing gastric emptying, improving satiety and reducing liver glucose output.  Discussed the effects on promoting weight loss. Explained possible side effects of MOUNJARO, most commonly nausea that usually improves over time; discussed safety information in package insert.  Demonstrated the medication injection device If she has no side effects with the 2.5 she will increase the dose  to 5 mg weekly and request a prescription in 3 weeks Likely will need to reduce her Humulin when she is on therapeutic dose of Mounjaro Patient brochure on Mounjaro given  Renal function will need to be checked today  HYPOTHYROIDISM: She is taking her 137 mcg levothyroxine since last visit, last TSH high Thyroid levels will be checked    Patient Instructions  30-40 at lunch and 25-30 at supper of R insulin     Irais Mottram 02/05/2022, 1:33 PM

## 2022-02-06 LAB — FRUCTOSAMINE: Fructosamine: 250 umol/L (ref 0–285)

## 2022-02-07 ENCOUNTER — Encounter: Payer: Self-pay | Admitting: Endocrinology

## 2022-02-13 ENCOUNTER — Telehealth: Payer: Self-pay

## 2022-02-13 DIAGNOSIS — Z794 Long term (current) use of insulin: Secondary | ICD-10-CM

## 2022-02-13 NOTE — Telephone Encounter (Signed)
Patient stated that grandson threw one of her mounjaro pens away. She has one more left to inject. She has inject 2.'5mg'$  for the past two weeks.Please advise

## 2022-02-14 ENCOUNTER — Ambulatory Visit: Payer: 59 | Admitting: Neurology

## 2022-02-15 MED ORDER — TIRZEPATIDE 5 MG/0.5ML ~~LOC~~ SOAJ: 5.0000 mg | SUBCUTANEOUS | 1 refills | Status: DC

## 2022-02-15 NOTE — Addendum Note (Signed)
Addended by: Cinda Quest on: 02/15/2022 01:54 PM   Modules accepted: Orders

## 2022-03-04 ENCOUNTER — Other Ambulatory Visit: Payer: Self-pay | Admitting: Neurology

## 2022-03-07 ENCOUNTER — Telehealth: Payer: Self-pay

## 2022-03-07 ENCOUNTER — Ambulatory Visit: Payer: 59 | Admitting: Dietician

## 2022-03-07 NOTE — Telephone Encounter (Signed)
Called states that her Elenor Legato is alarming a lot. States that it went off 14 times last night and she could not sleep. She has called [employer] they have sent new sensors and she is still having same problem. She checked blood sugar while on the phone fingerstick was 141 and libre read 53. Please advise.

## 2022-03-11 ENCOUNTER — Other Ambulatory Visit: Payer: Self-pay | Admitting: Endocrinology

## 2022-03-11 DIAGNOSIS — E1165 Type 2 diabetes mellitus with hyperglycemia: Secondary | ICD-10-CM

## 2022-03-26 ENCOUNTER — Telehealth: Payer: Self-pay

## 2022-03-26 NOTE — Telephone Encounter (Signed)
Patient left voicemail states she is only taken 15 units of humulin. Wants to know if you can increase her Jardiance and mounjaro? States her blood sugar readings have been good.

## 2022-04-15 ENCOUNTER — Other Ambulatory Visit: Payer: Self-pay | Admitting: Endocrinology

## 2022-04-15 DIAGNOSIS — E1165 Type 2 diabetes mellitus with hyperglycemia: Secondary | ICD-10-CM

## 2022-04-17 ENCOUNTER — Telehealth: Payer: Self-pay | Admitting: Endocrinology

## 2022-04-17 NOTE — Telephone Encounter (Signed)
Patient called to get scheduled for follow up - was scheduled for 08/02/22. Patient is requesting an A1C be done as soon as possible.  Please call at [phone]

## 2022-04-19 NOTE — Telephone Encounter (Signed)
F/u   The patient is calling back to speak with the nurse

## 2022-04-29 ENCOUNTER — Ambulatory Visit: Payer: 59 | Admitting: Endocrinology

## 2022-04-30 NOTE — Progress Notes (Unsigned)
Virtual Visit via Video Note  Consent was obtained for video visit:  Yes.   Answered questions that patient had about telehealth interaction:  Yes.   I discussed the limitations, risks, security and privacy concerns of performing an evaluation and management service by telemedicine. I also discussed with the patient that there may be a patient responsible charge related to this service. The patient expressed understanding and agreed to proceed.  Pt location: Home Physician Location: office Name of referring provider:  Simona Huh, NP I connected with Kendra Hall at patients initiation/request on 05/01/2022 at  3:30 PM EST by video enabled telemedicine application and verified that I am speaking with the correct person using two identifiers. Pt MRN:  [id number] Pt DOB:  Jan 22, 1966 Video Participants:  Kendra Hall;  Assessment/Plan:   1.  Chronic low back pain - failed medication and physical therapy 2.  Episode of left sided weakness and slurred speech.  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   3.  Left sided cervical radiculopathy - May be attributed to findings on cervical spine MRI.  Not an ongoing problem and therefore treatment not currently indicated.   4.  Migraine without aura, without status migrainosus, not intractable - controlled   As she has failed medication and physical therapy for back pain, will check MRI of lumbar spine Migraine prevention:  Emgality every 4 weeks, zonisamide 42m daily Migraine rescue:  She has samples of Ubrelvy.  Has not needed it yet.  Due to cerebrovascular disease/history of TIA vs possible hemiplegic migraine, triptans are contraindicated Continue management of stroke  risk factors as per PCP: ASA 869mdaily Statin.  LDL goal less than 70 Normotensive blood pressure Optimize glycemic control.  Supposed to see endocrinology Follow up 6 months.    Subjective:  Kendra Hall a [age]ear old woman with type 2 diabetes mellitus, hypothyroidism, fibromyalgia and depression who follows up for stroke-like event.    UPDATE: She feels her equilibrium is off lately.  The other night, her entire left leg felt numb from the thigh down.  She was lying down on her back.  No pain or weakness.  Lasted an hour.  Didn't want to go to the ED.  No recurrence.  She has gotten her diabetes controlled.  Her A1c is now 6.2.    No migraine headaches since last visit.   Current NSAIDS:  none Current analgesics:  Hydrocodone most days for fibromyalgia but has been treating headaches with them as well Current triptans: none Current ergotamine: None Current anti-emetic: Promethazine 12.25m49murrent muscle relaxants:  baclofen Current anti-anxiolytic:  BuSpar Current sleep aide: None Current Antihypertensive medications: Would not use beta-blocker due to asthma Current Antidepressant medications:  Cymbalta 61m4mD, mirtazapine 7.25mg 8m Current Anticonvulsant medications: zonisamide 50mg 325medtime, Lyrica 100mg B81murrent anti-CGRP:  Emgality  Current Vitamins/Herbal/Supplements: None Current Antihistamines/Decongestants: None Other therapy: None   Caffeine: No Diet: Drinks Exercise: Not routine Depression: Yes; Anxiety: Yes Other pain: Neck pain, fibromyalgia Sleep hygiene: She underwent sleep study and was diagnosed with severe OSA.  She is using a CPAP.  Sleep is improved   HISTORY: Onset: She has had history of migraines since her 20s, [id number] became frequent in 2018. Location:  Varies: back of head, across forehead, temples Quality:  Varies: squeezing, sharp Initial Intensity:  10/10 Aura:  no Prodrome:  no Postdrome:  no Associated symptoms: Nausea,  photophobia, phonophobia.  She denies associated vomiting, visual disturbance, or unilateral numbness or weakness.  It is not a new thunderclap headache. Initial Duration:  All day (usually lets up in 30 minutes after taking Maxalt, however lately headache returns in 2 hours) Initial Frequency:  Every other day Initial Frequency of abortive medication: every other day Triggers:  None Relieving factors: Sleep Activity:  aggravates   History of recurrent stroke-like episodes with negative imaging.  In 2016, she had an episode of slurred speech with left sided weakness.  In 2019, she had an episode of left sided facial numbness.  In March 2022, she had twitching on the left side of her mouth lasting a second and intermittent slurred speech.  No unilateral numbness, weakness or headache.  CT head unremarkable.  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  Following this event, she had another episode of speech disturbance, trouble saying the correct word.  Admitted to hospital on 11/15/2021 for another episode of slurred speech and nausea.  Also endorsed neck pain and numbness radiating down left arm.  Left leg seemed slightly weak on exam.  Symptoms lasted a couple of hours.  MRI of brain revealed stable chronic small vessel ischemic changes but no acute stroke.  CTA of head and neck showed moderate focal narrowing of right V2 vertebral artery due to mass effect from degenerative bony spurring but no significant intracranial or extracranial stenosis or occlusion.  2D echocardiogram showed EF 70-75%.  LDL was 28.  She had a recent Hgb A1c in April that was 11.  TIA suspected.  In addition to ASA, she was placed on Plavix for 21 days followed again by ASA monotherapy.  Due to neck pain and reported left sided pain and weakness, she had an MRI of the cervical spine which revealed moderate  spinal stenosis at C5-6 by no severe stenosis or cord abnormality.    Past NSAIDS/steroids:  ibuprofen, prednisone (unable to take0 Past analgesics:  Tylenol, Excedrin Migraine Past abortive triptans:  Sumatriptan tablet, rizatriptan Past muscle relaxants:  no Past anti-emetic:  Zofran Past antihypertensive medications:  no Past antidepressant medications:  sertraline 23m, nortriptyline 78mas needed for sleep (cannot take nightly due to extreme dry mouth), Lexapro 2087mast anticonvulsant medications:  topiramate immediate release 100m73mognitive problems), Topiramate ER 100 mg (effective) Past vitamins/Herbal/Supplements:  no Other past therapies:  no   Family history of headache:  cousins   MRI and MRA of head from 03/15/15 were personally reviewed and were unremarkable except for minimal punctate white matter foci.   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  CT cervical spine from 01/09/18 showed cervical spondylosis with degenerative changes greatest at C4-C7 levels with bilateral neural foraminal stenosis, as well as mild multifactorial C5-6 canal stenosis.  She is treated by pain management.  Endorses numbness and tingling in hands.  NCV-EMG of upper extremities in June 2022 revealed mild carpal tunnel syndrome.    Past Medical History: Past Medical History:  Diagnosis Date   Allergy    Anemia    history of   Anxiety    Asthma    Depression    Diabetes mellitus    type  2    Fibromyalgia    Gastric ulcer    GERD (gastroesophageal reflux disease)    Graves disease    H/O therapeutic radiation    Migraine    Multiple thyroid nodules    Myofacial muscle pain    Osteoarthritis    Facet hypertrophy with injections   TIA (transient ischemic attack)    02/2015    Medications: Outpatient Encounter Medications as of 05/01/2022  Medication Sig   acetaminophen (TYLENOL) 500 MG tablet Take 1,000 mg by mouth every 6 (six) hours as  needed for moderate pain.   albuterol (PROAIR HFA) 108 (90 Base) MCG/ACT inhaler Inhale 2 puffs into the lungs every 4 (four) hours as needed for wheezing.   albuterol (PROVENTIL) (2.5 MG/3ML) 0.083% nebulizer solution Take 3 mLs by nebulization 4 (four) times daily as needed for wheezing or shortness of breath.    amLODipine (NORVASC) 10 MG tablet Take 10 mg by mouth daily.   aspirin EC 81 MG tablet Take 1 tablet (81 mg total) by mouth daily. Swallow whole.   atorvastatin (LIPITOR) 20 MG tablet Take 20 mg by mouth every evening.   benzonatate (TESSALON) 200 MG capsule Take 200 mg by mouth 3 (three) times daily as needed for cough.   Blood Glucose Monitoring Suppl (ONETOUCH VERIO) w/Device KIT Check blood sugar once daily   clopidogrel (PLAVIX) 75 MG tablet Take 1 tablet (75 mg total) by mouth daily.   DULoxetine (CYMBALTA) 60 MG capsule Take 60 mg by mouth 2 (two) times daily.   EMGALITY 120 MG/ML SOAJ ADMINISTER 1 ML UNDER THE SKIN EVERY 30 DAYS   famciclovir (FAMVIR) 500 MG tablet Take 500 mg by mouth 2 (two) times daily.   famotidine (PEPCID) 20 MG tablet Take 1 tablet (20 mg total) by mouth every evening.   glucose blood (ONETOUCH VERIO) test strip CHECK YOUR BLOOD SUGAR ONCE DAILY(VARY THE TIME OF DAY WHEN YOU CHECK BETWEEN, BEFORE THE 3 MEALS, AND AT BEDTIME)   HYDROcodone bit-homatropine (HYDROMET) 5-1.5 MG/5ML syrup Hydromet 5 mg-1.5 mg/5 mL oral syrup  TAKE 5 ML BY MOUTH FOUR TIMES DAILY AS NEEDED   hydrOXYzine (VISTARIL) 25 MG capsule Take 25 mg by mouth 3 (three) times daily as needed for itching.   insulin glargine, 2 Unit Dial, (TOUJEO MAX SOLOSTAR) 300 UNIT/ML Solostar Pen Inject 110 Units into the skin every morning. (Patient taking differently: Inject 50 Units into the skin every morning.)   Insulin Pen Needle (BD PEN NEEDLE NANO 2ND GEN) 32G X 4 MM MISC USE TO INJECT LANTUS UNDER THE SKIN EVERY MORNING   insulin regular human CONCENTRATED (HUMULIN R U-500 KWIKPEN) 500 UNIT/ML  KwikPen ADMINISTER 50 UNITS UNDER THE SKIN 30 MINUTES BEFORE MEALS   JARDIANCE 10 MG TABS tablet TAKE 1 TABLET(10 MG) BY MOUTH DAILY WITH BREAKFAST   lamoTRIgine (LAMICTAL) 150 MG tablet Take 200 mg by mouth at bedtime.   Lancets (ONETOUCH ULTRASOFT) lancets Check blood sugar once daily.   levothyroxine (SYNTHROID) 137 MCG tablet Take 1 tablet (137 mcg total) by mouth daily before breakfast.   linaclotide (LINZESS) 290 MCG CAPS capsule Take 290 mcg by mouth daily before breakfast.    Magnesium 250 MG TABS Take 750 mg by mouth at bedtime.   mirtazapine (REMERON) 7.5 MG tablet Take 7.5 mg by mouth at bedtime.   montelukast (SINGULAIR) 10 MG tablet Take 10 mg by mouth daily.    nicotine (NICODERM CQ - DOSED IN MG/24 HOURS) 14 mg/24hr patch Place 1  patch (14 mg total) onto the skin daily.   promethazine (PHENERGAN) 12.5 MG tablet Take 12.5 mg by mouth every 6 (six) hours as needed for nausea or vomiting.   tirzepatide (MOUNJARO) 5 MG/0.5ML Pen Inject 5 mg into the skin once a week.   zonisamide (ZONEGRAN) 50 MG capsule Take 1 capsule (50 mg total) by mouth daily.   No facility-administered encounter medications on file as of 05/01/2022.    Allergies: Allergies  Allergen Reactions   Gabapentin Other (See Comments)    unknown   Ergotrate [Ergonovine] Other (See Comments)    Pt does not ever want   Ibuprofen Other (See Comments)    Patient has ulcer   Latex Other (See Comments)    Breaks out   Metformin And Related Other (See Comments)    Dizziness   Yutopar [Ritodrine] Other (See Comments)    Pt does not ever want    Family History: Family History  Problem Relation Age of Onset   Hypertension Mother    Diabetes Mother    Cancer Mother        rectal   Stroke Father    Heart disease Father 24       MI x5   Hypertension Father    Diabetes Father    Breast cancer Sister        unsure of age , was between 47 and 71   Drug abuse Brother    Alcohol abuse Brother    Drug abuse  Brother    Alcohol abuse Brother    Anxiety disorder Neg Hx    Bipolar disorder Neg Hx    Depression Neg Hx     Observations/Objective:   No acute distress.  Alert and oriented.  Speech fluent and not dysarthric.  Language intact.     Follow Up Instructions:    -I discussed the assessment and treatment plan with the patient. The patient was provided an opportunity to ask questions and all were answered. The patient agreed with the plan and demonstrated an understanding of the instructions.   The patient was advised to call back or seek an in-person evaluation if the symptoms worsen or if the condition fails to improve as anticipated.   Dudley Major, DO    CC: Kendra Huh, NP

## 2022-05-01 ENCOUNTER — Telehealth (INDEPENDENT_AMBULATORY_CARE_PROVIDER_SITE_OTHER): Payer: 59 | Admitting: Neurology

## 2022-05-01 ENCOUNTER — Encounter: Payer: Self-pay | Admitting: Neurology

## 2022-05-01 DIAGNOSIS — G43009 Migraine without aura, not intractable, without status migrainosus: Secondary | ICD-10-CM | POA: Diagnosis not present

## 2022-05-01 DIAGNOSIS — G8929 Other chronic pain: Secondary | ICD-10-CM

## 2022-05-01 DIAGNOSIS — G43409 Hemiplegic migraine, not intractable, without status migrainosus: Secondary | ICD-10-CM

## 2022-05-01 DIAGNOSIS — M5412 Radiculopathy, cervical region: Secondary | ICD-10-CM | POA: Diagnosis not present

## 2022-05-01 DIAGNOSIS — M545 Low back pain, unspecified: Secondary | ICD-10-CM | POA: Diagnosis not present

## 2022-05-01 MED ORDER — PROMETHAZINE HCL 12.5 MG PO TABS
12.5000 mg | ORAL_TABLET | Freq: Four times a day (QID) | ORAL | 11 refills | Status: DC | PRN
Start: 2022-05-01 — End: 2022-06-12

## 2022-05-01 MED ORDER — ZONISAMIDE 50 MG PO CAPS
50.0000 mg | ORAL_CAPSULE | Freq: Every day | ORAL | 11 refills | Status: DC
Start: 2022-05-01 — End: 2023-03-04

## 2022-05-01 MED ORDER — EMGALITY 120 MG/ML ~~LOC~~ SOAJ
SUBCUTANEOUS | 11 refills | Status: AC
Start: 2022-05-01 — End: ?

## 2022-05-01 NOTE — Progress Notes (Unsigned)
[employer] handicap forms filled out and mailed to patient per requested.

## 2022-05-02 NOTE — Addendum Note (Signed)
Addended by: Venetia Night on: 05/02/2022 10:08 AM   Modules accepted: Orders

## 2022-05-04 ENCOUNTER — Other Ambulatory Visit: Payer: Self-pay | Admitting: Neurology

## 2022-05-08 ENCOUNTER — Other Ambulatory Visit: Payer: Self-pay | Admitting: Endocrinology

## 2022-05-08 DIAGNOSIS — E1165 Type 2 diabetes mellitus with hyperglycemia: Secondary | ICD-10-CM

## 2022-05-15 ENCOUNTER — Ambulatory Visit
Admission: RE | Admit: 2022-05-15 | Discharge: 2022-05-15 | Disposition: A | Discharge status: Home / Self Care | Payer: 59 | Source: Ambulatory Visit | Attending: Neurology | Admitting: Neurology

## 2022-05-15 DIAGNOSIS — G8929 Other chronic pain: Secondary | ICD-10-CM

## 2022-05-18 ENCOUNTER — Other Ambulatory Visit: Payer: Self-pay | Admitting: Neurology

## 2022-05-22 ENCOUNTER — Telehealth: Payer: Self-pay

## 2022-05-22 DIAGNOSIS — G8929 Other chronic pain: Secondary | ICD-10-CM

## 2022-05-22 NOTE — Telephone Encounter (Signed)
-----   Message from Pieter Partridge, DO sent at 05/20/2022  4:37 PM EST ----- The MRI shows that a nerve in the back may be a little "pinched" and possible the cause of the leg numbness.  Given her back pain and leg numbness, we can refer her to an interventional pain specialist to see if she would benefit from an epidural injection.

## 2022-05-22 NOTE — Telephone Encounter (Signed)
Patient advised of results. Referral.

## 2022-05-23 ENCOUNTER — Telehealth: Payer: Self-pay | Admitting: Neurology

## 2022-05-23 NOTE — Telephone Encounter (Signed)
Patient wants to speak to someone about getting acupuncture

## 2022-05-23 NOTE — Telephone Encounter (Signed)
Spoke to patient, Is there a way she can do Acupuncture instead.  Advised patient that will not be up to [place], the referral may or may not offer it.  But I will ask Dr.Jaffe and if you find a place I will let Dr.Jaffe know.

## 2022-05-24 NOTE — Telephone Encounter (Signed)
Patient advised of Dr.Jaffe note, I have no objection to acupuncture.  If a referral is needed, we can provide one.  Patient will call back or send a myhcart were she will like that sent.

## 2022-05-27 ENCOUNTER — Other Ambulatory Visit (INDEPENDENT_AMBULATORY_CARE_PROVIDER_SITE_OTHER): Payer: 59

## 2022-05-27 DIAGNOSIS — E1165 Type 2 diabetes mellitus with hyperglycemia: Secondary | ICD-10-CM

## 2022-05-27 DIAGNOSIS — Z794 Long term (current) use of insulin: Secondary | ICD-10-CM

## 2022-05-27 LAB — BASIC METABOLIC PANEL
BUN: 15 mg/dL (ref 6–23)
CO2: 26 mEq/L (ref 19–32)
Calcium: 9.6 mg/dL (ref 8.4–10.5)
Chloride: 100 mEq/L (ref 96–112)
Creatinine, Ser: 1.21 mg/dL — ABNORMAL HIGH (ref 0.40–1.20)
GFR: 50.08 mL/min — ABNORMAL LOW (ref >60.00)
Glucose, Bld: 157 mg/dL — ABNORMAL HIGH (ref 70–99)
Potassium: 3.7 mEq/L (ref 3.5–5.1)
Sodium: 137 mEq/L (ref 135–145)

## 2022-05-27 LAB — HEMOGLOBIN A1C: Hgb A1c MFr Bld: 7.2 % — ABNORMAL HIGH (ref 4.6–6.5)

## 2022-05-29 ENCOUNTER — Other Ambulatory Visit: Payer: Self-pay | Admitting: Endocrinology

## 2022-05-29 DIAGNOSIS — Z794 Long term (current) use of insulin: Secondary | ICD-10-CM

## 2022-05-29 DIAGNOSIS — E119 Type 2 diabetes mellitus without complications: Secondary | ICD-10-CM

## 2022-05-31 ENCOUNTER — Ambulatory Visit (INDEPENDENT_AMBULATORY_CARE_PROVIDER_SITE_OTHER): Payer: 59 | Admitting: Endocrinology

## 2022-05-31 ENCOUNTER — Encounter: Payer: Self-pay | Admitting: Endocrinology

## 2022-05-31 VITALS — BP 130/72 | HR 92 | Ht 63.0 in | Wt 213.4 lb

## 2022-05-31 DIAGNOSIS — I10 Essential (primary) hypertension: Secondary | ICD-10-CM | POA: Diagnosis not present

## 2022-05-31 DIAGNOSIS — E1165 Type 2 diabetes mellitus with hyperglycemia: Secondary | ICD-10-CM | POA: Diagnosis not present

## 2022-05-31 DIAGNOSIS — Z794 Long term (current) use of insulin: Secondary | ICD-10-CM

## 2022-05-31 DIAGNOSIS — E039 Hypothyroidism, unspecified: Secondary | ICD-10-CM

## 2022-05-31 MED ORDER — TIRZEPATIDE 7.5 MG/0.5ML ~~LOC~~ SOAJ: 7.5000 mg | SUBCUTANEOUS | 1 refills | Status: DC

## 2022-05-31 MED ORDER — INSULIN LISPRO (1 UNIT DIAL) 100 UNIT/ML (KWIKPEN): PEN_INJECTOR | SUBCUTANEOUS | 1 refills | Status: DC

## 2022-05-31 NOTE — Progress Notes (Signed)
Patient ID: Kendra Hall, female   DOB: [birth year], 56 y.o.   MRN: [id number]           Reason for Appointment: Endocrinology follow-up   History of Present Illness   Diagnosis date: 2012  Previous history:  hypoglycemic drugs previously used : Trulicity, Invokana, glimepiride, Prandin and Victoza Insulin was started in 2020  Her blood sugars have been progressively higher since 2019 A1c range in the last few years is: 6-12.7  Recent history:     Non-insulin hypoglycemic drugs: Jardiance 10 mg daily, Mounjaro 5 mg weekly     Insulin regimen: Toujeo 36 units in the morning daily, Humulin R U-500 insulin, 20 +-units before/after meals       Side effects from medications: Vomiting and constipation from Trulicity  Current self management, blood sugar patterns and problems identified:  A1c has improved to 7.2 She started using the Dexcom G6 which was approved finally However she had some difficulties keeping her sensor on and data is only available through the fourth December  With starting [facility] her blood sugars are better and she is tolerating this better than Trulicity Currently taking 5 mg regularly She says she is trying to improve her diet on her own and has lost 9 pounds recently She is also trying to do some walking She has difficulty remembering to take her Humulin R before meals and frequently will take it right before eating However overall taking less basal insulin, in June she was taking 110 units Toujeo, overnight blood sugars are variable but generally controlled She is continues to take Jardiance 10 mg,      Interpretation of her download between 11/21 and 12/4 shows the following  Compared to her last visit her time in range is much better at 81 compared to 61% Data was not available between 11/23 and 11/27 HYPERGLYCEMIA is present only sporadically after different meals and rarely overnight  Overnight blood sugars are highly variable with low normal  readings a couple of times and periodically going above target range, not clear if hypoglycemia data was accurate on 12/1  On an average blood sugars are within the target range at all times with HIGHEST readings after lunch  POSTPRANDIAL readings are on an average not rising excessively with sporadic spikes at different times especially after lunch when the AVERAGE blood sugar is 178 Hypoglycemia likely not present from day-to-day evaluation  CGM use % of time 50  2-week average/GV 146+/-39  Time in range      81%  % Time Above 180 17  % Time above 250   % Time Below 70 1     PRE-MEAL Fasting Lunch Dinner Bedtime Overall  Glucose range:       Averages: 150    146   POST-MEAL PC Breakfast PC Lunch PC Dinner  Glucose range:     Averages:      Previously:  CGM use % of time 98  2-week average/GV 171  Time in range        631%  % Time Above 180 25  % Time above 250 12  % Time Below 70 2     PRE-MEAL Fasting Lunch Dinner Bedtime Overall  Glucose range:       Averages: 163 168      POST-MEAL PC Breakfast PC Lunch PC Dinner  Glucose range:     Averages: 184 229 177     EXERCISE: She is some walking 3/7, 20 minutes at least  Dietician visit: Most recent: 7/23  Weight control: Previously maximum was 218  Wt Readings from Last 3 Encounters:  05/31/22 213 lb 6.4 oz (96.8 kg)  02/05/22 222 lb 6.4 oz (100.9 kg)  01/09/22 228 lb (103.4 kg)         Diabetes labs:  Lab Results  Component Value Date   HGBA1C 7.2 (H) 05/27/2022   HGBA1C 8.2 (A) 02/05/2022   HGBA1C 11.4 (A) 12/11/2021   Lab Results  Component Value Date   LDLCALC 28 11/16/2021   CREATININE 1.21 (H) 05/27/2022     Allergies as of 05/31/2022       Reactions   Gabapentin Other (See Comments)   unknown   Ergotrate [ergonovine] Other (See Comments)   Pt does not ever want   Ibuprofen Other (See Comments)   Patient has ulcer   Latex Other (See Comments)   Breaks out   Metformin And  Related Other (See Comments)   Dizziness   Yutopar [ritodrine] Other (See Comments)   Pt does not ever want        Medication List        Accurate as of May 31, 2022  9:31 AM. If you have any questions, ask your nurse or doctor.          STOP taking these medications    HumuLIN R U-500 KwikPen 500 UNIT/ML KwikPen Generic drug: insulin regular human CONCENTRATED Stopped by: Elayne Snare, MD   tirzepatide 5 MG/0.5ML Pen Commonly known as: MOUNJARO Replaced by: tirzepatide 7.5 MG/0.5ML Pen Stopped by: Elayne Snare, MD       TAKE these medications    acetaminophen 500 MG tablet Commonly known as: TYLENOL Take 1,000 mg by mouth every 6 (six) hours as needed for moderate pain.   albuterol 108 (90 Base) MCG/ACT inhaler Commonly known as: ProAir HFA Inhale 2 puffs into the lungs every 4 (four) hours as needed for wheezing.   albuterol (2.5 MG/3ML) 0.083% nebulizer solution Commonly known as: PROVENTIL Take 3 mLs by nebulization 4 (four) times daily as needed for wheezing or shortness of breath.   amLODipine 10 MG tablet Commonly known as: NORVASC Take 10 mg by mouth daily.   aspirin EC 81 MG tablet Take 1 tablet (81 mg total) by mouth daily. Swallow whole.   atorvastatin 20 MG tablet Commonly known as: LIPITOR Take 20 mg by mouth every evening.   BD Pen Needle Nano 2nd Gen 32G X 4 MM Misc Generic drug: Insulin Pen Needle USE TO INJECT LANTUS UNDER THE SKIN EVERY MORNING   benzonatate 200 MG capsule Commonly known as: TESSALON Take 200 mg by mouth 3 (three) times daily as needed for cough.   clopidogrel 75 MG tablet Commonly known as: PLAVIX Take 1 tablet (75 mg total) by mouth daily.   DULoxetine 60 MG capsule Commonly known as: CYMBALTA Take 60 mg by mouth 2 (two) times daily.   Emgality 120 MG/ML Soaj Generic drug: Galcanezumab-gnlm ADMINISTER 1 ML UNDER THE SKIN EVERY 30 DAYS   famciclovir 500 MG tablet Commonly known as: FAMVIR Take 500 mg by  mouth 2 (two) times daily.   famotidine 20 MG tablet Commonly known as: PEPCID Take 1 tablet (20 mg total) by mouth every evening.   Hydromet 5-1.5 MG/5ML syrup Generic drug: HYDROcodone bit-homatropine Hydromet 5 mg-1.5 mg/5 mL oral syrup  TAKE 5 ML BY MOUTH FOUR TIMES DAILY AS NEEDED   hydrOXYzine 25 MG capsule Commonly known as: VISTARIL Take 25 mg by mouth  3 (three) times daily as needed for itching.   insulin lispro 100 UNIT/ML KwikPen Commonly known as: HumaLOG KwikPen 15-20 unit s ac tid Started by: Elayne Snare, MD   Jardiance 10 MG Tabs tablet Generic drug: empagliflozin TAKE 1 TABLET(10 MG) BY MOUTH DAILY WITH BREAKFAST   lamoTRIgine 150 MG tablet Commonly known as: LAMICTAL Take 200 mg by mouth at bedtime.   levothyroxine 137 MCG tablet Commonly known as: SYNTHROID Take 1 tablet (137 mcg total) by mouth daily before breakfast.   linaclotide 290 MCG Caps capsule Commonly known as: LINZESS Take 290 mcg by mouth daily before breakfast.   Magnesium 250 MG Tabs Take 750 mg by mouth at bedtime.   mirtazapine 7.5 MG tablet Commonly known as: REMERON Take 7.5 mg by mouth at bedtime.   montelukast 10 MG tablet Commonly known as: SINGULAIR Take 10 mg by mouth daily.   nicotine 14 mg/24hr patch Commonly known as: NICODERM CQ - dosed in mg/24 hours Place 1 patch (14 mg total) onto the skin daily.   OneTouch Delica Plus [id number] Misc CHECK BLOOD SUGAR ONCE DAILY   OneTouch Verio test strip Generic drug: glucose blood CHECK YOUR BLOOD SUGAR ONCE DAILY(VARY THE TIME OF DAY WHEN YOU CHECK BETWEEN, BEFORE THE 3 MEALS, AND AT BEDTIME)   OneTouch Verio w/Device Kit Check blood sugar once daily   promethazine 12.5 MG tablet Commonly known as: PHENERGAN Take 1 tablet (12.5 mg total) by mouth every 6 (six) hours as needed for nausea or vomiting.   tirzepatide 7.5 MG/0.5ML Pen Commonly known as: MOUNJARO Inject 7.5 mg into the skin once a week. Replaces:  tirzepatide 5 MG/0.5ML Pen Started by: Elayne Snare, MD   Toujeo Max SoloStar 300 UNIT/ML Solostar Pen Generic drug: insulin glargine (2 Unit Dial) Inject 110 Units into the skin every morning. What changed: how much to take   zonisamide 50 MG capsule Commonly known as: ZONEGRAN Take 1 capsule (50 mg total) by mouth daily.        Allergies:  Allergies  Allergen Reactions   Gabapentin Other (See Comments)    unknown   Ergotrate [Ergonovine] Other (See Comments)    Pt does not ever want   Ibuprofen Other (See Comments)    Patient has ulcer   Latex Other (See Comments)    Breaks out   Metformin And Related Other (See Comments)    Dizziness   Yutopar [Ritodrine] Other (See Comments)    Pt does not ever want    Past Medical History:  Diagnosis Date   Allergy    Anemia    history of   Anxiety    Asthma    Depression    Diabetes mellitus    type 2    Fibromyalgia    Gastric ulcer    GERD (gastroesophageal reflux disease)    Graves disease    H/O therapeutic radiation    Migraine    Multiple thyroid nodules    Myofacial muscle pain    Osteoarthritis    Facet hypertrophy with injections   TIA (transient ischemic attack)    02/2015    Past Surgical History:  Procedure Laterality Date   AXILLARY LYMPH NODE DISSECTION  2001   CHOLECYSTECTOMY N/A 09/03/2018   Procedure: LAPAROSCOPIC CHOLECYSTECTOMY;  Surgeon: Coralie Keens, MD;  Location: [facility];  Service: General;  Laterality: N/A;   [place] Left 03/11/2017   Procedure: HYSTERECTOMY VAGINAL W/LEFT  PROXIMAL SALPINGECTOMY;  Surgeon: Princess Bruins, MD;  Location: [facility];  Service: Gynecology;  Laterality: Left;  request 7:30am OR time  request 2 hours      Family History  Problem Relation Age of Onset   Hypertension Mother    Diabetes Mother    Cancer Mother        rectal   Stroke Father    Heart disease Father 34       MI x5    Hypertension Father    Diabetes Father    Breast cancer Sister        unsure of age , was between 64 and 16   Drug abuse Brother    Alcohol abuse Brother    Drug abuse Brother    Alcohol abuse Brother    Anxiety disorder Neg Hx    Bipolar disorder Neg Hx    Depression Neg Hx     Social History:  reports that she has been smoking cigarettes. She has a 20.50 pack-year smoking history. She has never used smokeless tobacco. She reports that she does not currently use drugs. She reports that she does not drink alcohol.  Review of Systems:  Last diabetic eye exam date 8/23  Last foot exam date: 10/22  No recent urine microalbumin available  Hypertension:   Treatment includes Benicar HCTZ 20/12.5 from her PCP and she thinks her blood pressure was only 161 systolic with her PCP this week Previously on amlodipine Keeping home BP check periodically   BP Readings from Last 3 Encounters:  05/31/22 130/72  02/05/22 130/78  12/27/21 134/70    Lipids: Now treated with atorvastatin 20 mg daily    Lab Results  Component Value Date   CHOL 102 11/16/2021   CHOL 122 (L) 02/02/2016   CHOL 118 03/16/2015   Lab Results  Component Value Date   HDL 34 (L) 11/16/2021   HDL 41 (L) 02/02/2016   HDL 37 (L) 03/16/2015   Lab Results  Component Value Date   LDLCALC 28 11/16/2021   LDLCALC 55 02/02/2016   LDLCALC 58 03/16/2015   Lab Results  Component Value Date   TRIG 199 (H) 11/16/2021   TRIG 131 02/02/2016   TRIG 113 03/16/2015   Lab Results  Component Value Date   CHOLHDL 3.0 11/16/2021   CHOLHDL 3.0 02/02/2016   CHOLHDL 3.2 03/16/2015   Lab Results  Component Value Date   LDLDIRECT 60 11/08/2011   LDLDIRECT 57 11/16/2010   HYPOTHYROIDISM: She has had hypothyroidism since she had I-131 for Graves' disease in 2012  In 2022 she was taking 224 mcg levothyroxine and because of persistently low TSH her levothyroxine was reduced to 112 mcg during her hospital stay in 5/23 She  is now taking 137 mcg   Lab Results  Component Value Date   TSH 1.43 02/05/2022   TSH 11.91 (H) 12/11/2021   TSH 0.014 (L) 11/16/2021   FREET4 1.14 02/05/2022   FREET4 0.76 12/11/2021   FREET4 1.61 (H) 11/16/2021   Lab Results  Component Value Date   TSH 1.43 02/05/2022   TSH 11.91 (H) 12/11/2021   TSH 0.014 (L) 11/16/2021   TSH <0.010 (L) 11/15/2021   TSH 0.02 (L) 10/18/2021   TSH 0.13 (L) 08/30/2021   TSH 8.20 (H) 03/26/2021    She takes Cymbalta and Remeron for depression   Examination:   BP 130/72   Pulse 92   Ht _0  (1.6 m)   Wt 213 lb 6.4 oz (  96.8 kg)   LMP 12/06/2016 (Approximate)   SpO2 94%   BMI 37.80 kg/m   Body mass index is 37.8 kg/m.    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:   See history of present illness for detailed discussion of current diabetes management, blood sugar patterns and problems identified  Current regimen: Humulin R 50 units 3 times daily and Toujeo 50 units daily; Jardiance 10 mg daily  A1c is 7.2, previously over 11%   Blood glucose control is overall better with adding Mounjaro Also able to lose some weight Details are discussed above including Dexcom download  Recommendations:  Continue working on diet and exercise Encouraged Mounjaro to 7.5 mg weekly for the next 3 months Since renal function is borderline will not increase Jardiance Continue 36 units of Toujeo unless overnight blood sugars are out of range Blood sugar targets before and after meals discussed At her preference have changed her Humalog to HUMALOG She will start with 15 to 20 units based on meal size and take it before eating She can adjust it further if blood sugars after meals are staying over 180 or going below 130 She will check with her insurance about covering the Dexcom G7 Recheck A1c in 3 months  Renal function is slightly worse and may be from lower blood pressure using Benicar HCTZ Discussed that she can  reduce her Benicar HCT to half tablet for now  Will monitor blood pressure at home Will need to be checked again on the next visit  HYPOTHYROIDISM: She is taking her 137 mcg levothyroxine  Thyroid levels will be checked on the next visit   Patient Instructions  Humalog replaces Humulin R, try to take 5 to 10 minutes before eating  Take enough to keep sugars <180  Olmesartan 1/2 daily Keep blood pressure monitored   Elayne Snare 05/31/2022, 9:31 AM

## 2022-05-31 NOTE — Patient Instructions (Addendum)
Humalog replaces Humulin R, try to take 5 to 10 minutes before eating  Take enough to keep sugars <180  Olmesartan 1/2 daily Keep blood pressure monitored

## 2022-06-03 ENCOUNTER — Other Ambulatory Visit: Payer: Self-pay

## 2022-06-03 DIAGNOSIS — E1165 Type 2 diabetes mellitus with hyperglycemia: Secondary | ICD-10-CM

## 2022-06-03 MED ORDER — EMPAGLIFLOZIN 10 MG PO TABS: ORAL_TABLET | ORAL | 3 refills | Status: DC

## 2022-06-04 ENCOUNTER — Encounter: Payer: Self-pay | Admitting: Endocrinology

## 2022-06-08 ENCOUNTER — Other Ambulatory Visit: Payer: Self-pay | Admitting: Neurology

## 2022-06-11 ENCOUNTER — Other Ambulatory Visit (HOSPITAL_COMMUNITY): Payer: Self-pay

## 2022-06-12 ENCOUNTER — Other Ambulatory Visit: Payer: Self-pay | Admitting: Neurology

## 2022-06-14 ENCOUNTER — Other Ambulatory Visit: Payer: Self-pay

## 2022-06-14 ENCOUNTER — Ambulatory Visit: Payer: 59 | Admitting: Endocrinology

## 2022-06-14 DIAGNOSIS — E1165 Type 2 diabetes mellitus with hyperglycemia: Secondary | ICD-10-CM

## 2022-06-14 MED ORDER — TOUJEO MAX SOLOSTAR 300 UNIT/ML ~~LOC~~ SOPN: PEN_INJECTOR | SUBCUTANEOUS | 3 refills | Status: DC

## 2022-06-14 MED ORDER — INSULIN LISPRO (1 UNIT DIAL) 100 UNIT/ML (KWIKPEN): PEN_INJECTOR | SUBCUTANEOUS | 2 refills | Status: DC

## 2022-06-20 ENCOUNTER — Other Ambulatory Visit: Payer: 59

## 2022-06-25 ENCOUNTER — Ambulatory Visit: Payer: 59 | Admitting: Endocrinology

## 2022-06-26 ENCOUNTER — Other Ambulatory Visit (HOSPITAL_COMMUNITY): Payer: Self-pay

## 2022-06-26 ENCOUNTER — Other Ambulatory Visit: Payer: Self-pay | Admitting: Neurology

## 2022-07-03 ENCOUNTER — Other Ambulatory Visit: Payer: Self-pay

## 2022-07-03 DIAGNOSIS — Z794 Long term (current) use of insulin: Secondary | ICD-10-CM

## 2022-07-03 MED ORDER — BD PEN NEEDLE NANO 2ND GEN 32G X 4 MM MISC: 3 refills | Status: DC

## 2022-07-11 ENCOUNTER — Other Ambulatory Visit: Payer: Self-pay | Admitting: Endocrinology

## 2022-07-11 DIAGNOSIS — E1165 Type 2 diabetes mellitus with hyperglycemia: Secondary | ICD-10-CM

## 2022-07-11 DIAGNOSIS — E119 Type 2 diabetes mellitus without complications: Secondary | ICD-10-CM

## 2022-07-12 ENCOUNTER — Other Ambulatory Visit: Payer: Self-pay

## 2022-07-12 DIAGNOSIS — E119 Type 2 diabetes mellitus without complications: Secondary | ICD-10-CM

## 2022-07-12 MED ORDER — BD PEN NEEDLE NANO 2ND GEN 32G X 4 MM MISC: 3 refills | Status: DC

## 2022-07-18 ENCOUNTER — Other Ambulatory Visit (HOSPITAL_COMMUNITY): Payer: Self-pay

## 2022-07-26 ENCOUNTER — Other Ambulatory Visit: Payer: 59

## 2022-08-02 ENCOUNTER — Ambulatory Visit: Payer: 59 | Admitting: Endocrinology

## 2022-08-23 ENCOUNTER — Telehealth: Payer: Self-pay

## 2022-08-23 ENCOUNTER — Other Ambulatory Visit (HOSPITAL_COMMUNITY): Payer: Self-pay

## 2022-08-23 NOTE — Telephone Encounter (Signed)
PA request received via CMM from [facility] for BD Pen Needle Nano 2nd Gen 32G X 4 MM  PA has been submitted to [facility] and is pending determination.  Key: [id number]

## 2022-08-26 NOTE — Telephone Encounter (Signed)
Fax received from [facility] that PA is not needed at this time for BD Pen Needles and patient may fill at pharmacy. Letter has been attached in patients documents.

## 2022-08-30 ENCOUNTER — Other Ambulatory Visit (INDEPENDENT_AMBULATORY_CARE_PROVIDER_SITE_OTHER): Payer: 59

## 2022-08-30 DIAGNOSIS — E1165 Type 2 diabetes mellitus with hyperglycemia: Secondary | ICD-10-CM

## 2022-08-30 DIAGNOSIS — Z794 Long term (current) use of insulin: Secondary | ICD-10-CM

## 2022-08-30 DIAGNOSIS — E039 Hypothyroidism, unspecified: Secondary | ICD-10-CM

## 2022-08-30 NOTE — Addendum Note (Signed)
Addended by: Nils Flack I on: 08/30/2022 02:31 PM   Modules accepted: Orders

## 2022-08-31 LAB — HEMOGLOBIN A1C
Est. average glucose Bld gHb Est-mCnc: 140 mg/dL
Hgb A1c MFr Bld: 6.5 % — ABNORMAL HIGH (ref 4.8–5.6)

## 2022-08-31 LAB — BASIC METABOLIC PANEL
BUN/Creatinine Ratio: 13 (ref 9–23)
BUN: 15 mg/dL (ref 6–24)
CO2: 23 mmol/L (ref 20–29)
Calcium: 9.6 mg/dL (ref 8.7–10.2)
Chloride: 102 mmol/L (ref 96–106)
Creatinine, Ser: 1.16 mg/dL — ABNORMAL HIGH (ref 0.57–1.00)
Glucose: 106 mg/dL — ABNORMAL HIGH (ref 70–99)
Potassium: 4.3 mmol/L (ref 3.5–5.2)
Sodium: 141 mmol/L (ref 134–144)
eGFR: 55 mL/min/{1.73_m2} — ABNORMAL LOW (ref >59)

## 2022-08-31 LAB — TSH: TSH: 3.76 u[IU]/mL (ref 0.450–4.500)

## 2022-08-31 LAB — T4, FREE: Free T4: 1.76 ng/dL (ref 0.82–1.77)

## 2022-09-04 ENCOUNTER — Ambulatory Visit (INDEPENDENT_AMBULATORY_CARE_PROVIDER_SITE_OTHER): Payer: 59 | Admitting: Endocrinology

## 2022-09-04 ENCOUNTER — Encounter: Payer: Self-pay | Admitting: Endocrinology

## 2022-09-04 VITALS — BP 150/84 | HR 86 | Ht 63.0 in | Wt 212.8 lb

## 2022-09-04 DIAGNOSIS — Z794 Long term (current) use of insulin: Secondary | ICD-10-CM | POA: Diagnosis not present

## 2022-09-04 DIAGNOSIS — I10 Essential (primary) hypertension: Secondary | ICD-10-CM | POA: Diagnosis not present

## 2022-09-04 DIAGNOSIS — E039 Hypothyroidism, unspecified: Secondary | ICD-10-CM | POA: Diagnosis not present

## 2022-09-04 DIAGNOSIS — E119 Type 2 diabetes mellitus without complications: Secondary | ICD-10-CM | POA: Diagnosis not present

## 2022-09-04 MED ORDER — TIRZEPATIDE 12.5 MG/0.5ML ~~LOC~~ SOAJ: 12.5000 mg | SUBCUTANEOUS | 1 refills | Status: DC

## 2022-09-04 NOTE — Patient Instructions (Signed)
Take olmesartan daily  If eating larger meal or high carbohydrate meal take no more than 6 units When eating out may just take the insulin right before eating  Increase exercise to at least 30 minutes of brisk walking 5 days a week  Follow-up with your primary doctor for blood pressure management

## 2022-09-04 NOTE — Progress Notes (Unsigned)
Patient ID: Kendra Hall, female   DOB: [birth year], 57 y.o.   MRN: [id number]           Reason for Appointment: Endocrinology follow-up   History of Present Illness   Diagnosis date: 2012  Previous history:  hypoglycemic drugs previously used : Trulicity, Invokana, glimepiride, Prandin and Victoza Insulin was started in 2020  Her blood sugars have been progressively higher since 2019 A1c range in the last few years is: 6-12.7  Recent history:     Non-insulin hypoglycemic drugs: Jardiance 10 mg daily, Mounjaro 7.5 mg weekly     Insulin regimen: Toujeo 36 units in the morning daily Humalog 4 to 6 units with high carbohydrate meals    Side effects from medications: Vomiting and constipation from Trulicity  Current self management, blood sugar patterns and problems identified:  A1c has improved to 6.5 She started using the 7.5 mg Mounjaro after her last visit With this her blood sugars improved significantly and she had also switched to Humalog for mealtime insulin However because of tendency to low blood sugars for the last 2 weeks she has significantly reduced her Humalog and is taking only about 4 units for most meals when she is eating any carbohydrate With some meals she has no mealtime coverage with excellent control also With this her time in range is now 94% with only sporadic high readings on after some meals especially dinner On 1 occasion she took 10 units Humalog for eating out but she thinks caused somewhat delayed hypoglycemia in the evening  Overnight sugars are about the same with average around 130-140 overnight  She is trying to do some walking for short periods of time when she can during the day but not much brisk walking and no walking on the weekends  No nausea with Mounjaro She is continues to take Jardiance 10 mg,      Interpretation of her download for the last 2 weeks shows:  Compared to her last visit her time in range is much better at 94 compared to  81  Overnight blood sugars are fairly stable and steady as discussed above with averages around 135-140 with mild variability  Postprandial hyperglycemia is present only sporadically with mild excursions around 180 after breakfast or lunch and occasionally more significant after dinner but AVERAGE blood sugars are not exceeding 132 during the day  Has only sporadic hypoglycemia in the early evening and some low sugars are artifacts    CGM use % of time 93  2-week average/GV 132/21  Time in range      94%  % Time Above 180 5  % Time above 250   % Time Below 70 1   Previously:  CGM use % of time 50  2-week average/GV 146+/-39  Time in range      81%  % Time Above 180 17  % Time above 250   % Time Below 70 1     PRE-MEAL Fasting Lunch Dinner Bedtime Overall  Glucose range:       Averages: 150    146   POST-MEAL PC Breakfast PC Lunch PC Dinner  Glucose range:     Averages:         EXERCISE: She is some walking 3/7, 20 minutes cumulative           Dietician visit: Most recent: 7/23  Weight control: Previously maximum was 218  Wt Readings from Last 3 Encounters:  09/04/22 212 lb 12.8 oz (96.5 kg)  05/31/22 213 lb 6.4 oz (96.8 kg)  02/05/22 222 lb 6.4 oz (100.9 kg)         Diabetes labs:  Lab Results  Component Value Date   HGBA1C 6.5 (H) 08/30/2022   HGBA1C 7.2 (H) 05/27/2022   HGBA1C 8.2 (A) 02/05/2022   Lab Results  Component Value Date   LDLCALC 28 11/16/2021   CREATININE 1.16 (H) 08/30/2022     Allergies as of 09/04/2022       Reactions   Gabapentin Other (See Comments)   unknown   Ergotrate [ergonovine] Other (See Comments)   Pt does not ever want   Ibuprofen Other (See Comments)   Patient has ulcer   Latex Other (See Comments)   Breaks out   Metformin And Related Other (See Comments)   Dizziness   Yutopar [ritodrine] Other (See Comments)   Pt does not ever want        Medication List        Accurate as of September 04, 2022 11:59 PM. If  you have any questions, ask your nurse or doctor.          STOP taking these medications    nicotine 14 mg/24hr patch Commonly known as: NICODERM CQ - dosed in mg/24 hours Stopped by: Elayne Snare, MD   tirzepatide 7.5 MG/0.5ML Pen Commonly known as: MOUNJARO Replaced by: tirzepatide 12.5 MG/0.5ML Pen Stopped by: Elayne Snare, MD       TAKE these medications    acetaminophen 500 MG tablet Commonly known as: TYLENOL Take 1,000 mg by mouth every 6 (six) hours as needed for moderate pain.   albuterol 108 (90 Base) MCG/ACT inhaler Commonly known as: ProAir HFA Inhale 2 puffs into the lungs every 4 (four) hours as needed for wheezing.   albuterol (2.5 MG/3ML) 0.083% nebulizer solution Commonly known as: PROVENTIL Take 3 mLs by nebulization 4 (four) times daily as needed for wheezing or shortness of breath.   amLODipine 10 MG tablet Commonly known as: NORVASC Take 10 mg by mouth daily.   aspirin EC 81 MG tablet Take 1 tablet (81 mg total) by mouth daily. Swallow whole.   atorvastatin 20 MG tablet Commonly known as: LIPITOR Take 20 mg by mouth every evening.   BD Pen Needle Nano 2nd Gen 32G X 4 MM Misc Generic drug: Insulin Pen Needle USE TO INJECT INSULIN 4 TIMES A DAY   benzonatate 200 MG capsule Commonly known as: TESSALON Take 200 mg by mouth 3 (three) times daily as needed for cough.   clopidogrel 75 MG tablet Commonly known as: PLAVIX Take 1 tablet (75 mg total) by mouth daily.   DULoxetine 60 MG capsule Commonly known as: CYMBALTA Take 60 mg by mouth 2 (two) times daily.   Emgality 120 MG/ML Soaj Generic drug: Galcanezumab-gnlm ADMINISTER 1 ML UNDER THE SKIN EVERY 30 DAYS   empagliflozin 10 MG Tabs tablet Commonly known as: Jardiance TAKE 1 TABLET(10 MG) BY MOUTH DAILY WITH BREAKFAST   famciclovir 500 MG tablet Commonly known as: FAMVIR Take 500 mg by mouth 2 (two) times daily.   famotidine 20 MG tablet Commonly known as: PEPCID Take 1 tablet (20  mg total) by mouth every evening.   Hydromet 5-1.5 MG/5ML syrup Generic drug: HYDROcodone bit-homatropine Hydromet 5 mg-1.5 mg/5 mL oral syrup  TAKE 5 ML BY MOUTH FOUR TIMES DAILY AS NEEDED   hydrOXYzine 25 MG capsule Commonly known as: VISTARIL Take 25 mg by mouth 3 (three) times daily as needed for  itching.   insulin lispro 100 UNIT/ML KwikPen Commonly known as: HumaLOG KwikPen INJECT SUBCUTANEOUSLY 15 TO 20  UNITS 3 TIMES DAILY BEFORE MEALS   lamoTRIgine 150 MG tablet Commonly known as: LAMICTAL Take 200 mg by mouth at bedtime.   levothyroxine 137 MCG tablet Commonly known as: SYNTHROID Take 1 tablet (137 mcg total) by mouth daily before breakfast.   linaclotide 290 MCG Caps capsule Commonly known as: LINZESS Take 290 mcg by mouth daily before breakfast.   Magnesium 250 MG Tabs Take 750 mg by mouth at bedtime.   mirtazapine 7.5 MG tablet Commonly known as: REMERON Take 7.5 mg by mouth at bedtime.   montelukast 10 MG tablet Commonly known as: SINGULAIR Take 10 mg by mouth daily.   OneTouch Delica Plus 0000000 Misc CHECK BLOOD SUGAR ONCE DAILY   OneTouch Verio test strip Generic drug: glucose blood CHECK YOUR BLOOD SUGAR ONCE  DAILY AS DIRECTED (VARY TIME OF  DAY WHEN CHECK, BEFORE 3 MEALS  AND AT BEDTIME)   OneTouch Verio w/Device Kit Check blood sugar once daily   promethazine 12.5 MG tablet Commonly known as: PHENERGAN TAKE 1 TABLET BY MOUTH EVERY 6  HOURS AS NEEDED FOR NAUSEA OR  VOMITING   tirzepatide 12.5 MG/0.5ML Pen Commonly known as: MOUNJARO Inject 12.5 mg into the skin once a week. Replaces: tirzepatide 7.5 MG/0.5ML Pen Started by: Elayne Snare, MD   Toujeo Max SoloStar 300 UNIT/ML Solostar Pen Generic drug: insulin glargine (2 Unit Dial) Inject 36 units into skin every morning.   zonisamide 50 MG capsule Commonly known as: ZONEGRAN Take 1 capsule (50 mg total) by mouth daily.        Allergies:  Allergies  Allergen Reactions    Gabapentin Other (See Comments)    unknown   Ergotrate [Ergonovine] Other (See Comments)    Pt does not ever want   Ibuprofen Other (See Comments)    Patient has ulcer   Latex Other (See Comments)    Breaks out   Metformin And Related Other (See Comments)    Dizziness   Yutopar [Ritodrine] Other (See Comments)    Pt does not ever want    Past Medical History:  Diagnosis Date   Allergy    Anemia    history of   Anxiety    Asthma    Depression    Diabetes mellitus    type 2    Fibromyalgia    Gastric ulcer    GERD (gastroesophageal reflux disease)    Graves disease    H/O therapeutic radiation    Migraine    Multiple thyroid nodules    Myofacial muscle pain    Osteoarthritis    Facet hypertrophy with injections   TIA (transient ischemic attack)    02/2015    Past Surgical History:  Procedure Laterality Date   AXILLARY LYMPH NODE DISSECTION  2001   CHOLECYSTECTOMY N/A 09/03/2018   Procedure: LAPAROSCOPIC CHOLECYSTECTOMY;  Surgeon: Coralie Keens, MD;  Location: [facility];  Service: General;  Laterality: N/A;   [place] Left 03/11/2017   Procedure: HYSTERECTOMY VAGINAL W/LEFT PROXIMAL SALPINGECTOMY;  Surgeon: Princess Bruins, MD;  Location: [facility];  Service: Gynecology;  Laterality: Left;  request 7:30am OR time  request 2 hours      Family History  Problem Relation Age of Onset   Hypertension Mother    Diabetes Mother    Cancer Mother  rectal   Stroke Father    Heart disease Father 26       MI x5   Hypertension Father    Diabetes Father    Breast cancer Sister        unsure of age , was between 75 and 1   Drug abuse Brother    Alcohol abuse Brother    Drug abuse Brother    Alcohol abuse Brother    Anxiety disorder Neg Hx    Bipolar disorder Neg Hx    Depression Neg Hx     Social History:  reports that she has been smoking cigarettes. She has a 20.50 pack-year smoking history.  She has never used smokeless tobacco. She reports that she does not currently use drugs. She reports that she does not drink alcohol.  Review of Systems:  Last diabetic eye exam date 8/23  Last foot exam date: 10/22  No recent urine microalbumin available  Hypertension:   Treatment includes Benicar HCTZ 20/12.5 from her PCP and she thinks her blood pressure was only 123456 systolic with her PCP this week Previously on amlodipine Keeping home BP check periodically   BP Readings from Last 3 Encounters:  09/04/22 (!) 150/84  05/31/22 130/72  02/05/22 130/78   Lab Results  Component Value Date   CREATININE 1.16 (H) 08/30/2022   CREATININE 1.21 (H) 05/27/2022   CREATININE 1.07 02/05/2022    Lipids: Now treated with atorvastatin 20 mg daily    Lab Results  Component Value Date   CHOL 102 11/16/2021   CHOL 122 (L) 02/02/2016   CHOL 118 03/16/2015   Lab Results  Component Value Date   HDL 34 (L) 11/16/2021   HDL 41 (L) 02/02/2016   HDL 37 (L) 03/16/2015   Lab Results  Component Value Date   LDLCALC 28 11/16/2021   LDLCALC 55 02/02/2016   LDLCALC 58 03/16/2015   Lab Results  Component Value Date   TRIG 199 (H) 11/16/2021   TRIG 131 02/02/2016   TRIG 113 03/16/2015   Lab Results  Component Value Date   CHOLHDL 3.0 11/16/2021   CHOLHDL 3.0 02/02/2016   CHOLHDL 3.2 03/16/2015   Lab Results  Component Value Date   LDLDIRECT 60 11/08/2011   LDLDIRECT 57 11/16/2010   HYPOTHYROIDISM: She has had hypothyroidism since she had I-131 for Graves' disease in 2012  In 2022 she was taking 224 mcg levothyroxine and because of persistently low TSH her levothyroxine was reduced to 112 mcg during her hospital stay in 5/23 She is now taking 137 mcg   Lab Results  Component Value Date   TSH 3.760 08/30/2022   TSH 1.43 02/05/2022   TSH 11.91 (H) 12/11/2021   FREET4 1.76 08/30/2022   FREET4 1.14 02/05/2022   FREET4 0.76 12/11/2021   Lab Results  Component Value Date    TSH 3.760 08/30/2022   TSH 1.43 02/05/2022   TSH 11.91 (H) 12/11/2021   TSH 0.014 (L) 11/16/2021   TSH <0.010 (L) 11/15/2021   TSH 0.02 (L) 10/18/2021   TSH 0.13 (L) 08/30/2021    She takes Cymbalta and Remeron for depression   Examination:   BP (!) 150/84   Pulse 86   Ht '5\' 3"'$  (1.6 m)   Wt 212 lb 12.8 oz (96.5 kg)   LMP 12/06/2016 (Approximate)   SpO2 98%   BMI 37.70 kg/m   Body mass index is 37.7 kg/m.    ASSESSMENT/ PLAN:    Diabetes type 2 insulin  requiring with obesity:   See history of present illness for detailed discussion of current diabetes management, blood sugar patterns and problems identified  Current regimen: Toujeo 36 units daily; Jardiance 10 mg daily, Mounjaro 7.5 mg weekly  A1c is further improved at 6.5  Blood glucose control is significantly better with continuing Mounjaro She is concerned about not losing weight but this may be related to some tendency to hypoglycemia until recently as well as lack of adequate exercise She thinks she is controlling portions and calories Has not increased her Jardiance because of borderline renal function Day-to-day management, diet and insulin adjustment was discussed in detail  Recommendations:  Continue working on diet  Including exercise especially on weekends to average about 30 minutes brisk walking 5 days a week When finished with 7.5 increase Mounjaro to 12.5 mg weekly for the next 3 months Continue same dose of Jardiance Continue 36 units of Toujeo unless fasting blood sugars are getting relatively low Blood sugar targets before and after meals discussed Discussed adjustment of mealtime dose based on what she is eating and likely only needs 4 units for small carbohydrate meal and 6 units for larger carbohydrate meals She can still skip Humalog for low carbohydrate meals like salads Recheck A1c in 3 months  Renal function is slightly better  HYPOTHYROIDISM: She is taking her 137 mcg levothyroxine  regularly and thyroid levels are normal  Hypertension: Blood pressure is relatively high and she will not take olmesartan daily However needs to follow-up with her PCP   Patient Instructions  Take olmesartan daily  If eating larger meal or high carbohydrate meal take no more than 6 units When eating out may just take the insulin right before eating  Increase exercise to at least 30 minutes of brisk walking 5 days a week  Follow-up with your primary doctor for blood pressure management   Elayne Snare 09/05/2022, 2:49 PM

## 2022-09-09 ENCOUNTER — Encounter: Payer: Self-pay | Admitting: Endocrinology

## 2022-09-20 ENCOUNTER — Other Ambulatory Visit: Payer: Self-pay | Admitting: Endocrinology

## 2022-09-22 ENCOUNTER — Other Ambulatory Visit: Payer: Self-pay | Admitting: Endocrinology

## 2022-09-22 DIAGNOSIS — E890 Postprocedural hypothyroidism: Secondary | ICD-10-CM

## 2022-09-25 ENCOUNTER — Telehealth: Payer: Self-pay

## 2022-09-25 NOTE — Telephone Encounter (Signed)
Telephone call from patient, C/o a knot or something between her shoulder blade and neck are that causing a headache and lightheadedness.  Per Patient she has tried her Hinda Kehr but hasn't helped.  Advised patient with Hinda Kehr you may take another one after two hour but no more then two in 24 hours.  Patient wants to know if she could come in for a headache cocktail. She is unable to take prednisone or steroids at all.    Please advise.

## 2022-09-25 NOTE — Telephone Encounter (Signed)
Patient to come tomorrow,for headache cocktail.

## 2022-09-26 ENCOUNTER — Ambulatory Visit (INDEPENDENT_AMBULATORY_CARE_PROVIDER_SITE_OTHER): Payer: 59

## 2022-09-26 ENCOUNTER — Other Ambulatory Visit: Payer: Self-pay | Admitting: Neurology

## 2022-09-26 DIAGNOSIS — G43009 Migraine without aura, not intractable, without status migrainosus: Secondary | ICD-10-CM | POA: Diagnosis not present

## 2022-09-26 MED ORDER — METOCLOPRAMIDE HCL 5 MG/ML IJ SOLN: 10.0000 mg | Freq: Once | INTRAMUSCULAR | Status: AC

## 2022-09-26 MED ORDER — BACLOFEN 10 MG PO TABS: 10.0000 mg | ORAL_TABLET | Freq: Three times a day (TID) | ORAL | 5 refills | Status: DC | PRN

## 2022-09-26 MED ORDER — KETOROLAC TROMETHAMINE 60 MG/2ML IM SOLN
60.0000 mg | Freq: Once | INTRAMUSCULAR | Status: AC
  Administered 2022-09-26: 60 mg via INTRAMUSCULAR

## 2022-09-26 MED ORDER — DIPHENHYDRAMINE HCL 50 MG/ML IJ SOLN
50.0000 mg | Freq: Once | INTRAMUSCULAR | Status: AC
  Administered 2022-09-26: 25 mg via INTRAMUSCULAR

## 2022-09-26 NOTE — Telephone Encounter (Signed)
Patient wanted to know if the Baclofen could be called into the [facility] on [address]?

## 2022-09-26 NOTE — Telephone Encounter (Signed)
Done

## 2022-09-26 NOTE — Progress Notes (Signed)
Per chart patient, she do not have an allergy to Ibuprofen. Patient had a stomach ulcer at one point.  Headache cocktail: Toradol,Reglan, And Benadryl given Left Buttock without complications Patient tolerated well.

## 2022-10-16 ENCOUNTER — Ambulatory Visit (INDEPENDENT_AMBULATORY_CARE_PROVIDER_SITE_OTHER): Payer: 59 | Admitting: Pulmonary Disease

## 2022-10-16 ENCOUNTER — Encounter: Payer: Self-pay | Admitting: Pulmonary Disease

## 2022-10-16 VITALS — BP 134/82 | HR 86 | Ht 64.0 in | Wt 217.6 lb

## 2022-10-16 DIAGNOSIS — R0609 Other forms of dyspnea: Secondary | ICD-10-CM | POA: Diagnosis not present

## 2022-10-16 MED ORDER — ALBUTEROL SULFATE HFA 108 (90 BASE) MCG/ACT IN AERS: 2.0000 | INHALATION_SPRAY | RESPIRATORY_TRACT | 2 refills | Status: AC | PRN

## 2022-10-16 MED ORDER — FLUTICASONE FUROATE-VILANTEROL 100-25 MCG/ACT IN AEPB: 1.0000 | INHALATION_SPRAY | Freq: Every day | RESPIRATORY_TRACT | 2 refills | Status: DC

## 2022-10-16 NOTE — Progress Notes (Signed)
Kendra Hall    [id number]    1965-10-02  Primary Care Physician:McCoy, Fleet Contras, NP  Referring Physician: Courtney Paris, NP [address] [place],  [place] [address]  Chief complaint:   Patient being seen for chronic cough  HPI:  Has had a cough since about February  Followed up at [facility] medical  Needs to use the cough medicine nightly to get some sleep Has been wheezing persistently as well  Recently quit smoking was only smoking about 4-6 cigarettes at the worst  History of hypertension, fibromyalgia, spinal stenosis, history of PTSD,  Does have a past history of asthma  Chest x-ray recently-was not told about any abnormal findings Believes that she had a pulmonary function test a couple years back  She admits to shortness of breath, headache, productive cough, wheezing, some postnasal drip  She does have a history of GERD-controlled symptoms on medications  History of obstructive sleep apnea for which she uses CPAP therapy nightly  Outpatient Encounter Medications as of 10/16/2022  Medication Sig   albuterol (PROVENTIL) (2.5 MG/3ML) 0.083% nebulizer solution Take 3 mLs by nebulization 4 (four) times daily as needed for wheezing or shortness of breath.    aspirin EC 81 MG tablet Take 1 tablet (81 mg total) by mouth daily. Swallow whole.   baclofen (LIORESAL) 10 MG tablet Take 1 tablet (10 mg total) by mouth 3 (three) times daily as needed for muscle spasms.   benzonatate (TESSALON) 200 MG capsule Take 200 mg by mouth 3 (three) times daily as needed for cough.   Blood Glucose Monitoring Suppl (ONETOUCH VERIO) w/Device KIT Check blood sugar once daily   DULoxetine (CYMBALTA) 60 MG capsule Take 60 mg by mouth 2 (two) times daily.   empagliflozin (JARDIANCE) 10 MG TABS tablet TAKE 1 TABLET(10 MG) BY MOUTH DAILY WITH BREAKFAST   fluticasone furoate-vilanterol (BREO ELLIPTA) 100-25 MCG/ACT AEPB Inhale 1 puff into the lungs daily.   Galcanezumab-gnlm (EMGALITY)  120 MG/ML SOAJ ADMINISTER 1 ML UNDER THE SKIN EVERY 30 DAYS   glucose blood (ONETOUCH VERIO) test strip CHECK YOUR BLOOD SUGAR ONCE  DAILY AS DIRECTED (VARY TIME OF  DAY WHEN CHECK, BEFORE 3 MEALS  AND AT BEDTIME)   HYDROcodone bit-homatropine (HYDROMET) 5-1.5 MG/5ML syrup Hydromet 5 mg-1.5 mg/5 mL oral syrup  TAKE 5 ML BY MOUTH FOUR TIMES DAILY AS NEEDED   insulin glargine, 2 Unit Dial, (TOUJEO MAX SOLOSTAR) 300 UNIT/ML Solostar Pen Inject 36 units into skin every morning.   insulin lispro (HUMALOG KWIKPEN) 100 UNIT/ML KwikPen INJECT SUBCUTANEOUSLY 15 TO 20  UNITS 3 TIMES DAILY BEFORE MEALS   Insulin Pen Needle (BD PEN NEEDLE NANO 2ND GEN) 32G X 4 MM MISC USE TO INJECT INSULIN 4 TIMES A DAY   lamoTRIgine (LAMICTAL) 150 MG tablet Take 200 mg by mouth at bedtime.   Lancets (ONETOUCH DELICA PLUS LANCET33G) MISC CHECK BLOOD SUGAR ONCE DAILY   levothyroxine (SYNTHROID) 137 MCG tablet TAKE 1 TABLET BY MOUTH DAILY  BEFORE BREAKFAST   linaclotide (LINZESS) 290 MCG CAPS capsule Take 290 mcg by mouth daily before breakfast.    Magnesium 250 MG TABS Take 750 mg by mouth at bedtime.   mirtazapine (REMERON) 7.5 MG tablet Take 7.5 mg by mouth at bedtime.   montelukast (SINGULAIR) 10 MG tablet Take 10 mg by mouth daily.    olmesartan-hydrochlorothiazide (BENICAR HCT) 20-12.5 MG tablet Take 1 tablet by mouth daily.   promethazine (PHENERGAN) 12.5 MG tablet TAKE  1 TABLET BY MOUTH EVERY 6  HOURS AS NEEDED FOR NAUSEA OR  VOMITING   tirzepatide (MOUNJARO) 12.5 MG/0.5ML Pen Inject 12.5 mg into the skin once a week.   zonisamide (ZONEGRAN) 50 MG capsule Take 1 capsule (50 mg total) by mouth daily.   [DISCONTINUED] albuterol (PROAIR HFA) 108 (90 Base) MCG/ACT inhaler Inhale 2 puffs into the lungs every 4 (four) hours as needed for wheezing.   albuterol (PROAIR HFA) 108 (90 Base) MCG/ACT inhaler Inhale 2 puffs into the lungs every 4 (four) hours as needed for wheezing.   atorvastatin (LIPITOR) 20 MG tablet Take 20 mg  by mouth every evening. (Patient not taking: Reported on 10/16/2022)   famotidine (PEPCID) 20 MG tablet Take 1 tablet (20 mg total) by mouth every evening.   [DISCONTINUED] acetaminophen (TYLENOL) 500 MG tablet Take 1,000 mg by mouth every 6 (six) hours as needed for moderate pain.   [DISCONTINUED] amLODipine (NORVASC) 10 MG tablet Take 10 mg by mouth daily.   [DISCONTINUED] clopidogrel (PLAVIX) 75 MG tablet Take 1 tablet (75 mg total) by mouth daily.   [DISCONTINUED] famciclovir (FAMVIR) 500 MG tablet Take 500 mg by mouth 2 (two) times daily.   [DISCONTINUED] hydrOXYzine (VISTARIL) 25 MG capsule Take 25 mg by mouth 3 (three) times daily as needed for itching.   Facility-Administered Encounter Medications as of 10/16/2022  Medication   metoCLOPramide (REGLAN) injection 10 mg    Allergies as of 10/16/2022 - Review Complete 10/16/2022  Allergen Reaction Noted   Gabapentin Other (See Comments) 11/08/2020   Ergotrate [ergonovine] Other (See Comments) 04/08/2016   Ibuprofen Other (See Comments) 02/14/2014   Latex Other (See Comments) 11/29/2018   Metformin and related Other (See Comments) 02/16/2016   Yutopar [ritodrine] Other (See Comments) 03/09/2015    Past Medical History:  Diagnosis Date   Allergy    Anemia    history of   Anxiety    Asthma    Depression    Diabetes mellitus    type 2    Fibromyalgia    Gastric ulcer    GERD (gastroesophageal reflux disease)    Graves disease    H/O therapeutic radiation    Migraine    Multiple thyroid nodules    Myofacial muscle pain    Osteoarthritis    Facet hypertrophy with injections   TIA (transient ischemic attack)    02/2015    Past Surgical History:  Procedure Laterality Date   AXILLARY LYMPH NODE DISSECTION  2001   CHOLECYSTECTOMY N/A 09/03/2018   Procedure: LAPAROSCOPIC CHOLECYSTECTOMY;  Surgeon: Abigail Miyamoto, MD;  Location: [facility];  Service: General;  Laterality: N/A;   HYDRADENITIS EXCISION     TUBAL LIGATION  1995    VAGINAL HYSTERECTOMY Left 03/11/2017   Procedure: HYSTERECTOMY VAGINAL W/LEFT PROXIMAL SALPINGECTOMY;  Surgeon: Genia Del, MD;  Location: [facility];  Service: Gynecology;  Laterality: Left;  request 7:30am OR time  request 2 hours      Family History  Problem Relation Age of Onset   Hypertension Mother    Diabetes Mother    Cancer Mother        rectal   Stroke Father    Heart disease Father 83       MI x5   Hypertension Father    Diabetes Father    Breast cancer Sister        unsure of age , was between 37 and 72   Drug abuse Brother    Alcohol abuse Brother  Drug abuse Brother    Alcohol abuse Brother    Anxiety disorder Neg Hx    Bipolar disorder Neg Hx    Depression Neg Hx     Social History   Socioeconomic History   Marital status: Single    Spouse name: Not on file   Number of children: 4   Years of education: 14   Highest education level: Not on file  Occupational History   Occupation: customer service    Employer: [employer]vertising [employer]  Tobacco Use   Smoking status: Former    Packs/day: 0.50    Years: 41.00    Additional pack years: 0.00    Total pack years: 20.50    Types: Cigarettes   Smokeless tobacco: Never  Vaping Use   Vaping Use: Former  Substance and Sexual Activity   Alcohol use: No    Comment: rare   Drug use: Not Currently    Comment: 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  Today denies all drug abuse   Sexual activity: Not Currently    Birth control/protection: Surgical  Other Topics Concern   Not on file  Social History Narrative   Lives alone in [place]. Her daugther occassoinally comes and stay with her. Pt is working Clinical biochemist at [employer]. Enjoys reading and pt has an associates in applied sciences.  originally from [place],  [place].  Raised by mom and her sister who is 8 yrs older than pt. Pt has several half siblings. No longer on disability. Pt has 4  kids, never married.    Right handed,    Lives with daughter story   Caffeine 2 cups every other day         Social Determinants of Health   Financial Resource Strain: Not on file  Food Insecurity: Not on file  Transportation Needs: Not on file  Physical Activity: Not on file  Stress: Not on file  Social Connections: Not on file  Intimate Partner Violence: Not on file    Review of Systems  Respiratory:  Positive for apnea, cough, shortness of breath and wheezing.   Psychiatric/Behavioral:  Positive for sleep disturbance.     Vitals:   10/16/22 1532  BP: 134/82  Pulse: 86  SpO2: 92%     Physical Exam Constitutional:      Appearance: She is obese.  HENT:     Head: Normocephalic.     Nose: Nose normal.     Mouth/Throat:     Mouth: Mucous membranes are moist.  Eyes:     General: No scleral icterus. Cardiovascular:     Rate and Rhythm: Normal rate and regular rhythm.     Heart sounds: No murmur heard.    No friction rub.  Pulmonary:     Effort: No respiratory distress.     Breath sounds: No stridor. No wheezing or rhonchi.  Musculoskeletal:     Cervical back: No rigidity.  Neurological:     Mental Status: She is alert.  Psychiatric:        Mood and Affect: Mood normal.    Data Reviewed: Sleep study on record shows severe obstructive sleep apnea with AHI in the 60s  Assessment:  Chronic cough  May be a postinfectious cough  History of reflux -Controlled symptoms  History of severe obstructive sleep apnea  Past history of asthma   Plan/Recommendations: Schedule patient for pulmonary function test  Prescription  for Breo and albuterol  Encouraged to continue using CPAP on a nightly basis  Obtain a report of a recent chest x-ray      Virl Diamond MD [facility] 10/16/2022, 3:57 PM  CC: Courtney Paris, NP

## 2022-10-16 NOTE — Patient Instructions (Signed)
Record release to get your recent chest x-ray result  Prescription for albuterol and Breo to go to pharmacy for you  Continue using your CPAP nightly  Schedule for pulmonary function test  I will see you back in about 4 to 6 weeks

## 2022-10-29 NOTE — Progress Notes (Signed)
Virtual Visit via Video Note  Consent was obtained for video visit:  Yes.   Answered questions that patient had about telehealth interaction:  Yes.   I discussed the limitations, risks, security and privacy concerns of performing an evaluation and management service by telemedicine. I also discussed with the patient that there may be a patient responsible charge related to this service. The patient expressed understanding and agreed to proceed.  Pt location: Home Physician Location: office Name of referring provider:  Courtney Paris, NP I connected with Lavell Anchors at patients initiation/request on 05/01/2022 at  3:30 PM EST by video enabled telemedicine application and verified that I am speaking with the correct person using two identifiers. Pt MRN:  [id number] Pt DOB:  28-Feb-1966 Video Participants:  Lavell Anchors;  Assessment/Plan:   1.  Migraine without aura, without status migrainosus, not intractable - controlled 2.  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   3. Chronic neck pain stable 4.  Chronic low back pain stable   Migraine prevention:  Emgality every 4 weeks, zonisamide 50mg  daily Migraine rescue:  She has samples of Ubrelvy.  Will try it again, knowing to take at earliest onset next time.  Due to cerebrovascular disease/history of TIA vs possible hemiplegic migraine, triptans are contraindicated Follow up 6 months.    Subjective:  Kendra Hall is a 57 year old woman with type 2 diabetes mellitus, hypothyroidism, fibromyalgia and depression who follows up for stroke-like event.    UPDATE: Due to ongoing chronic low back pain and intermittent left leg numbness despite physical therapy, she  had an MRI of lumbar spine on 05/15/2022, which was personally reviewed and revealed L4-L5 mild left-greater-than-right neural foraminal narrowing potentially affecting the descending L5 nerve roots.  She was referred to interventional pain management. Never received a call to schedule for an appointment but has since improved.    Last month, she began experiencing a "knot" between her shoulder blades causing neck pain and headache.  I had prescribed baclofen.  Saw ortho and received trigger points.  Improved.  Had one migraine since last visit, requiring migraine cocktail.  Took Ubrelvy too late.    Current NSAIDS:  none Current analgesics:  Hydrocodone most days for fibromyalgia but has been treating headaches with them as well Current triptans: none Current ergotamine: None Current anti-emetic: Promethazine 12.5mg  Current muscle relaxants:  baclofen 10mg  PRN Current anti-anxiolytic:  BuSpar Current sleep aide: None Current Antihypertensive medications: Would not use beta-blocker due to asthma Current Antidepressant medications:  Cymbalta 60mg  BID, mirtazapine 7.5mg  QHS Current Anticonvulsant medications: zonisamide 50mg  at bedtime, lamotrigine 200mg  QHS Current anti-CGRP:  Emgality  Current Vitamins/Herbal/Supplements: None Current Antihistamines/Decongestants: None Other therapy: None   Caffeine: No Diet: Drinks Exercise: Not routine Depression: Yes; Anxiety: Yes Other pain: Neck pain, fibromyalgia Sleep hygiene: She underwent sleep study and was diagnosed with severe OSA.  She is using a CPAP.  Sleep is improved   HISTORY: Onset: She has had history of migraines since her 63s, but they became frequent in 2018. Location:  Varies: back of head, across forehead, temples Quality:  Varies: squeezing, sharp Initial Intensity:  10/10 Aura:  no Prodrome:  no Postdrome:  no Associated symptoms: Nausea, photophobia, phonophobia.  She denies associated vomiting, visual disturbance,  or  unilateral numbness or weakness.  It is not a new thunderclap headache. Initial Duration:  All day (usually lets up in 30 minutes after taking Maxalt, however lately headache returns in 2 hours) Initial Frequency:  Every other day Initial Frequency of abortive medication: every other day Triggers:  None Relieving factors: Sleep Activity:  aggravates   History of recurrent stroke-like episodes with negative imaging.  In 2016, she had an episode of slurred speech with left sided weakness.  In 2019, she had an episode of left sided facial numbness.  In March 2022, she had twitching on the left side of her mouth lasting a second and intermittent slurred speech.  No unilateral numbness, weakness or headache.  CT head unremarkable.  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  Following this event, she had another episode of speech disturbance, trouble saying the correct word.  Admitted to hospital on 11/15/2021 for another episode of slurred speech and nausea.  Also endorsed neck pain and numbness radiating down left arm.  Left leg seemed slightly weak on exam.  Symptoms lasted a couple of hours.  MRI of brain revealed stable chronic small vessel ischemic changes but no acute stroke.  CTA of head and neck showed moderate focal narrowing of right V2 vertebral artery due to mass effect from degenerative bony spurring but no significant intracranial or extracranial stenosis or occlusion.  2D echocardiogram showed EF 70-75%.  LDL was 28.  She had a recent Hgb A1c in April that was 11.  TIA suspected.  In addition to ASA, she was placed on Plavix for 21 days followed again by ASA monotherapy.  Due to neck pain and reported left sided pain and weakness, she had an MRI of the cervical spine which revealed moderate spinal stenosis at C5-6 by no severe stenosis or cord abnormality.    Past  NSAIDS/steroids:  ibuprofen, prednisone (unable to take0 Past analgesics:  Tylenol, Excedrin Migraine Past abortive triptans:  Sumatriptan tablet, rizatriptan Past muscle relaxants:  no Past anti-emetic:  Zofran Past antihypertensive medications:  no Past antidepressant medications:  sertraline 50mg , nortriptyline 75mg  as needed for sleep (cannot take nightly due to extreme dry mouth), Lexapro 20mg  Past anticonvulsant medications:  topiramate immediate release 100mg  (cognitive problems), Topiramate ER 100 mg (effective), pregablin Past vitamins/Herbal/Supplements:  no Other past therapies:  no   Family history of headache:  cousins   MRI and MRA of head from 03/15/15 were personally reviewed and were unremarkable except for minimal punctate white matter foci.   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  CT cervical spine from 01/09/18 showed cervical spondylosis with degenerative changes greatest at C4-C7 levels with bilateral neural foraminal stenosis, as well as mild multifactorial C5-6 canal stenosis.  She is treated by pain management.  Endorses numbness and tingling in hands.  NCV-EMG of upper extremities in June 2022 revealed mild carpal tunnel syndrome.    Past Medical History: Past Medical History:  Diagnosis Date   Allergy    Anemia    history of   Anxiety    Asthma    Depression    Diabetes mellitus    type 2    Fibromyalgia    Gastric ulcer    GERD (gastroesophageal reflux disease)    Graves disease    H/O therapeutic radiation    Migraine    Multiple  thyroid nodules    Myofacial muscle pain    Osteoarthritis    Facet hypertrophy with injections   TIA (transient ischemic attack)    02/2015    Medications: Outpatient Encounter Medications as of 05/01/2022  Medication Sig   acetaminophen (TYLENOL) 500 MG tablet Take 1,000 mg by mouth every 6 (six) hours as needed for moderate pain.   albuterol (PROAIR HFA) 108 (90 Base)  MCG/ACT inhaler Inhale 2 puffs into the lungs every 4 (four) hours as needed for wheezing.   albuterol (PROVENTIL) (2.5 MG/3ML) 0.083% nebulizer solution Take 3 mLs by nebulization 4 (four) times daily as needed for wheezing or shortness of breath.    amLODipine (NORVASC) 10 MG tablet Take 10 mg by mouth daily.   aspirin EC 81 MG tablet Take 1 tablet (81 mg total) by mouth daily. Swallow whole.   atorvastatin (LIPITOR) 20 MG tablet Take 20 mg by mouth every evening.   benzonatate (TESSALON) 200 MG capsule Take 200 mg by mouth 3 (three) times daily as needed for cough.   Blood Glucose Monitoring Suppl (ONETOUCH VERIO) w/Device KIT Check blood sugar once daily   clopidogrel (PLAVIX) 75 MG tablet Take 1 tablet (75 mg total) by mouth daily.   DULoxetine (CYMBALTA) 60 MG capsule Take 60 mg by mouth 2 (two) times daily.   EMGALITY 120 MG/ML SOAJ ADMINISTER 1 ML UNDER THE SKIN EVERY 30 DAYS   famciclovir (FAMVIR) 500 MG tablet Take 500 mg by mouth 2 (two) times daily.   famotidine (PEPCID) 20 MG tablet Take 1 tablet (20 mg total) by mouth every evening.   glucose blood (ONETOUCH VERIO) test strip CHECK YOUR BLOOD SUGAR ONCE DAILY(VARY THE TIME OF DAY WHEN YOU CHECK BETWEEN, BEFORE THE 3 MEALS, AND AT BEDTIME)   HYDROcodone bit-homatropine (HYDROMET) 5-1.5 MG/5ML syrup Hydromet 5 mg-1.5 mg/5 mL oral syrup  TAKE 5 ML BY MOUTH FOUR TIMES DAILY AS NEEDED   hydrOXYzine (VISTARIL) 25 MG capsule Take 25 mg by mouth 3 (three) times daily as needed for itching.   insulin glargine, 2 Unit Dial, (TOUJEO MAX SOLOSTAR) 300 UNIT/ML Solostar Pen Inject 110 Units into the skin every morning. (Patient taking differently: Inject 50 Units into the skin every morning.)   Insulin Pen Needle (BD PEN NEEDLE NANO 2ND GEN) 32G X 4 MM MISC USE TO INJECT LANTUS UNDER THE SKIN EVERY MORNING   insulin regular human CONCENTRATED (HUMULIN R U-500 KWIKPEN) 500 UNIT/ML KwikPen ADMINISTER 50 UNITS UNDER THE SKIN 30 MINUTES BEFORE MEALS    JARDIANCE 10 MG TABS tablet TAKE 1 TABLET(10 MG) BY MOUTH DAILY WITH BREAKFAST   lamoTRIgine (LAMICTAL) 150 MG tablet Take 200 mg by mouth at bedtime.   Lancets (ONETOUCH ULTRASOFT) lancets Check blood sugar once daily.   levothyroxine (SYNTHROID) 137 MCG tablet Take 1 tablet (137 mcg total) by mouth daily before breakfast.   linaclotide (LINZESS) 290 MCG CAPS capsule Take 290 mcg by mouth daily before breakfast.    Magnesium 250 MG TABS Take 750 mg by mouth at bedtime.   mirtazapine (REMERON) 7.5 MG tablet Take 7.5 mg by mouth at bedtime.   montelukast (SINGULAIR) 10 MG tablet Take 10 mg by mouth daily.    nicotine (NICODERM CQ - DOSED IN MG/24 HOURS) 14 mg/24hr patch Place 1 patch (14 mg total) onto the skin daily.   promethazine (PHENERGAN) 12.5 MG tablet Take 12.5 mg by mouth every 6 (six) hours as needed for nausea or vomiting.   tirzepatide (MOUNJARO) 5 MG/0.5ML  Pen Inject 5 mg into the skin once a week.   zonisamide (ZONEGRAN) 50 MG capsule Take 1 capsule (50 mg total) by mouth daily.   No facility-administered encounter medications on file as of 05/01/2022.    Allergies: Allergies  Allergen Reactions   Gabapentin Other (See Comments)    unknown   Ergotrate [Ergonovine] Other (See Comments)    Pt does not ever want   Ibuprofen Other (See Comments)    Patient has ulcer   Latex Other (See Comments)    Breaks out   Metformin And Related Other (See Comments)    Dizziness   Yutopar [Ritodrine] Other (See Comments)    Pt does not ever want    Family History: Family History  Problem Relation Age of Onset   Hypertension Mother    Diabetes Mother    Cancer Mother        rectal   Stroke Father    Heart disease Father 32       MI x5   Hypertension Father    Diabetes Father    Breast cancer Sister        unsure of age , was between 67 and 64   Drug abuse Brother    Alcohol abuse Brother    Drug abuse Brother    Alcohol abuse Brother    Anxiety disorder Neg Hx    Bipolar  disorder Neg Hx    Depression Neg Hx     Observations/Objective:   No acute distress.  Alert and oriented.  Speech fluent and not dysarthric.  Language intact.     Follow Up Instructions:    -I discussed the assessment and treatment plan with the patient. The patient was provided an opportunity to ask questions and all were answered. The patient agreed with the plan and demonstrated an understanding of the instructions.   The patient was advised to call back or seek an in-person evaluation if the symptoms worsen or if the condition fails to improve as anticipated.   Cira Servant, DO    CC: Courtney Paris, NP

## 2022-10-30 ENCOUNTER — Encounter: Payer: Self-pay | Admitting: Neurology

## 2022-10-30 ENCOUNTER — Telehealth (INDEPENDENT_AMBULATORY_CARE_PROVIDER_SITE_OTHER): Payer: 59 | Admitting: Neurology

## 2022-10-30 VITALS — Ht 63.0 in | Wt 211.0 lb

## 2022-10-30 DIAGNOSIS — M5412 Radiculopathy, cervical region: Secondary | ICD-10-CM

## 2022-10-30 DIAGNOSIS — G43409 Hemiplegic migraine, not intractable, without status migrainosus: Secondary | ICD-10-CM

## 2022-10-30 DIAGNOSIS — G8929 Other chronic pain: Secondary | ICD-10-CM

## 2022-10-30 DIAGNOSIS — G43009 Migraine without aura, not intractable, without status migrainosus: Secondary | ICD-10-CM | POA: Diagnosis not present

## 2022-10-30 DIAGNOSIS — M545 Low back pain, unspecified: Secondary | ICD-10-CM

## 2022-10-30 NOTE — Patient Instructions (Signed)
Emgality and zonisamide Take Ubrelvy earliest onset and may repeat after 2 hours.  If no improvement, we can try a different medication

## 2022-11-04 ENCOUNTER — Ambulatory Visit: Payer: 59 | Admitting: Neurology

## 2022-11-25 ENCOUNTER — Telehealth: Payer: Self-pay | Admitting: Endocrinology

## 2022-11-25 MED ORDER — TIRZEPATIDE 12.5 MG/0.5ML ~~LOC~~ SOAJ: 12.5000 mg | SUBCUTANEOUS | 0 refills | Status: DC

## 2022-11-25 NOTE — Telephone Encounter (Signed)
Done

## 2022-11-25 NOTE — Telephone Encounter (Signed)
MEDICATION:  tirzepatide tirzepatide (MOUNJARO) 12.5 MG/0.5ML Pen  PHARMACY:  [facility] on [address] [place], [place]  HAS THE PATIENT CONTACTED THEIR PHARMACY?  Yes  IS THIS A 90 DAY SUPPLY : Yes  IS PATIENT OUT OF MEDICATION: Yes  IF NOT; HOW MUCH IS LEFT:   LAST APPOINTMENT DATE: @12 /8/23  NEXT APPOINTMENT DATE:@6 /19/2024  DO WE HAVE YOUR PERMISSION TO LEAVE A DETAILED MESSAGE?: Yes  OTHER COMMENTS: Patient changed pharmacy from [facility] to [facility] on [address] in [place], [place] and [place] [facility] is holding 2 boxes for her,but they will not hold for long.   **Let patient know to contact pharmacy at the end of the day to make sure medication is ready. **  ** Please notify patient to allow 48-72 hours to process**  **Encourage patient to contact the pharmacy for refills or they can request refills through [facility]**

## 2022-11-27 ENCOUNTER — Ambulatory Visit (INDEPENDENT_AMBULATORY_CARE_PROVIDER_SITE_OTHER): Payer: 59 | Admitting: Pulmonary Disease

## 2022-11-27 ENCOUNTER — Encounter: Payer: Self-pay | Admitting: Pulmonary Disease

## 2022-11-27 VITALS — BP 124/78 | HR 87 | Ht 64.0 in | Wt 206.6 lb

## 2022-11-27 DIAGNOSIS — R0609 Other forms of dyspnea: Secondary | ICD-10-CM

## 2022-11-27 MED ORDER — ARNUITY ELLIPTA 100 MCG/ACT IN AEPB: 1.0000 | INHALATION_SPRAY | Freq: Every day | RESPIRATORY_TRACT | 3 refills | Status: DC

## 2022-11-27 NOTE — Patient Instructions (Signed)
I will see you back in about 6 months  We can get the breathing study on the next day you come in or you can come in and do it before then  I will call in a prescription for a steroid inhaler to be used an hour or 2 before you usually have the wheezing episodes  Call us with significant concerns

## 2022-11-27 NOTE — Progress Notes (Signed)
Kendra Hall    [id number]    October 17, 1965  Primary Care Physician:Jackson, Rondel Oh, PA-C  Referring Physician: Courtney Paris, NP [address] [place],  [place] [address]  Chief complaint:   Patient being seen for chronic cough  HPI:  Has had a cough since about February  Followed up at [facility] to wheeze but cough is a lot better  Has not been using albuterol much because it makes her jittery  Recently quit smoking was only smoking about 4-6 cigarettes at the worst  History of hypertension, fibromyalgia, spinal stenosis, history of PTSD,  Does have a past history of asthma Was using Breo but ran out of it  Chest x-ray recently-was not told about any abnormal findings Believes that she had a pulmonary function test a couple years back  She admits to shortness of breath, headache, productive cough, wheezing, some postnasal drip  She does have a history of GERD-controlled symptoms on medications  History of obstructive sleep apnea for which she uses CPAP therapy nightly  Outpatient Encounter Medications as of 11/27/2022  Medication Sig   albuterol (PROAIR HFA) 108 (90 Base) MCG/ACT inhaler Inhale 2 puffs into the lungs every 4 (four) hours as needed for wheezing.   albuterol (PROVENTIL) (2.5 MG/3ML) 0.083% nebulizer solution Take 3 mLs by nebulization 4 (four) times daily as needed for wheezing or shortness of breath.    aspirin EC 81 MG tablet Take 1 tablet (81 mg total) by mouth daily. Swallow whole.   baclofen (LIORESAL) 10 MG tablet Take 1 tablet (10 mg total) by mouth 3 (three) times daily as needed for muscle spasms.   benzonatate (TESSALON) 200 MG capsule Take 200 mg by mouth 3 (three) times daily as needed for cough.   Blood Glucose Monitoring Suppl (ONETOUCH VERIO) w/Device KIT Check blood sugar once daily   DULoxetine (CYMBALTA) 60 MG capsule Take 60 mg by mouth 2 (two) times daily.   empagliflozin (JARDIANCE) 10 MG TABS tablet TAKE  1 TABLET(10 MG) BY MOUTH DAILY WITH BREAKFAST   fluticasone furoate-vilanterol (BREO ELLIPTA) 100-25 MCG/ACT AEPB Inhale 1 puff into the lungs daily.   Galcanezumab-gnlm (EMGALITY) 120 MG/ML SOAJ ADMINISTER 1 ML UNDER THE SKIN EVERY 30 DAYS   glucose blood (ONETOUCH VERIO) test strip CHECK YOUR BLOOD SUGAR ONCE  DAILY AS DIRECTED (VARY TIME OF  DAY WHEN CHECK, BEFORE 3 MEALS  AND AT BEDTIME)   HYDROcodone bit-homatropine (HYDROMET) 5-1.5 MG/5ML syrup    insulin glargine, 2 Unit Dial, (TOUJEO MAX SOLOSTAR) 300 UNIT/ML Solostar Pen Inject 36 units into skin every morning.   insulin lispro (HUMALOG KWIKPEN) 100 UNIT/ML KwikPen INJECT SUBCUTANEOUSLY 15 TO 20  UNITS 3 TIMES DAILY BEFORE MEALS   Insulin Pen Needle (BD PEN NEEDLE NANO 2ND GEN) 32G X 4 MM MISC USE TO INJECT INSULIN 4 TIMES A DAY   lamoTRIgine (LAMICTAL) 150 MG tablet Take 200 mg by mouth at bedtime.   Lancets (ONETOUCH DELICA PLUS LANCET33G) MISC CHECK BLOOD SUGAR ONCE DAILY   levothyroxine (SYNTHROID) 137 MCG tablet TAKE 1 TABLET BY MOUTH DAILY  BEFORE BREAKFAST   linaclotide (LINZESS) 290 MCG CAPS capsule Take 290 mcg by mouth daily before breakfast.    Magnesium 250 MG TABS Take 750 mg by mouth at bedtime.   mirtazapine (REMERON) 7.5 MG tablet Take 7.5 mg by mouth at bedtime.   montelukast (SINGULAIR) 10 MG tablet Take 10 mg by mouth daily.  olmesartan-hydrochlorothiazide (BENICAR HCT) 20-12.5 MG tablet Take 1 tablet by mouth daily.   promethazine (PHENERGAN) 12.5 MG tablet TAKE 1 TABLET BY MOUTH EVERY 6  HOURS AS NEEDED FOR NAUSEA OR  VOMITING   tirzepatide (MOUNJARO) 12.5 MG/0.5ML Pen Inject 12.5 mg into the skin once a week.   zonisamide (ZONEGRAN) 50 MG capsule Take 1 capsule (50 mg total) by mouth daily.   [DISCONTINUED] atorvastatin (LIPITOR) 20 MG tablet Take 20 mg by mouth every evening. (Patient not taking: Reported on 10/16/2022)   [DISCONTINUED] famotidine (PEPCID) 20 MG tablet Take 1 tablet (20 mg total) by mouth every  evening.   Facility-Administered Encounter Medications as of 11/27/2022  Medication   metoCLOPramide (REGLAN) injection 10 mg    Allergies as of 11/27/2022 - Review Complete 11/27/2022  Allergen Reaction Noted   Prednisone Other (See Comments) 11/27/2022   Gabapentin Other (See Comments) 11/08/2020   Ergotrate [ergonovine] Other (See Comments) 04/08/2016   Ibuprofen Other (See Comments) 02/14/2014   Latex Other (See Comments) 11/29/2018   Metformin and related Other (See Comments) 02/16/2016   Yutopar [ritodrine] Other (See Comments) 03/09/2015    Past Medical History:  Diagnosis Date   Allergy    Anemia    history of   Anxiety    Asthma    Depression    Diabetes mellitus    type 2    Fibromyalgia    Gastric ulcer    GERD (gastroesophageal reflux disease)    Graves disease    H/O therapeutic radiation    Migraine    Multiple thyroid nodules    Myofacial muscle pain    Osteoarthritis    Facet hypertrophy with injections   TIA (transient ischemic attack)    02/2015    Past Surgical History:  Procedure Laterality Date   AXILLARY LYMPH NODE DISSECTION  2001   CHOLECYSTECTOMY N/A 09/03/2018   Procedure: LAPAROSCOPIC CHOLECYSTECTOMY;  Surgeon: Abigail Miyamoto, MD;  Location: [facility];  Service: General;  Laterality: N/A;   HYDRADENITIS EXCISION     TUBAL LIGATION  1995   VAGINAL HYSTERECTOMY Left 03/11/2017   Procedure: HYSTERECTOMY VAGINAL W/LEFT PROXIMAL SALPINGECTOMY;  Surgeon: Genia Del, MD;  Location: [facility];  Service: Gynecology;  Laterality: Left;  request 7:30am OR time  request 2 hours      Family History  Problem Relation Age of Onset   Hypertension Mother    Diabetes Mother    Cancer Mother        rectal   Stroke Father    Heart disease Father 61       MI x5   Hypertension Father    Diabetes Father    Breast cancer Sister        unsure of age , was between 77 and 58   Drug abuse Brother    Alcohol abuse Brother    Drug abuse Brother     Alcohol abuse Brother    Anxiety disorder Neg Hx    Bipolar disorder Neg Hx    Depression Neg Hx     Social History   Socioeconomic History   Marital status: Single    Spouse name: Not on file   Number of children: 4   Years of education: 14   Highest education level: Not on file  Occupational History   Occupation: customer service    Employer: [employer]vertising [employer]  Tobacco Use   Smoking status: Former    Packs/day: 0.50    Years: 41.00    Additional pack  years: 0.00    Total pack years: 20.50    Types: Cigarettes   Smokeless tobacco: Never  Vaping Use   Vaping Use: Former  Substance and Sexual Activity   Alcohol use: No    Comment: rare   Drug use: Not Currently    Comment: 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  Today denies all drug abuse   Sexual activity: Not Currently    Birth control/protection: Surgical  Other Topics Concern   Not on file  Social History Narrative   Lives alone in [place]. Her daugther occassoinally comes and stay with her. Pt is working Clinical biochemist at [employer]. Enjoys reading and pt has an associates in applied sciences.  originally from [place],  [place].  Raised by mom and her sister who is 8 yrs older than pt. Pt has several half siblings. No longer on disability. Pt has 4 kids, never married.    Right handed,    Lives with daughter story   Caffeine 2 cups every other day         Social Determinants of Health   Financial Resource Strain: Not on file  Food Insecurity: Not on file  Transportation Needs: Not on file  Physical Activity: Not on file  Stress: Not on file  Social Connections: Not on file  Intimate Partner Violence: Not on file    Review of Systems  Respiratory:  Positive for apnea, cough, shortness of breath and wheezing.   Psychiatric/Behavioral:  Positive for sleep disturbance.     Vitals:   11/27/22 1521  BP: 124/78  Pulse: 87  SpO2: 97%      Physical Exam Constitutional:      Appearance: She is obese.  HENT:     Head: Normocephalic.     Nose: Nose normal.     Mouth/Throat:     Mouth: Mucous membranes are moist.  Eyes:     General: No scleral icterus. Cardiovascular:     Rate and Rhythm: Normal rate and regular rhythm.     Heart sounds: No murmur heard.    No friction rub.  Pulmonary:     Effort: No respiratory distress.     Breath sounds: No stridor. No wheezing or rhonchi.  Musculoskeletal:     Cervical back: No rigidity.  Neurological:     Mental Status: She is alert.  Psychiatric:        Mood and Affect: Mood normal.   Data Reviewed: Sleep study on record shows severe obstructive sleep apnea with AHI in the 60s  Assessment:  Chronic cough  Cough is a lot better  Nocturnal wheezing  History of reflux -Controlled symptoms  History of severe obstructive sleep apnea  Past history of asthma   Plan/Recommendations: Follow-up in about 6 months  Obtain pulmonary function test whenever she comes back  Prescription for Arnuity called to pharmacy for wheezing  Graded activities as tolerated   Virl Diamond MD [facility] 11/27/2022, 3:35 PM  CC: Courtney Paris, NP

## 2022-12-03 ENCOUNTER — Other Ambulatory Visit: Payer: Self-pay

## 2022-12-03 MED ORDER — TIRZEPATIDE 12.5 MG/0.5ML ~~LOC~~ SOAJ: 12.5000 mg | SUBCUTANEOUS | 0 refills | Status: DC

## 2022-12-06 ENCOUNTER — Other Ambulatory Visit (INDEPENDENT_AMBULATORY_CARE_PROVIDER_SITE_OTHER): Payer: 59

## 2022-12-06 ENCOUNTER — Telehealth: Payer: Self-pay

## 2022-12-06 DIAGNOSIS — E119 Type 2 diabetes mellitus without complications: Secondary | ICD-10-CM | POA: Diagnosis not present

## 2022-12-06 DIAGNOSIS — Z794 Long term (current) use of insulin: Secondary | ICD-10-CM | POA: Diagnosis not present

## 2022-12-06 LAB — BASIC METABOLIC PANEL
BUN: 21 mg/dL (ref 6–23)
CO2: 28 mEq/L (ref 19–32)
Calcium: 9.7 mg/dL (ref 8.4–10.5)
Chloride: 102 mEq/L (ref 96–112)
Creatinine, Ser: 1.03 mg/dL (ref 0.40–1.20)
GFR: 60.54 mL/min (ref >60.00)
Glucose, Bld: 114 mg/dL — ABNORMAL HIGH (ref 70–99)
Potassium: 4.3 mEq/L (ref 3.5–5.1)
Sodium: 140 mEq/L (ref 135–145)

## 2022-12-06 LAB — HEMOGLOBIN A1C: Hgb A1c MFr Bld: 6.3 % (ref 4.6–6.5)

## 2022-12-06 NOTE — Telephone Encounter (Signed)
Patient called to report Kendra Hall is having increased back pain and pain in her buttocks, for the past few days. Kendra Hall believes it is sciatica from her spinal stenosis. Kendra Hall states Everlena Cooper has not previously treated her for this. Kendra Hall is requesting medication be called in to her pharmacy to relieve the pain.   Please advise, thanks!

## 2022-12-06 NOTE — Telephone Encounter (Addendum)
Patient seen at [facility] today. Advised if If this is a new DX that hasn't been evaluated for then she will need to schedule a visit.    Per patient needs a 90 day supply for Baclofen.   Dr.Jaffe please advise

## 2022-12-06 NOTE — Telephone Encounter (Signed)
Patient called back to follow up on message and to ask if she could be seen today. Advised patient there had not been a response to her message yet. Also advised if she was in significant pain she may want to go to UC for evaluation, patient agrees.

## 2022-12-09 NOTE — Telephone Encounter (Signed)
I called patient to schedule her an appt. She stated she did not want to schedule an appt with jaffe. She had an MRI done and they are sending her to Rheumatology.

## 2022-12-10 NOTE — Progress Notes (Addendum)
Patient ID: Kendra Hall, female   DOB: 1966/03/01, 57 y.o.   MRN: [id number]           Reason for Appointment: Endocrinology follow-up   History of Present Illness   Diagnosis date: 2012  Previous history:  hypoglycemic drugs previously used : Trulicity, Invokana, glimepiride, Prandin and Victoza Insulin was started in 2020  Her blood sugars have been progressively higher since 2019 A1c range in the last few years is: 6-12.7  Recent history:     Non-insulin hypoglycemic drugs: Jardiance 10 mg daily, Mounjaro 12.5 mg weekly     Insulin regimen: Toujeo 36 units in the morning daily Humalog 6-8 units with high carbohydrate meals    Side effects from medications: Vomiting and constipation from Trulicity  Current self management, blood sugar patterns and problems identified:  A1c has improved to 6.3 compared to 6.5 She has continued to be using the 12.5 mg Mounjaro since 5/24 although did not get the supply in March and April However she still has lost 7 pounds She has continued to have fairly good blood sugars although recently her CGM data is only available since the 16th as she did not get a refill on time Because of knee pain she is not able to do much walking Generally controlling portions However sugars are higher sometimes at night because of sleep eating Has still continue to take Humalog with larger meals with generally good control Has not changed her Toujeo which is usually keeping her fasting readings averaging as low as 125 No nausea with Mounjaro She is continues to take Jardiance 10 mg,      Interpretation of her download from the Dexcom G6 for the last 72 hours:  Overnight blood sugars are appearing periodically higher the 1 AM-6 AM timeframe mostly but improving by mornings  Premeal blood sugars are generally at target with average readings as low as 105 before dinner  Postprandial readings after breakfast are rising only modestly on 1 or 2 occasions but on  an average even Blood sugars after lunch went up over target only 1 time after 2 hours After dinner readings are also contained within the target range with upper normal sugars on 2 days Except for low normal readings before dinnertime blood sugars are not hypoglycemic, morning sugars being low on one of the occasions most likely artifact   CGM use % of time   Average/GV 135/23  Time in range       90%  % Time Above 180 9  % Time above 250   % Time Below 70      Prior    CGM use % of time 93  2-week average/GV 132/21  Time in range      94%  % Time Above 180 5  % Time above 250   % Time Below 70 1     EXERCISE: She is some walking 3/7, 20 minutes cumulative           Dietician visit: Most recent: 7/23  Weight control: Previously maximum was 218  Wt Readings from Last 3 Encounters:  12/11/22 205 lb (93 kg)  11/27/22 206 lb 9.6 oz (93.7 kg)  10/30/22 211 lb (95.7 kg)         Diabetes labs:  Lab Results  Component Value Date   HGBA1C 6.3 12/06/2022   HGBA1C 6.5 (H) 08/30/2022   HGBA1C 7.2 (H) 05/27/2022   Lab Results  Component Value Date   LDLCALC 28 11/16/2021  CREATININE 1.03 12/06/2022     Allergies as of 12/11/2022       Reactions   Prednisone Other (See Comments)   Gabapentin Other (See Comments)   unknown   Ergotrate [ergonovine] Other (See Comments)   Pt does not ever want   Ibuprofen Other (See Comments)   Patient has ulcer   Latex Other (See Comments)   Breaks out   Metformin And Related Other (See Comments)   Dizziness   Yutopar [ritodrine] Other (See Comments)   Pt does not ever want        Medication List        Accurate as of December 11, 2022  8:24 AM. If you have any questions, ask your nurse or doctor.          albuterol (2.5 MG/3ML) 0.083% nebulizer solution Commonly known as: PROVENTIL Take 3 mLs by nebulization 4 (four) times daily as needed for wheezing or shortness of breath.   albuterol 108 (90 Base) MCG/ACT  inhaler Commonly known as: ProAir HFA Inhale 2 puffs into the lungs every 4 (four) hours as needed for wheezing.   Arnuity Ellipta 100 MCG/ACT Aepb Generic drug: Fluticasone Furoate Inhale 1 puff into the lungs daily.   aspirin EC 81 MG tablet Take 1 tablet (81 mg total) by mouth daily. Swallow whole.   baclofen 10 MG tablet Commonly known as: LIORESAL Take 1 tablet (10 mg total) by mouth 3 (three) times daily as needed for muscle spasms.   BD Pen Needle Nano 2nd Gen 32G X 4 MM Misc Generic drug: Insulin Pen Needle USE TO INJECT INSULIN 4 TIMES A DAY   benzonatate 200 MG capsule Commonly known as: TESSALON Take 200 mg by mouth 3 (three) times daily as needed for cough.   DULoxetine 60 MG capsule Commonly known as: CYMBALTA Take 60 mg by mouth 2 (two) times daily.   Emgality 120 MG/ML Soaj Generic drug: Galcanezumab-gnlm ADMINISTER 1 ML UNDER THE SKIN EVERY 30 DAYS   empagliflozin 10 MG Tabs tablet Commonly known as: Jardiance TAKE 1 TABLET(10 MG) BY MOUTH DAILY WITH BREAKFAST   Hydromet 5-1.5 MG/5ML syrup Generic drug: HYDROcodone bit-homatropine   insulin lispro 100 UNIT/ML KwikPen Commonly known as: HumaLOG KwikPen INJECT SUBCUTANEOUSLY 15 TO 20  UNITS 3 TIMES DAILY BEFORE MEALS What changed: additional instructions   lamoTRIgine 150 MG tablet Commonly known as: LAMICTAL Take 200 mg by mouth at bedtime.   levothyroxine 137 MCG tablet Commonly known as: SYNTHROID TAKE 1 TABLET BY MOUTH DAILY  BEFORE BREAKFAST   linaclotide 290 MCG Caps capsule Commonly known as: LINZESS Take 290 mcg by mouth daily before breakfast.   Magnesium 250 MG Tabs Take 750 mg by mouth at bedtime.   mirtazapine 7.5 MG tablet Commonly known as: REMERON Take 7.5 mg by mouth at bedtime.   montelukast 10 MG tablet Commonly known as: SINGULAIR Take 10 mg by mouth daily.   olmesartan-hydrochlorothiazide 20-12.5 MG tablet Commonly known as: BENICAR HCT Take 1 tablet by mouth  daily.   OneTouch Delica Plus Lancet33G Misc CHECK BLOOD SUGAR ONCE DAILY   OneTouch Verio test strip Generic drug: glucose blood CHECK YOUR BLOOD SUGAR ONCE  DAILY AS DIRECTED (VARY TIME OF  DAY WHEN CHECK, BEFORE 3 MEALS  AND AT BEDTIME)   OneTouch Verio w/Device Kit Check blood sugar once daily   promethazine 12.5 MG tablet Commonly known as: PHENERGAN TAKE 1 TABLET BY MOUTH EVERY 6  HOURS AS NEEDED FOR NAUSEA OR  VOMITING  tirzepatide 12.5 MG/0.5ML Pen Commonly known as: MOUNJARO Inject 12.5 mg into the skin once a week.   Toujeo Max SoloStar 300 UNIT/ML Solostar Pen Generic drug: insulin glargine (2 Unit Dial) Inject 36 units into skin every morning.   zonisamide 50 MG capsule Commonly known as: ZONEGRAN Take 1 capsule (50 mg total) by mouth daily.        Allergies:  Allergies  Allergen Reactions   Prednisone Other (See Comments)   Gabapentin Other (See Comments)    unknown   Ergotrate [Ergonovine] Other (See Comments)    Pt does not ever want   Ibuprofen Other (See Comments)    Patient has ulcer   Latex Other (See Comments)    Breaks out   Metformin And Related Other (See Comments)    Dizziness   Yutopar [Ritodrine] Other (See Comments)    Pt does not ever want    Past Medical History:  Diagnosis Date   Allergy    Anemia    history of   Anxiety    Asthma    Depression    Diabetes mellitus    type 2    Fibromyalgia    Gastric ulcer    GERD (gastroesophageal reflux disease)    Graves disease    H/O therapeutic radiation    Migraine    Multiple thyroid nodules    Myofacial muscle pain    Osteoarthritis    Facet hypertrophy with injections   TIA (transient ischemic attack)    02/2015    Past Surgical History:  Procedure Laterality Date   AXILLARY LYMPH NODE DISSECTION  2001   CHOLECYSTECTOMY N/A 09/03/2018   Procedure: LAPAROSCOPIC CHOLECYSTECTOMY;  Surgeon: Abigail Miyamoto, MD;  Location: [facility];  Service: General;  Laterality: N/A;    HYDRADENITIS EXCISION     TUBAL LIGATION  1995   VAGINAL HYSTERECTOMY Left 03/11/2017   Procedure: HYSTERECTOMY VAGINAL W/LEFT PROXIMAL SALPINGECTOMY;  Surgeon: Genia Del, MD;  Location: [facility];  Service: Gynecology;  Laterality: Left;  request 7:30am OR time  request 2 hours      Family History  Problem Relation Age of Onset   Hypertension Mother    Diabetes Mother    Cancer Mother        rectal   Stroke Father    Heart disease Father 37       MI x5   Hypertension Father    Diabetes Father    Breast cancer Sister        unsure of age , was between 26 and 3   Drug abuse Brother    Alcohol abuse Brother    Drug abuse Brother    Alcohol abuse Brother    Anxiety disorder Neg Hx    Bipolar disorder Neg Hx    Depression Neg Hx     Social History:  reports that she has quit smoking. Her smoking use included cigarettes. She has a 20.50 pack-year smoking history. She has never used smokeless tobacco. She reports that she does not currently use drugs. She reports that she does not drink alcohol.  Review of Systems:  Last diabetic eye exam date 8/23  Last foot exam date: 10/22  No recent urine microalbumin available  Hypertension:   Treatment includes Benicar HCTZ 20/12.5 from her PCP and she thinks her blood pressure was only 118 systolic with her PCP this week Previously on amlodipine Now home BP check ok   BP Readings from Last 3 Encounters:  12/11/22 124/80  11/27/22  124/78  10/16/22 134/82   Lab Results  Component Value Date   CREATININE 1.03 12/06/2022   CREATININE 1.16 (H) 08/30/2022   CREATININE 1.21 (H) 05/27/2022    Lipids: Now treated with atorvastatin 20 mg daily    Lab Results  Component Value Date   CHOL 102 11/16/2021   CHOL 122 (L) 02/02/2016   CHOL 118 03/16/2015   Lab Results  Component Value Date   HDL 34 (L) 11/16/2021   HDL 41 (L) 02/02/2016   HDL 37 (L) 03/16/2015   Lab Results  Component Value Date   LDLCALC 28  11/16/2021   LDLCALC 55 02/02/2016   LDLCALC 58 03/16/2015   Lab Results  Component Value Date   TRIG 199 (H) 11/16/2021   TRIG 131 02/02/2016   TRIG 113 03/16/2015   Lab Results  Component Value Date   CHOLHDL 3.0 11/16/2021   CHOLHDL 3.0 02/02/2016   CHOLHDL 3.2 03/16/2015   Lab Results  Component Value Date   LDLDIRECT 60 11/08/2011   LDLDIRECT 57 11/16/2010   HYPOTHYROIDISM: She has had hypothyroidism since she had I-131 for Graves' disease in 2012  In 2022 she was taking 224 mcg levothyroxine and because of persistently low TSH her levothyroxine was reduced to 112 mcg during her hospital stay in 5/23 She is now taking 137 mcg   Lab Results  Component Value Date   TSH 3.760 08/30/2022   TSH 1.43 02/05/2022   TSH 11.91 (H) 12/11/2021   FREET4 1.76 08/30/2022   FREET4 1.14 02/05/2022   FREET4 0.76 12/11/2021   Lab Results  Component Value Date   TSH 3.760 08/30/2022   TSH 1.43 02/05/2022   TSH 11.91 (H) 12/11/2021   TSH 0.014 (L) 11/16/2021   TSH <0.010 (L) 11/15/2021   TSH 0.02 (L) 10/18/2021   TSH 0.13 (L) 08/30/2021    She takes Cymbalta and Remeron for depression   Examination:   BP 124/80 (BP Location: Left Arm, Patient Position: Sitting, Cuff Size: Large)   Pulse 95   Ht 5\' 4"  (1.626 m)   Wt 205 lb (93 kg)   LMP 12/06/2016 (Approximate)   SpO2 97%   BMI 35.19 kg/m   Body mass index is 35.19 kg/m.  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:   See history of present illness for detailed discussion of current diabetes management, blood sugar  patterns and problems identified  Current regimen: Toujeo 36 units daily; Humalog up to 10 units before meals, Jardiance 10 mg daily, Mounjaro 12.5 mg weekly  A1c is further improved at 6.3  Blood glucose control is much easier for her with adding Mounjaro and continue Jardiance She has lost 7 pounds from better diet and controlling calories However currently not able to exercise Generally still requiring low doses of Humalog for larger meals  Recommendations:  Continue same doses of insulin Discussed blood sugar targets both after meals and fasting She will cut back on her Humalog if her postprandial readings are trending lower below 120 No change in Toujeo Continue same dose of Mounjaro as it appears to be effective She can still  skip Humalog for low carbohydrate meals like salads Recheck A1c in 3 months  Renal function is stable but needs urine microalbumin, will check today  HYPOTHYROIDISM: Will need for thyroid levels on the next visit  Hypertension: Blood pressure is now well-controlled   There are no Patient Instructions on file for this visit.   Reather Littler 12/11/2022, 8:24 AM

## 2022-12-11 ENCOUNTER — Encounter: Payer: Self-pay | Admitting: Endocrinology

## 2022-12-11 ENCOUNTER — Ambulatory Visit (INDEPENDENT_AMBULATORY_CARE_PROVIDER_SITE_OTHER): Payer: 59 | Admitting: Endocrinology

## 2022-12-11 VITALS — BP 124/80 | HR 95 | Ht 64.0 in | Wt 205.0 lb

## 2022-12-11 DIAGNOSIS — E890 Postprocedural hypothyroidism: Secondary | ICD-10-CM

## 2022-12-11 DIAGNOSIS — I10 Essential (primary) hypertension: Secondary | ICD-10-CM

## 2022-12-11 DIAGNOSIS — Z794 Long term (current) use of insulin: Secondary | ICD-10-CM

## 2022-12-11 DIAGNOSIS — E1165 Type 2 diabetes mellitus with hyperglycemia: Secondary | ICD-10-CM | POA: Diagnosis not present

## 2022-12-11 LAB — MICROALBUMIN / CREATININE URINE RATIO
Creatinine,U: 139.7 mg/dL
Microalb Creat Ratio: 1 mg/g (ref 0.0–30.0)
Microalb, Ur: 1.4 mg/dL (ref 0.0–1.9)

## 2022-12-12 ENCOUNTER — Encounter: Payer: Self-pay | Admitting: Endocrinology

## 2022-12-18 ENCOUNTER — Other Ambulatory Visit: Payer: Self-pay | Admitting: Endocrinology

## 2022-12-18 DIAGNOSIS — E1165 Type 2 diabetes mellitus with hyperglycemia: Secondary | ICD-10-CM

## 2023-01-01 ENCOUNTER — Other Ambulatory Visit: Payer: Self-pay | Admitting: Rheumatology

## 2023-01-01 ENCOUNTER — Ambulatory Visit
Admission: RE | Admit: 2023-01-01 | Discharge: 2023-01-01 | Disposition: A | Discharge status: Home / Self Care | Payer: 59 | Source: Ambulatory Visit | Attending: Rheumatology | Admitting: Rheumatology

## 2023-01-01 ENCOUNTER — Encounter: Payer: Self-pay | Admitting: Rheumatology

## 2023-01-01 DIAGNOSIS — R748 Abnormal levels of other serum enzymes: Secondary | ICD-10-CM

## 2023-01-02 ENCOUNTER — Encounter (HOSPITAL_COMMUNITY): Payer: Self-pay | Admitting: Emergency Medicine

## 2023-01-02 ENCOUNTER — Emergency Department (HOSPITAL_COMMUNITY): Payer: 59

## 2023-01-02 ENCOUNTER — Other Ambulatory Visit (HOSPITAL_COMMUNITY): Payer: 59

## 2023-01-02 ENCOUNTER — Other Ambulatory Visit: Payer: Self-pay

## 2023-01-02 ENCOUNTER — Emergency Department (HOSPITAL_COMMUNITY)
Admission: EM | Admit: 2023-01-02 | Discharge: 2023-01-02 | Disposition: A | Discharge status: Home / Self Care | Payer: 59 | Attending: Emergency Medicine | Admitting: Emergency Medicine

## 2023-01-02 DIAGNOSIS — Z7982 Long term (current) use of aspirin: Secondary | ICD-10-CM | POA: Diagnosis not present

## 2023-01-02 DIAGNOSIS — Z8673 Personal history of transient ischemic attack (TIA), and cerebral infarction without residual deficits: Secondary | ICD-10-CM | POA: Diagnosis not present

## 2023-01-02 DIAGNOSIS — R200 Anesthesia of skin: Secondary | ICD-10-CM | POA: Diagnosis present

## 2023-01-02 DIAGNOSIS — Z87891 Personal history of nicotine dependence: Secondary | ICD-10-CM | POA: Diagnosis not present

## 2023-01-02 DIAGNOSIS — Z7984 Long term (current) use of oral hypoglycemic drugs: Secondary | ICD-10-CM | POA: Diagnosis not present

## 2023-01-02 DIAGNOSIS — R202 Paresthesia of skin: Secondary | ICD-10-CM

## 2023-01-02 DIAGNOSIS — J45909 Unspecified asthma, uncomplicated: Secondary | ICD-10-CM | POA: Insufficient documentation

## 2023-01-02 DIAGNOSIS — Z794 Long term (current) use of insulin: Secondary | ICD-10-CM | POA: Insufficient documentation

## 2023-01-02 DIAGNOSIS — E119 Type 2 diabetes mellitus without complications: Secondary | ICD-10-CM | POA: Insufficient documentation

## 2023-01-02 DIAGNOSIS — I10 Essential (primary) hypertension: Secondary | ICD-10-CM | POA: Diagnosis not present

## 2023-01-02 LAB — CBC WITH DIFFERENTIAL/PLATELET
Abs Immature Granulocytes: 0.03 10*3/uL (ref 0.00–0.07)
Basophils Absolute: 0 10*3/uL (ref 0.0–0.1)
Basophils Relative: 0 %
Eosinophils Absolute: 0.2 10*3/uL (ref 0.0–0.5)
Eosinophils Relative: 2 %
HCT: 45.9 % (ref 36.0–46.0)
Hemoglobin: 14.8 g/dL (ref 12.0–15.0)
Immature Granulocytes: 0 %
Lymphocytes Relative: 32 %
Lymphs Abs: 2.5 10*3/uL (ref 0.7–4.0)
MCH: 27.3 pg (ref 26.0–34.0)
MCHC: 32.2 g/dL (ref 30.0–36.0)
MCV: 84.7 fL (ref 80.0–100.0)
Monocytes Absolute: 0.7 10*3/uL (ref 0.1–1.0)
Monocytes Relative: 8 %
Neutro Abs: 4.4 10*3/uL (ref 1.7–7.7)
Neutrophils Relative %: 58 %
Platelets: 285 10*3/uL (ref 150–400)
RBC: 5.42 MIL/uL — ABNORMAL HIGH (ref 3.87–5.11)
RDW: 18.5 % — ABNORMAL HIGH (ref 11.5–15.5)
WBC: 7.8 10*3/uL (ref 4.0–10.5)
nRBC: 0 % (ref 0.0–0.2)

## 2023-01-02 LAB — COMPREHENSIVE METABOLIC PANEL
ALT: 53 U/L — ABNORMAL HIGH (ref 0–44)
AST: 31 U/L (ref 15–41)
Albumin: 3.4 g/dL — ABNORMAL LOW (ref 3.5–5.0)
Alkaline Phosphatase: 192 U/L — ABNORMAL HIGH (ref 38–126)
Anion gap: 9 (ref 5–15)
BUN: 18 mg/dL (ref 6–20)
CO2: 26 mmol/L (ref 22–32)
Calcium: 8.6 mg/dL — ABNORMAL LOW (ref 8.9–10.3)
Chloride: 101 mmol/L (ref 98–111)
Creatinine, Ser: 0.95 mg/dL (ref 0.44–1.00)
GFR, Estimated: >60 mL/min (ref >60)
Glucose, Bld: 73 mg/dL (ref 70–99)
Potassium: 3.8 mmol/L (ref 3.5–5.1)
Sodium: 136 mmol/L (ref 135–145)
Total Bilirubin: 0.3 mg/dL (ref 0.3–1.2)
Total Protein: 6.8 g/dL (ref 6.5–8.1)

## 2023-01-02 LAB — URINALYSIS, ROUTINE W REFLEX MICROSCOPIC
Bilirubin Urine: NEGATIVE
Glucose, UA: >=500 mg/dL — AB
Hgb urine dipstick: NEGATIVE
Ketones, ur: NEGATIVE mg/dL
Nitrite: NEGATIVE
Protein, ur: NEGATIVE mg/dL
Specific Gravity, Urine: 1.021 (ref 1.005–1.030)
pH: 5 (ref 5.0–8.0)

## 2023-01-02 MED ORDER — FLUCONAZOLE 150 MG PO TABS
150.0000 mg | ORAL_TABLET | Freq: Once | ORAL | Status: AC
  Administered 2023-01-02: 150 mg via ORAL
  Filled 2023-01-02: qty 1

## 2023-01-02 MED ORDER — CEFDINIR 300 MG PO CAPS: 300.0000 mg | ORAL_CAPSULE | Freq: Two times a day (BID) | ORAL | 0 refills | Status: AC

## 2023-01-02 NOTE — ED Provider Notes (Signed)
[place] EMERGENCY DEPARTMENT AT [facility] Provider Note  CSN: [id number] Arrival date & time: 01/02/23 1808  Chief Complaint(s) Numbness  HPI Kendra Hall is a 57 y.o. female with history of diabetes presenting to the emergency department left-sided numbness.  Patient reports primarily numbness to the left upper extremity.  She reports that maybe her face feels a little numb.  She reports some weakness in the left upper extremity as well.  Her symptoms began around noon and she is still experiencing these symptoms.  She reports mild headache, mild neck pain.  No fevers or chills.  No trauma.  Has been ambulatory.  She also reports some calf pain since yesterday and feels like she pulled a muscle.  No chest pain or shortness of breath.  No back pain.  No abdominal pain.  No nausea, vomiting.   Past Medical History Past Medical History:  Diagnosis Date   Allergy    Anemia    history of   Anxiety    Asthma    Depression    Diabetes mellitus    type 2    Fibromyalgia    Gastric ulcer    GERD (gastroesophageal reflux disease)    Graves disease    H/O therapeutic radiation    Migraine    Multiple thyroid nodules    Myofacial muscle pain    Osteoarthritis    Facet hypertrophy with injections   TIA (transient ischemic attack)    02/2015   Patient Active Problem List   Diagnosis Date Noted   Stroke-like symptoms 11/15/2021   Dyslipidemia 11/15/2021   Cervical stenosis of spine 11/15/2021   Severe obstructive sleep apnea-hypopnea syndrome 03/22/2020   Sleep-related hypoxia 03/22/2020   Intractable episodic cluster headache 02/10/2020   Abnormal dreams 02/10/2020   Excessive daytime sleepiness 02/10/2020   Snoring 02/10/2020   Sleep related headaches 02/10/2020   Lt facial numbness 09/25/2017   Back pain 06/15/2017   Postoperative state 03/11/2017   Trigger point 11/28/2016   Health care maintenance 02/04/2016   Severe episode of recurrent major depressive  disorder, with psychotic features (HCC) 08/22/2015   GAD (generalized anxiety disorder) 08/22/2015   PTSD (post-traumatic stress disorder) 08/22/2015   Insomnia 08/22/2015   GERD (gastroesophageal reflux disease) 04/18/2015   Fibromyalgia 03/29/2015   Hypothyroidism    Type 2 diabetes mellitus without complication (HCC)    TIA (transient ischemic attack) 03/15/2015   Mild intermittent asthma 02/28/2012   Anxiety disorder 02/07/2012   Hypertension 01/07/2012   Hypothyroidism following radioiodine therapy 08/28/2011   Grave's disease 05/07/2011   Family hx-breast malignancy 11/20/2010   Chronic migraine without aura 05/04/2010   Left shoulder pain 09/08/2009   TOBACCO USER 03/11/2009   Notalgia 01/09/2009   Class 2 obesity due to excess calories with body mass index (BMI) of 37.0 to 37.9 in adult 09/27/2008   Mood disorder (HCC) 09/22/2008   Home Medication(s) Prior to Admission medications   Medication Sig Start Date End Date Taking? Authorizing Provider  cefdinir (OMNICEF) 300 MG capsule Take 1 capsule (300 mg total) by mouth 2 (two) times daily for 7 days. 01/02/23 01/09/23 Yes Lonell Grandchild, MD  albuterol (PROAIR HFA) 108 (90 Base) MCG/ACT inhaler Inhale 2 puffs into the lungs every 4 (four) hours as needed for wheezing. 10/16/22   Virl Diamond A, MD  albuterol (PROVENTIL) (2.5 MG/3ML) 0.083% nebulizer solution Take 3 mLs by nebulization 4 (four) times daily as needed for wheezing or shortness of breath.  03/02/18  [provider]  aspirin EC 81 MG tablet Take 1 tablet (81 mg total) by mouth daily. Swallow whole. 11/17/21   Leroy Sea, MD  baclofen (LIORESAL) 10 MG tablet Take 1 tablet (10 mg total) by mouth 3 (three) times daily as needed for muscle spasms. 09/26/22   Drema Dallas, DO  benzonatate (TESSALON) 200 MG capsule Take 200 mg by mouth 3 (three) times daily as needed for cough. Patient not taking: Reported on 12/11/2022 12/29/20   [provider]   Blood Glucose Monitoring Suppl (ONETOUCH VERIO) w/Device KIT Check blood sugar once daily 12/17/21   Reather Littler, MD  DULoxetine (CYMBALTA) 60 MG capsule Take 60 mg by mouth 2 (two) times daily. 08/09/19   [provider]  empagliflozin (JARDIANCE) 10 MG TABS tablet TAKE 1 TABLET(10 MG) BY MOUTH DAILY WITH BREAKFAST 06/03/22   Reather Littler, MD  Fluticasone Furoate (ARNUITY ELLIPTA) 100 MCG/ACT AEPB Inhale 1 puff into the lungs daily. 11/27/22   Olalere, Onnie Boer A, MD  Galcanezumab-gnlm (EMGALITY) 120 MG/ML SOAJ ADMINISTER 1 ML UNDER THE SKIN EVERY 30 DAYS 05/01/22   Everlena Cooper, Adam R, DO  glucose blood (ONETOUCH VERIO) test strip CHECK YOUR BLOOD SUGAR ONCE  DAILY AS DIRECTED (VARY TIME OF  DAY WHEN CHECK, BEFORE 3 MEALS  AND AT BEDTIME) 07/12/22   Reather Littler, MD  HYDROcodone bit-homatropine (HYDROMET) 5-1.5 MG/5ML syrup     [provider]  insulin glargine, 2 Unit Dial, (TOUJEO MAX SOLOSTAR) 300 UNIT/ML Solostar Pen INJECT SUBCUTANEOUSLY 36 UNITS  EVERY MORNING 12/19/22   Reather Littler, MD  insulin lispro (HUMALOG KWIKPEN) 100 UNIT/ML KwikPen INJECT SUBCUTANEOUSLY 15 TO 20  UNITS 3 TIMES DAILY BEFORE MEALS Patient taking differently: INJECT SUBCUTANEOUSLY 8 to 10  UNITS 3 TIMES DAILY BEFORE MEALS 07/12/22   Reather Littler, MD  Insulin Pen Needle (BD PEN NEEDLE NANO 2ND GEN) 32G X 4 MM MISC USE TO INJECT INSULIN 4 TIMES A DAY 07/12/22   Reather Littler, MD  lamoTRIgine (LAMICTAL) 150 MG tablet Take 200 mg by mouth at bedtime. 11/06/21   [provider]  Lancets (ONETOUCH DELICA PLUS [place]) MISC CHECK BLOOD SUGAR ONCE DAILY 05/29/22   Reather Littler, MD  levothyroxine (SYNTHROID) 137 MCG tablet TAKE 1 TABLET BY MOUTH DAILY  BEFORE BREAKFAST 09/23/22   Reather Littler, MD  linaclotide [facility]) 290 MCG CAPS capsule Take 290 mcg by mouth daily before breakfast.     [provider]  Magnesium 250 MG TABS Take 750 mg by mouth at bedtime.    [provider]  mirtazapine (REMERON) 7.5 MG  tablet Take 7.5 mg by mouth at bedtime. 01/21/21   [provider]  montelukast (SINGULAIR) 10 MG tablet Take 10 mg by mouth daily.  03/16/18   [provider]  olmesartan-hydrochlorothiazide (BENICAR HCT) 20-12.5 MG tablet Take 1 tablet by mouth daily. 10/05/22   [provider]  promethazine (PHENERGAN) 12.5 MG tablet TAKE 1 TABLET BY MOUTH EVERY 6  HOURS AS NEEDED FOR NAUSEA OR  VOMITING 06/12/22   Everlena Cooper, Adam R, DO  tirzepatide [facility]) 12.5 MG/0.5ML Pen Inject 12.5 mg into the skin once a week. 12/03/22   Reather Littler, MD  zonisamide (ZONEGRAN) 50 MG capsule Take 1 capsule (50 mg total) by mouth daily. 05/01/22   Drema Dallas, DO  Past Surgical History Past Surgical History:  Procedure Laterality Date   AXILLARY LYMPH NODE DISSECTION  2001   CHOLECYSTECTOMY N/A 09/03/2018   Procedure: LAPAROSCOPIC CHOLECYSTECTOMY;  Surgeon: Abigail Miyamoto, MD;  Location: [facility];  Service: General;  Laterality: N/A;   HYDRADENITIS EXCISION     TUBAL LIGATION  1995   VAGINAL HYSTERECTOMY Left 03/11/2017   Procedure: HYSTERECTOMY VAGINAL W/LEFT PROXIMAL SALPINGECTOMY;  Surgeon: Genia Del, MD;  Location: [facility];  Service: Gynecology;  Laterality: Left;  request 7:30am OR time  request 2 hours     Family History Family History  Problem Relation Age of Onset   Hypertension Mother    Diabetes Mother    Cancer Mother        rectal   Stroke Father    Heart disease Father 33       MI x5   Hypertension Father    Diabetes Father    Breast cancer Sister        unsure of age , was between 64 and 30   Drug abuse Brother    Alcohol abuse Brother    Drug abuse Brother    Alcohol abuse Brother    Anxiety disorder Neg Hx    Bipolar disorder Neg Hx    Depression Neg Hx     Social History Social History   Tobacco Use   Smoking status:  Former    Current packs/day: 0.50    Average packs/day: 0.5 packs/day for 41.0 years (20.5 ttl pk-yrs)    Types: Cigarettes   Smokeless tobacco: Never  Vaping Use   Vaping status: Former  Substance Use Topics   Alcohol use: No    Comment: rare   Drug use: Not Currently    Comment: 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  Today denies all drug abuse   Allergies Prednisone, Gabapentin, Ergotrate [ergonovine], Ibuprofen, Latex, Metformin and related, and Yutopar [ritodrine]  Review of Systems Review of Systems  All other systems reviewed and are negative.   Physical Exam Vital Signs  I have reviewed the triage vital signs BP (!) 170/93   Pulse 81   Temp 98.2 F (36.8 C) (Oral)   Resp 18   Ht 5\' 4"  (1.626 m)   Wt 91.2 kg   LMP 12/06/2016 (Approximate)   SpO2 100%   BMI 34.50 kg/m  Physical Exam Vitals and nursing note reviewed.  Constitutional:      General: She is not in acute distress.    Appearance: She is well-developed.  HENT:     Head: Normocephalic and atraumatic.     Mouth/Throat:     Mouth: Mucous membranes are moist.  Eyes:     Pupils: Pupils are equal, round, and reactive to light.  Cardiovascular:     Rate and Rhythm: Normal rate and regular rhythm.     Heart sounds: No murmur heard. Pulmonary:     Effort: Pulmonary effort is normal. No respiratory distress.     Breath sounds: Normal breath sounds.  Abdominal:     General: Abdomen is flat.     Palpations: Abdomen is soft.     Tenderness: There is no abdominal tenderness.  Musculoskeletal:        General: No tenderness.     Right lower leg: No edema.     Left lower leg: No edema.  Skin:    General: Skin is warm and dry.  Neurological:  General: No focal deficit present.     Mental Status: She is alert. Mental status is at baseline.     Comments: Cranial nerves II through XII intact, strength 5 out of 5 in the bilateral upper and bilateral  lower extremities, specifically including LUE.  No pronator drift noted.  Subjective sensation difference in the left upper extremity as compared to the remainder of the bilateral lower extremities and the right upper extremity.  No dysmetria on finger-nose-finger testing.  Psychiatric:        Mood and Affect: Mood normal.        Behavior: Behavior normal.     ED Results and Treatments Labs (all labs ordered are listed, but only abnormal results are displayed) Labs Reviewed  CBC WITH DIFFERENTIAL/PLATELET - Abnormal; Notable for the following components:      Result Value   RBC 5.42 (*)    RDW 18.5 (*)    All other components within normal limits  URINALYSIS, ROUTINE W REFLEX MICROSCOPIC - Abnormal; Notable for the following components:   APPearance HAZY (*)    Glucose, UA >=500 (*)    Leukocytes,Ua TRACE (*)    Bacteria, UA RARE (*)    All other components within normal limits  COMPREHENSIVE METABOLIC PANEL - Abnormal; Notable for the following components:   Calcium 8.6 (*)    Albumin 3.4 (*)    ALT 53 (*)    Alkaline Phosphatase 192 (*)    All other components within normal limits                                                                                                                          Radiology MR Cervical Spine Wo Contrast  Result Date: 01/02/2023 CLINICAL DATA:  Myelopathy, left-sided numbness, headache EXAM: MRI CERVICAL SPINE WITHOUT CONTRAST TECHNIQUE: Multiplanar, multisequence MR imaging of the cervical spine was performed. No intravenous contrast was administered. COMPARISON:  11/16/2021 MRI cervical spine FINDINGS: Evaluation is somewhat limited by motion artifact. Alignment: Straightening of the normal cervical lordosis. Trace retrolisthesis of C4 on C5, C5 on C6, and C6 on C7, unchanged. Vertebrae: No acute fracture, evidence of discitis, or suspicious osseous lesion. Congenitally short pedicles, which narrow the AP diameter of the spinal canal. Cord:  Normal signal and morphology. Spinal cord flattening at C4-C5 and C5-C6. Posterior Fossa, vertebral arteries, paraspinal tissues: Negative. Disc levels: C2-C3: No significant disc bulge. No spinal canal stenosis or neuroforaminal narrowing. C3-C4: Mild disc bulge. Facet and uncovertebral hypertrophy. Mild-to-moderate spinal canal stenosis, slightly progressed from the prior exam. Mild bilateral neural foraminal narrowing, likely unchanged. C4-C5: Moderate disc bulge with superimposed central disc protrusion, which indents the ventral thecal sac and spinal cord. Facet and uncovertebral hypertrophy. Moderate to severe spinal canal stenosis, which may have progressed from the prior exam, although it is motion limited. Mild-to-moderate left and severe right neural foraminal narrowing. C5-C6: Moderate disc bulge with central protrusion, which indents the ventral thecal sac and spinal cord. Facet  and uncovertebral hypertrophy. Severe spinal canal stenosis, which appears similar to the prior exam. Moderate to severe bilateral neural foraminal narrowing. C6-C7: Disc height loss and mild disc bulge. Facet and uncovertebral hypertrophy. Mild spinal canal stenosis. Moderate bilateral neural foraminal narrowing. C7-T1: No significant disc bulge. No spinal canal stenosis or neuroforaminal narrowing. IMPRESSION: 1. Evaluation is somewhat limited by motion. Within this limitation, at C5-C6, there is severe spinal canal stenosis and moderate to severe bilateral neural foraminal narrowing. 2. C4-C5 moderate to severe spinal canal stenosis, with severe right and mild-to-moderate left neural foraminal narrowing. 3. C3-C4 mild-to-moderate spinal canal stenosis and mild bilateral neural foraminal narrowing. 4. C6-C7 mild spinal canal stenosis and moderate bilateral neural foraminal narrowing. Electronically Signed   By: Wiliam Ke M.D.   On: 01/02/2023 23:17   MR BRAIN WO CONTRAST  Result Date: 01/02/2023 CLINICAL DATA:   Left-sided numbness, headache, stroke suspected EXAM: MRI HEAD WITHOUT CONTRAST TECHNIQUE: Multiplanar, multiecho pulse sequences of the brain and surrounding structures were obtained without intravenous contrast. COMPARISON:  11/15/2021 MRI head FINDINGS: Brain: No restricted diffusion to suggest acute or subacute infarct. No acute hemorrhage, mass, mass effect, or midline shift. No hydrocephalus or extra-axial collection. T2 hyperintense signal in the periventricular white matter, right middle cerebellar peduncle, and pons, likely the sequela of chronic small vessel ischemic disease. Vascular: Normal arterial flow voids. Skull and upper cervical spine: Normal marrow signal. Sinuses/Orbits: Clear paranasal sinuses. No acute finding in the orbits. Other: The mastoid air cells are well aerated. IMPRESSION: No acute intracranial process. No evidence of acute or subacute infarct. Electronically Signed   By: Wiliam Ke M.D.   On: 01/02/2023 23:02    Pertinent labs & imaging results that were available during my care of the patient were reviewed by me and considered in my medical decision making (see MDM for details).  Medications Ordered in ED Medications  fluconazole (DIFLUCAN) tablet 150 mg (150 mg Oral Given 01/02/23 2337)                                                                                                                                     Procedures Procedures  (including critical care time)  Medical Decision Making / ED Course   MDM:  57 year old female presenting to the emergency department with numbness.  Patient overall well-appearing, has some subjective sensory deficit.  Differential includes stroke or intracranial process, given comorbidities will obtain MRI to rule out this.  Given that she is continue to have symptoms if MRI is negative extremely low concern for TIA or alternative intracranial process.  Will also obtain MRI cervical spine given weakness and tingling in 1  extremity.  Patient also reports some calf pain, clinically no lower extremity edema or swelling, no redness, no recent travel or surgery, no history of DVT, overall low concern for DVT.  Vascular sonography has left for the day.  Anticipate ordering outpatient DVT  study if patient is ultimately discharged. Clinical Course as of 01/02/23 2347  Thu Jan 02, 2023  2344 MRI with no evidence of stroke.  MRI cervical spine with normal cervical spine cord signal.  Does show some cervical arthritis and stenosis.  Her symptoms have all resolved at this point.  She denies any numbness or weakness.  She does report that she has had some dysuria as well as symptoms of a yeast infection and request treatment.  Her urinalysis is indeterminate and contaminated but will treat given symptoms.  She also does have some yeast on her urinalysis we will treat with fluconazole.  Advise follow-up with neurosurgery as well as her primary physician.  Discussed with That she should come back tomorrow for her left leg ultrasound since ultrasound is gone for the day.  Will discharge patient to home. All questions answered. Patient comfortable with plan of discharge. Return precautions discussed with patient and specified on the after visit summary.  [WS]    Clinical Course User Index [WS] Lonell Grandchild, MD     Additional history obtained: -Additional history obtained from ems -External records from outside source obtained and reviewed including: Chart review including previous notes, labs, imaging, consultation notes including prior pmd notes   Lab Tests: -I ordered, reviewed, and interpreted labs.   The pertinent results include:   Labs Reviewed  CBC WITH DIFFERENTIAL/PLATELET - Abnormal; Notable for the following components:      Result Value   RBC 5.42 (*)    RDW 18.5 (*)    All other components within normal limits  URINALYSIS, ROUTINE W REFLEX MICROSCOPIC - Abnormal; Notable for the following  components:   APPearance HAZY (*)    Glucose, UA >=500 (*)    Leukocytes,Ua TRACE (*)    Bacteria, UA RARE (*)    All other components within normal limits  COMPREHENSIVE METABOLIC PANEL - Abnormal; Notable for the following components:   Calcium 8.6 (*)    Albumin 3.4 (*)    ALT 53 (*)    Alkaline Phosphatase 192 (*)    All other components within normal limits    Notable for mild LFT abnormalities   EKG   EKG Interpretation Date/Time:  Thursday January 02 2023 18:10:04 EDT Ventricular Rate:  79 PR Interval:  156 QRS Duration:  97 QT Interval:  406 QTC Calculation: 466 R Axis:   -37  Text Interpretation: Sinus rhythm Left axis deviation Confirmed by Alvino Blood ([id number]) on 01/02/2023 7:03:04 PM         Imaging Studies ordered: I ordered imaging studies including MRI brain and cervical spine On my interpretation imaging demonstrates no acute stroke or spinal cord process I independently visualized and interpreted imaging. I agree with the radiologist interpretation   Medicines ordered and prescription drug management: Meds ordered this encounter  Medications   fluconazole (DIFLUCAN) tablet 150 mg   cefdinir (OMNICEF) 300 MG capsule    Sig: Take 1 capsule (300 mg total) by mouth 2 (two) times daily for 7 days.    Dispense:  14 capsule    Refill:  0    -I have reviewed the patients home medicines and have made adjustments as needed    Cardiac Monitoring: The patient was maintained on a cardiac monitor.  I personally viewed and interpreted the cardiac monitored which showed an underlying rhythm of: NSR  Social Determinants of Health:  Diagnosis or treatment significantly limited by social determinants of health: obesity   Reevaluation: After the  interventions noted above, I reevaluated the patient and found that their symptoms have resolved  Co morbidities that complicate the patient evaluation  Past Medical History:  Diagnosis Date   Allergy    Anemia     history of   Anxiety    Asthma    Depression    Diabetes mellitus    type 2    Fibromyalgia    Gastric ulcer    GERD (gastroesophageal reflux disease)    Graves disease    H/O therapeutic radiation    Migraine    Multiple thyroid nodules    Myofacial muscle pain    Osteoarthritis    Facet hypertrophy with injections   TIA (transient ischemic attack)    02/2015      Dispostion: Disposition decision including need for hospitalization was considered, and patient discharged from emergency department.    Final Clinical Impression(s) / ED Diagnoses Final diagnoses:  Paresthesia     This chart was dictated using voice recognition software.  Despite best efforts to proofread,  errors can occur which can change the documentation meaning.    Lonell Grandchild, MD 01/02/23 [phone]

## 2023-01-02 NOTE — ED Triage Notes (Signed)
Pt BIB [facility] from doctors office due to numbness to the left side that started 4 hours ago.  Pt reports headache.  20g right forearm.  Left side restricted due to lymph node removal.  VS BP 173/81, Pulse 82, SpO2 98%, CBG 87

## 2023-01-02 NOTE — Discharge Instructions (Addendum)
We evaluated you for your numbness.  Your MRI did not show any signs of a stroke.  Your MRI of your neck did show some signs of cervical spine arthritis without any injury to your spinal cord.  Please schedule an appointment with Paticia Stack to discuss your spinal arthritis.  We have treated you for a yeast infection with fluconazole as well as cefdinir for a urinary infection.  Please follow-up closely with your primary doctor.  Return if you have any worsening symptoms or any new symptoms such as trouble swallowing, vision changes, weakness, facial droop, chest pain, abdominal pain, fevers or chills, or any other new symptoms.

## 2023-01-02 NOTE — ED Notes (Signed)
Called lab to add on urine culture ?

## 2023-01-03 ENCOUNTER — Encounter (HOSPITAL_COMMUNITY): Payer: 59

## 2023-01-03 ENCOUNTER — Telehealth: Payer: Self-pay | Admitting: Neurology

## 2023-01-03 NOTE — Telephone Encounter (Signed)
Pt is calling in stating that she went to [facility] for numbness in her L arm and they took her BP and it was 192/102 and they rushed her by ambulance to [facility] not sure if she was having a stroke.  Pt was calling our office to make Dr. Everlena Cooper aware that [facility] did a MRI and found it to be a pinched nerve and wanted to see what Dr. Everlena Cooper would like for her to do since in the past she has had TIA's and she is a little concerned about the numbness of the L arm.  Pt would like to have a call back.

## 2023-01-03 NOTE — Telephone Encounter (Signed)
Per Dr.Jaffe,  I do not think these are TIAs or strokes.  The left sided numbness/weakness may be migraine.  She does have pinched nerves in the neck that can be contributing but they have been so episodic in the past and associated with headache, it seems more likely  migraine.

## 2023-01-29 ENCOUNTER — Other Ambulatory Visit: Payer: Self-pay

## 2023-01-29 ENCOUNTER — Encounter (HOSPITAL_COMMUNITY): Payer: Self-pay

## 2023-01-29 ENCOUNTER — Emergency Department (HOSPITAL_BASED_OUTPATIENT_CLINIC_OR_DEPARTMENT_OTHER): Payer: 59

## 2023-01-29 ENCOUNTER — Emergency Department (HOSPITAL_COMMUNITY)
Admission: EM | Admit: 2023-01-29 | Discharge: 2023-01-29 | Disposition: A | Discharge status: Home / Self Care | Payer: 59 | Attending: Emergency Medicine | Admitting: Emergency Medicine

## 2023-01-29 DIAGNOSIS — Z91040 Latex allergy status: Secondary | ICD-10-CM | POA: Insufficient documentation

## 2023-01-29 DIAGNOSIS — S86112A Strain of other muscle(s) and tendon(s) of posterior muscle group at lower leg level, left leg, initial encounter: Secondary | ICD-10-CM | POA: Diagnosis not present

## 2023-01-29 DIAGNOSIS — X509XXA Other and unspecified overexertion or strenuous movements or postures, initial encounter: Secondary | ICD-10-CM | POA: Insufficient documentation

## 2023-01-29 DIAGNOSIS — Z794 Long term (current) use of insulin: Secondary | ICD-10-CM | POA: Insufficient documentation

## 2023-01-29 DIAGNOSIS — M79662 Pain in left lower leg: Secondary | ICD-10-CM | POA: Diagnosis not present

## 2023-01-29 DIAGNOSIS — Z7982 Long term (current) use of aspirin: Secondary | ICD-10-CM | POA: Diagnosis not present

## 2023-01-29 DIAGNOSIS — S86812A Strain of other muscle(s) and tendon(s) at lower leg level, left leg, initial encounter: Secondary | ICD-10-CM

## 2023-01-29 MED ORDER — HYDROCODONE-ACETAMINOPHEN 5-325 MG PO TABS: 1.0000 | ORAL_TABLET | Freq: Four times a day (QID) | ORAL | 0 refills | Status: AC | PRN

## 2023-01-29 MED ORDER — HYDROCODONE-ACETAMINOPHEN 5-325 MG PO TABS
1.0000 | ORAL_TABLET | Freq: Once | ORAL | Status: AC
  Administered 2023-01-29: 1 via ORAL
  Filled 2023-01-29: qty 1

## 2023-01-29 NOTE — ED Provider Notes (Signed)
[place] EMERGENCY DEPARTMENT AT [facility] Provider Note   CSN: [id number] Arrival date & time: 01/29/23  1126     History  Chief Complaint  Patient presents with   Leg Pain    RANDINE ASKEY is a 57 y.o. female.  Patient with history of fibromyalgia presents today with complaints of left calf pain.  She states that same began approximately 2 weeks ago when she stepped backwards off a step and felt a pop in her left calf.  She has had difficulty walking since then.  She states that she went to urgent care for same and told them incidentally that she had just traveled to [place] this weekend and they subsequently sent her here to have an ultrasound to rule out a blood clot in her legs.  She presents for same.  She states that her pain did not get worse over the weekend.  Denies any history of blood clots.  No shortness of breath or chest pain.  The history is provided by the patient. No language interpreter was used.  Leg Pain      Home Medications Prior to Admission medications   Medication Sig Start Date End Date Taking? Authorizing Provider  albuterol (PROAIR HFA) 108 (90 Base) MCG/ACT inhaler Inhale 2 puffs into the lungs every 4 (four) hours as needed for wheezing. 10/16/22   Virl Diamond A, MD  albuterol (PROVENTIL) (2.5 MG/3ML) 0.083% nebulizer solution Take 3 mLs by nebulization 4 (four) times daily as needed for wheezing or shortness of breath.  03/02/18   [provider]  aspirin EC 81 MG tablet Take 1 tablet (81 mg total) by mouth daily. Swallow whole. 11/17/21   Leroy Sea, MD  baclofen (LIORESAL) 10 MG tablet Take 1 tablet (10 mg total) by mouth 3 (three) times daily as needed for muscle spasms. 09/26/22   Drema Dallas, DO  benzonatate (TESSALON) 200 MG capsule Take 200 mg by mouth 3 (three) times daily as needed for cough. Patient not taking: Reported on 12/11/2022 12/29/20   [provider]  Blood Glucose Monitoring Suppl (ONETOUCH  VERIO) w/Device KIT Check blood sugar once daily 12/17/21   Reather Littler, MD  DULoxetine (CYMBALTA) 60 MG capsule Take 60 mg by mouth 2 (two) times daily. 08/09/19   [provider]  empagliflozin (JARDIANCE) 10 MG TABS tablet TAKE 1 TABLET(10 MG) BY MOUTH DAILY WITH BREAKFAST 06/03/22   Reather Littler, MD  Fluticasone Furoate (ARNUITY ELLIPTA) 100 MCG/ACT AEPB Inhale 1 puff into the lungs daily. 11/27/22   Olalere, Onnie Boer A, MD  Galcanezumab-gnlm (EMGALITY) 120 MG/ML SOAJ ADMINISTER 1 ML UNDER THE SKIN EVERY 30 DAYS 05/01/22   Everlena Cooper, Adam R, DO  glucose blood (ONETOUCH VERIO) test strip CHECK YOUR BLOOD SUGAR ONCE  DAILY AS DIRECTED (VARY TIME OF  DAY WHEN CHECK, BEFORE 3 MEALS  AND AT BEDTIME) 07/12/22   Reather Littler, MD  HYDROcodone bit-homatropine (HYDROMET) 5-1.5 MG/5ML syrup     [provider]  insulin glargine, 2 Unit Dial, (TOUJEO MAX SOLOSTAR) 300 UNIT/ML Solostar Pen INJECT SUBCUTANEOUSLY 36 UNITS  EVERY MORNING 12/19/22   Reather Littler, MD  insulin lispro (HUMALOG KWIKPEN) 100 UNIT/ML KwikPen INJECT SUBCUTANEOUSLY 15 TO 20  UNITS 3 TIMES DAILY BEFORE MEALS Patient taking differently: INJECT SUBCUTANEOUSLY 8 to 10  UNITS 3 TIMES DAILY BEFORE MEALS 07/12/22   Reather Littler, MD  Insulin Pen Needle (BD PEN NEEDLE NANO 2ND GEN) 32G X 4 MM MISC USE TO INJECT INSULIN  4 TIMES A DAY 07/12/22   Reather Littler, MD  lamoTRIgine (LAMICTAL) 150 MG tablet Take 200 mg by mouth at bedtime. 11/06/21   [provider]  Lancets (ONETOUCH DELICA PLUS [place]) MISC CHECK BLOOD SUGAR ONCE DAILY 05/29/22   Reather Littler, MD  levothyroxine (SYNTHROID) 137 MCG tablet TAKE 1 TABLET BY MOUTH DAILY  BEFORE BREAKFAST 09/23/22   Reather Littler, MD  linaclotide [facility]) 290 MCG CAPS capsule Take 290 mcg by mouth daily before breakfast.     [provider]  Magnesium 250 MG TABS Take 750 mg by mouth at bedtime.    [provider]  mirtazapine (REMERON) 7.5 MG tablet Take 7.5 mg by mouth at bedtime.  01/21/21   [provider]  montelukast (SINGULAIR) 10 MG tablet Take 10 mg by mouth daily.  03/16/18   [provider]  olmesartan-hydrochlorothiazide (BENICAR HCT) 20-12.5 MG tablet Take 1 tablet by mouth daily. 10/05/22   [provider]  promethazine (PHENERGAN) 12.5 MG tablet TAKE 1 TABLET BY MOUTH EVERY 6  HOURS AS NEEDED FOR NAUSEA OR  VOMITING 06/12/22   Everlena Cooper, Adam R, DO  tirzepatide [facility]) 12.5 MG/0.5ML Pen Inject 12.5 mg into the skin once a week. 12/03/22   Reather Littler, MD  zonisamide (ZONEGRAN) 50 MG capsule Take 1 capsule (50 mg total) by mouth daily. 05/01/22   Drema Dallas, DO      Allergies    Prednisone, Gabapentin, Ergotrate [ergonovine], Ibuprofen, Latex, Metformin and related, and Yutopar [ritodrine]    Review of Systems   Review of Systems  Musculoskeletal:  Positive for myalgias.  All other systems reviewed and are negative.   Physical Exam Updated Vital Signs BP (!) 186/91 (BP Location: Right Arm)   Pulse 88   Temp 98.5 F (36.9 C) (Oral)   Resp 20   Ht 5\' 4"  (1.626 m)   Wt 91.6 kg   LMP 12/06/2016 (Approximate)   SpO2 100%   BMI 34.67 kg/m  Physical Exam Vitals and nursing note reviewed.  Constitutional:      General: She is not in acute distress.    Appearance: Normal appearance. She is normal weight. She is not ill-appearing, toxic-appearing or diaphoretic.  HENT:     Head: Normocephalic and atraumatic.  Cardiovascular:     Rate and Rhythm: Normal rate.  Pulmonary:     Effort: Pulmonary effort is normal. No respiratory distress.  Musculoskeletal:        General: Normal range of motion.     Cervical back: Normal range of motion.     Comments: TTP left calf. No erythema or warmth. Trace swelling. ROM intact.  Able to plantarflex and dorsiflex the left foot fully with pain.  DP and PT pulses intact and 2+. No palpable cord. Negative thompson's test. No focal bony tenderness or deformity. Compartments soft. Ambulatory with  pain.   Skin:    General: Skin is warm and dry.  Neurological:     General: No focal deficit present.     Mental Status: She is alert.  Psychiatric:        Mood and Affect: Mood normal.        Behavior: Behavior normal.     ED Results / Procedures / Treatments   Labs (all labs ordered are listed, but only abnormal results are displayed) Labs Reviewed - No data to display  EKG None  Radiology VAS [place] LOWER EXTREMITY VENOUS (DVT) (ONLY [facility] & [facility])  Result Date: 01/29/2023  Lower  Venous DVT Study Patient Name:  TOSHIYE TAPPEN  Date of Exam:   01/29/2023 Medical Rec #: [id number]       Accession #:    [id number] Date of Birth: 25-Jun-1965       Patient Gender: F Patient Age:   80 years Exam Location:  [facility] Procedure:      VAS [place] LOWER EXTREMITY VENOUS (DVT) Referring Phys: HAYLEY NAASZ --------------------------------------------------------------------------------  Indications: Left calf pain x2 weeks.  Comparison Study: No prior studies. Performing Technologist: Jean Rosenthal RDMS, RVT  Examination Guidelines: A complete evaluation includes B-mode imaging, spectral Doppler, color Doppler, and power Doppler as needed of all accessible portions of each vessel. Bilateral testing is considered an integral part of a complete examination. Limited examinations for reoccurring indications may be performed as noted. The reflux portion of the exam is performed with the patient in reverse Trendelenburg.  +-----+---------------+---------+-----------+----------+--------------+ RIGHTCompressibilityPhasicitySpontaneityPropertiesThrombus Aging +-----+---------------+---------+-----------+----------+--------------+ CFV  Full           Yes      Yes                                 +-----+---------------+---------+-----------+----------+--------------+   +---------+---------------+---------+-----------+----------+--------------+ LEFT     CompressibilityPhasicitySpontaneityPropertiesThrombus  Aging +---------+---------------+---------+-----------+----------+--------------+ CFV      Full           Yes      Yes                                 +---------+---------------+---------+-----------+----------+--------------+ SFJ      Full                                                        +---------+---------------+---------+-----------+----------+--------------+ FV Prox  Full                                                        +---------+---------------+---------+-----------+----------+--------------+ FV Mid   Full                                                        +---------+---------------+---------+-----------+----------+--------------+ FV DistalFull                                                        +---------+---------------+---------+-----------+----------+--------------+ PFV      Full                                                        +---------+---------------+---------+-----------+----------+--------------+ POP      Full  Yes      Yes                                 +---------+---------------+---------+-----------+----------+--------------+ PTV      Full                                                        +---------+---------------+---------+-----------+----------+--------------+ PERO     Full                                                        +---------+---------------+---------+-----------+----------+--------------+     Summary: RIGHT: - No evidence of common femoral vein obstruction.  LEFT: - There is no evidence of deep vein thrombosis in the lower extremity.  - No cystic structure found in the popliteal fossa.  *See table(s) above for measurements and observations.    Preliminary     Procedures Procedures    Medications Ordered in ED Medications  HYDROcodone-acetaminophen (NORCO/VICODIN) 5-325 MG per tablet 1 tablet (has no administration in time range)    ED Course/ Medical Decision Making/  A&P                                 Medical Decision Making  Patient presents today with left calf pain after injury 2 weeks ago.  She is afebrile, nontoxic-appearing, and in no acute distress with reassuring vital signs.  Physical exam reveals TTP of the left calf.  No erythema or warmth.  No palpable cord.  The Achilles is patent.  No obvious deformity.  She is ambulatory with pain.  She was sent for DVT study from urgent care.  Same has been performed and resulted and reveals  Negative for DVT.  I personally reviewed and interpreted this imaging and agree with radiology interpretation.  Suspect patient's symptoms are due to a calf muscle strain.  Discussed with patient who is understanding and agreement with this.  No focal bony tenderness or deformity to warrant x-ray imaging.  Patient placed in a walking boot per her request.  Will give referral to orthopedics number to call to schedule appointment for follow-up.  Will also give a few doses of Vicodin for pain per her request.  PDMP reviewed.  Patient advised not to drive or operate heavy machinery while taking this medication. Evaluation and diagnostic testing in the emergency department does not suggest an emergent condition requiring admission or immediate intervention beyond what has been performed at this time.  Plan for discharge with close PCP follow-up.  Patient is understanding and amenable with plan, educated on red flag symptoms that would prompt immediate return.  Patient discharged in stable condition.  Final Clinical Impression(s) / ED Diagnoses Final diagnoses:  Strain of calf muscle, left, initial encounter    Rx / DC Orders ED Discharge Orders          Ordered    HYDROcodone-acetaminophen (NORCO/VICODIN) 5-325 MG tablet  Every 6 hours PRN        01/29/23 1725  An After Visit Summary was printed and given to the patient.     Vear Clock 01/29/23 1727    Rondel Baton, MD 01/30/23  1357

## 2023-01-29 NOTE — Discharge Instructions (Signed)
As we discussed, ultrasound imaging of your leg was negative for a blood clot.  I suspect you have a strain of your calf muscle causing your symptoms.  I have given you a walking boot to wear for support.  Wear this while walking.  I have also given you a prescription for a few doses of narcotic pain medication to help with your symptoms.  Please take this sparingly for severe pain only do not drive or operate heavy machinery while taking this medication as it can be sedating.  Please follow-up with orthopedics for continued evaluation and management of this injury.  I have given you a referral with a number to call to schedule an appointment.  Please call at your earliest convenience.  Return if development of any new or worsening symptoms.

## 2023-01-29 NOTE — Progress Notes (Signed)
Lower extremity venous left study completed.  Preliminary results relayed to Jearld Fenton, MD.  See CV Proc for preliminary results report.   Jean Rosenthal, RDMS, RVT

## 2023-01-29 NOTE — ED Triage Notes (Signed)
PT arrives via POV from home. Pt reports swelling and pain to left calf for the past 2 weeks. Recent long car ride over the weekend. No chest pain or sob. Pt AxOx4.

## 2023-01-29 NOTE — Progress Notes (Signed)
Orthopedic Tech Progress Note Patient Details:  Kendra Hall 05/30/1966 [id number]  Ortho Devices Type of Ortho Device: Crutches, CAM walker Ortho Device/Splint Location: LLE Ortho Device/Splint Interventions: Application, Adjustment   Post Interventions Patient Tolerated: Well, Ambulated well Instructions Provided: Adjustment of device   E  01/29/2023, 6:36 PM

## 2023-01-31 ENCOUNTER — Telehealth: Payer: Self-pay

## 2023-01-31 NOTE — Telephone Encounter (Signed)
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

## 2023-02-05 ENCOUNTER — Other Ambulatory Visit: Payer: Self-pay

## 2023-02-05 DIAGNOSIS — E1165 Type 2 diabetes mellitus with hyperglycemia: Secondary | ICD-10-CM

## 2023-02-05 DIAGNOSIS — E119 Type 2 diabetes mellitus without complications: Secondary | ICD-10-CM

## 2023-02-05 MED ORDER — TIRZEPATIDE 15 MG/0.5ML ~~LOC~~ SOAJ
15.0000 mg | SUBCUTANEOUS | 0 refills | Status: DC
Start: 2023-02-05 — End: 2023-03-05

## 2023-02-05 MED ORDER — TIRZEPATIDE 15 MG/0.5ML ~~LOC~~ SOAJ: 15.0000 mg | SUBCUTANEOUS | 0 refills | Status: DC

## 2023-02-05 NOTE — Telephone Encounter (Signed)
Mounjaro 15 mg has been sent to her pharmacy and she is aware

## 2023-02-05 NOTE — Telephone Encounter (Signed)
Kendra Hall states that she is already on 12.5 since she was seen the last time , please advise?

## 2023-03-01 ENCOUNTER — Other Ambulatory Visit: Payer: Self-pay | Admitting: Neurology

## 2023-03-05 ENCOUNTER — Telehealth: Payer: Self-pay | Admitting: Neurology

## 2023-03-05 ENCOUNTER — Ambulatory Visit (INDEPENDENT_AMBULATORY_CARE_PROVIDER_SITE_OTHER): Payer: 59 | Admitting: Endocrinology

## 2023-03-05 ENCOUNTER — Encounter: Payer: Self-pay | Admitting: Endocrinology

## 2023-03-05 VITALS — BP 140/80 | HR 97 | Ht 64.0 in | Wt 198.4 lb

## 2023-03-05 DIAGNOSIS — Z7985 Long-term (current) use of injectable non-insulin antidiabetic drugs: Secondary | ICD-10-CM | POA: Diagnosis not present

## 2023-03-05 DIAGNOSIS — Z7984 Long term (current) use of oral hypoglycemic drugs: Secondary | ICD-10-CM

## 2023-03-05 DIAGNOSIS — E1165 Type 2 diabetes mellitus with hyperglycemia: Secondary | ICD-10-CM

## 2023-03-05 DIAGNOSIS — E890 Postprocedural hypothyroidism: Secondary | ICD-10-CM

## 2023-03-05 DIAGNOSIS — E119 Type 2 diabetes mellitus without complications: Secondary | ICD-10-CM

## 2023-03-05 DIAGNOSIS — Z794 Long term (current) use of insulin: Secondary | ICD-10-CM | POA: Diagnosis not present

## 2023-03-05 LAB — TSH: TSH: 2.17 u[IU]/mL (ref 0.35–5.50)

## 2023-03-05 LAB — POCT GLYCOSYLATED HEMOGLOBIN (HGB A1C): Hemoglobin A1C: 6.5 % — AB (ref 4.0–5.6)

## 2023-03-05 LAB — T4, FREE: Free T4: 1.16 ng/dL (ref 0.60–1.60)

## 2023-03-05 MED ORDER — TOUJEO MAX SOLOSTAR 300 UNIT/ML ~~LOC~~ SOPN
PEN_INJECTOR | SUBCUTANEOUS | 3 refills | Status: DC
Start: 2023-03-05 — End: 2023-06-05

## 2023-03-05 MED ORDER — INSULIN LISPRO (1 UNIT DIAL) 100 UNIT/ML (KWIKPEN)
PEN_INJECTOR | SUBCUTANEOUS | 3 refills | Status: DC
Start: 2023-03-05 — End: 2023-04-18

## 2023-03-05 MED ORDER — TIRZEPATIDE 15 MG/0.5ML ~~LOC~~ SOAJ
15.0000 mg | SUBCUTANEOUS | 4 refills | Status: DC
Start: 2023-03-05 — End: 2023-06-05

## 2023-03-05 NOTE — Patient Instructions (Addendum)
Continue Toujeo 36 units daily. Mounjaro 15 mg weekly. Jardiance 10 mg daily. Humalog as needed for high carbohydrate meals 5 to 6 unit with his meal.  Lab for thyroid today.

## 2023-03-05 NOTE — Telephone Encounter (Signed)
Patient is needing a new RX for new supplies for CPAP machine

## 2023-03-05 NOTE — Progress Notes (Signed)
Outpatient Endocrinology Note [place] Tyra Gural, MD  03/05/23  Patient's Name: Kendra Hall    DOB: Sep 12, 1965    MRN: [id number]                                                    REASON OF VISIT: Follow-up of type 2 diabetes mellitus  PCP: Barnie Mort, PA-C  HISTORY OF PRESENT ILLNESS:   Kendra LIGGON is a 57 y.o. old female with past medical history listed below, is here for follow up of type 2 diabetes mellitus /postablative hypothyroidism.   Pertinent Diabetes History: Patient was diagnosed with type 2 diabetes mellitus in 2012.  She used to be on oral and non-insulin injectable medication in the past.  Insulin therapy was started in 2020.  Chronic Diabetes Complications : Retinopathy: no. Last ophthalmology exam was done on annually beginning of 2024, ?  March, no records available.  Nephropathy: no, on olmesartan. Peripheral neuropathy: no Coronary artery disease: no Stroke: no  Relevant comorbidities and cardiovascular risk factors: Obesity: yes Body mass index is 34.06 kg/m.  Hypertension: yes Hyperlipidemia. Yes, on statin  Current / Home Diabetic regimen includes: Toujeo 36 units in the morning daily. Humalog 6 to 8 units with high carb meals, not taking much. Jardiance 10 mg daily. Mounjaro 15 mg weekly.  Prior diabetic medications: Trulicity, Invokana, glimepiride, Prandin, Victoza in the past.  Glycemic data: She has One Touch glucometer, not able to download in the clinic today blood sugar reviewed from the glucometer directly as follows mostly acceptable blood sugar, no hypoglycemia occasional blood sugar in low 200. -Blood sugar 134, 137, 162, 140, 166, 167, 182, 137, 183, 211, 203, 166, 134, 145, 102, 170, 145, 221.  Had to use Dexcom G7 CGM in the past.  She now has freestyle libre 3 however not started yet.  She gets from the employer.  Hypoglycemia: Patient has no hypoglycemic episodes. Patient has hypoglycemia awareness.  Factors modifying  glucose control: 1.  Diabetic diet assessment: 3 meals a day.  2.  Staying active or exercising: Walking 3 to 4 days in a week.  3.  Medication compliance: compliant all of the time.  # Postablative hypothyroidism : -She has history of Graves' disease status post RAI ablation I-131 in 2012.  She is currently on thyroid hormone replacement/levothyroxine 137 mcg daily.  Interval history 03/05/23 Glucometer data as reviewed above.  She occasionally gets nausea, having nausea today in the clinic had Mounjaro injection yesterday.  She has been taking Mounjaro 15 mg weekly.  She wants to stay on Mounjaro 15 mg weekly and has not been bothering her much with nausea and vomiting.  She has been taking levothyroxine 137 mcg daily, takes in the morning as soon as she reports compliance.  No hypo and hyperthyroid symptoms.  REVIEW OF SYSTEMS As per history of present illness.   PAST MEDICAL HISTORY: Past Medical History:  Diagnosis Date   Allergy    Anemia    history of   Anxiety    Asthma    Depression    Diabetes mellitus    type 2    Fibromyalgia    Gastric ulcer    GERD (gastroesophageal reflux disease)    Graves disease    H/O therapeutic radiation    Migraine    Multiple thyroid nodules  Myofacial muscle pain    Osteoarthritis    Facet hypertrophy with injections   TIA (transient ischemic attack)    02/2015    PAST SURGICAL HISTORY: Past Surgical History:  Procedure Laterality Date   AXILLARY LYMPH NODE DISSECTION  2001   CHOLECYSTECTOMY N/A 09/03/2018   Procedure: LAPAROSCOPIC CHOLECYSTECTOMY;  Surgeon: Abigail Miyamoto, MD;  Location: [facility];  Service: General;  Laterality: N/A;   HYDRADENITIS EXCISION     TUBAL LIGATION  1995   VAGINAL HYSTERECTOMY Left 03/11/2017   Procedure: HYSTERECTOMY VAGINAL W/LEFT PROXIMAL SALPINGECTOMY;  Surgeon: Genia Del, MD;  Location: [facility];  Service: Gynecology;  Laterality: Left;  request 7:30am OR time  request 2 hours       ALLERGIES: Allergies  Allergen Reactions   Prednisone Other (See Comments)   Gabapentin Other (See Comments)    unknown   Ergotrate [Ergonovine] Other (See Comments)    Pt does not ever want   Ibuprofen Other (See Comments)    Patient has ulcer   Latex Other (See Comments)    Breaks out   Metformin And Related Other (See Comments)    Dizziness   Yutopar [Ritodrine] Other (See Comments)    Pt does not ever want    FAMILY HISTORY:  Family History  Problem Relation Age of Onset   Hypertension Mother    Diabetes Mother    Cancer Mother        rectal   Stroke Father    Heart disease Father 30       MI x5   Hypertension Father    Diabetes Father    Breast cancer Sister        unsure of age , was between 26 and 85   Drug abuse Brother    Alcohol abuse Brother    Drug abuse Brother    Alcohol abuse Brother    Anxiety disorder Neg Hx    Bipolar disorder Neg Hx    Depression Neg Hx     SOCIAL HISTORY: Social History   Socioeconomic History   Marital status: Single    Spouse name: Not on file   Number of children: 4   Years of education: 14   Highest education level: Not on file  Occupational History   Occupation: customer service    Employer: [employer]  Tobacco Use   Smoking status: Every Day    Current packs/day: 0.50    Average packs/day: 0.5 packs/day for 41.0 years (20.5 ttl pk-yrs)    Types: Cigarettes   Smokeless tobacco: Never  Vaping Use   Vaping status: Former  Substance and Sexual Activity   Alcohol use: No   Drug use: Not Currently    Comment: 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  Today denies all drug abuse   Sexual activity: Not Currently    Birth control/protection: Surgical  Other Topics Concern   Not on file  Social History Narrative   Lives alone in [place]. Her daugther occassoinally comes and stay with her. Pt is working Clinical biochemist at [employer]. Enjoys  reading and pt has an associates in applied sciences.  originally from [place],  [place].  Raised by mom and her sister who is 8 yrs older than pt. Pt has several half siblings. No longer on disability. Pt has 4 kids, never married.    Right handed,    Lives with daughter story  Caffeine 2 cups every other day         Social Determinants of Health   Financial Resource Strain: Low Risk  (10/10/2022)   Received from [facility], [facility]   Overall Financial Resource Strain (CARDIA)    Difficulty of Paying Living Expenses: Not very hard  Food Insecurity: No Food Insecurity (10/10/2022)   Received from [facility], [facility]   Hunger Vital Sign    Worried About Running Out of Food in the Last Year: Never true    Ran Out of Food in the Last Year: Never true  Transportation Needs: No Transportation Needs (10/10/2022)   Received from [facility], [facility]   PRAPARE - Transportation    Lack of Transportation (Medical): No    Lack of Transportation (Non-Medical): No  Physical Activity: Insufficiently Active (10/10/2022)   Received from [facility], [facility]   Exercise Vital Sign    Days of Exercise per Week: 3 days    Minutes of Exercise per Session: 30 min  Stress: Stress Concern Present (10/10/2022)   Received from [facility], [facility] - Occupational Stress Questionnaire    Feeling of Stress : Very much  Social Connections: Socially Integrated (10/10/2022)   Received from [facility], [facility]   Social Network    How would you rate your social network (family, work, friends)?: Good participation with social networks    MEDICATIONS:  Current Outpatient Medications  Medication Sig Dispense Refill   albuterol (PROAIR HFA) 108 (90 Base) MCG/ACT inhaler Inhale 2 puffs into the lungs every 4 (four) hours as needed for wheezing. 1 each 2   albuterol (PROVENTIL) (2.5 MG/3ML) 0.083% nebulizer solution Take 3 mLs  by nebulization 4 (four) times daily as needed for wheezing or shortness of breath.   3   aspirin EC 81 MG tablet Take 1 tablet (81 mg total) by mouth daily. Swallow whole. 30 tablet 11   baclofen (LIORESAL) 10 MG tablet Take 1 tablet (10 mg total) by mouth 3 (three) times daily as needed for muscle spasms. 90 each 5   Blood Glucose Monitoring Suppl (ONETOUCH VERIO) w/Device KIT Check blood sugar once daily 1 kit 0   DULoxetine (CYMBALTA) 60 MG capsule Take 60 mg by mouth 2 (two) times daily.     empagliflozin (JARDIANCE) 10 MG TABS tablet TAKE 1 TABLET(10 MG) BY MOUTH DAILY WITH BREAKFAST 90 tablet 3   Fluticasone Furoate (ARNUITY ELLIPTA) 100 MCG/ACT AEPB Inhale 1 puff into the lungs daily. 30 each 3   Galcanezumab-gnlm (EMGALITY) 120 MG/ML SOAJ ADMINISTER 1 ML UNDER THE SKIN EVERY 30 DAYS 1 mL 11   glucose blood (ONETOUCH VERIO) test strip CHECK YOUR BLOOD SUGAR ONCE  DAILY AS DIRECTED (VARY TIME OF  DAY WHEN CHECK, BEFORE 3 MEALS  AND AT BEDTIME) 100 strip 3   HYDROcodone bit-homatropine (HYDROMET) 5-1.5 MG/5ML syrup      HYDROcodone-acetaminophen (NORCO/VICODIN) 5-325 MG tablet Take 1 tablet by mouth every 6 (six) hours as needed for severe pain. 5 tablet 0   Insulin Pen Needle (BD PEN NEEDLE NANO 2ND GEN) 32G X 4 MM MISC USE TO INJECT INSULIN 4 TIMES A DAY 400 each 3   lamoTRIgine (LAMICTAL) 150 MG tablet Take 200 mg by mouth at bedtime.     Lancets (ONETOUCH DELICA PLUS LANCET33G) MISC CHECK BLOOD SUGAR ONCE DAILY 100 each 3   levothyroxine (SYNTHROID) 137 MCG tablet TAKE 1 TABLET BY MOUTH DAILY  BEFORE BREAKFAST 90 tablet 3   linaclotide (LINZESS) 290 MCG CAPS capsule Take 290 mcg by mouth daily before breakfast.      Magnesium 250 MG TABS Take 750 mg by mouth at bedtime.     mirtazapine (REMERON) 7.5 MG tablet Take 7.5 mg by mouth at bedtime.     montelukast (SINGULAIR) 10 MG tablet Take 10 mg by mouth daily.   3   olmesartan-hydrochlorothiazide (BENICAR HCT) 20-12.5 MG tablet Take 1  tablet by mouth daily.     promethazine (PHENERGAN) 12.5 MG tablet TAKE 1 TABLET BY MOUTH EVERY 6  HOURS AS NEEDED FOR NAUSEA OR  VOMITING 90 tablet 3   zonisamide (ZONEGRAN) 50 MG capsule TAKE 1 CAPSULE BY MOUTH DAILY 90 capsule 3   benzonatate (TESSALON) 200 MG capsule Take 200 mg by mouth 3 (three) times daily as needed for cough. (Patient not taking: Reported on 12/11/2022)     insulin glargine, 2 Unit Dial, (TOUJEO MAX SOLOSTAR) 300 UNIT/ML Solostar Pen INJECT SUBCUTANEOUSLY 36 UNITS  EVERY MORNING 12 mL 3   insulin lispro (HUMALOG KWIKPEN) 100 UNIT/ML KwikPen INJECT SUBCUTANEOUSLY 5 - 6  UNITS 3 TIMES DAILY BEFORE MEALS 15 mL 3   tirzepatide (MOUNJARO) 15 MG/0.5ML Pen Inject 15 mg into the skin once a week. 6 mL 4   Current Facility-Administered Medications  Medication Dose Route Frequency Provider Last Rate Last Admin   metoCLOPramide (REGLAN) injection 10 mg  10 mg Intramuscular Once Jaffe, Adam R, DO        PHYSICAL EXAM: Vitals:   03/05/23 0817  BP: (!) 140/80  Pulse: 97  SpO2: 99%  Weight: 198 lb 6.4 oz (90 kg)  Height: 5\' 4"  (1.626 m)   Body mass index is 34.06 kg/m.  Wt Readings from Last 3 Encounters:  03/05/23 198 lb 6.4 oz (90 kg)  01/29/23 202 lb (91.6 kg)  01/02/23 201 lb (91.2 kg)    General: Well developed, well nourished female in no apparent distress.  HEENT: AT/[place], no external lesions.  Eyes: Conjunctiva clear and no icterus. Neck: Neck supple  Lungs: Respirations not labored Neurologic: Alert, oriented, normal speech Extremities / Skin: Dry. No sores or rashes noted.  Psychiatric: Does not appear depressed or anxious  Diabetic Foot Exam - Simple   No data filed     LABS Reviewed Lab Results  Component Value Date   HGBA1C 6.5 (A) 03/05/2023   HGBA1C 6.3 12/06/2022   HGBA1C 6.5 (H) 08/30/2022   Lab Results  Component Value Date   FRUCTOSAMINE 250 02/05/2022   FRUCTOSAMINE 286 (H) 10/18/2021   FRUCTOSAMINE 182 (L) 04/15/2017   Lab Results   Component Value Date   CHOL 102 11/16/2021   HDL 34 (L) 11/16/2021   LDLCALC 28 11/16/2021   LDLDIRECT 60 11/08/2011   TRIG 199 (H) 11/16/2021   CHOLHDL 3.0 11/16/2021   Lab Results  Component Value Date   MICRALBCREAT 1.0 12/11/2022   Lab Results  Component Value Date   CREATININE 0.95 01/02/2023   Lab Results  Component Value Date   GFR 60.54 12/06/2022    ASSESSMENT / PLAN  1. Type 2 diabetes mellitus without complication, with long-term current use of insulin (HCC)   2. Hypothyroidism following radioiodine therapy   3. Long term current use of insulin (HCC)   4. Long term (current) use of oral hypoglycemic drugs   5. Long-term current use of injectable noninsulin antidiabetic medication   6. Uncontrolled type 2 diabetes mellitus with hyperglycemia,  with long-term current use of insulin (HCC)     Diabetes Mellitus type 2, complicated by no known complications. - Diabetic status / severity: Controlled.  Lab Results  Component Value Date   HGBA1C 6.5 (A) 03/05/2023    - Hemoglobin A1c goal : <7%  - Medications: No change.  Continue Toujeo 36 units daily. Mounjaro 15 mg weekly. Jardiance 10 mg daily. Humalog as needed for high carbohydrate meals 5 to 6 unit with his meal.  - Home glucose testing: Use freestyle libre 3 CGM and check as needed. - Discussed/ Gave Hypoglycemia treatment plan.  # Consult : not required at this time.   # Annual urine for microalbuminuria/ creatinine ratio, no microalbuminuria currently, continue ACE/ARB /olmesartan. Last  Lab Results  Component Value Date   MICRALBCREAT 1.0 12/11/2022    # Foot check nightly.  # Annual dilated diabetic eye exams.  Following with ophthalmology.  - Diet: Make healthy diabetic food choices - Life style / activity / exercise: Discussed.  2. Blood pressure  -  BP Readings from Last 1 Encounters:  03/05/23 (!) 140/80    - Control is in target.  - No change in current plans.  3. Lipid  status / Hyperlipidemia - Last  Lab Results  Component Value Date   LDLCALC 28 11/16/2021   - Continue atorvastatin 20 mg daily.  Used to be on atorvastatin, not on the medication list at this time, managed by primary care provider.  # Postablative hypothyroidism -Currently taking levothyroxine 137 mcg daily. -Check thyroid function test TSH, free T4 today.  Diagnoses and all orders for this visit:  Type 2 diabetes mellitus without complication, with long-term current use of insulin (HCC) -     POCT glycosylated hemoglobin (Hb A1C) -     tirzepatide (MOUNJARO) 15 MG/0.5ML Pen; Inject 15 mg into the skin once a week.  Hypothyroidism following radioiodine therapy -     T4, free; Future -     TSH; Future -     TSH -     T4, free  Long term current use of insulin (HCC)  Long term (current) use of oral hypoglycemic drugs  Long-term current use of injectable noninsulin antidiabetic medication  Uncontrolled type 2 diabetes mellitus with hyperglycemia, with long-term current use of insulin (HCC) -     insulin lispro (HUMALOG KWIKPEN) 100 UNIT/ML KwikPen; INJECT SUBCUTANEOUSLY 5 - 6  UNITS 3 TIMES DAILY BEFORE MEALS -     insulin glargine, 2 Unit Dial, (TOUJEO MAX SOLOSTAR) 300 UNIT/ML Solostar Pen; INJECT SUBCUTANEOUSLY 36 UNITS  EVERY MORNING    DISPOSITION Follow up in clinic in 3 months suggested.   All questions answered and patient verbalized understanding of the plan.  [place] Manav Pierotti, MD [facility] Endocrinology [facility] [address] [place], [address] [place], [place] [address] Phone # [phone]  At least part of this note was generated using voice recognition software. Inadvertent word errors may have occurred, which were not recognized during the proofreading process.

## 2023-03-05 NOTE — Telephone Encounter (Signed)
Advised patient we do not order CPAP machines. Please call [facility] or [facility]

## 2023-03-23 ENCOUNTER — Other Ambulatory Visit: Payer: Self-pay | Admitting: Neurology

## 2023-03-31 ENCOUNTER — Other Ambulatory Visit (HOSPITAL_COMMUNITY): Payer: Self-pay | Admitting: Nurse Practitioner

## 2023-03-31 DIAGNOSIS — R7989 Other specified abnormal findings of blood chemistry: Secondary | ICD-10-CM

## 2023-04-02 ENCOUNTER — Telehealth: Payer: Self-pay | Admitting: Neurology

## 2023-04-02 NOTE — Telephone Encounter (Signed)
[facility] called in stating an order for Cpap supplies had been faxed over and were checking on the status. Form can be faxed to [phone].

## 2023-04-02 NOTE — Telephone Encounter (Signed)
LMOVm we do not offer that services here at our office.

## 2023-04-10 ENCOUNTER — Other Ambulatory Visit: Payer: Self-pay | Admitting: Neurology

## 2023-04-15 ENCOUNTER — Telehealth: Payer: Self-pay | Admitting: Neurology

## 2023-04-15 NOTE — Telephone Encounter (Signed)
Patient states she had a fall about 38 minutes ago and two weeks ago.   Per patient she had some nausea,Vomiting. Patient denies hitting her head at anytime during her falls.  Per patient she has a headache as well today. No dizziness at all.   Patient keeps losing her balance, no numbness or tingling or weakness of the legs. Her knee is swollen.

## 2023-04-15 NOTE — Telephone Encounter (Signed)
Patient has been falling, not dizzy, just throwing up, has fell 3 times in the past 2 weeks

## 2023-04-17 NOTE — Progress Notes (Signed)
NEUROLOGY FOLLOW UP OFFICE NOTE  Kendra Hall [id number]  Assessment/Plan:   1. Falls - 3 falls in last 2 weeks.  Unclear etiology.  Neurologic exam reveals no lateralizing signs or abnormalities that would suggest intracranial etiology.  She has cervical spinal stenosis.  Except for some nonspecific increased reflexes in patellars, otherwise normal without pathologic reflexes.  Overall gait is unremarkable.   She defers referral to neurosurgery at this time for evaluation of the cervical spinal stenosis.  Will monitor but if she notes continued falls and worsening gait, then she will contact us. 2. Migraine without aura, without status migrainosus, not intractable - controlled 2.  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   3.  Chronic neck pain with cervical spondylosis and spinal stenosis 4.  Chronic low back pain stable   Migraine prevention:  Emgality every 4 weeks, zonisamide 50mg  daily Migraine rescue:  She has samples of Ubrelvy.  Will try it again, knowing to take at earliest onset next time.  Due to cerebrovascular disease/history of TIA vs possible hemiplegic migraine, triptans are contraindicated Follow up 6 months.   Total time spent in chart, reviewing ED note, MRIs and face to face with patient:  36 minutes.    Subjective:  Kendra Hall is a 57 year old woman with type 2 diabetes mellitus, hypothyroidism, fibromyalgia and depression who follows up for falls   UPDATE: On 01/02/2023, she had another episode of left sided numbness, involving face but mostly left arm with some weakness as well.  Seen in the ED.  MRI of brain revealed chronic small vessel ischemic changes in the cerebral white matter  and pons but no acute findings.  MRI of cervical spine showed spinal stenosis with cord flattening at C4-C5 and C5-C6 but no cord signal abnormality.  Symptoms resolved and she was discharged home.    Last week, she had a fall.  She was walking across the kitchen when she tripped on something and fell.  Then she had two falls this week.  She started feeling herself leaning to the left, so she corrected herself by pulling to the right and she fell.  No dizziness, leg weakness, visual changes.  The second time she fell, she had a slight headache afterward.  No associated left sided numbness and weakness.    Migraines are controlled on Emgality   Current NSAIDS:  none Current analgesics:  Hydrocodone most days for fibromyalgia but has been treating headaches with them as well Current triptans: none Current ergotamine: None Current anti-emetic: Promethazine 12.5mg  Current muscle relaxants:  baclofen 10mg  PRN Current anti-anxiolytic:  BuSpar Current sleep aide: None Current Antihypertensive medications: Would not use beta-blocker due to asthma Current Antidepressant medications:  Cymbalta 60mg  BID, mirtazapine 7.5mg  QHS Current Anticonvulsant medications: zonisamide 50mg  at bedtime, lamotrigine 200mg  QHS Current anti-CGRP:  Emgality  Current Vitamins/Herbal/Supplements: None Current Antihistamines/Decongestants: None Other therapy: None   Caffeine: No Diet: Drinks Exercise: Not routine Depression: Yes; Anxiety: Yes Other pain: Neck pain, fibromyalgia Sleep hygiene: She underwent sleep study and was diagnosed with severe OSA.  She is using a CPAP.  Sleep is improved   HISTORY: Migraines: Onset: She has had history of migraines since her 75s, but they became frequent in 2018. Location:  Varies: back of head,  across forehead, temples Quality:  Varies: squeezing, sharp Initial Intensity:  10/10 Aura:  no Prodrome:  no Postdrome:  no Associated symptoms: Nausea, photophobia, phonophobia.   She denies associated vomiting, visual disturbance, or unilateral numbness or weakness.  It is not a new thunderclap headache. Initial Duration:  All day (usually lets up in 30 minutes after taking Maxalt, however lately headache returns in 2 hours) Initial Frequency:  Every other day Initial Frequency of abortive medication: every other day Triggers:  None Relieving factors: Sleep Activity:  aggravates   Hemiplegic Migraines: History of recurrent stroke-like episodes with negative imaging.  In 2016, she had an episode of slurred speech with left sided weakness.  In 2019, she had an episode of left sided facial numbness.  In March 2022, she had twitching on the left side of her mouth lasting a second and intermittent slurred speech.  No unilateral numbness, weakness or headache.  CT head unremarkable.  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  Following this event, she had another episode of speech disturbance, trouble saying the correct word.  Admitted to hospital on 11/15/2021 for another episode of slurred speech and nausea.  Also endorsed neck pain and numbness radiating down left arm.  Left leg seemed slightly weak on exam.  Symptoms lasted a couple of hours.  MRI of brain revealed stable chronic small vessel ischemic changes but no acute stroke.  CTA of head and neck showed moderate focal narrowing of right V2 vertebral artery due to mass effect from degenerative bony spurring but no significant intracranial or extracranial stenosis or occlusion.  2D echocardiogram showed EF 70-75%.  LDL was 28.  She had a recent Hgb A1c in April that was 11.  TIA suspected.  In addition to ASA, she was placed on Plavix for 21 days followed again by ASA monotherapy.  Due to neck pain and reported left sided pain and weakness, she had an MRI of the cervical spine which revealed moderate  spinal stenosis at C5-6 but no severe stenosis or cord abnormality.    Past NSAIDS/steroids:  ibuprofen, prednisone (unable to take0 Past analgesics:  Tylenol, Excedrin Migraine Past abortive triptans:  Sumatriptan tablet, rizatriptan Past muscle relaxants:  no Past anti-emetic:  Zofran Past antihypertensive medications:  no Past antidepressant medications:  sertraline 50mg , nortriptyline 75mg  as needed for sleep (cannot take nightly due to extreme dry mouth), Lexapro 20mg  Past anticonvulsant medications:  topiramate immediate release 100mg  (cognitive problems), Topiramate ER 100 mg (effective), pregablin Past vitamins/Herbal/Supplements:  no Other past therapies:  no   Family history of headache:  cousins   MRI and MRA of head from 03/15/15 were personally reviewed and were unremarkable except for minimal punctate white matter foci.   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  CT cervical spine from 01/09/18 showed cervical spondylosis with degenerative changes greatest at C4-C7 levels with bilateral neural foraminal stenosis, as well as mild multifactorial C5-6 canal stenosis.  She is treated by pain management.  Endorses numbness and tingling in hands.  NCV-EMG of upper extremities in June 2022 revealed mild carpal tunnel syndrome.    Due to ongoing chronic low back pain and intermittent left leg numbness despite physical therapy, she had an MRI of lumbar spine on 05/15/2022, which revealed L4-L5 mild left-greater-than-right neural foraminal narrowing potentially affecting the descending L5 nerve roots.  She was  referred to interventional pain management. Never received a call to schedule for an appointment but has since improved.    PAST MEDICAL HISTORY: Past Medical History:  Diagnosis Date   Allergy    Anemia    history of   Anxiety    Asthma    Depression    Diabetes mellitus    type 2    Fibromyalgia    Gastric ulcer    GERD  (gastroesophageal reflux disease)    Graves disease    H/O therapeutic radiation    Migraine    Multiple thyroid nodules    Myofacial muscle pain    Osteoarthritis    Facet hypertrophy with injections   TIA (transient ischemic attack)    02/2015    MEDICATIONS: Current Outpatient Medications on File Prior to Visit  Medication Sig Dispense Refill   albuterol (PROAIR HFA) 108 (90 Base) MCG/ACT inhaler Inhale 2 puffs into the lungs every 4 (four) hours as needed for wheezing. 1 each 2   albuterol (PROVENTIL) (2.5 MG/3ML) 0.083% nebulizer solution Take 3 mLs by nebulization 4 (four) times daily as needed for wheezing or shortness of breath.   3   aspirin EC 81 MG tablet Take 1 tablet (81 mg total) by mouth daily. Swallow whole. 30 tablet 11   baclofen (LIORESAL) 10 MG tablet Take 1 tablet (10 mg total) by mouth 3 (three) times daily as needed for muscle spasms. 90 each 5   benzonatate (TESSALON) 200 MG capsule Take 200 mg by mouth 3 (three) times daily as needed for cough. (Patient not taking: Reported on 12/11/2022)     Blood Glucose Monitoring Suppl (ONETOUCH VERIO) w/Device KIT Check blood sugar once daily 1 kit 0   DULoxetine (CYMBALTA) 60 MG capsule Take 60 mg by mouth 2 (two) times daily.     empagliflozin (JARDIANCE) 10 MG TABS tablet TAKE 1 TABLET(10 MG) BY MOUTH DAILY WITH BREAKFAST 90 tablet 3   Fluticasone Furoate (ARNUITY ELLIPTA) 100 MCG/ACT AEPB Inhale 1 puff into the lungs daily. 30 each 3   Galcanezumab-gnlm (EMGALITY) 120 MG/ML SOAJ INJECT THE CONTENTS OF 1 PEN  SUBCUTANEOUSLY MONTHLY AS  MAINTENANCE DOSE 1 mL 0   glucose blood (ONETOUCH VERIO) test strip CHECK YOUR BLOOD SUGAR ONCE  DAILY AS DIRECTED (VARY TIME OF  DAY WHEN CHECK, BEFORE 3 MEALS  AND AT BEDTIME) 100 strip 3   HYDROcodone bit-homatropine (HYDROMET) 5-1.5 MG/5ML syrup      HYDROcodone-acetaminophen (NORCO/VICODIN) 5-325 MG tablet Take 1 tablet by mouth every 6 (six) hours as needed for severe pain. 5 tablet 0    insulin glargine, 2 Unit Dial, (TOUJEO MAX SOLOSTAR) 300 UNIT/ML Solostar Pen INJECT SUBCUTANEOUSLY 36 UNITS  EVERY MORNING 12 mL 3   insulin lispro (HUMALOG KWIKPEN) 100 UNIT/ML KwikPen INJECT SUBCUTANEOUSLY 5 - 6  UNITS 3 TIMES DAILY BEFORE MEALS 15 mL 3   Insulin Pen Needle (BD PEN NEEDLE NANO 2ND GEN) 32G X 4 MM MISC USE TO INJECT INSULIN 4 TIMES A DAY 400 each 3   lamoTRIgine (LAMICTAL) 150 MG tablet Take 200 mg by mouth at bedtime.     Lancets (ONETOUCH DELICA PLUS LANCET33G) MISC CHECK BLOOD SUGAR ONCE DAILY 100 each 3   levothyroxine (SYNTHROID) 137 MCG tablet TAKE 1 TABLET BY MOUTH DAILY  BEFORE BREAKFAST 90 tablet 3   linaclotide (LINZESS) 290 MCG CAPS capsule Take 290 mcg by mouth daily before breakfast.      Magnesium 250 MG TABS Take 750 mg by  mouth at bedtime.     mirtazapine (REMERON) 7.5 MG tablet Take 7.5 mg by mouth at bedtime.     montelukast (SINGULAIR) 10 MG tablet Take 10 mg by mouth daily.   3   olmesartan-hydrochlorothiazide (BENICAR HCT) 20-12.5 MG tablet Take 1 tablet by mouth daily.     promethazine (PHENERGAN) 12.5 MG tablet TAKE 1 TABLET BY MOUTH EVERY 6  HOURS AS NEEDED FOR NAUSEA OR  VOMITING 90 tablet 3   tirzepatide (MOUNJARO) 15 MG/0.5ML Pen Inject 15 mg into the skin once a week. 6 mL 4   zonisamide (ZONEGRAN) 50 MG capsule TAKE 1 CAPSULE BY MOUTH DAILY 90 capsule 3   Current Facility-Administered Medications on File Prior to Visit  Medication Dose Route Frequency Provider Last Rate Last Admin   metoCLOPramide (REGLAN) injection 10 mg  10 mg Intramuscular Once Shon Millet R, DO        ALLERGIES: Allergies  Allergen Reactions   Prednisone Other (See Comments)   Gabapentin Other (See Comments)    unknown   Ergotrate [Ergonovine] Other (See Comments)    Pt does not ever want   Ibuprofen Other (See Comments)    Patient has ulcer   Latex Other (See Comments)    Breaks out   Metformin And Related Other (See Comments)    Dizziness   Yutopar [Ritodrine]  Other (See Comments)    Pt does not ever want    FAMILY HISTORY: Family History  Problem Relation Age of Onset   Hypertension Mother    Diabetes Mother    Cancer Mother        rectal   Stroke Father    Heart disease Father 41       MI x5   Hypertension Father    Diabetes Father    Breast cancer Sister        unsure of age , was between 27 and 68   Drug abuse Brother    Alcohol abuse Brother    Drug abuse Brother    Alcohol abuse Brother    Anxiety disorder Neg Hx    Bipolar disorder Neg Hx    Depression Neg Hx       Objective:  Blood pressure (!) 154/83, pulse 81, height 5\' 4"  (1.626 m), weight 187 lb (84.8 kg), last menstrual period 12/06/2016, SpO2 100%. General: No acute distress.  Patient appears well-groomed.   Head:  Normocephalic/atraumatic Eyes:  Fundi examined but not visualized Neck: supple, no paraspinal tenderness, full range of motion Heart:  Regular rate and rhythm Lungs:  Clear to auscultation bilaterally Back: No paraspinal tenderness Neurological Exam: alert and oriented.  Speech fluent and not dysarthric, language intact.  CN II-XII intact. Bulk and tone normal, muscle strength 5/5 throughout.  Sensation to pinprick and vibration intact.  Deep tendon reflexes 3+ in patellars, otherwise 2+ throughout, toes downgoing, Hoffman sign absent.  Finger to nose testing intact.  Gait normal, able to turn and tandem walk.  Romberg negative.   Shon Millet, DO  CC: Warnell Forester, PA-C

## 2023-04-18 ENCOUNTER — Encounter: Payer: Self-pay | Admitting: Neurology

## 2023-04-18 ENCOUNTER — Ambulatory Visit (INDEPENDENT_AMBULATORY_CARE_PROVIDER_SITE_OTHER): Payer: 59 | Admitting: Neurology

## 2023-04-18 VITALS — BP 154/83 | HR 81 | Ht 64.0 in | Wt 187.0 lb

## 2023-04-18 DIAGNOSIS — R296 Repeated falls: Secondary | ICD-10-CM | POA: Diagnosis not present

## 2023-04-18 DIAGNOSIS — G43009 Migraine without aura, not intractable, without status migrainosus: Secondary | ICD-10-CM

## 2023-04-18 DIAGNOSIS — G43409 Hemiplegic migraine, not intractable, without status migrainosus: Secondary | ICD-10-CM

## 2023-04-18 NOTE — Patient Instructions (Signed)
Your exam is unremarkable.  Keep monitoring and be careful when you are on your feet.

## 2023-04-21 ENCOUNTER — Ambulatory Visit (HOSPITAL_COMMUNITY): Payer: 59

## 2023-04-21 ENCOUNTER — Encounter (HOSPITAL_COMMUNITY): Payer: Self-pay

## 2023-04-22 ENCOUNTER — Other Ambulatory Visit: Payer: Self-pay | Admitting: Pulmonary Disease

## 2023-04-23 ENCOUNTER — Telehealth: Payer: Self-pay | Admitting: Neurology

## 2023-04-23 ENCOUNTER — Other Ambulatory Visit (HOSPITAL_COMMUNITY): Payer: Self-pay

## 2023-04-23 NOTE — Telephone Encounter (Signed)
Per test claim PA is not needed, refill is too soon due to last fill 10/17

## 2023-04-23 NOTE — Telephone Encounter (Signed)
Caller stated pharmacy needs up to date PA for [facility]

## 2023-04-24 IMAGING — MR MR HEAD WO/W CM
13 series · 48 of 48 positions shown · IV contrast (multihance)
Comparison: 09/11/2020.  09/26/2017.

CLINICAL DATA: New or worsening headache. Slurred speech. Memory
loss.

EXAM:
MRI HEAD WITHOUT AND WITH CONTRAST
TECHNIQUE: Multiplanar, multiecho pulse sequences of the brain and surrounding
structures were obtained without and with intravenous contrast.
CONTRAST:  20mL MULTIHANCE GADOBENATE DIMEGLUMINE 529 MG/ML IV SOLN

[Series 2: T1 · sagittal · 5.0mm · 0.45mm/px · 2 of 25 slices shown]
[im 1/25]
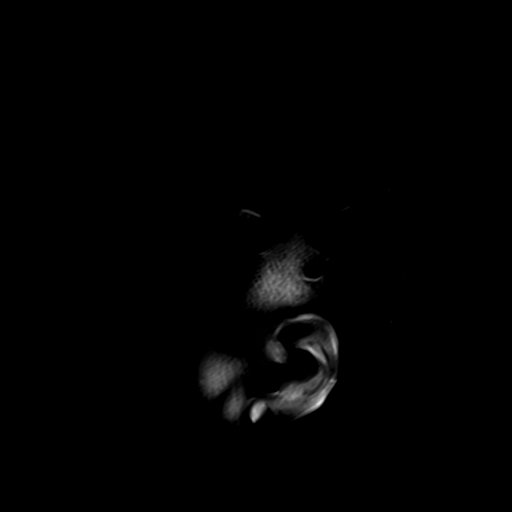
[im 25/25]
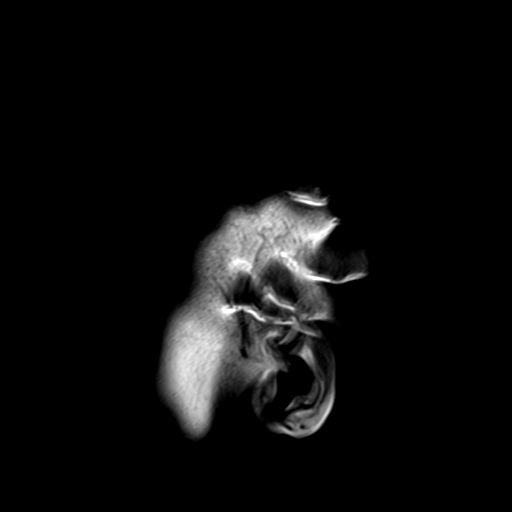

[Series 3: ax ep2d_diff_3 · axial · 3.0mm · 1.80mm/px · z∈[-29,+133]mm · 6 of 102 slices shown]
[im 1/102]
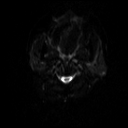
[im 21/102]
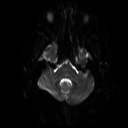
[im 41/102]
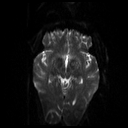
[im 61/102]
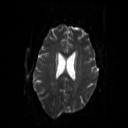
[im 81/102]
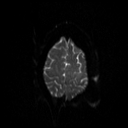
[im 102/102]
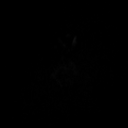

[Series 4: ax ep2d_diff_3_adc · axial · 3.0mm · 1.80mm/px · z∈[-29,+133]mm · 3 of 55 slices shown]
[im 1/55]
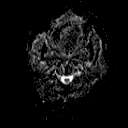
[im 28/55]
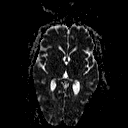
[im 55/55]
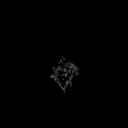

[Series 5: cor ep2d_diff · coronal · 5.0mm · 1.77mm/px · 3 of 60 slices shown]
[im 1/60]
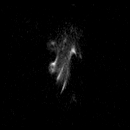
[im 30/60]
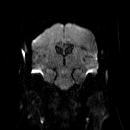
[im 60/60]
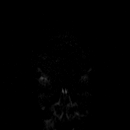

[Series 6: cor ep2d_diff_adc · coronal · 5.0mm · 1.77mm/px · 2 of 30 slices shown]
[im 1/30]
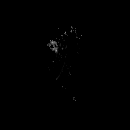
[im 30/30]
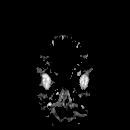

[Series 8: swi_images · axial · 2.0mm · 0.98mm/px · z∈[-28,+130]mm · 5 of 80 slices shown]
[im 1/80]
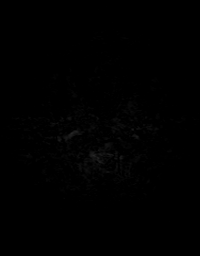
[im 20/80]
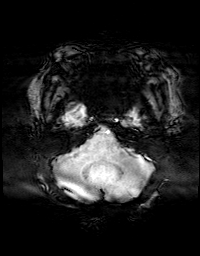
[im 40/80]
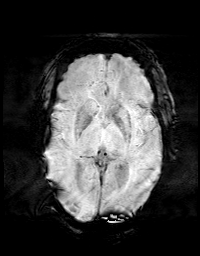
[im 60/80]
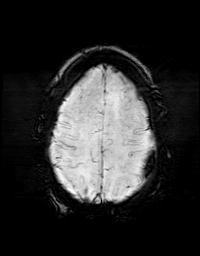
[im 80/80]
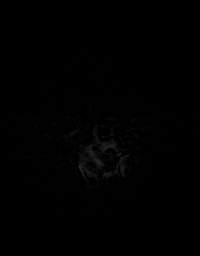

[Series 9: FLAIR · axial · 3.0mm · 0.43mm/px · z∈[-24,+128]mm · 2 of 40 slices shown (1 of 2)]
[im 1/40]
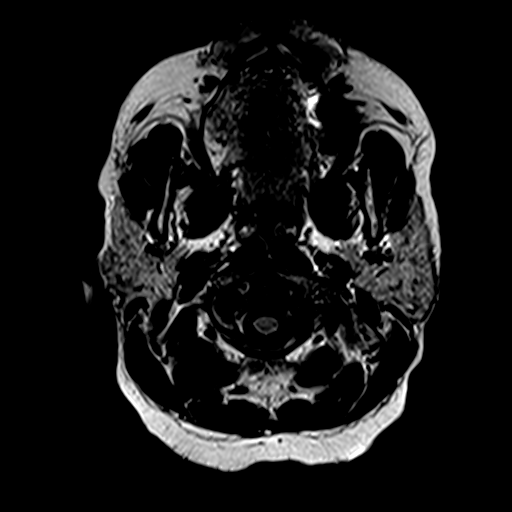
[im 40/40]
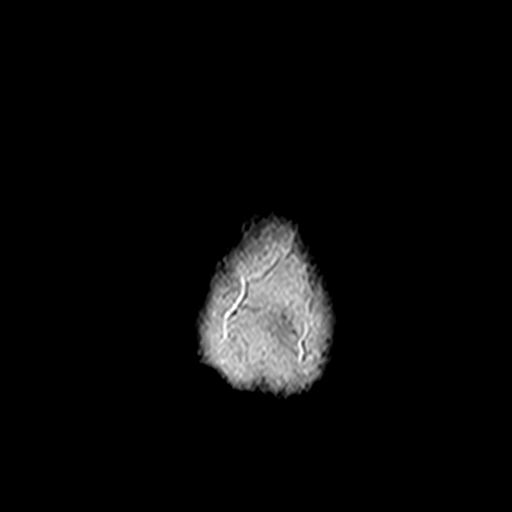

[Series 10: T2 · axial · 5.0mm · 0.65mm/px · z∈[-33,+135]mm · 2 of 29 slices shown (1 of 2)]
[im 1/29]
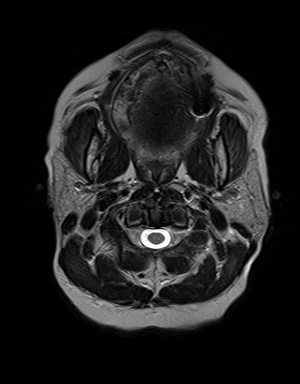
[im 29/29]
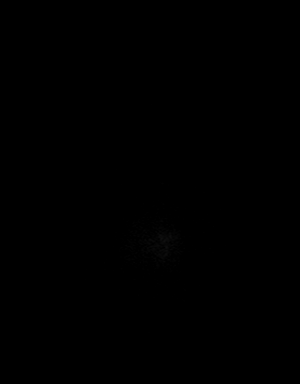

[Series 11: t1_mpr_tra · axial · 1.0mm · 0.72mm/px · z∈[-28,+131]mm · 9 of 160 slices shown]
[im 1/160]
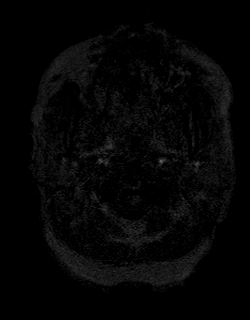
[im 20/160]
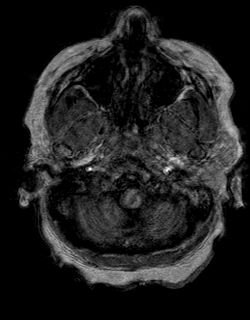
[im 40/160]
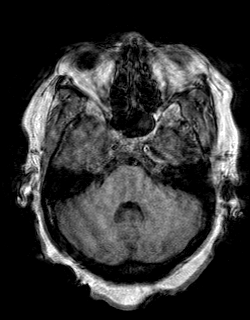
[im 60/160]
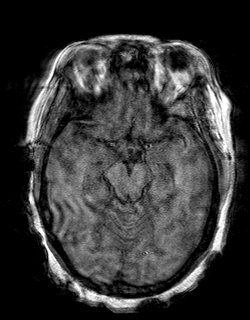
[im 80/160]
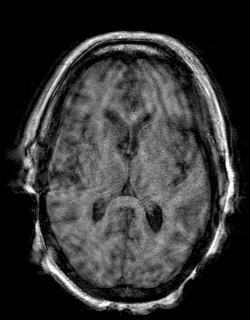
[im 100/160]
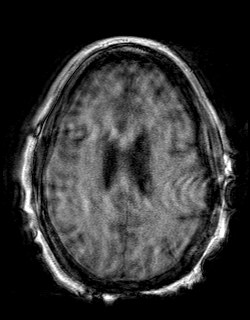
[im 120/160]
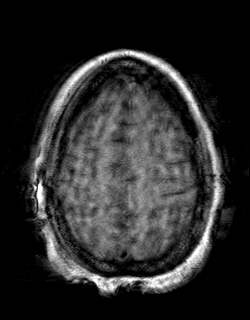
[im 140/160]
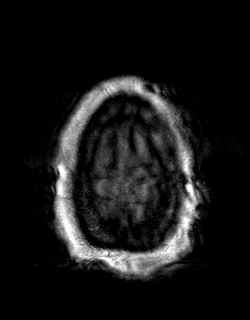
[im 160/160]
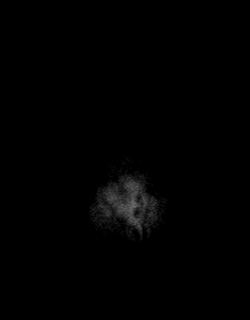

[Series 12: FLAIR · sagittal · 3.0mm · 0.43mm/px · 2 of 40 slices shown (2 of 2)]
[im 1/40]
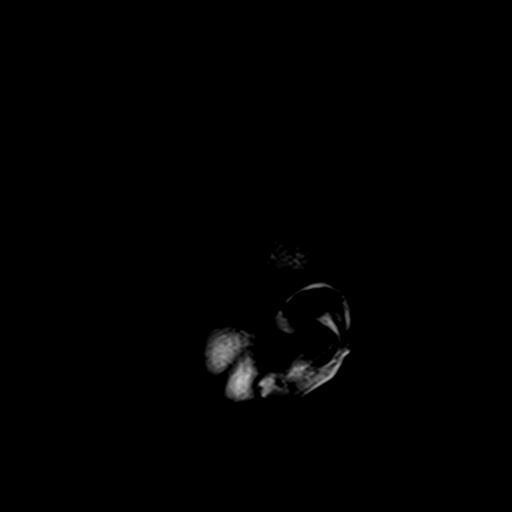
[im 40/40]
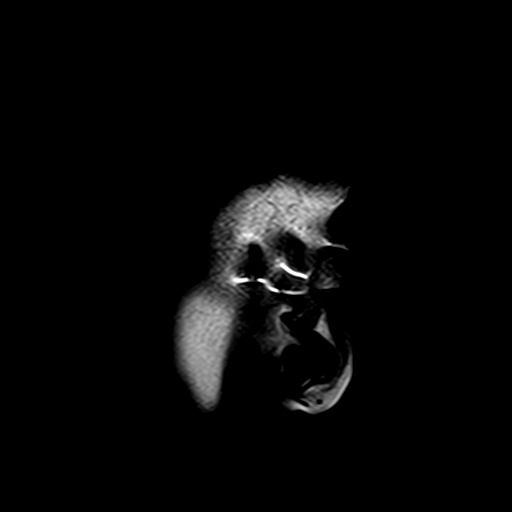

[Series 13: T2 · coronal · 5.0mm · 0.45mm/px · 2 of 31 slices shown (2 of 2)]
[im 1/31]
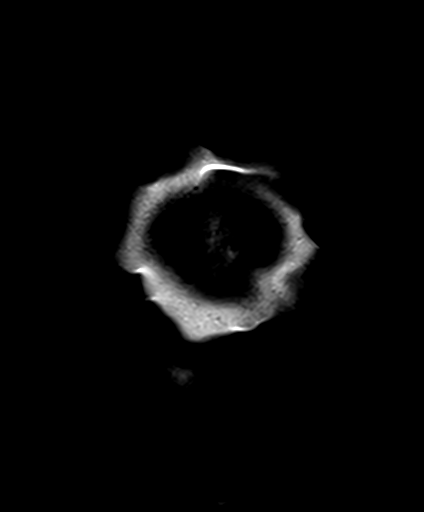
[im 31/31]
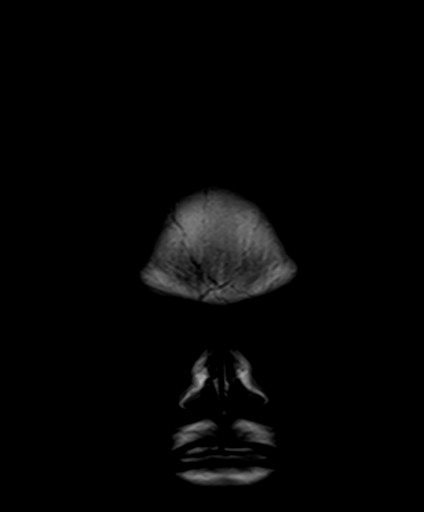

[Series 14: T1 post-contrast · coronal · 5.0mm · 0.72mm/px · 1 of 26 slices shown]
[im 1/26]
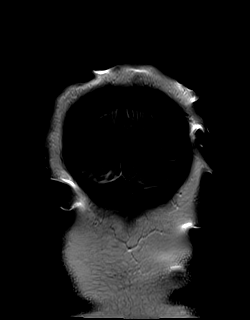

[Series 15: post t1_mpr_tra · axial · 1.0mm · 0.72mm/px · z∈[-28,+131]mm · 9 of 160 slices shown]
[im 1/160]
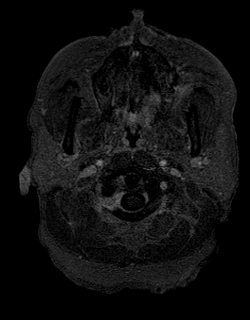
[im 20/160]
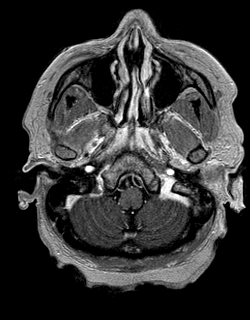
[im 40/160]
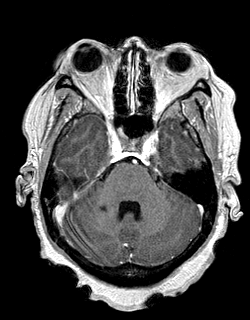
[im 60/160]
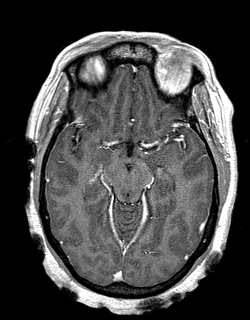
[im 80/160]
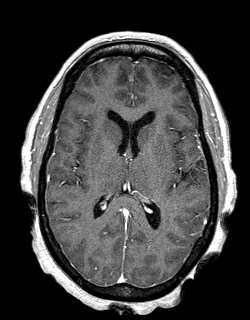
[im 100/160]
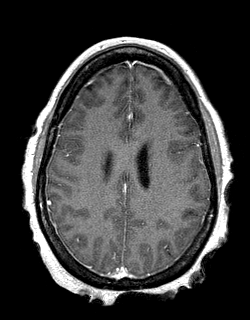
[im 120/160]
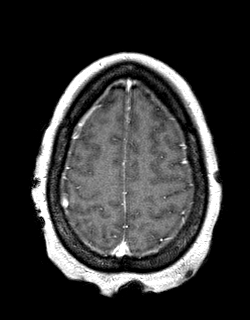
[im 140/160]
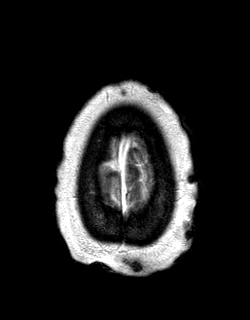
[im 160/160]
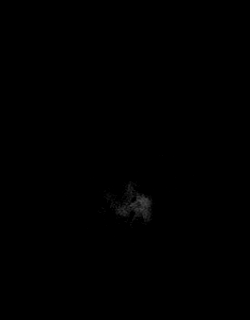

[48 of 48 positions shown; findings below may reference images not displayed]

FINDINGS: Brain: Diffusion imaging does not show any acute or subacute insult.
Chronic small-vessel ischemic changes of the midline pons. Chronic
lesion of the middle cerebellar peduncle on the right. Changes of
moderate chronic small vessel disease of the cerebral hemispheric
deep and subcortical white matter. All these appear unchanged since
[REDACTED]. No cortical or large vessel territory infarction. No mass
lesion, hemorrhage, hydrocephalus or extra-axial collection.

Vascular: Major vessels at the base of the brain show flow.

Skull and upper cervical spine: Negative

Sinuses/Orbits: Clear/normal

Other: None
IMPRESSION: No change seen since [REDACTED]. Chronic small-vessel
ischemic changes throughout the brain as outlined above. One could
consider demyelinating disease, but with a history of diabetes,
small vessel ischemic changes are favored.

## 2023-04-24 MED ORDER — EMGALITY 120 MG/ML ~~LOC~~ SOAJ: SUBCUTANEOUS | 2 refills | Status: DC

## 2023-04-24 NOTE — Telephone Encounter (Signed)
Patient needed a refill sent into the pharmacy.  Refill sent.

## 2023-05-06 ENCOUNTER — Other Ambulatory Visit: Payer: Self-pay

## 2023-05-06 ENCOUNTER — Other Ambulatory Visit: Payer: Self-pay | Admitting: Radiology

## 2023-05-06 DIAGNOSIS — E1165 Type 2 diabetes mellitus with hyperglycemia: Secondary | ICD-10-CM

## 2023-05-06 DIAGNOSIS — R7989 Other specified abnormal findings of blood chemistry: Secondary | ICD-10-CM

## 2023-05-06 MED ORDER — EMPAGLIFLOZIN 10 MG PO TABS: ORAL_TABLET | ORAL | 3 refills | Status: DC

## 2023-05-07 ENCOUNTER — Ambulatory Visit (HOSPITAL_COMMUNITY)
Admission: RE | Admit: 2023-05-07 | Discharge: 2023-05-07 | Disposition: A | Discharge status: Home / Self Care | Payer: 59 | Source: Ambulatory Visit | Attending: Nurse Practitioner | Admitting: Nurse Practitioner

## 2023-05-07 ENCOUNTER — Encounter (HOSPITAL_COMMUNITY): Payer: Self-pay

## 2023-05-07 ENCOUNTER — Other Ambulatory Visit: Payer: Self-pay

## 2023-05-07 DIAGNOSIS — Z923 Personal history of irradiation: Secondary | ICD-10-CM | POA: Diagnosis not present

## 2023-05-07 DIAGNOSIS — E119 Type 2 diabetes mellitus without complications: Secondary | ICD-10-CM | POA: Diagnosis not present

## 2023-05-07 DIAGNOSIS — Z794 Long term (current) use of insulin: Secondary | ICD-10-CM | POA: Insufficient documentation

## 2023-05-07 DIAGNOSIS — Z7984 Long term (current) use of oral hypoglycemic drugs: Secondary | ICD-10-CM | POA: Insufficient documentation

## 2023-05-07 DIAGNOSIS — Z79890 Hormone replacement therapy (postmenopausal): Secondary | ICD-10-CM | POA: Insufficient documentation

## 2023-05-07 DIAGNOSIS — Z7985 Long-term (current) use of injectable non-insulin antidiabetic drugs: Secondary | ICD-10-CM | POA: Insufficient documentation

## 2023-05-07 DIAGNOSIS — E0500 Thyrotoxicosis with diffuse goiter without thyrotoxic crisis or storm: Secondary | ICD-10-CM | POA: Insufficient documentation

## 2023-05-07 DIAGNOSIS — M797 Fibromyalgia: Secondary | ICD-10-CM | POA: Diagnosis not present

## 2023-05-07 DIAGNOSIS — R7989 Other specified abnormal findings of blood chemistry: Secondary | ICD-10-CM | POA: Insufficient documentation

## 2023-05-07 DIAGNOSIS — K219 Gastro-esophageal reflux disease without esophagitis: Secondary | ICD-10-CM | POA: Diagnosis not present

## 2023-05-07 LAB — PROTIME-INR
INR: 1 (ref 0.8–1.2)
Prothrombin Time: 13.7 s (ref 11.4–15.2)

## 2023-05-07 LAB — CBC
HCT: 46.8 % — ABNORMAL HIGH (ref 36.0–46.0)
Hemoglobin: 14.9 g/dL (ref 12.0–15.0)
MCH: 27.3 pg (ref 26.0–34.0)
MCHC: 31.8 g/dL (ref 30.0–36.0)
MCV: 85.9 fL (ref 80.0–100.0)
Platelets: 365 10*3/uL (ref 150–400)
RBC: 5.45 MIL/uL — ABNORMAL HIGH (ref 3.87–5.11)
RDW: 13.8 % (ref 11.5–15.5)
WBC: 8.3 10*3/uL (ref 4.0–10.5)
nRBC: 0 % (ref 0.0–0.2)

## 2023-05-07 LAB — GLUCOSE, CAPILLARY: Glucose-Capillary: 98 mg/dL (ref 70–99)

## 2023-05-07 MED ORDER — LIDOCAINE HCL (PF) 1 % IJ SOLN
10.0000 mL | Freq: Once | INTRAMUSCULAR | Status: AC
  Administered 2023-05-07: 10 mL via INTRADERMAL

## 2023-05-07 MED ORDER — MIDAZOLAM HCL 2 MG/2ML IJ SOLN
INTRAMUSCULAR | Status: AC
  Filled 2023-05-07: qty 2

## 2023-05-07 MED ORDER — OXYCODONE HCL 5 MG PO TABS
10.0000 mg | ORAL_TABLET | Freq: Four times a day (QID) | ORAL | Status: DC | PRN
  Administered 2023-05-07: 10 mg via ORAL
  Filled 2023-05-07: qty 2

## 2023-05-07 MED ORDER — SODIUM CHLORIDE 0.9% FLUSH: 10.0000 mL | Freq: Two times a day (BID) | INTRAVENOUS | Status: DC

## 2023-05-07 MED ORDER — MIDAZOLAM HCL 2 MG/2ML IJ SOLN
INTRAMUSCULAR | Status: AC | PRN
  Administered 2023-05-07 (×2): 1 mg via INTRAVENOUS

## 2023-05-07 MED ORDER — GELATIN ABSORBABLE 12-7 MM EX MISC
1.0000 | Freq: Once | CUTANEOUS | Status: AC
  Administered 2023-05-07: 1 via TOPICAL

## 2023-05-07 MED ORDER — FENTANYL CITRATE (PF) 100 MCG/2ML IJ SOLN
INTRAMUSCULAR | Status: AC | PRN
  Administered 2023-05-07 (×2): 50 ug via INTRAVENOUS

## 2023-05-07 MED ORDER — FENTANYL CITRATE (PF) 100 MCG/2ML IJ SOLN
INTRAMUSCULAR | Status: AC
  Filled 2023-05-07: qty 2

## 2023-05-07 NOTE — Procedures (Signed)
Vascular and Interventional Radiology Procedure Note  Patient: Kendra Hall DOB: [id number] Medical Record Number: [id number] Note Date/Time: 05/07/23 12:56 PM   Performing Physician: Roanna Banning, MD Assistant(s): None  Diagnosis: >LFTs   Procedure: LIVER BIOPSY, NON-TARGETED  Anesthesia: Conscious Sedation Complications: None Estimated Blood Loss: Minimal Specimens: Sent for Pathology  Findings:  Successful Ultrasound-guided biopsy of liver. A total of 3 samples were obtained. Hemostasis of the tract was achieved using Gelfoam Slurry Embolization.  Plan: Bed rest for 2 hours.  See detailed procedure note with images in PACS. The patient tolerated the procedure well without incident or complication and was returned to Recovery in stable condition.    Roanna Banning, MD [facility] Rad[facility]   Pager. [phone] Clinic. [phone]

## 2023-05-07 NOTE — H&P (Signed)
Chief Complaint: Patient was seen in consultation today for elevated LFTs and random liver biopsy at the request of Drazek,Dawn  Supervising Physician: Gilmer Mor  Patient Status: [facility] - Out-pt  History of Present Illness: Kendra Hall is a 57 y.o. female   Patient initially presented with knee swelling at [facility], who referred patient to Hem/Onc service due to abnormal labs. Patient was then referred to Hepatology service as weel for abnormal LFTs.  Patient is now followed by Cresenciano Genre, NP, who requests a diagnostic random liver biopsy by IR. Patient is scheduled for same today.    Past Medical History:  Diagnosis Date   Allergy    Anemia    history of   Anxiety    Asthma    Depression    Diabetes mellitus    type 2    Fibromyalgia    Gastric ulcer    GERD (gastroesophageal reflux disease)    Graves disease    H/O therapeutic radiation    Migraine    Multiple thyroid nodules    Myofacial muscle pain    Osteoarthritis    Facet hypertrophy with injections   TIA (transient ischemic attack)    02/2015    Past Surgical History:  Procedure Laterality Date   AXILLARY LYMPH NODE DISSECTION  2001   CHOLECYSTECTOMY N/A 09/03/2018   Procedure: LAPAROSCOPIC CHOLECYSTECTOMY;  Surgeon: Abigail Miyamoto, MD;  Location: [facility];  Service: General;  Laterality: N/A;   HYDRADENITIS EXCISION     TUBAL LIGATION  1995   VAGINAL HYSTERECTOMY Left 03/11/2017   Procedure: HYSTERECTOMY VAGINAL W/LEFT PROXIMAL SALPINGECTOMY;  Surgeon: Genia Del, MD;  Location: [facility];  Service: Gynecology;  Laterality: Left;  request 7:30am OR time  request 2 hours      Allergies: Prednisone, Gabapentin, Ergotrate [ergonovine], Ibuprofen, Latex, Metformin and related, and Yutopar [ritodrine]  Medications: Prior to Admission medications   Medication Sig Start Date End Date Taking? Authorizing Provider  albuterol (PROAIR HFA) 108 (90 Base) MCG/ACT inhaler Inhale 2 puffs into  the lungs every 4 (four) hours as needed for wheezing. 10/16/22   Virl Diamond A, MD  albuterol (PROVENTIL) (2.5 MG/3ML) 0.083% nebulizer solution Take 3 mLs by nebulization 4 (four) times daily as needed for wheezing or shortness of breath.  03/02/18   [provider]  ARNUITY ELLIPTA 100 MCG/ACT AEPB USE 1 INHALATION BY MOUTH DAILY 04/24/23   Virl Diamond A, MD  aspirin EC 81 MG tablet Take 1 tablet (81 mg total) by mouth daily. Swallow whole. 11/17/21   Leroy Sea, MD  baclofen (LIORESAL) 10 MG tablet Take 1 tablet (10 mg total) by mouth 3 (three) times daily as needed for muscle spasms. 09/26/22   Drema Dallas, DO  benzonatate (TESSALON) 200 MG capsule Take 200 mg by mouth 3 (three) times daily as needed for cough. 12/29/20   [provider]  Blood Glucose Monitoring Suppl (ONETOUCH VERIO) w/Device KIT Check blood sugar once daily 12/17/21   Reather Littler, MD  DULoxetine (CYMBALTA) 60 MG capsule Take 60 mg by mouth 2 (two) times daily. 08/09/19   [provider]  empagliflozin (JARDIANCE) 10 MG TABS tablet TAKE 1 TABLET(10 MG) BY MOUTH DAILY WITH BREAKFAST 05/06/23   Thapa, [place], MD  Galcanezumab-gnlm [facility]) 120 MG/ML SOAJ INJECT THE CONTENTS OF 1 PEN  SUBCUTANEOUSLY MONTHLY AS  MAINTENANCE DOSE 04/24/23   Everlena Cooper, Adam R, DO  glucose blood (ONETOUCH VERIO) test strip CHECK YOUR BLOOD SUGAR ONCE  DAILY AS  DIRECTED (VARY TIME OF  DAY WHEN CHECK, BEFORE 3 MEALS  AND AT BEDTIME) 07/12/22   Reather Littler, MD  HYDROcodone bit-homatropine (HYDROMET) 5-1.5 MG/5ML syrup     [provider]  HYDROcodone-acetaminophen (NORCO/VICODIN) 5-325 MG tablet Take 1 tablet by mouth every 6 (six) hours as needed for severe pain. 01/29/23   Smoot, Sarah A, PA-C  insulin glargine, 2 Unit Dial, (TOUJEO MAX SOLOSTAR) 300 UNIT/ML Solostar Pen INJECT SUBCUTANEOUSLY 36 UNITS  EVERY MORNING 03/05/23   Thapa, [place], MD  Insulin Pen Needle (BD PEN NEEDLE NANO 2ND GEN) 32G X 4 MM MISC USE TO  INJECT INSULIN 4 TIMES A DAY 07/12/22   Reather Littler, MD  lamoTRIgine (LAMICTAL) 150 MG tablet Take 200 mg by mouth at bedtime. 11/06/21   [provider]  Lancets (ONETOUCH DELICA PLUS [place]) MISC CHECK BLOOD SUGAR ONCE DAILY 05/29/22   Reather Littler, MD  levothyroxine (SYNTHROID) 137 MCG tablet TAKE 1 TABLET BY MOUTH DAILY  BEFORE BREAKFAST 09/23/22   Reather Littler, MD  linaclotide [facility]) 290 MCG CAPS capsule Take 290 mcg by mouth daily before breakfast.     [provider]  Magnesium 250 MG TABS Take 750 mg by mouth at bedtime.    [provider]  mirtazapine (REMERON) 7.5 MG tablet Take 7.5 mg by mouth at bedtime. 01/21/21   [provider]  montelukast (SINGULAIR) 10 MG tablet Take 10 mg by mouth daily.  03/16/18   [provider]  olmesartan-hydrochlorothiazide (BENICAR HCT) 20-12.5 MG tablet Take 1 tablet by mouth daily. 10/05/22   [provider]  promethazine (PHENERGAN) 12.5 MG tablet TAKE 1 TABLET BY MOUTH EVERY 6  HOURS AS NEEDED FOR NAUSEA OR  VOMITING 06/12/22   Everlena Cooper, Adam R, DO  tirzepatide [facility]) 15 MG/0.5ML Pen Inject 15 mg into the skin once a week. 03/05/23   Thapa, [place], MD  zonisamide (ZONEGRAN) 50 MG capsule TAKE 1 CAPSULE BY MOUTH DAILY 03/04/23   Drema Dallas, DO     Family History  Problem Relation Age of Onset   Hypertension Mother    Diabetes Mother    Cancer Mother        rectal   Stroke Father    Heart disease Father 33       MI x5   Hypertension Father    Diabetes Father    Breast cancer Sister        unsure of age , was between 59 and 66   Drug abuse Brother    Alcohol abuse Brother    Drug abuse Brother    Alcohol abuse Brother    Anxiety disorder Neg Hx    Bipolar disorder Neg Hx    Depression Neg Hx     Social History   Socioeconomic History   Marital status: Single    Spouse name: Not on file   Number of children: 4   Years of education: 14   Highest education level: Not on file   Occupational History   Occupation: customer service    Employer: [employer]  Tobacco Use   Smoking status: Every Day    Current packs/day: 0.50    Average packs/day: 0.5 packs/day for 41.0 years (20.5 ttl pk-yrs)    Types: Cigarettes   Smokeless tobacco: Never  Vaping Use   Vaping status: Former  Substance and Sexual Activity   Alcohol use: No   Drug use: Not Currently    Comment: 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  Today denies all drug abuse   Sexual activity: Not Currently    Birth control/protection: Surgical  Other Topics Concern   Not on file  Social History Narrative   Lives alone in [place]. Her daugther occassoinally comes and stay with her. Pt is working Clinical biochemist at [employer]. Enjoys reading and pt has an associates in applied sciences.  originally from [place],  [place].  Raised by mom and her sister who is 8 yrs older than pt. Pt has several half siblings. No longer on disability. Pt has 4 kids, never married.    Right handed,    Lives with daughter story   Caffeine 2 cups every other day         Social Determinants of Health   Financial Resource Strain: Low Risk  (10/10/2022)   Received from [facility], [facility]   Overall Financial Resource Strain (CARDIA)    Difficulty of Paying Living Expenses: Not very hard  Food Insecurity: Low Risk  (03/19/2023)   Received from [facility]   Hunger Vital Sign    Worried About Running Out of Food in the Last Year: Never true    Ran Out of Food in the Last Year: Never true  Transportation Needs: No Transportation Needs (03/19/2023)   Received from [facility]    In the past 12 months, has lack of reliable transportation kept you from medical appointments, meetings, work or from getting things needed for daily living? : No  Physical Activity: Insufficiently Active (10/10/2022)   Received [facility]   Exercise Vital Sign    Days of Exercise per Week: 3 days    Minutes of Exercise per Session: 30 min  Stress: Stress Concern Present (10/10/2022)   Received from [facility], [facility] - Occupational Stress Questionnaire    Feeling of Stress : Very much  Social Connections: Socially Integrated (10/10/2022)   Received from [facility]   Social Network    How would you rate your social network (family, work, friends)?: Good participation with social networks    Review of Systems: A 12 point ROS discussed and pertinent positives are indicated in the HPI above.  All other systems are negative.  Review of Systems  Constitutional:  Negative for activity change, fatigue and fever.  Respiratory:  Negative for chest tightness, shortness of breath and wheezing.   Cardiovascular:  Negative for chest pain.  Skin:  Negative for color change.  Psychiatric/Behavioral:  Negative for behavioral problems and confusion.     Vital Signs: BP 131/63   Pulse 76   Temp 97.7 F (36.5 C) (Oral)   Resp 18   Ht 5\' 4"  (1.626 m)   Wt 181 lb (82.1 kg)   LMP 12/06/2016 (Approximate)   SpO2 98%   BMI 31.07 kg/m     Physical Exam Constitutional:      General: She is not in acute distress.    Appearance: Normal appearance.  HENT:     Mouth/Throat:     Mouth: Mucous membranes are moist.  Cardiovascular:     Rate and Rhythm: Normal rate and regular rhythm.     Heart sounds: No murmur heard. Pulmonary:     Effort: Pulmonary effort is normal.     Breath sounds: Normal breath sounds. No wheezing.  Skin:    General:  Skin is warm and dry.  Neurological:     Mental Status: She is alert and oriented to person, place, and time.  Psychiatric:        Mood and Affect: Mood normal.        Behavior: Behavior normal.        Thought Content: Thought content normal.        Judgment: Judgment normal.     Imaging: No results  found.  Labs:  CBC: Recent Labs    01/02/23 1838  WBC 7.8  HGB 14.8  HCT 45.9  PLT 285    COAGS: No results for input(s): "INR", "APTT" in the last 8760 hours.  BMP: Recent Labs    05/27/22 1244 08/30/22 1539 12/06/22 1243 01/02/23 2105  NA 137 141 140 136  K 3.7 4.3 4.3 3.8  CL 100 102 102 101  CO2 26 23 28 26   GLUCOSE 157* 106* 114* 73  BUN 15 15 21 18   CALCIUM 9.6 9.6 9.7 8.6*  CREATININE 1.21* 1.16* 1.03 0.95  GFRNONAA  --   --   --  >60    LIVER FUNCTION TESTS: Recent Labs    01/02/23 2105  BILITOT 0.3  AST 31  ALT 53*  ALKPHOS 192*  PROT 6.8  ALBUMIN 3.4*    TUMOR MARKERS: No results for input(s): "AFPTM", "CEA", "CA199", "CHROMGRNA" in the last 8760 hours.  Assessment and Plan:  Patient presents for random liver biopsy in IR today. Risks and benefits of random liver biopsy was discussed with the patient and/or patient's family including, but not limited to bleeding, infection, damage to adjacent structures or low yield requiring additional tests.  All of the questions were answered and there is agreement to proceed.  Consent signed and in chart.   Thank you for this interesting consult.  I greatly enjoyed meeting CORNELIUS DIANE and look forward to participating in their care.  A copy of this report was sent to the requesting provider on this date.  Electronically Signed: Sable Feil, PA-C 05/07/2023, 11:40 AM   I spent a total of  30 Minutes   in face to face in clinical consultation, greater than 50% of which was counseling/coordinating care for random liver biopsy.

## 2023-05-12 LAB — SURGICAL PATHOLOGY

## 2023-05-16 ENCOUNTER — Ambulatory Visit: Payer: 59 | Admitting: Neurology

## 2023-05-21 ENCOUNTER — Other Ambulatory Visit: Payer: Self-pay | Admitting: Nurse Practitioner

## 2023-05-21 DIAGNOSIS — R748 Abnormal levels of other serum enzymes: Secondary | ICD-10-CM

## 2023-06-05 ENCOUNTER — Encounter: Payer: Self-pay | Admitting: Endocrinology

## 2023-06-05 ENCOUNTER — Ambulatory Visit: Payer: 59 | Admitting: Endocrinology

## 2023-06-05 VITALS — BP 140/88 | HR 81 | Resp 20 | Ht 64.0 in | Wt 183.4 lb

## 2023-06-05 DIAGNOSIS — E119 Type 2 diabetes mellitus without complications: Secondary | ICD-10-CM

## 2023-06-05 DIAGNOSIS — Z794 Long term (current) use of insulin: Secondary | ICD-10-CM

## 2023-06-05 DIAGNOSIS — E890 Postprocedural hypothyroidism: Secondary | ICD-10-CM | POA: Diagnosis not present

## 2023-06-05 DIAGNOSIS — E1165 Type 2 diabetes mellitus with hyperglycemia: Secondary | ICD-10-CM

## 2023-06-05 LAB — POCT GLYCOSYLATED HEMOGLOBIN (HGB A1C): Hemoglobin A1C: 6.2 % — AB (ref 4.0–5.6)

## 2023-06-05 MED ORDER — BLOOD GLUCOSE MONITORING SUPPL DEVI: 1.0000 | Freq: Three times a day (TID) | 0 refills | Status: AC

## 2023-06-05 MED ORDER — BLOOD GLUCOSE TEST VI STRP: 1.0000 | ORAL_STRIP | Freq: Three times a day (TID) | 3 refills | Status: AC

## 2023-06-05 MED ORDER — LANCETS MISC. MISC: 1.0000 | Freq: Three times a day (TID) | 3 refills | Status: AC

## 2023-06-05 MED ORDER — LEVOTHYROXINE SODIUM 137 MCG PO TABS: 137.0000 ug | ORAL_TABLET | Freq: Every day | ORAL | 3 refills | Status: AC

## 2023-06-05 MED ORDER — TIRZEPATIDE 15 MG/0.5ML ~~LOC~~ SOAJ: 15.0000 mg | SUBCUTANEOUS | 4 refills | Status: AC

## 2023-06-05 MED ORDER — LANCET DEVICE MISC: 1.0000 | Freq: Three times a day (TID) | 0 refills | Status: AC

## 2023-06-05 MED ORDER — EMPAGLIFLOZIN 10 MG PO TABS: ORAL_TABLET | ORAL | 3 refills | Status: AC

## 2023-06-05 NOTE — Progress Notes (Addendum)
Outpatient Endocrinology Note [place] Helen Cuff, MD  06/10/23  Patient's Name: Kendra Hall    DOB: 1966/05/14    MRN: [id number]                                                    REASON OF VISIT: Follow-up of type 2 diabetes mellitus  PCP: Barnie Mort, PA-C  HISTORY OF PRESENT ILLNESS:   Kendra Hall is a 57 y.o. old female with past medical history listed below, is here for follow up of type 2 diabetes mellitus / postablative hypothyroidism.   Pertinent Diabetes History: Patient was diagnosed with type 2 diabetes mellitus in 2012.  She used to be on oral and non-insulin injectable medication in the past.  Insulin therapy was started in 2020.  Chronic Diabetes Complications : Retinopathy: no. Last ophthalmology exam was done on annually beginning of 2024, ?  March, no records available.  Nephropathy: no, on olmesartan. Peripheral neuropathy: no Coronary artery disease: no Stroke: no  Relevant comorbidities and cardiovascular risk factors: Obesity: yes Body mass index is 31.48 kg/m.  Hypertension: yes Hyperlipidemia. Yes, on statin  Current / Home Diabetic regimen includes:  Jardiance 10 mg daily. Mounjaro 15 mg weekly.  Prior diabetic medications: Trulicity, Invokana, glimepiride, Prandin, Victoza in the past.  Toujeo and Humalog in the past.  Patient reports she had stopped all forms of insulin in July 2024.  Glycemic data:  CONTINUOUS GLUCOSE MONITORING SYSTEM (CGMS) INTERPRETATION:              FreeStyle Libre 3+  CGM-  Sensor Download (Sensor download was reviewed and summarized below.) Dates: November 29 to June 05, 2023, 14 days    Interpretation: Almost all blood sugar are acceptable with the blood sugar every is 115 and CGM uses 98% of the time.  No concerning hypoglycemia.  She had a rare blood sugar in 60s, ?  Sensor issue.  Today starting around 11 AM her blood sugar is in 60s persistently she does not feel hypoglycemic symptoms.She ate sandwich,  Pepsi around 11 AM today still blood sugar in 60s and 50s by the time she is in the clinic today no hypoglycemic symptoms, seems to be sensor issue.   Hypoglycemia: Patient has no hypoglycemic episodes. Patient has hypoglycemia awareness.  Factors modifying glucose control: 1.  Diabetic diet assessment: 3 meals a day.  2.  Staying active or exercising: Walking 3 to 4 days in a week.  3.  Medication compliance: compliant all of the time.  # Postablative hypothyroidism : -She has history of Graves' disease status post RAI ablation I-131 in 2012.  She is currently on thyroid hormone replacement/levothyroxine 137 mcg daily.  Interval history CGM data as reviewed above.  Patient reports she has not been taking any form of insulin since July 2024.  She is only taking [place] and Jardiance.  She has been taking levothyroxine 137 mcg daily.  No hypo and hyperthyroid symptoms.  No other complaints today.  Fingerstick blood sugar in the clinic 110.  Her CGM is reading 55.  REVIEW OF SYSTEMS As per history of present illness.   PAST MEDICAL HISTORY: Past Medical History:  Diagnosis Date   Allergy    Anemia    history of   Anxiety    Asthma    Depression  Diabetes mellitus    type 2    Fibromyalgia    Gastric ulcer    GERD (gastroesophageal reflux disease)    Graves disease    H/O therapeutic radiation    Migraine    Multiple thyroid nodules    Myofacial muscle pain    Osteoarthritis    Facet hypertrophy with injections   TIA (transient ischemic attack)    02/2015    PAST SURGICAL HISTORY: Past Surgical History:  Procedure Laterality Date   AXILLARY LYMPH NODE DISSECTION  2001   CHOLECYSTECTOMY N/A 09/03/2018   Procedure: LAPAROSCOPIC CHOLECYSTECTOMY;  Surgeon: Abigail Miyamoto, MD;  Location: [facility];  Service: General;  Laterality: N/A;   HYDRADENITIS EXCISION     TUBAL LIGATION  1995   VAGINAL HYSTERECTOMY Left 03/11/2017   Procedure: HYSTERECTOMY VAGINAL W/LEFT  PROXIMAL SALPINGECTOMY;  Surgeon: Genia Del, MD;  Location: [facility];  Service: Gynecology;  Laterality: Left;  request 7:30am OR time  request 2 hours      ALLERGIES: Allergies  Allergen Reactions   Prednisone Other (See Comments)   Gabapentin Other (See Comments)    unknown   Ergotrate [Ergonovine] Other (See Comments)    Pt does not ever want   Ibuprofen Other (See Comments)    Patient has ulcer   Latex Other (See Comments)    Breaks out   Metformin And Related Other (See Comments)    Dizziness   Yutopar [Ritodrine] Other (See Comments)    Pt does not ever want    FAMILY HISTORY:  Family History  Problem Relation Age of Onset   Hypertension Mother    Diabetes Mother    Cancer Mother        rectal   Stroke Father    Heart disease Father 32       MI x5   Hypertension Father    Diabetes Father    Breast cancer Sister        unsure of age , was between 54 and 75   Drug abuse Brother    Alcohol abuse Brother    Drug abuse Brother    Alcohol abuse Brother    Anxiety disorder Neg Hx    Bipolar disorder Neg Hx    Depression Neg Hx     SOCIAL HISTORY: Social History   Socioeconomic History   Marital status: Single    Spouse name: Not on file   Number of children: 4   Years of education: 14   Highest education level: Not on file  Occupational History   Occupation: customer service    Employer: [employer]  Tobacco Use   Smoking status: Every Day    Current packs/day: 0.50    Average packs/day: 0.5 packs/day for 41.0 years (20.5 ttl pk-yrs)    Types: Cigarettes   Smokeless tobacco: Never  Vaping Use   Vaping status: Former  Substance and Sexual Activity   Alcohol use: No   Drug use: Not Currently    Comment: 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  Today denies all drug abuse   Sexual activity: Not Currently    Birth control/protection: Surgical  Other Topics Concern   Not on file   Social History Narrative   Lives alone in [place]. Her daugther occassoinally comes and stay with her. Pt is working Clinical biochemist at [employer]. Enjoys reading and pt has an associates in applied sciences.  originally  from [place],  [place].  Raised by mom and her sister who is 8 yrs older than pt. Pt has several half siblings. No longer on disability. Pt has 4 kids, never married.    Right handed,    Lives with daughter story   Caffeine 2 cups every other day         Social Drivers of Health   Financial Resource Strain: Low Risk  (10/10/2022)   Received from [facility], [facility]   Overall Financial Resource Strain (CARDIA)    Difficulty of Paying Living Expenses: Not very hard  Food Insecurity: Low Risk  (03/19/2023)   Received from [facility]   Hunger Vital Sign    Worried About Running Out of Food in the Last Year: Never true    Ran Out of Food in the Last Year: Never true  Transportation Needs: No Transportation Needs (03/19/2023)   Received from [facility]    In the past 12 months, has lack of reliable transportation kept you from medical appointments, meetings, work or from getting things needed for daily living? : No  Physical Activity: Insufficiently Active (10/10/2022)   Received from [facility], [facility]   Exercise Vital Sign    Days of Exercise per Week: 3 days    Minutes of Exercise per Session: 30 min  Stress: Stress Concern Present (10/10/2022)   Received from [facility], [facility] - Occupational Stress Questionnaire    Feeling of Stress : Very much  Social Connections: Socially Integrated (10/10/2022)   [facility] from [facility], [facility]   Social Network    How would you rate your social network (family, work, friends)?: Good participation with social networks    MEDICATIONS:  Current Outpatient Medications  Medication Sig Dispense Refill   albuterol  (PROAIR HFA) 108 (90 Base) MCG/ACT inhaler Inhale 2 puffs into the lungs every 4 (four) hours as needed for wheezing. 1 each 2   albuterol (PROVENTIL) (2.5 MG/3ML) 0.083% nebulizer solution Take 3 mLs by nebulization 4 (four) times daily as needed for wheezing or shortness of breath.   3   ARNUITY ELLIPTA 100 MCG/ACT AEPB USE 1 INHALATION BY MOUTH DAILY 90 each 3   aspirin EC 81 MG tablet Take 1 tablet (81 mg total) by mouth daily. Swallow whole. 30 tablet 11   atorvastatin (LIPITOR) 10 MG tablet Take 10 mg by mouth daily.     baclofen (LIORESAL) 10 MG tablet Take 1 tablet (10 mg total) by mouth 3 (three) times daily as needed for muscle spasms. 90 each 5   benzonatate (TESSALON) 200 MG capsule Take 200 mg by mouth 3 (three) times daily as needed for cough.     Blood Glucose Monitoring Suppl (ONETOUCH VERIO) w/Device KIT Check blood sugar once daily 1 kit 0   Blood Glucose Monitoring Suppl DEVI 1 each by Does not apply route in the morning, at noon, and at bedtime. May substitute to any manufacturer covered by patient's insurance. 1 each 0   DULoxetine (CYMBALTA) 60 MG capsule Take 60 mg by mouth 2 (two) times daily.     fexofenadine (ALLEGRA ODT) 30 MG disintegrating tablet Take 30 mg by mouth daily.     Galcanezumab-gnlm (EMGALITY) 120 MG/ML SOAJ INJECT THE CONTENTS OF 1 PEN  SUBCUTANEOUSLY MONTHLY AS  MAINTENANCE DOSE 3 mL 2   Glucose Blood (BLOOD GLUCOSE TEST STRIPS) STRP 1 each by In Vitro route in the morning,  at noon, and at bedtime. May substitute to any manufacturer covered by patient's insurance. 100 each 3   glucose blood (ONETOUCH VERIO) test strip CHECK YOUR BLOOD SUGAR ONCE  DAILY AS DIRECTED (VARY TIME OF  DAY WHEN CHECK, BEFORE 3 MEALS  AND AT BEDTIME) 100 strip 3   HYDROcodone bit-homatropine (HYDROMET) 5-1.5 MG/5ML syrup      HYDROcodone-acetaminophen (NORCO/VICODIN) 5-325 MG tablet Take 1 tablet by mouth every 6 (six) hours as needed for severe pain. 5 tablet 0   Insulin Pen Needle  (BD PEN NEEDLE NANO 2ND GEN) 32G X 4 MM MISC USE TO INJECT INSULIN 4 TIMES A DAY 400 each 3   lamoTRIgine (LAMICTAL) 150 MG tablet Take 200 mg by mouth at bedtime.     Lancet Device MISC 1 each by Does not apply route in the morning, at noon, and at bedtime. May substitute to any manufacturer covered by patient's insurance. 1 each 0   Lancets (ONETOUCH DELICA PLUS LANCET33G) MISC CHECK BLOOD SUGAR ONCE DAILY 100 each 3   Lancets Misc. MISC 1 each by Does not apply route in the morning, at noon, and at bedtime. May substitute to any manufacturer covered by patient's insurance. 100 each 3   linaclotide (LINZESS) 290 MCG CAPS capsule Take 290 mcg by mouth daily before breakfast.      Magnesium 250 MG TABS Take 750 mg by mouth at bedtime.     mirtazapine (REMERON) 7.5 MG tablet Take 7.5 mg by mouth at bedtime.     montelukast (SINGULAIR) 10 MG tablet Take 10 mg by mouth daily.   3   olmesartan-hydrochlorothiazide (BENICAR HCT) 20-12.5 MG tablet Take 1 tablet by mouth daily.     omeprazole (PRILOSEC OTC) 20 MG tablet Take 20 mg by mouth daily.     promethazine (PHENERGAN) 12.5 MG tablet TAKE 1 TABLET BY MOUTH EVERY 6  HOURS AS NEEDED FOR NAUSEA OR  VOMITING 90 tablet 3   zonisamide (ZONEGRAN) 50 MG capsule TAKE 1 CAPSULE BY MOUTH DAILY 90 capsule 3   empagliflozin (JARDIANCE) 10 MG TABS tablet TAKE 1 TABLET(10 MG) BY MOUTH DAILY WITH BREAKFAST 90 tablet 3   levothyroxine (SYNTHROID) 137 MCG tablet Take 1 tablet (137 mcg total) by mouth daily before breakfast. 90 tablet 3   tirzepatide (MOUNJARO) 15 MG/0.5ML Pen Inject 15 mg into the skin once a week. 6 mL 4   Current Facility-Administered Medications  Medication Dose Route Frequency Provider Last Rate Last Admin   metoCLOPramide (REGLAN) injection 10 mg  10 mg Intramuscular Once Jaffe, Adam R, DO        PHYSICAL EXAM: Vitals:   06/05/23 1602  BP: (!) 140/88  Pulse: 81  Resp: 20  SpO2: 99%  Weight: 183 lb 6.4 oz (83.2 kg)  Height: 5\' 4"   (1.626 m)    Body mass index is 31.48 kg/m.  Wt Readings from Last 3 Encounters:  06/05/23 183 lb 6.4 oz (83.2 kg)  05/07/23 181 lb (82.1 kg)  04/18/23 187 lb (84.8 kg)    General: Well developed, well nourished female in no apparent distress.  HEENT: AT/[place], no external lesions.  Eyes: Conjunctiva clear and no icterus. Neck: Neck supple  Lungs: Respirations not labored Neurologic: Alert, oriented, normal speech Extremities / Skin: Dry. No sores or rashes noted.  Psychiatric: Does not appear depressed or anxious  Diabetic Foot Exam - Simple   No data filed     LABS Reviewed Lab Results  Component Value Date  HGBA1C 6.2 (A) 06/05/2023   HGBA1C 6.5 (A) 03/05/2023   HGBA1C 6.3 12/06/2022   Lab Results  Component Value Date   FRUCTOSAMINE 250 02/05/2022   FRUCTOSAMINE 286 (H) 10/18/2021   FRUCTOSAMINE 182 (L) 04/15/2017   Lab Results  Component Value Date   CHOL 102 11/16/2021   HDL 34 (L) 11/16/2021   LDLCALC 28 11/16/2021   LDLDIRECT 60 11/08/2011   TRIG 199 (H) 11/16/2021   CHOLHDL 3.0 11/16/2021   Lab Results  Component Value Date   MICRALBCREAT 1.0 12/11/2022   Lab Results  Component Value Date   CREATININE 0.95 01/02/2023   Lab Results  Component Value Date   GFR 60.54 12/06/2022    ASSESSMENT / PLAN  1. Type 2 diabetes mellitus without complication, with long-term current use of insulin (HCC)   2. Controlled type 2 diabetes mellitus without complication, with long-term current use of insulin (HCC)   3. Hypothyroidism following radioiodine therapy      Diabetes Mellitus type 2, complicated by no known complications. - Diabetic status / severity: Controlled.  Lab Results  Component Value Date   HGBA1C 6.2 (A) 06/05/2023    - Hemoglobin A1c goal : <7%  - Medications: No change.  Continue Toujeo 36 units daily. Mounjaro 15 mg weekly. Jardiance 10 mg daily. Humalog as needed for high carbohydrate meals 5 to 6 unit with his meal.  -  Home glucose testing: Use freestyle libre 3 CGM and check as needed. - Discussed/ Gave Hypoglycemia treatment plan.  # Consult : not required at this time.   # Annual urine for microalbuminuria/ creatinine ratio, no microalbuminuria currently, continue ACE/ARB /olmesartan. Last  Lab Results  Component Value Date   MICRALBCREAT 1.0 12/11/2022    # Foot check nightly.  # Annual dilated diabetic eye exams.  Following with ophthalmology.  - Diet: Make healthy diabetic food choices - Life style / activity / exercise: Discussed.  2. Blood pressure  -  BP Readings from Last 1 Encounters:  06/05/23 (!) 140/88    - Control is in target.  - No change in current plans.  3. Lipid status / Hyperlipidemia - Last  Lab Results  Component Value Date   LDLCALC 28 11/16/2021   - Continue atorvastatin 10 mg daily. Managed by primary care provider.  # Postablative hypothyroidism -Continue levothyroxine 137 mcg daily.  Diagnoses and all orders for this visit:  Type 2 diabetes mellitus without complication, with long-term current use of insulin (HCC) -     POCT glycosylated hemoglobin (Hb A1C) -     tirzepatide (MOUNJARO) 15 MG/0.5ML Pen; Inject 15 mg into the skin once a week.  Controlled type 2 diabetes mellitus without complication, with long-term current use of insulin (HCC) -     empagliflozin (JARDIANCE) 10 MG TABS tablet; TAKE 1 TABLET(10 MG) BY MOUTH DAILY WITH BREAKFAST  Hypothyroidism following radioiodine therapy -     levothyroxine (SYNTHROID) 137 MCG tablet; Take 1 tablet (137 mcg total) by mouth daily before breakfast.  Other orders -     Blood Glucose Monitoring Suppl DEVI; 1 each by Does not apply route in the morning, at noon, and at bedtime. May substitute to any manufacturer covered by patient's insurance. -     Glucose Blood (BLOOD GLUCOSE TEST STRIPS) STRP; 1 each by In Vitro route in the morning, at noon, and at bedtime. May substitute to any manufacturer covered by  patient's insurance. -     Lancet Device MISC; 1 each  by Does not apply route in the morning, at noon, and at bedtime. May substitute to any manufacturer covered by patient's insurance. -     Lancets Misc. MISC; 1 each by Does not apply route in the morning, at noon, and at bedtime. May substitute to any manufacturer covered by patient's insurance.     DISPOSITION Follow up in clinic in 3 months suggested.   All questions answered and patient verbalized understanding of the plan.  [place] Temeka Pore, MD [facility] Endocrinology [facility] [address] [place], [address] [place], [place] [address] Phone # [phone]  At least part of this note was generated using voice recognition software. Inadvertent word errors may have occurred, which were not recognized during the proofreading process.

## 2023-06-06 ENCOUNTER — Encounter: Payer: Self-pay | Admitting: Endocrinology

## 2023-07-03 ENCOUNTER — Ambulatory Visit
Admission: RE | Admit: 2023-07-03 | Discharge: 2023-07-03 | Disposition: A | Discharge status: Home / Self Care | Payer: No Typology Code available for payment source | Source: Ambulatory Visit | Attending: Nurse Practitioner | Admitting: Nurse Practitioner

## 2023-07-03 DIAGNOSIS — R748 Abnormal levels of other serum enzymes: Secondary | ICD-10-CM

## 2023-07-03 MED ORDER — GADOPICLENOL 0.5 MMOL/ML IV SOLN
9.0000 mL | Freq: Once | INTRAVENOUS | Status: AC | PRN
  Administered 2023-07-03: 9 mL via INTRAVENOUS

## 2023-07-09 ENCOUNTER — Other Ambulatory Visit: Payer: Self-pay

## 2023-07-09 DIAGNOSIS — E119 Type 2 diabetes mellitus without complications: Secondary | ICD-10-CM

## 2023-07-09 MED ORDER — ONETOUCH VERIO VI STRP: ORAL_STRIP | 3 refills | Status: AC

## 2023-07-11 ENCOUNTER — Other Ambulatory Visit: Payer: Self-pay

## 2023-07-24 ENCOUNTER — Telehealth: Payer: Self-pay | Admitting: Neurology

## 2023-07-24 NOTE — Telephone Encounter (Signed)
Patient called back asking if she could just take the Emgality shot early instead of getting a headache cocktail

## 2023-07-24 NOTE — Telephone Encounter (Signed)
Pt called in stating she has had a migraine for 3 days and wants to see if she can come get a headache cocktail? Her rescue medication is not working

## 2023-07-24 NOTE — Telephone Encounter (Signed)
Patient advised Dr.Jaffe is out of the office this afternoon.   Per patient she will go ahead and take the Emgality early. " It is just four days early." "If that do not help I will go to the ED or Urgent care for headache cocktail. "

## 2023-08-11 ENCOUNTER — Telehealth: Payer: Self-pay | Admitting: Neurology

## 2023-08-11 NOTE — Telephone Encounter (Signed)
Pt cld as needs a PA for Rx [employer], Insurance plan changed but not [employer] and the new plan is asking for an Occupational psychologist for [employer] delivery

## 2023-09-04 ENCOUNTER — Ambulatory Visit: Payer: 59 | Admitting: Endocrinology

## 2023-09-23 ENCOUNTER — Telehealth: Payer: Self-pay | Admitting: Neurology

## 2023-09-23 NOTE — Telephone Encounter (Signed)
 Denial for [employer]

## 2023-09-23 NOTE — Telephone Encounter (Signed)
 Emgality Rx refill is needed, PA is not showing authorized. Pt would like a call back

## 2023-09-24 ENCOUNTER — Telehealth: Payer: Self-pay | Admitting: Pharmacy Technician

## 2023-09-24 ENCOUNTER — Other Ambulatory Visit (HOSPITAL_COMMUNITY): Payer: Self-pay

## 2023-09-24 MED ORDER — EMGALITY 120 MG/ML ~~LOC~~ SOAJ: SUBCUTANEOUS | 0 refills | Status: DC

## 2023-09-24 NOTE — Telephone Encounter (Signed)
 Pharmacy Patient Advocate Encounter   Received notification from Pt Calls Messages that prior authorization for [facility] 120MG  is required/requested.   Insurance verification completed.   The patient is insured through [facility] .   Per test claim: PA required; PA submitted to above mentioned insurance via CoverMyMeds Key/confirmation #/EOC [id number] Status is pending

## 2023-09-24 NOTE — Telephone Encounter (Signed)
 PA has been submitted, and telephone encounter has been created. Please see telephone encounter dated 4.2.25.

## 2023-10-08 ENCOUNTER — Other Ambulatory Visit (HOSPITAL_COMMUNITY): Payer: Self-pay

## 2023-10-08 ENCOUNTER — Ambulatory Visit: Payer: 59 | Admitting: Neurology

## 2023-10-08 NOTE — Telephone Encounter (Signed)
 Pharmacy Patient Advocate Encounter  Received notification from [employer] that Prior Authorization for [facility] 120MG  has been APPROVED from 4.2.25 to 4.2.26. Unable to obtain price due to refill too soon rejection, last fill date 3.7.25 next available fill date5.14.25   PA #/Case ID/Reference #: [id number]

## 2023-11-21 ENCOUNTER — Ambulatory Visit

## 2023-11-21 ENCOUNTER — Telehealth: Payer: Self-pay | Admitting: Neurology

## 2023-11-21 DIAGNOSIS — G43009 Migraine without aura, not intractable, without status migrainosus: Secondary | ICD-10-CM | POA: Diagnosis not present

## 2023-11-21 MED ORDER — DIPHENHYDRAMINE HCL 50 MG/ML IJ SOLN
25.0000 mg | Freq: Once | INTRAMUSCULAR | Status: AC
  Administered 2023-11-21: 25 mg via INTRAMUSCULAR

## 2023-11-21 MED ORDER — KETOROLAC TROMETHAMINE 60 MG/2ML IM SOLN
60.0000 mg | Freq: Once | INTRAMUSCULAR | Status: AC
  Administered 2023-11-21: 60 mg via INTRAMUSCULAR

## 2023-11-21 MED ORDER — METOCLOPRAMIDE HCL 5 MG/ML IJ SOLN
10.0000 mg | Freq: Once | INTRAMUSCULAR | Status: AC
  Administered 2023-11-21: 10 mg via INTRAMUSCULAR

## 2023-11-21 NOTE — Telephone Encounter (Signed)
 Patient is here in the office now.Okay per [facility]

## 2023-11-21 NOTE — Telephone Encounter (Signed)
 Pt called in stating she has a bad migraine. She wants to see if she can get a headache cocktail today? She is right down the street and has a driver.

## 2023-12-03 ENCOUNTER — Ambulatory Visit: Admitting: Neurology

## 2023-12-08 NOTE — Progress Notes (Unsigned)
 NEUROLOGY FOLLOW UP OFFICE NOTE  JENAH VANASTEN [id number]  Assessment/Plan:   1. Migraine without aura, without status migrainosus, not intractable - controlled 3.  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   4.  Chronic neck pain with cervical spondylosis and spinal stenosis 5.  Chronic low back pain stable   Migraine prevention:  Emgality  every 4 weeks, zonisamide  50mg  daily Migraine rescue:  She has samples of Ubrelvy.  Will try it again, knowing to take at earliest onset next time.  Due to cerebrovascular disease/history of TIA vs possible hemiplegic migraine, triptans are contraindicated Follow up 6 months.   Total time spent in chart, reviewing ED note, MRIs and face to face with patient:  36 minutes.    Subjective:  Malvika Tung is a 58 year old woman with type 2 diabetes mellitus, hypothyroidism, fibromyalgia and depression who follows up for falls   UPDATE: Migraines have been well controlled.   She had an intractable migraine last week requiring a headache cocktail.  Helped for a few days.  She had the pressure changed on her CPAP machine which helped.  Neck pain has been aggravating it as well.  Neurosurgery did not think surgery was indicated for her cervical spinal stenosis.  She is undergoing trigger point injections and if that doesn't work, plan is for epidural injection.    Current NSAIDS:  none Current analgesics:  Hydrocodone  most days for fibromyalgia but has been treating headaches with them as well Current triptans: none Current ergotamine: None Current anti-emetic: Promethazine  12.5mg  Current muscle relaxants:  baclofen  10mg  TID PRN  Current anti-anxiolytic:  BuSpar  Current  sleep aide: None Current Antihypertensive medications: Would not use beta-blocker due to asthma Current Antidepressant medications:  Cymbalta  60mg  BID, mirtazapine  7.5mg  QHS Current Anticonvulsant medications: zonisamide  50mg  at bedtime, lamotrigine  200mg  QHS Current anti-CGRP:  Emgality   Current Vitamins/Herbal/Supplements: None Current Antihistamines/Decongestants: None Other therapy: None   Caffeine: No Diet: Drinks Exercise: Not routine Depression: Yes; Anxiety: Yes Other pain: Neck pain, fibromyalgia Sleep hygiene: She underwent sleep study and was diagnosed with severe OSA.  She is using a CPAP.  Sleep is improved   HISTORY: Migraines: Onset: She has had history of migraines since her 72s, but they became frequent in 2018. Location:  Varies: back of head, across forehead, temples Quality:  Varies: squeezing, sharp Initial Intensity:  10/10 Aura:  no Prodrome:  no Postdrome:  no Associated symptoms: Nausea, photophobia, phonophobia, scalp tenderness.  She denies associated vomiting, visual disturbance, or unilateral numbness or weakness.  It is not a new thunderclap headache. Initial Duration:  All day (usually lets up in 30 minutes after taking Maxalt , however lately headache returns in 2 hours) Initial Frequency:  Every other day Initial Frequency of abortive medication: every other day Triggers:  None Relieving factors: Sleep Activity:  aggravates   Hemiplegic Migraines: History of recurrent stroke-like episodes with negative imaging.  In 2016, she had an episode of slurred speech with left sided weakness.  In 2019, she had an episode of left sided facial numbness.  In March 2022, she had twitching on the left side of her mouth lasting a second and intermittent slurred speech.  No unilateral numbness, weakness or headache.  CT head unremarkable.  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  Following this event, she had another episode of speech disturbance, trouble saying the correct word.  Admitted to hospital on 11/15/2021 for another episode of slurred speech and nausea.  Also endorsed neck pain and numbness radiating down left arm.  Left leg seemed slightly weak on exam.  Symptoms lasted a couple of hours.  MRI of brain revealed stable chronic small vessel ischemic changes but no acute stroke.  CTA of head and neck showed moderate focal narrowing of right V2 vertebral artery due to mass effect from degenerative bony spurring but no significant intracranial or extracranial stenosis or occlusion.  2D echocardiogram showed EF 70-75%.  LDL was 28.  She had a recent Hgb A1c in April that was 11.  TIA suspected.  In addition to ASA, she was placed on Plavix  for 21 days followed again by ASA monotherapy.  Due to neck pain and reported left sided pain and weakness, she had an MRI of the cervical spine which revealed moderate spinal stenosis at C5-6 but no severe stenosis or cord abnormality.  On 01/02/2023, she had another episode of left sided numbness, involving face but mostly left arm with some weakness as well.  Seen in the ED.  MRI of brain revealed chronic small vessel ischemic changes in the cerebral white matter and pons but no acute findings.  MRI of cervical spine showed spinal stenosis with cord flattening at C4-C5 and C5-C6 but no cord signal abnormality.  Symptoms resolved and she was discharged home.      Past NSAIDS/steroids:  ibuprofen , prednisone  (unable to take0 Past analgesics:  Tylenol , Excedrin Migraine Past abortive triptans:  Sumatriptan  tablet, rizatriptan  Past muscle relaxants:  no Past anti-emetic:  Zofran  Past antihypertensive medications:  no Past antidepressant medications:  sertraline 50mg , nortriptyline  75mg  as needed for sleep (cannot take nightly due to extreme dry mouth), Lexapro  20mg  Past anticonvulsant  medications:  topiramate  immediate release 100mg  (cognitive problems), Topiramate  ER 100 mg (effective), pregablin Past vitamins/Herbal/Supplements:  no Other past therapies:  no   Family history of headache:  cousins   MRI and MRA of head from 03/15/15 were personally reviewed and were unremarkable except for minimal punctate white matter foci.   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  CT cervical spine from 01/09/18 showed cervical spondylosis with degenerative changes greatest at C4-C7 levels with bilateral neural foraminal stenosis, as well as mild multifactorial C5-6 canal stenosis.  She is treated by pain management.  Endorses numbness and tingling in hands.  NCV-EMG of upper extremities in June 2022 revealed mild carpal tunnel syndrome.    Due to ongoing chronic low back pain and intermittent left leg numbness despite physical therapy, she had an MRI of lumbar spine on 05/15/2022, which revealed L4-L5 mild left-greater-than-right neural foraminal narrowing potentially affecting the descending L5 nerve roots.  She was referred to interventional pain management. Never received a call to schedule for an appointment but has since improved.    Recurrent falls: In October 2024, she had 3 falls.  She was walking across the kitchen when she tripped on something and fell.  Then she had two falls this week.  She started feeling herself leaning to the left, so she corrected herself by pulling to the right and she fell.  No dizziness, leg weakness, visual changes.  The second time she fell, she had a  slight headache afterward.  No associated left sided numbness and weakness.    PAST MEDICAL HISTORY: Past Medical History:  Diagnosis Date   Allergy    Anemia    history of   Anxiety    Asthma    Depression    Diabetes mellitus    type 2    Fibromyalgia    Gastric ulcer    GERD (gastroesophageal reflux disease)    Graves disease    H/O therapeutic  radiation    Migraine    Multiple thyroid  nodules    Myofacial muscle pain    Osteoarthritis    Facet hypertrophy with injections   TIA (transient ischemic attack)    02/2015    MEDICATIONS: Current Outpatient Medications on File Prior to Visit  Medication Sig Dispense Refill   albuterol  (PROAIR  HFA) 108 (90 Base) MCG/ACT inhaler Inhale 2 puffs into the lungs every 4 (four) hours as needed for wheezing. 1 each 2   albuterol  (PROVENTIL ) (2.5 MG/3ML) 0.083% nebulizer solution Take 3 mLs by nebulization 4 (four) times daily as needed for wheezing or shortness of breath.   3   ARNUITY ELLIPTA  100 MCG/ACT AEPB USE 1 INHALATION BY MOUTH DAILY 90 each 3   aspirin  EC 81 MG tablet Take 1 tablet (81 mg total) by mouth daily. Swallow whole. 30 tablet 11   atorvastatin  (LIPITOR) 10 MG tablet Take 10 mg by mouth daily.     baclofen  (LIORESAL ) 10 MG tablet Take 1 tablet (10 mg total) by mouth 3 (three) times daily as needed for muscle spasms. 90 each 5   benzonatate  (TESSALON ) 200 MG capsule Take 200 mg by mouth 3 (three) times daily as needed for cough.     Blood Glucose Monitoring Suppl (ONETOUCH VERIO) w/Device KIT Check blood sugar once daily 1 kit 0   Blood Glucose Monitoring Suppl DEVI 1 each by Does not apply route in the morning, at noon, and at bedtime. May substitute to any manufacturer covered by patient's insurance. 1 each 0   DULoxetine  (CYMBALTA ) 60 MG capsule Take 60 mg by mouth 2 (two) times daily.     empagliflozin  (JARDIANCE ) 10 MG TABS tablet TAKE 1 TABLET(10 MG) BY MOUTH DAILY WITH BREAKFAST 90 tablet 3   fexofenadine  (ALLEGRA  ODT) 30 MG disintegrating tablet Take 30 mg by mouth daily.     Galcanezumab -gnlm (EMGALITY ) 120 MG/ML SOAJ INJECT THE CONTENTS OF 1 PEN  SUBCUTANEOUSLY MONTHLY AS  MAINTENANCE DOSE 3 mL 0   Glucose Blood (BLOOD GLUCOSE TEST STRIPS) STRP 1 each by In Vitro route in the morning, at noon, and at bedtime. May substitute to any manufacturer covered by patient's  insurance. 100 each 3   glucose blood (ONETOUCH VERIO) test strip CHECK YOUR BLOOD SUGAR ONCE  DAILY AS DIRECTED (VARY TIME OF  DAY WHEN CHECK, BEFORE 3 MEALS  AND AT BEDTIME) 100 strip 3   HYDROcodone  bit-homatropine (HYDROMET) 5-1.5 MG/5ML syrup      HYDROcodone -acetaminophen  (NORCO/VICODIN) 5-325 MG tablet Take 1 tablet by mouth every 6 (six) hours as needed for severe pain. 5 tablet 0   Insulin  Pen Needle (BD PEN NEEDLE NANO 2ND GEN) 32G X 4 MM MISC USE TO INJECT INSULIN  4 TIMES A DAY 400 each 3   lamoTRIgine  (LAMICTAL ) 150 MG tablet Take 200 mg by mouth at bedtime.     Lancets (ONETOUCH DELICA PLUS LANCET33G) MISC CHECK BLOOD SUGAR ONCE DAILY 100 each 3   levothyroxine  (SYNTHROID ) 137 MCG tablet Take 1 tablet (137  mcg total) by mouth daily before breakfast. 90 tablet 3   linaclotide  (LINZESS ) 290 MCG CAPS capsule Take 290 mcg by mouth daily before breakfast.      Magnesium  250 MG TABS Take 750 mg by mouth at bedtime.     mirtazapine  (REMERON ) 7.5 MG tablet Take 7.5 mg by mouth at bedtime.     montelukast  (SINGULAIR ) 10 MG tablet Take 10 mg by mouth daily.   3   olmesartan-hydrochlorothiazide (BENICAR HCT) 20-12.5 MG tablet Take 1 tablet by mouth daily.     omeprazole  (PRILOSEC OTC) 20 MG tablet Take 20 mg by mouth daily.     promethazine  (PHENERGAN ) 12.5 MG tablet TAKE 1 TABLET BY MOUTH EVERY 6  HOURS AS NEEDED FOR NAUSEA OR  VOMITING 90 tablet 3   tirzepatide  (MOUNJARO ) 15 MG/0.5ML Pen Inject 15 mg into the skin once a week. 6 mL 4   zonisamide  (ZONEGRAN ) 50 MG capsule TAKE 1 CAPSULE BY MOUTH DAILY 90 capsule 3   Current Facility-Administered Medications on File Prior to Visit  Medication Dose Route Frequency Provider Last Rate Last Admin   metoCLOPramide  (REGLAN ) injection 10 mg  10 mg Intramuscular Once Janne Members R, DO        ALLERGIES: Allergies  Allergen Reactions   Prednisone  Other (See Comments)   Gabapentin  Other (See Comments)    unknown   Ergotrate [Ergonovine] Other  (See Comments)    Pt does not ever want   Ibuprofen  Other (See Comments)    Patient has ulcer   Latex Other (See Comments)    Breaks out   Metformin  And Related Other (See Comments)    Dizziness   Yutopar [Ritodrine] Other (See Comments)    Pt does not ever want    FAMILY HISTORY: Family History  Problem Relation Age of Onset   Hypertension Mother    Diabetes Mother    Cancer Mother        rectal   Stroke Father    Heart disease Father 77       MI x5   Hypertension Father    Diabetes Father    Breast cancer Sister        unsure of age , was between 60 and 87   Drug abuse Brother    Alcohol abuse Brother    Drug abuse Brother    Alcohol abuse Brother    Anxiety disorder Neg Hx    Bipolar disorder Neg Hx    Depression Neg Hx       Objective:  Blood pressure 133/82, pulse 86, height 5' 4 (1.626 m), weight 159 lb (72.1 kg), last menstrual period 12/06/2016, SpO2 100%. General: No acute distress.  Patient appears well-groomed.   Head:  Normocephalic/atraumatic   Janne Members, DO  CC: Debborah Fairly, PA-C

## 2023-12-09 ENCOUNTER — Ambulatory Visit (INDEPENDENT_AMBULATORY_CARE_PROVIDER_SITE_OTHER): Admitting: Neurology

## 2023-12-09 ENCOUNTER — Telehealth: Payer: Self-pay

## 2023-12-09 ENCOUNTER — Encounter: Payer: Self-pay | Admitting: Neurology

## 2023-12-09 VITALS — BP 133/82 | HR 86 | Ht 64.0 in | Wt 159.0 lb

## 2023-12-09 DIAGNOSIS — M4802 Spinal stenosis, cervical region: Secondary | ICD-10-CM

## 2023-12-09 DIAGNOSIS — G43009 Migraine without aura, not intractable, without status migrainosus: Secondary | ICD-10-CM

## 2023-12-09 DIAGNOSIS — G43409 Hemiplegic migraine, not intractable, without status migrainosus: Secondary | ICD-10-CM

## 2023-12-09 MED ORDER — ZONISAMIDE 50 MG PO CAPS: 50.0000 mg | ORAL_CAPSULE | Freq: Every day | ORAL | 3 refills | Status: AC

## 2023-12-09 MED ORDER — EMGALITY 120 MG/ML ~~LOC~~ SOAJ: SUBCUTANEOUS | 3 refills | Status: AC

## 2023-12-09 MED ORDER — BACLOFEN 20 MG PO TABS
20.0000 mg | ORAL_TABLET | Freq: Every day | ORAL | 1 refills | Status: AC
Start: 2023-12-09 — End: ?

## 2023-12-09 MED ORDER — UBRELVY 100 MG PO TABS: 1.0000 | ORAL_TABLET | ORAL | 3 refills | Status: AC | PRN

## 2023-12-09 NOTE — Telephone Encounter (Signed)
 PA needed for Ubrelvy.

## 2023-12-09 NOTE — Patient Instructions (Addendum)
 Continue Emgality  every 4 weeks Continue zonisamide  50mg  at bedtime Change baclofen  to 20mg  at bedtime Start Ubrelvy as needed for migraines - take 1 tablet at earliest onset of migraine.  May repeat after 2 hours.  Maximum 2 tablets in 24 hours. Limit use of pain relievers to no more than 9 days out of the month to prevent risk of rebound or medication-overuse headache. Keep headache diary Follow up 6 months.

## 2023-12-10 ENCOUNTER — Other Ambulatory Visit (HOSPITAL_COMMUNITY): Payer: Self-pay

## 2023-12-11 ENCOUNTER — Other Ambulatory Visit (HOSPITAL_COMMUNITY): Payer: Self-pay

## 2023-12-11 ENCOUNTER — Telehealth: Payer: Self-pay | Admitting: Pharmacy Technician

## 2023-12-11 NOTE — Telephone Encounter (Signed)
 Pharmacy Patient Advocate Encounter   Received notification from Pt Calls Messages that prior authorization for UBRELVY 100MG  is required/requested.   Insurance verification completed.   The patient is insured through [facility] .   Per test claim: PA required; PA submitted to above mentioned insurance via CoverMyMeds Key/confirmation #/EOC [facility] Status is pending

## 2023-12-11 NOTE — Telephone Encounter (Signed)
 PA has been submitted, and telephone encounter has been created. Please see telephone encounter dated 6.19.25.

## 2023-12-12 ENCOUNTER — Other Ambulatory Visit (HOSPITAL_COMMUNITY): Payer: Self-pay

## 2023-12-12 NOTE — Telephone Encounter (Signed)
 Pharmacy Patient Advocate Encounter  Received notification from [employer] that Prior Authorization for UBRELVY 100MG  has been APPROVED from 6.19.25 to 6.19.26   PA #/Case ID/Reference #: [id number]

## 2023-12-15 NOTE — Telephone Encounter (Signed)
 Patient advised of approval and the copay cost of Ubrelvy $190 for a 90 day supply    Patient states she can not afford that right now can she have samples.

## 2023-12-16 NOTE — Telephone Encounter (Signed)
 Patient advised of Note below, We can give her samples but if this is not an option going forward, will need to look for another medication. I would like her to contact her insurance about the possible copay for Nurtec for acute migraine treatment.    Patient verbalized understanding. And will like the samples now.

## 2023-12-18 ENCOUNTER — Other Ambulatory Visit (HOSPITAL_COMMUNITY): Payer: Self-pay

## 2023-12-18 ENCOUNTER — Telehealth: Payer: Self-pay

## 2023-12-18 NOTE — Telephone Encounter (Signed)
 Pharmacy Patient Advocate Encounter   Received notification from CoverMyMeds that prior authorization for Ubrelvy 100MG  tablets is required/requested.   Insurance verification completed.   The patient is insured through [facility] .   Per test claim: The current 30** day co-pay is, $0.00.  No PA needed at this time. This test claim was processed through [facility]- copay amounts may vary at other pharmacies due to pharmacy/plan contracts, or as the patient moves through the different stages of their insurance plan.     [employer] pays a MAXIMUM of 16 tablets per 30 days

## 2024-06-15 NOTE — Progress Notes (Deleted)
 "  NEUROLOGY FOLLOW UP OFFICE NOTE  Kendra Hall [id number]  Assessment/Plan:   1. Migraine without aura, without status migrainosus, not intractable - controlled 3.  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   4.  Chronic neck pain with cervical spondylosis and spinal stenosis 5.  Chronic low back pain stable   Migraine prevention:  Emgality  every 4 weeks, zonisamide  50mg  daily Migraine rescue:  Ubrelvy  100mg .  Due to cerebrovascular disease/history of TIA vs possible hemiplegic migraine, triptans are contraindicated Limit use of pain relievers to no more than 9 days out of the month to prevent risk of rebound or medication-overuse headache. Keep headache diary Lifestyle modification Follow up ***      Subjective:  Kendra Hall is a 58 year old woman with type 2 diabetes mellitus, hypothyroidism, fibromyalgia and depression who follows up for falls   UPDATE: Migraines: On Emgality  and zonisamide . *** Intensity:  *** Duration:  *** with Ubrelvy  Frequency:  ***   Cervical spinal stenosis: Neurosurgery did not think surgery was indicated for her cervical spinal stenosis.  She is undergoing trigger point injections and if that doesn't work, plan is for epidural injection.   Current medications: Current NSAIDS:  none Current analgesics:  Hydrocodone  most days for fibromyalgia but has been treating headaches with them as well Current triptans: none Current ergotamine: None Current anti-emetic: Promethazine  12.5mg  Current muscle relaxants:  baclofen  10mg  TID PRN  Current anti-anxiolytic:  BuSpar  Current sleep aide: None Current Antihypertensive medications: Would not use beta-blocker due to  asthma Current Antidepressant medications:  Cymbalta  60mg  BID, mirtazapine  7.5mg  QHS Current Anticonvulsant medications: zonisamide  50mg  at bedtime, lamotrigine  200mg  QHS Current anti-CGRP:  Emgality , Ubrelvy  100mg   Current Vitamins/Herbal/Supplements: None Current Antihistamines/Decongestants: None Other therapy: None   Caffeine: No Diet: Drinks Exercise: Not routine Depression: Yes; Anxiety: Yes Other pain: Neck pain, fibromyalgia Sleep hygiene: She underwent sleep study and was diagnosed with severe OSA.  She is using a CPAP.  Sleep is improved   HISTORY: Migraines: Onset: She has had history of migraines since her 37s, but they became frequent in 2018. Location:  Varies: back of head, across forehead, temples Quality:  Varies: squeezing, sharp Initial Intensity:  10/10 Aura:  no Prodrome:  no Postdrome:  no Associated symptoms: Nausea, photophobia, phonophobia, scalp tenderness.  She denies associated vomiting, visual disturbance, or unilateral numbness or weakness.  It is not a new thunderclap headache. Initial Duration:  All day (usually lets up in 30 minutes after taking Maxalt , however lately headache returns in 2 hours) Initial Frequency:  Every other day Initial Frequency of abortive medication: every other day Triggers:  None Relieving factors: Sleep Activity:  aggravates   Hemiplegic Migraines: History of recurrent stroke-like episodes with negative imaging.  In 2016, she had an episode of slurred speech with left sided weakness.  In 2019, she had an episode of left sided facial numbness.  In March 2022, she had twitching on the left side of her mouth lasting a second and intermittent slurred speech.  No unilateral numbness, weakness or headache.  CT head unremarkable.  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  Following this event, she had  another episode of speech disturbance, trouble saying the correct word.  Admitted to hospital on 11/15/2021 for another episode of slurred speech and nausea.  Also endorsed neck pain and numbness radiating down left arm.  Left leg seemed slightly weak on exam.  Symptoms lasted a couple of hours.  MRI of brain revealed stable chronic small vessel ischemic changes but no acute stroke.  CTA of head and neck showed moderate focal narrowing of right V2 vertebral artery due to mass effect from degenerative bony spurring but no significant intracranial or extracranial stenosis or occlusion.  2D echocardiogram showed EF 70-75%.  LDL was 28.  She had a recent Hgb A1c in April that was 11.  TIA suspected.  In addition to ASA, she was placed on Plavix  for 21 days followed again by ASA monotherapy.  Due to neck pain and reported left sided pain and weakness, she had an MRI of the cervical spine which revealed moderate spinal stenosis at C5-6 but no severe stenosis or cord abnormality.  On 01/02/2023, she had another episode of left sided numbness, involving face but mostly left arm with some weakness as well.  Seen in the ED.  MRI of brain revealed chronic small vessel ischemic changes in the cerebral white matter and pons but no acute findings.  MRI of cervical spine showed spinal stenosis with cord flattening at C4-C5 and C5-C6 but no cord signal abnormality.  Symptoms resolved and she was discharged home.    Cervical spinal stenosis: History of chronic neck pain.  Most recent MRI of cervical spine from 01/02/2023 showed severe spinal stenosis with moderate to severe bilateral neural foraminal narrowing at C5-C6, moderate to severe spinal canal stenosis with severe right and mild-to-moderate left neural foraminal narrowing, mild-to-moderate spinal canal stenosis and mild bilateral neural foraminal narrowing at C3-C4 and mild spinal canal stenosis and moderate bilateral neural foraminal narrowing at C6-C7.    Past  NSAIDS/steroids:  ibuprofen , prednisone  (unable to take0 Past analgesics:  Tylenol , Excedrin Migraine Past abortive triptans:  Sumatriptan  tablet, rizatriptan  Past muscle relaxants:  no Past anti-emetic:  Zofran  Past antihypertensive medications:  no Past antidepressant medications:  sertraline 50mg , nortriptyline  75mg  as needed for sleep (cannot take nightly due to extreme dry mouth), Lexapro  20mg  Past anticonvulsant medications:  topiramate  immediate release 100mg  (cognitive problems), Topiramate  ER 100 mg (effective), pregablin Past vitamins/Herbal/Supplements:  no Other past therapies:  no   Family history of headache:  cousins   MRI and MRA of head from 03/15/15 were personally reviewed and were unremarkable except for minimal punctate white matter foci.   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  CT cervical spine from 01/09/18 showed cervical spondylosis with degenerative changes greatest at C4-C7 levels with bilateral neural foraminal stenosis, as well as mild multifactorial C5-6 canal stenosis.  She is treated by pain management.  Endorses numbness and tingling in hands.  NCV-EMG of upper extremities in June 2022 revealed mild carpal tunnel syndrome.    Due to ongoing chronic low back pain and intermittent left leg numbness despite physical therapy, she had an MRI of lumbar spine on 05/15/2022, which revealed L4-L5 mild left-greater-than-right neural foraminal narrowing potentially affecting the descending L5 nerve roots.  She was referred to interventional pain management. Never received a call to schedule for an appointment but has since improved.    Recurrent falls: In October 2024, she had 3 falls.  She was walking across the kitchen when she tripped on something and fell.  Then she had two falls this week.  She started feeling herself leaning to the left, so she corrected herself by pulling to the right and she fell.  No dizziness, leg  weakness, visual changes.  The second time she fell, she had a slight headache afterward.  No associated left sided numbness and weakness.    PAST MEDICAL HISTORY: Past Medical History:  Diagnosis Date   Allergy    Anemia    history of   Anxiety    Asthma    Depression    Diabetes mellitus    type 2    Fibromyalgia    Gastric ulcer    GERD (gastroesophageal reflux disease)    Graves disease    H/O therapeutic radiation    Migraine    Multiple thyroid  nodules    Myofacial muscle pain    Osteoarthritis    Facet hypertrophy with injections   TIA (transient ischemic attack)    02/2015    MEDICATIONS: Current Outpatient Medications on File Prior to Visit  Medication Sig Dispense Refill   albuterol  (PROAIR  HFA) 108 (90 Base) MCG/ACT inhaler Inhale 2 puffs into the lungs every 4 (four) hours as needed for wheezing. 1 each 2   albuterol  (PROVENTIL ) (2.5 MG/3ML) 0.083% nebulizer solution Take 3 mLs by nebulization 4 (four) times daily as needed for wheezing or shortness of breath.   3   ARNUITY ELLIPTA  100 MCG/ACT AEPB USE 1 INHALATION BY MOUTH DAILY 90 each 3   aspirin  EC 81 MG tablet Take 1 tablet (81 mg total) by mouth daily. Swallow whole. 30 tablet 11   atorvastatin  (LIPITOR) 10 MG tablet Take 10 mg by mouth daily.     baclofen  (LIORESAL ) 20 MG tablet Take 1 tablet (20 mg total) by mouth at bedtime. 90 each 1   benzonatate  (TESSALON ) 200 MG capsule Take 200 mg by mouth 3 (three) times daily as needed for cough.     Blood Glucose Monitoring Suppl (ONETOUCH VERIO) w/Device KIT Check blood sugar once daily 1 kit 0   Blood Glucose Monitoring Suppl DEVI 1 each by Does not apply route in the morning, at noon, and at bedtime. May substitute to any manufacturer covered by patient's insurance. 1 each 0   DULoxetine  (CYMBALTA ) 60 MG capsule Take 60 mg by mouth 2 (two) times daily.     empagliflozin  (JARDIANCE ) 10 MG TABS tablet TAKE 1 TABLET(10 MG) BY MOUTH DAILY WITH BREAKFAST 90 tablet 3    fexofenadine  (ALLEGRA  ODT) 30 MG disintegrating tablet Take 30 mg by mouth daily.     Galcanezumab -gnlm (EMGALITY ) 120 MG/ML SOAJ INJECT THE CONTENTS OF 1 PEN  SUBCUTANEOUSLY MONTHLY AS  MAINTENANCE DOSE 3 mL 3   Glucose Blood (BLOOD GLUCOSE TEST STRIPS) STRP 1 each by In Vitro route in the morning, at noon, and at bedtime. May substitute to any manufacturer covered by patient's insurance. 100 each 3   glucose blood (ONETOUCH VERIO) test strip CHECK YOUR BLOOD SUGAR ONCE  DAILY AS DIRECTED (VARY TIME OF  DAY WHEN CHECK, BEFORE 3 MEALS  AND AT BEDTIME) 100 strip 3   HYDROcodone  bit-homatropine (HYDROMET) 5-1.5 MG/5ML syrup      HYDROcodone -acetaminophen  (NORCO/VICODIN) 5-325 MG tablet Take 1 tablet by mouth every 6 (six) hours as needed for severe pain. 5 tablet 0   lamoTRIgine  (LAMICTAL ) 150 MG tablet Take 200 mg by mouth at bedtime.     Lancets (ONETOUCH DELICA PLUS LANCET33G) MISC CHECK BLOOD SUGAR ONCE DAILY 100 each 3  levothyroxine  (SYNTHROID ) 137 MCG tablet Take 1 tablet (137 mcg total) by mouth daily before breakfast. 90 tablet 3   linaclotide  (LINZESS ) 290 MCG CAPS capsule Take 290 mcg by mouth daily before breakfast.      Magnesium  250 MG TABS Take 750 mg by mouth at bedtime.     mirtazapine  (REMERON ) 7.5 MG tablet Take 7.5 mg by mouth at bedtime.     montelukast  (SINGULAIR ) 10 MG tablet Take 10 mg by mouth daily.   3   olmesartan-hydrochlorothiazide (BENICAR HCT) 20-12.5 MG tablet Take 1 tablet by mouth daily.     omeprazole  (PRILOSEC OTC) 20 MG tablet Take 20 mg by mouth daily.     promethazine  (PHENERGAN ) 12.5 MG tablet TAKE 1 TABLET BY MOUTH EVERY 6  HOURS AS NEEDED FOR NAUSEA OR  VOMITING 90 tablet 3   tirzepatide  (MOUNJARO ) 15 MG/0.5ML Pen Inject 15 mg into the skin once a week. 6 mL 4   Ubrogepant  (UBRELVY ) 100 MG TABS Take 1 tablet (100 mg total) by mouth as needed. May repeat after 2 hours.  Maximum 2 tablets in 24 hours. 30 tablet 3   zonisamide  (ZONEGRAN ) 50 MG capsule Take  1 capsule (50 mg total) by mouth daily. 90 capsule 3   Current Facility-Administered Medications on File Prior to Visit  Medication Dose Route Frequency Provider Last Rate Last Admin   metoCLOPramide  (REGLAN ) injection 10 mg  10 mg Intramuscular Once Skeet Cornet R, DO        ALLERGIES: Allergies  Allergen Reactions   Prednisone  Other (See Comments)   Gabapentin  Other (See Comments)    unknown   Ergotrate [Ergonovine] Other (See Comments)    Pt does not ever want   Ibuprofen  Other (See Comments)    Patient has ulcer   Latex Other (See Comments)    Breaks out   Metformin  And Related Other (See Comments)    Dizziness   Yutopar [Ritodrine] Other (See Comments)    Pt does not ever want    FAMILY HISTORY: Family History  Problem Relation Age of Onset   Hypertension Mother    Diabetes Mother    Cancer Mother        rectal   Stroke Father    Heart disease Father 4       MI x5   Hypertension Father    Diabetes Father    Breast cancer Sister        unsure of age , was between 87 and 29   Drug abuse Brother    Alcohol abuse Brother    Drug abuse Brother    Alcohol abuse Brother    Anxiety disorder Neg Hx    Bipolar disorder Neg Hx    Depression Neg Hx       Objective:  *** General: No acute distress.  Patient appears well-groomed.   Head:  Normocephalic/atraumatic Head:  Normocephalic/atraumatic Neck:  Supple.  No paraspinal tenderness.  Full range of motion. Heart:  Regular rate and rhythm. Neuro:  Alert and oriented.  Speech fluent and not dysarthric.  Language intact.  CN II-XII intact.  Bulk and tone normal.  Muscle strength 5/5 throughout.  Sensation to light touch intact.  Deep tendon reflexes 2+ throughout, toes downgoing.  Gait normal.  Romberg negative.   Cornet Skeet, DO  CC: Carola Mace, PA-C       "

## 2024-06-16 ENCOUNTER — Encounter: Payer: Self-pay | Admitting: Neurology

## 2024-06-16 ENCOUNTER — Ambulatory Visit: Admitting: Neurology

## 2024-06-16 DIAGNOSIS — Z029 Encounter for administrative examinations, unspecified: Secondary | ICD-10-CM
